# Patient Record
Sex: Female | Born: 1970
Health system: Southern US, Community
[De-identification: ages and names within clinical notes are randomized; demographics above are authoritative.]

## PROBLEM LIST (undated history)

## (undated) DIAGNOSIS — E785 Hyperlipidemia, unspecified: Secondary | ICD-10-CM

## (undated) DIAGNOSIS — F419 Anxiety disorder, unspecified: Secondary | ICD-10-CM

## (undated) DIAGNOSIS — R0602 Shortness of breath: Secondary | ICD-10-CM

## (undated) DIAGNOSIS — G8929 Other chronic pain: Secondary | ICD-10-CM

## (undated) DIAGNOSIS — D649 Anemia, unspecified: Secondary | ICD-10-CM

## (undated) DIAGNOSIS — F319 Bipolar disorder, unspecified: Secondary | ICD-10-CM

## (undated) DIAGNOSIS — K219 Gastro-esophageal reflux disease without esophagitis: Secondary | ICD-10-CM

## (undated) DIAGNOSIS — G43909 Migraine, unspecified, not intractable, without status migrainosus: Secondary | ICD-10-CM

## (undated) DIAGNOSIS — G5603 Carpal tunnel syndrome, bilateral upper limbs: Secondary | ICD-10-CM

## (undated) DIAGNOSIS — G4730 Sleep apnea, unspecified: Secondary | ICD-10-CM

## (undated) DIAGNOSIS — I739 Peripheral vascular disease, unspecified: Secondary | ICD-10-CM

## (undated) DIAGNOSIS — G4700 Insomnia, unspecified: Secondary | ICD-10-CM

## (undated) DIAGNOSIS — F488 Other specified nonpsychotic mental disorders: Secondary | ICD-10-CM

## (undated) DIAGNOSIS — G4733 Obstructive sleep apnea (adult) (pediatric): Secondary | ICD-10-CM

## (undated) DIAGNOSIS — M797 Fibromyalgia: Secondary | ICD-10-CM

## (undated) DIAGNOSIS — M1990 Unspecified osteoarthritis, unspecified site: Secondary | ICD-10-CM

## (undated) DIAGNOSIS — H269 Unspecified cataract: Secondary | ICD-10-CM

## (undated) DIAGNOSIS — I5023 Acute on chronic systolic (congestive) heart failure: Secondary | ICD-10-CM

## (undated) DIAGNOSIS — F329 Major depressive disorder, single episode, unspecified: Secondary | ICD-10-CM

## (undated) DIAGNOSIS — S21002A Unspecified open wound of left breast, initial encounter: Secondary | ICD-10-CM

## (undated) DIAGNOSIS — I10 Essential (primary) hypertension: Secondary | ICD-10-CM

## (undated) DIAGNOSIS — Z9884 Bariatric surgery status: Secondary | ICD-10-CM

## (undated) DIAGNOSIS — F32A Depression, unspecified: Secondary | ICD-10-CM

## (undated) DIAGNOSIS — G629 Polyneuropathy, unspecified: Secondary | ICD-10-CM

## (undated) DIAGNOSIS — I428 Other cardiomyopathies: Secondary | ICD-10-CM

## (undated) DIAGNOSIS — M549 Dorsalgia, unspecified: Secondary | ICD-10-CM

## (undated) HISTORY — DX: Unspecified cataract: H26.9

## (undated) HISTORY — DX: Hyperlipidemia, unspecified: E78.5

## (undated) HISTORY — PX: BREAST CYST ASPIRATION: SHX578

## (undated) HISTORY — PX: CARPAL TUNNEL RELEASE: SHX101

## (undated) HISTORY — DX: Obstructive sleep apnea (adult) (pediatric): G47.33

## (undated) HISTORY — PX: BREAST SURGERY: SHX581

## (undated) HISTORY — DX: Unspecified osteoarthritis, unspecified site: M19.90

## (undated) HISTORY — DX: Sleep apnea, unspecified: G47.30

## (undated) HISTORY — DX: Migraine, unspecified, not intractable, without status migrainosus: G43.909

## (undated) HISTORY — PX: CARDIAC CATHETERIZATION: SHX172

## (undated) HISTORY — DX: Gastro-esophageal reflux disease without esophagitis: K21.9

## (undated) HISTORY — PX: CATARACT EXTRACTION: SUR2

---

## 1991-04-16 HISTORY — PX: BACK SURGERY: SHX140

## 1996-06-15 HISTORY — PX: CHOLECYSTECTOMY: SHX55

## 1997-06-15 HISTORY — PX: TUBAL LIGATION: SHX77

## 1997-11-16 ENCOUNTER — Inpatient Hospital Stay (HOSPITAL_COMMUNITY): Admission: AD | Admit: 1997-11-16 | Discharge: 1997-11-18 | Payer: Self-pay | Admitting: Family Medicine

## 1997-11-27 ENCOUNTER — Emergency Department (HOSPITAL_COMMUNITY): Admission: EM | Admit: 1997-11-27 | Discharge: 1997-11-27 | Payer: Self-pay | Admitting: Emergency Medicine

## 1997-11-30 ENCOUNTER — Ambulatory Visit (HOSPITAL_COMMUNITY): Admission: RE | Admit: 1997-11-30 | Discharge: 1997-11-30 | Payer: Self-pay | Admitting: Family Medicine

## 1997-12-04 ENCOUNTER — Ambulatory Visit (HOSPITAL_COMMUNITY): Admission: RE | Admit: 1997-12-04 | Discharge: 1997-12-04 | Payer: Self-pay | Admitting: Family Medicine

## 1997-12-06 ENCOUNTER — Encounter: Admission: RE | Admit: 1997-12-06 | Discharge: 1997-12-06 | Payer: Self-pay | Admitting: Family Medicine

## 1997-12-12 ENCOUNTER — Other Ambulatory Visit: Admission: RE | Admit: 1997-12-12 | Discharge: 1997-12-12 | Payer: Self-pay | Admitting: Family Medicine

## 1998-01-30 ENCOUNTER — Ambulatory Visit (HOSPITAL_COMMUNITY): Admission: RE | Admit: 1998-01-30 | Discharge: 1998-01-31 | Payer: Self-pay | Admitting: *Deleted

## 1998-03-13 ENCOUNTER — Encounter: Admission: RE | Admit: 1998-03-13 | Discharge: 1998-06-11 | Payer: Self-pay | Admitting: Internal Medicine

## 1998-07-17 ENCOUNTER — Ambulatory Visit (HOSPITAL_COMMUNITY): Admission: RE | Admit: 1998-07-17 | Discharge: 1998-07-17 | Payer: Self-pay | Admitting: *Deleted

## 1999-01-16 ENCOUNTER — Other Ambulatory Visit: Admission: RE | Admit: 1999-01-16 | Discharge: 1999-01-16 | Payer: Self-pay | Admitting: Family Medicine

## 2000-03-04 ENCOUNTER — Other Ambulatory Visit: Admission: RE | Admit: 2000-03-04 | Discharge: 2000-03-04 | Payer: Self-pay | Admitting: Family Medicine

## 2000-03-08 ENCOUNTER — Encounter: Payer: Self-pay | Admitting: Emergency Medicine

## 2000-03-08 ENCOUNTER — Emergency Department (HOSPITAL_COMMUNITY): Admission: EM | Admit: 2000-03-08 | Discharge: 2000-03-08 | Payer: Self-pay | Admitting: Emergency Medicine

## 2000-08-08 ENCOUNTER — Emergency Department (HOSPITAL_COMMUNITY): Admission: EM | Admit: 2000-08-08 | Discharge: 2000-08-08 | Payer: Self-pay | Admitting: Emergency Medicine

## 2001-03-10 ENCOUNTER — Inpatient Hospital Stay (HOSPITAL_COMMUNITY): Admission: AD | Admit: 2001-03-10 | Discharge: 2001-03-11 | Payer: Self-pay | Admitting: Family Medicine

## 2001-04-05 ENCOUNTER — Other Ambulatory Visit: Admission: RE | Admit: 2001-04-05 | Discharge: 2001-04-05 | Payer: Self-pay | Admitting: Family Medicine

## 2001-06-23 ENCOUNTER — Emergency Department (HOSPITAL_COMMUNITY): Admission: EM | Admit: 2001-06-23 | Discharge: 2001-06-24 | Payer: Self-pay | Admitting: Emergency Medicine

## 2001-07-14 ENCOUNTER — Emergency Department (HOSPITAL_COMMUNITY): Admission: EM | Admit: 2001-07-14 | Discharge: 2001-07-14 | Payer: Self-pay | Admitting: Emergency Medicine

## 2001-12-19 ENCOUNTER — Emergency Department (HOSPITAL_COMMUNITY): Admission: EM | Admit: 2001-12-19 | Discharge: 2001-12-19 | Payer: Self-pay

## 2001-12-22 ENCOUNTER — Emergency Department (HOSPITAL_COMMUNITY): Admission: EM | Admit: 2001-12-22 | Discharge: 2001-12-22 | Payer: Self-pay | Admitting: Emergency Medicine

## 2002-02-02 ENCOUNTER — Ambulatory Visit (HOSPITAL_COMMUNITY): Admission: RE | Admit: 2002-02-02 | Discharge: 2002-02-02 | Payer: Self-pay | Admitting: *Deleted

## 2002-03-20 ENCOUNTER — Encounter: Payer: Self-pay | Admitting: Emergency Medicine

## 2002-03-20 ENCOUNTER — Emergency Department (HOSPITAL_COMMUNITY): Admission: EM | Admit: 2002-03-20 | Discharge: 2002-03-20 | Payer: Self-pay | Admitting: Emergency Medicine

## 2011-01-20 NOTE — Progress Notes (Deleted)
  Patient was seen on 01/21/11 for the first of a series of three diabetes self-management courses at [facility]. The following learning objectives were met by the patient during this course:   Defines diabetes and the role of insulin  Identifies type of diabetes and pathophysiology  States normal BG range and personal goals  Identifies three risk factors for the development of diabetes  States the need for and frequency of healthcare follow up (ADA Standards of Care)  No results found for this basename: HGBA1C    Patient has established the following initial goals:  ***  ***  ***  Follow-Up Plan: ***  

## 2011-02-02 ENCOUNTER — Encounter: Payer: 59 | Attending: Family Medicine | Admitting: *Deleted

## 2011-02-02 DIAGNOSIS — E119 Type 2 diabetes mellitus without complications: Secondary | ICD-10-CM | POA: Insufficient documentation

## 2011-02-02 DIAGNOSIS — Z713 Dietary counseling and surveillance: Secondary | ICD-10-CM | POA: Insufficient documentation

## 2011-02-03 NOTE — Patient Instructions (Signed)
Patient will attend Core Diabetes Courses as scheduled or follow up prn.  

## 2011-02-03 NOTE — Progress Notes (Signed)
  Patient was seen on 02/02/11 for the first of a series of three diabetes self-management courses at the [facility]. The following learning objectives were met by the patient during this course:   Defines diabetes and the role of insulin  Identifies type of diabetes and pathophysiology  States normal BG range and personal goals  Identifies three risk factors for the development of diabetes  States the need for and frequency of healthcare follow up (ADA Standards of Care)   Patient has established the following initial goals:  Increase exercise  Lose weight  Follow DM meal plan  Follow-Up Plan: Attend core diabetes classes @ [facility]

## 2011-02-24 ENCOUNTER — Ambulatory Visit: Payer: Medicaid Other

## 2011-02-25 ENCOUNTER — Other Ambulatory Visit: Payer: Self-pay | Admitting: Neurosurgery

## 2011-02-25 DIAGNOSIS — M549 Dorsalgia, unspecified: Secondary | ICD-10-CM

## 2011-02-25 DIAGNOSIS — M542 Cervicalgia: Secondary | ICD-10-CM

## 2011-02-25 DIAGNOSIS — M5410 Radiculopathy, site unspecified: Secondary | ICD-10-CM

## 2011-02-26 ENCOUNTER — Encounter: Payer: Medicare PPO | Attending: Family Medicine

## 2011-02-26 DIAGNOSIS — Z713 Dietary counseling and surveillance: Secondary | ICD-10-CM | POA: Insufficient documentation

## 2011-02-26 DIAGNOSIS — E119 Type 2 diabetes mellitus without complications: Secondary | ICD-10-CM | POA: Insufficient documentation

## 2011-02-27 NOTE — Progress Notes (Signed)
  Patient was seen on 02/26/11 for the second of a series of three diabetes self-management courses at the [facility]. The following learning objectives were met by the patient during this course:   States the relationship of exercise to blood glucose  States benefits/barriers of regular and safe exercise  States three guidelines for safe and effective exercise  Describes personal diabetes medicine regimen  Describes actions of own medications  Describes causes, symptoms, and treatment of hypo/hyperglycemia  Describes sick day rules  Identifies when to test urine for ketones when appropriate  Identifies when to call healthcare provider for acute complications  States the risk for problems with foot, skin, and dental care  States preventative foot, skin, and dental care measures  States when to call healthcare provider regarding foot, skin, and dental care  Identifies methods for evaluation of diabetes control  Discusses benefits of SBGM  Identifies relationship between nutrition, exercise, medication, and glucose levels  Discusses the importance of record keeping  *Patient received [facility] Core Program Notebook at class.  Follow-Up Plan: Patient will attend the final class of the ADA Diabetes Self-Care Education.   

## 2011-03-02 ENCOUNTER — Ambulatory Visit
Admission: RE | Admit: 2011-03-02 | Discharge: 2011-03-02 | Disposition: A | Discharge status: Home / Self Care | Payer: Medicaid Other | Source: Ambulatory Visit | Attending: Neurosurgery | Admitting: Neurosurgery

## 2011-03-02 ENCOUNTER — Ambulatory Visit
Admission: RE | Admit: 2011-03-02 | Discharge: 2011-03-02 | Disposition: A | Discharge status: Home / Self Care | Payer: 59 | Source: Ambulatory Visit | Attending: Neurosurgery | Admitting: Neurosurgery

## 2011-03-02 DIAGNOSIS — M549 Dorsalgia, unspecified: Secondary | ICD-10-CM

## 2011-03-02 DIAGNOSIS — M542 Cervicalgia: Secondary | ICD-10-CM

## 2011-03-02 DIAGNOSIS — M5410 Radiculopathy, site unspecified: Secondary | ICD-10-CM

## 2011-03-02 MED ORDER — ONDANSETRON HCL 4 MG/2ML IJ SOLN
4.0000 mg | Freq: Once | INTRAMUSCULAR | Status: AC
  Administered 2011-03-02: 4 mg via INTRAMUSCULAR

## 2011-03-02 MED ORDER — IOHEXOL 300 MG/ML  SOLN
9.0000 mL | Freq: Once | INTRAMUSCULAR | Status: AC | PRN
  Administered 2011-03-02: 9 mL via INTRATHECAL

## 2011-03-02 MED ORDER — MEPERIDINE HCL 100 MG/ML IJ SOLN
75.0000 mg | Freq: Once | INTRAMUSCULAR | Status: AC
  Administered 2011-03-02: 75 mg via INTRAMUSCULAR

## 2011-03-02 MED ORDER — DIAZEPAM 2 MG PO TABS
10.0000 mg | ORAL_TABLET | Freq: Once | ORAL | Status: AC
  Administered 2011-03-02: 10 mg via ORAL

## 2011-03-03 ENCOUNTER — Ambulatory Visit: Payer: Medicaid Other

## 2011-03-03 ENCOUNTER — Telehealth: Payer: Self-pay | Admitting: Diagnostic Radiology

## 2011-03-03 NOTE — Telephone Encounter (Signed)
Pt called to c/o backache, headache and nausea post myelo. Explained she needed to continue bedrest, try benadryl or dramamine for nausea, ginger ale and soda crackers as well. Use ice to her back for discomfort and may start taking tramadol later today.

## 2011-03-05 ENCOUNTER — Encounter: Payer: Medicare PPO | Admitting: *Deleted

## 2011-03-05 NOTE — Progress Notes (Signed)
  Patient was seen on 03/05/2011 for the third of a series of three diabetes self-management courses at the [facility]. The following learning objectives were met by the patient during this course:   Identifies nutrient effects on glycemia  States the general guidelines of meal planning  Relates understanding of personal meal plan  Describes situations that cause stress and discuss methods of stress management  Identifies lifestyle behaviors for change  The following handouts were given in class:  [employer] Carbohydrate Counting book  3 Month Follow Up Visit handout  Goal setting handout  Class evaluation form  Your patient has established the following 3 month goals for diabetes self-care:  Eat meals and snacks on time  Walk/bike/swim/aerobic exercise for 30 minutes 3 times / week    Follow-Up Plan: Patient will attend a 3 month follow-up visit for diabetes self-management education.

## 2011-03-20 ENCOUNTER — Other Ambulatory Visit (INDEPENDENT_AMBULATORY_CARE_PROVIDER_SITE_OTHER): Payer: Self-pay | Admitting: General Surgery

## 2011-03-20 ENCOUNTER — Encounter (INDEPENDENT_AMBULATORY_CARE_PROVIDER_SITE_OTHER): Payer: Self-pay | Admitting: General Surgery

## 2011-03-20 ENCOUNTER — Ambulatory Visit (INDEPENDENT_AMBULATORY_CARE_PROVIDER_SITE_OTHER): Payer: 59 | Admitting: General Surgery

## 2011-03-20 DIAGNOSIS — E118 Type 2 diabetes mellitus with unspecified complications: Secondary | ICD-10-CM

## 2011-03-20 DIAGNOSIS — E1165 Type 2 diabetes mellitus with hyperglycemia: Secondary | ICD-10-CM

## 2011-03-20 DIAGNOSIS — M549 Dorsalgia, unspecified: Secondary | ICD-10-CM

## 2011-03-20 DIAGNOSIS — F329 Major depressive disorder, single episode, unspecified: Secondary | ICD-10-CM

## 2011-03-20 DIAGNOSIS — J45909 Unspecified asthma, uncomplicated: Secondary | ICD-10-CM

## 2011-03-20 DIAGNOSIS — K219 Gastro-esophageal reflux disease without esophagitis: Secondary | ICD-10-CM

## 2011-03-20 LAB — CBC WITH DIFFERENTIAL/PLATELET
Lymphocytes Relative: 25 % (ref 12–46)
Lymphs Abs: 2.2 10*3/uL (ref 0.7–4.0)
MCV: 82.9 fL (ref 78.0–100.0)
Neutrophils Relative %: 67 % (ref 43–77)
Platelets: 343 10*3/uL (ref 150–400)
RBC: 4.45 MIL/uL (ref 3.87–5.11)
WBC: 8.6 10*3/uL (ref 4.0–10.5)

## 2011-03-20 LAB — COMPREHENSIVE METABOLIC PANEL
ALT: 51 U/L — ABNORMAL HIGH (ref 0–35)
CO2: 25 mEq/L (ref 19–32)
Calcium: 10.3 mg/dL (ref 8.4–10.5)
Chloride: 99 mEq/L (ref 96–112)
Sodium: 137 mEq/L (ref 135–145)
Total Protein: 7.6 g/dL (ref 6.0–8.3)

## 2011-03-20 LAB — T4: T4, Total: 10.6 ug/dL (ref 5.0–12.5)

## 2011-03-20 LAB — LIPID PANEL: LDL Cholesterol: 117 mg/dL — ABNORMAL HIGH (ref 0–99)

## 2011-03-20 NOTE — Patient Instructions (Signed)
Call for any questions regarding her preoperative evaluation

## 2011-03-20 NOTE — Progress Notes (Signed)
Subjective:   Morbid obesity  Patient ID: [name], female   DOB: 1970/09/09, 40 y.o.   MRN: [id number]  HPI The patient is a 40 year old female here through the courtesy of Dr. Nathanial Rancher or consideration for surgical treatment for morbid obesity. The patient gives a many year history of struggling with her weight. She describes multiple efforts at nonsurgical weight loss including diet exercise and medications. She has had some temporary success of 30 pounds at a time but then will experience weight regain. She has developed multiple significant medical problems related to her weight the most significant of which is insulin-dependent diabetes which has been brittle and difficult to control. She is very concerned about her long-term health and being around for her son. She has been to our initial information seminar and researched her options and is interested in gastric bypass.  Past Medical History  Diagnosis Date  . Arthritis   . Asthma   . GERD (gastroesophageal reflux disease)   . Diabetes mellitus   . Hyperlipidemia   . Migraines    Past Surgical History  Procedure Date  . Back surgery November 1992  . Cesarean section December 1998  . Tubal ligation January 1999  . Cholecystectomy 1998   Current Outpatient Prescriptions  Medication Sig Dispense Refill  . albuterol (PROVENTIL) (2.5 MG/3ML) 0.083% nebulizer solution Take 2.5 mg by nebulization every 6 (six) hours as needed.        . Alcohol Swabs (B-D SINGLE USE SWABS REGULAR) PADS       . baclofen (LIORESAL) 10 MG tablet Take 10 mg by mouth 3 (three) times daily as needed.        . BD ULTRA-FINE PEN NEEDLES 29G X 12.7MM MISC       . CLEVER CHEK TEST test strip       . cyclobenzaprine (FLEXERIL) 10 MG tablet Take 10 mg by mouth 3 (three) times daily as needed.        . DULoxetine (CYMBALTA) 60 MG capsule Take 60 mg by mouth daily.        Marland Kitchen exenatide (BYETTA) 5 MCG/0.02ML SOLN Inject into the skin 2 (two) times daily with a  meal.        . fish oil-omega-3 fatty acids 1000 MG capsule Take 2 g by mouth daily.        Marland Kitchen ibuprofen (ADVIL,MOTRIN) 800 MG tablet       . insulin glargine (LANTUS) 100 UNIT/ML injection Inject 75 Units into the skin at bedtime.        . Lancets MISC       . lisinopril-hydrochlorothiazide (PRINZIDE,ZESTORETIC) 20-12.5 MG per tablet Take 1 tablet by mouth daily.        Marland Kitchen LOVAZA 1 G capsule       . metroNIDAZOLE (FLAGYL) 500 MG tablet       . montelukast (SINGULAIR) 10 MG tablet Take 10 mg by mouth at bedtime.        . montelukast (SINGULAIR) 10 MG tablet       . omeprazole (PRILOSEC) 10 MG capsule Take 10 mg by mouth daily.        . phentermine 37.5 MG capsule Take 37.5 mg by mouth every morning.        . potassium chloride (K-DUR,KLOR-CON) 10 MEQ tablet Take 10 mEq by mouth 2 (two) times daily.        . potassium chloride (KLOR-CON) 10 MEQ CR tablet Take 10 mEq by mouth daily.        Marland Kitchen  traMADol (ULTRAM) 50 MG tablet        No Known Allergies History  Substance Use Topics  . Smoking status: Never Smoker   . Smokeless tobacco: Never Used  . Alcohol Use: No    Review of Systems  Constitutional: Negative.   HENT: Negative.   Eyes: Positive for visual disturbance.  Respiratory: Positive for cough, choking and shortness of breath.   Cardiovascular: Negative.   Gastrointestinal: Negative for nausea, vomiting, abdominal pain, diarrhea and constipation.  Genitourinary: Negative for difficulty urinating.  Musculoskeletal: Positive for back pain and arthralgias.  Neurological:       Neuropathy feet  Psychiatric/Behavioral: Negative.        Objective:   Physical Exam General: Obese African American female in no acute distress Skin: Warm and dry without rash or infection HEENT: No palpable masses or thyromegaly. Pupils equal. Sclerae nonicteric. Oropharynx clear. Lymph nodes: The cervical, supraclavicular nodes palpable Lungs: Clear without wheezing or increased work of  breathing Cardiovascular: Regular rate and rhythm without murmur. No edema. Peripheral pulses intact. Abdomen: Obese, soft and nontender. Well-healed laparoscopic incisions. No hernias. No discernible masses or organomegaly. Extremities:: Some mild atrophy of the skin the lower extremities. No edema or joint deformity. Neurologic: Alert, fulgurated, gait normal.    Assessment:     40 year old female with morbid obesity unresponsive to multiple efforts at medical management. She presents at a BMI of 36.9 and multiple comorbidities including brittle insulin-dependent diabetes mellitus, chronic back and joint pain, hyperlipidemia and GERD. Please she has significant potential benefits of from weight loss surgery. I believe that gastric bypass would be preferable for her due to her brittle diabetes and this is the operation that she had chosen on her own. We discussed the procedure in detail including expected results, risks of bleeding leak, infection, blood clots, and rare risk of death. We discussed long-term risks of stenosis, ulcer, intestinal blockage, nutritional deficiencies. She was given a complete consent form reviewed.    Plan:     We'll proceed with preoperative cytologic and nutrition evaluation, blood work, H. pylori testing. See her back following these studies.

## 2011-03-21 LAB — HEMOGLOBIN A1C: Hgb A1c MFr Bld: 12.3 % — ABNORMAL HIGH (ref <5.7)

## 2011-03-30 ENCOUNTER — Encounter: Payer: Medicare PPO | Attending: Family Medicine | Admitting: *Deleted

## 2011-03-30 ENCOUNTER — Encounter: Payer: Self-pay | Admitting: *Deleted

## 2011-03-30 DIAGNOSIS — E119 Type 2 diabetes mellitus without complications: Secondary | ICD-10-CM | POA: Insufficient documentation

## 2011-03-30 DIAGNOSIS — Z713 Dietary counseling and surveillance: Secondary | ICD-10-CM | POA: Insufficient documentation

## 2011-03-30 NOTE — Patient Instructions (Signed)
   Follow Pre-Op Nutrition Goals to prepare for Gastric Bypass Surgery.   Call the [facility] at [phone] once you have been given your surgery date to enrolled in the Pre-Op Nutrition Class. You will need to attend this nutrition class 3-4 weeks prior to your surgery. 

## 2011-03-30 NOTE — Progress Notes (Signed)
  Patient was seen on 03/30/2011 from 0905 to 1002 for Pre-Operative Gastric Bypass Nutrition Assessment. Assessment and letter of approval faxed to [facility] coordinator on 03/30/2011 and sent to the [facility] for documentation in the chart.    Handouts given during visit include:  Pre-Op Goals Handout  Patient to call for Pre-Op and Post-Op Nutrition Education at the [facility] when surgery is scheduled.

## 2011-03-31 ENCOUNTER — Ambulatory Visit (HOSPITAL_COMMUNITY)
Admission: RE | Admit: 2011-03-31 | Discharge: 2011-03-31 | Disposition: A | Discharge status: Home / Self Care | Payer: Medicare PPO | Source: Ambulatory Visit | Attending: General Surgery | Admitting: General Surgery

## 2011-03-31 ENCOUNTER — Other Ambulatory Visit: Payer: Self-pay

## 2011-03-31 ENCOUNTER — Ambulatory Visit (HOSPITAL_COMMUNITY)
Admission: RE | Admit: 2011-03-31 | Discharge: 2011-03-31 | Disposition: A | Discharge status: Home / Self Care | Payer: 59 | Source: Ambulatory Visit | Attending: General Surgery | Admitting: General Surgery

## 2011-03-31 DIAGNOSIS — Z01818 Encounter for other preprocedural examination: Secondary | ICD-10-CM | POA: Insufficient documentation

## 2011-03-31 DIAGNOSIS — Z13820 Encounter for screening for osteoporosis: Secondary | ICD-10-CM | POA: Insufficient documentation

## 2011-05-22 ENCOUNTER — Other Ambulatory Visit (INDEPENDENT_AMBULATORY_CARE_PROVIDER_SITE_OTHER): Payer: Self-pay | Admitting: General Surgery

## 2011-06-03 NOTE — Patient Instructions (Signed)
Follow:    Pre-Op Diet per MD 2 weeks prior to surgery  Phase 2- Liquids (clear/full) 2 weeks after surgery  Vitamin/Mineral/Calcium guidelines for purchasing bariatric supplements  Exercise guidelines pre and post-op per MD  Follow-up at [facility] in 2 weeks post-op for diet advancement. Contact Ramaya Guile as needed with questions/concerns.  

## 2011-06-03 NOTE — Progress Notes (Signed)
  Bariatric Class:  Appt start time: 1700 end time:  1800.  Pre-Operative Nutrition Class  Patient was seen on 06/04/11 for Pre-Operative Nutrition education at the [facility].   Surgery date: 06/30/11 Surgery type: Gastric Bypass  Weight today: 223.8lb Weight change: 1.7 lb gain Total weight lost: n/a BMI: 36.7  Samples given per MNT protocol:  Bariatric Advantage Multivitamin  Lot # [id number]  Exp: 9/13   Bariatric Advantage Calcium Citrate  Lot # [id number]  Exp: 12/13   Celebrate VitaminsCalcium Citrate  Lot # [id number]  Exp: 4/13   Unjury Protein - Chicken Soup  Lot # [id number]  Exp: 8/13   The following the learning objective met by the patient during this course:   Identifies Pre-Op Dietary Goals and will begin 2 weeks pre-operatively   Identifies appropriate sources of fluids and proteins   States protein recommendations and appropriate sources pre and post-operatively  Identifies Post-Operative Dietary Goals and will follow for 2 weeks post-operatively  Identifies appropriate multivitamin and calcium sources  Describes the need for physical activity post-operatively and will follow MD recommendations  States when to call healthcare provider regarding medication questions or post-operative complications  Handouts given during class include:  Pre-Op Bariatric Surgery Diet Handout  Protein Shake Handout  Post-Op Bariatric Surgery Nutrition Handout  BELT Program Information Flyer  Support Group Information Flyer  Follow-Up Plan: Patient will follow-up at [facility] 2 weeks post operatively for diet advancement per MD.

## 2011-06-04 ENCOUNTER — Encounter: Payer: Self-pay | Admitting: *Deleted

## 2011-06-04 ENCOUNTER — Encounter: Payer: Medicare HMO | Attending: Family Medicine | Admitting: *Deleted

## 2011-06-04 ENCOUNTER — Ambulatory Visit: Payer: 59 | Admitting: *Deleted

## 2011-06-04 DIAGNOSIS — Z713 Dietary counseling and surveillance: Secondary | ICD-10-CM | POA: Insufficient documentation

## 2011-06-04 DIAGNOSIS — E119 Type 2 diabetes mellitus without complications: Secondary | ICD-10-CM | POA: Insufficient documentation

## 2011-06-11 ENCOUNTER — Encounter (HOSPITAL_COMMUNITY): Payer: Self-pay | Admitting: Pharmacy Technician

## 2011-06-16 DIAGNOSIS — Z9884 Bariatric surgery status: Secondary | ICD-10-CM

## 2011-06-16 HISTORY — DX: Bariatric surgery status: Z98.84

## 2011-06-17 ENCOUNTER — Encounter (HOSPITAL_COMMUNITY)
Admission: RE | Admit: 2011-06-17 | Discharge: 2011-06-17 | Disposition: A | Discharge status: Home / Self Care | Payer: Medicare HMO | Source: Ambulatory Visit | Attending: General Surgery | Admitting: General Surgery

## 2011-06-17 ENCOUNTER — Encounter (HOSPITAL_COMMUNITY): Payer: Self-pay

## 2011-06-17 HISTORY — DX: Other chronic pain: G89.29

## 2011-06-17 HISTORY — DX: Dorsalgia, unspecified: M54.9

## 2011-06-17 HISTORY — DX: Polyneuropathy, unspecified: G62.9

## 2011-06-17 HISTORY — DX: Essential (primary) hypertension: I10

## 2011-06-17 HISTORY — DX: Depression, unspecified: F32.A

## 2011-06-17 HISTORY — DX: Major depressive disorder, single episode, unspecified: F32.9

## 2011-06-17 LAB — COMPREHENSIVE METABOLIC PANEL
ALT: 50 U/L — ABNORMAL HIGH (ref 0–35)
AST: 69 U/L — ABNORMAL HIGH (ref 0–37)
CO2: 27 mEq/L (ref 19–32)
Chloride: 100 mEq/L (ref 96–112)
GFR calc non Af Amer: >90 mL/min (ref >90)
Sodium: 136 mEq/L (ref 135–145)
Total Bilirubin: 0.2 mg/dL — ABNORMAL LOW (ref 0.3–1.2)

## 2011-06-17 LAB — CBC
Platelets: 346 10*3/uL (ref 150–400)
RBC: 4.23 MIL/uL (ref 3.87–5.11)
WBC: 7.6 10*3/uL (ref 4.0–10.5)

## 2011-06-17 LAB — DIFFERENTIAL
Basophils Absolute: 0 10*3/uL (ref 0.0–0.1)
Basophils Relative: 0 % (ref 0–1)
Eosinophils Relative: 4 % (ref 0–5)
Monocytes Absolute: 0.4 10*3/uL (ref 0.1–1.0)

## 2011-06-17 NOTE — Patient Instructions (Addendum)
20 JAYLI FOGLEMAN  06/17/2011   Your procedure is scheduled on:  06/30/11  Report to [facility] at 5:00 AM.  Call this number if you have problems the morning of surgery: [phone]   Remember:   Do not eat food:After Midnight.  May have clear liquids:until Midnight .  Clear liquids include soda, tea, black coffee, apple or grape juice, broth.  Take these medicines the morning of surgery with A SIP OF WATER: CYMBALTA / SINGULAIR / OMEPRAZOLE / HYDROCODONE IF              NEEDED    Do not wear jewelry, make-up or nail polish.  Do not wear lotions, powders, or perfumes. You may wear deodorant.  Do not shave 48 hours prior to surgery.  Do not bring valuables to the hospital.  Contacts, dentures or bridgework may not be worn into surgery.  Leave suitcase in the car. After surgery it may be brought to your room.  For patients admitted to the hospital, checkout time is 11:00 AM the day of discharge.   Patients discharged the day of surgery will not be allowed to drive home.  Name and phone number of your driver:   Special Instructions: CHG Shower Use Special Wash: 1/2 bottle night before surgery and 1/2 bottle morning of surgery.   Please read over the following fact sheets that you were given: MRSA Information             Follow bowel prep from office             Stop aspirin / herbs / IBUPROFEN / ADVIL / Armond Hang

## 2011-06-25 ENCOUNTER — Ambulatory Visit (INDEPENDENT_AMBULATORY_CARE_PROVIDER_SITE_OTHER): Payer: Medicare HMO | Admitting: General Surgery

## 2011-06-25 ENCOUNTER — Encounter (INDEPENDENT_AMBULATORY_CARE_PROVIDER_SITE_OTHER): Payer: Self-pay | Admitting: General Surgery

## 2011-06-25 DIAGNOSIS — IMO0001 Reserved for inherently not codable concepts without codable children: Secondary | ICD-10-CM | POA: Insufficient documentation

## 2011-06-25 DIAGNOSIS — IMO0002 Reserved for concepts with insufficient information to code with codable children: Secondary | ICD-10-CM | POA: Insufficient documentation

## 2011-06-25 MED ORDER — PEG 3350-KCL-NA BICARB-NACL 420 G PO SOLR: 4000.0000 mL | Freq: Once | ORAL | Status: AC

## 2011-06-25 NOTE — Progress Notes (Signed)
Subjective:   Morbid obesity  Patient ID: [name], female   DOB: 11-11-1970, 41 y.o.   MRN: [id number]  HPI Patient returns to the office prior to planned laparoscopic Roux-en-Y gastric bypass. She has a history of obesity unresponsive to medical management and multiple comorbidities including severe insulin-dependent diabetes mellitus, asthma, degenerative disc disease, asthma and hyperlipidemia. She has successfully completed her reactive workup. There were no concerns on-site nutritional evaluation. We reviewed her chest x-ray, bone density and lab work which were all okay except for findings related to her diabetes  Past Medical History  Diagnosis Date  . Arthritis   . Asthma   . GERD (gastroesophageal reflux disease)   . Diabetes mellitus   . Hyperlipidemia   . Migraines   . Hypertension   . Back pain, chronic   . Depression   . Neuropathy    Past Surgical History  Procedure Date  . Back surgery November 1992  . Cesarean section December 1998  . Tubal ligation January 1999  . Cholecystectomy 1998   Current Outpatient Prescriptions  Medication Sig Dispense Refill  . albuterol (PROVENTIL HFA;VENTOLIN HFA) 108 (90 BASE) MCG/ACT inhaler Inhale 2 puffs into the lungs every 6 (six) hours as needed. WHEEZING       . Alcohol Swabs (B-D SINGLE USE SWABS REGULAR) PADS       . baclofen (LIORESAL) 10 MG tablet Take 10 mg by mouth 3 (three) times daily as needed. PAIN       . BD ULTRA-FINE PEN NEEDLES 29G X 12.7MM MISC       . CLEVER CHEK TEST test strip       . cyclobenzaprine (FLEXERIL) 10 MG tablet Take 10 mg by mouth 3 (three) times daily as needed. MUSCLE SPASM       . DULoxetine (CYMBALTA) 60 MG capsule Take 60 mg by mouth 2 (two) times daily.       Marland Kitchen exenatide (BYETTA) 5 MCG/0.02ML SOLN Inject into the skin 2 (two) times daily with a meal.       . fish oil-omega-3 fatty acids 1000 MG capsule Take 1 g by mouth 2 (two) times daily.       Marland Kitchen HYDROcodone-acetaminophen  (NORCO) 5-325 MG per tablet Take 1 tablet by mouth every 6 (six) hours as needed. PAIN       . insulin glargine (LANTUS) 100 UNIT/ML injection Inject 120 Units into the skin every morning.       . Lancets MISC       . lisinopril-hydrochlorothiazide (PRINZIDE,ZESTORETIC) 20-12.5 MG per tablet Take 1 tablet by mouth every morning.       . metroNIDAZOLE (FLAGYL) 500 MG tablet Take 500 mg by mouth 2 (two) times daily.       . montelukast (SINGULAIR) 10 MG tablet Take 10 mg by mouth daily before breakfast.       . omeprazole (PRILOSEC) 40 MG capsule Take 40 mg by mouth daily.       . potassium chloride (K-DUR,KLOR-CON) 10 MEQ tablet Take 10 mEq by mouth daily.        No Known Allergies History  Substance Use Topics  . Smoking status: Never Smoker   . Smokeless tobacco: Never Used  . Alcohol Use: No    Review of Systems  HENT: Negative.   Eyes: Positive for visual disturbance.  Respiratory: Negative.   Cardiovascular: Negative.   Gastrointestinal: Negative.   Musculoskeletal: Positive for back pain.       Objective:  Physical Exam BP 142/100  Pulse 72  Temp(Src) 97.6 F (36.4 C) (Temporal)  Resp 16  Ht 5\' 5"  (1.651 m)  Wt 221 lb 12.8 oz (100.608 kg)  BMI 36.91 kg/m2  LMP 05/26/2011 Body mass index is 36.91 kg/(m^2). General: Alert, obese African American female, in no distress Skin: Warm and dry without rash or infection. HEENT: No palpable masses or thyromegaly. Sclera nonicteric. Pupils equal round and reactive. Oropharynx clear. Lymph nodes: No cervical, supraclavicular, or inguinal nodes palpable. Lungs: Breath sounds clear and equal without increased work of breathing Cardiovascular: Regular rate and rhythm without murmur. No JVD or edema. Peripheral pulses intact. Abdomen: Nondistended. Soft and nontender. No masses palpable. No organomegaly. No palpable hernias. Extremities: No edema or joint swelling or deformity. No chronic venous stasis changes. Neurologic: Alert  and fully oriented. Gait normal.     Assessment:     Progressive morbid obesity unresponsive to medical management and multiple comorbidities including insulin-dependent diabetes mellitus, degenerative disc disease, GERD, hyperlipidemia    Plan:     For laparoscopic Roux-en-Y gastric bypass. We reviewed the consent form again today and all her questions were answered to her satisfaction.

## 2011-06-30 ENCOUNTER — Inpatient Hospital Stay (HOSPITAL_COMMUNITY): Payer: Medicare HMO | Admitting: Anesthesiology

## 2011-06-30 ENCOUNTER — Encounter (HOSPITAL_COMMUNITY): Payer: Self-pay | Admitting: Anesthesiology

## 2011-06-30 ENCOUNTER — Encounter (HOSPITAL_COMMUNITY): Payer: Self-pay | Admitting: *Deleted

## 2011-06-30 ENCOUNTER — Inpatient Hospital Stay (HOSPITAL_COMMUNITY)
Admission: RE | Admit: 2011-06-30 | Discharge: 2011-07-03 | DRG: 621 | Disposition: A | Discharge status: Home / Self Care | Payer: Medicare HMO | Source: Ambulatory Visit | Attending: General Surgery | Admitting: General Surgery

## 2011-06-30 ENCOUNTER — Encounter (HOSPITAL_COMMUNITY)
Admission: RE | Disposition: A | Discharge status: Home / Self Care | Payer: Self-pay | Source: Ambulatory Visit | Attending: General Surgery

## 2011-06-30 DIAGNOSIS — E119 Type 2 diabetes mellitus without complications: Secondary | ICD-10-CM

## 2011-06-30 DIAGNOSIS — G8929 Other chronic pain: Secondary | ICD-10-CM | POA: Diagnosis present

## 2011-06-30 DIAGNOSIS — M549 Dorsalgia, unspecified: Secondary | ICD-10-CM | POA: Diagnosis present

## 2011-06-30 DIAGNOSIS — Z01812 Encounter for preprocedural laboratory examination: Secondary | ICD-10-CM

## 2011-06-30 DIAGNOSIS — J45909 Unspecified asthma, uncomplicated: Secondary | ICD-10-CM

## 2011-06-30 DIAGNOSIS — I10 Essential (primary) hypertension: Secondary | ICD-10-CM | POA: Diagnosis present

## 2011-06-30 DIAGNOSIS — Z6837 Body mass index (BMI) 37.0-37.9, adult: Secondary | ICD-10-CM

## 2011-06-30 HISTORY — PX: GASTRIC ROUX-EN-Y: SHX5262

## 2011-06-30 LAB — CBC
HCT: 32.1 % — ABNORMAL LOW (ref 36.0–46.0)
Hemoglobin: 10.5 g/dL — ABNORMAL LOW (ref 12.0–15.0)
MCV: 82.3 fL (ref 78.0–100.0)
RDW: 13.6 % (ref 11.5–15.5)
WBC: 10.5 10*3/uL (ref 4.0–10.5)

## 2011-06-30 LAB — GLUCOSE, CAPILLARY: Glucose-Capillary: 85 mg/dL (ref 70–99)

## 2011-06-30 LAB — CREATININE, SERUM: GFR calc Af Amer: >90 mL/min (ref >90)

## 2011-06-30 LAB — HEMOGLOBIN AND HEMATOCRIT, BLOOD: Hemoglobin: 10.9 g/dL — ABNORMAL LOW (ref 12.0–15.0)

## 2011-06-30 SURGERY — LAPAROSCOPIC ROUX-EN-Y GASTRIC
Anesthesia: General | Site: Abdomen | Wound class: Clean Contaminated

## 2011-06-30 MED ORDER — HYDROMORPHONE HCL PF 1 MG/ML IJ SOLN
INTRAMUSCULAR | Status: DC | PRN
  Administered 2011-06-30: 0.5 mg via INTRAVENOUS

## 2011-06-30 MED ORDER — ACETAMINOPHEN 160 MG/5ML PO SOLN
650.0000 mg | ORAL | Status: DC | PRN
  Administered 2011-07-02: 650 mg via ORAL
  Filled 2011-06-30: qty 20.3

## 2011-06-30 MED ORDER — HEPARIN SODIUM (PORCINE) 5000 UNIT/ML IJ SOLN
5000.0000 [IU] | INTRAMUSCULAR | Status: AC
  Administered 2011-06-30: 5000 [IU] via SUBCUTANEOUS

## 2011-06-30 MED ORDER — TISSEEL VH 10 ML EX KIT
PACK | CUTANEOUS | Status: DC | PRN
  Administered 2011-06-30: 10 mL

## 2011-06-30 MED ORDER — 0.9 % SODIUM CHLORIDE (POUR BTL) OPTIME
TOPICAL | Status: DC | PRN
  Administered 2011-06-30: 1000 mL

## 2011-06-30 MED ORDER — ONDANSETRON HCL 4 MG/2ML IJ SOLN: 4.0000 mg | INTRAMUSCULAR | Status: DC | PRN

## 2011-06-30 MED ORDER — DEXTROSE 5 % IV SOLN
2.0000 g | INTRAVENOUS | Status: AC
  Administered 2011-06-30: 2 g via INTRAVENOUS
  Filled 2011-06-30: qty 2

## 2011-06-30 MED ORDER — DEXAMETHASONE SODIUM PHOSPHATE 10 MG/ML IJ SOLN
INTRAMUSCULAR | Status: DC | PRN
  Administered 2011-06-30: 10 mg via INTRAVENOUS

## 2011-06-30 MED ORDER — FIBRIN SEALANT COMPONENT 5 ML EX KIT
PACK | CUTANEOUS | Status: AC
  Filled 2011-06-30: qty 4

## 2011-06-30 MED ORDER — INSULIN ASPART 100 UNIT/ML ~~LOC~~ SOLN: 0.0000 [IU] | SUBCUTANEOUS | Status: DC

## 2011-06-30 MED ORDER — LACTATED RINGERS IV SOLN
INTRAVENOUS | Status: DC
  Administered 2011-06-30: 06:00:00 via INTRAVENOUS

## 2011-06-30 MED ORDER — DEXTROSE 50 % IV SOLN
INTRAVENOUS | Status: AC
  Administered 2011-06-30: 12.5 mL via INTRAVENOUS
  Filled 2011-06-30: qty 50

## 2011-06-30 MED ORDER — HEPARIN SODIUM (PORCINE) 5000 UNIT/ML IJ SOLN
5000.0000 [IU] | Freq: Three times a day (TID) | INTRAMUSCULAR | Status: DC
  Administered 2011-06-30 – 2011-07-03 (×8): 5000 [IU] via SUBCUTANEOUS
  Filled 2011-06-30 (×11): qty 1

## 2011-06-30 MED ORDER — ROCURONIUM BROMIDE 100 MG/10ML IV SOLN
INTRAVENOUS | Status: DC | PRN
  Administered 2011-06-30: 10 mg via INTRAVENOUS
  Administered 2011-06-30: 20 mg via INTRAVENOUS
  Administered 2011-06-30: 50 mg via INTRAVENOUS

## 2011-06-30 MED ORDER — BUPIVACAINE-EPINEPHRINE 0.25% -1:200000 IJ SOLN
INTRAMUSCULAR | Status: DC | PRN
  Administered 2011-06-30: 30 mL

## 2011-06-30 MED ORDER — SUCCINYLCHOLINE CHLORIDE 20 MG/ML IJ SOLN
INTRAMUSCULAR | Status: DC | PRN
  Administered 2011-06-30: 100 mg via INTRAVENOUS

## 2011-06-30 MED ORDER — UNJURY VANILLA POWDER: 2.0000 [oz_av] | Freq: Four times a day (QID) | ORAL | Status: DC

## 2011-06-30 MED ORDER — PROMETHAZINE HCL 25 MG/ML IJ SOLN: 6.2500 mg | INTRAMUSCULAR | Status: DC | PRN

## 2011-06-30 MED ORDER — POTASSIUM CHLORIDE IN NACL 20-0.9 MEQ/L-% IV SOLN
INTRAVENOUS | Status: AC
  Filled 2011-06-30: qty 1000

## 2011-06-30 MED ORDER — NEOSTIGMINE METHYLSULFATE 1 MG/ML IJ SOLN
INTRAMUSCULAR | Status: DC | PRN
  Administered 2011-06-30: 5 mg via INTRAVENOUS

## 2011-06-30 MED ORDER — ONDANSETRON HCL 4 MG/2ML IJ SOLN
INTRAMUSCULAR | Status: DC | PRN
  Administered 2011-06-30: 4 mg via INTRAVENOUS

## 2011-06-30 MED ORDER — MIDAZOLAM HCL 5 MG/5ML IJ SOLN
INTRAMUSCULAR | Status: DC | PRN
  Administered 2011-06-30: 2 mg via INTRAVENOUS

## 2011-06-30 MED ORDER — HYDROMORPHONE HCL PF 1 MG/ML IJ SOLN: 0.2500 mg | INTRAMUSCULAR | Status: DC | PRN

## 2011-06-30 MED ORDER — CEFOXITIN SODIUM-DEXTROSE 1-4 GM-% IV SOLR (PREMIX)
INTRAVENOUS | Status: AC
  Filled 2011-06-30: qty 100

## 2011-06-30 MED ORDER — OXYCODONE-ACETAMINOPHEN 5-325 MG/5ML PO SOLN
5.0000 mL | ORAL | Status: DC | PRN
  Administered 2011-07-01: 5 mL via ORAL
  Administered 2011-07-02 (×2): 10 mL via ORAL
  Administered 2011-07-02 (×2): 5 mL via ORAL
  Administered 2011-07-03 (×2): 10 mL via ORAL
  Filled 2011-06-30 (×3): qty 5
  Filled 2011-06-30 (×4): qty 10

## 2011-06-30 MED ORDER — POTASSIUM CHLORIDE IN NACL 20-0.9 MEQ/L-% IV SOLN
INTRAVENOUS | Status: DC
  Administered 2011-06-30: 11:00:00 via INTRAVENOUS
  Administered 2011-06-30: 1000 mL via INTRAVENOUS
  Administered 2011-07-01: 125 mL via INTRAVENOUS
  Administered 2011-07-01 – 2011-07-02 (×3): via INTRAVENOUS
  Filled 2011-06-30 (×10): qty 1000

## 2011-06-30 MED ORDER — LIDOCAINE HCL (CARDIAC) 20 MG/ML IV SOLN
INTRAVENOUS | Status: DC | PRN
  Administered 2011-06-30: 100 mg via INTRAVENOUS

## 2011-06-30 MED ORDER — UNJURY CHOCOLATE CLASSIC POWDER
2.0000 [oz_av] | Freq: Four times a day (QID) | ORAL | Status: DC
  Administered 2011-07-02 (×4): 2 [oz_av] via ORAL

## 2011-06-30 MED ORDER — BUPIVACAINE-EPINEPHRINE 0.25% -1:200000 IJ SOLN
INTRAMUSCULAR | Status: AC
  Filled 2011-06-30: qty 1

## 2011-06-30 MED ORDER — FENTANYL CITRATE 0.05 MG/ML IJ SOLN
INTRAMUSCULAR | Status: DC | PRN
  Administered 2011-06-30 (×7): 50 ug via INTRAVENOUS

## 2011-06-30 MED ORDER — ALBUTEROL SULFATE HFA 108 (90 BASE) MCG/ACT IN AERS
2.0000 | INHALATION_SPRAY | Freq: Four times a day (QID) | RESPIRATORY_TRACT | Status: DC | PRN
  Filled 2011-06-30: qty 6.7

## 2011-06-30 MED ORDER — DEXTROSE 50 % IV SOLN
12.5000 mL | Freq: Once | INTRAVENOUS | Status: AC
  Administered 2011-06-30: 12.5 mL via INTRAVENOUS

## 2011-06-30 MED ORDER — INSULIN GLARGINE 100 UNIT/ML ~~LOC~~ SOLN
20.0000 [IU] | Freq: Every day | SUBCUTANEOUS | Status: DC
  Administered 2011-06-30 – 2011-07-02 (×3): 20 [IU] via SUBCUTANEOUS
  Filled 2011-06-30: qty 3

## 2011-06-30 MED ORDER — GLYCOPYRROLATE 0.2 MG/ML IJ SOLN
INTRAMUSCULAR | Status: DC | PRN
  Administered 2011-06-30: .8 mg via INTRAVENOUS

## 2011-06-30 MED ORDER — INSULIN ASPART 100 UNIT/ML ~~LOC~~ SOLN
0.0000 [IU] | SUBCUTANEOUS | Status: DC
  Administered 2011-06-30: 4 [IU] via SUBCUTANEOUS
  Administered 2011-06-30: 7 [IU] via SUBCUTANEOUS
  Administered 2011-07-01 – 2011-07-02 (×6): 3 [IU] via SUBCUTANEOUS
  Filled 2011-06-30: qty 3

## 2011-06-30 MED ORDER — PROPOFOL 10 MG/ML IV EMUL
INTRAVENOUS | Status: DC | PRN
  Administered 2011-06-30: 200 mg via INTRAVENOUS

## 2011-06-30 MED ORDER — STERILE WATER FOR IRRIGATION IR SOLN
Status: DC | PRN
  Administered 2011-06-30: 1500 mL

## 2011-06-30 MED ORDER — LACTATED RINGERS IR SOLN
Status: DC | PRN
  Administered 2011-06-30: 3000 mL

## 2011-06-30 MED ORDER — UNJURY CHICKEN SOUP POWDER: 2.0000 [oz_av] | Freq: Four times a day (QID) | ORAL | Status: DC

## 2011-06-30 MED ORDER — MORPHINE SULFATE 2 MG/ML IJ SOLN
2.0000 mg | INTRAMUSCULAR | Status: DC | PRN
  Administered 2011-06-30: 4 mg via INTRAVENOUS
  Administered 2011-06-30: 2 mg via INTRAVENOUS
  Administered 2011-06-30 (×2): 4 mg via INTRAVENOUS
  Administered 2011-06-30: 2 mg via INTRAVENOUS
  Administered 2011-07-01 (×2): 4 mg via INTRAVENOUS
  Administered 2011-07-01: 6 mg via INTRAVENOUS
  Administered 2011-07-01: 4 mg via INTRAVENOUS
  Administered 2011-07-01 (×2): 6 mg via INTRAVENOUS
  Administered 2011-07-01: 4 mg via INTRAVENOUS
  Administered 2011-07-01: 6 mg via INTRAVENOUS
  Administered 2011-07-01: 4 mg via INTRAVENOUS
  Filled 2011-06-30: qty 1
  Filled 2011-06-30 (×2): qty 2
  Filled 2011-06-30: qty 1
  Filled 2011-06-30: qty 3
  Filled 2011-06-30 (×4): qty 2
  Filled 2011-06-30: qty 3
  Filled 2011-06-30: qty 2
  Filled 2011-06-30: qty 3
  Filled 2011-06-30: qty 2
  Filled 2011-06-30: qty 3

## 2011-06-30 SURGICAL SUPPLY — 76 items
APPLICATOR COTTON TIP 6IN STRL (MISCELLANEOUS) ×4 IMPLANT
APPLIER CLIP ROT 13.4 12 LRG (CLIP)
BENZOIN TINCTURE PRP APPL 2/3 (GAUZE/BANDAGES/DRESSINGS) IMPLANT
BLADE SURG 15 STRL LF DISP TIS (BLADE) IMPLANT
BLADE SURG 15 STRL SS (BLADE)
BLADE SURG SZ11 CARB STEEL (BLADE) ×2 IMPLANT
CABLE HIGH FREQUENCY MONO STRZ (ELECTRODE) ×2 IMPLANT
CANISTER SUCTION 2500CC (MISCELLANEOUS) ×2 IMPLANT
CATH FOLEY LATEX FREE 16FR (CATHETERS) ×2 IMPLANT
CHLORAPREP W/TINT 26ML (MISCELLANEOUS) ×2 IMPLANT
CLIP APPLIE ROT 13.4 12 LRG (CLIP) IMPLANT
CLIP SUT LAPRA TY ABSORB (SUTURE) ×6 IMPLANT
CLOTH BEACON ORANGE TIMEOUT ST (SAFETY) ×2 IMPLANT
COVER SURGICAL LIGHT HANDLE (MISCELLANEOUS) ×2 IMPLANT
CUTTER LINEAR ENDO ART 45 ETS (STAPLE) ×2 IMPLANT
DECANTER SPIKE VIAL GLASS SM (MISCELLANEOUS) ×2 IMPLANT
DERMABOND ADVANCED (GAUZE/BANDAGES/DRESSINGS) ×1
DERMABOND ADVANCED .7 DNX12 (GAUZE/BANDAGES/DRESSINGS) ×1 IMPLANT
DEVICE SUTURE ENDOST 10MM (ENDOMECHANICALS) ×2 IMPLANT
DISSECTOR BLUNT TIP ENDO 5MM (MISCELLANEOUS) IMPLANT
DRAIN PENROSE 18X1/4 LTX STRL (WOUND CARE) IMPLANT
DRAIN PENROSE LF 8X20.3CM SIL (WOUND CARE) ×2 IMPLANT
DRAPE CAMERA CLOSED 9X96 (DRAPES) ×2 IMPLANT
ELECT REM PT RETURN 9FT ADLT (ELECTROSURGICAL) ×2
ELECTRODE REM PT RTRN 9FT ADLT (ELECTROSURGICAL) ×1 IMPLANT
GAUZE SPONGE 4X4 16PLY XRAY LF (GAUZE/BANDAGES/DRESSINGS) ×2 IMPLANT
GLOVE BIOGEL PI IND STRL 7.0 (GLOVE) ×1 IMPLANT
GLOVE BIOGEL PI INDICATOR 7.0 (GLOVE) ×1
GOWN STRL NON-REIN LRG LVL3 (GOWN DISPOSABLE) ×2 IMPLANT
GOWN STRL REIN XL XLG (GOWN DISPOSABLE) ×4 IMPLANT
HEMOSTAT SURGICEL 4X8 (HEMOSTASIS) IMPLANT
HOVERMATT SINGLE USE (MISCELLANEOUS) ×2 IMPLANT
KIT BASIN OR (CUSTOM PROCEDURE TRAY) ×2 IMPLANT
KIT GASTRIC LAVAGE 34FR ADT (SET/KITS/TRAYS/PACK) ×2 IMPLANT
NEEDLE SPNL 22GX3.5 QUINCKE BK (NEEDLE) ×2 IMPLANT
NS IRRIG 1000ML POUR BTL (IV SOLUTION) ×2 IMPLANT
PACK CARDIOVASCULAR III (CUSTOM PROCEDURE TRAY) ×2 IMPLANT
PEN SKIN MARKING BROAD (MISCELLANEOUS) ×2 IMPLANT
POUCH SPECIMEN RETRIEVAL 10MM (ENDOMECHANICALS) IMPLANT
RELOAD 45 VASCULAR/THIN (ENDOMECHANICALS) ×2 IMPLANT
RELOAD BLUE (STAPLE) ×4 IMPLANT
RELOAD ENDO STITCH 2.0 (ENDOMECHANICALS) ×10
RELOAD GOLD (STAPLE) ×2 IMPLANT
RELOAD STAPLE TA45 3.5 REG BLU (ENDOMECHANICALS) ×4 IMPLANT
RELOAD WHITE ECR60W (STAPLE) IMPLANT
SCALPEL HARMONIC ACE (MISCELLANEOUS) ×2 IMPLANT
SCISSORS LAP 5X35 DISP (ENDOMECHANICALS) ×2 IMPLANT
SEALANT SURGICAL APPL DUAL CAN (MISCELLANEOUS) ×2 IMPLANT
SET IRRIG TUBING LAPAROSCOPIC (IRRIGATION / IRRIGATOR) ×2 IMPLANT
SLEEVE ADV FIXATION 12X100MM (TROCAR) ×4 IMPLANT
SLEEVE ADV FIXATION 5X100MM (TROCAR) ×2 IMPLANT
SOLUTION ANTI FOG 6CC (MISCELLANEOUS) ×2 IMPLANT
SPONGE GAUZE 4X4 12PLY (GAUZE/BANDAGES/DRESSINGS) ×2 IMPLANT
STAPLE ECHEON FLEX 60 POW ENDO (STAPLE) ×2 IMPLANT
STAPLER STANDARD HANDLE (STAPLE) IMPLANT
STAPLER VISISTAT 35W (STAPLE) ×2 IMPLANT
STRIP CLOSURE SKIN 1/2X4 (GAUZE/BANDAGES/DRESSINGS) IMPLANT
SUT ETHILON 3 0 PS 1 (SUTURE) IMPLANT
SUT MNCRL AB 4-0 PS2 18 (SUTURE) ×2 IMPLANT
SUT RELOAD ENDO STITCH 2 48X1 (ENDOMECHANICALS) ×5
SUT RELOAD ENDO STITCH 2.0 (ENDOMECHANICALS) ×5
SUT VIC AB 2-0 SH 27 (SUTURE) ×1
SUT VIC AB 2-0 SH 27X BRD (SUTURE) ×1 IMPLANT
SUTURE RELOAD END STTCH 2 48X1 (ENDOMECHANICALS) ×5 IMPLANT
SUTURE RELOAD ENDO STITCH 2.0 (ENDOMECHANICALS) ×5 IMPLANT
SYR 20CC LL (SYRINGE) ×2 IMPLANT
SYR 50ML LL SCALE MARK (SYRINGE) ×2 IMPLANT
SYR CONTROL 10ML LL (SYRINGE) ×2 IMPLANT
TOWEL OR 17X26 10 PK STRL BLUE (TOWEL DISPOSABLE) ×2 IMPLANT
TRAY FOLEY CATH 14FRSI W/METER (CATHETERS) IMPLANT
TROCAR ADV FIXATION 12X100MM (TROCAR) ×2 IMPLANT
TROCAR XCEL 12X100 BLDLESS (ENDOMECHANICALS) ×2 IMPLANT
TROCAR Z-THREAD FIOS 5X100MM (TROCAR) ×2 IMPLANT
TUBING ENDO SMARTCAP (MISCELLANEOUS) ×2 IMPLANT
TUBING FILTER THERMOFLATOR (ELECTROSURGICAL) ×2 IMPLANT
WATER STERILE IRR 1500ML POUR (IV SOLUTION) ×2 IMPLANT

## 2011-06-30 NOTE — Anesthesia Preprocedure Evaluation (Addendum)
Anesthesia Evaluation  Patient identified by MRN, date of birth, ID band Patient awake    Reviewed: Allergy & Precautions, H&P , NPO status , Patient's Chart, lab work & pertinent test results, reviewed documented beta blocker date and time   Airway  TM Distance: >3 FB Neck ROM: Full    Dental   Pulmonary neg pulmonary ROS,  clear to auscultation        Cardiovascular hypertension, Pt. on medications Regular Normal Denies cardiac symptoms   Neuro/Psych Negative Neurological ROS  Negative Psych ROS   GI/Hepatic negative GI ROS, Neg liver ROS,   Endo/Other  Diabetes mellitus-, Poorly Controlled, Type 1, Insulin DependentMorbid obesityNo AM insulin  Renal/GU negative Renal ROS  Genitourinary negative   Musculoskeletal negative musculoskeletal ROS (+)   Abdominal   Peds negative pediatric ROS (+)  Hematology Anemia, Hgb 11.4   Anesthesia Other Findings   Reproductive/Obstetrics negative OB ROS                           Anesthesia Physical Anesthesia Plan  ASA: III  Anesthesia Plan: General   Post-op Pain Management:    Induction: Intravenous  Airway Management Planned: Oral ETT  Additional Equipment:   Intra-op Plan:   Post-operative Plan: Extubation in OR  Informed Consent: I have reviewed the patients History and Physical, chart, labs and discussed the procedure including the risks, benefits and alternatives for the proposed anesthesia with the patient or authorized representative who has indicated his/her understanding and acceptance.   Dental advisory given  Plan Discussed with: CRNA and Surgeon  Anesthesia Plan Comments:         Anesthesia Quick Evaluation

## 2011-06-30 NOTE — Progress Notes (Signed)
Pt did bowel prep 06/29/11/ with good results

## 2011-06-30 NOTE — Anesthesia Postprocedure Evaluation (Signed)
  Anesthesia Post-op Note  Patient: [name]  Procedure(s) Performed:  LAPAROSCOPIC ROUX-EN-Y GASTRIC -    Patient Location: PACU  Anesthesia Type: General  Level of Consciousness: oriented and sedated  Airway and Oxygen Therapy: Patient Spontanous Breathing and Patient connected to nasal cannula oxygen  Post-op Pain: mild  Post-op Assessment: Post-op Vital signs reviewed, Patient's Cardiovascular Status Stable, Respiratory Function Stable and Patent Airway  Post-op Vital Signs: stable  Complications: No apparent anesthesia complications

## 2011-06-30 NOTE — Interval H&P Note (Signed)
History and Physical Interval Note:  06/30/2011 7:08 AM  [name]  has presented today for surgery, with the diagnosis of morbid obesity   The various methods of treatment have been discussed with the patient and family. After consideration of risks, benefits and other options for treatment, the patient has consented to  Procedure(s): LAPAROSCOPIC ROUX-EN-Y GASTRIC as a surgical intervention .  The patients' history has been reviewed, patient examined, no change in status, stable for surgery.  I have reviewed the patients' chart and labs.  Questions were answered to the patient's satisfaction.     Leory Allinson T

## 2011-06-30 NOTE — H&P (View-Only) (Signed)
Subjective:   Morbid obesity  Patient ID: [name], female   DOB: 12/02/1970, 41 y.o.   MRN: [id number]  HPI Patient returns to the office prior to planned laparoscopic Roux-en-Y gastric bypass. She has a history of obesity unresponsive to medical management and multiple comorbidities including severe insulin-dependent diabetes mellitus, asthma, degenerative disc disease, asthma and hyperlipidemia. She has successfully completed her reactive workup. There were no concerns on-site nutritional evaluation. We reviewed her chest x-ray, bone density and lab work which were all okay except for findings related to her diabetes  Past Medical History  Diagnosis Date  . Arthritis   . Asthma   . GERD (gastroesophageal reflux disease)   . Diabetes mellitus   . Hyperlipidemia   . Migraines   . Hypertension   . Back pain, chronic   . Depression   . Neuropathy    Past Surgical History  Procedure Date  . Back surgery November 1992  . Cesarean section December 1998  . Tubal ligation January 1999  . Cholecystectomy 1998   Current Outpatient Prescriptions  Medication Sig Dispense Refill  . albuterol (PROVENTIL HFA;VENTOLIN HFA) 108 (90 BASE) MCG/ACT inhaler Inhale 2 puffs into the lungs every 6 (six) hours as needed. WHEEZING       . Alcohol Swabs (B-D SINGLE USE SWABS REGULAR) PADS       . baclofen (LIORESAL) 10 MG tablet Take 10 mg by mouth 3 (three) times daily as needed. PAIN       . BD ULTRA-FINE PEN NEEDLES 29G X 12.7MM MISC       . CLEVER CHEK TEST test strip       . cyclobenzaprine (FLEXERIL) 10 MG tablet Take 10 mg by mouth 3 (three) times daily as needed. MUSCLE SPASM       . DULoxetine (CYMBALTA) 60 MG capsule Take 60 mg by mouth 2 (two) times daily.       . exenatide (BYETTA) 5 MCG/0.02ML SOLN Inject into the skin 2 (two) times daily with a meal.       . fish oil-omega-3 fatty acids 1000 MG capsule Take 1 g by mouth 2 (two) times daily.       . HYDROcodone-acetaminophen  (NORCO) 5-325 MG per tablet Take 1 tablet by mouth every 6 (six) hours as needed. PAIN       . insulin glargine (LANTUS) 100 UNIT/ML injection Inject 120 Units into the skin every morning.       . Lancets MISC       . lisinopril-hydrochlorothiazide (PRINZIDE,ZESTORETIC) 20-12.5 MG per tablet Take 1 tablet by mouth every morning.       . metroNIDAZOLE (FLAGYL) 500 MG tablet Take 500 mg by mouth 2 (two) times daily.       . montelukast (SINGULAIR) 10 MG tablet Take 10 mg by mouth daily before breakfast.       . omeprazole (PRILOSEC) 40 MG capsule Take 40 mg by mouth daily.       . potassium chloride (K-DUR,KLOR-CON) 10 MEQ tablet Take 10 mEq by mouth daily.        No Known Allergies History  Substance Use Topics  . Smoking status: Never Smoker   . Smokeless tobacco: Never Used  . Alcohol Use: No    Review of Systems  HENT: Negative.   Eyes: Positive for visual disturbance.  Respiratory: Negative.   Cardiovascular: Negative.   Gastrointestinal: Negative.   Musculoskeletal: Positive for back pain.       Objective:     Physical Exam BP 142/100  Pulse 72  Temp(Src) 97.6 F (36.4 C) (Temporal)  Resp 16  Ht 5' 5" (1.651 m)  Wt 221 lb 12.8 oz (100.608 kg)  BMI 36.91 kg/m2  LMP 05/26/2011 Body mass index is 36.91 kg/(m^2). General: Alert, obese African American female, in no distress Skin: Warm and dry without rash or infection. HEENT: No palpable masses or thyromegaly. Sclera nonicteric. Pupils equal round and reactive. Oropharynx clear. Lymph nodes: No cervical, supraclavicular, or inguinal nodes palpable. Lungs: Breath sounds clear and equal without increased work of breathing Cardiovascular: Regular rate and rhythm without murmur. No JVD or edema. Peripheral pulses intact. Abdomen: Nondistended. Soft and nontender. No masses palpable. No organomegaly. No palpable hernias. Extremities: No edema or joint swelling or deformity. No chronic venous stasis changes. Neurologic: Alert  and fully oriented. Gait normal.     Assessment:     Progressive morbid obesity unresponsive to medical management and multiple comorbidities including insulin-dependent diabetes mellitus, degenerative disc disease, GERD, hyperlipidemia    Plan:     For laparoscopic Roux-en-Y gastric bypass. We reviewed the consent form again today and all her questions were answered to her satisfaction.      

## 2011-06-30 NOTE — Transfer of Care (Signed)
Immediate Anesthesia Transfer of Care Note  Patient: [name]  Procedure(s) Performed:  LAPAROSCOPIC ROUX-EN-Y GASTRIC -    Patient Location: PACU  Anesthesia Type: General  Level of Consciousness: sedated, patient cooperative and responds to stimulation  Airway & Oxygen Therapy: Patient Spontanous Breathing and Patient connected to face mask oxygen  Post-op Assessment: Report given to PACU RN and Post -op Vital signs reviewed and stable  Post vital signs: Reviewed and stable Filed Vitals:   06/30/11 0532  BP: 155/96  Pulse:   Temp:   Resp:     Complications: No apparent anesthesia complications

## 2011-06-30 NOTE — Op Note (Signed)
Preop diagnosis: Morbid obesity  Postop diagnosis: Morbid obesity  Body mass index is 37.94 kg/(m^2).  Surgical procedure: Laparoscopic Roux-en-Y gastric bypass  Surgeon: Sharlet Salina T.Evann Erazo M.D.  Asst.: Luretha Murphy M.D.  Anesthesia: General  Complications:  None  EBL: Minimal  Drains: None  Disposition: PACU in good condition  Description of procedure: Patient is brought to the operating room and general anesthesia induced. She had received preoperative broad-spectrum IV antibiotics and subcutaneous heparin. The abdomen was widely sterilely prepped and draped. Patient timeout was performed and correct patient and procedure confirmed. Access was obtained with a 12 mm Optiview trocar in the left upper quadrant and pneumoperitoneum established without difficulty. Under direct vision 12 mm trocars were placed laterally in the right upper quadrant, right upper quadrant midclavicular line, and to the left and above the umbilicus for the camera port. A 5 mm trocar was placed laterally in the left upper quadrant. The omentum was brought into the upper abdomen and the transverse mesocolon elevated and the ligament of Treitz clearly identified. A 40 cm biliopancreatic limb was then carefully measured from the ligament of Treitz. The small intestine was divided at this point with a single firing of the white load linear stapler. A Penrose drain was sutured to the end of the Roux-en-Y limb for later identification. A 100 cm Roux-en-Y limb was then carefully measured. At this point a side-to-side anastomosis was created between the Roux limb and the end of the biliopancreatic limb. This was accomplished with a single firing of the 45 mm white load linear stapler. The common enterotomy was closed with a running 2-0 Vicryl begun at either end of the enterotomy and tied centrally. The mesenteric defect was then closed with running 2-0 silk. The omentum was then divided with the harmonic scalpel up towards  the transverse colon to allow mobility of the Roux limb toward the gastric pouch. The patient was then placed in steep reversed Trendelenburg. Through a 5 mm subxiphoid site the [facility] retractor was placed and the left lobe of the liver elevated with excellent exposure of the upper stomach and hiatus. The angle of Hiss was then mobilized with the harmonic scalpel. A 4 cm gastric pouch was then carefully measured along the lesser curve of the stomach. Dissection was carried along the lesser curve at this point with the Harmonic scalpel working carefully back toward the lesser sac at right angles to the lesser curve. The free lesser sac was then entered. After being sure all tubes were removed from the stomach an initial firing of the gold load 60 mm linear stapler was fired at right angles across the lesser curve for about 4 cm. The gastric pouch was further mobilized posteriorly and then the pouch was completed with 2 further firings of the 60 mm blue load linear stapler up through the previously dissected angle of His. It was ensured that the pouch was completely mobilized away from the gastric remnant. This created a nice tubular 4-5 cm gastric pouch. The staple line of the gastric remnant was then oversewn with 2-0 silk for hemostasis. The Roux limb was then brought up in an antecolic fashion with the candycane facing to the patient's left without undue tension. The gastrojejunostomy was created with an initial posterior row of 2-0 Vicryl between the Roux limb and the staple line of the gastric pouch. Enterotomies were then made in the gastric pouch and the Roux limb with the harmonic scalpel and at approximately 2-2-1/2 cm anastomosis was created with a single  firing of the blue load linear stapler. The staple line was inspected and was intact without bleeding. The common enterotomy was then closed with running 2-0 Vicryl begun at either end and tied centrally. The wall tube was then easily passed through the  anastomosis and an outer anterior layer of running 2-0 Vicryl was placed. The Ewald tube was removed. With the outlet of the gastrojejunostomy clamped and under saline irrigation the assistant performed upper endoscopy and with the gastric pouch tensely distended with air there was no evidence of leak. The pouch was desufflated. The Vonita Moss defect was closed with running 2-0 silk. The abdomen was inspected for any evidence of bleeding or bowel injury and everything looked fine. The Nathanson retractor was removed under direct vision after coating the anastomosis with Tisseel tissue sealant. All CO2 was evacuated and trochars removed. Skin incisions were closed with staples. Sponge needle and instrument counts were correct. The patient was taken to the PACU in good condition.     Mariella Saa MD, FACS  06/30/2011, 10:36 AM

## 2011-06-30 NOTE — Preoperative (Signed)
Beta Blockers   Reason not to administer Beta Blockers:Not Applicable 

## 2011-07-01 ENCOUNTER — Inpatient Hospital Stay (HOSPITAL_COMMUNITY): Payer: Medicare HMO

## 2011-07-01 DIAGNOSIS — Z9889 Other specified postprocedural states: Secondary | ICD-10-CM

## 2011-07-01 LAB — GLUCOSE, CAPILLARY
Glucose-Capillary: 121 mg/dL — ABNORMAL HIGH (ref 70–99)
Glucose-Capillary: 131 mg/dL — ABNORMAL HIGH (ref 70–99)

## 2011-07-01 LAB — DIFFERENTIAL
Eosinophils Absolute: 0 10*3/uL (ref 0.0–0.7)
Eosinophils Relative: 0 % (ref 0–5)
Lymphs Abs: 1.5 10*3/uL (ref 0.7–4.0)
Monocytes Relative: 8 % (ref 3–12)

## 2011-07-01 LAB — CBC
Hemoglobin: 10.7 g/dL — ABNORMAL LOW (ref 12.0–15.0)
MCH: 26.4 pg (ref 26.0–34.0)
MCHC: 32 g/dL (ref 30.0–36.0)
MCV: 82.5 fL (ref 78.0–100.0)
Platelets: 349 10*3/uL (ref 150–400)
RBC: 4.05 MIL/uL (ref 3.87–5.11)

## 2011-07-01 MED ORDER — HYDRALAZINE HCL 20 MG/ML IJ SOLN
10.0000 mg | INTRAMUSCULAR | Status: DC | PRN
  Administered 2011-07-01: 10 mg via INTRAVENOUS
  Filled 2011-07-01: qty 1

## 2011-07-01 MED ORDER — LISINOPRIL-HYDROCHLOROTHIAZIDE 20-12.5 MG PO TABS: 1.0000 | ORAL_TABLET | ORAL | Status: DC

## 2011-07-01 MED ORDER — HYDROCHLOROTHIAZIDE 12.5 MG PO CAPS
12.5000 mg | ORAL_CAPSULE | Freq: Every day | ORAL | Status: DC
  Administered 2011-07-02: 12.5 mg via ORAL
  Filled 2011-07-01 (×3): qty 1

## 2011-07-01 MED ORDER — LISINOPRIL 20 MG PO TABS
20.0000 mg | ORAL_TABLET | Freq: Every day | ORAL | Status: DC
  Administered 2011-07-01 – 2011-07-02 (×2): 20 mg via ORAL
  Filled 2011-07-01 (×3): qty 1

## 2011-07-01 MED ORDER — IOHEXOL 300 MG/ML  SOLN: 50.0000 mL | Freq: Once | INTRAMUSCULAR | Status: AC | PRN

## 2011-07-01 NOTE — Progress Notes (Signed)
Dr. Johna Sheriff notified of elevated blood pressures. Dr. Johna Sheriff stated he will take a look at patient's meds.

## 2011-07-01 NOTE — Progress Notes (Signed)
Dr. Johna Sheriff notified of UGI results; orders received to advance pt to POD #1 diet.  Trula Ore, RN notified. Talmadge Chad, RN Bariatric Nurse Coordinator

## 2011-07-01 NOTE — Progress Notes (Addendum)
Pt alert and oriented; BP increased due to pt returning from UGI, then bathing; encouraged pt to rest; c/o abdominal pain now but verbalizes relief with prn pain meds; doppler study-normal; awaiting results for UGI; denies nausea or vomiting; c/o gas pain; voiding without difficulty; ambulating well; will reassess after lunch; discharge instructions given for pt to review later today and will complete tomorrow. GASTRIC BYPASS/SLEEVE DISCHARGE INSTRUCTIONS  Drs. Fredrik Rigger, [place], [place], and [place] Call if you have any problems.   Call [phone] and ask for the surgeon on call.    If you need immediate assistance come to the ER at [facility]. Tell the ER personnel that you are a new post-op gastric bypass patient. Signs and symptoms to report:   Severe vomiting or nausea. If you cannot tolerate clear liquids for longer than 1 day, you need to call your surgeon.    Abdominal pain which does not get better after taking your pain medication   Fever greater than 101 F degree   Difficulty breathing   Chest pain    Redness, swelling, drainage, or foul odor at incision sites    If your incisions open or pull apart   Swelling or pain in calf (lower leg)   Diarrhea, frequent watery, uncontrolled bowel movements.   Constipation, (no bowel movements for 3 days) if this occurs, Take Milk of Magnesia, 2 tablespoons by mouth, 3 times a day for 2 days if needed.  Call your doctor if constipation continues. Stop taking Milk of Magnesia once you have had a bowel movement. You may also use Miralax according to the label instructions.   Anything you consider "abnormal for you".   Normal side effects after Surgery:   Unable to sleep at night or concentrate   Irritability   Being tearful (crying) or depressed   These are common complaints, possibly related to your anesthesia, stress of surgery and change in lifestyle, that usually go away a few weeks after surgery.  If these feelings continue, call  your medical doctor.  Wound Care You may have surgical glue, steri-strips, or staples over your incisions after surgery.  Surgical glue:  Looks like a clear film over your incisions and will wear off gradually. Steri-strips: Strips of tape over your incisions. You may notice a yellowish color on the skin underneath the steri-strips. This is a substance used to make the steri-strips stick better. Do not pull the steri-strips off - let them fall off.  Staples: Cherlynn Polo may be removed before you leave the hospital. If you go home with staples, call [facility] [phone]) for an appointment with your surgeon's nurse to have staples removed in 7 - 10 days. Showering: You may shower two days after your surgery unless otherwise instructed by your surgeon. Wash gently around wounds with warm soapy water, rinse well, and gently pat dry.  If you have a drain, you may need someone to hold this while you shower. Avoid tub baths until staples are removed and incisions are healed.    Medications   Medications should be liquid or crushed if larger than the size of a dime.  Extended release pills should not be crushed.   Depending on the size and number of medications you take, you may need to stagger/change the time you take your medications so that you do not over-fill your pouch.    Make sure you follow-up with your primary care physician to make medication adjustments needed during rapid weight loss and life-style adjustment.  If you are diabetic, follow up with the doctor that prescribes your diabetes medication(s) within one week after surgery and check your blood sugar regularly.   Do not drive while taking narcotics!   Do not take acetaminophen (Tylenol) and Roxicet or Lortab Elixir at the same time since these pain medications contain acetaminophen.  Diet at home: (First 2 Weeks) You will see the nutritionist two weeks after your surgery. She will advance your diet if you are tolerating  liquids well. Once at home, if you have severe vomiting or nausea and cannot tolerate clear liquids lasting longer than 1 day, call your surgeon.  Begin high protein shake 2 ounces every 3 hours, 5 - 6 times per day.  Gradually increase the amount you drink as tolerated.  You may find it easier to slowly sip shakes throughout the day.  It is important to get your proteins in first.   Protein Shake   Drink at least 2 ounces of shake 5-6 times per day   Each serving of protein shakes should have a minimum of 15 grams of protein and no more than 5 grams of carbohydrate    Increase the amount of protein shake you drink as tolerated   Protein powder may be added to fluids such as non-fat milk or Lactaid milk (limit to 20 grams added protein powder per serving   The initial goal is to drink at least 8 ounces of protein shake/drink per day (or as directed by the nutritionist). Some examples of protein shakes are [employer], [employer], EAS Edge HP, and Unjury. Hydration   Gradually increase the amount of water and other liquids as tolerated (See Acceptable Fluids)   Gradually increase the amount of protein shake as tolerated     Sip fluids slowly and throughout the day   May use Sugar substitutes, use sparingly (limit to 6 - 8 packets per day). Your fluid goal is 64 ounces of fluid daily. It may take a few weeks to build up to this.         32 oz (or more) should be clear liquids and 32 oz (or more) should be full liquids.         Liquids should not contain sugar, caffeine, or carbonation! Acceptable Fluids Clear Liquids:   Water or Sugar-free flavored water, Fruit H2O   Decaffeinated coffee or tea (sugar-free)   Crystal Lite, Wyler's Lite, Minute Maid Lite   Sugar-free Jell-O   Bouillon or broth   Sugar-free Popsicle:   *Less than 20 calories each; Limit 1 per day   Full Liquids:              Protein Shakes/Drinks + 2 choices per day of other full liquids shown below.    Other full  liquids must be: No more than 12 grams of Carbs per serving,  No more than 3 grams of Fat per serving   Strained low-fat cream soup   Non-Fat milk   Fat-free Lactaid Milk   Sugar-free yogurt (Dannon Lite & Fit) Vitamins and Minerals (Start 1 day after surgery unless otherwise directed)   2 Chewable Multivitamin / Multimineral Supplement (i.e. Centrum for Adults)   Chewable Calcium Citrate with Vitamin D-3. Take 1500 mg each day.           (Example: 3 Chewable Calcium Plus 600 with Vitamin D-3 can be found at [facility])         Vitamin B-12, 350 - 500 micrograms (oral tablet) each day  Do not mix multivitamins containing iron with calcium supplements; take 2 hours   apart   Do not substitute Tums (calcium carbonate) for your calcium   Menstruating women and those at risk for anemia may need extra iron. Talk with your doctor to see if you need additional iron.    If you need extra iron:  Total daily Iron recommendations (including Vitamins) = 50 - 100 mg Iron/day Do not stop taking or change any vitamins or minerals until you talk to your nutritionist or surgeon. Your nutritionist and / or physician must approve all vitamin and mineral supplements. Exercise For maximum success, begin exercising as soon as your doctor recommends. Make sure your physician approves any physical activity.   Depending on fitness level, begin with a simple walking program   Walk 5-15 minutes each day, 7 days per week.    Slowly increase until you are walking 30-45 minutes per day   Consider joining our BELT program. [phone] or email belt@[employer] [email or web address] Things to remember:    You may have sexual relations when you feel comfortable. It is VERY important for female patients to use a reliable birth control method. Fertility often increases after surgery. Do not get pregnant for at least 18 months.   It is very important to keep all follow up appointments with your surgeon, nutritionist, primary care physician, and  behavioral health practitioner. After the first year, please follow up with your bariatric surgeon at least once a year in order to maintain best weight loss results.  [facility]: [phone] [facility]: [phone]   Free counseling is available for you and your family through collaboration between [facility] and [place]. Please call [phone] and leave a message.    Consider purchasing a medical alert bracelet that says you had gastric bypass surgery.    [facility] has a free Bariatric Surgery Support Group that meets monthly, the 3rd Thursday, 6 pm, Classroom #1, [place]. You may register online at [email or web address], but registration is not necessary. Select Classes and Support Groups, Bariatric Surgery, or Call [phone]   Do not return to work or drive until cleared by your surgeon   Use your CPAP when sleeping if applicable   Do not lift anything greater than ten pounds for at least two weeks  Talmadge Chad, RN Bariatric Nurse Coordinator

## 2011-07-01 NOTE — Progress Notes (Signed)
VASCULAR LAB PRELIMINARY  PRELIMINARY  PRELIMINARY  PRELIMINARY  Bilateral lower extremity venous duplex  completed.    Preliminary report:  Bilateral:  No evidence of DVT, superficial thrombosis, or Baker's Cyst.    Terance Hart, RVT 07/01/2011, 8:43 AM

## 2011-07-01 NOTE — Progress Notes (Signed)
Patient ID: [name], female   DOB: [birth year], 41 y.o.   MRN: [id number] 1 Day Post-Op  Subjective: C/O low chest ans epigastric pressure, abd soreness No nausea.  Better with meds  Objective: Vital signs in last 24 hours: Temp:  [97.4 F (36.3 C)-98.7 F (37.1 C)] 97.9 F (36.6 C) (01/16 0513) Pulse Rate:  [89-107] 102  (01/16 0513) Resp:  [16-24] 16  (01/16 0513) BP: (149-164)/(90-106) 158/98 mmHg (01/16 0513) SpO2:  [92 %-100 %] 92 % (01/16 0513) Last BM Date: 06/30/11  Intake/Output from previous day: 01/15 0701 - 01/16 0700 In: 3575 [I.V.:3575] Out: 1710 [Urine:1695; Blood:15] Intake/Output this shift: Total I/O In: -  Out: 100 [Urine:100]  General appearance: alert and no distress GI: abnormal findings:  mild tenderness in the epigastrium Incision/Wound:Clean and dry  Lab Results:   Basename 07/01/11 0405 06/30/11 1856 06/30/11 1112  WBC 9.1 -- 10.5  HGB 10.7* 10.9* --  HCT 33.4* 33.3* --  PLT 349 -- 336   BMET  Basename 06/30/11 1112  NA --  K --  CL --  CO2 --  GLUCOSE --  BUN --  CREATININE 0.58  CALCIUM --   CBG (last 3)   Basename 07/01/11 0353 07/01/11 0020 06/30/11 2123  GLUCAP 115* 131* 180*    PT/INR No results found for this basename: LABPROT:2,INR:2 in the last 72 hours ABG No results found for this basename: PHART:2,PCO2:2,PO2:2,HCO3:2 in the last 72 hours  Studies/Results: No results found.  Anti-infectives: Anti-infectives     Start     Dose/Rate Route Frequency Ordered Stop   06/30/11 0600   cefOXitin (MEFOXIN) 2 g in dextrose 5 % 50 mL IVPB        2 g 100 mL/hr over 30 Minutes Intravenous 60 min pre-op 06/30/11 0503 06/30/11 0729          Assessment/Plan: s/p Procedure(s): LAPAROSCOPIC ROUX-EN-Y GASTRIC Stable post op.  For gastrograffin UGI this AM   LOS: 1 day    Elvena Oyer T 07/01/2011

## 2011-07-02 ENCOUNTER — Encounter (HOSPITAL_COMMUNITY): Payer: Self-pay | Admitting: General Surgery

## 2011-07-02 LAB — DIFFERENTIAL
Basophils Relative: 0 % (ref 0–1)
Eosinophils Absolute: 0.1 10*3/uL (ref 0.0–0.7)
Monocytes Relative: 9 % (ref 3–12)
Neutrophils Relative %: 70 % (ref 43–77)

## 2011-07-02 LAB — GLUCOSE, CAPILLARY
Glucose-Capillary: 124 mg/dL — ABNORMAL HIGH (ref 70–99)
Glucose-Capillary: 136 mg/dL — ABNORMAL HIGH (ref 70–99)
Glucose-Capillary: 108 mg/dL — ABNORMAL HIGH (ref 70–99)
Glucose-Capillary: 97 mg/dL (ref 70–99)
Glucose-Capillary: 103 mg/dL — ABNORMAL HIGH (ref 70–99)

## 2011-07-02 LAB — CBC
Hemoglobin: 10 g/dL — ABNORMAL LOW (ref 12.0–15.0)
MCH: 26.2 pg (ref 26.0–34.0)
MCHC: 31.6 g/dL (ref 30.0–36.0)

## 2011-07-02 MED ORDER — DIPHENHYDRAMINE HCL 25 MG PO CAPS
25.0000 mg | ORAL_CAPSULE | ORAL | Status: DC | PRN
  Administered 2011-07-02 – 2011-07-03 (×5): 25 mg via ORAL
  Filled 2011-07-02 (×5): qty 1

## 2011-07-02 NOTE — Plan of Care (Signed)
Problem: Phase II Progression Outcomes Goal: Tolerating diet advance to Outcome: Completed/Met Date Met:  07/02/11 POD #2

## 2011-07-02 NOTE — Progress Notes (Signed)
Nutrition Brief Note  - Received call from Talmadge Chad regarding pt interested in obtaining Unjury shake for use at home. [facility] outpatient pharmacy currently does not stock this product, however in speaking with Harvie Heck, PharmD, goal is for this product to be stocked in the future. Informed pt that she can buy Unjury directly from [employer] and from [email or web address] and informed her of the prices of these products. Pt appreciative of information. Will monitor for any further nutritional concerns.  Dietitian # [phone]

## 2011-07-02 NOTE — Progress Notes (Signed)
Patient ID: [name], female   DOB: 27-Jan-1971, 40 y.o.   MRN: [id number] 2 Days Post-Op  Subjective: Abdomen sore, denies pain. Tolerating water. No bowel movement or flatus yet. Have some headache and occasional itching without rash.  Objective: Vital signs in last 24 hours: Temp:  [98 F (36.7 C)-99.6 F (37.6 C)] 99.6 F (37.6 C) (01/17 0608) Pulse Rate:  [101-118] 112  (01/17 0608) Resp:  [16-18] 18  (01/17 0608) BP: (120-192)/(70-106) 151/90 mmHg (01/17 0608) SpO2:  [90 %-96 %] 93 % (01/17 0608) Last BM Date: 06/30/11  Intake/Output from previous day: 01/16 0701 - 01/17 0700 In: 796 [I.V.:796] Out: 2000 [Urine:2000] Intake/Output this shift:    General appearance: alert and no distress GI: abnormal findings:  mild tenderness in the epigastrium, pos BS Incision/Wound:Clean and dry  Lab Results:   [facility] 07/02/11 0334 07/01/11 1730 07/01/11 0405  WBC 8.6 -- 9.1  HGB 10.0* 11.2* --  HCT 31.6* 34.7* --  PLT 316 -- 349   BMET  Basename 06/30/11 1112  NA --  K --  CL --  CO2 --  GLUCOSE --  BUN --  CREATININE 0.58  CALCIUM --   CBG (last 3)   Basename 07/02/11 0348 07/01/11 2340 07/01/11 1944  GLUCAP 104* 124* 136*     PT/INR No results found for this basename: LABPROT:2,INR:2 in the last 72 hours ABG No results found for this basename: PHART:2,PCO2:2,PO2:2,HCO3:2 in the last 72 hours  Studies/Results: Dg Ugi W/water Sol Cm  07/01/2011  *RADIOLOGY REPORT*  Clinical Data: Post gastric bypass surgery on 06/30/2011  ESOPHOGRAM/BARIUM SWALLOW  Technique:  Single contrast examination was performed using water soluble.  Fluoroscopy time:  1.3 minutes.  Comparison:  None.  Findings:  Oral contrast flows freely from the esophagus into the gastric remnant.  Contrast flows freely through the gastrojejunostomy without evidence of leak or obstruction. Contrast flows distally within the small bowel without evidence of obstruction.  There is minimal residual  contrast within the esophagus at the end of exam.  IMPRESSION:   No evidence of leak or obstruction at the gastrojejunostomy.  Original Report Authenticated By: Genevive Bi, M.D.    Anti-infectives: Anti-infectives     Start     Dose/Rate Route Frequency Ordered Stop   06/30/11 0600   cefOXitin (MEFOXIN) 2 g in dextrose 5 % 50 mL IVPB        2 g 100 mL/hr over 30 Minutes Intravenous 60 min pre-op 06/30/11 0503 06/30/11 0729          Assessment/Plan: s/p Procedure(s): LAPAROSCOPIC ROUX-EN-Y GASTRIC Generally doing well. Start protein shakes. Has low-grade temp and mild tachycardia but looks well, abdomen benign, and white count are normal. We'll check CBC in a.m. Probable discharge tomorrow.   LOS: 2 days    Carlisle Enke T 07/02/2011

## 2011-07-02 NOTE — Progress Notes (Signed)
Pt alert and oriented; stating she feels much better today; pt states she is very happy about her CBG's trending down below 200 and decreased need for insulin; BP's increased with pain and activity and Dr. Johna Sheriff aware; will continue to monitor; other VSS; denies nausea or vomiting; tolerating water and protein shakes; verbalized abdominal soreness which is relieved by prn pain meds; + flatus; voiding without diffculty; ambulating well; c/o mild itching at incisional sites but no redness or drainage or rash noted; pt already has follow up appts with [facility] and [facility]. Talmadge Chad, RN Bariatric Nurse Coordinator

## 2011-07-03 LAB — CBC
Hemoglobin: 9.9 g/dL — ABNORMAL LOW (ref 12.0–15.0)
MCH: 26.3 pg (ref 26.0–34.0)
RBC: 3.77 MIL/uL — ABNORMAL LOW (ref 3.87–5.11)
WBC: 7.6 10*3/uL (ref 4.0–10.5)

## 2011-07-03 LAB — GLUCOSE, CAPILLARY
Glucose-Capillary: 101 mg/dL — ABNORMAL HIGH (ref 70–99)
Glucose-Capillary: 110 mg/dL — ABNORMAL HIGH (ref 70–99)
Glucose-Capillary: 110 mg/dL — ABNORMAL HIGH (ref 70–99)

## 2011-07-03 MED ORDER — OXYCODONE-ACETAMINOPHEN 5-325 MG/5ML PO SOLN: 5.0000 mL | ORAL | Status: AC | PRN

## 2011-07-03 NOTE — Progress Notes (Addendum)
General Surgery Note  LOS: 3 days  POD# 3  Assessment/Plan:   1.  LAPAROSCOPIC ROUX-EN-Y GASTRIC - has done well.   Ready for d/c.  Instructions reviewed.  2.  DM - to modify insulin dosing at home. She sees Dr. Judie Petit. Hamrick as her PCP.  She will make an appt in the next 7 to 14 days to have her review her meds.  She knows to be careful with her insulin dosing and to check her glucoses frequently.  3.  Chronic back issues - sees Dr. Jeral Fruit  4.  History of depression.  5.  Hypertension.  Subjective:  Feels good.  Ready to go home.  Objective:   Filed Vitals:   07/03/11 0640  BP: 139/80  Pulse: 94  Temp: 98.3 F (36.8 C)  Resp: 18     Intake/Output from previous day:  01/17 0701 - 01/18 0700 In: 2355 [P.O.:1400; I.V.:955] Out: 1600 [Urine:1600]  Intake/Output this shift:      Physical Exam:   General: WN AA F who is alert and oriented.    HEENT: Normal. Pupils equal. Good dentition. .   Lungs: clear   Abdomen: soft   Wound: look good   Neurologic:  Grossly intact to motor and sensory function.   Psychiatric: Has normal mood and affect. Behavior is normal   Lab Results:    Basename 07/03/11 0330 07/02/11 0334  WBC 7.6 8.6  HGB 9.9* 10.0*  HCT 31.1* 31.6*  PLT 304 316    BMET   Basename 06/30/11 1112  NA --  K --  CL --  CO2 --  GLUCOSE --  BUN --  CREATININE 0.58  CALCIUM --    PT/INR  No results found for this basename: LABPROT:2,INR:2 in the last 72 hours  ABG  No results found for this basename: PHART:2,PCO2:2,PO2:2,HCO3:2 in the last 72 hours   Studies/Results:  Dg Ugi W/water Sol Cm  07/01/2011  *RADIOLOGY REPORT*  Clinical Data: Post gastric bypass surgery on 06/30/2011  ESOPHOGRAM/BARIUM SWALLOW  Technique:  Single contrast examination was performed using water soluble.  Fluoroscopy time:  1.3 minutes.  Comparison:  None.  Findings:  Oral contrast flows freely from the esophagus into the gastric remnant.  Contrast flows freely through the  gastrojejunostomy without evidence of leak or obstruction. Contrast flows distally within the small bowel without evidence of obstruction.  There is minimal residual contrast within the esophagus at the end of exam.  IMPRESSION:   No evidence of leak or obstruction at the gastrojejunostomy.  Original Report Authenticated By: Genevive Bi, M.D.     Anti-infectives:   Anti-infectives     Start     Dose/Rate Route Frequency Ordered Stop   06/30/11 0600   cefOXitin (MEFOXIN) 2 g in dextrose 5 % 50 mL IVPB        2 g 100 mL/hr over 30 Minutes Intravenous 60 min pre-op 06/30/11 0503 06/30/11 0729           Ovidio Kin, MD, FACS Pager: [phone],   [facility] Office: [phone] 07/03/2011

## 2011-07-10 NOTE — Discharge Summary (Signed)
Patient ID: [name] [id number] 41 y.o. [birth year]  06/30/2011  Discharge date and time: 07/03/2011  Admitting Physician: Glenna Fellows T  Discharge Physician: Glenna Fellows T  Admission Diagnoses: morbid obesity   Discharge Diagnoses:  same  Operations: Procedure(s): LAPAROSCOPIC ROUX-EN-Y GASTRIC  Admission Condition: good  Discharged Condition: good  Indication for Admission: patient is a 41 year old female with morbid obesity unresponsive to medical management over a number of years and multiple comorbidities including severe insulin-dependent diabetes mellitus, asthma, degenerative disc disease, asthma and hyperlipidemia. Following an extensive workup and preparation detailed elsewhere she is admitted electively for laparoscopic Roux-en-Y gastric bypass  Hospital Course: the patient was admitted on the morning of her procedure and underwent an uneventful laparoscopic Roux-en-Y gastric bypass. Her postoperative course was uncomplicated. Gastrografin swallow on the first postoperative day showed no evidence of leak and she was begun on water and ice chips. She tolerated this well. She was started on protein shakes on the second postoperative day.white blood count was normal and hemoglobin was 10. She did have some low-grade fever of 99.6 and mild tachycardia of 112 and we elected to observe her for another day. On January 18, the third postoperative day, she was feeling well and afebrile. Abdomen was nontender. White count is 7.6 and hemoglobin stable at 9.9. She was felt ready for discharge at this time. Follow up in my office in one week.  Consults: None  Significant Diagnostic Studies: Gastrografin swallow negative on postop day #1   Disposition: Home  Patient Instructions:   Linda, [name]  Home Medication Instructions MVH:[id number]   Printed on:07/10/11 1008  Medication Information                    potassium chloride (K-DUR,KLOR-CON) 10 MEQ  tablet Take 10 mEq by mouth daily.            lisinopril-hydrochlorothiazide (PRINZIDE,ZESTORETIC) 20-12.5 MG per tablet Take 1 tablet by mouth every morning.            DULoxetine (CYMBALTA) 60 MG capsule Take 60 mg by mouth 2 (two) times daily.            fish oil-omega-3 fatty acids 1000 MG capsule Take 1 g by mouth 2 (two) times daily.            montelukast (SINGULAIR) 10 MG tablet Take 10 mg by mouth daily before breakfast.            cyclobenzaprine (FLEXERIL) 10 MG tablet Take 10 mg by mouth 3 (three) times daily as needed. MUSCLE SPASM            insulin glargine (LANTUS) 100 UNIT/ML injection Inject 120 Units into the skin every morning.            exenatide (BYETTA) 5 MCG/0.02ML SOLN Inject into the skin 2 (two) times daily with a meal.            Lancets MISC            BD ULTRA-FINE PEN NEEDLES 29G X 12.7MM MISC            CLEVER CHEK TEST test strip            Alcohol Swabs (B-D SINGLE USE SWABS REGULAR) PADS            baclofen (LIORESAL) 10 MG tablet Take 10 mg by mouth 3 (three) times daily as needed. PAIN            omeprazole (PRILOSEC) 40  MG capsule Take 40 mg by mouth daily.            albuterol (PROVENTIL HFA;VENTOLIN HFA) 108 (90 BASE) MCG/ACT inhaler Inhale 2 puffs into the lungs every 6 (six) hours as needed. WHEEZING            HYDROcodone-acetaminophen (NORCO) 5-325 MG per tablet Take 1 tablet by mouth every 6 (six) hours as needed. PAIN            oxyCODONE-acetaminophen (ROXICET) 5-325 MG/5ML solution Take 5-10 mLs by mouth every 4 (four) hours as needed.             Activity: activity as tolerated Diet: protein shakes  Wound Care: none needed  Follow-up:  With Dr. Johna Sheriff in 1 week.  Signed: Mariella Saa MD, FACS  07/10/2011, 10:08 AM

## 2011-07-14 ENCOUNTER — Encounter: Payer: Medicare HMO | Attending: Family Medicine | Admitting: *Deleted

## 2011-07-14 DIAGNOSIS — Z713 Dietary counseling and surveillance: Secondary | ICD-10-CM | POA: Insufficient documentation

## 2011-07-14 DIAGNOSIS — E119 Type 2 diabetes mellitus without complications: Secondary | ICD-10-CM | POA: Insufficient documentation

## 2011-07-14 NOTE — Progress Notes (Signed)
  Bariatric Class:  Appt start time: 1600 end time:  1700.  2 Week Post-Operative Nutrition Class  Patient was seen on 07/14/2011 for Post-Operative Nutrition education at [facility].   Surgery date: 06/30/11 Surgery type: Gastric Bypass  Weight today: 216 lbs Weight change: 7.8 lbs BMI: 35.5  The following the learning objective met the patient during this course:   Identifies Phase 3A (Soft, High Proteins) Dietary Goals and will begin from 2 weeks post-operatively to 2 months post-operatively   Identifies appropriate sources of fluids and proteins   States protein recommendations and appropriate sources post-operatively  Identifies the need for appropriate texture modifications, mastication, and bite sizes when consuming solids  Identifies appropriate multivitamin and calcium sources post-operatively  Describes the need for physical activity post-operatively and will follow MD recommendations  States when to call healthcare provider regarding medication questions or post-operative complications  Handouts given during class include:  Phase 3A: Soft, High Protein Diet Handout  Band Fill Guidelines Handout  Follow-Up Plan: Patient will follow-up at [facility] in 6 weeks for 2 months post-op nutrition visit for diet advancement per MD.

## 2011-07-14 NOTE — Patient Instructions (Signed)
Patient to follow Phase 3A-Soft, High Protein Diet and follow-up at [facility] in 6 weeks for 2 months post-op nutrition visit for diet advancement. 

## 2011-07-15 ENCOUNTER — Telehealth (INDEPENDENT_AMBULATORY_CARE_PROVIDER_SITE_OTHER): Payer: Self-pay

## 2011-07-15 NOTE — Telephone Encounter (Signed)
Left message for patient to call our office regarding her appointment for 07/16/11, need's to be rescheduled to 2/7 or 2/8 with Dr. Johna Sheriff

## 2011-07-16 ENCOUNTER — Ambulatory Visit (INDEPENDENT_AMBULATORY_CARE_PROVIDER_SITE_OTHER): Payer: Medicare HMO | Admitting: General Surgery

## 2011-07-21 ENCOUNTER — Inpatient Hospital Stay (HOSPITAL_COMMUNITY)
Admission: EM | Admit: 2011-07-21 | Discharge: 2011-07-28 | DRG: 392 | Disposition: A | Discharge status: Home / Self Care | Payer: Medicare HMO | Attending: General Surgery | Admitting: General Surgery

## 2011-07-21 ENCOUNTER — Other Ambulatory Visit: Payer: Self-pay

## 2011-07-21 ENCOUNTER — Emergency Department (HOSPITAL_COMMUNITY): Payer: Medicare HMO

## 2011-07-21 ENCOUNTER — Inpatient Hospital Stay (HOSPITAL_COMMUNITY): Payer: Medicare HMO

## 2011-07-21 ENCOUNTER — Encounter (HOSPITAL_COMMUNITY): Payer: Self-pay | Admitting: Family Medicine

## 2011-07-21 DIAGNOSIS — M129 Arthropathy, unspecified: Secondary | ICD-10-CM | POA: Diagnosis present

## 2011-07-21 DIAGNOSIS — G43909 Migraine, unspecified, not intractable, without status migrainosus: Secondary | ICD-10-CM | POA: Diagnosis present

## 2011-07-21 DIAGNOSIS — R112 Nausea with vomiting, unspecified: Secondary | ICD-10-CM | POA: Diagnosis present

## 2011-07-21 DIAGNOSIS — I10 Essential (primary) hypertension: Secondary | ICD-10-CM | POA: Diagnosis present

## 2011-07-21 DIAGNOSIS — K219 Gastro-esophageal reflux disease without esophagitis: Secondary | ICD-10-CM | POA: Diagnosis present

## 2011-07-21 DIAGNOSIS — R109 Unspecified abdominal pain: Secondary | ICD-10-CM | POA: Diagnosis present

## 2011-07-21 DIAGNOSIS — F3289 Other specified depressive episodes: Secondary | ICD-10-CM | POA: Diagnosis present

## 2011-07-21 DIAGNOSIS — Z9884 Bariatric surgery status: Secondary | ICD-10-CM

## 2011-07-21 DIAGNOSIS — R1115 Cyclical vomiting syndrome unrelated to migraine: Principal | ICD-10-CM | POA: Diagnosis present

## 2011-07-21 DIAGNOSIS — J449 Chronic obstructive pulmonary disease, unspecified: Secondary | ICD-10-CM | POA: Diagnosis present

## 2011-07-21 DIAGNOSIS — F329 Major depressive disorder, single episode, unspecified: Secondary | ICD-10-CM | POA: Diagnosis present

## 2011-07-21 DIAGNOSIS — E785 Hyperlipidemia, unspecified: Secondary | ICD-10-CM | POA: Diagnosis present

## 2011-07-21 DIAGNOSIS — Z79899 Other long term (current) drug therapy: Secondary | ICD-10-CM

## 2011-07-21 DIAGNOSIS — E1149 Type 2 diabetes mellitus with other diabetic neurological complication: Secondary | ICD-10-CM | POA: Diagnosis present

## 2011-07-21 DIAGNOSIS — K7689 Other specified diseases of liver: Secondary | ICD-10-CM | POA: Diagnosis present

## 2011-07-21 DIAGNOSIS — K5900 Constipation, unspecified: Secondary | ICD-10-CM | POA: Diagnosis not present

## 2011-07-21 DIAGNOSIS — G8929 Other chronic pain: Secondary | ICD-10-CM | POA: Diagnosis present

## 2011-07-21 DIAGNOSIS — Z794 Long term (current) use of insulin: Secondary | ICD-10-CM

## 2011-07-21 DIAGNOSIS — E1142 Type 2 diabetes mellitus with diabetic polyneuropathy: Secondary | ICD-10-CM | POA: Diagnosis present

## 2011-07-21 DIAGNOSIS — R1110 Vomiting, unspecified: Secondary | ICD-10-CM

## 2011-07-21 DIAGNOSIS — J4489 Other specified chronic obstructive pulmonary disease: Secondary | ICD-10-CM | POA: Diagnosis present

## 2011-07-21 DIAGNOSIS — M549 Dorsalgia, unspecified: Secondary | ICD-10-CM | POA: Diagnosis present

## 2011-07-21 LAB — CBC
MCHC: 32.4 g/dL (ref 30.0–36.0)
RDW: 13.3 % (ref 11.5–15.5)

## 2011-07-21 LAB — DIFFERENTIAL
Basophils Absolute: 0 10*3/uL (ref 0.0–0.1)
Basophils Relative: 1 % (ref 0–1)
Eosinophils Absolute: 0.3 10*3/uL (ref 0.0–0.7)
Neutro Abs: 3.3 10*3/uL (ref 1.7–7.7)
Neutrophils Relative %: 55 % (ref 43–77)

## 2011-07-21 LAB — COMPREHENSIVE METABOLIC PANEL
AST: 87 U/L — ABNORMAL HIGH (ref 0–37)
Albumin: 3.4 g/dL — ABNORMAL LOW (ref 3.5–5.2)
Alkaline Phosphatase: 177 U/L — ABNORMAL HIGH (ref 39–117)
Chloride: 107 mEq/L (ref 96–112)
Creatinine, Ser: 0.59 mg/dL (ref 0.50–1.10)
Potassium: 3.8 mEq/L (ref 3.5–5.1)
Total Bilirubin: 0.3 mg/dL (ref 0.3–1.2)
Total Protein: 7.4 g/dL (ref 6.0–8.3)

## 2011-07-21 LAB — GLUCOSE, CAPILLARY
Glucose-Capillary: 147 mg/dL — ABNORMAL HIGH (ref 70–99)
Glucose-Capillary: 165 mg/dL — ABNORMAL HIGH (ref 70–99)

## 2011-07-21 MED ORDER — LISINOPRIL-HYDROCHLOROTHIAZIDE 20-12.5 MG PO TABS: 1.0000 | ORAL_TABLET | ORAL | Status: DC

## 2011-07-21 MED ORDER — POTASSIUM CHLORIDE IN NACL 20-0.9 MEQ/L-% IV SOLN
INTRAVENOUS | Status: DC
  Administered 2011-07-21 (×2): via INTRAVENOUS
  Administered 2011-07-22: 1000 mL via INTRAVENOUS
  Administered 2011-07-23: 07:00:00 via INTRAVENOUS
  Administered 2011-07-24: 100 mL via INTRAVENOUS
  Administered 2011-07-24 – 2011-07-25 (×2): via INTRAVENOUS
  Administered 2011-07-25: 100 mL via INTRAVENOUS
  Administered 2011-07-26 – 2011-07-27 (×3): via INTRAVENOUS
  Filled 2011-07-21 (×18): qty 1000

## 2011-07-21 MED ORDER — SODIUM CHLORIDE 0.9 % IV SOLN
Freq: Once | INTRAVENOUS | Status: AC
  Administered 2011-07-21: 09:00:00 via INTRAVENOUS

## 2011-07-21 MED ORDER — ALBUTEROL SULFATE HFA 108 (90 BASE) MCG/ACT IN AERS: 2.0000 | INHALATION_SPRAY | Freq: Four times a day (QID) | RESPIRATORY_TRACT | Status: DC | PRN

## 2011-07-21 MED ORDER — MORPHINE SULFATE 4 MG/ML IJ SOLN
4.0000 mg | Freq: Once | INTRAMUSCULAR | Status: AC
  Administered 2011-07-21: 4 mg via INTRAVENOUS
  Filled 2011-07-21: qty 1

## 2011-07-21 MED ORDER — ONDANSETRON HCL 4 MG/2ML IJ SOLN
4.0000 mg | Freq: Four times a day (QID) | INTRAMUSCULAR | Status: DC | PRN
  Administered 2011-07-21 – 2011-07-26 (×4): 4 mg via INTRAVENOUS
  Filled 2011-07-21 (×4): qty 2

## 2011-07-21 MED ORDER — ONDANSETRON HCL 4 MG/2ML IJ SOLN
4.0000 mg | Freq: Once | INTRAMUSCULAR | Status: AC
Start: 2011-07-21 — End: 2011-07-21
  Administered 2011-07-21: 4 mg via INTRAVENOUS
  Filled 2011-07-21: qty 2

## 2011-07-21 MED ORDER — IOHEXOL 300 MG/ML  SOLN
100.0000 mL | Freq: Once | INTRAMUSCULAR | Status: AC | PRN
  Administered 2011-07-21: 100 mL via INTRAVENOUS

## 2011-07-21 MED ORDER — INSULIN GLARGINE 100 UNIT/ML ~~LOC~~ SOLN
10.0000 [IU] | Freq: Every day | SUBCUTANEOUS | Status: DC
  Administered 2011-07-23 – 2011-07-27 (×6): 10 [IU] via SUBCUTANEOUS
  Filled 2011-07-21: qty 3

## 2011-07-21 MED ORDER — HEPARIN SODIUM (PORCINE) 5000 UNIT/ML IJ SOLN
5000.0000 [IU] | Freq: Three times a day (TID) | INTRAMUSCULAR | Status: DC
  Administered 2011-07-21 – 2011-07-28 (×21): 5000 [IU] via SUBCUTANEOUS
  Filled 2011-07-21 (×24): qty 1

## 2011-07-21 MED ORDER — HYDROCHLOROTHIAZIDE 12.5 MG PO CAPS
12.5000 mg | ORAL_CAPSULE | Freq: Every day | ORAL | Status: DC
  Administered 2011-07-22 – 2011-07-28 (×7): 12.5 mg via ORAL
  Filled 2011-07-21 (×8): qty 1

## 2011-07-21 MED ORDER — MORPHINE SULFATE 2 MG/ML IJ SOLN
2.0000 mg | INTRAMUSCULAR | Status: DC | PRN
  Administered 2011-07-21: 2 mg via INTRAVENOUS
  Administered 2011-07-21: 4 mg via INTRAVENOUS
  Administered 2011-07-21: 2 mg via INTRAVENOUS
  Administered 2011-07-22: 4 mg via INTRAVENOUS
  Administered 2011-07-22 (×3): 2 mg via INTRAVENOUS
  Administered 2011-07-22: 4 mg via INTRAVENOUS
  Administered 2011-07-22 – 2011-07-27 (×22): 2 mg via INTRAVENOUS
  Administered 2011-07-27: 4 mg via INTRAVENOUS
  Administered 2011-07-28: 2 mg via INTRAVENOUS
  Administered 2011-07-28: 4 mg via INTRAVENOUS
  Administered 2011-07-28: 2 mg via INTRAVENOUS
  Filled 2011-07-21: qty 1
  Filled 2011-07-21: qty 2
  Filled 2011-07-21 (×2): qty 1
  Filled 2011-07-21: qty 2
  Filled 2011-07-21 (×2): qty 1
  Filled 2011-07-21: qty 2
  Filled 2011-07-21 (×9): qty 1
  Filled 2011-07-21: qty 2
  Filled 2011-07-21 (×9): qty 1
  Filled 2011-07-21: qty 2
  Filled 2011-07-21 (×6): qty 1

## 2011-07-21 MED ORDER — PANTOPRAZOLE SODIUM 40 MG IV SOLR
40.0000 mg | Freq: Two times a day (BID) | INTRAVENOUS | Status: DC
  Administered 2011-07-21 – 2011-07-28 (×15): 40 mg via INTRAVENOUS
  Filled 2011-07-21 (×16): qty 40

## 2011-07-21 MED ORDER — DULOXETINE HCL 60 MG PO CPEP
60.0000 mg | ORAL_CAPSULE | Freq: Two times a day (BID) | ORAL | Status: DC
  Administered 2011-07-21 – 2011-07-28 (×14): 60 mg via ORAL
  Filled 2011-07-21 (×16): qty 1

## 2011-07-21 MED ORDER — MONTELUKAST SODIUM 10 MG PO TABS
10.0000 mg | ORAL_TABLET | Freq: Every day | ORAL | Status: DC
  Administered 2011-07-22 – 2011-07-28 (×6): 10 mg via ORAL
  Filled 2011-07-21 (×7): qty 1

## 2011-07-21 MED ORDER — LISINOPRIL 20 MG PO TABS
20.0000 mg | ORAL_TABLET | Freq: Every day | ORAL | Status: DC
Start: 2011-07-21 — End: 2011-07-28
  Administered 2011-07-22 – 2011-07-28 (×7): 20 mg via ORAL
  Filled 2011-07-21 (×8): qty 1

## 2011-07-21 MED ORDER — DIPHENHYDRAMINE HCL 50 MG/ML IJ SOLN
25.0000 mg | INTRAMUSCULAR | Status: DC | PRN
  Administered 2011-07-21 – 2011-07-22 (×7): 25 mg via INTRAVENOUS
  Filled 2011-07-21 (×6): qty 1

## 2011-07-21 MED ORDER — INSULIN ASPART 100 UNIT/ML ~~LOC~~ SOLN
0.0000 [IU] | SUBCUTANEOUS | Status: DC
Start: 2011-07-21 — End: 2011-07-25
  Administered 2011-07-21: 2 [IU] via SUBCUTANEOUS
  Administered 2011-07-21 – 2011-07-22 (×2): 3 [IU] via SUBCUTANEOUS
  Administered 2011-07-22: 2 [IU] via SUBCUTANEOUS
  Administered 2011-07-22 – 2011-07-23 (×5): 3 [IU] via SUBCUTANEOUS
  Administered 2011-07-23 (×2): 2 [IU] via SUBCUTANEOUS
  Administered 2011-07-23: 3 [IU] via SUBCUTANEOUS
  Administered 2011-07-23 – 2011-07-24 (×4): 2 [IU] via SUBCUTANEOUS
  Administered 2011-07-24: 3 [IU] via SUBCUTANEOUS
  Administered 2011-07-25 (×2): 2 [IU] via SUBCUTANEOUS
  Filled 2011-07-21: qty 3

## 2011-07-21 NOTE — ED Notes (Signed)
Pt reports having gastric bypass surgery on 1/15 and since then has not been feeling well and has been having N/V.

## 2011-07-21 NOTE — ED Notes (Signed)
Surgeon at bedside.  

## 2011-07-21 NOTE — ED Notes (Signed)
First attempt to call report. Floor rn reported that the staff are deciding if they will take this pt or a pt in surgery. Floor rn reported she will call back in a little bit.

## 2011-07-21 NOTE — ED Notes (Signed)
Pt reported that she was having chest pain. rn did an ekg and gave to md. Normal sinus rhythm. Pt reports hx of acid reflux. md went into room and reassessed pt. Pt in no acute distress.

## 2011-07-21 NOTE — H&P (Signed)
[name] is an 41 y.o. female.   Chief Complaint: Nausea  Vomiting post gastric bypass HPI: patient is a 41 year old female 3 weeks following laparoscopic Roux-en-Y gastric bypass for morbid obesity with multiple comorbidities as listed below. Her initial postoperative course was apparently uncomplicated and she was discharged home on the third postoperative day. She states that she has had continued difficulties with oral intake over the last couple of weeks. She initially was doing well with her protein shakes. However she states that clear liquids and water tended to cause nausea and discomfort. She was advised to start some solid food about one week ago. At that time she began to have increasing problems with nausea and occasional vomiting. She's been having increasing difficulty with oral intake in the last several days and now feels she's having difficulty taking any fluids with nausea and vomiting with any attempts. She is feeling weak. She has some epigastric and low chest pain that she states is similar to her previous reflux pain but really not having any severe abdominal pain. She is having intermittent diarrhea and occasional more normal bowel movements. Her last diarrhea was about 2 days ago when she had a normal bowel movement yesterday. No fever or chills. No melena, hematochezia or hematemesis.  Past Medical History  Diagnosis Date  . Arthritis   . Asthma   . GERD (gastroesophageal reflux disease)   . Diabetes mellitus   . Hyperlipidemia   . Migraines   . Hypertension   . Back pain, chronic   . Depression   . Neuropathy     Past Surgical History  Procedure Date  . Back surgery November 1992  . Cesarean section December 1998  . Tubal ligation January 1999  . Cholecystectomy 1998  . Gastric roux-en-y 06/30/2011    Procedure: LAPAROSCOPIC ROUX-EN-Y GASTRIC;  Surgeon: Mariella Saa, MD;  Location: [facility];  Service: General;  Laterality: N/A;       Family History    Problem Relation Age of Onset  . Diabetes Maternal Aunt   . Diabetes Paternal Aunt   . Diabetes Paternal Grandfather    Social History:  reports that she has never smoked. She has never used smokeless tobacco. She reports that she does not drink alcohol or use illicit drugs.  Allergies: No Known Allergies  Medications Prior to Admission  Medication Dose Route Frequency Provider Last Rate Last Dose  . 0.9 %  sodium chloride infusion   Intravenous Once Geoffery Lyons, MD 1,000 mL/hr at 07/21/11 0856    . morphine 4 MG/ML injection 4 mg  4 mg Intravenous Once Geoffery Lyons, MD   4 mg at 07/21/11 0856  . morphine 4 MG/ML injection 4 mg  4 mg Intravenous Once Geoffery Lyons, MD   4 mg at 07/21/11 0953  . ondansetron (ZOFRAN) injection 4 mg  4 mg Intravenous Once Geoffery Lyons, MD   4 mg at 07/21/11 1610   Medications Prior to Admission  Medication Sig Dispense Refill  . albuterol (PROVENTIL HFA;VENTOLIN HFA) 108 (90 BASE) MCG/ACT inhaler Inhale 2 puffs into the lungs every 6 (six) hours as needed. WHEEZING       . baclofen (LIORESAL) 10 MG tablet Take 10 mg by mouth 3 (three) times daily as needed. PAIN       . BD ULTRA-FINE PEN NEEDLES 29G X 12.7MM MISC       . CLEVER CHEK TEST test strip       . cyclobenzaprine (FLEXERIL) 10 MG  tablet Take 10 mg by mouth 3 (three) times daily as needed. MUSCLE SPASM       . DULoxetine (CYMBALTA) 60 MG capsule Take 60 mg by mouth 2 (two) times daily.       Marland Kitchen HYDROcodone-acetaminophen (NORCO) 5-325 MG per tablet Take 1 tablet by mouth every 6 (six) hours as needed. PAIN       . insulin glargine (LANTUS) 100 UNIT/ML injection Inject 20 Units into the skin at bedtime.       . Lancets MISC       . lisinopril-hydrochlorothiazide (PRINZIDE,ZESTORETIC) 20-12.5 MG per tablet Take 1 tablet by mouth every morning.       . montelukast (SINGULAIR) 10 MG tablet Take 10 mg by mouth daily before breakfast.         Results for orders placed during the hospital encounter  of 07/21/11 (from the past 48 hour(s))  CBC     Status: Abnormal   Collection Time   07/21/11  9:15 AM      Component Value Range Comment   WBC 6.0  4.0 - 10.5 (K/uL)    RBC 4.35  3.87 - 5.11 (MIL/uL)    Hemoglobin 11.4 (*) 12.0 - 15.0 (g/dL)    HCT 65.7 (*) 84.6 - 46.0 (%)    MCV 80.9  78.0 - 100.0 (fL)    MCH 26.2  26.0 - 34.0 (pg)    MCHC 32.4  30.0 - 36.0 (g/dL)    RDW 96.2  95.2 - 84.1 (%)    Platelets 385  150 - 400 (K/uL)   DIFFERENTIAL     Status: Normal   Collection Time   07/21/11  9:15 AM      Component Value Range Comment   Neutrophils Relative 55  43 - 77 (%)    Neutro Abs 3.3  1.7 - 7.7 (K/uL)    Lymphocytes Relative 32  12 - 46 (%)    Lymphs Abs 1.9  0.7 - 4.0 (K/uL)    Monocytes Relative 7  3 - 12 (%)    Monocytes Absolute 0.4  0.1 - 1.0 (K/uL)    Eosinophils Relative 5  0 - 5 (%)    Eosinophils Absolute 0.3  0.0 - 0.7 (K/uL)    Basophils Relative 1  0 - 1 (%)    Basophils Absolute 0.0  0.0 - 0.1 (K/uL)   COMPREHENSIVE METABOLIC PANEL     Status: Abnormal   Collection Time   07/21/11  9:15 AM      Component Value Range Comment   Sodium 141  135 - 145 (mEq/L)    Potassium 3.8  3.5 - 5.1 (mEq/L)    Chloride 107  96 - 112 (mEq/L)    CO2 22  19 - 32 (mEq/L)    Glucose, Bld 189 (*) 70 - 99 (mg/dL)    BUN 14  6 - 23 (mg/dL)    Creatinine, Ser 3.24  0.50 - 1.10 (mg/dL)    Calcium 9.4  8.4 - 10.5 (mg/dL)    Total Protein 7.4  6.0 - 8.3 (g/dL)    Albumin 3.4 (*) 3.5 - 5.2 (g/dL)    AST 87 (*) 0 - 37 (U/L)    ALT 71 (*) 0 - 35 (U/L)    Alkaline Phosphatase 177 (*) 39 - 117 (U/L)    Total Bilirubin 0.3  0.3 - 1.2 (mg/dL)    GFR calc non Af Amer >90  >90 (mL/min)    GFR calc Af Amer >  90  >90 (mL/min)   LIPASE, BLOOD     Status: Abnormal   Collection Time   07/21/11  9:15 AM      Component Value Range Comment   Lipase 61 (*) 11 - 59 (U/L)    No results found.  Review of Systems  Constitutional: Positive for weight loss and malaise/fatigue. Negative for fever and  chills.  Respiratory: Negative for cough, sputum production and shortness of breath.   Cardiovascular: Positive for chest pain. Negative for palpitations, leg swelling and PND.  Gastrointestinal: Positive for heartburn, nausea, vomiting, abdominal pain and diarrhea. Negative for constipation, blood in stool and melena.  Genitourinary: Negative for dysuria, urgency and frequency.  Musculoskeletal: Positive for back pain and joint pain.  Neurological: Positive for weakness.  Psychiatric/Behavioral: Positive for depression.    Blood pressure 147/85, pulse 86, temperature 99.2 F (37.3 C), temperature source Oral, resp. rate 16, height 5\' 5"  (1.651 m), weight 206 lb 6 oz (93.611 kg), last menstrual period 06/26/2011, SpO2 100.00%. Physical Exam  General: AlertAfrican American female who does not appear in distress Skin: Warm and dry without rash or infection HEENT: Oropharynx clear. Sclera nonicteric. Pupils equal and reactive. No masses. Lymph nodes: No cervical, subcuticular, or inguinal nodes palpable Lungs: Clear without wheezing or increased work of breathing Cardiovascular: Regular rate and rhythm without murmur. No JVD. Peripheral pulses intact. Abdomen: Nondistended. Well-healed laparoscopic incisions. There is mild epigastric tenderness without guarding. No discernible masses organomegaly. No hernias palpable. Extremities: No edema. Warm and well perfused. Neurologic: Alert and fully oriented. Motor and sensory exams grossly normal-appearing Assessment/Plan Persistent nausea and vomiting 3 weeks following laparoscopic Roux-en-Y gastric bypass. She does not appear acutely ill and her abdomen is benign on exam. Initial lab work is unremarkable. She could have persistent ileus but we need to rule out obstruction, or early marginal ulcer or stenosis. We'll admit and obtain CT scan of the abdomen and pelvis. Symptomatic management. We'll start IV proton pump inhibitors. Consider upper  endoscopy.  Yancy Knoble T 07/21/2011, 10:03 AM

## 2011-07-21 NOTE — ED Provider Notes (Signed)
History     CSN: [id number]  Arrival date & time 07/21/11  0812   First MD Initiated Contact with Patient 07/21/11 [phone]      Chief Complaint  Patient presents with  . Nausea  . Emesis    (Consider location/radiation/quality/duration/timing/severity/associated sxs/prior treatment) HPI Comments: History of Laparoscopic roux-en-Y gastric bypass on January 15.  Was doing well with fluids initially, but since having diet advanced to solids, she has begun vomiting, unable to keep anything down.  States she feels weak and run down.  Patient is a 41 y.o. female presenting with vomiting. The history is provided by the patient.  Emesis  This is a chronic problem. The current episode started more than 1 week ago. The problem occurs 5 to 10 times per day. The problem has been gradually worsening. The emesis has an appearance of stomach contents. There has been no fever. Associated symptoms include abdominal pain. Pertinent negatives include no chills, no diarrhea and no fever.    Past Medical History  Diagnosis Date  . Arthritis   . Asthma   . GERD (gastroesophageal reflux disease)   . Diabetes mellitus   . Hyperlipidemia   . Migraines   . Hypertension   . Back pain, chronic   . Depression   . Neuropathy     Past Surgical History  Procedure Date  . Back surgery November 1992  . Cesarean section December 1998  . Tubal ligation January 1999  . Cholecystectomy 1998  . Gastric roux-en-y 06/30/2011    Procedure: LAPAROSCOPIC ROUX-EN-Y GASTRIC;  Surgeon: Mariella Saa, MD;  Location: [facility];  Service: General;  Laterality: N/A;       Family History  Problem Relation Age of Onset  . Diabetes Maternal Aunt   . Diabetes Paternal Aunt   . Diabetes Paternal Grandfather     History  Substance Use Topics  . Smoking status: Never Smoker   . Smokeless tobacco: Never Used  . Alcohol Use: No    OB History    Grav Para Term Preterm Abortions TAB SAB Ect Mult Living                  Review of Systems  Constitutional: Negative for fever and chills.  Gastrointestinal: Positive for vomiting and abdominal pain. Negative for diarrhea.  All other systems reviewed and are negative.    Allergies  Review of patient's allergies indicates no known allergies.  Home Medications   Current Outpatient Rx  Name Route Sig Dispense Refill  . ALBUTEROL SULFATE HFA 108 (90 BASE) MCG/ACT IN AERS Inhalation Inhale 2 puffs into the lungs every 6 (six) hours as needed. WHEEZING     . BACLOFEN 10 MG PO TABS Oral Take 10 mg by mouth 3 (three) times daily as needed. PAIN     . BD PEN NEEDLE ULTRAFINE 29G X 12.7MM MISC      . CLEVER CHEK TEST VI STRP      . CYCLOBENZAPRINE HCL 10 MG PO TABS Oral Take 10 mg by mouth 3 (three) times daily as needed. MUSCLE SPASM     . DULOXETINE HCL 60 MG PO CPEP Oral Take 60 mg by mouth 2 (two) times daily.     Marland Kitchen HYDROCODONE-ACETAMINOPHEN 5-325 MG PO TABS Oral Take 1 tablet by mouth every 6 (six) hours as needed. PAIN     . INSULIN GLARGINE 100 UNIT/ML [place] SOLN Subcutaneous Inject 20 Units into the skin at bedtime.     Marland Kitchen LANCETS MISC      .  LISINOPRIL-HYDROCHLOROTHIAZIDE 20-12.5 MG PO TABS Oral Take 1 tablet by mouth every morning.     Marland Kitchen MONTELUKAST SODIUM 10 MG PO TABS Oral Take 10 mg by mouth daily before breakfast.       BP 147/85  Pulse 86  Temp(Src) 99.2 F (37.3 C) (Oral)  Resp 16  Ht 5\' 5"  (1.651 m)  Wt 206 lb 6 oz (93.611 kg)  BMI 34.34 kg/m2  SpO2 100%  LMP 06/26/2011  Physical Exam  Nursing note and vitals reviewed. Constitutional: She is oriented to person, place, and time. She appears well-developed and well-nourished. No distress.  HENT:  Head: Normocephalic and atraumatic.  Neck: Normal range of motion. Neck supple.  Cardiovascular: Normal rate and regular rhythm.   No murmur heard. Pulmonary/Chest: Effort normal and breath sounds normal. No respiratory distress. She has no wheezes.  Abdominal: Soft. Bowel sounds are  normal. She exhibits no distension.       There is ttp in all four quadrants.  No rebound, guarding.  Musculoskeletal: Normal range of motion. She exhibits no edema.  Neurological: She is alert and oriented to person, place, and time.  Skin: Skin is warm and dry. She is not diaphoretic.    ED Course  Procedures (including critical care time)  Labs Reviewed  CBC - Abnormal; Notable for the following:    Hemoglobin 11.4 (*)    HCT 35.2 (*)    All other components within normal limits  DIFFERENTIAL  COMPREHENSIVE METABOLIC PANEL  LIPASE, BLOOD   No results found.   No diagnosis found.   Date: 07/21/2011  Rate: 93   Rhythm: normal sinus rhythm  QRS Axis: normal  Intervals: normal  ST/T Wave abnormalities: normal  Conduction Disutrbances:none  Narrative Interpretation:   Old EKG Reviewed: unchanged    MDM  I consulted Dr. Johna Sheriff who came to see the patient in the ED.  He will be admitting her to the hospital.          Geoffery Lyons, MD 07/21/11 1027

## 2011-07-21 NOTE — ED Notes (Signed)
Pt resting in bed and talking on the phone. Pt alert and oriented x4. Respirations even and unlabored, bilateral symmetrical rise and fall of chest. Skin warm and dry. In no acute distress. Denies needs. Pt reports that "she has a broken heart and that's why her chest has been hurting".

## 2011-07-21 NOTE — Progress Notes (Signed)
Dr. Johna Sheriff notified me of pts admission with nausea and vomiting post gastric bypass on 06/30/11.  In to see pt; pt states she can't keep anything down, even water; did well initially with protein shakes and then on 2/2 started with nausea and vomiting after attempting to eat or drink anything; no fever; denies abdominal pain; c/o diarrhea after drinking shakes; states she generally doesn't feel well and feels weak; voiding without difficulty; pt states she has lost 31 pounds since her surgery; emotional support given; awaiting CT scan of abdomen and pelvis; will continue to monitor pts status during hospitalization. Talmadge Chad, RN Bariatric Nurse Coordinator

## 2011-07-22 LAB — GLUCOSE, CAPILLARY
Glucose-Capillary: 155 mg/dL — ABNORMAL HIGH (ref 70–99)
Glucose-Capillary: 169 mg/dL — ABNORMAL HIGH (ref 70–99)
Glucose-Capillary: 182 mg/dL — ABNORMAL HIGH (ref 70–99)

## 2011-07-22 NOTE — Progress Notes (Signed)
Pt in shower upon initial visit this am; pt alert and oriented; states she still feels tired and weak but a little better today; tolerating ice chips well; denies any nausea or vomiting; denies pain; voiding well; ambulating without difficulty; will continue with current plan of care until Dr. Johna Sheriff evaluates; emotional support given. Talmadge Chad, RN Bariatric Nurse coordinator

## 2011-07-22 NOTE — Progress Notes (Signed)
Patient ID: [name], female   DOB: 12-31-1970, 41 y.o.   MRN: [id number]    Subjective: Denies nausea this morning. Feels a little bit hungry. She has some leg pain which is chronic from diabetic neuropathy. Still feels weak.  Objective: Vital signs in last 24 hours: Temp:  [97 F (36.1 C)-98.7 F (37.1 C)] 97 F (36.1 C) (02/06 1000) Pulse Rate:  [93-103] 103  (02/06 1000) Resp:  [16-18] 18  (02/06 1000) BP: (139-158)/(82-95) 154/82 mmHg (02/06 1045) SpO2:  [97 %-100 %] 98 % (02/06 1000) Last BM Date: 07/21/11  Intake/Output from previous day: 02/05 0701 - 02/06 0700 In: 260 [I.V.:260] Out: 450 [Urine:450] Intake/Output this shift: Total I/O In: -  Out: 500 [Urine:500]  General appearance: alert and no distress GI: normal findings: bowel sounds normal and soft, non-tender  Lab Results:   Basename 07/21/11 0915  WBC 6.0  HGB 11.4*  HCT 35.2*  PLT 385   BMET  Basename 07/21/11 0915  NA 141  K 3.8  CL 107  CO2 22  GLUCOSE 189*  BUN 14  CREATININE 0.59  CALCIUM 9.4   PT/INR No results found for this basename: LABPROT:2,INR:2 in the last 72 hours ABG No results found for this basename: PHART:2,PCO2:2,PO2:2,HCO3:2 in the last 72 hours  Studies/Results: Ct Abdomen Pelvis W Contrast  07/21/2011  *RADIOLOGY REPORT*  Clinical Data: Nausea, vomiting, post gastric bypass surgery 3 weeks ago, elevated lipase  CT ABDOMEN AND PELVIS WITH CONTRAST  Technique:  Multidetector CT imaging of the abdomen and pelvis was performed following the standard protocol during bolus administration of intravenous contrast. Sagittal and coronal MPR images reconstructed from axial data set.  Contrast: OMNIPAQUE IOHEXOL 300 MG/ML IV SOLN.  Oral contrast administered.  Comparison: None  Findings: Lung bases clear. Liver, spleen, pancreas, kidneys, and adrenal glands normal appearance. Post gastric bypass surgery with patent gastrojejunostomy anastomosis. Gastric remnant  decompressed. Large and small bowel loops normal appearance. Very small amount of nonspecific low attenuation free pelvic fluid. Probable collapsed right ovarian cyst 1.6 cm diameter. Unremarkable bladder, uterus and left adnexa. Normal appendix.  Gallbladder surgically absent. Small subcutaneous fluid collection lateral left mid abdomen, 2.9 x 1.2 cm image 40. Minimal subcutaneous infiltration is seen at laparoscopy port sites. Tiny supraumbilical and umbilical hernias containing fat. No mass, adenopathy, free fluid, or inflammatory process. Retroaortic left renal vein. Degenerative disc disease changes lower lumbar spine.  IMPRESSION: Postsurgical changes from gastric bypass. No acute intra-abdominal abnormalities. Small subcutaneous fluid collection left mid abdomen 2.9 x 1.2 cm, nonspecific. Tiny umbilical and supraumbilical hernias.  Original Report Authenticated By: Lollie Marrow, M.D.    Anti-infectives: Anti-infectives    None      Assessment/Plan: Nausea and weakness post-gastric bypass. I do not see any evidence of specific complications such as stenosis or obstruction or infection. We will restart protein shakes today and see how she does.    LOS: 1 day    Witt Plitt T 07/22/2011

## 2011-07-23 ENCOUNTER — Ambulatory Visit (INDEPENDENT_AMBULATORY_CARE_PROVIDER_SITE_OTHER): Payer: Medicare HMO | Admitting: General Surgery

## 2011-07-23 LAB — GLUCOSE, CAPILLARY
Glucose-Capillary: 159 mg/dL — ABNORMAL HIGH (ref 70–99)
Glucose-Capillary: 147 mg/dL — ABNORMAL HIGH (ref 70–99)
Glucose-Capillary: 177 mg/dL — ABNORMAL HIGH (ref 70–99)

## 2011-07-23 MED ORDER — PROMETHAZINE HCL 25 MG/ML IJ SOLN
12.5000 mg | Freq: Four times a day (QID) | INTRAMUSCULAR | Status: DC | PRN
  Administered 2011-07-23 – 2011-07-24 (×5): 12.5 mg via INTRAVENOUS
  Administered 2011-07-25: 22:00:00 via INTRAVENOUS
  Administered 2011-07-25 – 2011-07-28 (×9): 12.5 mg via INTRAVENOUS
  Filled 2011-07-23 (×15): qty 1

## 2011-07-23 MED ORDER — SORBITOL 70 % SOLN
960.0000 mL | TOPICAL_OIL | Freq: Once | ORAL | Status: AC
  Administered 2011-07-23: 960 mL via RECTAL
  Filled 2011-07-23: qty 240

## 2011-07-23 NOTE — Progress Notes (Signed)
Pt alert and oriented; VSS; BS trending down; pt states she just doesn't feel good, feels constipated; last MB was 2/4; vomited this am and c/o nausea; attempting to tolerate bariatric full liquid diet; states warm fluids feel better; verbalized relief with prn pain meds; voiding without difficulty; possible endoscopy tomorrow; emotional support given. Lamarr Lulas BAriatric Nurse Coordinator

## 2011-07-23 NOTE — Progress Notes (Signed)
Patient ID: [name], female   DOB: [birth year], 41 y.o.   MRN: [id number]    Subjective: Doesn't "feel well" .  Feels constipated. Last bowel movement February 4. Initially she tolerated liquids yesterday and yesterday evening had recurrent nausea and vomiting. Still feels nauseated. Also some headache.  Objective: Vital signs in last 24 hours: Temp:  [97 F (36.1 C)-99 F (37.2 C)] 99 F (37.2 C) (02/07 0600) Pulse Rate:  [90-103] 90  (02/07 0600) Resp:  [16-18] 16  (02/07 0600) BP: (130-154)/(82-94) 153/92 mmHg (02/07 0600) SpO2:  [97 %-100 %] 99 % (02/07 0600) Last BM Date: 07/20/11  Intake/Output from previous day: 02/06 0701 - 02/07 0700 In: 510 [I.V.:500; IV Piggyback:10] Out: 1800 [Urine:1800] Intake/Output this shift:    General appearance: alert and no distress GI: normal findings: soft, non-tender  Lab Results:   Basename 07/21/11 0915  WBC 6.0  HGB 11.4*  HCT 35.2*  PLT 385   BMET  Basename 07/21/11 0915  NA 141  K 3.8  CL 107  CO2 22  GLUCOSE 189*  BUN 14  CREATININE 0.59  CALCIUM 9.4   PT/INR No results found for this basename: LABPROT:2,INR:2 in the last 72 hours ABG No results found for this basename: PHART:2,PCO2:2,PO2:2,HCO3:2 in the last 72 hours  Studies/Results: Ct Abdomen Pelvis W Contrast  07/21/2011  *RADIOLOGY REPORT*  Clinical Data: Nausea, vomiting, post gastric bypass surgery 3 weeks ago, elevated lipase  CT ABDOMEN AND PELVIS WITH CONTRAST  Technique:  Multidetector CT imaging of the abdomen and pelvis was performed following the standard protocol during bolus administration of intravenous contrast. Sagittal and coronal MPR images reconstructed from axial data set.  Contrast: OMNIPAQUE IOHEXOL 300 MG/ML IV SOLN.  Oral contrast administered.  Comparison: None  Findings: Lung bases clear. Liver, spleen, pancreas, kidneys, and adrenal glands normal appearance. Post gastric bypass surgery with patent gastrojejunostomy  anastomosis. Gastric remnant decompressed. Large and small bowel loops normal appearance. Very small amount of nonspecific low attenuation free pelvic fluid. Probable collapsed right ovarian cyst 1.6 cm diameter. Unremarkable bladder, uterus and left adnexa. Normal appendix.  Gallbladder surgically absent. Small subcutaneous fluid collection lateral left mid abdomen, 2.9 x 1.2 cm image 40. Minimal subcutaneous infiltration is seen at laparoscopy port sites. Tiny supraumbilical and umbilical hernias containing fat. No mass, adenopathy, free fluid, or inflammatory process. Retroaortic left renal vein. Degenerative disc disease changes lower lumbar spine.  IMPRESSION: Postsurgical changes from gastric bypass. No acute intra-abdominal abnormalities. Small subcutaneous fluid collection left mid abdomen 2.9 x 1.2 cm, nonspecific. Tiny umbilical and supraumbilical hernias.  Original Report Authenticated By: Lollie Marrow, M.D.    Anti-infectives: Anti-infectives    None      Assessment/Plan: Nausea and vomiting 3 weeks post gastric bypass. No evidence of obstruction on CT scan. Her abdomen seems benign. Question persistent ileus.? Ulcer, on PPI. May need EGD for further evaluation.    LOS: 2 days    Shakeel Disney T 07/23/2011

## 2011-07-24 LAB — COMPREHENSIVE METABOLIC PANEL
BUN: 3 mg/dL — ABNORMAL LOW (ref 6–23)
CO2: 23 mEq/L (ref 19–32)
Calcium: 8.7 mg/dL (ref 8.4–10.5)
GFR calc Af Amer: >90 mL/min (ref >90)
GFR calc non Af Amer: >90 mL/min (ref >90)
Glucose, Bld: 127 mg/dL — ABNORMAL HIGH (ref 70–99)
Total Protein: 6 g/dL (ref 6.0–8.3)

## 2011-07-24 LAB — CBC
HCT: 30.5 % — ABNORMAL LOW (ref 36.0–46.0)
Hemoglobin: 10 g/dL — ABNORMAL LOW (ref 12.0–15.0)
MCH: 26.4 pg (ref 26.0–34.0)
MCHC: 32.8 g/dL (ref 30.0–36.0)
MCV: 80.5 fL (ref 78.0–100.0)
RBC: 3.79 MIL/uL — ABNORMAL LOW (ref 3.87–5.11)

## 2011-07-24 LAB — GLUCOSE, CAPILLARY
Glucose-Capillary: 124 mg/dL — ABNORMAL HIGH (ref 70–99)
Glucose-Capillary: 110 mg/dL — ABNORMAL HIGH (ref 70–99)
Glucose-Capillary: 171 mg/dL — ABNORMAL HIGH (ref 70–99)

## 2011-07-24 MED ORDER — ALPRAZOLAM 0.25 MG PO TABS
0.2500 mg | ORAL_TABLET | Freq: Three times a day (TID) | ORAL | Status: DC | PRN
  Administered 2011-07-24 – 2011-07-28 (×13): 0.25 mg via ORAL
  Filled 2011-07-24 (×13): qty 1

## 2011-07-24 MED ORDER — POLYETHYLENE GLYCOL 3350 17 G PO PACK
17.0000 g | PACK | Freq: Every day | ORAL | Status: DC | PRN
  Administered 2011-07-24: 17 g via ORAL
  Filled 2011-07-24 (×2): qty 1

## 2011-07-24 NOTE — Progress Notes (Signed)
Patient ID: [name], female   DOB: 06-Jan-1971, 41 y.o.   MRN: [id number]    Subjective:  Nausea a little better, tol CL yesterday without vomiting.  Feels a little nervous.  Denies pain Objective: Vital signs in last 24 hours: Temp:  [97.4 F (36.3 C)-98.6 F (37 C)] 98.6 F (37 C) (02/08 0600) Pulse Rate:  [92-94] 93  (02/08 0600) Resp:  [16-18] 18  (02/08 0600) BP: (126-157)/(78-97) 149/94 mmHg (02/08 0600) SpO2:  [96 %-99 %] 98 % (02/08 0600) Last BM Date: 07/20/11  Intake/Output from previous day: 02/07 0701 - 02/08 0700 In: 2440 [P.O.:240; I.V.:2200] Out: -  Intake/Output this shift:    General appearance: alert and no distress GI: normal findings: soft, non-tender  Lab Results:   Basename 07/24/11 0405 07/21/11 0915  WBC 3.7* 6.0  HGB 10.0* 11.4*  HCT 30.5* 35.2*  PLT 288 385   BMET  Basename 07/24/11 0405 07/21/11 0915  NA 137 141  K 3.8 3.8  CL 108 107  CO2 23 22  GLUCOSE 127* 189*  BUN 3* 14  CREATININE 0.54 0.59  CALCIUM 8.7 9.4  liver function tests Moderate elevation transaminases and alk phos, lipase 61  PT/INR No results found for this basename: LABPROT:2,INR:2 in the last 72 hours ABG No results found for this basename: PHART:2,PCO2:2,PO2:2,HCO3:2 in the last 72 hours  Studies/Results: No results found.  Anti-infectives: Anti-infectives    None      Assessment/Plan: N&V post RYGB.  Mod elevated LFTs. Seems a little better today Will ask GI to see re elevated LFTs and possible EGD if doesn't improve     LOS: 3 days    Lenon Kuennen T 07/24/2011

## 2011-07-24 NOTE — Consult Note (Signed)
Referring Provider: Randol Kern, MD Primary Care Physician:  [name], MD, MD Primary Gastroenterologist:  None  Reason for Consultation:  Nausea and vomiting status post recent Roux-en-Y gastric bypass bariatric surgery. Elevated liver chemistries.  HPI: [name] is a 41 y.o. female who is about one month status post a Roux-en-Y gastric bypass operation for morbid obesity, but who was admitted to the hospital several days ago because of food intolerance, characterized by nausea and vomiting of the bariatric full liquid diet. Dr. Johna Sheriff indicates that it is not rare to see this finding as a temporary problem, lasting up to several weeks, following bariatric surgery, but that occasionally it is a reflection of an underlying problem such as a marginal ulcer or stenosis. Since admission, the patient has actually been more tolerant of her liquid diet so he feels for now we can simply follow along, but he would like endoscopic evaluation if she fails to continue to improve.  Meanwhile, the patient has elevated liver chemistries. Although mildly elevated at baseline, these are substantially higher since admission several days ago.   Prior to her surgery, in December of 2012, the patient had an alkaline phosphatase of 87, AST 69, ALT 50. The values were fairly similar when admitted several days ago, with alkaline phosphatase 177, AST 87, ALT 71. However, today, they have risen substantially to an alkaline phosphatase of 329, AST 140, ALT 222. Bilirubin has remained normal.  Of note, the patient is remotely status post cholecystectomy. In addition CT did not show evidence of cirrhosis, hepatic steatosis, biliary ductal dilatation, focal parenchymal abnormalities in the liver, or any other source of elevated liver chemistries. The patient has not been on any medications since admission which would likely account for the rise noted in her liver chemistries.  The patient, as noted above,  has a baseline mild elevation of liver chemistries which have been attributed to her being overweight, with associated hepatic steatosis. However, the patient does have a history of multiple tattoos. She denies intravenous drug abuse or exposure to people with hepatitis.  The patient is remotely status post cholecystectomy in 1999. She does not have any biliary type pain. She has had a little bit of lower abdominal discomfort.  Past Medical History  Diagnosis Date  . Arthritis   . Asthma   . GERD (gastroesophageal reflux disease)   . Diabetes mellitus   . Hyperlipidemia   . Migraines   . Hypertension   . Back pain, chronic   . Depression   . Neuropathy     Past Surgical History  Procedure Date  . Back surgery November 1992  . Cesarean section December 1998  . Tubal ligation January 1999  . Cholecystectomy 1998  . Gastric roux-en-y 06/30/2011    Procedure: LAPAROSCOPIC ROUX-EN-Y GASTRIC;  Surgeon: Mariella Saa, MD;  Location: [facility];  Service: General;  Laterality: N/A;       Prior to Admission medications   Medication Sig Start Date End Date Taking? Authorizing Provider  albuterol (PROVENTIL HFA;VENTOLIN HFA) 108 (90 BASE) MCG/ACT inhaler Inhale 2 puffs into the lungs every 6 (six) hours as needed. WHEEZING    Yes Historical Provider, MD  baclofen (LIORESAL) 10 MG tablet Take 10 mg by mouth 3 (three) times daily as needed. PAIN    Yes Historical Provider, MD  BD ULTRA-FINE PEN NEEDLES 29G X 12.7MM MISC  01/01/11  Yes Historical Provider, MD  CLEVER CHEK TEST test strip  02/04/11  Yes Historical Provider, MD  cyclobenzaprine (FLEXERIL) 10 MG tablet Take 10 mg by mouth 3 (three) times daily as needed. MUSCLE SPASM    Yes Historical Provider, MD  DULoxetine (CYMBALTA) 60 MG capsule Take 60 mg by mouth 2 (two) times daily.    Yes Historical Provider, MD  HYDROcodone-acetaminophen (NORCO) 5-325 MG per tablet Take 1 tablet by mouth every 6 (six) hours as needed. PAIN    Yes  Historical Provider, MD  insulin glargine (LANTUS) 100 UNIT/ML injection Inject 20 Units into the skin at bedtime.    Yes Historical Provider, MD  Lancets MISC  02/07/11  Yes Historical Provider, MD  lisinopril-hydrochlorothiazide (PRINZIDE,ZESTORETIC) 20-12.5 MG per tablet Take 1 tablet by mouth every morning.    Yes Historical Provider, MD  montelukast (SINGULAIR) 10 MG tablet Take 10 mg by mouth daily before breakfast.    Yes Historical Provider, MD    Current Facility-Administered Medications  Medication Dose Route Frequency Provider Last Rate Last Dose  . 0.9 % NaCl with KCl 20 mEq/ L  infusion   Intravenous Continuous Mariella Saa, MD 100 mL/hr at 07/24/11 0428 100 mL at 07/24/11 0428  . albuterol (PROVENTIL HFA;VENTOLIN HFA) 108 (90 BASE) MCG/ACT inhaler 2 puff  2 puff Inhalation Q6H PRN Mariella Saa, MD      . ALPRAZolam Prudy Feeler) tablet 0.25 mg  0.25 mg Oral TID PRN Mariella Saa, MD   0.25 mg at 07/24/11 0943  . diphenhydrAMINE (BENADRYL) injection 25 mg  25 mg Intravenous Q4H PRN Ernestene Mention, MD   25 mg at 07/22/11 2136  . DULoxetine (CYMBALTA) DR capsule 60 mg  60 mg Oral BID Mariella Saa, MD   60 mg at 07/24/11 0952  . heparin injection 5,000 Units  5,000 Units Subcutaneous Q8H Mariella Saa, MD   5,000 Units at 07/24/11 1430  . lisinopril (PRINIVIL,ZESTRIL) tablet 20 mg  20 mg Oral Daily Geoffery Lyons, MD   20 mg at 07/24/11 1610   And  . hydrochlorothiazide (MICROZIDE) capsule 12.5 mg  12.5 mg Oral Daily Geoffery Lyons, MD   12.5 mg at 07/24/11 9604  . insulin aspart (novoLOG) injection 0-15 Units  0-15 Units Subcutaneous Q4H Mariella Saa, MD   2 Units at 07/24/11 1259  . insulin glargine (LANTUS) injection 10 Units  10 Units Subcutaneous QHS Mariella Saa, MD   10 Units at 07/23/11 2216  . montelukast (SINGULAIR) tablet 10 mg  10 mg Oral QAC breakfast Mariella Saa, MD   10 mg at 07/24/11 0834  . morphine 2 MG/ML injection 2-4 mg   2-4 mg Intravenous Q2H PRN Mariella Saa, MD   2 mg at 07/24/11 1431  . ondansetron (ZOFRAN) injection 4 mg  4 mg Intravenous Q6H PRN Mariella Saa, MD   4 mg at 07/23/11 1347  . pantoprazole (PROTONIX) injection 40 mg  40 mg Intravenous Q12H Mariella Saa, MD   40 mg at 07/24/11 0959  . polyethylene glycol (MIRALAX / GLYCOLAX) packet 17 g  17 g Oral Daily PRN Mariella Saa, MD   17 g at 07/24/11 1212  . promethazine (PHENERGAN) injection 12.5 mg  12.5 mg Intravenous Q6H PRN Liz Malady, MD   12.5 mg at 07/24/11 1540    Allergies as of 07/21/2011  . (No Known Allergies)    Family History  Problem Relation Age of Onset  . Diabetes Maternal Aunt   . Diabetes Paternal Aunt   . Diabetes Paternal Grandfather  History   Social History  . Marital Status: Single    Spouse Name: N/A    Number of Children: N/A  . Years of Education: N/A   Occupational History  . Not on file.   Social History Main Topics  . Smoking status: Never Smoker   . Smokeless tobacco: Never Used  . Alcohol Use: No  . Drug Use: No  . Sexually Active: No   Other Topics Concern  . Not on file   Social History Narrative  . No narrative on file    Review of Systems: Positive for low abdominal discomfort as noted, but no upper abdominal pain. She has had nausea and vomiting which began some time after going home from her bariatric surgery. No problem with constipation or diarrhea, apart for the fact that she has not had a bowel movement for the past 5 days or so, possibly related to her diminished food intake. Physical Exam: Vital signs in last 24 hours: Temp:  [97.4 F (36.3 C)-98.8 F (37.1 C)] 98.8 F (37.1 C) (02/08 1400) Pulse Rate:  [84-93] 84  (02/08 1400) Resp:  [16-18] 17  (02/08 1400) BP: (126-162)/(78-97) 150/84 mmHg (02/08 1400) SpO2:  [96 %-100 %] 100 % (02/08 1400) Last BM Date: 07/20/11 General:   Alert,  Well-developed, severely obese, pleasant and  cooperative in NAD Head:  Normocephalic and atraumatic. Eyes:  Sclera clear, no icterus.   Lungs:  Clear throughout to auscultation.   No wheezes, crackles, or rhonchi. No evident respiratory distress. Heart:   Regular rate and rhythm; no murmurs, clicks, rubs,  or gallops. Abdomen:  Soft, nontender, nontympanitic, and nondistended. No masses, hepatosplenomegaly or ventral hernias noted. Normal bowel sounds, without bruits, guarding, or rebound.   Rectal:  Empty rectal ampulla with a small amount of brown stool residue. No fecal impaction.   Msk:   Symmetrical without gross deformities. Extremities:   Without clubbing, cyanosis, or edema. Neurologic:  Alert and coherent;  grossly normal neurologically. Skin:  Intact without significant lesions or rashes. However, multiple tattoos noted Psych:   Alert and cooperative. Normal mood and affect.  Intake/Output from previous day: 02/07 0701 - 02/08 0700 In: 2440 [P.O.:240; I.V.:2200] Out: -  Intake/Output this shift:    Lab Results:  Basename 07/24/11 0405  WBC 3.7*  HGB 10.0*  HCT 30.5*  PLT 288   BMET  Basename 07/24/11 0405  NA 137  K 3.8  CL 108  CO2 23  GLUCOSE 127*  BUN 3*  CREATININE 0.54  CALCIUM 8.7   LFT  Basename 07/24/11 0405  PROT 6.0  ALBUMIN 2.7*  AST 140*  ALT 222*  ALKPHOS 329*  BILITOT 0.7  BILIDIR --  IBILI --   PT/INR No results found for this basename: LABPROT:2,INR:2 in the last 72 hours   Studies/Results: No results found.  Impression:  1. Nausea and vomiting, gradually improving, in a patient recently status post Roux-en-Y gastric bypass. This probably reflect some element of nonspecific gastric dysmotility  2. Recent bump in liver chemistries, superimposed on mild baseline elevation, without obvious explanation. I suspect this reflects "reactive hepatopathy," basically, a nonspecific metabolic reaction of the liver to physiologic stress. However, it is conceivable that the patient has  an underlying hepatic problem such as hepatitis C  Plan:   1. I agree with Dr. Johna Sheriff in terms of continuing to observe the patient, with endoscopy on Monday if there is a significant ongoing problem with tolerance of full liquids.  2. For  the elevated liver chemistries, I will obtain multiple blood studies to screen for metabolic and infectious causes of liver disease, and also arrange an abdominal ultrasound to check for hepatic steatosis and/or biliary ductal dilatation that may not have been appreciated on the CT scan   LOS: 3 days   Navdeep Fessenden V  07/24/2011, 3:51 PM

## 2011-07-24 NOTE — Progress Notes (Signed)
Pt alert and oriented; states she feels a little better today; tolerated bariatric full liquid diet without vomiting; c/o nausea on and off but improved; +flatus; no BM; voiding without difficulty; ambulating well; ;pt states she is "craving cheese and crackers"; explained she could not have crackers at this time; Dr.Hoxworth called and order received for pt to have sliced cheese as she can tolerate; awaiting GI consult; emotional support given. Talmadge Chad, RN Bariatric Nurse Coordinato

## 2011-07-25 ENCOUNTER — Inpatient Hospital Stay (HOSPITAL_COMMUNITY): Payer: Medicare HMO

## 2011-07-25 LAB — COMPREHENSIVE METABOLIC PANEL
Albumin: 2.7 g/dL — ABNORMAL LOW (ref 3.5–5.2)
Alkaline Phosphatase: 296 U/L — ABNORMAL HIGH (ref 39–117)
BUN: 5 mg/dL — ABNORMAL LOW (ref 6–23)
Calcium: 9.2 mg/dL (ref 8.4–10.5)
GFR calc Af Amer: >90 mL/min (ref >90)
Glucose, Bld: 123 mg/dL — ABNORMAL HIGH (ref 70–99)
Potassium: 3.8 mEq/L (ref 3.5–5.1)
Sodium: 141 mEq/L (ref 135–145)
Total Protein: 6.4 g/dL (ref 6.0–8.3)

## 2011-07-25 LAB — HEPATITIS C ANTIBODY: HCV Ab: NEGATIVE

## 2011-07-25 LAB — GLUCOSE, CAPILLARY: Glucose-Capillary: 88 mg/dL (ref 70–99)

## 2011-07-25 LAB — HEPATITIS B SURFACE ANTIGEN: Hepatitis B Surface Ag: NEGATIVE

## 2011-07-25 MED ORDER — INSULIN ASPART 100 UNIT/ML ~~LOC~~ SOLN
0.0000 [IU] | Freq: Three times a day (TID) | SUBCUTANEOUS | Status: DC
  Administered 2011-07-25: 3 [IU] via SUBCUTANEOUS
  Administered 2011-07-26: 2 [IU] via SUBCUTANEOUS
  Administered 2011-07-26 (×2): 3 [IU] via SUBCUTANEOUS
  Administered 2011-07-26: 5 [IU] via SUBCUTANEOUS
  Administered 2011-07-27: 3 [IU] via SUBCUTANEOUS
  Administered 2011-07-27: 5 [IU] via SUBCUTANEOUS
  Administered 2011-07-27: 3 [IU] via SUBCUTANEOUS
  Administered 2011-07-28: 2 [IU] via SUBCUTANEOUS
  Filled 2011-07-25: qty 3

## 2011-07-25 MED ORDER — BISACODYL 10 MG RE SUPP
10.0000 mg | RECTAL | Status: AC
  Administered 2011-07-25: 10 mg via RECTAL
  Filled 2011-07-25: qty 1

## 2011-07-25 MED ORDER — POLYETHYLENE GLYCOL 3350 17 G PO PACK
17.0000 g | PACK | Freq: Three times a day (TID) | ORAL | Status: DC
  Administered 2011-07-25 – 2011-07-28 (×4): 17 g via ORAL
  Filled 2011-07-25 (×11): qty 1

## 2011-07-25 NOTE — Progress Notes (Signed)
Patient was dissatisfied with meal sent from dietary for dinner. Patient request chicken salad and boiled eggs for dinner instead. Chicken salad made with mayonnaise, celery, and green peas.  Dr. Michaell Cowing return page.  Diet changed to bland  so that patient can eat these foods for dinner tonight. Dr. Michaell Cowing relay surgeon to address diet in a.m.

## 2011-07-25 NOTE — Progress Notes (Signed)
Patient ID: [name], female   DOB: 1971/04/09, 41 y.o.   MRN: [id number]  General Surgery - [facility] - Progress Note  Subjective: Patient sitting in bed.  Wants to advance diet.  Mild nausea.  Mild pain.  Ambulated.  Constipated.  Objective: Vital signs in last 24 hours: Temp:  [98 F (36.7 C)-98.8 F (37.1 C)] 98.3 F (36.8 C) (02/09 0530) Pulse Rate:  [84-92] 87  (02/09 0530) Resp:  [17-18] 18  (02/09 0530) BP: (150-164)/(84-96) 164/94 mmHg (02/09 1039) SpO2:  [100 %] 100 % (02/09 0530) Last BM Date: 07/20/11  Intake/Output from previous day: 02/08 0701 - 02/09 0700 In: 2105 [I.V.:2105] Out: 500 [Urine:500]  Exam: HEENT - clear, not icteric Neck - soft Chest - clear bilaterally Cor - RRR, no murmur Abd - obese, soft; non-tender; BS present Ext - no significant edema Neuro - grossly intact, no focal deficits  Lab Results:   Basename 07/24/11 0405  WBC 3.7*  HGB 10.0*  HCT 30.5*  PLT 288     Basename 07/25/11 0305 07/24/11 0405  NA 141 137  K 3.8 3.8  CL 108 108  CO2 25 23  GLUCOSE 123* 127*  BUN 5* 3*  CREATININE 0.52 0.54  CALCIUM 9.2 8.7    Studies/Results: US Abdomen Complete  07/25/2011  *RADIOLOGY REPORT*  Clinical Data:  Elevated liver enzymes.  Nausea and vomiting. History of gastric bypass  ABDOMINAL ULTRASOUND COMPLETE  Comparison:  None.  Findings:  Gallbladder:  Prior cholecystectomy.  Common Bile Duct:  Within normal limits in caliber.  Liver: Slightly coarsened echotexture suggesting fatty infiltration.  No focal mass.  IVC:  Appears normal.  Pancreas:  Diminished exam detail.  Spleen:  Within normal limits in size and echotexture.  Right kidney:  Normal in size and parenchymal echogenicity.  No evidence of mass or hydronephrosis.  Left kidney:  Normal in size and parenchymal echogenicity.  No evidence of mass or hydronephrosis.  Abdominal Aorta:  No aneurysm identified.  IMPRESSION:  1.  No acute findings. 2.  Prior  cholecystectomy.  Original Report Authenticated By: Rosealee Albee, M.D.    Assessment: Post bypass nausea, abd pain, abnormal LFT's  Plan: Appreciate GI evaluation Will advance diet per patient request and OK'd by Dr. Johna Sheriff Dulcolax supp for constipation Ambulate in halls   Velora Heckler, MD, [facility] General & Endocrine Surgery [facility]  07/25/2011

## 2011-07-25 NOTE — Progress Notes (Signed)
Subjective: Some nausea. Is hungry.  Is constipated.  Objective: Vital signs in last 24 hours: Temp:  [98 F (36.7 C)-98.8 F (37.1 C)] 98.3 F (36.8 C) (02/09 0530) Pulse Rate:  [84-92] 87  (02/09 0530) Resp:  [17-18] 18  (02/09 0530) BP: (150-164)/(84-96) 164/94 mmHg (02/09 1039) SpO2:  [100 %] 100 % (02/09 0530) Weight change:  Last BM Date: 07/20/11  PE: GEN:  NAD HEENT:  Slightly dry MM ABD:  Soft   Assessment:  1.  Nausea without vomiting. 2.  Constipation. 3.  Recent gastric bypass.  Plan:  1.  Continue prn antiemetics.  Continue PPI therapy. 2.  Treat constipation. 3.  Advance diet, at direction of [facility] post gastric bypass. 4.  If symptoms persist despite antiemetics and bowel regulation, consider endoscopy (to assess for anastomotic ulcer or stricturing).   Freddy Jaksch 07/25/2011, 10:45 AM

## 2011-07-26 ENCOUNTER — Inpatient Hospital Stay (HOSPITAL_COMMUNITY): Payer: Medicare HMO

## 2011-07-26 LAB — GLUCOSE, CAPILLARY
Glucose-Capillary: 146 mg/dL — ABNORMAL HIGH (ref 70–99)
Glucose-Capillary: 215 mg/dL — ABNORMAL HIGH (ref 70–99)
Glucose-Capillary: 155 mg/dL — ABNORMAL HIGH (ref 70–99)

## 2011-07-26 MED ORDER — BISACODYL 10 MG RE SUPP
10.0000 mg | RECTAL | Status: AC
  Administered 2011-07-26: 10 mg via RECTAL
  Filled 2011-07-26: qty 1

## 2011-07-26 MED ORDER — SUCRALFATE 1 GM/10ML PO SUSP
1.0000 g | Freq: Three times a day (TID) | ORAL | Status: DC
  Administered 2011-07-26 – 2011-07-28 (×7): 1 g via ORAL
  Filled 2011-07-26 (×11): qty 10

## 2011-07-26 MED ORDER — BISACODYL 5 MG PO TBEC
5.0000 mg | DELAYED_RELEASE_TABLET | Freq: Two times a day (BID) | ORAL | Status: DC
  Administered 2011-07-26 (×2): 5 mg via ORAL
  Filled 2011-07-26 (×2): qty 1

## 2011-07-26 NOTE — Progress Notes (Signed)
Subjective: Eating more solid foods.  Still nauseated, but no vomiting. Lower abdominal pain and constipation persists.  Objective: Vital signs in last 24 hours: Temp:  [98 F (36.7 C)-98.4 F (36.9 C)] 98.1 F (36.7 C) (02/10 0557) Pulse Rate:  [87-106] 87  (02/10 0557) Resp:  [18] 18  (02/10 0557) BP: (138-164)/(90-94) 159/94 mmHg (02/10 0557) SpO2:  [97 %-99 %] 97 % (02/10 0557) Weight change:  Last BM Date: 07/25/11  PE: GEN:  NAD ABD:  Some, mild lower abdominal tendnerness   Assessment:  1.  Nausea without vomiting.  Constipation might be at least playing some role. 2.  Constipation. 3.  Elevated Liver tests.  Suspect fatty liver.  Plan:  1.  Diet per [facility]. 2.  Continue PPI. 3.  Start Carafate. 4.  Intensify bowel regimen, but reluctant to administer full bowel prep due to recent gastric bypass surgery. 5.  Will discuss with Dr. Johna Sheriff tomorrow to discuss need for and utility of endoscopy in this setting.   Freddy Jaksch 07/26/2011, 8:59 AM

## 2011-07-26 NOTE — Progress Notes (Signed)
Patient ID: [name], female   DOB: 12-19-1970, 41 y.o.   MRN: [id number]  General Surgery - [facility] - Progress Note  Subjective: Patient complains of mild pain and nausea.  States had episode of emesis this AM.  Working word puzzle now.  Wants to continue on regular diet.  Wants to continue on Xanax.  Small results with Dulcolax yesterday - would like to repeat today.  Objective: Vital signs in last 24 hours: Temp:  [98 F (36.7 C)-98.4 F (36.9 C)] 98.1 F (36.7 C) (02/10 0557) Pulse Rate:  [87-106] 87  (02/10 0557) Resp:  [18] 18  (02/10 0557) BP: (138-164)/(90-94) 159/94 mmHg (02/10 0557) SpO2:  [97 %-99 %] 97 % (02/10 0557) Last BM Date: 07/25/11  Intake/Output from previous day: 02/09 0701 - 02/10 0700 In: 460 [P.O.:460] Out: 701 [Urine:700; Stool:1]  Exam: HEENT - clear, not icteric Neck - soft Chest - clear bilaterally Cor - RRR, no murmur Abd - soft, obese; BS present; no tenderness; no mass Ext - no significant edema Neuro - grossly intact, no focal deficits  Lab Results:   Basename 07/24/11 0405  WBC 3.7*  HGB 10.0*  HCT 30.5*  PLT 288     Basename 07/25/11 0305 07/24/11 0405  NA 141 137  K 3.8 3.8  CL 108 108  CO2 25 23  GLUCOSE 123* 127*  BUN 5* 3*  CREATININE 0.52 0.54  CALCIUM 9.2 8.7    Studies/Results: US Abdomen Complete  07/25/2011  *RADIOLOGY REPORT*  Clinical Data:  Elevated liver enzymes.  Nausea and vomiting. History of gastric bypass  ABDOMINAL ULTRASOUND COMPLETE  Comparison:  None.  Findings:  Gallbladder:  Prior cholecystectomy.  Common Bile Duct:  Within normal limits in caliber.  Liver: Slightly coarsened echotexture suggesting fatty infiltration.  No focal mass.  IVC:  Appears normal.  Pancreas:  Diminished exam detail.  Spleen:  Within normal limits in size and echotexture.  Right kidney:  Normal in size and parenchymal echogenicity.  No evidence of mass or hydronephrosis.  Left kidney:  Normal in size  and parenchymal echogenicity.  No evidence of mass or hydronephrosis.  Abdominal Aorta:  No aneurysm identified.  IMPRESSION:  1.  No acute findings. 2.  Prior cholecystectomy.  Original Report Authenticated By: Rosealee Albee, M.D.   Dg Abd 2 Views  07/26/2011  *RADIOLOGY REPORT*  Clinical Data: Abdominal pain, constipation  ABDOMEN - 2 VIEW  Comparison: 07/21/2011  Findings: There is nonspecific nonobstructive bowel gas pattern. Post cholecystectomy surgical clips are noted.  Some stool noted in the right colon.  Postsurgical changes noted midline upper abdomen. No free abdominal air is noted.  IMPRESSION: Nonspecific nonobstructive bowel gas pattern.  Status post cholecystectomy.  Some stool noted in the right colon.  Original Report Authenticated By: Natasha Mead, M.D.    Assessment: Abdominal pain and nausea after bypass procedure  Plan: Continue regular diet per patient request Dulcolax this AM Rx nausea, pain, anxiety Per GI - to discuss EGD with Dr. Johna Sheriff in AM 2/11   Velora Heckler, MD, FACS General & Endocrine Surgery [facility]  07/26/2011

## 2011-07-27 ENCOUNTER — Encounter (HOSPITAL_COMMUNITY)
Admission: EM | Disposition: A | Discharge status: Home / Self Care | Payer: Self-pay | Source: Home / Self Care | Attending: General Surgery

## 2011-07-27 HISTORY — PX: ESOPHAGOGASTRODUODENOSCOPY: SHX5428

## 2011-07-27 LAB — GLUCOSE, CAPILLARY: Glucose-Capillary: 170 mg/dL — ABNORMAL HIGH (ref 70–99)

## 2011-07-27 LAB — ANA: Anti Nuclear Antibody(ANA): NEGATIVE

## 2011-07-27 LAB — ANTI-SMOOTH MUSCLE ANTIBODY, IGG: F-Actin IgG: 4 U (ref <20)

## 2011-07-27 SURGERY — EGD (ESOPHAGOGASTRODUODENOSCOPY)
Anesthesia: Moderate Sedation

## 2011-07-27 MED ORDER — MIDAZOLAM HCL 10 MG/2ML IJ SOLN
INTRAMUSCULAR | Status: AC
  Filled 2011-07-27: qty 4

## 2011-07-27 MED ORDER — DIPHENHYDRAMINE HCL 50 MG/ML IJ SOLN
INTRAMUSCULAR | Status: AC
  Filled 2011-07-27: qty 1

## 2011-07-27 MED ORDER — FENTANYL CITRATE 0.05 MG/ML IJ SOLN
INTRAMUSCULAR | Status: AC
  Filled 2011-07-27: qty 4

## 2011-07-27 MED ORDER — BISACODYL 10 MG RE SUPP
10.0000 mg | Freq: Every day | RECTAL | Status: DC | PRN
  Administered 2011-07-27: 10 mg via RECTAL
  Filled 2011-07-27: qty 1

## 2011-07-27 MED ORDER — SODIUM CHLORIDE 0.9 % IV SOLN: Freq: Once | INTRAVENOUS | Status: DC

## 2011-07-27 MED ORDER — BUTAMBEN-TETRACAINE-BENZOCAINE 2-2-14 % EX AERO
INHALATION_SPRAY | CUTANEOUS | Status: DC | PRN
  Administered 2011-07-27: 2 via TOPICAL

## 2011-07-27 MED ORDER — DIPHENHYDRAMINE HCL 50 MG/ML IJ SOLN
INTRAMUSCULAR | Status: DC | PRN
  Administered 2011-07-27: 25 mg via INTRAVENOUS

## 2011-07-27 MED ORDER — FENTANYL NICU IV SYRINGE 50 MCG/ML
INJECTION | INTRAMUSCULAR | Status: DC | PRN
  Administered 2011-07-27 (×2): 25 ug via INTRAVENOUS

## 2011-07-27 MED ORDER — MIDAZOLAM HCL 10 MG/2ML IJ SOLN
INTRAMUSCULAR | Status: DC | PRN
  Administered 2011-07-27 (×3): 2 mg via INTRAVENOUS

## 2011-07-27 MED ORDER — PNEUMOCOCCAL VAC POLYVALENT 25 MCG/0.5ML IJ INJ
0.5000 mL | INJECTION | INTRAMUSCULAR | Status: AC
  Administered 2011-07-28: 0.5 mL via INTRAMUSCULAR
  Filled 2011-07-27: qty 0.5

## 2011-07-27 NOTE — Interval H&P Note (Signed)
History and Physical Interval Note:  07/27/2011 9:09 AM  [name]  has presented today for surgery, with the diagnosis of abd pain s/p bariatric sx  The various methods of treatment have been discussed with the patient and family. After consideration of risks, benefits and other options for treatment, the patient has consented to  Procedure(s): ESOPHAGOGASTRODUODENOSCOPY (EGD) as a surgical intervention .  The patients' history has been reviewed, patient examined, no change in status, stable for surgery.  I have reviewed the patients' chart and labs.  Questions were answered to the patient's satisfaction.     Freddy Jaksch  Risks (bleeding, infection, bowel perforation that could require surgery, sedation-related changes in cardiopulmonary systems), benefits (identification and possible treatment of source of symptoms, exclusion of certain causes of symptoms), and alternatives (watchful waiting, radiographic imaging studies, empiric medical treatment) of upper endoscopy (EGD) were explained to patient in detail and she wishes to proceed.

## 2011-07-27 NOTE — Progress Notes (Signed)
Subjective: Increase in nausea/vomiting. Some constipation persists.  Objective: Vital signs in last 24 hours: Temp:  [98.3 F (36.8 C)-98.6 F (37 C)] 98.5 F (36.9 C) (02/11 0603) Pulse Rate:  [87-95] 87  (02/11 0603) Resp:  [18] 18  (02/11 0603) BP: (132-139)/(80-89) 139/88 mmHg (02/11 0603) SpO2:  [99 %-100 %] 99 % (02/11 0603) Weight change:  Last BM Date: 07/26/11  PE: GEN:  NAD ABD:  Soft, mild lower abdominal tenderness  CBC    Component Value Date/Time   WBC 3.7* 07/24/2011 0405   RBC 3.79* 07/24/2011 0405   HGB 10.0* 07/24/2011 0405   HCT 30.5* 07/24/2011 0405   PLT 288 07/24/2011 0405   MCV 80.5 07/24/2011 0405   MCH 26.4 07/24/2011 0405   MCHC 32.8 07/24/2011 0405   RDW 13.7 07/24/2011 0405   LYMPHSABS 1.9 07/21/2011 0915   MONOABS 0.4 07/21/2011 0915   EOSABS 0.3 07/21/2011 0915   BASOSABS 0.0 07/21/2011 0915   Assessment:  1.  Nausea/vomiting. 2.  Constipation. 3.  Elevated LFTs.  Suspect steatosis.  Plan:  1.  Continue antiemetic therapy. 2.  Change to dulcolax suppositories for constipation (after endoscopy is done). 3.  I have discussed case with Dr. Johna Sheriff.  We will proceed with endoscopy today to rule out anastomotic ulcer, stricture.   [name] 07/27/2011, 7:51 AM

## 2011-07-27 NOTE — Op Note (Signed)
[facility] [address] [place], [place]  [address]  ENDOSCOPY PROCEDURE REPORT  PATIENT:  [name], [name]  MR#:  [id number] BIRTHDATE:  12/13/1970, 40 yrs. old  GENDER:  female  ENDOSCOPIST:  Willis Modena, MD Referred by:  Glenna Fellows, M.D.  PROCEDURE DATE:  07/27/2011 PROCEDURE:  EGD, diagnostic 43235 ASA CLASS:  Class II INDICATIONS:  abdominal pain, nausea, vomiting, history gastric bypass  MEDICATIONS:   cetacaine spray x 2, Fentanyl 50 mcg IV, Versed 6 mg IV, Benadryl 25 mg IV  DESCRIPTION OF PROCEDURE:   After the risks benefits and alternatives of the procedure were thoroughly explained, informed consent was obtained.  The Pentax Gastroscope [id number] endoscope was introduced through the mouth and advanced to the proximal jejunum, without limitations.  The instrument was slowly withdrawn as the mucosa was fully examined.  <<PROCEDUREIMAGES>>  FINDINGS:  Normal esophagus without inflammation, stricture, mass, varices, or Barrett's mucosa.  Gastric pouch, post Roux-en-Y gastric bypass, appreciated (retroflexion into cardia was not performed)  There was some edema and ulceration around the anastomosis, without overt stricturing, and without large dominant ulcer, which I suspect is not atypical this soon post-operatively. Fair bit of suture material around anastomosis, unclear significance.  Proximal 5cm of efferent jejunostomy  limb normal. ENDOSCOPIC IMPRESSION:    1. Anastomotic edema and ulceration about gastrojejunostomy, which I suspect are routine post- operative and reparative changes. 2.  No large anastomotic ulcer. 3.  Normal esophagus and gastric pouch.  RECOMMENDATIONS:      1.  Watch for potential complications of procedure. 2.  Continue PPI and sucralfate. 3.  Dulcolax suppositories for constipation. 4.  I have discussed with Dr. Johna Sheriff.  REPEAT EXAM:  No  ______________________________ Willis Modena  CC:  n. eSIGNEDWillis Modena at 07/27/2011 09:53 AM  Bernadene Person, [phone]

## 2011-07-27 NOTE — Brief Op Note (Signed)
Please see [facility] note dated 07/27/11.

## 2011-07-27 NOTE — Progress Notes (Signed)
Patient ID: [name], female   DOB: 07/21/1970, 41 y.o.   MRN: [id number]    Subjective: Still with intermitant N&V unable to eat often.  No significant pain  Objective: Vital signs in last 24 hours: Temp:  [98.3 F (36.8 C)-98.6 F (37 C)] 98.5 F (36.9 C) (02/11 0603) Pulse Rate:  [87-95] 87  (02/11 0603) Resp:  [18] 18  (02/11 0603) BP: (132-139)/(80-89) 139/88 mmHg (02/11 0603) SpO2:  [99 %-100 %] 99 % (02/11 0603) Last BM Date: 07/26/11  Intake/Output from previous day: 02/10 0701 - 02/11 0700 In: 1472.6 [P.O.:840; I.V.:632.6] Out: 502 [Urine:502] Intake/Output this shift:    General appearance: alert and no distress GI: normal findings: soft, non-tender  Lab Results:  No results found for this basename: WBC:2,HGB:2,HCT:2,PLT:2 in the last 72 hours BMET  [facility] 07/25/11 0305  NA 141  K 3.8  CL 108  CO2 25  GLUCOSE 123*  BUN 5*  CREATININE 0.52  CALCIUM 9.2   PT/INR No results found for this basename: LABPROT:2,INR:2 in the last 72 hours ABG No results found for this basename: PHART:2,PCO2:2,PO2:2,HCO3:2 in the last 72 hours  Studies/Results: US Abdomen Complete  07/25/2011  *RADIOLOGY REPORT*  Clinical Data:  Elevated liver enzymes.  Nausea and vomiting. History of gastric bypass  ABDOMINAL ULTRASOUND COMPLETE  Comparison:  None.  Findings:  Gallbladder:  Prior cholecystectomy.  Common Bile Duct:  Within normal limits in caliber.  Liver: Slightly coarsened echotexture suggesting fatty infiltration.  No focal mass.  IVC:  Appears normal.  Pancreas:  Diminished exam detail.  Spleen:  Within normal limits in size and echotexture.  Right kidney:  Normal in size and parenchymal echogenicity.  No evidence of mass or hydronephrosis.  Left kidney:  Normal in size and parenchymal echogenicity.  No evidence of mass or hydronephrosis.  Abdominal Aorta:  No aneurysm identified.  IMPRESSION:  1.  No acute findings. 2.  Prior cholecystectomy.  Original Report  Authenticated By: Rosealee Albee, M.D.   Dg Abd 2 Views  07/26/2011  *RADIOLOGY REPORT*  Clinical Data: Abdominal pain, constipation  ABDOMEN - 2 VIEW  Comparison: 07/21/2011  Findings: There is nonspecific nonobstructive bowel gas pattern. Post cholecystectomy surgical clips are noted.  Some stool noted in the right colon.  Postsurgical changes noted midline upper abdomen. No free abdominal air is noted.  IMPRESSION: Nonspecific nonobstructive bowel gas pattern.  Status post cholecystectomy.  Some stool noted in the right colon.  Original Report Authenticated By: Natasha Mead, M.D.    Anti-infectives: Anti-infectives    None      Assessment/Plan: N&V post RYGB.  Discuseed with Dr Dulce Sellar and pt, will proceed with EGD today    LOS: 6 days    Emeril Stille T 07/27/2011

## 2011-07-27 NOTE — Progress Notes (Signed)
Patient complains of nausea and states she vomited 3 times 02/09 after dinner.  Nurse reporting denies measuring emesis and requested for patient to save.  Day nurse and tech did not witness emesis.  Patient eating regular diet and requesting peanut butter and sugar free popsciles.  Patient requiring nausea medication and pain medicine.  Instructed  patient on importance of bari diet and with nausea limit po intake.  Pt  Still taking regular diet with multiple snacks.  Patient working cross word puzzles and sitting in bed.  Md aware of above.

## 2011-07-27 NOTE — Progress Notes (Signed)
Inpatient Diabetes Program Recommendations  AACE/ADA: New Consensus Statement on Inpatient Glycemic Control (2009)  Target Ranges:  Prepandial:   less than 140 mg/dL      Peak postprandial:   less than 180 mg/dL (1-2 hours)      Critically ill patients:  140 - 180 mg/dL   Reason for Visit: Fasting hyperglycemia  Inpatient Diabetes Program Recommendations Insulin - Basal: Please increase Lantus to 20 units (basal insulin does not include meal coverage). (takes 20 units at home) Fastings are high.  Note: Thank you, Lenor Coffin, RN, CNS, Diabetes Coordinator [phone])

## 2011-07-27 NOTE — Progress Notes (Signed)
Pt requesting diet to be advanced; discussed with Nutritionist Herbert Seta; Dr.Hoxworth notified and orders received for Bariatric Advanced diet; RN aware and pt aware. Lamarr Lulas BAriatric Nurse Coordinator

## 2011-07-28 ENCOUNTER — Encounter (HOSPITAL_COMMUNITY): Payer: Self-pay | Admitting: Gastroenterology

## 2011-07-28 ENCOUNTER — Encounter (INDEPENDENT_AMBULATORY_CARE_PROVIDER_SITE_OTHER): Payer: Self-pay | Admitting: General Surgery

## 2011-07-28 ENCOUNTER — Telehealth (INDEPENDENT_AMBULATORY_CARE_PROVIDER_SITE_OTHER): Payer: Self-pay

## 2011-07-28 LAB — GLUCOSE, CAPILLARY: Glucose-Capillary: 167 mg/dL — ABNORMAL HIGH (ref 70–99)

## 2011-07-28 LAB — MITOCHONDRIAL ANTIBODIES: Mitochondrial M2 Ab, IgG: 0.47 (ref <0.91)

## 2011-07-28 MED ORDER — PROMETHAZINE HCL 12.5 MG PO TABS: 12.5000 mg | ORAL_TABLET | Freq: Four times a day (QID) | ORAL | Status: DC | PRN

## 2011-07-28 MED ORDER — OXYCODONE-ACETAMINOPHEN 5-325 MG/5ML PO SOLN: 5.0000 mL | ORAL | Status: AC | PRN

## 2011-07-28 MED ORDER — ALPRAZOLAM 0.25 MG PO TABS: 0.2500 mg | ORAL_TABLET | Freq: Two times a day (BID) | ORAL | Status: AC | PRN

## 2011-07-28 MED ORDER — PANTOPRAZOLE SODIUM 40 MG PO TBEC: 40.0000 mg | DELAYED_RELEASE_TABLET | Freq: Every day | ORAL | Status: DC

## 2011-07-28 NOTE — Discharge Summary (Signed)
Patient ID: [name] [id number] 41 y.o. 1970/08/22  07/21/2011  Discharge date and time: 07/28/2011   Admitting Physician: [name]  Discharge Physician: [name]  Admission Diagnoses: Abdominal pain [789.0] Vomiting [787.03] H/O gastric bypass [V45.86] vomiting;nausea;cant keep food down abd pain s/p bariatric sx  Discharge Diagnoses:Same  Operations: Procedure(s): ESOPHAGOGASTRODUODENOSCOPY (EGD)  Admission Condition: fair  Discharged Condition: fair  Indication for Admission: the patient is a 41 year old female 3 weeks following initially uncomplicated Roux-en-Y gastric bypass. She presents to the emergency room with persistent nausea and vomiting and intolerance of by mouth intake including fluids. She is also having some upper bowel pain which has not unusually severe. The patient is admitted for further workup.  Hospital Course: the patient was admitted and started on IV fluids and parenteral antibiotics and pain medication. Laboratory workup was unremarkable except for moderately elevated LFTs which had been noted preoperatively as well and were felt to secondary to hepatic steatosis. A CT scan of the abdomen and pelvis was obtained which was unremarkable. She improved somewhat with treatment but had enough persistent symptoms that we obtain a GI consult for possible endoscopy. Also asked him to evaluate LFTs. Infectious workup for hepatitis was all negative. An ultrasound of the liver was obtained which was negative except for steatosis. EGD was performed on February 10 which showed some expected edema and friability at the anastomosis but it was widely patent and there was no complication identified. At this point the patient was still having some intermittent nausea and occasional vomiting but generally was able to tolerate by mouth and wanted to occasional solid food. Her abdomen is benign. CBC is unremarkable LFTs are stable. Was felt she had  reached maximum hospital benefit. We elected to treat this as an outpatient and follow closely. She is given prescriptions for Phenergan as well as Protonix, Xanax and Roxicet. She is to concentrate on fluids and protein shakes with limited solid food.  Consults: GI  Significant Diagnostic Studies: radiology: CT scan: negative. Ultrasound: Negative.  Treatments: IV hydration  Disposition: Home  Patient Instructions:   Linda, [name]  Home Medication Instructions WUJ:[id number]   Printed on:07/28/11 0803  Medication Information                    lisinopril-hydrochlorothiazide (PRINZIDE,ZESTORETIC) 20-12.5 MG per tablet Take 1 tablet by mouth every morning.            DULoxetine (CYMBALTA) 60 MG capsule Take 60 mg by mouth 2 (two) times daily.            montelukast (SINGULAIR) 10 MG tablet Take 10 mg by mouth daily before breakfast.            cyclobenzaprine (FLEXERIL) 10 MG tablet Take 10 mg by mouth 3 (three) times daily as needed. MUSCLE SPASM            insulin glargine (LANTUS) 100 UNIT/ML injection Inject 20 Units into the skin at bedtime.            Lancets MISC            BD ULTRA-FINE PEN NEEDLES 29G X 12.7MM MISC            CLEVER CHEK TEST test strip            baclofen (LIORESAL) 10 MG tablet Take 10 mg by mouth 3 (three) times daily as needed. PAIN            albuterol (PROVENTIL HFA;VENTOLIN  HFA) 108 (90 BASE) MCG/ACT inhaler Inhale 2 puffs into the lungs every 6 (six) hours as needed. WHEEZING            HYDROcodone-acetaminophen (NORCO) 5-325 MG per tablet Take 1 tablet by mouth every 6 (six) hours as needed. PAIN            ALPRAZolam (XANAX) 0.25 MG tablet Take 1 tablet (0.25 mg total) by mouth 2 (two) times daily as needed for anxiety.           pantoprazole (PROTONIX) 40 MG tablet Take 1 tablet (40 mg total) by mouth daily.           promethazine (PHENERGAN) 12.5 MG tablet Take 1 tablet (12.5 mg total) by mouth every 6 (six) hours as  needed for nausea.           oxyCODONE-acetaminophen (ROXICET) 5-325 MG/5ML solution Take 5 mLs by mouth every 4 (four) hours as needed for pain.             Activity: activity as tolerated Diet: post bariatric, encourage fluids. Wound Care: none needed  Follow-up:  With Dr. Johna [name] in 2 weeks.  Signed: Mariella Saa MD, FACS  07/28/2011, 8:03 AM

## 2011-07-28 NOTE — Progress Notes (Signed)
Patient ID: [name], female   DOB: 18-Jul-1970, 41 y.o.   MRN: [id number] 1 Day Post-Op  Subjective: Still with intermittent nausea and pain but certainly no worse after her endoscopy. No vomiting overnight.  Objective: Vital signs in last 24 hours: Temp:  [97 F (36.1 C)-98.4 F (36.9 C)] 98.4 F (36.9 C) (02/12 0621) Pulse Rate:  [84-94] 84  (02/12 0621) Resp:  [6-20] 18  (02/12 0621) BP: (121-158)/(78-100) 121/79 mmHg (02/12 0621) SpO2:  [95 %-100 %] 98 % (02/12 0621) FiO2 (%):  [100 %] 100 % (02/11 0923) Last BM Date: 07/27/11  Intake/Output from previous day: 02/11 0701 - 02/12 0700 In: 480 [I.V.:480] Out: 250 [Urine:250] Intake/Output this shift:    General appearance: alert and no distress GI: normal findings: soft, non-tender  Lab Results:  No results found for this basename: WBC:2,HGB:2,HCT:2,PLT:2 in the last 72 hours BMET No results found for this basename: NA:2,K:2,CL:2,CO2:2,GLUCOSE:2,BUN:2,CREATININE:2,CALCIUM:2 in the last 72 hours PT/INR No results found for this basename: LABPROT:2,INR:2 in the last 72 hours ABG No results found for this basename: PHART:2,PCO2:2,PO2:2,HCO3:2 in the last 72 hours  Studies/Results: Dg Abd 2 Views  30-Jul-2011  *RADIOLOGY REPORT*  Clinical Data: Abdominal pain, constipation  ABDOMEN - 2 VIEW  Comparison: 07/21/2011  Findings: There is nonspecific nonobstructive bowel gas pattern. Post cholecystectomy surgical clips are noted.  Some stool noted in the right colon.  Postsurgical changes noted midline upper abdomen. No free abdominal air is noted.  IMPRESSION: Nonspecific nonobstructive bowel gas pattern.  Status post cholecystectomy.  Some stool noted in the right colon.  Original Report Authenticated By: Natasha Mead, M.D.    Anti-infectives: Anti-infectives    None      Assessment/Plan: s/p Procedure(s): ESOPHAGOGASTRODUODENOSCOPY (EGD) Intermittent nausea and vomiting post Roux-en-Y gastric bypass. She has had a  thorough workup. There is no evidence of healing at the anastomosis but no stricture or ulcer or other complication identified on CT scan. I think her symptoms are multifactorial in the close observation is the best course at this point. I discussed this with her and she is comfortable with this plan. We will continue protonix for now. I will plan to see her in the office in 2 weeks   LOS: 7 days    [name] T 07/28/2011

## 2011-07-28 NOTE — Telephone Encounter (Signed)
[facility] called needing a hard RX for the Roxicet 5/325 mg/9ml solution.  The ordered formula was not available. Therefore it was changed to Percocet 5/325 - 1 tab by mouth every 4 hours for pain as needed #48.  Please mail RX to [facility]- [address] [place] [address].  Rx to Dr. Jamse Mead for signature with addressed envelope.

## 2011-07-28 NOTE — Discharge Instructions (Signed)
Miralax as  Needed for constipation Benefiber daily

## 2011-08-14 ENCOUNTER — Encounter (INDEPENDENT_AMBULATORY_CARE_PROVIDER_SITE_OTHER): Payer: Self-pay | Admitting: General Surgery

## 2011-08-14 ENCOUNTER — Ambulatory Visit (INDEPENDENT_AMBULATORY_CARE_PROVIDER_SITE_OTHER): Payer: Medicare HMO | Admitting: General Surgery

## 2011-08-14 NOTE — Progress Notes (Signed)
Patient returns now 6 weeks on microscopic Roux-en-Y gastric bypass. She had to be rehospitalized for persistent nausea vomiting and some epigastric pain and on endoscopy did have evidence of a small marginal ulcer. She has been on Protonix. She actually reports she is doing much better. Her vomiting has essentially resolved. She is tolerating protein shakes without difficulty and beginning to add solid proteins such as cheese and small amounts of meat. No abdominal pain. Energy level is good.  Exam: BP 134/90  Pulse 72  Temp(Src) 97.8 F (36.6 C) (Temporal)  Resp 18  Ht 5\' 5"  (1.651 m)  Wt 204 lb 9.6 oz (92.806 kg)  BMI 34.05 kg/m2  LMP 07/27/2011  General: Appears well. Abdomen: Soft and nontender. Wounds healing well.  Assessment and plan: Doing well 6 weeks following laparoscopic Roux-en-Y gastric bypass. Weight loss is 34 pounds. She will return in 6 weeks.

## 2011-08-24 ENCOUNTER — Encounter: Payer: Medicare HMO | Attending: Family Medicine | Admitting: *Deleted

## 2011-08-24 ENCOUNTER — Encounter: Payer: Self-pay | Admitting: *Deleted

## 2011-08-24 DIAGNOSIS — E119 Type 2 diabetes mellitus without complications: Secondary | ICD-10-CM | POA: Insufficient documentation

## 2011-08-24 DIAGNOSIS — Z713 Dietary counseling and surveillance: Secondary | ICD-10-CM | POA: Insufficient documentation

## 2011-08-24 NOTE — Patient Instructions (Signed)
Goals:  Follow Phase 3B: High Protein + Non-Starchy Vegetables  Eat 3-6 small meals/snacks, every 3-5 hrs  Increase lean protein foods to meet 60-80g goal  Increase fluid intake to 64oz +  Avoid drinking 15 minutes before, during and 30 minutes after eating  Aim for >30 min of physical activity daily 

## 2011-08-24 NOTE — Progress Notes (Signed)
  Follow-up visit: 8 Weeks Post-Operative Gastric Bypass Surgery  Medical Nutrition Therapy:  Appt start time: 1010 end time:  1100.  Assessment:  Primary concerns today: post-operative bariatric surgery nutrition management. [name] reports that after an episode of being in the hospital she is doing much better. She has incorporated many of her vegetables and continues to struggle to reach her protein goals.  Surgery date: 06/30/11  Surgery type: Gastric Bypass   Weight today: 197.3 lbs Weight change: 18.7 lbs Total weight lost: 26.5 lbs BMI: 32.4 Weight goal: 140 lbs [facility] weight at [facility]: 223.8 lbs  24-hr recall:  B ( AM): boiled egg (1) Snk (AM): Atkin's protein shake   L (PM): Shaved turkey/chicken deli meat (3 oz) Snk (PM): 10 wheat thins w/ 2 oz cheese OR 1 Tbsp peanut butter D (PM): 3/4 cup Special K High Protein Snk (PM): n/a  Fluid intake: Protein shake, water = 40-50 oz Estimated total protein intake: 40-50g  Medications: Pt only on 20 units Lantus, Off [place] Supplementation: Taking regularly; Started Fiber supplement per Dr. Johna Sheriff  CBG monitoring: Daily Average CBG per patient: FBS = 115-160; 2 hrs = 150 - 225  Last patient reported A1c: 8.0% (reported per pt; last month)  Lab Results  Component Value Date   HGBA1C 12.3* 03/20/2011   Using straws: No Drinking while eating: No Hair loss: No Carbonated beverages: No N/V/D/C: None reported Dumping syndrome: No  Recent physical activity:  Limited at this time due low energy levels  Progress Towards Goal(s):  In progress.  Handouts given during visit include:  Phase 3B - High Protein + Non-starchy vegetables  Pre-Op Diet   Nutritional Diagnosis:  [place]-3.3 Overweight/obesity As related to recent Gastric Bypass Surgery.  As evidenced by pt following Gastric Bypass dietary guidelines for continued weight loss.    Inadequate protein intake related to small portions of protein rich foods s/p bariatric  surgery as evidenced by pt meeting ~75% of estimated protein needs.  Intervention:  Nutrition education; Limit high carb/starchy foods.  Monitoring/Evaluation:  Dietary intake, exercise, and body weight. Follow up in 1-2 months for 3-4 month post-op visit.

## 2011-08-25 ENCOUNTER — Ambulatory Visit: Payer: Medicare HMO | Admitting: *Deleted

## 2011-09-10 ENCOUNTER — Other Ambulatory Visit: Payer: Self-pay

## 2011-09-10 ENCOUNTER — Encounter (HOSPITAL_COMMUNITY): Payer: Self-pay | Admitting: Family Medicine

## 2011-09-10 ENCOUNTER — Emergency Department (HOSPITAL_COMMUNITY)
Admission: EM | Admit: 2011-09-10 | Discharge: 2011-09-10 | Disposition: A | Discharge status: Home / Self Care | Payer: Medicare HMO | Attending: Emergency Medicine | Admitting: Emergency Medicine

## 2011-09-10 DIAGNOSIS — G8929 Other chronic pain: Secondary | ICD-10-CM | POA: Insufficient documentation

## 2011-09-10 DIAGNOSIS — I10 Essential (primary) hypertension: Secondary | ICD-10-CM | POA: Insufficient documentation

## 2011-09-10 DIAGNOSIS — R6883 Chills (without fever): Secondary | ICD-10-CM | POA: Insufficient documentation

## 2011-09-10 DIAGNOSIS — E669 Obesity, unspecified: Secondary | ICD-10-CM | POA: Insufficient documentation

## 2011-09-10 DIAGNOSIS — R51 Headache: Secondary | ICD-10-CM | POA: Insufficient documentation

## 2011-09-10 DIAGNOSIS — J45909 Unspecified asthma, uncomplicated: Secondary | ICD-10-CM | POA: Insufficient documentation

## 2011-09-10 DIAGNOSIS — R197 Diarrhea, unspecified: Secondary | ICD-10-CM | POA: Insufficient documentation

## 2011-09-10 DIAGNOSIS — Z794 Long term (current) use of insulin: Secondary | ICD-10-CM | POA: Insufficient documentation

## 2011-09-10 DIAGNOSIS — M25519 Pain in unspecified shoulder: Secondary | ICD-10-CM | POA: Insufficient documentation

## 2011-09-10 DIAGNOSIS — M549 Dorsalgia, unspecified: Secondary | ICD-10-CM | POA: Insufficient documentation

## 2011-09-10 DIAGNOSIS — Z9884 Bariatric surgery status: Secondary | ICD-10-CM | POA: Insufficient documentation

## 2011-09-10 DIAGNOSIS — E119 Type 2 diabetes mellitus without complications: Secondary | ICD-10-CM | POA: Insufficient documentation

## 2011-09-10 DIAGNOSIS — R112 Nausea with vomiting, unspecified: Secondary | ICD-10-CM

## 2011-09-10 DIAGNOSIS — R109 Unspecified abdominal pain: Secondary | ICD-10-CM | POA: Insufficient documentation

## 2011-09-10 DIAGNOSIS — I498 Other specified cardiac arrhythmias: Secondary | ICD-10-CM | POA: Insufficient documentation

## 2011-09-10 LAB — PREGNANCY, URINE: Preg Test, Ur: NEGATIVE

## 2011-09-10 LAB — URINALYSIS, ROUTINE W REFLEX MICROSCOPIC
Bilirubin Urine: NEGATIVE
Hgb urine dipstick: NEGATIVE
Nitrite: NEGATIVE
Specific Gravity, Urine: 1.035 — ABNORMAL HIGH (ref 1.005–1.030)
pH: 5.5 (ref 5.0–8.0)

## 2011-09-10 LAB — CBC
Platelets: 330 10*3/uL (ref 150–400)
RBC: 4.15 MIL/uL (ref 3.87–5.11)
RDW: 14.2 % (ref 11.5–15.5)
WBC: 9.5 10*3/uL (ref 4.0–10.5)

## 2011-09-10 LAB — DIFFERENTIAL
Basophils Absolute: 0 10*3/uL (ref 0.0–0.1)
Eosinophils Absolute: 0.2 10*3/uL (ref 0.0–0.7)
Lymphocytes Relative: 10 % — ABNORMAL LOW (ref 12–46)
Lymphs Abs: 0.9 10*3/uL (ref 0.7–4.0)
Neutrophils Relative %: 86 % — ABNORMAL HIGH (ref 43–77)

## 2011-09-10 LAB — URINE MICROSCOPIC-ADD ON

## 2011-09-10 LAB — COMPREHENSIVE METABOLIC PANEL
ALT: 67 U/L — ABNORMAL HIGH (ref 0–35)
AST: 95 U/L — ABNORMAL HIGH (ref 0–37)
Alkaline Phosphatase: 183 U/L — ABNORMAL HIGH (ref 39–117)
CO2: 26 mEq/L (ref 19–32)
GFR calc Af Amer: >90 mL/min (ref >90)
Glucose, Bld: 215 mg/dL — ABNORMAL HIGH (ref 70–99)
Potassium: 4.3 mEq/L (ref 3.5–5.1)
Sodium: 138 mEq/L (ref 135–145)
Total Protein: 6.8 g/dL (ref 6.0–8.3)

## 2011-09-10 MED ORDER — PROMETHAZINE HCL 25 MG/ML IJ SOLN
12.5000 mg | Freq: Once | INTRAMUSCULAR | Status: AC
  Administered 2011-09-10: 12.5 mg via INTRAVENOUS
  Filled 2011-09-10 (×2): qty 1

## 2011-09-10 MED ORDER — HYDROCODONE-ACETAMINOPHEN 5-325 MG PO TABS: ORAL_TABLET | ORAL | Status: AC

## 2011-09-10 MED ORDER — PROMETHAZINE HCL 25 MG PO TABS: 25.0000 mg | ORAL_TABLET | Freq: Four times a day (QID) | ORAL | Status: DC | PRN

## 2011-09-10 MED ORDER — SODIUM CHLORIDE 0.9 % IV SOLN
INTRAVENOUS | Status: DC
  Administered 2011-09-10: 16:00:00 via INTRAVENOUS

## 2011-09-10 MED ORDER — ACETAMINOPHEN 325 MG PO TABS
650.0000 mg | ORAL_TABLET | Freq: Once | ORAL | Status: AC
  Administered 2011-09-10: 650 mg via ORAL
  Filled 2011-09-10: qty 2

## 2011-09-10 MED ORDER — SODIUM CHLORIDE 0.9 % IV BOLUS (SEPSIS)
1000.0000 mL | Freq: Once | INTRAVENOUS | Status: AC
  Administered 2011-09-10: 1000 mL via INTRAVENOUS

## 2011-09-10 MED ORDER — HYDROMORPHONE HCL PF 1 MG/ML IJ SOLN
0.5000 mg | Freq: Once | INTRAMUSCULAR | Status: AC
  Administered 2011-09-10: 0.5 mg via INTRAVENOUS
  Filled 2011-09-10: qty 1

## 2011-09-10 MED ORDER — SODIUM CHLORIDE 0.9 % IV BOLUS (SEPSIS): 1000.0000 mL | Freq: Once | INTRAVENOUS | Status: DC

## 2011-09-10 MED ORDER — GI COCKTAIL ~~LOC~~
30.0000 mL | Freq: Once | ORAL | Status: AC
  Administered 2011-09-10: 30 mL via ORAL
  Filled 2011-09-10: qty 30

## 2011-09-10 NOTE — ED Notes (Signed)
Pt c/o sharp left arm/shoulder pain and a feeling of acid reflux. Pt placed on cardiac monitor and EKG obtained. Will notify MD.

## 2011-09-10 NOTE — Discharge Instructions (Signed)
Please read and follow all provided instructions.  Your diagnoses today include:  1. Nausea vomiting and diarrhea    Tests performed today include:  Blood counts and electrolytes  Blood tests to check liver and kidney function  Urine test to look for infection and pregnancy (in women)  Vital signs. See below for your results today.   Medications prescribed:   Vicodin (hydrocodone/acetaminophen) - narcotic pain medication  You have been prescribed narcotic pain medication such as Vicodin or Percocet: DO NOT drive or perform any activities that require you to be awake and alert because this medicine can make you drowsy. BE VERY CAREFUL not to take multiple medicines containing Tylenol (also called acetaminophen). Doing so can lead to an overdose which can damage your liver and cause liver failure and possibly death.    Phenergan (promethazine) - for nausea and vomiting  Take any prescribed medications only as directed.  Home care instructions:   Follow any educational materials contained in this packet.   Your abdominal pain, nausea, vomiting, and diarrhea may be caused by a viral gastroenteritis also called 'stomach flu'. You should rest for the next several days. Keep drinking plenty of fluids and use the medicine for nausea as directed.    Drink clear liquids for the next 24 hours and introduce solid foods slowly after 24 hours using the b.r.a.t. diet (see attached information sheet).    Follow-up instructions: Please follow-up with your primary care provider in the next 2 days for further evaluation of your symptoms. If you are not feeling better in 48 hours you may have a condition that is more serious and you need re-evaluation. If you do not have a primary care doctor -- see below for referral information.   Return instructions:  SEEK IMMEDIATE MEDICAL ATTENTION IF:  If you have pain that does not go away or becomes severe   A temperature above 101F develops    Repeated vomiting occurs (multiple episodes)   If you have pain that becomes localized to portions of the abdomen. The right side could possibly be appendicitis. In an adult, the left lower portion of the abdomen could be colitis or diverticulitis.   Blood is being passed in stools or vomit (bright red or black tarry stools)   You develop chest pain, difficulty breathing, dizziness or fainting, or become confused, poorly responsive, or inconsolable (young children)  If you have any other emergent concerns regarding your health  Additional Information: Abdominal (belly) pain can be caused by many things. Your caregiver performed an examination and possibly ordered blood/urine tests and imaging (CT scan, x-rays, ultrasound). Many cases can be observed and treated at home after initial evaluation in the emergency department. Even though you are being discharged home, abdominal pain can be unpredictable. Therefore, you need a repeated exam if your pain does not resolve, returns, or worsens. Most patients with abdominal pain don't have to be admitted to the hospital or have surgery, but serious problems like appendicitis and gallbladder attacks can start out as nonspecific pain. Many abdominal conditions cannot be diagnosed in one visit, so follow-up evaluations are very important.  Your vital signs today were: BP 155/96  Pulse 103  Temp(Src) 99.1 F (37.3 C) (Oral)  Resp 18  SpO2 100%  LMP 08/24/2011 If your blood pressure (bp) was elevated above 135/85 this visit, please have this repeated by your doctor within one month. -------------- No Primary Care Doctor Call [facility]  [phone] Other agencies that provide inexpensive medical care  [facility]  [phone]    [facility]  [phone]    [facility]  [phone]    [facility]  [phone]    [facility]  [phone]    [facility]  [phone] -------------- RESOURCE  GUIDE:  Dental Problems  Patients with Medicaid: [facility] [phone] [address]                                            [phone] [address] Phone:  [phone]                                                   Phone:  [phone]  If unable to pay or uninsured, contact:  [facility] or [facility]. to become qualified for the adult dental clinic.  Chronic Pain Problems Contact [facility]  [phone] Patients need to be referred by their primary care doctor.  Insufficient Money for Medicine Contact [employer]:  call "211" or [facility] [phone].  Psychological Services [facility]  [phone] [facility]  [phone] [facility]   [phone] (emergency services [phone])  Substance Abuse Resources [facility]  [phone] [facility] [phone] [facility] [place] [phone] Floydene Flock [phone] Residential & Outpatient Substance Abuse Program  [phone]  Abuse/Neglect [facility] Child Abuse Hotline [phone] [facility] Child Abuse Hotline [phone] (After Hours)  Emergency Shelter [facility] [phone]  Maternity Homes [facility] at [facility] [place] [phone] [facility] [phone]  [facility] Resources  [facility]     [employer]                          [facility] Dept. [address] [place]                       [address]      [address] [place] [address]  Blondell Reveal Phone:  [phone]                                   Phone:  [phone]                 Phone:  [phone]  [facility] Mental [facility] Phone:  [phone]  [facility] Child Abuse Hotline [phone] [phone] (After  Hours)

## 2011-09-10 NOTE — ED Notes (Signed)
Pt given 4mg  zofran pta by EMS

## 2011-09-10 NOTE — ED Notes (Signed)
Per EMS: Pt had gastric bypass surgery in January 2013. Reports N/V/D x2 days.

## 2011-09-10 NOTE — ED Notes (Signed)
WUJ:[id number]<JY> Expected date:09/10/11<BR> Expected time:11:54 AM<BR> Means of arrival:Ambulance<BR> Comments:<BR> [facility]- hyperglycemic

## 2011-09-10 NOTE — ED Provider Notes (Signed)
History     CSN: [id number]  Arrival date & time 09/10/11  1209   First MD Initiated Contact with Patient 09/10/11 1210      Chief Complaint  Patient presents with  . Nausea  . Emesis  . Diarrhea    (Consider location/radiation/quality/duration/timing/severity/associated sxs/prior treatment) HPI Comments: Patient presents with complaint of nausea, vomiting, watery, non-bloody diarrhea that started yesterday. Patient had a gastric bypass performed 06/30/2011 by Dr. Johna Sheriff. She was admitted in February for persistent nausea and vomiting, had an EGD performed which showed possible ulcer. Patient was stabilized and discharged on Protonix. Patient notes onset of nausea, vomiting, and diarrhea yesterday. She has been in to contact with friends that have had similar symptoms. Patient has taken Phenergan at home for her symptoms. She denies fever but states she has had shaking chills. Also reports headache. Denies urinary symptoms.  Patient is a 41 y.o. female presenting with vomiting and diarrhea. The history is provided by the patient.  Emesis  This is a new problem. The current episode started yesterday. The problem has not changed since onset.The emesis has an appearance of stomach contents. There has been no fever. Associated symptoms include chills, diarrhea and headaches. Pertinent negatives include no abdominal pain, no cough, no fever and no myalgias. Risk factors include ill contacts.  Diarrhea The primary symptoms include vomiting and diarrhea. Primary symptoms do not include fever, abdominal pain, nausea, dysuria, myalgias or rash.  The illness is also significant for chills. The illness does not include constipation.    Past Medical History  Diagnosis Date  . Arthritis   . Asthma   . GERD (gastroesophageal reflux disease)   . Diabetes mellitus   . Hyperlipidemia   . Migraines   . Hypertension   . Back pain, chronic   . Depression   . Neuropathy     Past Surgical  History  Procedure Date  . Back surgery November 1992  . Cesarean section December 1998  . Tubal ligation January 1999  . Cholecystectomy 1998  . Gastric roux-en-y 06/30/2011    Procedure: LAPAROSCOPIC ROUX-EN-Y GASTRIC;  Surgeon: Mariella Saa, MD;  Location: [facility];  Service: General;  Laterality: N/A;     . Esophagogastroduodenoscopy 07/27/2011    Procedure: ESOPHAGOGASTRODUODENOSCOPY (EGD);  Surgeon: Freddy Jaksch, MD;  Location: [facility];  Service: Endoscopy;  Laterality: N/A;    Family History  Problem Relation Age of Onset  . Diabetes Maternal Aunt   . Diabetes Paternal Aunt   . Diabetes Paternal Grandfather     History  Substance Use Topics  . Smoking status: Never Smoker   . Smokeless tobacco: Never Used  . Alcohol Use: No    OB History    Grav Para Term Preterm Abortions TAB SAB Ect Mult Living                  Review of Systems  Constitutional: Positive for chills. Negative for fever.  HENT: Negative for sore throat and rhinorrhea.   Eyes: Negative for redness.  Respiratory: Negative for cough.   Cardiovascular: Negative for chest pain.  Gastrointestinal: Positive for vomiting and diarrhea. Negative for nausea, abdominal pain, constipation and blood in stool.  Genitourinary: Negative for dysuria.  Musculoskeletal: Negative for myalgias.  Skin: Negative for rash.  Neurological: Positive for headaches.    Allergies  Review of patient's allergies indicates no known allergies.  Home Medications   Current Outpatient Rx  Name Route Sig Dispense Refill  . ALBUTEROL SULFATE  HFA 108 (90 BASE) MCG/ACT IN AERS Inhalation Inhale 2 puffs into the lungs every 6 (six) hours as needed. WHEEZING     . BD PEN NEEDLE ULTRAFINE 29G X 12.7MM MISC      . CLEVER CHEK TEST VI STRP      . CYCLOBENZAPRINE HCL 10 MG PO TABS Oral Take 10 mg by mouth 3 (three) times daily as needed. MUSCLE SPASM     . DULOXETINE HCL 60 MG PO CPEP Oral Take 60 mg by mouth 2 (two)  times daily.     Marland Kitchen HYDROCODONE-ACETAMINOPHEN 5-325 MG PO TABS Oral Take 1 tablet by mouth every 6 (six) hours as needed. PAIN     . INSULIN GLARGINE 100 UNIT/ML [facility] SOLN Subcutaneous Inject 20 Units into the skin at bedtime.     Marland Kitchen LANCETS MISC      . LISINOPRIL-HYDROCHLOROTHIAZIDE 20-12.5 MG PO TABS Oral Take 1 tablet by mouth every morning.     Marland Kitchen MONTELUKAST SODIUM 10 MG PO TABS Oral Take 10 mg by mouth daily before breakfast.     . PANTOPRAZOLE SODIUM 40 MG PO TBEC Oral Take 1 tablet (40 mg total) by mouth daily. 30 tablet 1    BP 144/96  Pulse 101  Temp(Src) 99.1 F (37.3 C) (Oral)  Resp 20  SpO2 96%  LMP 08/24/2011  Physical Exam  Nursing note and vitals reviewed. Constitutional: She is oriented to person, place, and time. She appears well-developed and well-nourished.       Obese  HENT:  Head: Normocephalic and atraumatic.  Eyes: Conjunctivae are normal. Right eye exhibits no discharge. Left eye exhibits no discharge.  Neck: Normal range of motion. Neck supple.  Cardiovascular: Normal rate, regular rhythm and normal heart sounds.   Pulmonary/Chest: Effort normal and breath sounds normal.  Abdominal: Soft. There is no tenderness. There is no rebound and no guarding.       Healing laparoscopy scars over abdomen.   Musculoskeletal: Normal range of motion. She exhibits no edema and no tenderness.  Neurological: She is alert and oriented to person, place, and time.  Skin: Skin is warm and dry.       Three healing lesions on R buttock that patient states are sore and started when she was scratching skin due to itching from morphine given at previous hospitalization. There are no ulcerations, drainage, or surrounding erythema. Appear to be healing well.   Psychiatric: She has a normal mood and affect.    ED Course  Procedures (including critical care time)  Labs Reviewed  CBC - Abnormal; Notable for the following:    Hemoglobin 10.9 (*)    HCT 33.5 (*)    All other components  within normal limits  DIFFERENTIAL - Abnormal; Notable for the following:    Neutrophils Relative 86 (*)    Neutro Abs 8.2 (*)    Lymphocytes Relative 10 (*)    All other components within normal limits  COMPREHENSIVE METABOLIC PANEL - Abnormal; Notable for the following:    Glucose, Bld 215 (*)    Albumin 3.1 (*)    AST 95 (*)    ALT 67 (*)    Alkaline Phosphatase 183 (*)    All other components within normal limits  URINALYSIS, ROUTINE W REFLEX MICROSCOPIC - Abnormal; Notable for the following:    Color, Urine AMBER (*) BIOCHEMICALS MAY BE AFFECTED BY COLOR   APPearance CLOUDY (*)    Specific Gravity, Urine 1.035 (*)    Glucose, UA >1000 (*)  Ketones, ur TRACE (*)    Protein, ur 30 (*)    All other components within normal limits  URINE MICROSCOPIC-ADD ON - Abnormal; Notable for the following:    Bacteria, UA MANY (*)    All other components within normal limits  PREGNANCY, URINE   No results found.   1. Nausea vomiting and diarrhea     12:34 PM Patient seen and examined. Work-up initiated. Medications ordered.   Vital signs reviewed and are as follows: Filed Vitals:   09/10/11 1215  BP: 144/96  Pulse: 101  Temp: 99.1 F (37.3 C)  Resp: 20   2:47 PM Patient having L shoulder and stomach pain. EKG run by nurse. GI cocktail ordered.    Date: 09/10/2011  Rate: 101  Rhythm: sinus tachycardia  QRS Axis: normal  Intervals: normal  ST/T Wave abnormalities: normal  Conduction Disutrbances:none  Narrative Interpretation:   Old EKG Reviewed: changes noted from 07/21/11, rate is faster  Patient was tolerating PO's in room. Tylenol given for headache. Fluids given. Patient appears well. Will d/c to home with pain and nausea medication.   The patient was urged to return to the Emergency Department immediately with worsening of current symptoms, worsening abdominal pain, persistent vomiting, blood noted in stools, fever, or any other concerns. The patient verbalized  understanding.   Patient counseled on use of narcotic pain medications. Counseled not to combine these medications with others containing tylenol. Urged not to drink alcohol, drive, or perform any other activities that requires focus while taking these medications. The patient verbalizes understanding and agrees with the plan.  MDM  Patient with symptoms consistent with viral gastroenteritis, especially with history of close contact with those with same symptoms. DO NOT suspect complication with ulcer or bypass at this time. Vitals are stable, no fever.  No signs of severe dehydration, tolerating PO's.  Lungs are clear.  No focal abdominal pain, no concern for appendicitis, cholecystitis, pancreatitis, ruptured viscus, UTI, kidney stone, or any other abdominal etiology.  Supportive therapy indicated with return if symptoms worsen.  Patient counseled.         Renne Crigler, [place] 09/10/11 1900

## 2011-09-11 NOTE — ED Provider Notes (Signed)
Medical screening examination/treatment/procedure(s) were conducted as a shared visit with non-physician practitioner(s) and myself.  I personally evaluated the patient during the encounter.  History and physical congruent with uncomplicated gastroenteritis. Patient is nontoxic. No acute abdomen  Donnetta Hutching, MD 09/11/11 1058

## 2011-09-23 ENCOUNTER — Other Ambulatory Visit (HOSPITAL_COMMUNITY): Payer: Self-pay | Admitting: General Surgery

## 2011-09-23 NOTE — Telephone Encounter (Signed)
Patient req'd refill on Protonix, please advise

## 2011-09-30 ENCOUNTER — Ambulatory Visit (INDEPENDENT_AMBULATORY_CARE_PROVIDER_SITE_OTHER): Payer: Medicare HMO | Admitting: General Surgery

## 2011-09-30 NOTE — Telephone Encounter (Signed)
Patient has office appointment 09/30/11 w/Dr. Johna Sheriff, will review at that time.

## 2011-10-01 ENCOUNTER — Encounter (HOSPITAL_COMMUNITY): Payer: Self-pay | Admitting: *Deleted

## 2011-10-01 ENCOUNTER — Emergency Department (HOSPITAL_COMMUNITY)
Admission: EM | Admit: 2011-10-01 | Discharge: 2011-10-01 | Disposition: A | Discharge status: Home / Self Care | Payer: Medicare HMO | Attending: Emergency Medicine | Admitting: Emergency Medicine

## 2011-10-01 DIAGNOSIS — R51 Headache: Secondary | ICD-10-CM | POA: Insufficient documentation

## 2011-10-01 DIAGNOSIS — Z79899 Other long term (current) drug therapy: Secondary | ICD-10-CM | POA: Insufficient documentation

## 2011-10-01 DIAGNOSIS — I10 Essential (primary) hypertension: Secondary | ICD-10-CM | POA: Insufficient documentation

## 2011-10-01 DIAGNOSIS — K219 Gastro-esophageal reflux disease without esophagitis: Secondary | ICD-10-CM | POA: Insufficient documentation

## 2011-10-01 DIAGNOSIS — E119 Type 2 diabetes mellitus without complications: Secondary | ICD-10-CM | POA: Insufficient documentation

## 2011-10-01 DIAGNOSIS — G8929 Other chronic pain: Secondary | ICD-10-CM | POA: Insufficient documentation

## 2011-10-01 DIAGNOSIS — J45909 Unspecified asthma, uncomplicated: Secondary | ICD-10-CM | POA: Insufficient documentation

## 2011-10-01 MED ORDER — ONDANSETRON HCL 4 MG/2ML IJ SOLN
4.0000 mg | Freq: Once | INTRAMUSCULAR | Status: AC
  Administered 2011-10-01: 4 mg via INTRAVENOUS

## 2011-10-01 MED ORDER — HYDROMORPHONE HCL PF 1 MG/ML IJ SOLN
1.0000 mg | Freq: Once | INTRAMUSCULAR | Status: AC
  Administered 2011-10-01: 1 mg via INTRAVENOUS
  Filled 2011-10-01: qty 1

## 2011-10-01 MED ORDER — DIPHENHYDRAMINE HCL 50 MG/ML IJ SOLN
25.0000 mg | Freq: Once | INTRAMUSCULAR | Status: AC
  Administered 2011-10-01: 25 mg via INTRAVENOUS
  Filled 2011-10-01: qty 1

## 2011-10-01 MED ORDER — SUMATRIPTAN SUCCINATE 100 MG PO TABS: 100.0000 mg | ORAL_TABLET | ORAL | Status: DC | PRN

## 2011-10-01 MED ORDER — SODIUM CHLORIDE 0.9 % IV BOLUS (SEPSIS)
1000.0000 mL | Freq: Once | INTRAVENOUS | Status: AC
  Administered 2011-10-01: 1000 mL via INTRAVENOUS

## 2011-10-01 MED ORDER — KETOROLAC TROMETHAMINE 30 MG/ML IJ SOLN
30.0000 mg | Freq: Once | INTRAMUSCULAR | Status: AC
  Administered 2011-10-01: 30 mg via INTRAVENOUS
  Filled 2011-10-01: qty 1

## 2011-10-01 MED ORDER — ONDANSETRON HCL 4 MG/2ML IJ SOLN
4.0000 mg | Freq: Once | INTRAMUSCULAR | Status: AC
  Administered 2011-10-01: 4 mg via INTRAVENOUS
  Filled 2011-10-01: qty 2

## 2011-10-01 MED ORDER — ONDANSETRON HCL 4 MG/2ML IJ SOLN
INTRAMUSCULAR | Status: AC
  Administered 2011-10-01: 4 mg via INTRAVENOUS
  Filled 2011-10-01: qty 2

## 2011-10-01 NOTE — Discharge Instructions (Signed)
Headaches, Frequently Asked Questions MIGRAINE HEADACHES Q: What is migraine? What causes it? How can I treat it? A: Generally, migraine headaches begin as a dull ache. Then they develop into a constant, throbbing, and pulsating pain. You may experience pain at the temples. You may experience pain at the front or back of one or both sides of the head. The pain is usually accompanied by a combination of:  Nausea.   Vomiting.   Sensitivity to light and noise.  Some people (about 15%) experience an aura (see below) before an attack. The cause of migraine is believed to be chemical reactions in the brain. Treatment for migraine may include over-the-counter or prescription medications. It may also include self-help techniques. These include relaxation training and biofeedback.  Q: What is an aura? A: About 15% of people with migraine get an "aura". This is a sign of neurological symptoms that occur before a migraine headache. You may see wavy or jagged lines, dots, or flashing lights. You might experience tunnel vision or blind spots in one or both eyes. The aura can include visual or auditory hallucinations (something imagined). It may include disruptions in smell (such as strange odors), taste or touch. Other symptoms include:  Numbness.   A "pins and needles" sensation.   Difficulty in recalling or speaking the correct word.  These neurological events may last as long as 60 minutes. These symptoms will fade as the headache begins. Q: What is a trigger? A: Certain physical or environmental factors can lead to or "trigger" a migraine. These include:  Foods.   Hormonal changes.   Weather.   Stress.  It is important to remember that triggers are different for everyone. To help prevent migraine attacks, you need to figure out which triggers affect you. Keep a headache diary. This is a good way to track triggers. The diary will help you talk to your healthcare professional about your  condition. Q: Does weather affect migraines? A: Bright sunshine, hot, humid conditions, and drastic changes in barometric pressure may lead to, or "trigger," a migraine attack in some people. But studies have shown that weather does not act as a trigger for everyone with migraines. Q: What is the link between migraine and hormones? A: Hormones start and regulate many of your body's functions. Hormones keep your body in balance within a constantly changing environment. The levels of hormones in your body are unbalanced at times. Examples are during menstruation, pregnancy, or menopause. That can lead to a migraine attack. In fact, about three quarters of all women with migraine report that their attacks are related to the menstrual cycle.  Q: Is there an increased risk of stroke for migraine sufferers? A: The likelihood of a migraine attack causing a stroke is very remote. That is not to say that migraine sufferers cannot have a stroke associated with their migraines. In persons under age 40, the most common associated factor for stroke is migraine headache. But over the course of a person's normal life span, the occurrence of migraine headache may actually be associated with a reduced risk of dying from cerebrovascular disease due to stroke.  Q: What are acute medications for migraine? A: Acute medications are used to treat the pain of the headache after it has started. Examples over-the-counter medications, NSAIDs, ergots, and triptans.  Q: What are the triptans? A: Triptans are the newest class of abortive medications. They are specifically targeted to treat migraine. Triptans are vasoconstrictors. They moderate some chemical reactions in the brain.   The triptans work on receptors in your brain. Triptans help to restore the balance of a neurotransmitter called serotonin. Fluctuations in levels of serotonin are thought to be a main cause of migraine.  Q: Are over-the-counter medications for migraine  effective? A: Over-the-counter, or "OTC," medications may be effective in relieving mild to moderate pain and associated symptoms of migraine. But you should see your caregiver before beginning any treatment regimen for migraine.  Q: What are preventive medications for migraine? A: Preventive medications for migraine are sometimes referred to as "prophylactic" treatments. They are used to reduce the frequency, severity, and length of migraine attacks. Examples of preventive medications include antiepileptic medications, antidepressants, beta-blockers, calcium channel blockers, and NSAIDs (nonsteroidal anti-inflammatory drugs). Q: Why are anticonvulsants used to treat migraine? A: During the past few years, there has been an increased interest in antiepileptic drugs for the prevention of migraine. They are sometimes referred to as "anticonvulsants". Both epilepsy and migraine may be caused by similar reactions in the brain.  Q: Why are antidepressants used to treat migraine? A: Antidepressants are typically used to treat people with depression. They may reduce migraine frequency by regulating chemical levels, such as serotonin, in the brain.  Q: What alternative therapies are used to treat migraine? A: The term "alternative therapies" is often used to describe treatments considered outside the scope of conventional Western medicine. Examples of alternative therapy include acupuncture, acupressure, and yoga. Another common alternative treatment is herbal therapy. Some herbs are believed to relieve headache pain. Always discuss alternative therapies with your caregiver before proceeding. Some herbal products contain arsenic and other toxins. TENSION HEADACHES Q: What is a tension-type headache? What causes it? How can I treat it? A: Tension-type headaches occur randomly. They are often the result of temporary stress, anxiety, fatigue, or anger. Symptoms include soreness in your temples, a tightening  band-like sensation around your head (a "vice-like" ache). Symptoms can also include a pulling feeling, pressure sensations, and contracting head and neck muscles. The headache begins in your forehead, temples, or the back of your head and neck. Treatment for tension-type headache may include over-the-counter or prescription medications. Treatment may also include self-help techniques such as relaxation training and biofeedback. CLUSTER HEADACHES Q: What is a cluster headache? What causes it? How can I treat it? A: Cluster headache gets its name because the attacks come in groups. The pain arrives with little, if any, warning. It is usually on one side of the head. A tearing or bloodshot eye and a runny nose on the same side of the headache may also accompany the pain. Cluster headaches are believed to be caused by chemical reactions in the brain. They have been described as the most severe and intense of any headache type. Treatment for cluster headache includes prescription medication and oxygen. SINUS HEADACHES Q: What is a sinus headache? What causes it? How can I treat it? A: When a cavity in the bones of the face and skull (a sinus) becomes inflamed, the inflammation will cause localized pain. This condition is usually the result of an allergic reaction, a tumor, or an infection. If your headache is caused by a sinus blockage, such as an infection, you will probably have a fever. An x-ray will confirm a sinus blockage. Your caregiver's treatment might include antibiotics for the infection, as well as antihistamines or decongestants.  REBOUND HEADACHES Q: What is a rebound headache? What causes it? How can I treat it? A: A pattern of taking acute headache medications too   often can lead to a condition known as "rebound headache." A pattern of taking too much headache medication includes taking it more than 2 days per week or in excessive amounts. That means more than the label or a caregiver advises.  With rebound headaches, your medications not only stop relieving pain, they actually begin to cause headaches. Doctors treat rebound headache by tapering the medication that is being overused. Sometimes your caregiver will gradually substitute a different type of treatment or medication. Stopping may be a challenge. Regularly overusing a medication increases the potential for serious side effects. Consult a caregiver if you regularly use headache medications more than 2 days per week or more than the label advises. ADDITIONAL QUESTIONS AND ANSWERS Q: What is biofeedback? A: Biofeedback is a self-help treatment. Biofeedback uses special equipment to monitor your body's involuntary physical responses. Biofeedback monitors:  Breathing.   Pulse.   Heart rate.   Temperature.   Muscle tension.   Brain activity.  Biofeedback helps you refine and perfect your relaxation exercises. You learn to control the physical responses that are related to stress. Once the technique has been mastered, you do not need the equipment any more. Q: Are headaches hereditary? A: Four out of five (80%) of people that suffer report a family history of migraine. Scientists are not sure if this is genetic or a family predisposition. Despite the uncertainty, a child has a 50% chance of having migraine if one parent suffers. The child has a 75% chance if both parents suffer.  Q: Can children get headaches? A: By the time they reach high school, most young people have experienced some type of headache. Many safe and effective approaches or medications can prevent a headache from occurring or stop it after it has begun.  Q: What type of doctor should I see to diagnose and treat my headache? A: Start with your primary caregiver. Discuss his or her experience and approach to headaches. Discuss methods of classification, diagnosis, and treatment. Your caregiver may decide to recommend you to a headache specialist, depending upon  your symptoms or other physical conditions. Having diabetes, allergies, etc., may require a more comprehensive and inclusive approach to your headache. The National Head[employer] Foundation will provide, upon request, a list of NHF physician members in your state. Document Released: 08/22/2003 Document Revised: 05/21/2011 Document Reviewed: 01/30/2008 [employer] Patient Information 2012 [employer].Headache Headaches are caused by many different problems. Most commonly, headache is caused by muscle tension from an injury, fatigue, or emotional upset. Excessive muscle contractions in the scalp and neck result in a headache that often feels like a tight band around the head. Tension headaches often have areas of tenderness over the scalp and the back of the neck. These headaches may last for hours, days, or longer, and some may contribute to migraines in those who have migraine problems. Migraines usually cause a throbbing headache, which is made worse by activity. Sometimes only one side of the head hurts. Nausea, vomiting, eye pain, and avoidance of food are common with migraines. Visual symptoms such as light sensitivity, blind spots, or flashing lights may also occur. Loud noises may worsen migraine headaches. Many factors may cause migraine headaches:  Emotional stress, lack of sleep, and menstrual periods.   Alcohol and some drugs (such as birth control pills).   Diet factors (fasting, caffeine, food preservatives, chocolate).   Environmental factors (weather changes, bright lights, odors, smoke).  Other causes of headaches include minor injuries to the head. Arthritis in the   neck; problems with the jaw, eyes, ears, or nose are also causes of headaches. Allergies, drugs, alcohol, and exposure to smoke can also cause moderate headaches. Rebound headaches can occur if someone uses pain medications for a long period of time and then stops. Less commonly, blood vessel problems in the neck and brain  (including stroke) can cause various types of headache. Treatment of headaches includes medicines for pain and relaxation. Ice packs or heat applied to the back of the head and neck help some people. Massaging the shoulders, neck and scalp are often very useful. Relaxation techniques and stretching can help prevent these headaches. Avoid alcohol and cigarette smoking as these tend to make headaches worse. Please see your caregiver if your headache is not better in 2 days.  SEEK IMMEDIATE MEDICAL CARE IF:   You develop a high fever, chills, or repeated vomiting.   You faint or have difficulty with vision.   You develop unusual numbness or weakness of your arms or legs.   Relief of pain is inadequate with medication, or you develop severe pain.   You develop confusion, or neck stiffness.   You have a worsening of a headache or do not obtain relief.  Document Released: 06/01/2005 Document Revised: 05/21/2011 Document Reviewed: 11/25/2006 [employer] Patient Information 2012 [employer]. 

## 2011-10-01 NOTE — ED Notes (Signed)
Pt ansy about going because she does  Not want to miss [place] [employer].

## 2011-10-01 NOTE — ED Notes (Signed)
Pt. C/o headache and nausea for two days.  Pt. Alert and oriented.  Denies vomiting and diarrhea.

## 2011-10-01 NOTE — ED Provider Notes (Signed)
History     CSN: [id number]  Arrival date & time 10/01/11  1538   First MD Initiated Contact with Patient 10/01/11 1636      Chief Complaint  Patient presents with  . Headache    pt c/o HA that began "a few days ago" also c/o nausea.     (Consider location/radiation/quality/duration/timing/severity/associated sxs/prior treatment) HPI  Patient presents to the ED with complaints of a headache that began 4 days ago. She has a history of migraines that extends years into her past. He states that she had gastric bypass surgery done in October of 2012 and since then gets dehydrated every once in a while because of being unable to tolerate a large amount of fluids at once. She was also told that she has ulcers. She states that whenever she gets dehydrated that she typically gets a migraine. This migraine is not different than any migraine she has had in the past. She does admit to some generalized weakness but no focal weakness, photophobia, phonophobia or other associated symptoms.  Pt is a diabetic and admits to trying Lortab and Tylenol at home without relief.  Past Medical History  Diagnosis Date  . Arthritis   . Asthma   . GERD (gastroesophageal reflux disease)   . Diabetes mellitus   . Hyperlipidemia   . Migraines   . Hypertension   . Back pain, chronic   . Depression   . Neuropathy     Past Surgical History  Procedure Date  . Back surgery November 1992  . Cesarean section December 1998  . Tubal ligation January 1999  . Cholecystectomy 1998  . Gastric roux-en-y 06/30/2011    Procedure: LAPAROSCOPIC ROUX-EN-Y GASTRIC;  Surgeon: Mariella Saa, MD;  Location: [facility];  Service: General;  Laterality: N/A;     . Esophagogastroduodenoscopy 07/27/2011    Procedure: ESOPHAGOGASTRODUODENOSCOPY (EGD);  Surgeon: Freddy Jaksch, MD;  Location: [facility];  Service: Endoscopy;  Laterality: N/A;    Family History  Problem Relation Age of Onset  . Diabetes Maternal Aunt   .  Diabetes Paternal Aunt   . Diabetes Paternal Grandfather     History  Substance Use Topics  . Smoking status: Never Smoker   . Smokeless tobacco: Never Used  . Alcohol Use: No    OB History    Grav Para Term Preterm Abortions TAB SAB Ect Mult Living                  Review of Systems  All other systems reviewed and are negative.    Allergies  Review of patient's allergies indicates no known allergies.  Home Medications   Current Outpatient Rx  Name Route Sig Dispense Refill  . ALBUTEROL SULFATE HFA 108 (90 BASE) MCG/ACT IN AERS Inhalation Inhale 2 puffs into the lungs every 6 (six) hours as needed. WHEEZING     . ALPRAZOLAM 0.25 MG PO TABS Oral Take 0.25 mg by mouth 2 (two) times daily as needed. For anxiety    . BD PEN NEEDLE ULTRAFINE 29G X 12.7MM MISC      . CALCIUM-VITAMIN D-VITAMIN K [phone] MG-UNT-MCG PO CHEW Oral Chew by mouth daily.    Marland Kitchen CLEVER CHEK TEST VI STRP      . CYCLOBENZAPRINE HCL 10 MG PO TABS Oral Take 10 mg by mouth 3 (three) times daily as needed. MUSCLE SPASM     . DULOXETINE HCL 60 MG PO CPEP Oral Take 60 mg by mouth 2 (two) times daily.     Marland Kitchen  HYDROCODONE-ACETAMINOPHEN 5-325 MG PO TABS Oral Take 1 tablet by mouth every 6 (six) hours as needed. PAIN     . INSULIN GLARGINE 100 UNIT/ML [place] SOLN Subcutaneous Inject 20 Units into the skin at bedtime.     Marland Kitchen LANCETS MISC      . LISINOPRIL-HYDROCHLOROTHIAZIDE 20-12.5 MG PO TABS Oral Take 1 tablet by mouth every morning.     Marland Kitchen MONTELUKAST SODIUM 10 MG PO TABS Oral Take 10 mg by mouth daily before breakfast.     . ADULT MULTIVITAMIN W/MINERALS CH Oral Take 1 tablet by mouth daily.    Marland Kitchen PANTOPRAZOLE SODIUM 40 MG PO TBEC Oral Take 1 tablet (40 mg total) by mouth daily. 30 tablet 1  . PROMETHAZINE HCL 25 MG PO TABS Oral Take 25 mg by mouth every 6 (six) hours as needed. For nausea    . PROMETHAZINE HCL 12.5 MG PO TABS Oral Take 1 tablet (12.5 mg total) by mouth every 6 (six) hours as needed for nausea. 30  tablet 1  . PROMETHAZINE HCL 25 MG PO TABS Oral Take 1 tablet (25 mg total) by mouth every 6 (six) hours as needed for nausea. 10 tablet 0  . SUMATRIPTAN SUCCINATE 100 MG PO TABS Oral Take 1 tablet (100 mg total) by mouth every 2 (two) hours as needed for migraine. 10 tablet 0    BP 140/89  Pulse 109  Temp(Src) 98.5 F (36.9 C) (Oral)  Resp 20  SpO2 98%  LMP 09/23/2011  Physical Exam  Nursing note and vitals reviewed. Constitutional: She is oriented to person, place, and time. She appears well-developed and well-nourished. No distress.  HENT:  Head: Normocephalic and atraumatic.  Eyes: Pupils are equal, round, and reactive to light.  Neck: Normal range of motion. Neck supple.  Cardiovascular: Normal rate and regular rhythm.   Pulmonary/Chest: Effort normal.  Abdominal: Soft.  Neurological: She is oriented to person, place, and time. She has normal strength. No cranial nerve deficit or sensory deficit. She displays a negative Romberg sign.  Skin: Skin is warm and dry.    ED Course  Procedures (including critical care time)  Labs Reviewed  GLUCOSE, CAPILLARY - Abnormal; Notable for the following:    Glucose-Capillary 267 (*)    All other components within normal limits   No results found.   1. Headache       MDM  Pt slightly tachycardic in ED. Pt given 2 L of fluids in ED, benadryl, toradol. The headache only mildly resolved therefore more pain medication was given and headache completely resolved. Pt referred back to her PCP for follow-up this week. Neurology consult given if patients migraines continue. Pt given Rx for Imitrex.   Pt has been advised of the symptoms that warrant their return to the ED. Patient has voiced understanding and has agreed to follow-up with the PCP or specialist.         Dorthula Matas, PA 10/01/11 1926

## 2011-10-03 NOTE — ED Provider Notes (Signed)
Medical screening examination/treatment/procedure(s) were performed by non-physician practitioner and as supervising physician I was immediately available for consultation/collaboration.   Nat Christen, MD 10/03/11 [phone]

## 2011-10-05 ENCOUNTER — Ambulatory Visit: Payer: Medicare HMO | Admitting: *Deleted

## 2011-10-07 ENCOUNTER — Encounter (INDEPENDENT_AMBULATORY_CARE_PROVIDER_SITE_OTHER): Payer: Self-pay | Admitting: General Surgery

## 2012-02-14 DIAGNOSIS — F488 Other specified nonpsychotic mental disorders: Secondary | ICD-10-CM

## 2012-02-14 HISTORY — DX: Other specified nonpsychotic mental disorders: F48.8

## 2012-02-16 ENCOUNTER — Emergency Department (HOSPITAL_COMMUNITY)
Admission: EM | Admit: 2012-02-16 | Discharge: 2012-02-18 | Disposition: A | Discharge status: Other Acute Inpatient Hospital | Payer: Medicare HMO | Source: Home / Self Care | Attending: Emergency Medicine | Admitting: Emergency Medicine

## 2012-02-16 DIAGNOSIS — J45909 Unspecified asthma, uncomplicated: Secondary | ICD-10-CM | POA: Insufficient documentation

## 2012-02-16 DIAGNOSIS — F329 Major depressive disorder, single episode, unspecified: Secondary | ICD-10-CM | POA: Insufficient documentation

## 2012-02-16 DIAGNOSIS — E1149 Type 2 diabetes mellitus with other diabetic neurological complication: Secondary | ICD-10-CM | POA: Insufficient documentation

## 2012-02-16 DIAGNOSIS — R45851 Suicidal ideations: Secondary | ICD-10-CM | POA: Insufficient documentation

## 2012-02-16 DIAGNOSIS — Z79899 Other long term (current) drug therapy: Secondary | ICD-10-CM | POA: Insufficient documentation

## 2012-02-16 DIAGNOSIS — E785 Hyperlipidemia, unspecified: Secondary | ICD-10-CM | POA: Insufficient documentation

## 2012-02-16 DIAGNOSIS — Z794 Long term (current) use of insulin: Secondary | ICD-10-CM | POA: Insufficient documentation

## 2012-02-16 DIAGNOSIS — F3289 Other specified depressive episodes: Secondary | ICD-10-CM | POA: Insufficient documentation

## 2012-02-16 DIAGNOSIS — K219 Gastro-esophageal reflux disease without esophagitis: Secondary | ICD-10-CM | POA: Insufficient documentation

## 2012-02-16 DIAGNOSIS — I10 Essential (primary) hypertension: Secondary | ICD-10-CM | POA: Insufficient documentation

## 2012-02-16 DIAGNOSIS — E1142 Type 2 diabetes mellitus with diabetic polyneuropathy: Secondary | ICD-10-CM | POA: Insufficient documentation

## 2012-02-16 NOTE — ED Notes (Signed)
Pt reports depression and anxiety for the past few weeks - states she has "been going through a lot." pt admits to secondary anorexia and insomnia - admits to SI w/o a plan.

## 2012-02-17 ENCOUNTER — Encounter (HOSPITAL_COMMUNITY): Payer: Self-pay | Admitting: *Deleted

## 2012-02-17 LAB — RAPID URINE DRUG SCREEN, HOSP PERFORMED
Amphetamines: POSITIVE — AB
Benzodiazepines: NOT DETECTED
Cocaine: NOT DETECTED
Opiates: NOT DETECTED
Tetrahydrocannabinol: NOT DETECTED

## 2012-02-17 LAB — CBC
MCH: 26.2 pg (ref 26.0–34.0)
Platelets: 466 10*3/uL — ABNORMAL HIGH (ref 150–400)
RBC: 3.93 MIL/uL (ref 3.87–5.11)
WBC: 7.3 10*3/uL (ref 4.0–10.5)

## 2012-02-17 LAB — COMPREHENSIVE METABOLIC PANEL
ALT: 33 U/L (ref 0–35)
AST: 27 U/L (ref 0–37)
CO2: 23 mEq/L (ref 19–32)
Calcium: 9.1 mg/dL (ref 8.4–10.5)
GFR calc non Af Amer: 81 mL/min — ABNORMAL LOW (ref >90)
Sodium: 133 mEq/L — ABNORMAL LOW (ref 135–145)

## 2012-02-17 LAB — GLUCOSE, CAPILLARY
Glucose-Capillary: 109 mg/dL — ABNORMAL HIGH (ref 70–99)
Glucose-Capillary: 126 mg/dL — ABNORMAL HIGH (ref 70–99)
Glucose-Capillary: 75 mg/dL (ref 70–99)
Glucose-Capillary: 85 mg/dL (ref 70–99)

## 2012-02-17 MED ORDER — CYCLOBENZAPRINE HCL 10 MG PO TABS: 10.0000 mg | ORAL_TABLET | Freq: Three times a day (TID) | ORAL | Status: DC | PRN

## 2012-02-17 MED ORDER — LORAZEPAM 1 MG PO TABS
1.0000 mg | ORAL_TABLET | Freq: Three times a day (TID) | ORAL | Status: DC | PRN
  Administered 2012-02-17: 1 mg via ORAL
  Filled 2012-02-17: qty 1

## 2012-02-17 MED ORDER — HYDROCHLOROTHIAZIDE 12.5 MG PO CAPS
12.5000 mg | ORAL_CAPSULE | Freq: Once | ORAL | Status: DC
  Filled 2012-02-17: qty 1

## 2012-02-17 MED ORDER — LORAZEPAM 1 MG PO TABS
1.0000 mg | ORAL_TABLET | Freq: Once | ORAL | Status: AC
  Administered 2012-02-17: 1 mg via ORAL
  Filled 2012-02-17: qty 1

## 2012-02-17 MED ORDER — INSULIN GLARGINE 100 UNIT/ML ~~LOC~~ SOLN
20.0000 [IU] | Freq: Every day | SUBCUTANEOUS | Status: DC
  Administered 2012-02-17: 20 [IU] via SUBCUTANEOUS
  Filled 2012-02-17: qty 1

## 2012-02-17 MED ORDER — IBUPROFEN 600 MG PO TABS
600.0000 mg | ORAL_TABLET | Freq: Three times a day (TID) | ORAL | Status: DC | PRN
  Administered 2012-02-17: 600 mg via ORAL
  Filled 2012-02-17 (×2): qty 1

## 2012-02-17 MED ORDER — DULOXETINE HCL 60 MG PO CPEP
60.0000 mg | ORAL_CAPSULE | Freq: Every day | ORAL | Status: DC
  Administered 2012-02-17: 60 mg via ORAL
  Filled 2012-02-17 (×2): qty 1

## 2012-02-17 MED ORDER — IBUPROFEN 800 MG PO TABS
800.0000 mg | ORAL_TABLET | Freq: Once | ORAL | Status: AC
  Administered 2012-02-17: 800 mg via ORAL
  Filled 2012-02-17: qty 1

## 2012-02-17 MED ORDER — SUMATRIPTAN SUCCINATE 100 MG PO TABS
100.0000 mg | ORAL_TABLET | ORAL | Status: DC | PRN
  Filled 2012-02-17: qty 1

## 2012-02-17 MED ORDER — POTASSIUM CHLORIDE CRYS ER 20 MEQ PO TBCR
40.0000 meq | EXTENDED_RELEASE_TABLET | Freq: Once | ORAL | Status: AC
  Administered 2012-02-17: 40 meq via ORAL
  Filled 2012-02-17: qty 2

## 2012-02-17 MED ORDER — PROMETHAZINE HCL 25 MG PO TABS
25.0000 mg | ORAL_TABLET | Freq: Once | ORAL | Status: AC
  Administered 2012-02-17: 25 mg via ORAL
  Filled 2012-02-17: qty 1

## 2012-02-17 MED ORDER — PANTOPRAZOLE SODIUM 40 MG PO TBEC
40.0000 mg | DELAYED_RELEASE_TABLET | Freq: Once | ORAL | Status: AC
  Administered 2012-02-17: 40 mg via ORAL
  Filled 2012-02-17: qty 1

## 2012-02-17 MED ORDER — LISINOPRIL 10 MG PO TABS
10.0000 mg | ORAL_TABLET | Freq: Once | ORAL | Status: DC
  Filled 2012-02-17: qty 1

## 2012-02-17 MED ORDER — INSULIN ASPART 100 UNIT/ML ~~LOC~~ SOLN: 0.0000 [IU] | Freq: Three times a day (TID) | SUBCUTANEOUS | Status: DC

## 2012-02-17 MED ORDER — ACETAMINOPHEN 325 MG PO TABS
650.0000 mg | ORAL_TABLET | ORAL | Status: DC | PRN
  Administered 2012-02-17: 650 mg via ORAL
  Filled 2012-02-17: qty 2

## 2012-02-17 MED ORDER — MONTELUKAST SODIUM 10 MG PO TABS
10.0000 mg | ORAL_TABLET | Freq: Every day | ORAL | Status: DC
  Administered 2012-02-17 (×2): 10 mg via ORAL
  Filled 2012-02-17 (×3): qty 1

## 2012-02-17 MED ORDER — ALPRAZOLAM 0.5 MG PO TABS: 0.5000 mg | ORAL_TABLET | Freq: Three times a day (TID) | ORAL | Status: DC | PRN

## 2012-02-17 NOTE — ED Notes (Signed)
Patient complains of migraine Ha and refuses imitrex and tylenol for pain. Patient states that she wants a shot of Demoral that what works for her. Dr. Dierdre Highman informed of patient request. Dr. Dierdre Highman stated that he was not going to give the patient any narcotics and to tell patient to get some rest and drink plenty of water. Patient informed and verbalized understanding.

## 2012-02-17 NOTE — ED Notes (Signed)
Pt's panties removed by pt, provided pad & disposable undergarment, panties placed in personal belongings bag & locked in [place] [id number].

## 2012-02-17 NOTE — ED Notes (Signed)
ACT team in room with pt 

## 2012-02-17 NOTE — ED Notes (Addendum)
Pt states "I needed to get away for a few days, I'm hoping I can do that here, have no one to turn to, you wouldn't believe how many people have turned their back on me, haven't had my Cymbalta in a few days, I felt like I was getting worse so I started taking 2 a day, I have a prescription sitting @ the drug store right now, I haven't slept in days, have a lot of stuff going on at my house"; pt denies HI/SI

## 2012-02-17 NOTE — ED Notes (Signed)
Pt to BR stating "I'm going to throw up, some things I just can't tolerate, I shouldn't have eaten the bread or potato"; pt informed not to purge, pt stated "it's about to come up", knocked on door & informed pt not to purge again, she opened the door and stated "that's the only way I can get to come up".
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
## 2012-02-17 NOTE — ED Notes (Signed)
Pt changed into blue scrubs.  Security called for wanding.
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
## 2012-02-17 NOTE — ED Provider Notes (Signed)
Medical screening examination/treatment/procedure(s) were performed by non-physician practitioner and as supervising physician I was immediately available for consultation/collaboration.   Sunnie Nielsen, MD 02/17/12 [phone]

## 2012-02-17 NOTE — ED Notes (Signed)
Pt now states "I've had gastric bypass and can't eat pasta or breads"; pt provided with [place] sandwich, suggested remove [place] & eat with salad, will order no pastas or breads;  Pt verbalized understanding.

## 2012-02-17 NOTE — ED Provider Notes (Addendum)
Patient comfortable. No pain. She is diabetic so I changed her diet to diabetic diet and added CBG monitoring. Her CBG this AM is 109. Otherwise, she is pending behavorial health placement.   Richardean Canal, MD 02/17/12 [id number]  Richardean Canal, MD 02/17/12 [phone]

## 2012-02-17 NOTE — BHH Counselor (Signed)
Info sent to the following locations today:   [facility] - Pending review  [facility] - Pending reveiw [facility] - Declined per Elita Quick due to pt's insurance [employer] - no bed availability  [facility] - no bed availability  [facility] - No bed availability.

## 2012-02-17 NOTE — ED Notes (Signed)
Pt states "I make myself throw up to feel better, I just feel all nervous, they had me on amitriptylline but it made me sleepy"; informed pt VS were good, offered tylenol for h/a, pt agreeable.

## 2012-02-18 ENCOUNTER — Inpatient Hospital Stay (HOSPITAL_COMMUNITY)
Admission: AD | Admit: 2012-02-18 | Discharge: 2012-02-20 | DRG: 885 | Disposition: A | Discharge status: Home / Self Care | Payer: Medicare HMO | Source: Ambulatory Visit | Attending: Psychiatry | Admitting: Psychiatry

## 2012-02-18 ENCOUNTER — Encounter (HOSPITAL_COMMUNITY): Payer: Self-pay | Admitting: *Deleted

## 2012-02-18 DIAGNOSIS — K219 Gastro-esophageal reflux disease without esophagitis: Secondary | ICD-10-CM | POA: Diagnosis present

## 2012-02-18 DIAGNOSIS — Z794 Long term (current) use of insulin: Secondary | ICD-10-CM

## 2012-02-18 DIAGNOSIS — Z79899 Other long term (current) drug therapy: Secondary | ICD-10-CM

## 2012-02-18 DIAGNOSIS — J45909 Unspecified asthma, uncomplicated: Secondary | ICD-10-CM | POA: Diagnosis present

## 2012-02-18 DIAGNOSIS — Z7982 Long term (current) use of aspirin: Secondary | ICD-10-CM

## 2012-02-18 DIAGNOSIS — F429 Obsessive-compulsive disorder, unspecified: Secondary | ICD-10-CM | POA: Diagnosis present

## 2012-02-18 DIAGNOSIS — E119 Type 2 diabetes mellitus without complications: Secondary | ICD-10-CM | POA: Diagnosis present

## 2012-02-18 DIAGNOSIS — M129 Arthropathy, unspecified: Secondary | ICD-10-CM | POA: Diagnosis present

## 2012-02-18 DIAGNOSIS — G43909 Migraine, unspecified, not intractable, without status migrainosus: Secondary | ICD-10-CM | POA: Diagnosis present

## 2012-02-18 DIAGNOSIS — F605 Obsessive-compulsive personality disorder: Secondary | ICD-10-CM

## 2012-02-18 DIAGNOSIS — F411 Generalized anxiety disorder: Secondary | ICD-10-CM | POA: Diagnosis present

## 2012-02-18 DIAGNOSIS — M549 Dorsalgia, unspecified: Secondary | ICD-10-CM | POA: Diagnosis present

## 2012-02-18 DIAGNOSIS — I10 Essential (primary) hypertension: Secondary | ICD-10-CM | POA: Diagnosis present

## 2012-02-18 DIAGNOSIS — F339 Major depressive disorder, recurrent, unspecified: Secondary | ICD-10-CM

## 2012-02-18 DIAGNOSIS — E785 Hyperlipidemia, unspecified: Secondary | ICD-10-CM | POA: Diagnosis present

## 2012-02-18 DIAGNOSIS — F331 Major depressive disorder, recurrent, moderate: Principal | ICD-10-CM | POA: Diagnosis present

## 2012-02-18 HISTORY — DX: Anxiety disorder, unspecified: F41.9

## 2012-02-18 LAB — COMPREHENSIVE METABOLIC PANEL
ALT: 28 U/L (ref 0–35)
AST: 32 U/L (ref 0–37)
Albumin: 3.4 g/dL — ABNORMAL LOW (ref 3.5–5.2)
Alkaline Phosphatase: 118 U/L — ABNORMAL HIGH (ref 39–117)
Glucose, Bld: 108 mg/dL — ABNORMAL HIGH (ref 70–99)
Potassium: 5.1 mEq/L (ref 3.5–5.1)
Sodium: 139 mEq/L (ref 135–145)
Total Protein: 6.9 g/dL (ref 6.0–8.3)

## 2012-02-18 LAB — URINALYSIS, ROUTINE W REFLEX MICROSCOPIC
Ketones, ur: NEGATIVE mg/dL
Leukocytes, UA: NEGATIVE
Protein, ur: NEGATIVE mg/dL
Urobilinogen, UA: 0.2 mg/dL (ref 0.0–1.0)

## 2012-02-18 LAB — GLUCOSE, CAPILLARY
Glucose-Capillary: 135 mg/dL — ABNORMAL HIGH (ref 70–99)
Glucose-Capillary: 184 mg/dL — ABNORMAL HIGH (ref 70–99)

## 2012-02-18 LAB — URINE MICROSCOPIC-ADD ON

## 2012-02-18 MED ORDER — TRAZODONE HCL 100 MG PO TABS
50.0000 mg | ORAL_TABLET | Freq: Every evening | ORAL | Status: DC | PRN
  Administered 2012-02-18 – 2012-02-19 (×2): 50 mg via ORAL
  Filled 2012-02-18 (×2): qty 1
  Filled 2012-02-18 (×4): qty 0.5
  Filled 2012-02-18 (×2): qty 1

## 2012-02-18 MED ORDER — PANTOPRAZOLE SODIUM 20 MG PO TBEC
20.0000 mg | DELAYED_RELEASE_TABLET | Freq: Two times a day (BID) | ORAL | Status: DC
  Administered 2012-02-18 – 2012-02-20 (×4): 20 mg via ORAL
  Filled 2012-02-18: qty 20
  Filled 2012-02-18 (×3): qty 1
  Filled 2012-02-18: qty 20
  Filled 2012-02-18: qty 1
  Filled 2012-02-18 (×2): qty 20
  Filled 2012-02-18 (×2): qty 1

## 2012-02-18 MED ORDER — INSULIN ASPART 100 UNIT/ML ~~LOC~~ SOLN
0.0000 [IU] | Freq: Every day | SUBCUTANEOUS | Status: DC
  Administered 2012-02-18: 3 [IU] via SUBCUTANEOUS

## 2012-02-18 MED ORDER — MELOXICAM 7.5 MG PO TABS
7.5000 mg | ORAL_TABLET | Freq: Every day | ORAL | Status: DC
  Administered 2012-02-18 – 2012-02-20 (×3): 7.5 mg via ORAL
  Filled 2012-02-18: qty 1
  Filled 2012-02-18 (×2): qty 10
  Filled 2012-02-18 (×3): qty 1

## 2012-02-18 MED ORDER — INSULIN ASPART 100 UNIT/ML ~~LOC~~ SOLN
0.0000 [IU] | Freq: Three times a day (TID) | SUBCUTANEOUS | Status: DC
  Administered 2012-02-18 (×2): 3 [IU] via SUBCUTANEOUS
  Administered 2012-02-19: 2 [IU] via SUBCUTANEOUS

## 2012-02-18 MED ORDER — INSULIN ASPART 100 UNIT/ML ~~LOC~~ SOLN: 0.0000 [IU] | Freq: Three times a day (TID) | SUBCUTANEOUS | Status: DC

## 2012-02-18 MED ORDER — DICLOFENAC SODIUM 1 % TD GEL
2.0000 g | Freq: Four times a day (QID) | TRANSDERMAL | Status: AC
  Administered 2012-02-18 (×2): 2 g via TOPICAL
  Filled 2012-02-18 (×2): qty 100

## 2012-02-18 MED ORDER — DICYCLOMINE HCL 10 MG PO CAPS
10.0000 mg | ORAL_CAPSULE | Freq: Four times a day (QID) | ORAL | Status: DC | PRN
  Administered 2012-02-18 – 2012-02-19 (×2): 10 mg via ORAL
  Filled 2012-02-18 (×2): qty 1

## 2012-02-18 MED ORDER — PREGABALIN 75 MG PO CAPS
75.0000 mg | ORAL_CAPSULE | Freq: Three times a day (TID) | ORAL | Status: DC
  Administered 2012-02-18 – 2012-02-20 (×5): 75 mg via ORAL
  Filled 2012-02-18 (×5): qty 1

## 2012-02-18 MED ORDER — ALUM & MAG HYDROXIDE-SIMETH 200-200-20 MG/5ML PO SUSP: 30.0000 mL | ORAL | Status: DC | PRN

## 2012-02-18 MED ORDER — DULOXETINE HCL 20 MG PO CPEP
20.0000 mg | ORAL_CAPSULE | Freq: Two times a day (BID) | ORAL | Status: DC
  Administered 2012-02-18 – 2012-02-20 (×4): 20 mg via ORAL
  Filled 2012-02-18 (×3): qty 1
  Filled 2012-02-18: qty 20
  Filled 2012-02-18: qty 1
  Filled 2012-02-18: qty 20
  Filled 2012-02-18: qty 1
  Filled 2012-02-18 (×2): qty 20

## 2012-02-18 MED ORDER — KETOROLAC TROMETHAMINE 30 MG/ML IJ SOLN
15.0000 mg | Freq: Once | INTRAMUSCULAR | Status: AC
  Administered 2012-02-18: 15 mg via INTRAVENOUS
  Filled 2012-02-18: qty 1

## 2012-02-18 MED ORDER — INSULIN ASPART 100 UNIT/ML ~~LOC~~ SOLN: 0.0000 [IU] | Freq: Every day | SUBCUTANEOUS | Status: DC

## 2012-02-18 MED ORDER — MAGNESIUM HYDROXIDE 400 MG/5ML PO SUSP
30.0000 mL | Freq: Every day | ORAL | Status: DC | PRN
  Administered 2012-02-19: 30 mL via ORAL

## 2012-02-18 MED ORDER — NICOTINE 21 MG/24HR TD PT24
21.0000 mg | MEDICATED_PATCH | Freq: Every day | TRANSDERMAL | Status: DC
  Filled 2012-02-18: qty 1

## 2012-02-18 MED ORDER — POTASSIUM CHLORIDE CRYS ER 20 MEQ PO TBCR
20.0000 meq | EXTENDED_RELEASE_TABLET | Freq: Two times a day (BID) | ORAL | Status: AC
  Administered 2012-02-18 (×2): 20 meq via ORAL
  Filled 2012-02-18 (×2): qty 1

## 2012-02-18 MED ORDER — LIDOCAINE 5 % EX PTCH
1.0000 | MEDICATED_PATCH | CUTANEOUS | Status: DC
  Administered 2012-02-19 – 2012-02-20 (×2): 1 via TRANSDERMAL
  Filled 2012-02-18: qty 1
  Filled 2012-02-18 (×3): qty 10
  Filled 2012-02-18: qty 1

## 2012-02-18 MED ORDER — AMITRIPTYLINE HCL 25 MG PO TABS
25.0000 mg | ORAL_TABLET | Freq: Every day | ORAL | Status: DC
  Administered 2012-02-18 – 2012-02-19 (×2): 25 mg via ORAL
  Filled 2012-02-18: qty 10
  Filled 2012-02-18 (×2): qty 1
  Filled 2012-02-18: qty 10

## 2012-02-18 MED ORDER — CITALOPRAM HYDROBROMIDE 20 MG PO TABS
20.0000 mg | ORAL_TABLET | Freq: Every day | ORAL | Status: DC
  Administered 2012-02-18 – 2012-02-20 (×3): 20 mg via ORAL
  Filled 2012-02-18 (×2): qty 1
  Filled 2012-02-18: qty 10
  Filled 2012-02-18: qty 1
  Filled 2012-02-18: qty 10

## 2012-02-18 MED ORDER — ACETAMINOPHEN 325 MG PO TABS
650.0000 mg | ORAL_TABLET | Freq: Four times a day (QID) | ORAL | Status: DC | PRN
  Administered 2012-02-19: 650 mg via ORAL

## 2012-02-18 NOTE — H&P (Signed)
Psychiatric Admission Assessment Adult  Patient Identification:  [name]  Date of Evaluation:  02/18/2012  Chief Complaint:  MDD  History of Present Illness: This is a 41 year old African-American female, admitted to [facility] from the [facility] ED with complaints of suicide ideations and increased depression. Patient reports, "I went to the [facility] ED Thursday night. My brother took me because I had asked him to take me. It has been 3 weeks that my depression has been getting worse and worse by the day. I am living at home with my mother. That is my major problem right there. Most of my family members and people from the neighborhood are talking trash of me. There was a shop-lifting incident that occurred not too long a ago in a store in our neigborhood. I was mistakenly identified as the person that shop lifted from this store. What actually happened was that I was standing next to the person that actually shop-lifted the merchandise. But my name was printed in the newspapers, and all the people in the neighborhood read about it. Now I am the talk of the town. I mean bad talks and everything. My mother is now putting it on my face every chance that she gets. My depression has been going on for over 30 years. It started when my father died when I was 33 years old. I am not on any medication for depression and I have not actually seen a psychiatrist for my depression either. I recently had a by-pass surgery this past January. I am still trying to recuperate from the surgery. I was thinking that may be I should not be in the earth any more, but when I remember my 98 year old son, I kind of think, my life is worth saving after all. I have to take care of him. He is my life".  ROS: Negative for fever. However, is complaining of headaches and back pains.  HENT: Negative for congestion and rhinorrhea.  Respiratory: Negative for cough, chest tightness and shortness of breath.    Cardiovascular: Negative for chest pain.  Gastrointestinal: Negative for nausea, vomiting and abdominal pain.  Skin: Negative for rash.  Neurological: Negative for weakness and headaches     Mood Symptoms:  Anhedonia, Mood Swings, Past 2 Weeks, Sadness, SI, Worthlessness,  Depression Symptoms:  depressed mood, suicidal thoughts without plan,  (Hypo) Manic Symptoms:  Irritable Mood,  Anxiety Symptoms:  Excessive Worry,  Psychotic Symptoms:  Hallucinations: None  PTSD Symptoms: Had a traumatic exposure:  Patient denies any traumatic events in her life recent and or remote.  Past Psychiatric History: Diagnosis: Major depressive disorder, recurrent, episode  Hospitalizations: [facility]  Outpatient Care: Dr. Carrolyn Meiers hendrick in [place].  Substance Abuse Care: None reported  Self-Mutilation: Patient denies any self mutilations  Suicidal Attempts: Denies attempt, admits thoughts  Violent Behaviors: None reported   Past Medical History:   Past Medical History  Diagnosis Date  . Arthritis   . Asthma   . GERD (gastroesophageal reflux disease)   . Diabetes mellitus   . Hyperlipidemia   . Migraines   . Hypertension   . Back pain, chronic   . Depression   . Neuropathy   . Anxiety      Allergies:   Allergies  Allergen Reactions  . Imitrex (Sumatriptan) Nausea Only   PTA Medications: Prescriptions prior to admission  Medication Sig Dispense Refill  . albuterol (PROVENTIL HFA;VENTOLIN HFA) 108 (90 BASE) MCG/ACT inhaler Inhale 2  puffs into the lungs every 6 (six) hours as needed. WHEEZING       . ALPRAZolam (XANAX) 0.5 MG tablet Take 0.5 mg by mouth daily as needed.      . busPIRone (BUSPAR) 15 MG tablet Take 15 mg by mouth as needed. Was a sample bottle with no instructions      . Calcium-Vitamin D-Vitamin K (CALCIUM + D) 2347085126-40 MG-UNT-MCG CHEW Chew by mouth daily.      . cyclobenzaprine (FLEXERIL) 10 MG tablet Take 10 mg by mouth 3 (three) times daily as needed. MUSCLE  SPASM       . DULoxetine (CYMBALTA) 60 MG capsule Take 60 mg by mouth 2 (two) times daily.       . insulin glargine (LANTUS) 100 UNIT/ML injection Inject 20 Units into the skin at bedtime.       Marland Kitchen lisinopril-hydrochlorothiazide (PRINZIDE,ZESTORETIC) 20-12.5 MG per tablet Take 1 tablet by mouth every morning.       . lubiprostone (AMITIZA) 24 MCG capsule Take 24 mcg by mouth 2 (two) times daily with a meal.      . montelukast (SINGULAIR) 10 MG tablet Take 10 mg by mouth daily before breakfast.       . Multiple Vitamin (MULITIVITAMIN WITH MINERALS) TABS Take 1 tablet by mouth daily.      Marland Kitchen oxyCODONE-acetaminophen (PERCOCET/ROXICET) 5-325 MG per tablet Take 1 tablet by mouth every 6 (six) hours as needed. For pain      . promethazine (PHENERGAN) 25 MG tablet Take 25 mg by mouth every 6 (six) hours as needed. For nausea      . vitamin B-12 (CYANOCOBALAMIN) 1000 MCG tablet Take 1,000 mcg by mouth daily.      . pantoprazole (PROTONIX) 40 MG tablet Take 40 mg by mouth daily.      . promethazine (PHENERGAN) 12.5 MG tablet Take 1 tablet (12.5 mg total) by mouth every 6 (six) hours as needed for nausea.  30 tablet  1  . promethazine (PHENERGAN) 25 MG tablet Take 1 tablet (25 mg total) by mouth every 6 (six) hours as needed for nausea.  10 tablet  0     Substance Abuse History in the last 12 months: Substance Age of 1st Use Last Use Amount Specific Type  Nicotine "I do not smoke, drink alcohol and or use drugs"     Alcohol      Cannabis      Opiates      Cocaine      Methamphetamines      LSD      Ecstasy      Benzodiazepines      Caffeine      Inhalants      Others:                         Consequences of Substance Abuse: Medical Consequences:  Liver damage Legal Consequences:  Arrests, jail time Family Consequences:  family discord  Social History: Current Place of Residence: [place], [place]  Place of Birth: [place], [place]   Family Members: "My son and my mother"  Marital Status:   Single  Children: 1  Sons: 1  Daughters: 0  Relationships: single  Education:  [employer] Financial [employer] Problems/Performance: none reported  Religious Beliefs/Practices: None reported  History of Abuse (Emotional/Phsycial/Sexual): None reported  Occupational Experiences: English as a second language teacher History:  None.  Legal History: none reported  Hobbies/Interests: None reported  Family History:   Family History  Problem Relation Age of Onset  . Diabetes Maternal Aunt   . Diabetes Paternal Aunt   . Diabetes Paternal Grandfather     Mental Status Examination/Evaluation: Objective:  Appearance: Casual  Eye Contact::  Fair  Speech:  Clear and Coherent  Volume:  Normal  Mood:  "I feel very depressed, rated #9"  Affect:  Flat  Thought Process:  Coherent  Orientation:  Full  Thought Content:  Rumination  Suicidal Thoughts:  No  Homicidal Thoughts:  No  Memory:  Immediate;   Good Recent;   Good Remote;   Good  Judgement:  Good  Insight:  Fair  Psychomotor Activity:  Normal  Concentration:  Good  Recall:  Good  Akathisia:  No  Handed:  Right  AIMS (if indicated):     Assets:  Desire for Improvement  Sleep:  Number of Hours: 1.25     Laboratory/X-Ray: None Psychological Evaluation(s)      Assessment:    AXIS I:  major depressive disorder, recurrent episodes. AXIS II:  Deferred AXIS III:   Past Medical History  Diagnosis Date  . Arthritis   . Asthma   . GERD (gastroesophageal reflux disease)   . Diabetes mellitus   . Hyperlipidemia   . Migraines   . Hypertension   . Back pain, chronic   . Depression   . Neuropathy   . Anxiety    AXIS IV:  economic problems, housing problems and other psychosocial or environmental problems AXIS V:  11-20 some danger of hurting self or others possible OR occasionally fails to maintain minimal personal hygiene OR gross impairment in communication  Treatment Plan/Recommendations: Admit for safety and stabilization. Review  and reinstate ant pertinent home medications for other health issues. Start Citalopram 20 mg daily for depression Obtain CBC with diff, CMP, UDS, TSH, HGBa1c. Obtain diabetic consults later. Group counseling sessions and activities.  Treatment Plan Summary: Daily contact with patient to assess and evaluate symptoms and progress in treatment Medication management  Current Medications:  Current Facility-Administered Medications  Medication Dose Route Frequency Provider Last Rate Last Dose  . acetaminophen (TYLENOL) tablet 650 mg  650 mg Oral Q6H PRN Kerry Hough, PA      . alum & mag hydroxide-simeth (MAALOX/MYLANTA) 200-200-20 MG/5ML suspension 30 mL  30 mL Oral Q4H PRN Kerry Hough, PA      . insulin aspart (novoLOG) injection 0-15 Units  0-15 Units Subcutaneous TID WC Kerry Hough, PA      . insulin aspart (novoLOG) injection 0-5 Units  0-5 Units Subcutaneous QHS Kerry Hough, PA      . ketorolac (TORADOL) 30 MG/ML injection 15 mg  15 mg Intravenous Once Kerry Hough, PA   15 mg at 02/18/12 0404  . magnesium hydroxide (MILK OF MAGNESIA) suspension 30 mL  30 mL Oral Daily PRN Kerry Hough, PA      . potassium chloride SA (K-DUR,KLOR-CON) CR tablet 20 mEq  20 mEq Oral BID Kerry Hough, PA   20 mEq at 02/18/12 0758  . traZODone (DESYREL) tablet 50 mg  50 mg Oral QHS,MR X 1 Kerry Hough, PA      . DISCONTD: insulin aspart (novoLOG) injection 0-15 Units  0-15 Units Subcutaneous TID WC Kerry Hough, PA      . DISCONTD: insulin aspart (novoLOG) injection 0-5 Units  0-5 Units Subcutaneous QHS Kerry Hough, PA      . DISCONTD: nicotine (NICODERM CQ - dosed in mg/24  hours) patch 21 mg  21 mg Transdermal Q0600 Kerry Hough, PA       Facility-Administered Medications Ordered in Other Encounters  Medication Dose Route Frequency Provider Last Rate Last Dose  . DISCONTD: acetaminophen (TYLENOL) tablet 650 mg  650 mg Oral Q4H PRN Remi Haggard, NP   650 mg at 02/17/12  1942  . DISCONTD: ALPRAZolam Prudy Feeler) tablet 0.5 mg  0.5 mg Oral TID PRN Remi Haggard, NP      . DISCONTD: cyclobenzaprine (FLEXERIL) tablet 10 mg  10 mg Oral TID PRN Remi Haggard, NP      . DISCONTD: DULoxetine (CYMBALTA) DR capsule 60 mg  60 mg Oral Daily Remi Haggard, NP   60 mg at 02/17/12 1104  . DISCONTD: hydrochlorothiazide (MICROZIDE) capsule 12.5 mg  12.5 mg Oral Once Remi Haggard, NP      . DISCONTD: ibuprofen (ADVIL,MOTRIN) tablet 600 mg  600 mg Oral Q8H PRN Remi Haggard, NP   600 mg at 02/17/12 1535  . DISCONTD: insulin aspart (novoLOG) injection 0-15 Units  0-15 Units Subcutaneous TID WC Richardean Canal, MD      . DISCONTD: insulin glargine (LANTUS) injection 20 Units  20 Units Subcutaneous QHS Remi Haggard, NP   20 Units at 02/17/12 0521  . DISCONTD: lisinopril (PRINIVIL,ZESTRIL) tablet 10 mg  10 mg Oral Once Remi Haggard, NP      . DISCONTD: LORazepam (ATIVAN) tablet 1 mg  1 mg Oral Q8H PRN Remi Haggard, NP   1 mg at 02/17/12 2248  . DISCONTD: montelukast (SINGULAIR) tablet 10 mg  10 mg Oral QHS Remi Haggard, NP   10 mg at 02/17/12 2244  . DISCONTD: SUMAtriptan (IMITREX) tablet 100 mg  100 mg Oral Q2H PRN Remi Haggard, NP        Observation Level/Precautions:  Q 15 minute checks for safety  Laboratory:  CBC Chemistry Profile HbAIC UDS UA  Psychotherapy:  Group sessions  Medications:  See medication changes  Routine PRN Medications:  Yes  Consultations: Diabetic consults    Discharge Concerns:  Safety  Other:     Armandina Stammer I 9/5/201311:12 AM

## 2012-02-18 NOTE — [facility] Suicide Risk Assessment (Addendum)
Suicide Risk Assessment  Admission Assessment     Nursing information obtained from:  Patient Demographic factors:  Divorced or widowed;Low socioeconomic status;Unemployed Current Mental Status:   (Pt denies at this time) Loss Factors:  Decline in physical health;Financial problems / change in socioeconomic status Historical Factors:  Family history of mental illness or substance abuse Risk Reduction Factors:  Responsible for children under 41 years of age;Living with another person, especially a relative  CLINICAL FACTORS:   Severe Anxiety and/or Agitation Depression:   Anhedonia Insomnia Chronic Pain Previous Psychiatric Diagnoses and Treatments  COGNITIVE FEATURES THAT CONTRIBUTE TO RISK:  Closed-mindedness Thought constriction (tunnel vision)    SUICIDE RISK:   Moderate:  Frequent suicidal ideation with limited intensity, and duration, some specificity in terms of plans, no associated intent, good self-control, limited dysphoria/symptomatology, some risk factors present, and identifiable protective factors, including available and accessible social support.  Reason for hospitalization: .depression and feeling hopeless. Diagnosis:   Axis I: Major Depression, Recurrent severe and Amphetamines in Urine, Compulsive Behavior Disorder (codependence) Axis II: Deferred Axis III:  Past Medical History  Diagnosis Date  . Arthritis   . Asthma   . GERD (gastroesophageal reflux disease)   . Diabetes mellitus   . Hyperlipidemia   . Migraines   . Hypertension   . Back pain, chronic   . Depression   . Neuropathy   . Anxiety    Axis IV: other psychosocial or environmental problems Axis V: 21-30 behavior considerably influenced by delusions or hallucinations OR serious impairment in judgment, communication OR inability to function in almost all areas  ADL's:  Intact  Sleep: Poor  Appetite:  Fair  Suicidal Ideation:  Pt denies any suicidal thoughts recently, but was extremely  depressed upon admission Homicidal Ideation:  Pt denies any thoughts, plans, intent of homicide  AEB (as evidenced by):per pt report  Mental Status Examination/Evaluation: Objective:  Appearance: Casual  Eye Contact::  Good  Speech:  Clear and Coherent  Volume:  Normal  Mood:  Anxious, Depressed, Hopeless, Irritable and Worthless  Affect:  Congruent  Thought Process:  Coherent  Orientation:  Full  Thought Content:  WDL  Suicidal Thoughts:  No  Homicidal Thoughts:  No  Memory:  Immediate;   Fair Recent;   Fair Remote;   Fair  Judgement:  Impaired  Insight:  Lacking  Psychomotor Activity:  Normal  Concentration:  Fair  Recall:  Fair  Akathisia:  No  Handed:  Right  AIMS (if indicated):     Assets:  Communication Skills Desire for Improvement  Sleep:  Number of Hours: 1.25    Vital Signs:Blood pressure 120/71, pulse 106, temperature 98.2 F (36.8 C), temperature source Oral, resp. rate 18, height 5\' 4"  (1.626 m), weight 78.926 kg (174 lb), last menstrual period 02/17/2012. Current Medications: Current Facility-Administered Medications  Medication Dose Route Frequency Provider Last Rate Last Dose  . acetaminophen (TYLENOL) tablet 650 mg  650 mg Oral Q6H PRN Kerry Hough, PA      . alum & mag hydroxide-simeth (MAALOX/MYLANTA) 200-200-20 MG/5ML suspension 30 mL  30 mL Oral Q4H PRN Kerry Hough, PA      . amitriptyline (ELAVIL) tablet 25 mg  25 mg Oral QHS Mike Craze, MD      . citalopram (CELEXA) tablet 20 mg  20 mg Oral Daily Sanjuana Kava, NP   20 mg at 02/18/12 1323  . diclofenac sodium (VOLTAREN) 1 % transdermal gel 2 g  2 g Topical  QID Mike Craze, MD       Followed by  . lidocaine (LIDODERM) 5 % 1 patch  1 patch Transdermal Q24H Mike Craze, MD      . dicyclomine (BENTYL) capsule 10 mg  10 mg Oral QID PRN Mike Craze, MD      . DULoxetine (CYMBALTA) DR capsule 20 mg  20 mg Oral BID Mike Craze, MD      . insulin aspart (novoLOG) injection 0-15  Units  0-15 Units Subcutaneous TID WC Kerry Hough, PA   3 Units at 02/18/12 1200  . insulin aspart (novoLOG) injection 0-5 Units  0-5 Units Subcutaneous QHS Kerry Hough, PA      . ketorolac (TORADOL) 30 MG/ML injection 15 mg  15 mg Intravenous Once Kerry Hough, PA   15 mg at 02/18/12 0404  . magnesium hydroxide (MILK OF MAGNESIA) suspension 30 mL  30 mL Oral Daily PRN Kerry Hough, PA      . meloxicam (MOBIC) tablet 7.5 mg  7.5 mg Oral Daily Mike Craze, MD      . pantoprazole (PROTONIX) EC tablet 20 mg  20 mg Oral BID AC Mike Craze, MD      . potassium chloride SA (K-DUR,KLOR-CON) CR tablet 20 mEq  20 mEq Oral BID Kerry Hough, PA   20 mEq at 02/18/12 0758  . pregabalin (LYRICA) capsule 75 mg  75 mg Oral TID Mike Craze, MD      . traZODone (DESYREL) tablet 50 mg  50 mg Oral QHS,MR X 1 Kerry Hough, [place]      . DISCONTD: insulin aspart (novoLOG) injection 0-15 Units  0-15 Units Subcutaneous TID WC Kerry Hough, PA      . DISCONTD: insulin aspart (novoLOG) injection 0-5 Units  0-5 Units Subcutaneous QHS Kerry Hough, PA      . DISCONTD: nicotine (NICODERM CQ - dosed in mg/24 hours) patch 21 mg  21 mg Transdermal Q0600 Kerry Hough, PA       Facility-Administered Medications Ordered in Other Encounters  Medication Dose Route Frequency Provider Last Rate Last Dose  . DISCONTD: acetaminophen (TYLENOL) tablet 650 mg  650 mg Oral Q4H PRN Remi Haggard, NP   650 mg at 02/17/12 1942  . DISCONTD: ALPRAZolam Prudy Feeler) tablet 0.5 mg  0.5 mg Oral TID PRN Remi Haggard, NP      . DISCONTD: cyclobenzaprine (FLEXERIL) tablet 10 mg  10 mg Oral TID PRN Remi Haggard, NP      . DISCONTD: DULoxetine (CYMBALTA) DR capsule 60 mg  60 mg Oral Daily Remi Haggard, NP   60 mg at 02/17/12 1104  . DISCONTD: hydrochlorothiazide (MICROZIDE) capsule 12.5 mg  12.5 mg Oral Once Remi Haggard, NP      . DISCONTD: ibuprofen (ADVIL,MOTRIN) tablet 600 mg  600 mg Oral Q8H PRN Remi Haggard, NP    600 mg at 02/17/12 1535  . DISCONTD: insulin aspart (novoLOG) injection 0-15 Units  0-15 Units Subcutaneous TID WC Richardean Canal, MD      . DISCONTD: insulin glargine (LANTUS) injection 20 Units  20 Units Subcutaneous QHS Remi Haggard, NP   20 Units at 02/17/12 0521  . DISCONTD: lisinopril (PRINIVIL,ZESTRIL) tablet 10 mg  10 mg Oral Once Remi Haggard, NP      . DISCONTD: LORazepam (ATIVAN) tablet 1 mg  1 mg Oral Q8H PRN Remi Haggard, NP   1 mg at 02/17/12 2248  .  DISCONTD: montelukast (SINGULAIR) tablet 10 mg  10 mg Oral QHS Remi Haggard, NP   10 mg at 02/17/12 2244  . DISCONTD: SUMAtriptan (IMITREX) tablet 100 mg  100 mg Oral Q2H PRN Remi Haggard, NP        Lab Results:  Results for orders placed during the hospital encounter of 02/18/12 (from the past 48 hour(s))  GLUCOSE, CAPILLARY     Status: Abnormal   Collection Time   02/18/12  6:07 AM      Component Value Range Comment   Glucose-Capillary 135 (*) 70 - 99 mg/dL   GLUCOSE, CAPILLARY     Status: Abnormal   Collection Time   02/18/12 11:37 AM      Component Value Range Comment   Glucose-Capillary 158 (*) 70 - 99 mg/dL     Physical Findings: AIMS: Facial and Oral Movements Muscles of Facial Expression: None, normal Lips and Perioral Area: None, normal Jaw: None, normal Tongue: None, normal,Extremity Movements Upper (arms, wrists, hands, fingers): None, normal Lower (legs, knees, ankles, toes): None, normal, Trunk Movements Neck, shoulders, hips: None, normal, Overall Severity Severity of abnormal movements (highest score from questions above): None, normal Incapacitation due to abnormal movements: None, normal, Dental Status Current problems with teeth and/or dentures?: No Does patient usually wear dentures?: No  CIWA:    COWS:     Risk: Risk of harm to self is elevated by her depression, low self esteem, childhood and current emotional abuse, addicted to fixing others  Risk of harm to others is minimal in that she has not  been involved in fights or had any legal charges filed on her.  Treatment Plan Summary: Daily contact with patient to assess and evaluate symptoms and progress in treatment Medication management Mood/anxiety less than 3/10 where the scale is 1 is the best and 10 is the worst No signs/symptoms of withdrawal from abusable substances.  Plan: Admit, Start Celexa, add back Lyrica and Cymbalta, pain management for her back.  Refer to 12 Step program. Discussed the risks, benefits, and probable clinical course with and without treatment.  Pt is agreeable to the current course of treatment. We will continue on q. 15 checks the unit protocol. At this time there is no clinical indication for one-to-one observation as patient contract for safety and presents little risk to harm themself and others.  We will increase collateral information. I encourage patient to participate in group milieu therapy. Pt will be seen in treatment team soon for further treatment and appropriate discharge planning. Please see history and physical note for more detailed information ELOS: 3 to 5 days.   Kentley Blyden 02/18/2012, 4:12 PM

## 2012-02-18 NOTE — Progress Notes (Addendum)
[facility] MD Progress Note  02/18/2012 4:17 PM  Diagnosis:   Axis I: Major Depression, Recurrent severe and Amphetamines in Urine, Compulsive Behavior Disorder (codependence) Axis II: Deferred Axis III:  Past Medical History  Diagnosis Date  . Arthritis   . Asthma   . GERD (gastroesophageal reflux disease)   . Diabetes mellitus   . Hyperlipidemia   . Migraines   . Hypertension   . Back pain, chronic   . Depression   . Neuropathy   . Anxiety    Axis IV: other psychosocial or environmental problems Axis V: 21-30 behavior considerably influenced by delusions or hallucinations OR serious impairment in judgment, communication OR inability to function in almost all areas  ADL's:  Intact  Sleep: Poor  Appetite:  Fair  Suicidal Ideation:  Pt denies any suicidal thoughts recently, but was extremely depressed upon admission Homicidal Ideation:  Pt denies any thoughts, plans, intent of homicide  AEB (as evidenced by):per pt report  Mental Status Examination/Evaluation: Objective:  Appearance: Casual  Eye Contact::  Good  Speech:  Clear and Coherent  Volume:  Normal  Mood:  Anxious, Depressed, Hopeless, Irritable and Worthless  Affect:  Congruent  Thought Process:  Coherent  Orientation:  Full  Thought Content:  WDL  Suicidal Thoughts:  No  Homicidal Thoughts:  No  Memory:  Immediate;   Fair Recent;   Fair Remote;   Fair  Judgement:  Impaired  Insight:  Lacking  Psychomotor Activity:  Normal  Concentration:  Fair  Recall:  Fair  Akathisia:  No  Handed:  Right  AIMS (if indicated):     Assets:  Communication Skills Desire for Improvement  Sleep:  Number of Hours: 1.25    Vital Signs:Blood pressure 120/71, pulse 106, temperature 98.2 F (36.8 C), temperature source Oral, resp. rate 18, height 5\' 4"  (1.626 m), weight 78.926 kg (174 lb), last menstrual period 02/17/2012. Current Medications: Current Facility-Administered Medications  Medication Dose Route Frequency  Provider Last Rate Last Dose  . acetaminophen (TYLENOL) tablet 650 mg  650 mg Oral Q6H PRN Kerry Hough, PA      . alum & mag hydroxide-simeth (MAALOX/MYLANTA) 200-200-20 MG/5ML suspension 30 mL  30 mL Oral Q4H PRN Kerry Hough, PA      . amitriptyline (ELAVIL) tablet 25 mg  25 mg Oral QHS Mike Craze, MD      . citalopram (CELEXA) tablet 20 mg  20 mg Oral Daily Sanjuana Kava, NP   20 mg at 02/18/12 1323  . diclofenac sodium (VOLTAREN) 1 % transdermal gel 2 g  2 g Topical QID Mike Craze, MD       Followed by  . lidocaine (LIDODERM) 5 % 1 patch  1 patch Transdermal Q24H Mike Craze, MD      . dicyclomine (BENTYL) capsule 10 mg  10 mg Oral QID PRN Mike Craze, MD      . DULoxetine (CYMBALTA) DR capsule 20 mg  20 mg Oral BID Mike Craze, MD      . insulin aspart (novoLOG) injection 0-15 Units  0-15 Units Subcutaneous TID WC Kerry Hough, PA   3 Units at 02/18/12 1200  . insulin aspart (novoLOG) injection 0-5 Units  0-5 Units Subcutaneous QHS Kerry Hough, PA      . ketorolac (TORADOL) 30 MG/ML injection 15 mg  15 mg Intravenous Once Kerry Hough, PA   15 mg at 02/18/12 0404  . magnesium hydroxide (MILK OF  MAGNESIA) suspension 30 mL  30 mL Oral Daily PRN Kerry Hough, PA      . meloxicam (MOBIC) tablet 7.5 mg  7.5 mg Oral Daily Mike Craze, MD      . pantoprazole (PROTONIX) EC tablet 20 mg  20 mg Oral BID AC Mike Craze, MD      . potassium chloride SA (K-DUR,KLOR-CON) CR tablet 20 mEq  20 mEq Oral BID Kerry Hough, PA   20 mEq at 02/18/12 0758  . pregabalin (LYRICA) capsule 75 mg  75 mg Oral TID Mike Craze, MD      . traZODone (DESYREL) tablet 50 mg  50 mg Oral QHS,MR X 1 Kerry Hough, [place]      . DISCONTD: insulin aspart (novoLOG) injection 0-15 Units  0-15 Units Subcutaneous TID WC Kerry Hough, PA      . DISCONTD: insulin aspart (novoLOG) injection 0-5 Units  0-5 Units Subcutaneous QHS Kerry Hough, PA      . DISCONTD: nicotine (NICODERM CQ -  dosed in mg/24 hours) patch 21 mg  21 mg Transdermal Q0600 Kerry Hough, PA       Facility-Administered Medications Ordered in Other Encounters  Medication Dose Route Frequency Provider Last Rate Last Dose  . DISCONTD: acetaminophen (TYLENOL) tablet 650 mg  650 mg Oral Q4H PRN Remi Haggard, NP   650 mg at 02/17/12 1942  . DISCONTD: ALPRAZolam Prudy Feeler) tablet 0.5 mg  0.5 mg Oral TID PRN Remi Haggard, NP      . DISCONTD: cyclobenzaprine (FLEXERIL) tablet 10 mg  10 mg Oral TID PRN Remi Haggard, NP      . DISCONTD: DULoxetine (CYMBALTA) DR capsule 60 mg  60 mg Oral Daily Remi Haggard, NP   60 mg at 02/17/12 1104  . DISCONTD: hydrochlorothiazide (MICROZIDE) capsule 12.5 mg  12.5 mg Oral Once Remi Haggard, NP      . DISCONTD: ibuprofen (ADVIL,MOTRIN) tablet 600 mg  600 mg Oral Q8H PRN Remi Haggard, NP   600 mg at 02/17/12 1535  . DISCONTD: insulin aspart (novoLOG) injection 0-15 Units  0-15 Units Subcutaneous TID WC Richardean Canal, MD      . DISCONTD: insulin glargine (LANTUS) injection 20 Units  20 Units Subcutaneous QHS Remi Haggard, NP   20 Units at 02/17/12 0521  . DISCONTD: lisinopril (PRINIVIL,ZESTRIL) tablet 10 mg  10 mg Oral Once Remi Haggard, NP      . DISCONTD: LORazepam (ATIVAN) tablet 1 mg  1 mg Oral Q8H PRN Remi Haggard, NP   1 mg at 02/17/12 2248  . DISCONTD: montelukast (SINGULAIR) tablet 10 mg  10 mg Oral QHS Remi Haggard, NP   10 mg at 02/17/12 2244  . DISCONTD: SUMAtriptan (IMITREX) tablet 100 mg  100 mg Oral Q2H PRN Remi Haggard, NP        Lab Results:  Results for orders placed during the hospital encounter of 02/18/12 (from the past 48 hour(s))  GLUCOSE, CAPILLARY     Status: Abnormal   Collection Time   02/18/12  6:07 AM      Component Value Range Comment   Glucose-Capillary 135 (*) 70 - 99 mg/dL   GLUCOSE, CAPILLARY     Status: Abnormal   Collection Time   02/18/12 11:37 AM      Component Value Range Comment   Glucose-Capillary 158 (*) 70 - 99 mg/dL     Physical  Findings: AIMS: Facial and Oral Movements Muscles of Facial Expression: None,  normal Lips and Perioral Area: None, normal Jaw: None, normal Tongue: None, normal,Extremity Movements Upper (arms, wrists, hands, fingers): None, normal Lower (legs, knees, ankles, toes): None, normal, Trunk Movements Neck, shoulders, hips: None, normal, Overall Severity Severity of abnormal movements (highest score from questions above): None, normal Incapacitation due to abnormal movements: None, normal, Dental Status Current problems with teeth and/or dentures?: No Does patient usually wear dentures?: No  CIWA:    COWS:     Treatment Plan Summary: Daily contact with patient to assess and evaluate symptoms and progress in treatment Medication management Mood/anxiety less than 3/10 where the scale is 1 is the best and 10 is the worst No signs/symptoms of withdrawal from abusable substances.  Plan: Admit, Start Celexa, add back Lyrica and Cymbalta, pain management for her back.  Refer to 12 Step program. Discussed the risks, benefits, and probable clinical course with and without treatment.  Pt is agreeable to the current course of treatment.  Jveon Pound 02/18/2012, 4:17 PM

## 2012-02-18 NOTE — Progress Notes (Signed)
Patient ID: [name], female   DOB: 1971/03/03, 41 y.o.   MRN: [id number]   D: Patient pleasant on approach today. Reports mood has been depressed but doing a little better. Currently denies any SI at this time. Anxious about sleeping tonight but is ordered meds that should help that. A lot of new meds ordered today. Went to [employer] and reports having a good time. A: Staff will monitor on q 15 minute checks and follow treatments and medications as ordered. R: Taking meds as ordered and went to lay down in bed.
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
## 2012-02-18 NOTE — Tx Team (Signed)
Initial Interdisciplinary Treatment Plan  PATIENT STRENGTHS: (choose at least two) Average or above average intelligence Capable of independent living Communication skills General fund of knowledge Motivation for treatment/growth  PATIENT STRESSORS: Financial difficulties Health problems Marital or family conflict   PROBLEM LIST: Problem List/Patient Goals Date to be addressed Date deferred Reason deferred Estimated date of resolution  Depression      Risk for self harm      Financial difficulties      Declining health issues                                     DISCHARGE CRITERIA:  Ability to meet basic life and health needs Adequate post-discharge living arrangements Improved stabilization in mood, thinking, and/or behavior Motivation to continue treatment in a less acute level of care Verbal commitment to aftercare and medication compliance  PRELIMINARY DISCHARGE PLAN: Attend aftercare/continuing care group Outpatient therapy Participate in family therapy Return to previous living arrangement  PATIENT/FAMIILY INVOLVEMENT: This treatment plan has been presented to and reviewed with the patient, [name], and/or family member.  The patient and family have been given the opportunity to ask questions and make suggestions.  Jesus Genera [facility] 02/18/2012, 4:50 AM

## 2012-02-18 NOTE — ED Notes (Signed)
Patient discharge via ambulatory with steady gait. Respirations equal and unlabored. Skin warm and dry. No acute distress noted. 

## 2012-02-18 NOTE — Progress Notes (Signed)
Removed black sneakers from patients locker, shoestrings removed. Strings placed back in locker, shoes given to patient, per MD order at patient's request.

## 2012-02-18 NOTE — Progress Notes (Signed)
Patient attended d/c planning group and treatment team. She communicates that depression and anxiety due to environmental factors led to her being here.  Patient lives with her mother, who she does not feel is a supportive figure.  She does feel supported by her 41 year old son. Patient rates depression at 10, anxiety at 9, hopelessness at 10, and helplessness at 10.  Patient reports no SI or HI. Patient will need referral for outpatient follow up.

## 2012-02-18 NOTE — Progress Notes (Signed)
  D) Patient anxious and repeatedly requesting medications for somatic concerns, patient informed we are not allowed to bring powder on unit. Patient verbalizes understanding of rules re: items that are not allowed on unit per [facility] policy. Patient reluctant to attend groups but attended parts of groups when encouraged by staff. Patient denies SI/HI, denies A/V hallucinations.   A) Patient offered support and encouragement, patient encouraged to discuss feelings/concerns with staff. Patient verbalized understanding. Patient monitored Q15 minutes for safety. Patient met with MD and treatment team to discuss today's goals and plan of care.  R) Patient visible in milieu, interacting with peers and attending meals in dining room.  Patient taking medications as ordered. Will continue to monitor.

## 2012-02-18 NOTE — Progress Notes (Signed)
Psychoeducational Group Note  Date:  02/18/2012 Time: 2000  Group Topic/Focus:  Karaoke group  Participation Level:  Active  Participation Quality:  Appropriate  Affect:  Appropriate  Cognitive:  Appropriate  Insight:  Good  Engagement in Group:  Good  Additional Comments:  Pt. sang during [employer] 02/18/2012, 10:38 PM

## 2012-02-18 NOTE — H&P (Signed)
UDS positive for AMPHTEAMINES. Medical/psychiatric screening examination/treatment/procedure(s) were performed by non-physician practitioner and as supervising physician I was immediately available for consultation/collaboration.  I have seen and examined this patient and agree the major elements of this evaluation.

## 2012-02-18 NOTE — [facility] Counselor (Addendum)
Pt accepted at [facility] by Dr. Dan Humphreys to rm [id number]. EDP notified and agrees with plan.

## 2012-02-19 DIAGNOSIS — F331 Major depressive disorder, recurrent, moderate: Principal | ICD-10-CM

## 2012-02-19 LAB — GLUCOSE, CAPILLARY: Glucose-Capillary: 120 mg/dL — ABNORMAL HIGH (ref 70–99)

## 2012-02-19 LAB — URINE DRUGS OF ABUSE SCREEN W ALC, ROUTINE (REF LAB)
Amphetamine Screen, Ur: NEGATIVE
Cocaine Metabolites: NEGATIVE
Creatinine,U: 174.1 mg/dL
Ethyl Alcohol: <10 mg/dL (ref <10)
Methadone: NEGATIVE
Opiate Screen, Urine: NEGATIVE

## 2012-02-19 LAB — T4, FREE: Free T4: 1.16 ng/dL (ref 0.80–1.80)

## 2012-02-19 MED ORDER — CITALOPRAM HYDROBROMIDE 20 MG PO TABS: 20.0000 mg | ORAL_TABLET | Freq: Every day | ORAL | Status: DC

## 2012-02-19 MED ORDER — DICYCLOMINE HCL 10 MG PO CAPS: 10.0000 mg | ORAL_CAPSULE | Freq: Four times a day (QID) | ORAL | Status: DC | PRN

## 2012-02-19 MED ORDER — AMITRIPTYLINE HCL 25 MG PO TABS: 25.0000 mg | ORAL_TABLET | Freq: Every day | ORAL | Status: DC

## 2012-02-19 MED ORDER — INSULIN GLARGINE 100 UNIT/ML ~~LOC~~ SOLN: 20.0000 [IU] | Freq: Every day | SUBCUTANEOUS | Status: DC

## 2012-02-19 MED ORDER — MELOXICAM 7.5 MG PO TABS: 7.5000 mg | ORAL_TABLET | Freq: Every day | ORAL | Status: DC

## 2012-02-19 MED ORDER — TRAZODONE HCL 50 MG PO TABS: 50.0000 mg | ORAL_TABLET | Freq: Every evening | ORAL | Status: DC | PRN

## 2012-02-19 MED ORDER — PANTOPRAZOLE SODIUM 40 MG PO TBEC: 40.0000 mg | DELAYED_RELEASE_TABLET | Freq: Every day | ORAL | Status: DC

## 2012-02-19 MED ORDER — PREGABALIN 75 MG PO CAPS: 75.0000 mg | ORAL_CAPSULE | Freq: Three times a day (TID) | ORAL | Status: DC

## 2012-02-19 MED ORDER — VITAMIN B-12 1000 MCG PO TABS: 1000.0000 ug | ORAL_TABLET | Freq: Every day | ORAL | Status: DC

## 2012-02-19 MED ORDER — DULOXETINE HCL 20 MG PO CPEP: 20.0000 mg | ORAL_CAPSULE | Freq: Two times a day (BID) | ORAL | Status: DC

## 2012-02-19 MED ORDER — LIDOCAINE 5 % EX PTCH: 1.0000 | MEDICATED_PATCH | CUTANEOUS | Status: AC

## 2012-02-19 NOTE — Progress Notes (Signed)
[facility] MD Progress Note  02/19/2012 4:11 PM  S: "I feel a lot better. Ready to go home to see my son. I am in a very good mood today. I will attend the 12-step program today, and will continue when I get home. I would like to have the prescription for my pain patch to use at home. It works well on my pain".   Diagnosis:   Axis I: Major depressive disorder, recurrent episode, moderate Axis II: Deferred Axis III:  Past Medical History  Diagnosis Date  . Arthritis   . Asthma   . GERD (gastroesophageal reflux disease)   . Diabetes mellitus   . Hyperlipidemia   . Migraines   . Hypertension   . Back pain, chronic   . Depression   . Neuropathy   . Anxiety    Axis IV: other psychosocial or environmental problems Axis V: 41-50 serious symptoms  ADL's:  Intact  Sleep: Good  Appetite:  Good  Suicidal Ideation: "No" Plan:  NO Intent:  No Means:  no Homicidal Ideation: "No  Plan:  No Intent:  no Means:  no  AEB (as evidenced by): per patient's reports  Mental Status Examination/Evaluation: Objective:  Appearance: Casual  Eye Contact::  Good  Speech:  Clear and Coherent  Volume:  Normal  Mood:  Euthymic  Affect:  Appropriate  Thought Process:  Coherent  Orientation:  Full  Thought Content:  WDL  Suicidal Thoughts:  No  Homicidal Thoughts:  No  Memory:  Immediate;   Good Recent;   Good Remote;   Good  Judgement:  Good  Insight:  Good  Psychomotor Activity:  Normal  Concentration:  Good  Recall:  Good  Akathisia:  No  Handed:  Right  AIMS (if indicated):     Assets:  Desire for Improvement  Sleep:  Number of Hours: 6.5    Vital Signs:Blood pressure 155/97, pulse 93, temperature 98.3 F (36.8 C), temperature source Oral, resp. rate 16, height 5\' 4"  (1.626 m), weight 78.926 kg (174 lb), last menstrual period 02/17/2012. Current Medications: Current Facility-Administered Medications  Medication Dose Route Frequency Provider Last Rate Last Dose  . acetaminophen  (TYLENOL) tablet 650 mg  650 mg Oral Q6H PRN Kerry Hough, PA   650 mg at 02/19/12 1610  . alum & mag hydroxide-simeth (MAALOX/MYLANTA) 200-200-20 MG/5ML suspension 30 mL  30 mL Oral Q4H PRN Kerry Hough, PA      . amitriptyline (ELAVIL) tablet 25 mg  25 mg Oral QHS Mike Craze, MD   25 mg at 02/18/12 2132  . citalopram (CELEXA) tablet 20 mg  20 mg Oral Daily Sanjuana Kava, NP   20 mg at 02/19/12 0750  . diclofenac sodium (VOLTAREN) 1 % transdermal gel 2 g  2 g Topical QID Mike Craze, MD   2 g at 02/18/12 2133   Followed by  . lidocaine (LIDODERM) 5 % 1 patch  1 patch Transdermal Q24H Mike Craze, MD   1 patch at 02/19/12 0920  . dicyclomine (BENTYL) capsule 10 mg  10 mg Oral QID PRN Mike Craze, MD   10 mg at 02/19/12 9604  . DULoxetine (CYMBALTA) DR capsule 20 mg  20 mg Oral BID Mike Craze, MD   20 mg at 02/19/12 0751  . insulin aspart (novoLOG) injection 0-15 Units  0-15 Units Subcutaneous TID WC Kerry Hough, PA   3 Units at 02/18/12 1722  . insulin aspart (novoLOG) injection 0-5  Units  0-5 Units Subcutaneous QHS Kerry Hough, PA   3 Units at 02/18/12 2148  . magnesium hydroxide (MILK OF MAGNESIA) suspension 30 mL  30 mL Oral Daily PRN Kerry Hough, PA   30 mL at 02/19/12 1308  . meloxicam (MOBIC) tablet 7.5 mg  7.5 mg Oral Daily Mike Craze, MD   7.5 mg at 02/19/12 0751  . pantoprazole (PROTONIX) EC tablet 20 mg  20 mg Oral BID AC Mike Craze, MD   20 mg at 02/19/12 6283  . potassium chloride SA (K-DUR,KLOR-CON) CR tablet 20 mEq  20 mEq Oral BID Kerry Hough, PA   20 mEq at 02/18/12 1833  . pregabalin (LYRICA) capsule 75 mg  75 mg Oral TID Mike Craze, MD   75 mg at 02/19/12 1155  . traZODone (DESYREL) tablet 50 mg  50 mg Oral QHS,MR X 1 Kerry Hough, PA   50 mg at 02/18/12 2132    Lab Results:  Results for orders placed during the hospital encounter of 02/18/12 (from the past 48 hour(s))  GLUCOSE, CAPILLARY     Status: Abnormal   Collection  Time   02/18/12  6:07 AM      Component Value Range Comment   Glucose-Capillary 135 (*) 70 - 99 mg/dL   GLUCOSE, CAPILLARY     Status: Abnormal   Collection Time   02/18/12 11:37 AM      Component Value Range Comment   Glucose-Capillary 158 (*) 70 - 99 mg/dL   URINALYSIS, ROUTINE W REFLEX MICROSCOPIC     Status: Abnormal   Collection Time   02/18/12  1:38 PM      Component Value Range Comment   Color, Urine YELLOW  YELLOW    APPearance TURBID (*) CLEAR    Specific Gravity, Urine 1.033 (*) 1.005 - 1.030    pH 5.5  5.0 - 8.0    Glucose, UA 250 (*) NEGATIVE mg/dL    Hgb urine dipstick LARGE (*) NEGATIVE    Bilirubin Urine NEGATIVE  NEGATIVE    Ketones, ur NEGATIVE  NEGATIVE mg/dL    Protein, ur NEGATIVE  NEGATIVE mg/dL    Urobilinogen, UA 0.2  0.0 - 1.0 mg/dL    Nitrite NEGATIVE  NEGATIVE    Leukocytes, UA NEGATIVE  NEGATIVE   URINE MICROSCOPIC-ADD ON     Status: Abnormal   Collection Time   02/18/12  1:38 PM      Component Value Range Comment   Squamous Epithelial / LPF FEW (*) RARE    RBC / HPF 3-6  <3 RBC/hpf    Urine-Other AMORPHOUS URATES/PHOSPHATES     GLUCOSE, CAPILLARY     Status: Abnormal   Collection Time   02/18/12  4:59 PM      Component Value Range Comment   Glucose-Capillary 184 (*) 70 - 99 mg/dL    Comment 1 Notify RN      Comment 2 Documented in Chart     COMPREHENSIVE METABOLIC PANEL     Status: Abnormal   Collection Time   02/18/12  7:30 PM      Component Value Range Comment   Sodium 139  135 - 145 mEq/L    Potassium 5.1  3.5 - 5.1 mEq/L    Chloride 107  96 - 112 mEq/L    CO2 25  19 - 32 mEq/L    Glucose, Bld 108 (*) 70 - 99 mg/dL    BUN 23  6 -  23 mg/dL    Creatinine, Ser 1.61  0.50 - 1.10 mg/dL    Calcium 9.0  8.4 - 09.6 mg/dL    Total Protein 6.9  6.0 - 8.3 g/dL    Albumin 3.4 (*) 3.5 - 5.2 g/dL    AST 32  0 - 37 U/L    ALT 28  0 - 35 U/L    Alkaline Phosphatase 118 (*) 39 - 117 U/L    Total Bilirubin 0.1 (*) 0.3 - 1.2 mg/dL    GFR calc non Af Amer 87  (*) >90 mL/min    GFR calc Af Amer >90  >90 mL/min   HEMOGLOBIN A1C     Status: Abnormal   Collection Time   02/18/12  7:35 PM      Component Value Range Comment   Hemoglobin A1C 8.4 (*) <5.7 %    Mean Plasma Glucose 194 (*) <117 mg/dL   TSH     Status: Normal   Collection Time   02/18/12  7:35 PM      Component Value Range Comment   TSH 0.474  0.350 - 4.500 uIU/mL   T4, FREE     Status: Normal   Collection Time   02/18/12  7:35 PM      Component Value Range Comment   Free T4 1.16  0.80 - 1.80 ng/dL   DRUGS OF ABUSE SCREEN W ALC, ROUTINE URINE     Status: Normal   Collection Time   02/18/12  8:19 PM      Component Value Range Comment   Amphetamine Screen, Ur NEGATIVE  Negative    Marijuana Metabolite NEGATIVE  Negative    Barbiturate Quant, Ur NEGATIVE  Negative    Methadone NEGATIVE  Negative    Propoxyphene NEGATIVE  Negative    Benzodiazepines. NEGATIVE  Negative    Phencyclidine (PCP) NEGATIVE  Negative    Cocaine Metabolites NEGATIVE  Negative    Opiate Screen, Urine NEGATIVE  Negative    Ethyl Alcohol <10  <10 mg/dL    Creatinine,U 045.4     GLUCOSE, CAPILLARY     Status: Abnormal   Collection Time   02/18/12  9:43 PM      Component Value Range Comment   Glucose-Capillary 290 (*) 70 - 99 mg/dL    Comment 1 Notify RN      Comment 2 Documented in Chart     GLUCOSE, CAPILLARY     Status: Abnormal   Collection Time   02/19/12  6:20 AM      Component Value Range Comment   Glucose-Capillary 107 (*) 70 - 99 mg/dL     Physical Findings: AIMS: Facial and Oral Movements Muscles of Facial Expression: None, normal Lips and Perioral Area: None, normal Jaw: None, normal Tongue: None, normal,Extremity Movements Upper (arms, wrists, hands, fingers): None, normal Lower (legs, knees, ankles, toes): None, normal, Trunk Movements Neck, shoulders, hips: None, normal, Overall Severity Severity of abnormal movements (highest score from questions above): None, normal Incapacitation due to  abnormal movements: None, normal, Dental Status Current problems with teeth and/or dentures?: No Does patient usually wear dentures?: No  CIWA:    COWS:     Treatment Plan Summary: Daily contact with patient to assess and evaluate symptoms and progress in treatment  Plan: Patient is stable. Scheduled to be discharged in the morning. Patient would like to go home with a prescription on her pain patch.   Armandina Stammer I 02/19/2012, 4:11 PM

## 2012-02-19 NOTE — Progress Notes (Signed)
  D) Patient pleasant and cooperative upon my assessment. Patient appears very bright this morning. Patient states slept " well," and  appetite is "improving." Patient rates depression as   0/10, patient rates hopeless feelings as  5/10. Patient denies SI/HI, denies A/V hallucinations.   A) Patient offered support and encouragement, patient encouraged to discuss feelings/concerns with staff. Patient verbalized understanding. Patient monitored Q15 minutes for safety. Patient met with MD  to discuss today's goals and plan of care.  R) Patient active on unit, attending some groups in day room and all meals in dining room.  Patient has a plan to "try and stay calm, get better and move forward" once she is discharged from [facility]. Patient taking medications as ordered. Will continue to monitor.

## 2012-02-19 NOTE — Progress Notes (Signed)
Patient seen during d/c planning group.  She advised of being better today and rates depression at five, anxiety at six and hopelessness/helplessness at four.  She is currently denying SI/HI.  Patient informed that outpatient follow has been scheduled in the [place] area.

## 2012-02-19 NOTE — Progress Notes (Signed)
[facility] Group Notes: (Counselor/Nursing/MHT/Case Management/Adjunct) 02/19/2012   @1 :15pm - 2:30pm Empowerment in Relapse Prevention  Type of Therapy:  Group Therapy  Participation Level:  Active  Participation Quality:  Appropriate, Sharing, Supportive    Affect:  Appropriate  Cognitive:  Appropriate  Insight:  Limited  Engagement in Group: Good  Engagement in Therapy:  Good  Modes of Intervention:  Support and Exploration  Summary of Progress/Problems: Arynn processed her tendency to allow others to control her by needing to be needed. She stated that she can now see how needing to help others takes her control and gives it to the other person. Wing also explored her need to let go of the past, stating it has been 30 years since her father died and she has not been able to forgive herself, but now knows she needs to. She stated "You can't move forward when you are clinging to the past, and it's time for me to move forward." She discussed her idea of moving forward, which includes getting her own place, becoming less dependent on her family to validate her, and letting go of her son's responsibilities slowly.  Angus Palms, LCSW 02/19/2012  4:22 PM

## 2012-02-19 NOTE — Progress Notes (Signed)
I agree with this note.  

## 2012-02-19 NOTE — Progress Notes (Signed)
[facility] Group Notes:  (Counselor/Nursing/MHT/Case Management/Adjunct)  02/19/2012 10:55 PM  Type of Therapy:  Psychoeducational Skills  Participation Level:  Active  Participation Quality:  Appropriate  Affect:  Appropriate  Cognitive:  Appropriate  Insight:  Good  Engagement in Group:  Good  Engagement in Therapy:  Good  Modes of Intervention:  Education  Summary of Progress/Problems: The patient verbalized that she had a good day since she was more open with her peers. She states that she is feeling better since her nervousness has decreased. Her goal is to get discharged tomorrow and anticipates that this will take place. Her advice for the group this evening is to let go of the past and focus on tomorrow.    [name] 02/19/2012, 10:55 PM

## 2012-02-19 NOTE — Progress Notes (Signed)
Pt participated in therapeutic activity where they watched the pursuit of happiness. They will finish the movie later in the afternoon and have time to process what they watched.   [name] MHT 

## 2012-02-19 NOTE — [facility] Suicide Risk Assessment (Signed)
Suicide Risk Assessment  Discharge Assessment     Current Mental Status by Physician: Patient denies suicidal or homicidal ideation, hallucinations, illusions, or delusions. Patient engages with good eye contact, is able to focus adequately in a one to one setting, and has clear goal directed thoughts. Patient speaks with a natural conversational volume, rate, and tone. Anxiety was reported at 3 on a scale of 1 the least and 10 the most. Depression was reported at 2 on the same scale. Patient is oriented times 4, recent and remote memory intact. Judgement: Limited by her addictive thinking. Insight: Limited by her addictive thinking  Demographic factors:  Divorced or widowed;Low socioeconomic status;Unemployed Loss Factors:  Decline in physical health;Financial problems / change in socioeconomic status Historical Factors:  Family history of mental illness or substance abuse Risk Reduction Factors:  Responsible for children under 73 years of age;Living with another person, especially a relative  Continued Clinical Symptoms:  Severe Anxiety and/or Agitation Depression:   Anhedonia Comorbid alcohol abuse/dependence Alcohol/Substance Abuse/Dependencies Chronic Pain Previous Psychiatric Diagnoses and Treatments  Discharge Diagnoses: Axis I: Major depressive disorder, recurrent episode, moderate  Axis II: Deferred  Axis III:  Past Medical History   Diagnosis  Date   .  Arthritis    .  Asthma    .  GERD (gastroesophageal reflux disease)    .  Diabetes mellitus    .  Hyperlipidemia    .  Migraines    .  Hypertension    .  Back pain, chronic    .  Depression    .  Neuropathy    .  Anxiety     Axis IV: other psychosocial or environmental problems  Axis V: 41-50 serious symptoms   Cognitive Features That Contribute To Risk:  Thought constriction (tunnel vision)    Suicide Risk:  Minimal: No identifiable suicidal ideation.  Patients presenting with no risk factors but with  morbid ruminations; may be classified as minimal risk based on the severity of the depressive symptoms  Labs: Results for orders placed during the hospital encounter of 02/18/12 (from the past 72 hour(s))  GLUCOSE, CAPILLARY     Status: Abnormal   Collection Time   02/18/12  6:07 AM      Component Value Range Comment   Glucose-Capillary 135 (*) 70 - 99 mg/dL   GLUCOSE, CAPILLARY     Status: Abnormal   Collection Time   02/18/12 11:37 AM      Component Value Range Comment   Glucose-Capillary 158 (*) 70 - 99 mg/dL   URINALYSIS, ROUTINE W REFLEX MICROSCOPIC     Status: Abnormal   Collection Time   02/18/12  1:38 PM      Component Value Range Comment   Color, Urine YELLOW  YELLOW    APPearance TURBID (*) CLEAR    Specific Gravity, Urine 1.033 (*) 1.005 - 1.030    pH 5.5  5.0 - 8.0    Glucose, UA 250 (*) NEGATIVE mg/dL    Hgb urine dipstick LARGE (*) NEGATIVE    Bilirubin Urine NEGATIVE  NEGATIVE    Ketones, ur NEGATIVE  NEGATIVE mg/dL    Protein, ur NEGATIVE  NEGATIVE mg/dL    Urobilinogen, UA 0.2  0.0 - 1.0 mg/dL    Nitrite NEGATIVE  NEGATIVE    Leukocytes, UA NEGATIVE  NEGATIVE   URINE MICROSCOPIC-ADD ON     Status: Abnormal   Collection Time   02/18/12  1:38 PM      Component  Value Range Comment   Squamous Epithelial / LPF FEW (*) RARE    RBC / HPF 3-6  <3 RBC/hpf    Urine-Other AMORPHOUS URATES/PHOSPHATES     GLUCOSE, CAPILLARY     Status: Abnormal   Collection Time   02/18/12  4:59 PM      Component Value Range Comment   Glucose-Capillary 184 (*) 70 - 99 mg/dL    Comment 1 Notify RN      Comment 2 Documented in Chart     COMPREHENSIVE METABOLIC PANEL     Status: Abnormal   Collection Time   02/18/12  7:30 PM      Component Value Range Comment   Sodium 139  135 - 145 mEq/L    Potassium 5.1  3.5 - 5.1 mEq/L    Chloride 107  96 - 112 mEq/L    CO2 25  19 - 32 mEq/L    Glucose, Bld 108 (*) 70 - 99 mg/dL    BUN 23  6 - 23 mg/dL    Creatinine, Ser 1.61  0.50 - 1.10 mg/dL     Calcium 9.0  8.4 - 10.5 mg/dL    Total Protein 6.9  6.0 - 8.3 g/dL    Albumin 3.4 (*) 3.5 - 5.2 g/dL    AST 32  0 - 37 U/L    ALT 28  0 - 35 U/L    Alkaline Phosphatase 118 (*) 39 - 117 U/L    Total Bilirubin 0.1 (*) 0.3 - 1.2 mg/dL    GFR calc non Af Amer 87 (*) >90 mL/min    GFR calc Af Amer >90  >90 mL/min   HEMOGLOBIN A1C     Status: Abnormal   Collection Time   02/18/12  7:35 PM      Component Value Range Comment   Hemoglobin A1C 8.4 (*) <5.7 %    Mean Plasma Glucose 194 (*) <117 mg/dL   TSH     Status: Normal   Collection Time   02/18/12  7:35 PM      Component Value Range Comment   TSH 0.474  0.350 - 4.500 uIU/mL   T4, FREE     Status: Normal   Collection Time   02/18/12  7:35 PM      Component Value Range Comment   Free T4 1.16  0.80 - 1.80 ng/dL   DRUGS OF ABUSE SCREEN W ALC, ROUTINE URINE     Status: Normal   Collection Time   02/18/12  8:19 PM      Component Value Range Comment   Amphetamine Screen, Ur NEGATIVE  Negative    Marijuana Metabolite NEGATIVE  Negative    Barbiturate Quant, Ur NEGATIVE  Negative    Methadone NEGATIVE  Negative    Propoxyphene NEGATIVE  Negative    Benzodiazepines. NEGATIVE  Negative    Phencyclidine (PCP) NEGATIVE  Negative    Cocaine Metabolites NEGATIVE  Negative    Opiate Screen, Urine NEGATIVE  Negative    Ethyl Alcohol <10  <10 mg/dL    Creatinine,U 096.0     GLUCOSE, CAPILLARY     Status: Abnormal   Collection Time   02/18/12  9:43 PM      Component Value Range Comment   Glucose-Capillary 290 (*) 70 - 99 mg/dL    Comment 1 Notify RN      Comment 2 Documented in Chart     GLUCOSE, CAPILLARY     Status: Abnormal   Collection  Time   02/19/12  6:20 AM      Component Value Range Comment   Glucose-Capillary 107 (*) 70 - 99 mg/dL   GLUCOSE, CAPILLARY     Status: Abnormal   Collection Time   02/19/12 11:54 AM      Component Value Range Comment   Glucose-Capillary 120 (*) 70 - 99 mg/dL    Comment 1 Notify RN     GLUCOSE, CAPILLARY      Status: Abnormal   Collection Time   02/19/12  5:04 PM      Component Value Range Comment   Glucose-Capillary 143 (*) 70 - 99 mg/dL    Comment 1 Notify RN      Comment 2 Documented in Chart     GLUCOSE, CAPILLARY     Status: Abnormal   Collection Time   02/19/12  8:38 PM      Component Value Range Comment   Glucose-Capillary 175 (*) 70 - 99 mg/dL    Comment 1 Notify RN       RISK REDUCTION FACTORS: What pt has learned from hospital stay is how to be more calm and listen and others have been there too, there is always someone who has it worse than you and that they need to move forward  Risk of self harm is elevated by their depression, chronic pain, anxiety, and both chemical addictions and their addiction to fixing others, but they realize that they have their son and themselves and their future to live for.  Risk of harm to others is minimal in that she has not been involved in fights or had any legal charges filed on her.  Pt seen in treatment team where she divulged the above information. The treatment team concluded that she was ready for discharge and had met her goals for an inpatient setting.  PLAN: Discharge home Continue Medication List  As of 02/19/2012  9:39 PM   STOP taking these medications         busPIRone 15 MG tablet      CALCIUM + D [phone] MG-UNT-MCG Chew      cyclobenzaprine 10 MG tablet      lisinopril-hydrochlorothiazide 20-12.5 MG per tablet      lubiprostone 24 MCG capsule      montelukast 10 MG tablet      multivitamin with minerals Tabs      oxyCODONE-acetaminophen 5-325 MG per tablet      promethazine 12.5 MG tablet      promethazine 25 MG tablet         TAKE these medications      Indication    albuterol 108 (90 BASE) MCG/ACT inhaler   Commonly known as: PROVENTIL HFA;VENTOLIN HFA   Inhale 2 puffs into the lungs every 6 (six) hours as needed. WHEEZING         amitriptyline 25 MG tablet   Commonly known as: ELAVIL   Take 1 tablet  (25 mg total) by mouth at bedtime. For pain management, depression, and insomnia.       citalopram 20 MG tablet   Commonly known as: CELEXA   Take 1 tablet (20 mg total) by mouth daily. For depression.       dicyclomine 10 MG capsule   Commonly known as: BENTYL   Take 1 capsule (10 mg total) by mouth 4 (four) times daily as needed (cramps). For cramps       DULoxetine 20 MG capsule   Commonly known as: CYMBALTA  Take 1 capsule (20 mg total) by mouth 2 (two) times daily. For depression and pain management.       insulin glargine 100 UNIT/ML injection   Commonly known as: LANTUS   Inject 20 Units into the skin at bedtime. For control of blood sugar       lidocaine 5 %   Commonly known as: LIDODERM   Place 1 patch onto the skin daily. Remove & Discard patch within 12 hours for pain management       meloxicam 7.5 MG tablet   Commonly known as: MOBIC   Take 1 tablet (7.5 mg total) by mouth daily. For inflammation       pantoprazole 40 MG tablet   Commonly known as: PROTONIX   Take 1 tablet (40 mg total) by mouth daily. For control of stomach acid secretion and helps GERD.       pregabalin 75 MG capsule   Commonly known as: LYRICA   Take 1 capsule (75 mg total) by mouth 3 (three) times daily. For neuropathic pain management       traZODone 50 MG tablet   Commonly known as: DESYREL   Take 1 tablet (50 mg total) by mouth at bedtime and may repeat dose one time if needed. For insomnia.       vitamin B-12 1000 MCG tablet   Commonly known as: CYANOCOBALAMIN   Take 1 tablet (1,000 mcg total) by mouth daily. For nutritional supplementation.          ASK your doctor about these medications      Indication    ALPRAZolam 0.5 MG tablet   Commonly known as: XANAX   Take 0.5 mg by mouth daily as needed.            Follow-up recommendations:  Activities: Resume typical activities Diet: Resume typical diet Tests: none Other: Follow up with outpatient provider and report any side  effects to out patient prescriber.   Barby Colvard 02/19/2012, 9:29 PM

## 2012-02-19 NOTE — Progress Notes (Signed)
[facility] Group Notes: (Counselor/Nursing/MHT/Case Management/Adjunct) 02/18/2012  @1 :15pm [employer] [facility]  Type of Therapy:  Group Therapy  Participation Level:  Good  Participation Quality:  Good  Affect:  Appropriate  Cognitive:  Appropriate  Insight:  Good  Engagement in Group:  Good  Engagement in Therapy:  Good  Modes of Intervention:  Support and Exploration  Summary of Progress/Problems: Quincy participated with speaker from [employer] of  and expressed interest in programs [employer] offers. She came to group late but was engaged the entire time she was present and asked appropriate questions.   Billie Lade 02/19/2012  8:22 AM

## 2012-02-19 NOTE — Progress Notes (Signed)
Psychoeducational Group Note  Date:  02/19/2012 Time:  12:04p  Group Topic/Focus:  Relapse Prevention Planning:   The focus of this group is to define relapse and discuss the need for planning to combat relapse.  Participation Level:  Active  Participation Quality:  Appropriate  Affect:  Appropriate  Cognitive:  Appropriate  Insight:  Good  Engagement in Group:  Good  Additional Comments:  Shared that her son was her main focus as to getting better.  Juanda Chance Jvette 02/19/2012, 12:04 PM

## 2012-02-20 LAB — GLUCOSE, CAPILLARY

## 2012-02-20 MED ORDER — PREGABALIN 75 MG PO CAPS: 75.0000 mg | ORAL_CAPSULE | Freq: Three times a day (TID) | ORAL | Status: DC

## 2012-02-20 NOTE — Progress Notes (Signed)
Psychoeducational Group Note  Date:  02/20/2012 Time: 1015  Group Topic/Focus:  Identifying Needs:   The focus of this group is to help patients identify their personal needs that have been historically problematic and identify healthy behaviors to address their needs.  Participation Level:  active Participation Quality: good Affect: flat Cognitive:    Insight:  good  Engagement in Group: engaged  Additional Comments: Pt was engaged in group, asked appropriate questions, shared personal life experience with the group and demonstrates willingness to process and understand her problems. PDuke RN [facility]

## 2012-02-20 NOTE — Progress Notes (Signed)
[facility] Case Management Discharge Plan:  Will you be returning to the same living situation after discharge: Yes,   At discharge, do you have transportation home?:Yes,  Pt.'s mother will pick up-Shirley McDonald-[phone] or [phone] Do you have the ability to pay for your medications:Yes,    Interagency Information:     Release of information consent forms completed and in the chart;  Patient's signature needed at discharge.  Patient to Follow up at:  Follow-up Information    Follow up with [facility] on 02/26/2012. (You are scheduled for an appointment with Dr.Gullapalli on Friday, September 13,2013 at 9:45 am.)    Contact information:   [address] [place], [place] [address]  [phone]         Patient denies SI/HI:   Yes,      Safety Planning and Suicide Prevention discussed:  Yes,    Barrier to discharge identified:No.  Summary and Recommendations: Pt. Will follow up with appointments and will attend support groups. Pt. Was given [place] SI pamphlet and agreed to use numbers if needed.   [name] [place] 02/20/2012, 9:23 AM

## 2012-02-20 NOTE — Progress Notes (Signed)
[facility] Adult [facility] Family/Significant Other Suicide Prevention Education  Suicide Prevention Education:  Education Completed; Shirley McDonald-[phone]-Pt.'s mother has been identified by the patient as the family member/significant other with whom the patient will be residing, and identified as the person(s) who will aid the patient in the event of a mental health crisis (suicidal ideations/suicide attempt).  With written consent from the patient, the family member/significant other has been provided the following suicide prevention education, prior to the and/or following the discharge of the patient.  The suicide prevention education provided includes the following:  Suicide risk factors  Suicide prevention and interventions  National Suicide Hotline telephone number  [place] assessment telephone number  [facility] Emergency Assistance 911  [facility] and/or Residential Mobile Crisis Unit telephone number  Request made of family/significant other to:  Remove weapons (e.g., guns, rifles, knives), all items previously/currently identified as safety concern.  Pt.'s mother states the pt. Does not have access to guns or weapons.  Remove drugs/medications (over-the-counter, prescriptions, illicit drugs), all items previously/currently identified as a safety concern. Pt.'s mother did not have any safety concerns but did want to know if the medication was not to strong for the pt. So she would not bee tired. Pt. Told mother that medication that she was taking was not too strong.  Pt. Mother will be picking the pt. At discharge and feels the pt is safe for discharge today. Pt.'s mother was given [place] SI pamphlet and crisis numbers and agreed to use them if needed.  The family member/significant other verbalizes understanding of the suicide prevention education information provided.  The family member/significant other agrees to remove the items of safety  concern listed above.  Lamar Blinks Jeneen 02/20/2012, 11:10 AM

## 2012-02-20 NOTE — Progress Notes (Signed)
Patient ID: [name], female   DOB: 08-03-1970, 41 y.o.   MRN: [id number]  Problem: Major depressive disorder  D: Pt interacting well with peers in milieu, pt cooperative and pleasant with staff.  A: Monitor patient Q 15 minutes for safety, encourage continued interacting with staff/peers, administer medications as ordered by MD.  R: Pt compliant with medications and participated well in group session. No inappropriate behaviors noted.

## 2012-02-20 NOTE — Progress Notes (Signed)
[facility] Group Notes:  (Counselor/Nursing/MHT/Case Management/Adjunct)  02/20/2012 11:46 AM  Type of Therapy:  After Care Planning Group  Pt. participated in after care planning group. And was given [place] SI pamphlet and crisis hot line numbers and agreed to use them if needed. Pt. Spoke about feeling good and discussed her severe struggle with depression which brought her to [facility]. The pt. Stated that she was feeling much better since coming to [facility] and will follow up with counseling after leaving [facility]. The denied SI and Hi and feels ready for discharge.  Lamar Blinks Meigs 02/20/2012, 11:46 AM

## 2012-02-22 NOTE — Progress Notes (Signed)
Patient Discharge Instructions:  After Visit Summary (AVS):   Faxed to:  02/22/2012 Psychiatric Admission Assessment Note:   Faxed to:  02/22/2012 Suicide Risk Assessment - Discharge Assessment:   Faxed to:  02/22/2012 Faxed/Sent to the Next Level Care provider:  02/22/2012  Faxed to [facility] - Dr. Bayard Males @ [phone]  Heloise Purpura Eduard Clos, 02/22/2012, 3:02 PM

## 2012-03-08 NOTE — Discharge Summary (Signed)
Physician Discharge Summary Note  Patient:  [name] is an 41 y.o., female MRN:  [id number] DOB:  09-16-1970 Patient phone:  [phone] (home)  Patient address:   [address] [place] [address]   Date of Admission:  02/18/2012 Date of Discharge: 02/20/2012  Discharge Diagnoses: Principal Problem:  *Major depressive disorder, recurrent episode, moderate Active Problems:  Compulsive behavior disorder  Axis Diagnosis:  Axis I: Major depressive disorder, recurrent episode, moderate  Axis II: Deferred  Axis III:  Past Medical History   Diagnosis  Date   .  Arthritis    .  Asthma    .  GERD (gastroesophageal reflux disease)    .  Diabetes mellitus    .  Hyperlipidemia    .  Migraines    .  Hypertension    .  Back pain, chronic    .  Depression    .  Neuropathy    .  Anxiety    Axis IV: other psychosocial or environmental problems  Axis V: 41-50 serious symptoms   Level of Care:  OP  Hospital Course:   This is a 41 year old African-American female, [name] with complaints of suicide ideations and increased depression. Patient reports, "I went to [name] Thursday night. My brother took me because I had asked him to take me. It has been 3 weeks that my depression has been getting worse and worse by the day. I am living at home with my mother. That is my major problem right there. Most of my family members and people from the neighborhood are talking trash of me. There was a shop-lifting incident that occurred not too long a ago in a store in our neigborhood. I was mistakenly identified as the person that shop lifted from this store. What actually happened was that I was standing next to the person that actually shop-lifted the merchandise. But my name was printed in the newspapers, and all the people in the neighborhood read about it. Now I am the talk of the town. I mean bad talks and everything. My mother  is now putting it on my face every chance that she gets. My depression has been going on for over 30 years. It started when my father died when I was 62 years old. I am not on any medication for depression and I have not actually seen a psychiatrist for my depression either. I recently had a by-pass surgery this past January. I am still trying to recuperate from the surgery. I was thinking that may be I should not be in the earth any more, but when I remember my 61 year old son, I kind of think, my life is worth saving after all. I have to take care of him. He is my life".  While a patient in this hospital, [name] received medication management for pain, neurogenic pain, insomnia, depression, cramps. They were ordered and received Cymbalta, Elavil, Bentyl, Lidoderm, Celexa, Lyrica, Trazodone for these conditions. They were also enrolled in group counseling sessions and activities in which they participated actively.   Patient attended treatment team meeting this am and met with treatment team members. Pt symptoms, treatment plan and response to treatment discussed. [name] endorsed that their symptoms have improved. Pt also stated that they are stable for discharge.  They reported that from this hospital stay they had learned was how to be more calm and listen  and others have been there too there is always someone who has had it worse than them and that they need to move forward.  In other to maintain control of their mood, pain, and sleep wake cycle, they will continue psychiatric care on outpatient basis. They will follow-up at [name] on 9/13 with [name] at [phone].  In addition they were instructed to attend 90 meetings in 90 days, strongly consider attending [name], to take all your medications as prescribed by your mental healthcare provider, about bipolar disorder, to report any adverse effects and or reactions from your medicines to your outpatient provider  promptly, patient is instructed and cautioned to not engage in alcohol and or illegal drug use while on prescription medicines, in the event of worsening symptoms, patient is instructed to call the crisis hotline, 911 and or go to the nearest ED for appropriate evaluation and treatment of symptoms.   Upon discharge, patient adamantly denies suicidal, homicidal ideations, auditory, visual hallucinations and or delusional thinking. They left [name] with all personal belongings via personal transportation in no apparent distress.  Consults:  None  Significant Diagnostic Studies:  Labs: UDS positive for Amphetamines, Alkaline Phosphatasse elevated at 118, Albumin low at 3.4, Hemglobin A1c at 8.4, H/H low at 10.3 and 31.6, Thyroid tests, BAL non contributory  Discharge Vitals:   Blood pressure 137/89, pulse 94, temperature 98 F (36.7 C), temperature source Oral, resp. rate 16, height 5\' 4"  (1.626 m), weight 78.926 kg (174 lb), last menstrual period 02/17/2012..  Mental Status Exam: See Mental Status Examination and Suicide Risk Assessment completed by Attending Physician prior to discharge.  Discharge destination:  Home  Is patient on multiple antipsychotic therapies at discharge:  No  Has Patient had three or more failed trials of antipsychotic monotherapy by history: N/A Recommended Plan for Multiple Antipsychotic Therapies: N/A    Medication List     As of 03/08/2012  9:19 PM    STOP taking these medications         ALPRAZolam 0.5 MG tablet   Commonly known as: XANAX      busPIRone 15 MG tablet   Commonly known as: BUSPAR      CALCIUM + D [phone] MG-UNT-MCG Chew   Generic drug: Calcium-Vitamin D-Vitamin K      cyclobenzaprine 10 MG tablet   Commonly known as: FLEXERIL      lisinopril-hydrochlorothiazide 20-12.5 MG per tablet   Commonly known as: PRINZIDE,ZESTORETIC      lubiprostone 24 MCG capsule   Commonly known as: AMITIZA      montelukast 10 MG tablet   Commonly  known as: SINGULAIR      multivitamin with minerals Tabs      oxyCODONE-acetaminophen 5-325 MG per tablet   Commonly known as: PERCOCET/ROXICET      promethazine 25 MG tablet   Commonly known as: PHENERGAN      TAKE these medications      Indication    albuterol 108 (90 BASE) MCG/ACT inhaler   Commonly known as: PROVENTIL HFA;VENTOLIN HFA   Inhale 2 puffs into the lungs every 6 (six) hours as needed. WHEEZING         amitriptyline 25 MG tablet   Commonly known as: ELAVIL   Take 1 tablet (25 mg total) by mouth at bedtime. For pain management, depression, and insomnia.       citalopram 20 MG tablet   Commonly known as: CELEXA   Take 1 tablet (20 mg  total) by mouth daily. For depression.       dicyclomine 10 MG capsule   Commonly known as: BENTYL   Take 1 capsule (10 mg total) by mouth 4 (four) times daily as needed (cramps). For cramps       DULoxetine 20 MG capsule   Commonly known as: CYMBALTA   Take 1 capsule (20 mg total) by mouth 2 (two) times daily. For depression and pain management.       insulin glargine 100 UNIT/ML injection   Commonly known as: LANTUS   Inject 20 Units into the skin at bedtime. For control of blood sugar       lidocaine 5 %   Commonly known as: LIDODERM   Place 1 patch onto the skin daily. Remove & Discard patch within 12 hours for pain management       meloxicam 7.5 MG tablet   Commonly known as: MOBIC   Take 1 tablet (7.5 mg total) by mouth daily. For inflammation       pantoprazole 40 MG tablet   Commonly known as: PROTONIX   Take 1 tablet (40 mg total) by mouth daily. For control of stomach acid secretion and helps GERD.       pregabalin 75 MG capsule   Commonly known as: LYRICA   Take 1 capsule (75 mg total) by mouth 3 (three) times daily. neuropathic pain management       traZODone 50 MG tablet   Commonly known as: DESYREL   Take 1 tablet (50 mg total) by mouth at bedtime and may repeat dose one time if needed. For insomnia.        vitamin B-12 1000 MCG tablet   Commonly known as: CYANOCOBALAMIN   Take 1 tablet (1,000 mcg total) by mouth daily. For nutritional supplementation.            Follow-up Information    Follow up with [facility] on 02/26/2012. (You are scheduled for an appointment with [facility] on Friday, September 13,2013 at 9:45 am.)    Contact information:   [address] [place], [place] [address]  [phone]        Follow-up recommendations:   Activities: Resume typical activities Diet: Resume typical diet Tests: none Other: Follow up with outpatient provider and report any side effects to out patient prescriber.  Comments:  Take all your medications as prescribed by your mental healthcare provider. Report any adverse effects and or reactions from your medicines to your outpatient provider promptly. Patient is instructed and cautioned to not engage in alcohol and or illegal drug use while on prescription medicines. In the event of worsening symptoms, patient is instructed to call the crisis hotline, 911 and or go to the nearest ED for appropriate evaluation and treatment of symptoms. Follow-up with your primary care provider for your other medical issues, concerns and or health care needs.  SignedOrson Aloe 03/08/2012 9:19 PM

## 2012-03-13 ENCOUNTER — Encounter (HOSPITAL_COMMUNITY): Payer: Self-pay | Admitting: Emergency Medicine

## 2012-03-13 ENCOUNTER — Inpatient Hospital Stay (HOSPITAL_COMMUNITY): Payer: Medicare HMO

## 2012-03-13 ENCOUNTER — Inpatient Hospital Stay (HOSPITAL_COMMUNITY)
Admission: EM | Admit: 2012-03-13 | Discharge: 2012-03-17 | DRG: 395 | Disposition: A | Discharge status: Home / Self Care | Payer: Medicare HMO | Attending: Internal Medicine | Admitting: Internal Medicine

## 2012-03-13 DIAGNOSIS — E119 Type 2 diabetes mellitus without complications: Secondary | ICD-10-CM

## 2012-03-13 DIAGNOSIS — E1142 Type 2 diabetes mellitus with diabetic polyneuropathy: Secondary | ICD-10-CM | POA: Diagnosis present

## 2012-03-13 DIAGNOSIS — F605 Obsessive-compulsive personality disorder: Secondary | ICD-10-CM | POA: Diagnosis present

## 2012-03-13 DIAGNOSIS — J019 Acute sinusitis, unspecified: Secondary | ICD-10-CM | POA: Diagnosis present

## 2012-03-13 DIAGNOSIS — Z9089 Acquired absence of other organs: Secondary | ICD-10-CM

## 2012-03-13 DIAGNOSIS — K5289 Other specified noninfective gastroenteritis and colitis: Secondary | ICD-10-CM

## 2012-03-13 DIAGNOSIS — K921 Melena: Secondary | ICD-10-CM

## 2012-03-13 DIAGNOSIS — G4700 Insomnia, unspecified: Secondary | ICD-10-CM | POA: Diagnosis present

## 2012-03-13 DIAGNOSIS — D500 Iron deficiency anemia secondary to blood loss (chronic): Secondary | ICD-10-CM | POA: Diagnosis present

## 2012-03-13 DIAGNOSIS — Z9884 Bariatric surgery status: Secondary | ICD-10-CM

## 2012-03-13 DIAGNOSIS — R112 Nausea with vomiting, unspecified: Secondary | ICD-10-CM

## 2012-03-13 DIAGNOSIS — F419 Anxiety disorder, unspecified: Secondary | ICD-10-CM

## 2012-03-13 DIAGNOSIS — E1140 Type 2 diabetes mellitus with diabetic neuropathy, unspecified: Secondary | ICD-10-CM

## 2012-03-13 DIAGNOSIS — E1149 Type 2 diabetes mellitus with other diabetic neurological complication: Secondary | ICD-10-CM | POA: Diagnosis present

## 2012-03-13 DIAGNOSIS — K529 Noninfective gastroenteritis and colitis, unspecified: Secondary | ICD-10-CM

## 2012-03-13 DIAGNOSIS — R5381 Other malaise: Secondary | ICD-10-CM

## 2012-03-13 DIAGNOSIS — K648 Other hemorrhoids: Principal | ICD-10-CM | POA: Diagnosis present

## 2012-03-13 DIAGNOSIS — Z6833 Body mass index (BMI) 33.0-33.9, adult: Secondary | ICD-10-CM

## 2012-03-13 DIAGNOSIS — K219 Gastro-esophageal reflux disease without esophagitis: Secondary | ICD-10-CM | POA: Diagnosis present

## 2012-03-13 DIAGNOSIS — F488 Other specified nonpsychotic mental disorders: Secondary | ICD-10-CM | POA: Diagnosis not present

## 2012-03-13 DIAGNOSIS — J45909 Unspecified asthma, uncomplicated: Secondary | ICD-10-CM | POA: Diagnosis present

## 2012-03-13 DIAGNOSIS — D509 Iron deficiency anemia, unspecified: Secondary | ICD-10-CM | POA: Diagnosis present

## 2012-03-13 DIAGNOSIS — D649 Anemia, unspecified: Secondary | ICD-10-CM

## 2012-03-13 DIAGNOSIS — Z794 Long term (current) use of insulin: Secondary | ICD-10-CM

## 2012-03-13 DIAGNOSIS — R531 Weakness: Secondary | ICD-10-CM | POA: Diagnosis present

## 2012-03-13 DIAGNOSIS — E860 Dehydration: Secondary | ICD-10-CM | POA: Diagnosis present

## 2012-03-13 DIAGNOSIS — Z833 Family history of diabetes mellitus: Secondary | ICD-10-CM

## 2012-03-13 DIAGNOSIS — F411 Generalized anxiety disorder: Secondary | ICD-10-CM | POA: Diagnosis present

## 2012-03-13 DIAGNOSIS — E559 Vitamin D deficiency, unspecified: Secondary | ICD-10-CM | POA: Diagnosis present

## 2012-03-13 DIAGNOSIS — IMO0002 Reserved for concepts with insufficient information to code with codable children: Secondary | ICD-10-CM | POA: Diagnosis present

## 2012-03-13 DIAGNOSIS — IMO0001 Reserved for inherently not codable concepts without codable children: Secondary | ICD-10-CM | POA: Diagnosis present

## 2012-03-13 DIAGNOSIS — K3184 Gastroparesis: Secondary | ICD-10-CM | POA: Diagnosis present

## 2012-03-13 DIAGNOSIS — G43909 Migraine, unspecified, not intractable, without status migrainosus: Secondary | ICD-10-CM | POA: Diagnosis present

## 2012-03-13 DIAGNOSIS — R1912 Hyperactive bowel sounds: Secondary | ICD-10-CM | POA: Diagnosis present

## 2012-03-13 DIAGNOSIS — R1084 Generalized abdominal pain: Secondary | ICD-10-CM | POA: Diagnosis present

## 2012-03-13 HISTORY — DX: Other specified nonpsychotic mental disorders: F48.8

## 2012-03-13 HISTORY — DX: Insomnia, unspecified: G47.00

## 2012-03-13 HISTORY — DX: Fibromyalgia: M79.7

## 2012-03-13 HISTORY — DX: Bariatric surgery status: Z98.84

## 2012-03-13 LAB — BASIC METABOLIC PANEL
CO2: 22 mEq/L (ref 19–32)
Chloride: 107 mEq/L (ref 96–112)
Creatinine, Ser: 0.52 mg/dL (ref 0.50–1.10)
GFR calc Af Amer: >90 mL/min (ref >90)
Potassium: 3.6 mEq/L (ref 3.5–5.1)
Sodium: 139 mEq/L (ref 135–145)

## 2012-03-13 LAB — VITAMIN B12: Vitamin B-12: >2000 pg/mL — ABNORMAL HIGH (ref 211–911)

## 2012-03-13 LAB — MAGNESIUM: Magnesium: 2.1 mg/dL (ref 1.5–2.5)

## 2012-03-13 LAB — CBC WITH DIFFERENTIAL/PLATELET
Basophils Absolute: 0 10*3/uL (ref 0.0–0.1)
HCT: 28.6 % — ABNORMAL LOW (ref 36.0–46.0)
Hemoglobin: 9.2 g/dL — ABNORMAL LOW (ref 12.0–15.0)
Lymphocytes Relative: 18 % (ref 12–46)
Lymphs Abs: 1.7 10*3/uL (ref 0.7–4.0)
Monocytes Absolute: 0.5 10*3/uL (ref 0.1–1.0)
Neutro Abs: 7 10*3/uL (ref 1.7–7.7)
RBC: 3.63 MIL/uL — ABNORMAL LOW (ref 3.87–5.11)
RDW: 14.4 % (ref 11.5–15.5)
WBC: 9.3 10*3/uL (ref 4.0–10.5)

## 2012-03-13 LAB — HEMOGLOBIN A1C
Hgb A1c MFr Bld: 7.9 % — ABNORMAL HIGH (ref <5.7)
Mean Plasma Glucose: 180 mg/dL — ABNORMAL HIGH (ref <117)

## 2012-03-13 LAB — TYPE AND SCREEN: Antibody Screen: NEGATIVE

## 2012-03-13 LAB — TSH: TSH: 0.553 u[IU]/mL (ref 0.350–4.500)

## 2012-03-13 LAB — PHOSPHORUS: Phosphorus: 3.5 mg/dL (ref 2.3–4.6)

## 2012-03-13 LAB — IRON AND TIBC: TIBC: 401 ug/dL (ref 250–470)

## 2012-03-13 MED ORDER — TRAZODONE HCL 50 MG PO TABS
50.0000 mg | ORAL_TABLET | Freq: Every evening | ORAL | Status: DC | PRN
  Administered 2012-03-13 – 2012-03-16 (×3): 50 mg via ORAL
  Filled 2012-03-13 (×10): qty 1

## 2012-03-13 MED ORDER — ALBUTEROL SULFATE HFA 108 (90 BASE) MCG/ACT IN AERS
2.0000 | INHALATION_SPRAY | Freq: Four times a day (QID) | RESPIRATORY_TRACT | Status: DC | PRN
  Administered 2012-03-16 (×2): 2 via RESPIRATORY_TRACT
  Filled 2012-03-13 (×2): qty 6.7

## 2012-03-13 MED ORDER — INSULIN GLARGINE 100 UNIT/ML ~~LOC~~ SOLN
14.0000 [IU] | Freq: Every day | SUBCUTANEOUS | Status: DC
  Administered 2012-03-13 – 2012-03-16 (×3): 14 [IU] via SUBCUTANEOUS

## 2012-03-13 MED ORDER — LIDOCAINE 5 % EX PTCH
1.0000 | MEDICATED_PATCH | CUTANEOUS | Status: DC
  Administered 2012-03-13 – 2012-03-16 (×4): 1 via TRANSDERMAL
  Filled 2012-03-13 (×5): qty 1

## 2012-03-13 MED ORDER — PANTOPRAZOLE SODIUM 40 MG PO TBEC
40.0000 mg | DELAYED_RELEASE_TABLET | Freq: Every day | ORAL | Status: DC
  Administered 2012-03-14 – 2012-03-16 (×3): 40 mg via ORAL
  Filled 2012-03-13 (×4): qty 1

## 2012-03-13 MED ORDER — BIOTIN 1000 MCG PO TABS: 1000.0000 ug | ORAL_TABLET | Freq: Every day | ORAL | Status: DC

## 2012-03-13 MED ORDER — DICYCLOMINE HCL 10 MG PO CAPS
10.0000 mg | ORAL_CAPSULE | Freq: Four times a day (QID) | ORAL | Status: DC | PRN
  Filled 2012-03-13: qty 1

## 2012-03-13 MED ORDER — LISINOPRIL 20 MG PO TABS
20.0000 mg | ORAL_TABLET | Freq: Every day | ORAL | Status: DC
  Administered 2012-03-14 – 2012-03-17 (×4): 20 mg via ORAL
  Filled 2012-03-13 (×4): qty 1

## 2012-03-13 MED ORDER — OXYCODONE-ACETAMINOPHEN 5-325 MG PO TABS
1.0000 | ORAL_TABLET | ORAL | Status: DC | PRN
  Administered 2012-03-13 – 2012-03-17 (×10): 2 via ORAL
  Filled 2012-03-13 (×10): qty 2

## 2012-03-13 MED ORDER — INSULIN ASPART 100 UNIT/ML ~~LOC~~ SOLN
0.0000 [IU] | Freq: Three times a day (TID) | SUBCUTANEOUS | Status: DC
  Administered 2012-03-15: 1 [IU] via SUBCUTANEOUS
  Administered 2012-03-16 – 2012-03-17 (×3): 2 [IU] via SUBCUTANEOUS

## 2012-03-13 MED ORDER — CITALOPRAM HYDROBROMIDE 20 MG PO TABS
20.0000 mg | ORAL_TABLET | Freq: Every day | ORAL | Status: DC
  Administered 2012-03-14 – 2012-03-17 (×3): 20 mg via ORAL
  Filled 2012-03-13 (×4): qty 1

## 2012-03-13 MED ORDER — PREGABALIN 75 MG PO CAPS
75.0000 mg | ORAL_CAPSULE | Freq: Three times a day (TID) | ORAL | Status: DC
  Administered 2012-03-13 – 2012-03-17 (×11): 75 mg via ORAL
  Filled 2012-03-13 (×11): qty 1

## 2012-03-13 MED ORDER — LISINOPRIL-HYDROCHLOROTHIAZIDE 20-25 MG PO TABS: 1.0000 | ORAL_TABLET | Freq: Every day | ORAL | Status: DC

## 2012-03-13 MED ORDER — MONTELUKAST SODIUM 10 MG PO TABS
10.0000 mg | ORAL_TABLET | Freq: Every day | ORAL | Status: DC
  Administered 2012-03-13 – 2012-03-16 (×4): 10 mg via ORAL
  Filled 2012-03-13 (×5): qty 1

## 2012-03-13 MED ORDER — VITAMIN B-12 1000 MCG PO TABS
1000.0000 ug | ORAL_TABLET | Freq: Every day | ORAL | Status: DC
  Administered 2012-03-14 – 2012-03-17 (×4): 1000 ug via ORAL
  Filled 2012-03-13 (×4): qty 1

## 2012-03-13 MED ORDER — ONDANSETRON HCL 4 MG PO TABS
4.0000 mg | ORAL_TABLET | Freq: Four times a day (QID) | ORAL | Status: DC | PRN
  Administered 2012-03-17: 4 mg via ORAL
  Filled 2012-03-13: qty 1

## 2012-03-13 MED ORDER — HYOSCYAMINE SULFATE 0.125 MG PO TABS
0.2500 mg | ORAL_TABLET | Freq: Once | ORAL | Status: AC
  Administered 2012-03-13: 0.25 mg via ORAL
  Filled 2012-03-13: qty 2

## 2012-03-13 MED ORDER — CALCIUM CARBONATE-VITAMIN D 500-200 MG-UNIT PO TABS
1.0000 | ORAL_TABLET | Freq: Every day | ORAL | Status: DC
  Administered 2012-03-14 – 2012-03-17 (×4): 1 via ORAL
  Filled 2012-03-13 (×4): qty 1

## 2012-03-13 MED ORDER — DULOXETINE HCL 20 MG PO CPEP
20.0000 mg | ORAL_CAPSULE | Freq: Two times a day (BID) | ORAL | Status: DC
  Administered 2012-03-13 – 2012-03-17 (×7): 20 mg via ORAL
  Filled 2012-03-13 (×9): qty 1

## 2012-03-13 MED ORDER — ACETAMINOPHEN 325 MG PO TABS
650.0000 mg | ORAL_TABLET | Freq: Once | ORAL | Status: AC
  Administered 2012-03-13: 650 mg via ORAL
  Filled 2012-03-13: qty 1
  Filled 2012-03-13: qty 2

## 2012-03-13 MED ORDER — DICLOFENAC SODIUM 1 % TD GEL
2.0000 g | Freq: Four times a day (QID) | TRANSDERMAL | Status: DC | PRN
  Administered 2012-03-14 – 2012-03-15 (×2): 2 g via TOPICAL
  Filled 2012-03-13 (×2): qty 100

## 2012-03-13 MED ORDER — ONDANSETRON HCL 4 MG/2ML IJ SOLN
4.0000 mg | Freq: Four times a day (QID) | INTRAMUSCULAR | Status: DC | PRN
  Administered 2012-03-14 – 2012-03-15 (×2): 4 mg via INTRAVENOUS
  Filled 2012-03-13 (×3): qty 2

## 2012-03-13 MED ORDER — SODIUM CHLORIDE 0.9 % IV SOLN
INTRAVENOUS | Status: DC
  Administered 2012-03-13 – 2012-03-14 (×2): via INTRAVENOUS

## 2012-03-13 MED ORDER — SODIUM CHLORIDE 0.9 % IV SOLN
INTRAVENOUS | Status: AC
  Administered 2012-03-13: 125 mL/h via INTRAVENOUS

## 2012-03-13 MED ORDER — ONDANSETRON HCL 4 MG/2ML IJ SOLN
4.0000 mg | Freq: Once | INTRAMUSCULAR | Status: AC
  Administered 2012-03-13: 4 mg via INTRAVENOUS
  Filled 2012-03-13: qty 2

## 2012-03-13 MED ORDER — HYDROCHLOROTHIAZIDE 25 MG PO TABS
25.0000 mg | ORAL_TABLET | Freq: Every day | ORAL | Status: DC
  Administered 2012-03-14 – 2012-03-17 (×4): 25 mg via ORAL
  Filled 2012-03-13 (×4): qty 1

## 2012-03-13 MED ORDER — AMITRIPTYLINE HCL 25 MG PO TABS
25.0000 mg | ORAL_TABLET | Freq: Every day | ORAL | Status: DC
  Administered 2012-03-13 – 2012-03-16 (×4): 25 mg via ORAL
  Filled 2012-03-13 (×5): qty 1

## 2012-03-13 NOTE — H&P (Signed)
History and Physical Examination  Date: 03/13/2012  Patient name: [name] Medical record number: [id number] Date of birth: 12-09-1970 Age: 41 y.o. Gender: female PCP: [name], MD Gastroenterologist:  Dr. Lenoria Farrier  Chief Complaint:  Chief Complaint  Patient presents with  . Abdominal Pain    History of Present Illness: [name] is an 41 y.o. female with chronic pain, chronic back pain, status post recent gastric bypass January 2013, presented to the emergency department complaining of 4-5 days of bloody diarrhea.  The patient reports she's been having an acute exacerbation of her chronic abdominal pain over the past several days.  She reports no changes in her diet.  She reports generalized pain that is nonspecific.  She reports that 4 days ago she initially saw blood in her stool.  She also reports that she's had some low-grade temperature. The patient reports nausea and occasional emesis.   The patient reports that she's lost approximately 70 pounds since she had her gastric bypass 10 months ago.  The patient reports that she is on a very low carbohydrate diet because of her history of poorly controlled diabetes mellitus.  She reports that her hemoglobin A1c has improved from 14% prior to gastric bypass 27% post gastric bypass.  The patient has complications of diabetes including neuropathy.  She has an unknown history of gastroparesis.  In the emergency department she was found to have an anemia.  Her hemoglobin decreased from 9 to 8.  The patient was clinically dehydrated.  The patient is also complaining of sinus drainage and purulent foul-smelling drainage from sinuses.  The hospitalization was requested for further evaluation and management.  Pt says she had a colonoscopy more than 20 years ago but cannot remember the results.   Past Medical History Past Medical History  Diagnosis Date  . Arthritis   . Asthma   . GERD (gastroesophageal reflux disease)   . Diabetes  mellitus   . Hyperlipidemia   . Migraines   . Hypertension   . Back pain, chronic   . Depression   . Neuropathy   . Anxiety   . Nervous breakdown sept 2013  . Fibromyalgia   . History of gastric bypass Jan 2013  . Insomnia     Past Surgical History Past Surgical History  Procedure Date  . Back surgery November 1992  . Cesarean section December 1998  . Tubal ligation January 1999  . Cholecystectomy 1998  . Gastric roux-en-y 06/30/2011    Procedure: LAPAROSCOPIC ROUX-EN-Y GASTRIC;  Surgeon: Mariella Saa, MD;  Location: [facility];  Service: General;  Laterality: N/A;     . Esophagogastroduodenoscopy 07/27/2011    Procedure: ESOPHAGOGASTRODUODENOSCOPY (EGD);  Surgeon: Freddy Jaksch, MD;  Location: [facility];  Service: Endoscopy;  Laterality: N/A;    Home Meds: Prior to Admission medications   Medication Sig Start Date End Date Taking? Authorizing Provider  albuterol (PROVENTIL HFA;VENTOLIN HFA) 108 (90 BASE) MCG/ACT inhaler Inhale 2 puffs into the lungs every 6 (six) hours as needed. WHEEZING    Yes Historical Provider, MD  amitriptyline (ELAVIL) 25 MG tablet Take 1 tablet (25 mg total) by mouth at bedtime. For pain management, depression, and insomnia. 02/19/12 02/18/13 Yes Mike Craze, MD  Biotin 1000 MCG tablet Take 1,000 mcg by mouth daily.   Yes Historical Provider, MD  calcium-vitamin D (OSCAL WITH D) 500-200 MG-UNIT per tablet Take 1 tablet by mouth daily.   Yes Historical Provider, MD  citalopram (CELEXA) 20 MG  tablet Take 1 tablet (20 mg total) by mouth daily. For depression. 02/19/12 02/18/13 Yes Mike Craze, MD  dicyclomine (BENTYL) 10 MG capsule Take 1 capsule (10 mg total) by mouth 4 (four) times daily as needed (cramps). For cramps 02/19/12 02/18/13 Yes Mike Craze, MD  DULoxetine (CYMBALTA) 20 MG capsule Take 1 capsule (20 mg total) by mouth 2 (two) times daily. For depression and pain management. 02/19/12 02/18/13 Yes Mike Craze, MD  insulin glargine (LANTUS)  100 UNIT/ML injection Inject 20 Units into the skin at bedtime. For control of blood sugar 02/19/12  Yes Mike Craze, MD  lidocaine (LIDODERM) 5 % Place 1 patch onto the skin daily. Remove & Discard patch within 12 hours for pain management 02/19/12 03/20/12 Yes Mike Craze, MD  lisinopril-hydrochlorothiazide (PRINZIDE,ZESTORETIC) 20-25 MG per tablet Take 1 tablet by mouth daily.   Yes Historical Provider, MD  meloxicam (MOBIC) 7.5 MG tablet Take 1 tablet (7.5 mg total) by mouth daily. For inflammation 02/19/12 02/18/13 Yes Mike Craze, MD  montelukast (SINGULAIR) 10 MG tablet Take 10 mg by mouth at bedtime.   Yes Historical Provider, MD  pantoprazole (PROTONIX) 40 MG tablet Take 1 tablet (40 mg total) by mouth daily. For control of stomach acid secretion and helps GERD. 02/19/12  Yes Mike Craze, MD  pregabalin (LYRICA) 75 MG capsule Take 1 capsule (75 mg total) by mouth 3 (three) times daily. neuropathic pain management 02/20/12 02/19/13 Yes Wonda Cerise, MD  traZODone (DESYREL) 50 MG tablet Take 1 tablet (50 mg total) by mouth at bedtime and may repeat dose one time if needed. For insomnia. 02/19/12 03/20/12 Yes Mike Craze, MD  vitamin B-12 (CYANOCOBALAMIN) 1000 MCG tablet Take 1 tablet (1,000 mcg total) by mouth daily. For nutritional supplementation. 02/19/12  Yes Mike Craze, MD    Allergies: Imitrex  Social History:  History   Social History  . Marital Status: Single    Spouse Name: N/A    Number of Children: N/A  . Years of Education: N/A   Occupational History  . Not on file.   Social History Main Topics  . Smoking status: Never Smoker   . Smokeless tobacco: Never Used  . Alcohol Use: No  . Drug Use: No  . Sexually Active: No   Other Topics Concern  . Not on file   Social History Narrative  . No narrative on file   Family History:  Family History  Problem Relation Age of Onset  . Diabetes Maternal Aunt   . Diabetes Paternal Aunt   . Diabetes Paternal Grandfather      Review of Systems: Pertinent items are noted in HPI. All other systems reviewed and reported as negative.   Physical Exam: Blood pressure 134/79, pulse 90, temperature 98.1 F (36.7 C), temperature source Oral, resp. rate 18, height 5' 5.5" (1.664 m), weight 83.5 kg (184 lb 1.4 oz), last menstrual period 03/12/2012, SpO2 100.00%. General appearance: alert, cooperative, appears stated age and no distress Head: Normocephalic, without obvious abnormality, atraumatic, sinuses tender to percussion Eyes: negative Nose: mild congestion, turbinates swollen, sinus tenderness bilateral Throat: lips, mucosa, and tongue normal; teeth and gums normal Neck: no adenopathy, no carotid bruit, no JVD, supple, symmetrical, trachea midline and thyroid not enlarged, symmetric, no tenderness/mass/nodules Lungs: clear to auscultation bilaterally and normal percussion bilaterally Heart: S1, S2 normal Abdomen: Soft, generalized tenderness with palpation, no guarding, no rebound tenderness, no masses palpated, hyperactive bowel sounds noted, suprapubic tenderness noted  Extremities: extremities normal, atraumatic, no cyanosis or edema Skin: Skin color, texture, turgor normal. No rashes or lesions Neurologic: Grossly normal  Lab  And Imaging results:  Results for orders placed during the hospital encounter of 03/13/12 (from the past 24 hour(s))  BASIC METABOLIC PANEL     Status: Abnormal   Collection Time   03/13/12 11:55 AM      Component Value Range   Sodium 139  135 - 145 mEq/L   Potassium 3.6  3.5 - 5.1 mEq/L   Chloride 107  96 - 112 mEq/L   CO2 22  19 - 32 mEq/L   Glucose, Bld 166 (*) 70 - 99 mg/dL   BUN 15  6 - 23 mg/dL   Creatinine, Ser 1.61  0.50 - 1.10 mg/dL   Calcium 8.6  8.4 - 09.6 mg/dL   GFR calc non Af Amer >90  >90 mL/min   GFR calc Af Amer >90  >90 mL/min  CBC WITH DIFFERENTIAL     Status: Abnormal   Collection Time   03/13/12 11:55 AM      Component Value Range   WBC 9.3  4.0 - 10.5  K/uL   RBC 3.63 (*) 3.87 - 5.11 MIL/uL   Hemoglobin 9.2 (*) 12.0 - 15.0 g/dL   HCT 04.5 (*) 40.9 - 81.1 %   MCV 78.8  78.0 - 100.0 fL   MCH 25.3 (*) 26.0 - 34.0 pg   MCHC 32.2  30.0 - 36.0 g/dL   RDW 91.4  78.2 - 95.6 %   Platelets 385  150 - 400 K/uL   Neutrophils Relative 76  43 - 77 %   Neutro Abs 7.0  1.7 - 7.7 K/uL   Lymphocytes Relative 18  12 - 46 %   Lymphs Abs 1.7  0.7 - 4.0 K/uL   Monocytes Relative 5  3 - 12 %   Monocytes Absolute 0.5  0.1 - 1.0 K/uL   Eosinophils Relative 2  0 - 5 %   Eosinophils Absolute 0.1  0.0 - 0.7 K/uL   Basophils Relative 0  0 - 1 %   Basophils Absolute 0.0  0.0 - 0.1 K/uL     Impression   *Hematochezia  Gross rectal bleeding   Degenerative disc disease  Nausea and vomiting  Compulsive behavior disorder  History of gastric bypass  Anxiety disorder  Nervous breakdown  Anemia  DM type 2 (diabetes mellitus, type 2)  Generalized weakness  Generalized abdominal pain  Hyperactive bowel sounds  Dehydration  Insomnia  Diabetic neuropathy  Migraine, unspecified, without mention of intractable migraine without mention of status migrainosus  Acute Sinusitis  Plan  Admit to med-surg, follow hemoglobin, IVFs for hydration, resume home meds for anxiety, pain meds for chronic lower back pain, check vit D level, Mg, Phos, b12, folate, ask for GI consult regarding rectal bleeding.  Ordered stool studies, culture, O&P, c.diff, PT/OT consult, monitor BS, carb modified diet, dietician consult, Please see orders and follow hospital course.    Standley Dakins MD [facility] [place], [place] [phone] 03/13/2012, 4:14 PM

## 2012-03-13 NOTE — ED Provider Notes (Addendum)
History     CSN: [id number]  Arrival date & time 03/13/12  1051   First MD Initiated Contact with Patient 03/13/12 1105      Chief Complaint  Patient presents with  . Abdominal Pain    (Consider location/radiation/quality/duration/timing/severity/associated sxs/prior treatment) Patient is a 41 y.o. female presenting with abdominal pain. The history is provided by the patient and medical records.  Abdominal Pain The primary symptoms of the illness include abdominal pain, nausea, vomiting and diarrhea. The primary symptoms of the illness do not include fever, shortness of breath or dysuria.  Symptoms associated with the illness do not include chills or diaphoresis.   41 year old, female, with history of gastric bypass, and GERD, presents to emergency department complaining of abdominal pain, with nausea, vomiting, and diarrhea for several days.  She has not had a fever, and chills, rash.  She has not had recent antibiotic use.  She has not been exposed to anybody with similar symptoms.  She denies lightheadedness, or shortness of breath.  Past Medical History  Diagnosis Date  . Arthritis   . Asthma   . GERD (gastroesophageal reflux disease)   . Diabetes mellitus   . Hyperlipidemia   . Migraines   . Hypertension   . Back pain, chronic   . Depression   . Neuropathy   . Anxiety     Past Surgical History  Procedure Date  . Back surgery November 1992  . Cesarean section December 1998  . Tubal ligation January 1999  . Cholecystectomy 1998  . Gastric roux-en-y 06/30/2011    Procedure: LAPAROSCOPIC ROUX-EN-Y GASTRIC;  Surgeon: Mariella Saa, MD;  Location: [facility];  Service: General;  Laterality: N/A;     . Esophagogastroduodenoscopy 07/27/2011    Procedure: ESOPHAGOGASTRODUODENOSCOPY (EGD);  Surgeon: Freddy Jaksch, MD;  Location: [facility];  Service: Endoscopy;  Laterality: N/A;    Family History  Problem Relation Age of Onset  . Diabetes Maternal Aunt   . Diabetes  Paternal Aunt   . Diabetes Paternal Grandfather     History  Substance Use Topics  . Smoking status: Never Smoker   . Smokeless tobacco: Never Used  . Alcohol Use: No    OB History    Grav Para Term Preterm Abortions TAB SAB Ect Mult Living                  Review of Systems  Constitutional: Negative for fever, chills and diaphoresis.  Respiratory: Negative for shortness of breath.   Gastrointestinal: Positive for nausea, vomiting, abdominal pain and diarrhea.  Genitourinary: Negative for dysuria.  Skin: Negative for rash.  Neurological: Negative for light-headedness.  Hematological: Does not bruise/bleed easily.  Psychiatric/Behavioral: Negative for confusion.  All other systems reviewed and are negative.    Allergies  Imitrex  Home Medications   Current Outpatient Rx  Name Route Sig Dispense Refill  . ALBUTEROL SULFATE HFA 108 (90 BASE) MCG/ACT IN AERS Inhalation Inhale 2 puffs into the lungs every 6 (six) hours as needed. WHEEZING     . AMITRIPTYLINE HCL 25 MG PO TABS Oral Take 1 tablet (25 mg total) by mouth at bedtime. For pain management, depression, and insomnia. 30 tablet 0  . CITALOPRAM HYDROBROMIDE 20 MG PO TABS Oral Take 1 tablet (20 mg total) by mouth daily. For depression. 30 tablet 0  . DICYCLOMINE HCL 10 MG PO CAPS Oral Take 1 capsule (10 mg total) by mouth 4 (four) times daily as needed (cramps). For cramps 30 capsule  0  . DULOXETINE HCL 20 MG PO CPEP Oral Take 1 capsule (20 mg total) by mouth 2 (two) times daily. For depression and pain management. 30 capsule 0  . INSULIN GLARGINE 100 UNIT/ML [place] SOLN Subcutaneous Inject 20 Units into the skin at bedtime. For control of blood sugar 10 mL 0  . LIDOCAINE 5 % EX PTCH Transdermal Place 1 patch onto the skin daily. Remove & Discard patch within 12 hours for pain management 30 patch 0  . MELOXICAM 7.5 MG PO TABS Oral Take 1 tablet (7.5 mg total) by mouth daily. For inflammation 30 tablet 0  . PANTOPRAZOLE  SODIUM 40 MG PO TBEC Oral Take 1 tablet (40 mg total) by mouth daily. For control of stomach acid secretion and helps GERD. 30 tablet 0  . PREGABALIN 75 MG PO CAPS Oral Take 1 capsule (75 mg total) by mouth 3 (three) times daily. neuropathic pain management 90 capsule 0  . TRAZODONE HCL 50 MG PO TABS Oral Take 1 tablet (50 mg total) by mouth at bedtime and may repeat dose one time if needed. For insomnia. 45 tablet 0  . VITAMIN B-12 1000 MCG PO TABS Oral Take 1 tablet (1,000 mcg total) by mouth daily. For nutritional supplementation. 30 tablet 0    BP 147/79  Pulse 85  Temp 98.2 F (36.8 C) (Oral)  Resp 18  Ht 5' 5.5" (1.664 m)  SpO2 100%  LMP 03/12/2012  Physical Exam  Nursing note and vitals reviewed. Constitutional: She is oriented to person, place, and time. She appears well-developed and well-nourished. No distress.  HENT:  Head: Normocephalic and atraumatic.  Eyes: Conjunctivae normal and EOM are normal.  Neck: Normal range of motion. Neck supple.  Cardiovascular: Normal rate, regular rhythm and intact distal pulses.   No murmur heard. Pulmonary/Chest: Effort normal and breath sounds normal. No respiratory distress.  Abdominal: Soft. Bowel sounds are normal. She exhibits no distension. There is tenderness. There is no rebound and no guarding.       Diffuse mild tenderness, with no peritoneal signs  Genitourinary: Guaiac positive stool.       Rectal exam done with RN chaperone  Musculoskeletal: Normal range of motion. She exhibits no edema and no tenderness.  Neurological: She is alert and oriented to person, place, and time. No cranial nerve deficit.  Skin: Skin is warm and dry. No rash noted.  Psychiatric: She has a normal mood and affect. Thought content normal.    ED Course  Procedures (including critical care time) 41 year old, female, with symptoms consistent with gastroenteritis.  No fever, or rash, or recent antibiotic use.  No acute abdomen  Labs Reviewed  BASIC  METABOLIC PANEL  CBC WITH DIFFERENTIAL   No results found.   No diagnosis found.  2:16 PM Spoke with [facility]. She will admit  MDM  Gastroenteritis hematechezia Anemia - increased Hgb dropped from 10.3 to 9.2 mg/dL        Cheri Guppy, MD 03/13/12 1401  Cheri Guppy, MD 03/13/12 1416

## 2012-03-13 NOTE — ED Notes (Signed)
Called floor to give report. Receiving nurse with another pt. Awaiting call back

## 2012-03-13 NOTE — Progress Notes (Signed)
Patient arrived to floor via stretcher in stable condition. Will continue to monitor throughout shift.

## 2012-03-13 NOTE — ED Notes (Signed)
[id number]<AV> Expected date:03/13/12<BR> Expected time:10:45 AM<BR> Means of arrival:Ambulance<BR> Comments:<BR> abd pain

## 2012-03-13 NOTE — ED Notes (Signed)
I place the patients house rob, flip flops in a patient belonging bag. Also inside her room is her home meds in a zip loc bag.

## 2012-03-13 NOTE — Progress Notes (Signed)
Dispo Note:  Called by EDP Dr. Nino Parsley on this patient. She has a h/o IDDM and comes in with n/v and bloody diarrhea. On rectal exam stool was grossly bloody by report. She is hemodynamically stable, not orthostatic, Hb has dropped 1 gram from 10.2 to 9.3 in 2 weeks. No transfusion as of yet. Have given admission orders to a med-surg bed given her stability.  Peggye Pitt, MD [facility] Pager: [phone]

## 2012-03-13 NOTE — Consult Note (Signed)
Referring Provider: Dr. Laural Benes Primary Care Physician:  [name], MD Primary Gastroenterologist:  Dr. Dulce Sellar  Reason for Consultation:  Hematochezia; Diarrhea  HPI: [name] is a 41 y.o. female s/p gastric bypass in January who reports sharp stabbing recurrent pain for the past month with nausea and occasional emesis. Denies chills. Reports low grade fevers. Developed diarrhea one month ago as well that she thinks became bloody 5 days ago. She would see the blood with wiping and also in her stool during the past 4-5 days. Reports 75 pound weight loss since gastric bypass and better control of her sugars, which she says had been running in the 500's. Anemic in ER with Hgb 8. Reports colonoscopy over 20 years ago with results unknown. Reported EGD earlier this year post-bypass by Dr. Dulce Sellar. No evidence of obstruction on Xray but large volume of stool noted in transverse and descending colon.     Past Medical History  Diagnosis Date  . Arthritis   . Asthma   . GERD (gastroesophageal reflux disease)   . Diabetes mellitus   . Hyperlipidemia   . Migraines   . Hypertension   . Back pain, chronic   . Depression   . Neuropathy   . Anxiety   . Nervous breakdown sept 2013  . Fibromyalgia   . History of gastric bypass Jan 2013  . Insomnia     Past Surgical History  Procedure Date  . Back surgery November 1992  . Cesarean section December 1998  . Tubal ligation January 1999  . Cholecystectomy 1998  . Gastric roux-en-y 06/30/2011    Procedure: LAPAROSCOPIC ROUX-EN-Y GASTRIC;  Surgeon: Mariella Saa, MD;  Location: [facility];  Service: General;  Laterality: N/A;     . Esophagogastroduodenoscopy 07/27/2011    Procedure: ESOPHAGOGASTRODUODENOSCOPY (EGD);  Surgeon: Freddy Jaksch, MD;  Location: [facility];  Service: Endoscopy;  Laterality: N/A;    Prior to Admission medications   Medication Sig Start Date End Date Taking? Authorizing Provider  albuterol (PROVENTIL  HFA;VENTOLIN HFA) 108 (90 BASE) MCG/ACT inhaler Inhale 2 puffs into the lungs every 6 (six) hours as needed. WHEEZING    Yes Historical Provider, MD  amitriptyline (ELAVIL) 25 MG tablet Take 1 tablet (25 mg total) by mouth at bedtime. For pain management, depression, and insomnia. 02/19/12 02/18/13 Yes Mike Craze, MD  Biotin 1000 MCG tablet Take 1,000 mcg by mouth daily.   Yes Historical Provider, MD  calcium-vitamin D (OSCAL WITH D) 500-200 MG-UNIT per tablet Take 1 tablet by mouth daily.   Yes Historical Provider, MD  citalopram (CELEXA) 20 MG tablet Take 1 tablet (20 mg total) by mouth daily. For depression. 02/19/12 02/18/13 Yes Mike Craze, MD  dicyclomine (BENTYL) 10 MG capsule Take 1 capsule (10 mg total) by mouth 4 (four) times daily as needed (cramps). For cramps 02/19/12 02/18/13 Yes Mike Craze, MD  DULoxetine (CYMBALTA) 20 MG capsule Take 1 capsule (20 mg total) by mouth 2 (two) times daily. For depression and pain management. 02/19/12 02/18/13 Yes Mike Craze, MD  insulin glargine (LANTUS) 100 UNIT/ML injection Inject 20 Units into the skin at bedtime. For control of blood sugar 02/19/12  Yes Mike Craze, MD  lidocaine (LIDODERM) 5 % Place 1 patch onto the skin daily. Remove & Discard patch within 12 hours for pain management 02/19/12 03/20/12 Yes Mike Craze, MD  lisinopril-hydrochlorothiazide (PRINZIDE,ZESTORETIC) 20-25 MG per tablet Take 1 tablet by mouth daily.   Yes Historical Provider,  MD  meloxicam (MOBIC) 7.5 MG tablet Take 1 tablet (7.5 mg total) by mouth daily. For inflammation 02/19/12 02/18/13 Yes Mike Craze, MD  montelukast (SINGULAIR) 10 MG tablet Take 10 mg by mouth at bedtime.   Yes Historical Provider, MD  pantoprazole (PROTONIX) 40 MG tablet Take 1 tablet (40 mg total) by mouth daily. For control of stomach acid secretion and helps GERD. 02/19/12  Yes Mike Craze, MD  pregabalin (LYRICA) 75 MG capsule Take 1 capsule (75 mg total) by mouth 3 (three) times daily. neuropathic  pain management 02/20/12 02/19/13 Yes Wonda Cerise, MD  traZODone (DESYREL) 50 MG tablet Take 1 tablet (50 mg total) by mouth at bedtime and may repeat dose one time if needed. For insomnia. 02/19/12 03/20/12 Yes Mike Craze, MD  vitamin B-12 (CYANOCOBALAMIN) 1000 MCG tablet Take 1 tablet (1,000 mcg total) by mouth daily. For nutritional supplementation. 02/19/12  Yes Mike Craze, MD    Scheduled Meds:   . sodium chloride   Intravenous STAT  . acetaminophen  650 mg Oral Once  . amitriptyline  25 mg Oral QHS  . calcium-vitamin D  1 tablet Oral Daily  . citalopram  20 mg Oral Daily  . DULoxetine  20 mg Oral BID  . lisinopril  20 mg Oral Daily   And  . hydrochlorothiazide  25 mg Oral Daily  . hyoscyamine  0.25 mg Oral Once  . insulin aspart  0-9 Units Subcutaneous TID WC  . insulin glargine  14 Units Subcutaneous QHS  . lidocaine  1 patch Transdermal Q24H  . montelukast  10 mg Oral QHS  . ondansetron (ZOFRAN) IV  4 mg Intravenous Once  . pantoprazole  40 mg Oral Q1200  . pregabalin  75 mg Oral TID  . traZODone  50 mg Oral QHS,MR X 1  . vitamin B-12  1,000 mcg Oral Daily  . DISCONTD: Biotin  1,000 mcg Oral Daily  . DISCONTD: lisinopril-hydrochlorothiazide  1 tablet Oral Daily   Continuous Infusions:   . sodium chloride 100 mL/hr at 03/13/12 1833   PRN Meds:.albuterol, diclofenac sodium, dicyclomine, ondansetron (ZOFRAN) IV, ondansetron, oxyCODONE-acetaminophen  Allergies as of 03/13/2012 - Review Complete 03/13/2012  Allergen Reaction Noted  . Imitrex (sumatriptan) Nausea Only 02/17/2012    Family History  Problem Relation Age of Onset  . Diabetes Maternal Aunt   . Diabetes Paternal Aunt   . Diabetes Paternal Grandfather     History   Social History  . Marital Status: Single    Spouse Name: N/A    Number of Children: N/A  . Years of Education: N/A   Occupational History  . Not on file.   Social History Main Topics  . Smoking status: Never Smoker   . Smokeless  tobacco: Never Used  . Alcohol Use: No  . Drug Use: No  . Sexually Active: No   Other Topics Concern  . Not on file   Social History Narrative  . No narrative on file    Review of Systems: All negative except as stated above in HPI.  Physical Exam: Vital signs: Filed Vitals:   03/13/12 2140  BP: 134/70  Pulse: 84  Temp: 98 F (36.7 C)  Resp: 18   Last BM Date: 03/13/12 General:   Alert,  Well-developed, well-nourished, pleasant and cooperative in NAD HEENT: anicteric sclera Lungs:  Clear throughout to auscultation.   No wheezes, crackles, or rhonchi. No acute distress. Heart:  Regular rate and rhythm; no murmurs, clicks,  rubs,  or gallops. Abdomen: diffusely tender with guarding, soft, nondistended, +BS  Rectal:  Deferred Ext: no edema  GI:  Lab Results:  Basename 03/13/12 1155  WBC 9.3  HGB 9.2*  HCT 28.6*  PLT 385   BMET  Basename 03/13/12 1155  NA 139  K 3.6  CL 107  CO2 22  GLUCOSE 166*  BUN 15  CREATININE 0.52  CALCIUM 8.6   LFT No results found for this basename: PROT,ALBUMIN,AST,ALT,ALKPHOS,BILITOT,BILIDIR,IBILI in the last 72 hours PT/INR No results found for this basename: LABPROT:2,INR:2 in the last 72 hours   Studies/Results: Acute Abdominal Series  03/13/2012  *RADIOLOGY REPORT*  Clinical Data: Abdominal pain.  History of gastric bypass.  ACUTE ABDOMEN SERIES (ABDOMEN 2 VIEW & CHEST 1 VIEW)  Comparison: Plain films of the abdomen 07/26/2011 and PA and lateral chest 03/31/2011.  Findings: Single view of the chest demonstrates clear lungs.  Heart size is upper normal.  No pneumothorax or pleural fluid.  Two views of the abdomen show no free intraperitoneal air.  There is a large volume of stool in the descending and transverse colon. Gas filled but nondilated loops of small bowel are seen in the right abdomen.  The patient is status post gastric bypass and cholecystectomy.  IMPRESSION:  1.  No acute cardiopulmonary disease. 2.  Gas filled but  nondilated loops of small bowel in the right abdomen are nonspecific and could be due to some inflammatory process.  The appearance is not typical of obstruction.  No free intraperitoneal air is seen. 3.  Large volume of stool transverse and descending colon.   Original Report Authenticated By: Bernadene Bell. Maricela Curet, M.D.     Impression/Plan: 40yo with abdominal pain and diarrhea for a month that became bloody 4-5 days ago. Question infection (less likely if diarrhea has been occurring for a month) vs. Ulcerative colitis vs. Ischemia. Colon malignancy also possible. Await stool studies. Patient likely will need a colonoscopy during this admit unless stool studies revealing and will likely need Diprivan sedation for that colonoscopy. Supportive care. Clear liquids. Will follow.    LOS: 0 days   Gonzalo Waymire C.  03/13/2012, 11:01 PM

## 2012-03-13 NOTE — ED Notes (Signed)
Pt has had diarrhea over one week. Abdominal pain, weakness, and blood in stool (per EMS pt stated that there was bright red blood with her stool but pt is about to start her menstrual cycle so not for sure source of blood) started yesterday per EMS.  Pt went to her PCP during the week and was told she was anemic and was instructed to look for blood in her stool.  Per EMS pt is lethargic and had gastric bypass in Jan 2013 and per EMS family states pt hasn't been healthy since. Per EMS Abd pain 9/10.

## 2012-03-14 DIAGNOSIS — R1084 Generalized abdominal pain: Secondary | ICD-10-CM

## 2012-03-14 LAB — COMPREHENSIVE METABOLIC PANEL
AST: 56 U/L — ABNORMAL HIGH (ref 0–37)
Albumin: 2.5 g/dL — ABNORMAL LOW (ref 3.5–5.2)
Alkaline Phosphatase: 130 U/L — ABNORMAL HIGH (ref 39–117)
BUN: 16 mg/dL (ref 6–23)
Chloride: 109 mEq/L (ref 96–112)
Potassium: 3.7 mEq/L (ref 3.5–5.1)
Total Bilirubin: 0.2 mg/dL — ABNORMAL LOW (ref 0.3–1.2)

## 2012-03-14 LAB — CBC
HCT: 28.8 % — ABNORMAL LOW (ref 36.0–46.0)
Hemoglobin: 9.3 g/dL — ABNORMAL LOW (ref 12.0–15.0)
Hemoglobin: 9.4 g/dL — ABNORMAL LOW (ref 12.0–15.0)
MCH: 25.5 pg — ABNORMAL LOW (ref 26.0–34.0)
MCH: 25.5 pg — ABNORMAL LOW (ref 26.0–34.0)
MCV: 79.1 fL (ref 78.0–100.0)
MCV: 77.6 fL — ABNORMAL LOW (ref 78.0–100.0)
Platelets: 314 10*3/uL (ref 150–400)
RBC: 3.65 MIL/uL — ABNORMAL LOW (ref 3.87–5.11)
RBC: 3.71 MIL/uL — ABNORMAL LOW (ref 3.87–5.11)
RDW: 14.6 % (ref 11.5–15.5)
WBC: 6.1 10*3/uL (ref 4.0–10.5)
WBC: 4.3 10*3/uL (ref 4.0–10.5)

## 2012-03-14 LAB — URINALYSIS, ROUTINE W REFLEX MICROSCOPIC
Nitrite: NEGATIVE
Protein, ur: 30 mg/dL — AB
Urobilinogen, UA: 1 mg/dL (ref 0.0–1.0)

## 2012-03-14 LAB — URINE MICROSCOPIC-ADD ON

## 2012-03-14 LAB — CLOSTRIDIUM DIFFICILE BY PCR: Toxigenic C. Difficile by PCR: NEGATIVE

## 2012-03-14 LAB — GLUCOSE, CAPILLARY: Glucose-Capillary: 124 mg/dL — ABNORMAL HIGH (ref 70–99)

## 2012-03-14 MED ORDER — BISACODYL 5 MG PO TBEC
10.0000 mg | DELAYED_RELEASE_TABLET | Freq: Once | ORAL | Status: AC
  Administered 2012-03-14: 10 mg via ORAL
  Filled 2012-03-14: qty 2

## 2012-03-14 MED ORDER — PEG 3350-KCL-NA BICARB-NACL 420 G PO SOLR
4000.0000 mL | Freq: Once | ORAL | Status: DC
  Filled 2012-03-14: qty 4000

## 2012-03-14 MED ORDER — FLUTICASONE PROPIONATE 50 MCG/ACT NA SUSP
2.0000 | Freq: Every day | NASAL | Status: DC
  Administered 2012-03-14 – 2012-03-17 (×4): 2 via NASAL
  Filled 2012-03-14: qty 16

## 2012-03-14 MED ORDER — PEG 3350-KCL-NA BICARB-NACL 420 G PO SOLR
4000.0000 mL | Freq: Once | ORAL | Status: AC
  Administered 2012-03-14: 4000 mL via ORAL
  Filled 2012-03-14: qty 4000

## 2012-03-14 MED ORDER — LUBIPROSTONE 24 MCG PO CAPS
24.0000 ug | ORAL_CAPSULE | Freq: Two times a day (BID) | ORAL | Status: DC
  Administered 2012-03-14 – 2012-03-17 (×5): 24 ug via ORAL
  Filled 2012-03-14 (×9): qty 1

## 2012-03-14 MED ORDER — PROMETHAZINE HCL 25 MG/ML IJ SOLN
12.5000 mg | Freq: Four times a day (QID) | INTRAMUSCULAR | Status: DC | PRN
  Administered 2012-03-14 – 2012-03-17 (×8): 12.5 mg via INTRAVENOUS
  Filled 2012-03-14 (×8): qty 1

## 2012-03-14 NOTE — Progress Notes (Signed)
Subjective: Generalized abdominal pain. Is still having some red blood in stool with clots.  Objective: Vital signs in last 24 hours: Temp:  [97.9 F (36.6 C)-98.1 F (36.7 C)] 97.9 F (36.6 C) (09/30 0524) Pulse Rate:  [83-94] 83  (09/30 0524) Resp:  [16-19] 18  (09/30 0524) BP: (132-146)/(65-92) 145/90 mmHg (09/30 0959) SpO2:  [96 %-100 %] 99 % (09/30 0524) Weight:  [83.5 kg (184 lb 1.4 oz)] 83.5 kg (184 lb 1.4 oz) (09/29 1545) Weight change:  Last BM Date: 03/13/12  PE: GEN:  NAD ABD:  Mild generalized tenderness and distention, hypoactive bowel sounds, no peritonitis  Lab Results: CBC    Component Value Date/Time   WBC 6.0 03/14/2012 0345   RBC 3.26* 03/14/2012 0345   HGB 8.3* 03/14/2012 0345   HCT 25.8* 03/14/2012 0345   PLT 314 03/14/2012 0345   MCV 79.1 03/14/2012 0345   MCH 25.5* 03/14/2012 0345   MCHC 32.2 03/14/2012 0345   RDW 14.6 03/14/2012 0345   LYMPHSABS 1.7 03/13/2012 1155   MONOABS 0.5 03/13/2012 1155   EOSABS 0.1 03/13/2012 1155   BASOSABS 0.0 03/13/2012 1155   CMP     Component Value Date/Time   NA 139 03/14/2012 0345   K 3.7 03/14/2012 0345   CL 109 03/14/2012 0345   CO2 22 03/14/2012 0345   GLUCOSE 103* 03/14/2012 0345   BUN 16 03/14/2012 0345   CREATININE 0.57 03/14/2012 0345   CREATININE 0.76 03/20/2011 1302   CALCIUM 8.2* 03/14/2012 0345   PROT 5.8* 03/14/2012 0345   ALBUMIN 2.5* 03/14/2012 0345   AST 56* 03/14/2012 0345   ALT 51* 03/14/2012 0345   ALKPHOS 130* 03/14/2012 0345   BILITOT 0.2* 03/14/2012 0345   GFRNONAA >90 03/14/2012 0345   GFRAA >90 03/14/2012 0345   Assessment:  1.  Generalized abdominal pain.  Query constipation-related. 2.  Constipation per recent abdominal xray. 3.  Hematochezia. 4.  Anemia.  Acute on chronic.  Plan:  1.  Bowel preparation today, both to help her abdominal pain and in preparation for colonoscopy tomorrow. 2.  Clear liquid diet today, NPO after midnight. 3.  Anticipated colonoscopy tomorrow afternoon with  anesthesia-administered propofol for sedation. 4.  Will follow.   Freddy Jaksch 03/14/2012, 12:11 PM

## 2012-03-14 NOTE — Progress Notes (Signed)
Initial review for inpatient status is complete. 

## 2012-03-14 NOTE — Evaluation (Signed)
Physical Therapy Evaluation Patient Details Name: [name] MRN: [id number] DOB: 03-25-1971 Today's Date: 03/14/2012 Time: 1100-1145 PT Time Calculation (min): 45 min  PT Assessment / Plan / Recommendation Clinical Impression  41 yo admitted with multple medical problems and history including fibromyalgia admitted with hematochezia.  She still does not feel well today, but agreed to ambulate and exercise.  She does have inconsistent balance and needs to use a RW for stability when she does not feel well. She will benefit from PT while in hospital for strengthening and exercise instruction and will benefit from outpatient PT or community exercise program at [facility].     PT Assessment  Patient needs continued PT services    Follow Up Recommendations  Outpatient PT    Barriers to Discharge        Equipment Recommendations  Rolling walker with 5" wheels    Recommendations for Other Services     Frequency Min 3X/week    Precautions / Restrictions Precautions Precautions: Fall Precaution Comments: Pt unsteady when up on feel alone. Restrictions Weight Bearing Restrictions: No   Pertinent Vitals/Pain Pt c/o nausea and pain"all over"      Mobility  Bed Mobility Bed Mobility: Rolling Right;Rolling Left;Supine to Sit;Sit to Supine Rolling Right: 6: Modified independent (Device/Increase time) Rolling Left: 6: Modified independent (Device/Increase time) Supine to Sit: 6: Modified independent (Device/Increase time) Sit to Supine: 6: Modified independent (Device/Increase time) Details for Bed Mobility Assistance: pt needs extra time for all mobility Transfers Transfers: Sit to Stand;Stand to Sit Sit to Stand: 6: Modified independent (Device/Increase time) Stand to Sit: 6: Modified independent (Device/Increase time) Details for Transfer Assistance:  Pt was able to repeat sit to stand 5 times, but a few times was very shak, others not shaky at all. pt kept eyes  closed Ambulation/Gait Ambulation/Gait Assistance: 4: Min guard Ambulation Distance (Feet): 100 Feet Assistive device: Rolling walker Ambulation/Gait Assistance Details: pt had episodes of shakiness and balance loss.  Appeared to be assisted with use of RW Gait Pattern: Step-through pattern;Decreased step length - right Gait velocity: decreased General Gait Details: pt needed RW and min guard for safety as  performance is inconsistent Stairs: No Wheelchair Mobility Wheelchair Mobility: No    Shoulder Instructions     Exercises General Exercises - Lower Extremity Gluteal Sets: AROM;Both;Other (comment) (3 reps, bridging) Hip ABduction/ADduction: AROM;Sidelying;Other reps (comment);Other (comment) (pt could only repeat 3 times) Hip Flexion/Marching: AROM;5 reps;Supine;Seated Other Exercises Other Exercises: bilateral hip to hip in sitting unsupported to activate core   PT Diagnosis: Difficulty walking;Abnormality of gait;Generalized weakness  PT Problem List: Decreased activity tolerance;Decreased balance;Decreased mobility PT Treatment Interventions: DME instruction;Gait training;Stair training;Therapeutic exercise   PT Goals Acute Rehab PT Goals PT Goal Formulation: With patient Time For Goal Achievement: 03/21/12 Potential to Achieve Goals: Good Pt will go Sit to Supine/Side: Independently PT Goal: Sit to Supine/Side - Progress: Goal set today Pt will go Stand to Sit: Independently PT Goal: Stand to Sit - Progress: Goal set today Pt will Ambulate: >150 feet;with modified independence;with least restrictive assistive device PT Goal: Ambulate - Progress: Goal set today Pt will Go Up / Down Stairs: 6-9 stairs;with supervision PT Goal: Up/Down Stairs - Progress: Goal set today Pt will Perform Home Exercise Program: Independently PT Goal: Perform Home Exercise Program - Progress: Goal set today  Visit Information  Last PT Received On: 03/14/12 Assistance Needed: +1     Subjective Data  Subjective: I feel nauseous Patient Stated Goal: none stated.  Pt said she will be in the hospital 3-4 more days   Prior Functioning  Home Living Lives With: Family;Other (Comment) (Parents and 35 year old son.) Available Help at Discharge: Family;Available 24 hours/day Type of Home: House Home Access: Stairs to enter [employer] of Steps: 6 Entrance Stairs-Rails: Right Home Layout: One level Bathroom Shower/Tub: Forensic scientist: Standard Home Adaptive Equipment: None Prior Function Level of Independence: Independent (when she feels well. 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  On the days she feels bad, she states she cannot do a thing. Communication Communication: No difficulties Dominant Hand: Right    Cognition  Overall Cognitive Status: Appears within functional limits for tasks assessed/performed Arousal/Alertness: Awake/alert Orientation Level: Oriented X4 / Intact Behavior During Session: Other (comment) (pt mildly lethargic unless talking about things of interest.) Cognition - Other Comments: pt agreed to PT, then had me wait ~10 minutes while she texted her son.  When I offerred to leave several times she said "wait a minute"  Pt did not want me to leave ,but took excessive time to participate with PT    Extremity/Trunk Assessment Right Upper Extremity Assessment RUE ROM/Strength/Tone: Within functional levels RUE Sensation: WFL - Light Touch RUE Coordination: WFL - gross/fine motor Left Upper Extremity Assessment LUE ROM/Strength/Tone: Within functional levels LUE Sensation: WFL - Light Touch LUE Coordination: WFL - gross/fine motor Right Lower Extremity Assessment RLE ROM/Strength/Tone: Within functional levels RLE Sensation:  WFL - Light Touch;WFL - Proprioception RLE Coordination: WFL - gross/fine motor Left Lower Extremity Assessment LLE ROM/Strength/Tone: Within functional levels LLE Sensation: WFL - Light Touch;WFL - Proprioception LLE Coordination: WFL - gross/fine motor (inconsistent coordination performance. at times very shaky,) Trunk Assessment Trunk Assessment: Normal   Balance Balance Balance Assessed: Yes Static Sitting Balance Static Sitting - Balance Support: No upper extremity supported Static Sitting - Level of Assistance: 7: Independent Static Sitting - Comment/# of Minutes: 10 Static Standing Balance Static Standing - Balance Support: Bilateral upper extremity supported;During functional activity Static Standing - Level of Assistance: 5: Stand by assistance Static Standing - Comment/# of Minutes: 5 [place] - Eyes Closed: 1  (pt keeps eyes closed, needs RW for stability)  End of Session PT - End of Session Activity Tolerance: Patient limited by pain Patient left: with nursing in room;Other (comment) (pt in bathroon, nauseated) Nurse Communication: Other (comment) (iv beeping, pt nauseated)  GP    Bayard Hugger. [place], [place] [phone] 03/14/2012, 12:15 PM

## 2012-03-14 NOTE — Progress Notes (Signed)
[facility] Progress Note  03/14/2012   Subjective: Pt is having significant abdominal bloating.  She is reporting sinus pressure.    Objective:  Vital signs in last 24 hours: Filed Vitals:   03/14/12 0524 03/14/12 0935 03/14/12 0959 03/14/12 1306  BP: 139/74 145/90 145/90 144/87  Pulse: 83   86  Temp: 97.9 F (36.6 C)   97.7 F (36.5 C)  TempSrc: Oral   Oral  Resp: 18   18  Height:      Weight:      SpO2: 99%   100%   Weight change:   Intake/Output Summary (Last 24 hours) at 03/14/12 1624 Last data filed at 03/14/12 1609  Gross per 24 hour  Intake   1385 ml  Output   1900 ml  Net   -515 ml   Lab Results  Component Value Date   HGBA1C 7.9* 03/13/2012   HGBA1C 8.4* 02/18/2012   HGBA1C 12.3* 03/20/2011   Lab Results  Component Value Date   LDLCALC 117* 03/20/2011   CREATININE 0.57 03/14/2012    Review of Systems As above, otherwise all reviewed and reported negative  Physical Exam General - awake, no distress, cooperative HEENT - NCAT, MMM swollen nasal turbinates Lungs - BBS, CTA CV - normal s1, s2 sounds Abd - Soft, generalized tenderness with palpation, no guarding, no rebound tenderness, no masses palpated, hyperactive bowel sounds noted, suprapubic tenderness noted Ext - no C/C/E  Lab Results: Results for orders placed during the hospital encounter of 03/13/12 (from the past 24 hour(s))  CLOSTRIDIUM DIFFICILE BY PCR     Status: Normal   Collection Time   03/13/12  4:26 PM      Component Value Range   C difficile by pcr NEGATIVE  NEGATIVE  MAGNESIUM     Status: Normal   Collection Time   03/13/12  5:01 PM      Component Value Range   Magnesium 2.1  1.5 - 2.5 mg/dL  PHOSPHORUS     Status: Normal   Collection Time   03/13/12  5:01 PM      Component Value Range   Phosphorus 3.5  2.3 - 4.6 mg/dL  TSH     Status: Normal   Collection Time   03/13/12  5:01 PM      Component Value Range   TSH 0.553  0.350 - 4.500 uIU/mL  VITAMIN B12     Status:  Abnormal   Collection Time   03/13/12  5:01 PM      Component Value Range   Vitamin B-12 >2000 (*) 211 - 911 pg/mL  VITAMIN D 25 HYDROXY     Status: Abnormal   Collection Time   03/13/12  5:01 PM      Component Value Range   Vit D, 25-Hydroxy 13 (*) 30 - 89 ng/mL  FOLATE RBC     Status: Abnormal   Collection Time   03/13/12  5:01 PM      Component Value Range   RBC Folate 848 (*) >=366 ng/mL  PREALBUMIN     Status: Normal   Collection Time   03/13/12  5:01 PM      Component Value Range   Prealbumin 19.0  17.0 - 34.0 mg/dL  HEMOGLOBIN J1B     Status: Abnormal   Collection Time   03/13/12  5:01 PM      Component Value Range   Hemoglobin A1C 7.9 (*) <5.7 %   Mean Plasma Glucose 180 (*) <117  mg/dL  IRON AND TIBC     Status: Abnormal   Collection Time   03/13/12  5:01 PM      Component Value Range   Iron 17 (*) 42 - 135 ug/dL   TIBC 409  811 - 914 ug/dL   Saturation Ratios 4 (*) 20 - 55 %   UIBC 384  125 - 400 ug/dL  TYPE AND SCREEN     Status: Normal   Collection Time   03/13/12  5:01 PM      Component Value Range   ABO/RH(D) A POS     Antibody Screen NEG     Sample Expiration 03/16/2012    ABO/RH     Status: Normal   Collection Time   03/13/12  5:30 PM      Component Value Range   ABO/RH(D) A POS    GLUCOSE, CAPILLARY     Status: Abnormal   Collection Time   03/13/12 10:13 PM      Component Value Range   Glucose-Capillary 131 (*) 70 - 99 mg/dL  COMPREHENSIVE METABOLIC PANEL     Status: Abnormal   Collection Time   03/14/12  3:45 AM      Component Value Range   Sodium 139  135 - 145 mEq/L   Potassium 3.7  3.5 - 5.1 mEq/L   Chloride 109  96 - 112 mEq/L   CO2 22  19 - 32 mEq/L   Glucose, Bld 103 (*) 70 - 99 mg/dL   BUN 16  6 - 23 mg/dL   Creatinine, Ser 7.82  0.50 - 1.10 mg/dL   Calcium 8.2 (*) 8.4 - 10.5 mg/dL   Total Protein 5.8 (*) 6.0 - 8.3 g/dL   Albumin 2.5 (*) 3.5 - 5.2 g/dL   AST 56 (*) 0 - 37 U/L   ALT 51 (*) 0 - 35 U/L   Alkaline Phosphatase 130 (*) 39 -  117 U/L   Total Bilirubin 0.2 (*) 0.3 - 1.2 mg/dL   GFR calc non Af Amer >90  >90 mL/min   GFR calc Af Amer >90  >90 mL/min  CBC     Status: Abnormal   Collection Time   03/14/12  3:45 AM      Component Value Range   WBC 6.0  4.0 - 10.5 K/uL   RBC 3.26 (*) 3.87 - 5.11 MIL/uL   Hemoglobin 8.3 (*) 12.0 - 15.0 g/dL   HCT 95.6 (*) 21.3 - 08.6 %   MCV 79.1  78.0 - 100.0 fL   MCH 25.5 (*) 26.0 - 34.0 pg   MCHC 32.2  30.0 - 36.0 g/dL   RDW 57.8  46.9 - 62.9 %   Platelets 314  150 - 400 K/uL  PROTIME-INR     Status: Normal   Collection Time   03/14/12  3:45 AM      Component Value Range   Prothrombin Time 12.5  11.6 - 15.2 seconds   INR 0.94  0.00 - 1.49  URINALYSIS, ROUTINE W REFLEX MICROSCOPIC     Status: Abnormal   Collection Time   03/14/12  8:09 AM      Component Value Range   Color, Urine YELLOW  YELLOW   APPearance CLOUDY (*) CLEAR   Specific Gravity, Urine 1.026  1.005 - 1.030   pH 6.0  5.0 - 8.0   Glucose, UA NEGATIVE  NEGATIVE mg/dL   Hgb urine dipstick LARGE (*) NEGATIVE   Bilirubin Urine SMALL (*) NEGATIVE  Ketones, ur NEGATIVE  NEGATIVE mg/dL   Protein, ur 30 (*) NEGATIVE mg/dL   Urobilinogen, UA 1.0  0.0 - 1.0 mg/dL   Nitrite NEGATIVE  NEGATIVE   Leukocytes, UA TRACE (*) NEGATIVE  URINE MICROSCOPIC-ADD ON     Status: Abnormal   Collection Time   03/14/12  8:09 AM      Component Value Range   Squamous Epithelial / LPF RARE  RARE   WBC, UA 3-6  <3 WBC/hpf   RBC / HPF TOO NUMEROUS TO COUNT  <3 RBC/hpf   Bacteria, UA FEW (*) RARE  GLUCOSE, CAPILLARY     Status: Abnormal   Collection Time   03/14/12  9:25 AM      Component Value Range   Glucose-Capillary 130 (*) 70 - 99 mg/dL  GLUCOSE, CAPILLARY     Status: Abnormal   Collection Time   03/14/12 11:52 AM      Component Value Range   Glucose-Capillary 124 (*) 70 - 99 mg/dL  CBC     Status: Abnormal   Collection Time   03/14/12  2:14 PM      Component Value Range   WBC 6.1  4.0 - 10.5 K/uL   RBC 3.65 (*) 3.87 -  5.11 MIL/uL   Hemoglobin 9.3 (*) 12.0 - 15.0 g/dL   HCT 40.9 (*) 81.1 - 91.4 %   MCV 78.6  78.0 - 100.0 fL   MCH 25.5 (*) 26.0 - 34.0 pg   MCHC 32.4  30.0 - 36.0 g/dL   RDW 78.2  95.6 - 21.3 %   Platelets 366  150 - 400 K/uL  GLUCOSE, CAPILLARY     Status: Abnormal   Collection Time   03/14/12  3:36 PM      Component Value Range   Glucose-Capillary 115 (*) 70 - 99 mg/dL    Micro Results: Recent Results (from the past 240 hour(s))  CLOSTRIDIUM DIFFICILE BY PCR     Status: Normal   Collection Time   03/13/12  4:26 PM      Component Value Range Status Comment   C difficile by pcr NEGATIVE  NEGATIVE Final     Medications:  Scheduled Meds:   . sodium chloride   Intravenous STAT  . amitriptyline  25 mg Oral QHS  . bisacodyl  10 mg Oral Once  . calcium-vitamin D  1 tablet Oral Daily  . citalopram  20 mg Oral Daily  . DULoxetine  20 mg Oral BID  . fluticasone  2 spray Each Nare Daily  . lisinopril  20 mg Oral Daily   And  . hydrochlorothiazide  25 mg Oral Daily  . insulin aspart  0-9 Units Subcutaneous TID WC  . insulin glargine  14 Units Subcutaneous QHS  . lidocaine  1 patch Transdermal Q24H  . lubiprostone  24 mcg Oral BID WC  . montelukast  10 mg Oral QHS  . pantoprazole  40 mg Oral Q1200  . polyethylene glycol-electrolytes  4,000 mL Oral Once  . polyethylene glycol-electrolytes  4,000 mL Oral Once  . pregabalin  75 mg Oral TID  . traZODone  50 mg Oral QHS,MR X 1  . vitamin B-12  1,000 mcg Oral Daily  . DISCONTD: Biotin  1,000 mcg Oral Daily  . DISCONTD: lisinopril-hydrochlorothiazide  1 tablet Oral Daily   Continuous Infusions:   . sodium chloride 50 mL/hr at 03/14/12 1315   PRN Meds:.albuterol, diclofenac sodium, dicyclomine, ondansetron (ZOFRAN) IV, ondansetron, oxyCODONE-acetaminophen, promethazine  Assessment/Plan: Hematochezia  -  pt still having bloody stools - GI to perform colonoscopy tomorrow - stool cultures (studies) pending  Generalized Abdominal  Pain -  Pt is significantly constipated  - pt is currently being prepped for colonoscopy  Constipation with nausea and vomiting - as above  Acute on Chronic Anemia - following CBC every 8 hours  Diabetes Mellitus type 2 - monitoring BS closely  Chronic Back Pain from Degenerated discs from morbid obesity  Sinusitis - flonase nasal spray  S/P Gastric Bypass Jan 2013  Vit D Deficiency - start replacing orally when can take p.o.    LOS: 1 day   Axcel Horsch 03/14/2012, 4:24 PM  Cleora Fleet, MD, CDE, FAAFP [facility] [place], [place]  [phone]

## 2012-03-14 NOTE — Progress Notes (Signed)
Pt found to have large ziploc bag of prescribed medications in room. RN notified pt of hospital policy for no medications in the room. Agreed to turn over medications and have stored in pharmacy. Paperwork completed and delivered to pharmacy. Medications sheet put in pts chart.

## 2012-03-14 NOTE — Evaluation (Signed)
Occupational Therapy Evaluation Patient Details Name: [name] MRN: [id number] DOB: May 28, 1971 Today's Date: 03/14/2012 Time: 4098-1191 OT Time Calculation (min): 23 min  OT Assessment / Plan / Recommendation Clinical Impression  Pt admitted for low Hgb and possible GI bleed with deficits listed below.  Pt would beneft from short run of OT to attempt to get pt from min guard level to mod I level of independence with all adls.    OT Assessment  Patient needs continued OT Services    Follow Up Recommendations  No OT follow up    Barriers to Discharge None    Equipment Recommendations  None recommended by OT    Recommendations for Other Services PT consult  Frequency  Min 2X/week    Precautions / Restrictions Precautions Precautions: Fall Precaution Comments: Pt unsteady when up on feel alone. Restrictions Weight Bearing Restrictions: No   Pertinent Vitals/Pain Pt c/o 10/10 pain in head, back and "really all over."    ADL  Eating/Feeding: Performed;Independent Where Assessed - Eating/Feeding: Chair Grooming: Performed;Wash/dry hands;Wash/dry face;Teeth care;Min guard Where Assessed - Grooming: Supported standing Upper Body Bathing: Simulated;Set up Where Assessed - Upper Body Bathing: Unsupported sitting Lower Body Bathing: Simulated;Min guard Where Assessed - Lower Body Bathing: Supported sit to stand Upper Body Dressing: Performed;Set up Where Assessed - Upper Body Dressing: Unsupported sitting Lower Body Dressing: Performed;Min guard Where Assessed - Lower Body Dressing: Unsupported sit to stand Toilet Transfer: Performed;Minimal assistance Toilet Transfer Method: Sit to stand;Stand pivot Toilet Transfer Equipment: Comfort height toilet;Grab bars Toileting - Clothing Manipulation and Hygiene: Performed;Min guard Where Assessed - Toileting Clothing Manipulation and Hygiene: Standing Transfers/Ambulation Related to ADLs: Pt walked to bathroom with HHA.  Pt  with 2 very deliberate LOB needing assist to recover.  Pt required cues to walk with her eyes open and tend to where she was walking. ADL Comments: Pt overall needs min guard to min assist only when on her feet b/c of her decreased balance when standing.  Again, the losses of balance were very slow and deliberate.    OT Diagnosis: Generalized weakness;Acute pain  OT Problem List: Impaired balance (sitting and/or standing);Decreased knowledge of use of DME or AE;Pain OT Treatment Interventions: Self-care/ADL training;DME and/or AE instruction;Therapeutic activities   OT Goals Acute Rehab OT Goals OT Goal Formulation: With patient Time For Goal Achievement: 03/28/12 Potential to Achieve Goals: Good ADL Goals Pt Will Perform Grooming: with modified independence;Sitting at sink ADL Goal: Grooming - Progress: Goal set today Pt Will Perform Lower Body Bathing: Sit to stand from chair;Sit to stand in shower;with supervision ADL Goal: Lower Body Bathing - Progress: Goal set today Pt Will Perform Lower Body Dressing: with modified independence;Sit to stand from chair;Other (comment) (gathering own clothes from closet.) ADL Goal: Lower Body Dressing - Progress: Goal set today Pt Will Perform Tub/Shower Transfer: Tub transfer;with supervision ADL Goal: Tub/Shower Transfer - Progress: Goal set today Additional ADL Goal #1: Pt will complete all aspects of toileting on regular commode with mod I if assistive device needed for ambulation. ADL Goal: Additional Goal #1 - Progress: Goal set today  Visit Information  Last OT Received On: 03/14/12 Assistance Needed: +1    Subjective Data  Subjective: "I just need to feel better."  "I always hurt." Patient Stated Goal: to be well.   Prior Functioning     Home Living Lives With: Family;Other (Comment) (Parents and 13 year old son.) Available Help at Discharge: Family;Available 24 hours/day Type of Home: House Home Access:  Stairs to enter [employer] of Steps: 6 Entrance Stairs-Rails: Right Home Layout: One level Bathroom Shower/Tub: Forensic scientist: Standard Home Adaptive Equipment: None Prior Function Level of Independence: Independent (when she feels well. 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  On the days she feels bad, she states she cannot do a thing. Communication Communication: No difficulties Dominant Hand: Right         Vision/Perception Vision - Assessment Vision Assessment: Vision not tested   Cognition  Overall Cognitive Status: Appears within functional limits for tasks assessed/performed Arousal/Alertness: Awake/alert Orientation Level: Oriented X4 / Intact Behavior During Session: Other (comment) (pt mildly lethargic unless talking about things of interest.) Cognition - Other Comments: Feel pt is intact.  Pt was slow to answer all questions but was slow in general with all mobiltiy as well.  Pt walked with eyes closed at times but it appeared more "behavioral" than  cognitive.  Pt states this is how she functions b/c she is always in pain from all of her medical issues.    Extremity/Trunk Assessment Right Upper Extremity Assessment RUE ROM/Strength/Tone: Within functional levels RUE Sensation: WFL - Light Touch RUE Coordination: WFL - gross/fine motor Left Upper Extremity Assessment LUE ROM/Strength/Tone: Within functional levels LUE Sensation: WFL - Light Touch LUE Coordination: WFL - gross/fine motor Trunk Assessment Trunk Assessment: Normal     Mobility Transfers Transfers: Sit to Stand;Stand to Sit Sit to Stand: 4: Min guard;From chair/3-in-1 Stand to Sit: 4: Min guard;To chair/3-in-1 Details for Transfer Assistance: Pt needed hands on assist b/c of loss of balance when up.  Pt  walked very slowly and kept reaching for things to hold to.  Pt would lose balance just standing still but when pt got up to stand at sink to do nails, no LOB seen.  LOB only noted on "evaluation."     Shoulder Instructions     Exercise     Balance Balance Balance Assessed: No   End of Session OT - End of Session Activity Tolerance: Patient tolerated treatment well Patient left: in chair;with call bell/phone within reach Nurse Communication: Mobility status  GO     Hope Budds 03/14/2012, 10:15 AM [phone]

## 2012-03-14 NOTE — Progress Notes (Signed)
INITIAL ADULT NUTRITION ASSESSMENT Date: 03/14/2012   Time: 2:07 PM Reason for Assessment: Consult  INTERVENTION: Post-op gastric bypass education discussed. Anti-emetics per MD. Diet advancement per MD. Recommend outpatient bariatric RD follow up if pt can afford it - perhaps social work consult could help pt with finances. Will monitor.   ASSESSMENT: Female 41 y.o.  Dx: Hematochezia  Food/Nutrition Related Hx: Pt with history of roux-en-y gastric bypass in January of this year. Pt with 70 pound intentional weight loss since then. Pt was followed by outpatient bariatric registered dietitian until 2 months post-op, then pt stated she stopped going because she could not afford the visits. Pt states she has been taking her specific gastric bypass multivitamins daily. Pt reports for the past 2 months she has had nausea with on/off vomiting, typically 2-3 times/week with diarrhea, up to 3-4 times/day. Pt reports her emesis is sometimes clear, sometimes appears like the food she ate earlier in the day. Pt reports her diet is low carbohydrate, typically 1 meal/day of non-starchy vegetables with high protein snacks such as eggs and peanut butter. Pt had episode of emesis this morning. Noted MD concerned about nutritional deficiencies and ordered levels of vitamin D, magnesium, phosphorus, vitamin B 12, and folate. Noted pt's magnesium and phosphorus WNL and vitamin B-12 elevated, awaiting further lab results. Noted pt with elevated HbA1c of 7.9% which is improved from 8.4% on 02/18/12 and 12.3% on 03/20/11. Pt negative for C. Difficile.   Hx:  Past Medical History  Diagnosis Date  . Arthritis   . Asthma   . GERD (gastroesophageal reflux disease)   . Diabetes mellitus   . Hyperlipidemia   . Migraines   . Hypertension   . Back pain, chronic   . Depression   . Neuropathy   . Anxiety   . Nervous breakdown sept 2013  . Fibromyalgia   . History of gastric bypass Jan 2013  . Insomnia    Related  Meds: Scheduled Meds:   . sodium chloride   Intravenous STAT  . acetaminophen  650 mg Oral Once  . amitriptyline  25 mg Oral QHS  . bisacodyl  10 mg Oral Once  . calcium-vitamin D  1 tablet Oral Daily  . citalopram  20 mg Oral Daily  . DULoxetine  20 mg Oral BID  . fluticasone  2 spray Each Nare Daily  . lisinopril  20 mg Oral Daily   And  . hydrochlorothiazide  25 mg Oral Daily  . insulin aspart  0-9 Units Subcutaneous TID WC  . insulin glargine  14 Units Subcutaneous QHS  . lidocaine  1 patch Transdermal Q24H  . lubiprostone  24 mcg Oral BID WC  . montelukast  10 mg Oral QHS  . pantoprazole  40 mg Oral Q1200  . polyethylene glycol-electrolytes  4,000 mL Oral Once  . polyethylene glycol-electrolytes  4,000 mL Oral Once  . pregabalin  75 mg Oral TID  . traZODone  50 mg Oral QHS,MR X 1  . vitamin B-12  1,000 mcg Oral Daily  . DISCONTD: Biotin  1,000 mcg Oral Daily  . DISCONTD: lisinopril-hydrochlorothiazide  1 tablet Oral Daily   Continuous Infusions:   . sodium chloride 50 mL/hr at 03/14/12 1315   PRN Meds:.albuterol, diclofenac sodium, dicyclomine, ondansetron (ZOFRAN) IV, ondansetron, oxyCODONE-acetaminophen, promethazine  Ht: 5' 5.5" (166.4 cm)  Wt: 184 lb 1.4 oz (83.5 kg)  Ideal Wt: 125 lb % Ideal Wt: 147  Usual Wt: 254 lb before gastric bypass per pt  report % Usual Wt: 72  Body mass index is 30.17 kg/(m^2). Class I obesity   Labs:  CMP     Component Value Date/Time   NA 139 03/14/2012 0345   K 3.7 03/14/2012 0345   CL 109 03/14/2012 0345   CO2 22 03/14/2012 0345   GLUCOSE 103* 03/14/2012 0345   BUN 16 03/14/2012 0345   CREATININE 0.57 03/14/2012 0345   CREATININE 0.76 03/20/2011 1302   CALCIUM 8.2* 03/14/2012 0345   PROT 5.8* 03/14/2012 0345   ALBUMIN 2.5* 03/14/2012 0345   AST 56* 03/14/2012 0345   ALT 51* 03/14/2012 0345   ALKPHOS 130* 03/14/2012 0345   BILITOT 0.2* 03/14/2012 0345   GFRNONAA >90 03/14/2012 0345   GFRAA >90 03/14/2012 0345   Lab Results   Component Value Date   HGBA1C 7.9* 03/13/2012   CBG (last 3)   Basename 03/14/12 1152 03/14/12 0925 03/13/12 2213  GLUCAP 124* 130* 131*    Intake/Output Summary (Last 24 hours) at 03/14/12 1421 Last data filed at 03/14/12 1102  Gross per 24 hour  Intake   1145 ml  Output   1400 ml  Net   -255 ml   Last BM - 9/29  Diet Order: Clear Liquid   IVF:    sodium chloride Last Rate: 50 mL/hr at 03/14/12 1315    Estimated Nutritional Needs:   Kcal:1550-1850 Protein:60-80g Fluid:1.5-1.8L  NUTRITION DIAGNOSIS: -Inadequate oral intake (NI-2.1).  Status: Ongoing  RELATED TO: nausea/vomiting  AS EVIDENCE BY: pt statement, clear liquid diet  MONITORING/EVALUATION(Goals): 1. Resolution of nausea/vomiting 2. Resolution of diarrhea 3. Advance diet as tolerated to diabetic diet.   EDUCATION NEEDS: -Education needs addressed - discussed post-op gastric bypass diet and provided handout of this information. Teach back method used. Provided contact information of outpatient bariatric RD who stated pt could call her with questions even if she could not make visits.    Dietitian #: [phone]  DOCUMENTATION CODES Per approved criteria  -Obesity Unspecified    Marshall Cork 03/14/2012, 2:07 PM

## 2012-03-15 ENCOUNTER — Inpatient Hospital Stay (HOSPITAL_COMMUNITY): Payer: Medicare HMO | Admitting: Anesthesiology

## 2012-03-15 ENCOUNTER — Encounter (HOSPITAL_COMMUNITY)
Admission: EM | Disposition: A | Discharge status: Home / Self Care | Payer: Self-pay | Source: Home / Self Care | Attending: Family Medicine

## 2012-03-15 ENCOUNTER — Encounter (HOSPITAL_COMMUNITY): Payer: Self-pay | Admitting: *Deleted

## 2012-03-15 ENCOUNTER — Encounter (HOSPITAL_COMMUNITY): Payer: Self-pay | Admitting: Anesthesiology

## 2012-03-15 DIAGNOSIS — R112 Nausea with vomiting, unspecified: Secondary | ICD-10-CM

## 2012-03-15 LAB — GLUCOSE, CAPILLARY
Glucose-Capillary: 96 mg/dL (ref 70–99)
Glucose-Capillary: 122 mg/dL — ABNORMAL HIGH (ref 70–99)

## 2012-03-15 LAB — CBC
HCT: 30.2 % — ABNORMAL LOW (ref 36.0–46.0)
HCT: 28.9 % — ABNORMAL LOW (ref 36.0–46.0)
Hemoglobin: 9.9 g/dL — ABNORMAL LOW (ref 12.0–15.0)
Hemoglobin: 9.4 g/dL — ABNORMAL LOW (ref 12.0–15.0)
Hemoglobin: 9.3 g/dL — ABNORMAL LOW (ref 12.0–15.0)
MCH: 25.5 pg — ABNORMAL LOW (ref 26.0–34.0)
MCHC: 32.2 g/dL (ref 30.0–36.0)
MCV: 77.8 fL — ABNORMAL LOW (ref 78.0–100.0)
MCV: 78.3 fL (ref 78.0–100.0)
Platelets: 359 10*3/uL (ref 150–400)
RBC: 3.88 MIL/uL (ref 3.87–5.11)
RBC: 3.69 MIL/uL — ABNORMAL LOW (ref 3.87–5.11)
RBC: 3.7 MIL/uL — ABNORMAL LOW (ref 3.87–5.11)
RDW: 14.6 % (ref 11.5–15.5)
WBC: 9.5 10*3/uL (ref 4.0–10.5)

## 2012-03-15 SURGERY — COLONOSCOPY WITH PROPOFOL
Anesthesia: Monitor Anesthesia Care | Laterality: Left

## 2012-03-15 SURGERY — COLONOSCOPY WITH PROPOFOL
Anesthesia: Monitor Anesthesia Care

## 2012-03-15 MED ORDER — HYDRALAZINE HCL 20 MG/ML IJ SOLN
10.0000 mg | Freq: Four times a day (QID) | INTRAMUSCULAR | Status: DC | PRN
  Administered 2012-03-15: 10 mg via INTRAVENOUS
  Filled 2012-03-15: qty 1

## 2012-03-15 MED ORDER — MORPHINE SULFATE 2 MG/ML IJ SOLN
1.0000 mg | INTRAMUSCULAR | Status: DC | PRN
  Administered 2012-03-15 – 2012-03-16 (×5): 1 mg via INTRAVENOUS
  Filled 2012-03-15 (×5): qty 1

## 2012-03-15 MED ORDER — KETAMINE HCL 10 MG/ML IJ SOLN
INTRAMUSCULAR | Status: DC | PRN
  Administered 2012-03-15: 40 mg via INTRAVENOUS

## 2012-03-15 MED ORDER — FERROUS SULFATE 325 (65 FE) MG PO TABS
325.0000 mg | ORAL_TABLET | Freq: Every day | ORAL | Status: DC
  Administered 2012-03-16 – 2012-03-17 (×2): 325 mg via ORAL
  Filled 2012-03-15 (×4): qty 1

## 2012-03-15 MED ORDER — HYDROCORTISONE ACETATE 25 MG RE SUPP
25.0000 mg | Freq: Two times a day (BID) | RECTAL | Status: DC
  Administered 2012-03-15 – 2012-03-17 (×4): 25 mg via RECTAL
  Filled 2012-03-15 (×7): qty 1

## 2012-03-15 MED ORDER — AMOXICILLIN-POT CLAVULANATE 875-125 MG PO TABS
1.0000 | ORAL_TABLET | Freq: Two times a day (BID) | ORAL | Status: DC
  Administered 2012-03-15 – 2012-03-17 (×4): 1 via ORAL
  Filled 2012-03-15 (×6): qty 1

## 2012-03-15 MED ORDER — LACTATED RINGERS IV SOLN
INTRAVENOUS | Status: DC | PRN
  Administered 2012-03-15: 14:00:00 via INTRAVENOUS

## 2012-03-15 MED ORDER — FENTANYL CITRATE 0.05 MG/ML IJ SOLN
INTRAMUSCULAR | Status: DC | PRN
  Administered 2012-03-15 (×2): 50 ug via INTRAVENOUS

## 2012-03-15 MED ORDER — PROPOFOL 10 MG/ML IV EMUL
INTRAVENOUS | Status: DC | PRN
  Administered 2012-03-15: 140 ug/kg/min via INTRAVENOUS

## 2012-03-15 MED ORDER — MIDAZOLAM HCL 5 MG/5ML IJ SOLN
INTRAMUSCULAR | Status: DC | PRN
  Administered 2012-03-15 (×2): 1 mg via INTRAVENOUS

## 2012-03-15 MED ORDER — VITAMIN D (ERGOCALCIFEROL) 1.25 MG (50000 UNIT) PO CAPS
50000.0000 [IU] | ORAL_CAPSULE | ORAL | Status: DC
  Administered 2012-03-16: 50000 [IU] via ORAL
  Filled 2012-03-15: qty 1

## 2012-03-15 MED ORDER — SODIUM CHLORIDE 0.9 % IV SOLN: INTRAVENOUS | Status: DC

## 2012-03-15 MED ORDER — AMOXICILLIN 500 MG PO CAPS: 1000.0000 mg | ORAL_CAPSULE | Freq: Two times a day (BID) | ORAL | Status: DC

## 2012-03-15 MED ORDER — SACCHAROMYCES BOULARDII 250 MG PO CAPS
250.0000 mg | ORAL_CAPSULE | Freq: Two times a day (BID) | ORAL | Status: DC
  Administered 2012-03-15 – 2012-03-17 (×4): 250 mg via ORAL
  Filled 2012-03-15 (×5): qty 1

## 2012-03-15 SURGICAL SUPPLY — 21 items

## 2012-03-15 NOTE — Anesthesia Preprocedure Evaluation (Addendum)
Anesthesia Evaluation  Patient identified by MRN, date of birth, ID band Patient awake    Reviewed: Allergy & Precautions, H&P , NPO status , Patient's Chart, lab work & pertinent test results  Airway Mallampati: II TM Distance: >3 FB Neck ROM: Full    Dental  (+) Dental Advisory Given   Pulmonary asthma ,  breath sounds clear to auscultation  Pulmonary exam normal       Cardiovascular hypertension, Pt. on medications Rhythm:Regular Rate:Normal     Neuro/Psych  Headaches, PSYCHIATRIC DISORDERS Anxiety Depression  Neuromuscular disease    GI/Hepatic GERD-  ,  Endo/Other  diabetes, Type 2, Insulin Dependent  Renal/GU      Musculoskeletal  (+) Fibromyalgia -  Abdominal   Peds  Hematology   Anesthesia Other Findings   Reproductive/Obstetrics                          Anesthesia Physical Anesthesia Plan  ASA: III  Anesthesia Plan: MAC   Post-op Pain Management:    Induction: Intravenous  Airway Management Planned:   Additional Equipment:   Intra-op Plan:   Post-operative Plan:   Informed Consent: I have reviewed the patients History and Physical, chart, labs and discussed the procedure including the risks, benefits and alternatives for the proposed anesthesia with the patient or authorized representative who has indicated his/her understanding and acceptance.   Dental advisory given  Plan Discussed with: CRNA and Surgeon  Anesthesia Plan Comments:         Anesthesia Quick Evaluation

## 2012-03-15 NOTE — Op Note (Signed)
[facility] [address] [place], [address]   COLONOSCOPY PROCEDURE REPORT  PATIENT: [name], [name].  MR#: [id number] BIRTHDATE: August 06, 1970 , 40  yrs. old GENDER: Female ENDOSCOPIST: Willis Modena, MD REFERRED [id number] Laural Benes, M.D. PROCEDURE DATE:  03/15/2012 PROCEDURE:   Colonoscopy, diagnostic ASA CLASS:   Class III INDICATIONS:periumbilical abdominal pain, anemia, non-specific, constipation, and hematochezia. MEDICATIONS: MAC sedation, administered by CRNA  DESCRIPTION OF PROCEDURE:   After the risks benefits and alternatives of the procedure were thoroughly explained, informed consent was obtained.  A digital rectal exam revealed no abnormalities of the rectum.   The     endoscope was introduced through the anus and advanced to the cecum, which was identified by both the appendix and ileocecal valve. No adverse events experienced.   The quality of the prep was prep is suboptimal  The instrument was then slowly withdrawn as the colon was fully examined.    Findings:  Digital rectal exam was normal.  Prep quality was suboptimal and inadequate for screening purposes; viscous liquid stool obscured many views.  No obvious colitis, AVMs, polyps or masses were seen, but subtle polyps, lesions < 20-mm or subtle AVMs could have been easily missed.  No diverticula seen.  No blood seen to the extent of our examination.        Retroflexed view of rectum showed internal hemorrhoids, a bit irritated, otherwise was normal.        Withdrawal time was over 15 minutes     .  The scope was withdrawn and the procedure completed.  ENDOSCOPIC IMPRESSION:     As above.  Internal hemorrhoids likely source of bleeding.  No source of anemia or abdominal pain seen, understanding that prep quality was inadequate for screening purposes.  Suspect anemia is due to restrictive (malabsorptive) anatomy from gastric bypass.   Pain is likely multifactorial,  but constipation-predominant IBS is a leading consideration.  RECOMMENDATIONS:     1.  Watch for potential complications of procedure. 2.  Anusol-HC prn for blood in stool. 3.  Miralax for constipation. 4.  Advance diet. 5.  Hopefully home in the next day or two. 6.  [facility] inpatient team to follow.  eSigned:  Willis Modena, MD 03/15/2012 3:06 PM   cc:

## 2012-03-15 NOTE — Transfer of Care (Signed)
Immediate Anesthesia Transfer of Care Note  Patient: [name]  Procedure(s) Performed: Procedure(s) (LRB) with comments: COLONOSCOPY WITH PROPOFOL (Left)  Patient Location: PACU  Anesthesia Type: MAC  Level of Consciousness: awake and oriented  Airway & Oxygen Therapy: Patient Spontanous Breathing  Post-op Assessment: Report given to PACU RN and Post -op Vital signs reviewed and stable  Post vital signs: Reviewed and stable  Complications: No apparent anesthesia complications

## 2012-03-15 NOTE — Progress Notes (Signed)
Pt unable to tolerate drinking the large amount of bowel prep needed for colonoscopy procedure scheduled this AM. Reported she is able to only tolerate taking in very small amounts secondary to prior gastric bypass surgery. Last noted stool was semi formed and dark green in color. Pt reports that has been her only stool result since beginning the Golytely. Page to [facility], Dr. Matthias Hughs to notify of pt tolerance level of bowel prep. Reported to have pt to continue bowel prep throughout night and he would notify Dr. Dulce Sellar in AM, possibly move procedure to afternoon.

## 2012-03-15 NOTE — H&P (View-Only) (Signed)
[facility] Progress Note  03/15/2012   Subjective: Pt c/o back pain and purulent sinus drainage  Objective:  Vital signs in last 24 hours: Filed Vitals:   03/14/12 1306 03/14/12 2127 03/15/12 0614 03/15/12 0948  BP: 144/87 168/94 154/78 133/98  Pulse: 86 84 85   Temp: 97.7 F (36.5 C) 97.6 F (36.4 C) 98.1 F (36.7 C)   TempSrc: Oral Oral Oral   Resp: 18 18 18   Height:      Weight:      SpO2: 100% 100% 100%    Weight change:   Intake/Output Summary (Last 24 hours) at 03/15/12 1219 Last data filed at 03/15/12 0900  Gross per 24 hour  Intake    240 ml  Output   1350 ml  Net  -1110 ml   Lab Results  Component Value Date   HGBA1C 7.9* 03/13/2012   HGBA1C 8.4* 02/18/2012   HGBA1C 12.3* 03/20/2011   Lab Results  Component Value Date   LDLCALC 117* 03/20/2011   CREATININE 0.57 03/14/2012    Review of Systems As above, otherwise all reviewed and reported negative  Physical Exam General - awake, no distress, cooperative  HEENT - NCAT, MMM swollen nasal turbinates Lungs - BBS, CTA  CV - normal s1, s2 sounds  Abd - Soft, generalized tenderness with palpation, no guarding, no rebound tenderness, no masses palpated, bowel sounds noted, suprapubic tenderness noted  Ext - no C/C/E  Lab Results: Results for orders placed during the hospital encounter of 03/13/12 (from the past 24 hour(s))  CBC     Status: Abnormal   Collection Time   03/14/12  2:14 PM      Component Value Range   WBC 6.1  4.0 - 10.5 K/uL   RBC 3.65 (*) 3.87 - 5.11 MIL/uL   Hemoglobin 9.3 (*) 12.0 - 15.0 g/dL   HCT 28.7 (*) 36.0 - 46.0 %   MCV 78.6  78.0 - 100.0 fL   MCH 25.5 (*) 26.0 - 34.0 pg   MCHC 32.4  30.0 - 36.0 g/dL   RDW 14.5  11.5 - 15.5 %   Platelets 366  150 - 400 K/uL  GLUCOSE, CAPILLARY     Status: Abnormal   Collection Time   03/14/12  3:36 PM      Component Value Range   Glucose-Capillary 115 (*) 70 - 99 mg/dL  CBC     Status: Abnormal   Collection Time   03/14/12  9:45 PM        Component Value Range   WBC 4.3  4.0 - 10.5 K/uL   RBC 3.71 (*) 3.87 - 5.11 MIL/uL   Hemoglobin 9.4 (*) 12.0 - 15.0 g/dL   HCT 28.8 (*) 36.0 - 46.0 %   MCV 77.6 (*) 78.0 - 100.0 fL   MCH 25.3 (*) 26.0 - 34.0 pg   MCHC 32.6  30.0 - 36.0 g/dL   RDW 14.6  11.5 - 15.5 %   Platelets 359  150 - 400 K/uL  GLUCOSE, CAPILLARY     Status: Abnormal   Collection Time   03/14/12  9:50 PM      Component Value Range   Glucose-Capillary 107 (*) 70 - 99 mg/dL  CBC     Status: Abnormal   Collection Time   03/15/12  3:37 AM      Component Value Range   WBC 4.5  4.0 - 10.5 K/uL   RBC 3.88  3.87 - 5.11 MIL/uL     Hemoglobin 9.9 (*) 12.0 - 15.0 g/dL   HCT 30.2 (*) 36.0 - 46.0 %   MCV 77.8 (*) 78.0 - 100.0 fL   MCH 25.5 (*) 26.0 - 34.0 pg   MCHC 32.8  30.0 - 36.0 g/dL   RDW 14.6  11.5 - 15.5 %   Platelets 383  150 - 400 K/uL  GLUCOSE, CAPILLARY     Status: Normal   Collection Time   03/15/12  7:54 AM      Component Value Range   Glucose-Capillary 96  70 - 99 mg/dL   Comment 1 Documented in Chart     Comment 2 Notify RN    GLUCOSE, CAPILLARY     Status: Normal   Collection Time   03/15/12 11:55 AM      Component Value Range   Glucose-Capillary 86  70 - 99 mg/dL   Comment 1 Documented in Chart     Comment 2 Notify RN      Micro Results: Recent Results (from the past 240 hour(s))  CLOSTRIDIUM DIFFICILE BY PCR     Status: Normal   Collection Time   03/13/12  4:26 PM      Component Value Range Status Comment   C difficile by pcr NEGATIVE  NEGATIVE Final     Medications:  Scheduled Meds:   . amitriptyline  25 mg Oral QHS  . bisacodyl  10 mg Oral Once  . calcium-vitamin D  1 tablet Oral Daily  . citalopram  20 mg Oral Daily  . DULoxetine  20 mg Oral BID  . fluticasone  2 spray Each Nare Daily  . lisinopril  20 mg Oral Daily   And  . hydrochlorothiazide  25 mg Oral Daily  . insulin aspart  0-9 Units Subcutaneous TID WC  . insulin glargine  14 Units Subcutaneous QHS  . lidocaine  1  patch Transdermal Q24H  . lubiprostone  24 mcg Oral BID WC  . montelukast  10 mg Oral QHS  . pantoprazole  40 mg Oral Q1200  . polyethylene glycol-electrolytes  4,000 mL Oral Once  . polyethylene glycol-electrolytes  4,000 mL Oral Once  . pregabalin  75 mg Oral TID  . traZODone  50 mg Oral QHS,MR X 1  . vitamin B-12  1,000 mcg Oral Daily   Continuous Infusions:   . sodium chloride 50 mL/hr at 03/14/12 1315   PRN Meds:.albuterol, diclofenac sodium, dicyclomine, ondansetron (ZOFRAN) IV, ondansetron, oxyCODONE-acetaminophen, promethazine  Assessment/Plan: Hematochezia  - bloody stools have stopped - GI to perform colonoscopy today  - stool cultures (studies) pending   Generalized Abdominal Pain  - Pt is significantly constipated  - pt is currently being prepped for colonoscopy   Constipation with nausea and vomiting - as above   Iron Deficiency Anemia - start oral iron when taking p.o.  - following CBC every 8 hours   Diabetes Mellitus type 2  - monitoring BS closely - controlled   Chronic Back Pain from Degenerated discs from morbid obesity  - pain meds as ordered  Sinusitis  - flonase nasal spray  - amoxicillin 875 bid  S/P Gastric Bypass Jan 2013   Vit D Deficiency  (severe) - start replacing orally when can take p.o.    LOS: 2 days   Sinan Tuch 03/15/2012, 12:19 PM  Kareen Jefferys L. Danyle Boening, MD, CDE, FAAFP [facility] [employer] [place], [place]  [phone]  

## 2012-03-15 NOTE — Interval H&P Note (Signed)
History and Physical Interval Note:  03/15/2012 1:37 PM  [name]  has presented today for surgery, with the diagnosis of Hematochezia, abdominal pain  The various methods of treatment have been discussed with the patient and family. After consideration of risks, benefits and other options for treatment, the patient has consented to  Procedure(s) (LRB) with comments: COLONOSCOPY WITH PROPOFOL (Left) as a surgical intervention .  The patient's history has been reviewed, patient examined, no change in status, stable for surgery.  I have reviewed the patient's chart and labs.  Questions were answered to the patient's satisfaction.     Ricardo Schubach M  Assessment:  1.  Abdominal pain.  Likely multifactorial.  Suspect constipation is playing role. 2.  Blood in stool. 3.  Constipation.  Plan:  1.  Colonoscopy with anesthesia-administered propofol for sedation. 2.  Risks (bleeding, infection, bowel perforation that could require surgery, sedation-related changes in cardiopulmonary systems), benefits (identification and possible treatment of source of symptoms, exclusion of certain causes of symptoms), and alternatives (watchful waiting, radiographic imaging studies, empiric medical treatment) of colonoscopy were explained to patient in detail and she wishes to proceed.

## 2012-03-15 NOTE — Progress Notes (Signed)
Pt notified of need to continue to sip on golytely, per MD. Aware to take in nothing else by mouth except golyte

## 2012-03-15 NOTE — Progress Notes (Signed)
[facility] Progress Note  03/15/2012   Subjective: Pt c/o back pain and purulent sinus drainage  Objective:  Vital signs in last 24 hours: Filed Vitals:   03/14/12 1306 03/14/12 2127 03/15/12 0614 03/15/12 0948  BP: 144/87 168/94 154/78 133/98  Pulse: 86 84 85   Temp: 97.7 F (36.5 C) 97.6 F (36.4 C) 98.1 F (36.7 C)   TempSrc: Oral Oral Oral   Resp: 18 18 18    Height:      Weight:      SpO2: 100% 100% 100%    Weight change:   Intake/Output Summary (Last 24 hours) at 03/15/12 1219 Last data filed at 03/15/12 0900  Gross per 24 hour  Intake    240 ml  Output   1350 ml  Net  -1110 ml   Lab Results  Component Value Date   HGBA1C 7.9* 03/13/2012   HGBA1C 8.4* 02/18/2012   HGBA1C 12.3* 03/20/2011   Lab Results  Component Value Date   LDLCALC 117* 03/20/2011   CREATININE 0.57 03/14/2012    Review of Systems As above, otherwise all reviewed and reported negative  Physical Exam General - awake, no distress, cooperative  HEENT - NCAT, MMM swollen nasal turbinates Lungs - BBS, CTA  CV - normal s1, s2 sounds  Abd - Soft, generalized tenderness with palpation, no guarding, no rebound tenderness, no masses palpated, bowel sounds noted, suprapubic tenderness noted  Ext - no C/C/E  Lab Results: Results for orders placed during the hospital encounter of 03/13/12 (from the past 24 hour(s))  CBC     Status: Abnormal   Collection Time   03/14/12  2:14 PM      Component Value Range   WBC 6.1  4.0 - 10.5 K/uL   RBC 3.65 (*) 3.87 - 5.11 MIL/uL   Hemoglobin 9.3 (*) 12.0 - 15.0 g/dL   HCT 16.1 (*) 09.6 - 04.5 %   MCV 78.6  78.0 - 100.0 fL   MCH 25.5 (*) 26.0 - 34.0 pg   MCHC 32.4  30.0 - 36.0 g/dL   RDW 40.9  81.1 - 91.4 %   Platelets 366  150 - 400 K/uL  GLUCOSE, CAPILLARY     Status: Abnormal   Collection Time   03/14/12  3:36 PM      Component Value Range   Glucose-Capillary 115 (*) 70 - 99 mg/dL  CBC     Status: Abnormal   Collection Time   03/14/12  9:45 PM        Component Value Range   WBC 4.3  4.0 - 10.5 K/uL   RBC 3.71 (*) 3.87 - 5.11 MIL/uL   Hemoglobin 9.4 (*) 12.0 - 15.0 g/dL   HCT 78.2 (*) 95.6 - 21.3 %   MCV 77.6 (*) 78.0 - 100.0 fL   MCH 25.3 (*) 26.0 - 34.0 pg   MCHC 32.6  30.0 - 36.0 g/dL   RDW 08.6  57.8 - 46.9 %   Platelets 359  150 - 400 K/uL  GLUCOSE, CAPILLARY     Status: Abnormal   Collection Time   03/14/12  9:50 PM      Component Value Range   Glucose-Capillary 107 (*) 70 - 99 mg/dL  CBC     Status: Abnormal   Collection Time   03/15/12  3:37 AM      Component Value Range   WBC 4.5  4.0 - 10.5 K/uL   RBC 3.88  3.87 - 5.11 MIL/uL  Hemoglobin 9.9 (*) 12.0 - 15.0 g/dL   HCT 16.1 (*) 09.6 - 04.5 %   MCV 77.8 (*) 78.0 - 100.0 fL   MCH 25.5 (*) 26.0 - 34.0 pg   MCHC 32.8  30.0 - 36.0 g/dL   RDW 40.9  81.1 - 91.4 %   Platelets 383  150 - 400 K/uL  GLUCOSE, CAPILLARY     Status: Normal   Collection Time   03/15/12  7:54 AM      Component Value Range   Glucose-Capillary 96  70 - 99 mg/dL   Comment 1 Documented in Chart     Comment 2 Notify RN    GLUCOSE, CAPILLARY     Status: Normal   Collection Time   03/15/12 11:55 AM      Component Value Range   Glucose-Capillary 86  70 - 99 mg/dL   Comment 1 Documented in Chart     Comment 2 Notify RN      Micro Results: Recent Results (from the past 240 hour(s))  CLOSTRIDIUM DIFFICILE BY PCR     Status: Normal   Collection Time   03/13/12  4:26 PM      Component Value Range Status Comment   C difficile by pcr NEGATIVE  NEGATIVE Final     Medications:  Scheduled Meds:   . amitriptyline  25 mg Oral QHS  . bisacodyl  10 mg Oral Once  . calcium-vitamin D  1 tablet Oral Daily  . citalopram  20 mg Oral Daily  . DULoxetine  20 mg Oral BID  . fluticasone  2 spray Each Nare Daily  . lisinopril  20 mg Oral Daily   And  . hydrochlorothiazide  25 mg Oral Daily  . insulin aspart  0-9 Units Subcutaneous TID WC  . insulin glargine  14 Units Subcutaneous QHS  . lidocaine  1  patch Transdermal Q24H  . lubiprostone  24 mcg Oral BID WC  . montelukast  10 mg Oral QHS  . pantoprazole  40 mg Oral Q1200  . polyethylene glycol-electrolytes  4,000 mL Oral Once  . polyethylene glycol-electrolytes  4,000 mL Oral Once  . pregabalin  75 mg Oral TID  . traZODone  50 mg Oral QHS,MR X 1  . vitamin B-12  1,000 mcg Oral Daily   Continuous Infusions:   . sodium chloride 50 mL/hr at 03/14/12 1315   PRN Meds:.albuterol, diclofenac sodium, dicyclomine, ondansetron (ZOFRAN) IV, ondansetron, oxyCODONE-acetaminophen, promethazine  Assessment/Plan: Hematochezia  - bloody stools have stopped - GI to perform colonoscopy today  - stool cultures (studies) pending   Generalized Abdominal Pain  - Pt is significantly constipated  - pt is currently being prepped for colonoscopy   Constipation with nausea and vomiting - as above   Iron Deficiency Anemia - start oral iron when taking p.o.  - following CBC every 8 hours   Diabetes Mellitus type 2  - monitoring BS closely - controlled   Chronic Back Pain from Degenerated discs from morbid obesity  - pain meds as ordered  Sinusitis  - flonase nasal spray  - amoxicillin 875 bid  S/P Gastric Bypass Jan 2013   Vit D Deficiency  (severe) - start replacing orally when can take p.o.    LOS: 2 days   Reta Norgren 03/15/2012, 12:19 PM  Cleora Fleet, MD, CDE, FAAFP [facility] [place], [place]  [phone]

## 2012-03-15 NOTE — Anesthesia Postprocedure Evaluation (Signed)
Anesthesia Post Note  Patient: [name]  Procedure(s) Performed: Procedure(s) (LRB): COLONOSCOPY WITH PROPOFOL (Left)  Anesthesia type: MAC  Patient location: PACU  Post pain: Pain level controlled  Post assessment: Post-op Vital signs reviewed  Last Vitals: BP 181/108  Pulse 91  Temp 36.5 C (Oral)  Resp 15  Ht 5' 5.5" (1.664 m)  Wt 184 lb 1.4 oz (83.5 kg)  BMI 30.17 kg/m2  SpO2 98%  LMP 03/12/2012  Post vital signs: Reviewed  Level of consciousness: awake  Complications: No apparent anesthesia complications

## 2012-03-15 NOTE — Preoperative (Signed)
Beta Blockers   Reason not to administer Beta Blockers:Not Applicable 

## 2012-03-15 NOTE — Progress Notes (Signed)
Occupational Therapy Treatment Patient Details Name: [name] MRN: [id number] DOB: 05/06/1971 Today's Date: 03/15/2012 Time: 4098-1191 OT Time Calculation (min): 23 min  OT Assessment / Plan / Recommendation Comments on Treatment Session Pt currently still displaying LOB with dynamic balance activities. Will benefit from a tubseat if still unsteady at discharge time.    Follow Up Recommendations  No OT follow up;Supervision/Assistance - 24 hour    Barriers to Discharge       Equipment Recommendations  Rolling walker with 5" wheels;Tub/shower seat    Recommendations for Other Services    Frequency Min 2X/week   Plan Discharge plan remains appropriate    Precautions / Restrictions Precautions Precautions: Fall Precaution Comments: pt is unsteady. Cautioned pt to call for assist. Restrictions Weight Bearing Restrictions: No        ADL  Grooming: Performed;Wash/dry face;Teeth care;Minimal assistance;Min guard Where Assessed - Grooming: Unsupported standing Transfers/Ambulation Related to ADLs: Pt walked to bathroom with RW and had 2 LOB during session. One was observed standing at EOB. The other was in the bathroom when she was reaching for objects on the shelf over the toilet. Pt already standing at EOB when OT arrived without RW nearby. Cautioned pt to call for assist when she wants to get up as she is unsteady currently. Pt verbalized understanding.  ADL Comments: Pt stood at sink to brush teeth and wash face with min guard assist for most of the task but then min assist when she was leaning over her walker to reach for items as she became unsteady and had a LOB. Pt leaning over walker to pick up object dropped and  lightly bumped head on shelf over toilet. No sign of cut and pt reports she is ok. Cautioned pt to make sure when she approaches sink to groom that she uses RW all the way up to the sink and stand inside of walker for increased safety. She is more unsteady  without device. She may benefit from a tubseat for discharge. Nursing tech in to help pt with shower and aware to have pt sit on 3in1 in shower for safety.     OT Diagnosis:    OT Problem List:   OT Treatment Interventions:     OT Goals ADL Goals Pt Will Perform Grooming: with modified independence;Standing at sink ADL Goal: Grooming - Progress: Other (comment) (updated to stand at sink.)  Visit Information  Last OT Received On: 03/15/12 Assistance Needed: +1    Subjective Data  Subjective: I want to do a shower Patient Stated Goal: shower   Prior Functioning       Cognition  Overall Cognitive Status: Appears within functional limits for tasks assessed/performed Arousal/Alertness: Awake/alert Behavior During Session: [facility] for tasks performed    Mobility  Shoulder Instructions Transfers Transfers: Stand to Sit Stand to Sit: 5: Supervision;With upper extremity assist;To bed       Exercises      Balance Balance Balance Assessed: Yes Static Sitting Balance Static Sitting - Level of Assistance: 5: Stand by assistance;4: Min assist   End of Session OT - End of Session Activity Tolerance: Patient tolerated treatment well Patient left: Other (comment) (at [facility] with nursing tech for shower)  GO     Lennox Laity [phone] 03/15/2012, 11:42 AM

## 2012-03-16 LAB — COMPREHENSIVE METABOLIC PANEL
ALT: 328 U/L — ABNORMAL HIGH (ref 0–35)
AST: 207 U/L — ABNORMAL HIGH (ref 0–37)
Albumin: 2.7 g/dL — ABNORMAL LOW (ref 3.5–5.2)
CO2: 26 mEq/L (ref 19–32)
Calcium: 8.9 mg/dL (ref 8.4–10.5)
Sodium: 136 mEq/L (ref 135–145)
Total Protein: 6.4 g/dL (ref 6.0–8.3)

## 2012-03-16 LAB — CBC
HCT: 27.7 % — ABNORMAL LOW (ref 36.0–46.0)
Hemoglobin: 9 g/dL — ABNORMAL LOW (ref 12.0–15.0)
Hemoglobin: 9.5 g/dL — ABNORMAL LOW (ref 12.0–15.0)
MCH: 25.1 pg — ABNORMAL LOW (ref 26.0–34.0)
MCH: 25.1 pg — ABNORMAL LOW (ref 26.0–34.0)
MCHC: 32.5 g/dL (ref 30.0–36.0)
MCV: 77.4 fL — ABNORMAL LOW (ref 78.0–100.0)
MCV: 77.6 fL — ABNORMAL LOW (ref 78.0–100.0)
RBC: 3.58 MIL/uL — ABNORMAL LOW (ref 3.87–5.11)
RBC: 3.79 MIL/uL — ABNORMAL LOW (ref 3.87–5.11)
WBC: 5.7 10*3/uL (ref 4.0–10.5)

## 2012-03-16 LAB — PREGNANCY, URINE: Preg Test, Ur: NEGATIVE

## 2012-03-16 LAB — GLUCOSE, CAPILLARY
Glucose-Capillary: 165 mg/dL — ABNORMAL HIGH (ref 70–99)
Glucose-Capillary: 180 mg/dL — ABNORMAL HIGH (ref 70–99)

## 2012-03-16 MED ORDER — PNEUMOCOCCAL VAC POLYVALENT 25 MCG/0.5ML IJ INJ: 0.5000 mL | INJECTION | INTRAMUSCULAR | Status: DC

## 2012-03-16 MED ORDER — POLYETHYLENE GLYCOL 3350 17 G PO PACK
17.0000 g | PACK | Freq: Two times a day (BID) | ORAL | Status: DC
  Administered 2012-03-16 – 2012-03-17 (×2): 17 g via ORAL
  Filled 2012-03-16 (×4): qty 1

## 2012-03-16 MED ORDER — PNEUMOCOCCAL VAC POLYVALENT 25 MCG/0.5ML IJ INJ
0.5000 mL | INJECTION | INTRAMUSCULAR | Status: AC
  Administered 2012-03-16: 0.5 mL via INTRAMUSCULAR
  Filled 2012-03-16: qty 0.5

## 2012-03-16 NOTE — Progress Notes (Signed)
[facility] Progress Note  03/16/2012   Subjective: Feels much better.   Objective:  Vital signs in last 24 hours: Filed Vitals:   03/15/12 2125 03/15/12 2150 03/16/12 0450 03/16/12 0700  BP:  145/80 130/70   Pulse: 86 97 85   Temp:   97.3 F (36.3 C)   TempSrc:   Oral   Resp:   15   Height:      Weight:    84.596 kg (186 lb 8 oz)  SpO2:   100%    Weight change:   Intake/Output Summary (Last 24 hours) at 03/16/12 0924 Last data filed at 03/16/12 0550  Gross per 24 hour  Intake 1533.43 ml  Output    550 ml  Net 983.43 ml   Lab Results  Component Value Date   HGBA1C 7.9* 03/13/2012   HGBA1C 8.4* 02/18/2012   HGBA1C 12.3* 03/20/2011   Lab Results  Component Value Date   LDLCALC 117* 03/20/2011   CREATININE 0.51 03/16/2012    Review of Systems As above, otherwise all reviewed and reported negative  Physical Exam General - awake, no distress, cooperative  HEENT - NCAT, MMM swollen nasal turbinates Lungs - BBS, CTA  CV - normal s1, s2 sounds  Abd - Soft, generalized tenderness with palpation, no guarding, no rebound tenderness, no masses palpated, bowel sounds noted, suprapubic tenderness noted  Ext - no C/C/E  Lab Results: Results for orders placed during the hospital encounter of 03/13/12 (from the past 24 hour(s))  GLUCOSE, CAPILLARY     Status: Normal   Collection Time   03/15/12 11:55 AM      Component Value Range   Glucose-Capillary 86  70 - 99 mg/dL   Comment 1 Documented in Chart     Comment 2 Notify RN    GLUCOSE, CAPILLARY     Status: Abnormal   Collection Time   03/15/12  4:45 PM      Component Value Range   Glucose-Capillary 122 (*) 70 - 99 mg/dL   Comment 1 Documented in Chart     Comment 2 Notify RN    CBC     Status: Abnormal   Collection Time   03/15/12  4:55 PM      Component Value Range   WBC 4.9  4.0 - 10.5 K/uL   RBC 3.69 (*) 3.87 - 5.11 MIL/uL   Hemoglobin 9.4 (*) 12.0 - 15.0 g/dL   HCT 16.1 (*) 09.6 - 04.5 %   MCV 78.3  78.0  - 100.0 fL   MCH 25.5 (*) 26.0 - 34.0 pg   MCHC 32.5  30.0 - 36.0 g/dL   RDW 40.9  81.1 - 91.4 %   Platelets 359  150 - 400 K/uL  PREGNANCY, URINE     Status: Normal   Collection Time   03/15/12  8:36 PM      Component Value Range   Preg Test, Ur NEGATIVE  NEGATIVE  CBC     Status: Abnormal   Collection Time   03/15/12  9:00 PM      Component Value Range   WBC 9.5  4.0 - 10.5 K/uL   RBC 3.70 (*) 3.87 - 5.11 MIL/uL   Hemoglobin 9.3 (*) 12.0 - 15.0 g/dL   HCT 78.2 (*) 95.6 - 21.3 %   MCV 78.1  78.0 - 100.0 fL   MCH 25.1 (*) 26.0 - 34.0 pg   MCHC 32.2  30.0 - 36.0 g/dL   RDW 14.6  11.5 - 15.5 %   Platelets 347  150 - 400 K/uL  GLUCOSE, CAPILLARY     Status: Abnormal   Collection Time   03/15/12  9:42 PM      Component Value Range   Glucose-Capillary 202 (*) 70 - 99 mg/dL   Comment 1 Notify RN     Comment 2 Documented in Chart    COMPREHENSIVE METABOLIC PANEL     Status: Abnormal   Collection Time   03/16/12  5:45 AM      Component Value Range   Sodium 136  135 - 145 mEq/L   Potassium 3.7  3.5 - 5.1 mEq/L   Chloride 101  96 - 112 mEq/L   CO2 26  19 - 32 mEq/L   Glucose, Bld 126 (*) 70 - 99 mg/dL   BUN 8  6 - 23 mg/dL   Creatinine, Ser 9.14  0.50 - 1.10 mg/dL   Calcium 8.9  8.4 - 78.2 mg/dL   Total Protein 6.4  6.0 - 8.3 g/dL   Albumin 2.7 (*) 3.5 - 5.2 g/dL   AST 956 (*) 0 - 37 U/L   ALT 328 (*) 0 - 35 U/L   Alkaline Phosphatase 263 (*) 39 - 117 U/L   Total Bilirubin 0.4  0.3 - 1.2 mg/dL   GFR calc non Af Amer >90  >90 mL/min   GFR calc Af Amer >90  >90 mL/min  CBC     Status: Abnormal   Collection Time   03/16/12  5:45 AM      Component Value Range   WBC 5.2  4.0 - 10.5 K/uL   RBC 3.58 (*) 3.87 - 5.11 MIL/uL   Hemoglobin 9.0 (*) 12.0 - 15.0 g/dL   HCT 21.3 (*) 08.6 - 57.8 %   MCV 77.4 (*) 78.0 - 100.0 fL   MCH 25.1 (*) 26.0 - 34.0 pg   MCHC 32.5  30.0 - 36.0 g/dL   RDW 46.9  62.9 - 52.8 %   Platelets 352  150 - 400 K/uL  GLUCOSE, CAPILLARY     Status: Abnormal     Collection Time   03/16/12  7:58 AM      Component Value Range   Glucose-Capillary 115 (*) 70 - 99 mg/dL    Micro Results: Recent Results (from the past 240 hour(s))  CLOSTRIDIUM DIFFICILE BY PCR     Status: Normal   Collection Time   03/13/12  4:26 PM      Component Value Range Status Comment   C difficile by pcr NEGATIVE  NEGATIVE Final   STOOL CULTURE     Status: Normal (Preliminary result)   Collection Time   03/14/12  9:01 PM      Component Value Range Status Comment   Specimen Description PERIRECTAL   Final    Special Requests Immunocompromised   Final    Culture Culture reincubated for better growth   Final    Report Status PENDING   Incomplete     Medications:  Scheduled Meds:    . amitriptyline  25 mg Oral QHS  . amoxicillin-clavulanate  1 tablet Oral Q12H  . calcium-vitamin D  1 tablet Oral Daily  . citalopram  20 mg Oral Daily  . DULoxetine  20 mg Oral BID  . ferrous sulfate  325 mg Oral Q breakfast  . fluticasone  2 spray Each Nare Daily  . lisinopril  20 mg Oral Daily   And  . hydrochlorothiazide  25  mg Oral Daily  . hydrocortisone  25 mg Rectal BID  . insulin aspart  0-9 Units Subcutaneous TID WC  . insulin glargine  14 Units Subcutaneous QHS  . lidocaine  1 patch Transdermal Q24H  . lubiprostone  24 mcg Oral BID WC  . montelukast  10 mg Oral QHS  . pantoprazole  40 mg Oral Q1200  . pneumococcal 23 valent vaccine  0.5 mL Intramuscular Tomorrow-1000  . polyethylene glycol-electrolytes  4,000 mL Oral Once  . pregabalin  75 mg Oral TID  . saccharomyces boulardii  250 mg Oral BID  . traZODone  50 mg Oral QHS,MR X 1  . vitamin B-12  1,000 mcg Oral Daily  . Vitamin D (Ergocalciferol)  50,000 Units Oral Q7 days  . DISCONTD: amoxicillin  1,000 mg Oral Q12H  . DISCONTD: pneumococcal 23 valent vaccine  0.5 mL Intramuscular Tomorrow-1000   Continuous Infusions:    . sodium chloride 50 mL/hr at 03/14/12 1315  . sodium chloride 10 mL/hr (03/15/12 1545)    PRN Meds:.albuterol, diclofenac sodium, dicyclomine, hydrALAZINE, morphine injection, ondansetron (ZOFRAN) IV, ondansetron, oxyCODONE-acetaminophen, promethazine  Assessment/Plan: Hematochezia  - bloody stools have stopped - appreciate GI consult. S/p colonoscopy and the bleeding probably from internal hemorrhoids. She is on BID miralax and anusol suppositories.  - stool cultures (studies) pending   Generalized Abdominal Pain  - Pt is significantly constipated  S/P Colonoscopy, on BID miralax.   Constipation with nausea and vomiting - as above   Iron Deficiency Anemia - start oral iron when taking p.o.  - following CBC every 8 hours   Diabetes Mellitus type 2  - monitoring BS closely - controlled CBG (last 3)   Basename 03/16/12 1629 03/16/12 1216 03/16/12 0758  GLUCAP 165* 161* 115*       Chronic Back Pain from Degenerated discs from morbid obesity  - pain meds as ordered  Sinusitis  - flonase nasal spray  - amoxicillin 875 bid  S/P Gastric Bypass Jan 2013   Vit D Deficiency  (severe) - start replacing orally when can take p.o.    LOS: 3 days   Marnisha Stampley 03/16/2012, 9:24 AM  Kathlen Mody, MD, [facility] [place], [place]  [phone]

## 2012-03-16 NOTE — Progress Notes (Signed)
OT Note:  Pt in pain after PT.  RN is giving her medicine.  Will likely check back tomorrow.  [place], OTR/L [phone] 03/16/2012

## 2012-03-16 NOTE — Progress Notes (Signed)
Subjective: No further bleeding. No bowel movement since procedure.  Objective: Vital signs in last 24 hours: Temp:  [97.3 F (36.3 C)-98.2 F (36.8 C)] 97.3 F (36.3 C) (10/02 0450) Pulse Rate:  [85-97] 85  (10/02 0450) Resp:  [12-15] 15  (10/02 0450) BP: (130-181)/(70-108) 130/70 mmHg (10/02 0450) SpO2:  [95 %-100 %] 100 % (10/02 0450) Weight:  [84.596 kg (186 lb 8 oz)] 84.596 kg (186 lb 8 oz) (10/02 0700) Weight change:  Last BM Date: 03/15/12  PE: GEN:  Bit somnolent, but arouseable ABD:  Soft, mild chronic generalized tenderness  Lab Results: CBC    Component Value Date/Time   WBC 5.2 03/16/2012 0545   RBC 3.58* 03/16/2012 0545   HGB 9.0* 03/16/2012 0545   HCT 27.7* 03/16/2012 0545   PLT 352 03/16/2012 0545   MCV 77.4* 03/16/2012 0545   MCH 25.1* 03/16/2012 0545   MCHC 32.5 03/16/2012 0545   RDW 14.7 03/16/2012 0545   LYMPHSABS 1.7 03/13/2012 1155   MONOABS 0.5 03/13/2012 1155   EOSABS 0.1 03/13/2012 1155   BASOSABS 0.0 03/13/2012 1155   Assessment:  1. Abdominal pain.  Suspect chronic functional abdominal pain, C-IBS.  Multifactorial. 2.  Blood in stool.  Resolved.  Likely internal hemorrhoid-related. 3.  Constipation.    Plan:  1.  Miralax 17 g bid as maintenance therapy, now and upon discharge. 2.  Anusol suppositories as needed for hemorrhoidal bleeding. 3.  Advance diet. 4.  Judicious narcotics. 5.  Suspect we have maximized inpatient needs for patient; hopefully able to go home in a day or two.   [name] 03/16/2012, 10:09 AM

## 2012-03-16 NOTE — Plan of Care (Signed)
Problem: Phase I Progression Outcomes Goal: Hemodynamically stable Outcome: Progressing BP elevated prn hydralazine given.

## 2012-03-16 NOTE — Progress Notes (Signed)
Have come by four times today and have been unsuccessful at seeing the patient each time. Will refer to on call chaplain for consult.  03/16/12 1400  Clinical Encounter Type  Visited With Health care provider  Visit Type Spiritual support  Referral From Nurse  Recommendations Referral to on call chaplain  Spiritual Encounters  Spiritual Needs Emotional

## 2012-03-16 NOTE — Progress Notes (Signed)
Physical Therapy Treatment Patient Details Name: [name] MRN: [id number] DOB: 1970/10/19 Today's Date: 03/16/2012 Time: 9528-4132 PT Time Calculation (min): 12 min  PT Assessment / Plan / Recommendation Comments on Treatment Session  Pt c/o MAX weakness and pain all over.  Slightly unsteady gait and uses walker for safety.    Follow Up Recommendations  Outpatient PT    Barriers to Discharge        Equipment Recommendations  Rolling walker with 5" wheels;Tub/shower seat    Recommendations for Other Services    Frequency Min 3X/week   Plan Discharge plan remains appropriate    Precautions / Restrictions     Pertinent Vitals/Pain C/o "pain all over"    Mobility  Bed Mobility Bed Mobility: Supine to Sit Supine to Sit: 6: Modified independent (Device/Increase time) Details for Bed Mobility Assistance: increased time  Transfers Transfers: Sit to Stand;Stand to Sit Sit to Stand: 6: Modified independent (Device/Increase time);From bed Stand to Sit: 6: Modified independent (Device/Increase time);To toilet Details for Transfer Assistance: increased time  Ambulation/Gait Ambulation/Gait Assistance: 5: Supervision Ambulation Distance (Feet): 225 Feet Assistive device: Standard walker Ambulation/Gait Assistance Details: slightly unstaedy gait with c/o MAX weakness and pain all over Gait Pattern: Step-through pattern;Decreased stride length;Shuffle Gait velocity: decreased     PT Goals  progressing    Visit Information  Last PT Received On: 03/16/12 Assistance Needed: +1    Subjective Data  Subjective: I hurt all over   Cognition    good   Balance   fair  End of Session PT - End of Session Equipment Utilized During Treatment: Gait belt Activity Tolerance: Patient tolerated treatment well Patient left:  (in bathroom with call light in reach and family in room)  Felecia Shelling  PTA [facility]  Acute  Rehab Pager     [phone]

## 2012-03-17 DIAGNOSIS — F411 Generalized anxiety disorder: Secondary | ICD-10-CM

## 2012-03-17 LAB — GLUCOSE, CAPILLARY

## 2012-03-17 LAB — CBC
Hemoglobin: 8.4 g/dL — ABNORMAL LOW (ref 12.0–15.0)
MCH: 24.8 pg — ABNORMAL LOW (ref 26.0–34.0)
MCHC: 31.6 g/dL (ref 30.0–36.0)
Platelets: 297 10*3/uL (ref 150–400)
RBC: 3.39 MIL/uL — ABNORMAL LOW (ref 3.87–5.11)

## 2012-03-17 MED ORDER — LUBIPROSTONE 24 MCG PO CAPS: 24.0000 ug | ORAL_CAPSULE | Freq: Two times a day (BID) | ORAL | Status: DC

## 2012-03-17 MED ORDER — OXYCODONE-ACETAMINOPHEN 5-325 MG PO TABS: 1.0000 | ORAL_TABLET | Freq: Three times a day (TID) | ORAL | Status: DC | PRN

## 2012-03-17 MED ORDER — FERROUS SULFATE 325 (65 FE) MG PO TABS: 325.0000 mg | ORAL_TABLET | Freq: Every day | ORAL | Status: DC

## 2012-03-17 MED ORDER — AMOXICILLIN-POT CLAVULANATE 875-125 MG PO TABS: 1.0000 | ORAL_TABLET | Freq: Two times a day (BID) | ORAL | Status: DC

## 2012-03-17 MED ORDER — POLYETHYLENE GLYCOL 3350 17 G PO PACK: 17.0000 g | PACK | Freq: Two times a day (BID) | ORAL | Status: DC

## 2012-03-17 MED ORDER — HYDROCORTISONE ACETATE 25 MG RE SUPP: 25.0000 mg | Freq: Two times a day (BID) | RECTAL | Status: DC

## 2012-03-17 MED ORDER — SACCHAROMYCES BOULARDII 250 MG PO CAPS: 250.0000 mg | ORAL_CAPSULE | Freq: Two times a day (BID) | ORAL | Status: DC

## 2012-03-17 NOTE — Progress Notes (Signed)
Occupational Therapy Treatment Patient Details Name: [name] MRN: [id number] DOB: Oct 30, 1970 Today's Date: 03/17/2012 Time: 4098-1191 OT Time Calculation (min): 27 min  OT Assessment / Plan / Recommendation Comments on Treatment Session      Follow Up Recommendations  No OT follow up;Supervision/Assistance - 24 hour    Barriers to Discharge       Equipment Recommendations  Rolling walker with 5" wheels;Tub/shower seat    Recommendations for Other Services    Frequency Min 2X/week   Plan Discharge plan remains appropriate    Precautions / Restrictions Precautions Precautions: Fall Restrictions Weight Bearing Restrictions: No   Pertinent Vitals/Pain 9/10 pain neck down to feet; premedicated.  Got nauseaus at end of session--RN notified    ADL  Lower Body Dressing: Performed;Supervision/safety (with AE and instructional cues) Where Assessed - Lower Body Dressing: Unsupported sit to stand Toilet Transfer: Performed;Min guard Toilet Transfer Method: Sit to stand Toilet Transfer Equipment: Comfort height toilet Transfers/Ambulation Related to ADLs: Pt was ambulating in room without AD--unsteady at time.  Pt says she had near fall earlier.  Encouraged to call for A and to use AD.   ADL Comments: Recommended tub transfer bench, and information provided.  Pt unsteady and this will be safest option.  Showed pt a picture of this DME.  Pt states that she often has so much pain that she cannot cross legs for ADLs.  Educated on AE--pt return demonstrated.  Resource list given    OT Diagnosis:    OT Problem List:   OT Treatment Interventions:     OT Goals ADL Goals Pt Will Perform Lower Body Dressing: with modified independence;Sit to stand from chair;Other (comment) ADL Goal: Lower Body Dressing - Progress: Progressing toward goals Additional ADL Goal #1: Pt will complete all aspects of toileting on regular commode with mod I if assistive device needed for ambulation. ADL  Goal: Additional Goal #1 - Progress: Progressing toward goals  Visit Information  Last OT Received On: 03/17/12 Assistance Needed: +1    Subjective Data      Prior Functioning       Cognition  Overall Cognitive Status: Appears within functional limits for tasks assessed/performed (decreased safety but pt aware of deficits) Arousal/Alertness: Awake/alert Behavior During Session: [facility] for tasks performed Cognition - Other Comments: pt is aware that she is unsteady but does not always choose to call for A or use AD    Mobility  Shoulder Instructions Transfers Sit to Stand: 6: Modified independent (Device/Increase time);From bed Stand to Sit: 6: Modified independent (Device/Increase time);To toilet       Exercises      Balance     End of Session OT - End of Session Activity Tolerance: Patient limited by pain Patient left: in bed;with call bell/phone within reach Nurse Communication: Other (comment) (medicine for nausea)  GO     Elian Gloster 03/17/2012, 11:12 AM Marica Otter, OTR/L [phone] 03/17/2012

## 2012-03-17 NOTE — Progress Notes (Signed)
Pt D/C home, Pt is stable, on room air. no new complain. D/C instructions and medication administration instructions done, pt verbalizes understanding

## 2012-03-17 NOTE — Discharge Summary (Signed)
Physician Discharge Summary  [name] HYQ:[id number] DOB: 07-15-1970 DOA: 03/13/2012  PCP: [name], MD  Admit date: 03/13/2012 Discharge date: 03/17/2012  Recommendations for Outpatient Follow-up:  1. Outpatient follow up with PCP  Discharge Diagnoses:  Principal Problem:  *Hematochezia Active Problems:  Degenerative disc disease  Nausea and vomiting  Compulsive behavior disorder  History of gastric bypass  Anxiety disorder  Nervous breakdown  Anemia  DM type 2 (diabetes mellitus, type 2)  Generalized weakness  Generalized abdominal pain  Hyperactive bowel sounds  Dehydration  Insomnia  Diabetic neuropathy  Migraine, unspecified, without mention of intractable migraine without mention of status migrainosus   Discharge Condition: stable Diet recommendation: carb modified diet.  Filed Weights   03/13/12 1545 03/16/12 0700 03/17/12 0500  Weight: 83.5 kg (184 lb 1.4 oz) 84.596 kg (186 lb 8 oz) 93.3 kg (205 lb 11 oz)    History of present illness:  [name] is an 41 y.o. female with chronic pain, chronic back pain, status post recent gastric bypass January 2013, presented to the emergency department complaining of 4-5 days of bloody diarrhea. The patient reports she's been having an acute exacerbation of her chronic abdominal pain over the past several days. She reports no changes in her diet. She reports generalized pain that is nonspecific. She reports that 4 days ago she initially saw blood in her stool. She also reports that she's had some low-grade temperature. The patient reports nausea and occasional emesis. The patient reports that she's lost approximately 70 pounds since she had her gastric bypass 10 months ago. The patient reports that she is on a very low carbohydrate diet because of her history of poorly controlled diabetes mellitus. She reports that her hemoglobin A1c has improved from 14% prior to gastric bypass 27% post gastric bypass. The  patient has complications of diabetes including neuropathy. She has an unknown history of gastroparesis. In the emergency department she was found to have an anemia. Her hemoglobin decreased from 9 to 8. The patient was clinically dehydrated. The patient is also complaining of sinus drainage and purulent foul-smelling drainage from sinuses. The hospitalization was requested for further evaluation and management   Hospital Course:   Hematochezia: bloody stools have stopped  - GI consulted. S/p colonoscopy and the bleeding probably from internal hemorrhoids. She is on BID miralax and anusol suppositories.  - stool cultures (studies) negative so far.   Generalized Abdominal Pain  - Pt is significantly constipated on admission, which has resolved after being on stool softners.  S/P Colonoscopy, on BID miralax.   Constipation with nausea and vomiting - resolved.  Iron Deficiency Anemia - start taking iron pills.  Diabetes Mellitus type 2  - monitoring BS closely  - controlled  CBG (last 3)   Basename  03/16/12 1629  03/16/12 1216  03/16/12 0758   GLUCAP  165*  161*  115*    Chronic Back Pain from Degenerated discs from morbid obesity  - pain meds as ordered   Sinusitis  - flonase nasal spray  -  Completed amoxicillin course .   S/P Gastric Bypass Jan 2013   Vit D Deficiency (severe)  - start replacing orally outpatient.   CONSULTATIONS GI CONSULT    Discharge Exam: Filed Vitals:   03/16/12 0700 03/16/12 1401 03/16/12 2045 03/17/12 0500  BP:  163/83 154/87 155/82  Pulse:  87 85 82  Temp:  98.7 F (37.1 C) 98 F (36.7 C) 98.2 F (36.8 C)  TempSrc:  Oral Oral Oral  Resp:  16 17 16   Height:      Weight: 84.596 kg (186 lb 8 oz)   93.3 kg (205 lb 11 oz)  SpO2:  100% 98% 100%   Physical Exam  General - awake, no distress, cooperative  HEENT - NCAT, MMM swollen nasal turbinates Lungs - BBS, CTA  CV - normal s1, s2 sounds  Abd - Soft, generalized tenderness with  palpation, no guarding, no rebound tenderness, no masses palpated, bowel sounds noted, suprapubic tenderness noted  Ext - no C/C/E    Discharge Instructions  Discharge Orders    Future Orders Please Complete By Expires   Diet - low sodium heart healthy      Discharge instructions      Comments:   Follow up with Dr Dulce Sellar as needed.       Medication List     As of 03/17/2012 12:41 PM    STOP taking these medications         meloxicam 7.5 MG tablet   Commonly known as: MOBIC      TAKE these medications         albuterol 108 (90 BASE) MCG/ACT inhaler   Commonly known as: PROVENTIL HFA;VENTOLIN HFA   Inhale 2 puffs into the lungs every 6 (six) hours as needed. WHEEZING        amitriptyline 25 MG tablet   Commonly known as: ELAVIL   Take 1 tablet (25 mg total) by mouth at bedtime. For pain management, depression, and insomnia.      amoxicillin-clavulanate 875-125 MG per tablet   Commonly known as: AUGMENTIN   Take 1 tablet by mouth every 12 (twelve) hours.      Biotin 1000 MCG tablet   Take 1,000 mcg by mouth daily.      calcium-vitamin D 500-200 MG-UNIT per tablet   Commonly known as: OSCAL WITH D   Take 1 tablet by mouth daily.      citalopram 20 MG tablet   Commonly known as: CELEXA   Take 1 tablet (20 mg total) by mouth daily. For depression.      dicyclomine 10 MG capsule   Commonly known as: BENTYL   Take 1 capsule (10 mg total) by mouth 4 (four) times daily as needed (cramps). For cramps      DULoxetine 20 MG capsule   Commonly known as: CYMBALTA   Take 1 capsule (20 mg total) by mouth 2 (two) times daily. For depression and pain management.      ferrous sulfate 325 (65 FE) MG tablet   Take 1 tablet (325 mg total) by mouth daily with breakfast.      hydrocortisone 25 MG suppository   Commonly known as: ANUSOL-HC   Place 1 suppository (25 mg total) rectally 2 (two) times daily.      insulin glargine 100 UNIT/ML injection   Commonly known as: LANTUS    Inject 20 Units into the skin at bedtime. For control of blood sugar      lidocaine 5 %   Commonly known as: LIDODERM   Place 1 patch onto the skin daily. Remove & Discard patch within 12 hours for pain management      lisinopril-hydrochlorothiazide 20-25 MG per tablet   Commonly known as: PRINZIDE,ZESTORETIC   Take 1 tablet by mouth daily.      lubiprostone 24 MCG capsule   Commonly known as: AMITIZA   Take 1 capsule (24 mcg total) by mouth 2 (two)  times daily with a meal.      montelukast 10 MG tablet   Commonly known as: SINGULAIR   Take 10 mg by mouth at bedtime.      oxyCODONE-acetaminophen 5-325 MG per tablet   Commonly known as: PERCOCET/ROXICET   Take 1 tablet by mouth every 8 (eight) hours as needed.      pantoprazole 40 MG tablet   Commonly known as: PROTONIX   Take 1 tablet (40 mg total) by mouth daily. For control of stomach acid secretion and helps GERD.      polyethylene glycol packet   Commonly known as: MIRALAX / GLYCOLAX   Take 17 g by mouth 2 (two) times daily.      pregabalin 75 MG capsule   Commonly known as: LYRICA   Take 1 capsule (75 mg total) by mouth 3 (three) times daily. neuropathic pain management      saccharomyces boulardii 250 MG capsule   Commonly known as: FLORASTOR   Take 1 capsule (250 mg total) by mouth 2 (two) times daily.      traZODone 50 MG tablet   Commonly known as: DESYREL   Take 1 tablet (50 mg total) by mouth at bedtime and may repeat dose one time if needed. For insomnia.      vitamin B-12 1000 MCG tablet   Commonly known as: CYANOCOBALAMIN   Take 1 tablet (1,000 mcg total) by mouth daily. For nutritional supplementation.          The results of significant diagnostics from this hospitalization (including imaging, microbiology, ancillary and laboratory) are listed below for reference.    Significant Diagnostic Studies: Acute Abdominal Series  03-25-12  *RADIOLOGY REPORT*  Clinical Data: Abdominal pain.  History  of gastric bypass.  ACUTE ABDOMEN SERIES (ABDOMEN 2 VIEW & CHEST 1 VIEW)  Comparison: Plain films of the abdomen 07/26/2011 and PA and lateral chest 03/31/2011.  Findings: Single view of the chest demonstrates clear lungs.  Heart size is upper normal.  No pneumothorax or pleural fluid.  Two views of the abdomen show no free intraperitoneal air.  There is a large volume of stool in the descending and transverse colon. Gas filled but nondilated loops of small bowel are seen in the right abdomen.  The patient is status post gastric bypass and cholecystectomy.  IMPRESSION:  1.  No acute cardiopulmonary disease. 2.  Gas filled but nondilated loops of small bowel in the right abdomen are nonspecific and could be due to some inflammatory process.  The appearance is not typical of obstruction.  No free intraperitoneal air is seen. 3.  Large volume of stool transverse and descending colon.   Original Report Authenticated By: Bernadene Bell. Maricela Curet, M.D.     Microbiology: Recent Results (from the past 240 hour(s))  CLOSTRIDIUM DIFFICILE BY PCR     Status: Normal   Collection Time   Mar 25, 2012  4:26 PM      Component Value Range Status Comment   C difficile by pcr NEGATIVE  NEGATIVE Final   STOOL CULTURE     Status: Normal (Preliminary result)   Collection Time   03/14/12  9:01 PM      Component Value Range Status Comment   Specimen Description PERIRECTAL   Final    Special Requests Immunocompromised   Final    Culture NO SUSPICIOUS COLONIES, CONTINUING TO HOLD   Final    Report Status PENDING   Incomplete      Labs: Basic Metabolic Panel:  Lab 03/16/12 0545 03/14/12 0345 03/13/12 1701 03/13/12 1155  NA 136 139 -- 139  K 3.7 3.7 -- 3.6  CL 101 109 -- 107  CO2 26 22 -- 22  GLUCOSE 126* 103* -- 166*  BUN 8 16 -- 15  CREATININE 0.51 0.57 -- 0.52  CALCIUM 8.9 8.2* -- 8.6  MG -- -- 2.1 --  PHOS -- -- 3.5 --   Liver Function Tests:  Lab 03/16/12 0545 03/14/12 0345  AST 207* 56*  ALT 328* 51*  ALKPHOS  263* 130*  BILITOT 0.4 0.2*  PROT 6.4 5.8*  ALBUMIN 2.7* 2.5*   No results found for this basename: LIPASE:5,AMYLASE:5 in the last 168 hours No results found for this basename: AMMONIA:5 in the last 168 hours CBC:  Lab 03/17/12 0341 03/16/12 1303 03/16/12 0545 03/15/12 2100 03/15/12 1655 03/13/12 1155  WBC 4.8 5.7 5.2 9.5 4.9 --  NEUTROABS -- -- -- -- -- 7.0  HGB 8.4* 9.5* 9.0* 9.3* 9.4* --  HCT 26.6* 29.4* 27.7* 28.9* 28.9* --  MCV 78.5 77.6* 77.4* 78.1 78.3 --  PLT 297 353 352 347 359 --   Cardiac Enzymes: No results found for this basename: CKTOTAL:5,CKMB:5,CKMBINDEX:5,TROPONINI:5 in the last 168 hours BNP: BNP (last 3 results) No results found for this basename: PROBNP:3 in the last 8760 hours CBG:  Lab 03/17/12 1151 03/17/12 0748 03/16/12 2117 03/16/12 1629 03/16/12 1216  GLUCAP 159* 157* 180* 165* 161*      Signed:  Elianne Gubser  [facility] 03/17/2012, 12:41 PM

## 2012-03-18 LAB — STOOL CULTURE

## 2012-04-25 ENCOUNTER — Emergency Department (HOSPITAL_COMMUNITY)
Admission: EM | Admit: 2012-04-25 | Discharge: 2012-04-25 | Disposition: A | Discharge status: Home / Self Care | Payer: Medicare HMO | Attending: Emergency Medicine | Admitting: Emergency Medicine

## 2012-04-25 ENCOUNTER — Encounter (HOSPITAL_COMMUNITY): Payer: Self-pay | Admitting: *Deleted

## 2012-04-25 DIAGNOSIS — Z79899 Other long term (current) drug therapy: Secondary | ICD-10-CM | POA: Insufficient documentation

## 2012-04-25 DIAGNOSIS — F329 Major depressive disorder, single episode, unspecified: Secondary | ICD-10-CM | POA: Insufficient documentation

## 2012-04-25 DIAGNOSIS — Z794 Long term (current) use of insulin: Secondary | ICD-10-CM | POA: Insufficient documentation

## 2012-04-25 DIAGNOSIS — G4700 Insomnia, unspecified: Secondary | ICD-10-CM | POA: Insufficient documentation

## 2012-04-25 DIAGNOSIS — E119 Type 2 diabetes mellitus without complications: Secondary | ICD-10-CM | POA: Insufficient documentation

## 2012-04-25 DIAGNOSIS — I10 Essential (primary) hypertension: Secondary | ICD-10-CM | POA: Insufficient documentation

## 2012-04-25 DIAGNOSIS — G8929 Other chronic pain: Secondary | ICD-10-CM | POA: Insufficient documentation

## 2012-04-25 DIAGNOSIS — J45909 Unspecified asthma, uncomplicated: Secondary | ICD-10-CM | POA: Insufficient documentation

## 2012-04-25 DIAGNOSIS — F411 Generalized anxiety disorder: Secondary | ICD-10-CM | POA: Insufficient documentation

## 2012-04-25 DIAGNOSIS — IMO0001 Reserved for inherently not codable concepts without codable children: Secondary | ICD-10-CM | POA: Insufficient documentation

## 2012-04-25 DIAGNOSIS — M549 Dorsalgia, unspecified: Secondary | ICD-10-CM | POA: Insufficient documentation

## 2012-04-25 DIAGNOSIS — F3289 Other specified depressive episodes: Secondary | ICD-10-CM | POA: Insufficient documentation

## 2012-04-25 DIAGNOSIS — Z8739 Personal history of other diseases of the musculoskeletal system and connective tissue: Secondary | ICD-10-CM | POA: Insufficient documentation

## 2012-04-25 DIAGNOSIS — K219 Gastro-esophageal reflux disease without esophagitis: Secondary | ICD-10-CM | POA: Insufficient documentation

## 2012-04-25 LAB — URINALYSIS, ROUTINE W REFLEX MICROSCOPIC
Leukocytes, UA: NEGATIVE
Nitrite: NEGATIVE
Specific Gravity, Urine: 1.023 (ref 1.005–1.030)
Urobilinogen, UA: 0.2 mg/dL (ref 0.0–1.0)
pH: 5.5 (ref 5.0–8.0)

## 2012-04-25 LAB — COMPREHENSIVE METABOLIC PANEL
ALT: 41 U/L — ABNORMAL HIGH (ref 0–35)
Alkaline Phosphatase: 144 U/L — ABNORMAL HIGH (ref 39–117)
BUN: 11 mg/dL (ref 6–23)
CO2: 21 mEq/L (ref 19–32)
Chloride: 103 mEq/L (ref 96–112)
GFR calc Af Amer: >90 mL/min (ref >90)
GFR calc non Af Amer: >90 mL/min (ref >90)
Glucose, Bld: 252 mg/dL — ABNORMAL HIGH (ref 70–99)
Potassium: 3.7 mEq/L (ref 3.5–5.1)
Sodium: 135 mEq/L (ref 135–145)
Total Bilirubin: 0.3 mg/dL (ref 0.3–1.2)

## 2012-04-25 LAB — CBC
HCT: 30.3 % — ABNORMAL LOW (ref 36.0–46.0)
Hemoglobin: 9.7 g/dL — ABNORMAL LOW (ref 12.0–15.0)
RBC: 3.87 MIL/uL (ref 3.87–5.11)

## 2012-04-25 LAB — URINE MICROSCOPIC-ADD ON

## 2012-04-25 MED ORDER — HYDROMORPHONE HCL PF 2 MG/ML IJ SOLN
2.0000 mg | Freq: Once | INTRAMUSCULAR | Status: AC
  Administered 2012-04-25: 2 mg via INTRAMUSCULAR
  Filled 2012-04-25: qty 1

## 2012-04-25 MED ORDER — ONDANSETRON 8 MG PO TBDP
8.0000 mg | ORAL_TABLET | Freq: Once | ORAL | Status: AC
  Administered 2012-04-25: 8 mg via ORAL
  Filled 2012-04-25: qty 1

## 2012-04-25 MED ORDER — ONDANSETRON 8 MG PO TBDP: 8.0000 mg | ORAL_TABLET | Freq: Three times a day (TID) | ORAL | Status: DC | PRN

## 2012-04-25 NOTE — ED Notes (Signed)
Pt arrives by [facility] c/o lower back, reports hx of disc problems. Reports pain from fall year ago, pain has worsened in past 2 days.

## 2012-04-25 NOTE — ED Provider Notes (Signed)
History     CSN: [id number]  Arrival date & time 04/25/12  1056   First MD Initiated Contact with Patient 04/25/12 1121      Chief Complaint  Patient presents with  . Back Pain     The history is provided by the patient.   patient reports a long-standing history of back pain.  She reports she takes Percocet for her back but it has been hurting her more recently.  She denies fevers or chills.  She has no new weakness in her lower extremities.  She has no bowel or bladder complaints.  No dysuria or urinary frequency.  The paranasal numbness.  No recent fevers.  She's had some mild generalized abdominal pain with nausea and no vomiting.  She also reports headache over the past 2-3 days.  She reports taking one Percocet this morning without improvement in her symptoms.  She reports she's been on Percocet for years as prescribed by her primary care physician for chronic back pain.  No prior surgical procedures on her back.  Pain is moderate in severity this time.  Nothing worsens or improves her pain.  Past Medical History  Diagnosis Date  . Arthritis   . Asthma   . GERD (gastroesophageal reflux disease)   . Diabetes mellitus   . Hyperlipidemia   . Migraines   . Hypertension   . Back pain, chronic   . Depression   . Neuropathy   . Anxiety   . Nervous breakdown sept 2013  . Fibromyalgia   . History of gastric bypass Jan 2013  . Insomnia     Past Surgical History  Procedure Date  . Back surgery November 1992  . Cesarean section December 1998  . Tubal ligation January 1999  . Cholecystectomy 1998  . Gastric roux-en-y 06/30/2011    Procedure: LAPAROSCOPIC ROUX-EN-Y GASTRIC;  Surgeon: Mariella Saa, MD;  Location: [facility];  Service: General;  Laterality: N/A;     . Esophagogastroduodenoscopy 07/27/2011    Procedure: ESOPHAGOGASTRODUODENOSCOPY (EGD);  Surgeon: Freddy Jaksch, MD;  Location: [facility];  Service: Endoscopy;  Laterality: N/A;    Family History  Problem  Relation Age of Onset  . Diabetes Maternal Aunt   . Diabetes Paternal Aunt   . Diabetes Paternal Grandfather     History  Substance Use Topics  . Smoking status: Never Smoker   . Smokeless tobacco: Never Used  . Alcohol Use: No    OB History    Grav Para Term Preterm Abortions TAB SAB Ect Mult Living                  Review of Systems  All other systems reviewed and are negative.    Allergies  Imitrex  Home Medications   Current Outpatient Rx  Name  Route  Sig  Dispense  Refill  . ALBUTEROL SULFATE HFA 108 (90 BASE) MCG/ACT IN AERS   Inhalation   Inhale 2 puffs into the lungs every 6 (six) hours as needed. WHEEZING          . AMITRIPTYLINE HCL 25 MG PO TABS   Oral   Take 1 tablet (25 mg total) by mouth at bedtime. For pain management, depression, and insomnia.   30 tablet   0   . BIOTIN 1000 MCG PO TABS   Oral   Take 1,000 mcg by mouth daily.         Marland Kitchen CALCIUM CARBONATE-VITAMIN D 500-200 MG-UNIT PO TABS   Oral  Take 1 tablet by mouth daily.         Marland Kitchen CITALOPRAM HYDROBROMIDE 20 MG PO TABS   Oral   Take 1 tablet (20 mg total) by mouth daily. For depression.   30 tablet   0   . DULOXETINE HCL 20 MG PO CPEP   Oral   Take 1 capsule (20 mg total) by mouth 2 (two) times daily. For depression and pain management.   30 capsule   0   . FERROUS SULFATE 325 (65 FE) MG PO TABS   Oral   Take 1 tablet (325 mg total) by mouth daily with breakfast.   60 tablet   0   . HYDROCORTISONE ACETATE 25 MG RE SUPP   Rectal   Place 1 suppository (25 mg total) rectally 2 (two) times daily.   12 suppository   0   . INSULIN GLARGINE 100 UNIT/ML [place] SOLN   Subcutaneous   Inject 20 Units into the skin at bedtime. For control of blood sugar   10 mL   0   . LISINOPRIL-HYDROCHLOROTHIAZIDE 20-25 MG PO TABS   Oral   Take 1 tablet by mouth daily.         . LUBIPROSTONE 24 MCG PO CAPS   Oral   Take 1 capsule (24 mcg total) by mouth 2 (two) times daily with a  meal.   30 capsule   1   . MONTELUKAST SODIUM 10 MG PO TABS   Oral   Take 10 mg by mouth at bedtime.         . OXYCODONE-ACETAMINOPHEN 5-325 MG PO TABS   Oral   Take 1 tablet by mouth every 8 (eight) hours as needed.   15 tablet   0   . PANTOPRAZOLE SODIUM 40 MG PO TBEC   Oral   Take 1 tablet (40 mg total) by mouth daily. For control of stomach acid secretion and helps GERD.   30 tablet   0   . POLYETHYLENE GLYCOL 3350 PO PACK   Oral   Take 17 g by mouth 2 (two) times daily.   14 each   1   . PREGABALIN 75 MG PO CAPS   Oral   Take 1 capsule (75 mg total) by mouth 3 (three) times daily. neuropathic pain management   90 capsule   0   . TRAZODONE HCL 50 MG PO TABS   Oral   Take 50 mg by mouth at bedtime.         Marland Kitchen VITAMIN B-12 1000 MCG PO TABS   Oral   Take 1 tablet (1,000 mcg total) by mouth daily. For nutritional supplementation.   30 tablet   0     BP 147/93  Pulse 87  Temp 97.8 F (36.6 C) (Oral)  Resp 20  SpO2 100%  LMP 04/11/2012  Physical Exam  Nursing note and vitals reviewed. Constitutional: She is oriented to person, place, and time. She appears well-developed and well-nourished. No distress.  HENT:  Head: Normocephalic and atraumatic.  Eyes: EOM are normal.  Neck: Normal range of motion.  Cardiovascular: Normal rate, regular rhythm and normal heart sounds.   Pulmonary/Chest: Effort normal and breath sounds normal.  Abdominal: Soft. She exhibits no distension. There is no tenderness.  Musculoskeletal: Normal range of motion.       Patient able to ambulate.  Gait normal.  5 out of 5 strength in bilateral lower extremity major muscle groups  Neurological: She is alert and  oriented to person, place, and time.  Skin: Skin is warm and dry.  Psychiatric: She has a normal mood and affect. Judgment normal.    ED Course  Procedures (including critical care time)  Labs Reviewed  CBC - Abnormal; Notable for the following:    WBC 11.3 (*)      Hemoglobin 9.7 (*)     HCT 30.3 (*)     MCH 25.1 (*)     All other components within normal limits  COMPREHENSIVE METABOLIC PANEL - Abnormal; Notable for the following:    Glucose, Bld 252 (*)     Albumin 2.9 (*)     AST 83 (*)     ALT 41 (*)     Alkaline Phosphatase 144 (*)     All other components within normal limits  URINALYSIS, ROUTINE W REFLEX MICROSCOPIC - Abnormal; Notable for the following:    Protein, ur 30 (*)     All other components within normal limits  URINE MICROSCOPIC-ADD ON - Abnormal; Notable for the following:    Squamous Epithelial / LPF FEW (*)     Bacteria, UA FEW (*)     All other components within normal limits  LIPASE, BLOOD  HCG, SERUM, QUALITATIVE   No results found.   1. Back pain       MDM  1:30 PM The patient's pain is much better at this time.  He seems to be chronic back pain.  Repeat abdominal exam is benign.  Labs are without significant abnormality.  Discharge home in good condition with PCP followup        Lyanne Co, MD 04/25/12 1332

## 2012-04-25 NOTE — ED Notes (Signed)
MD at bedside. 

## 2012-04-25 NOTE — ED Notes (Signed)
Pt states "my back is killing me, my stomach hurts and I have a headache." pt states "I take percocet for pain but its not doing anything, I think I need something stronger. "

## 2012-07-20 ENCOUNTER — Other Ambulatory Visit: Payer: Self-pay | Admitting: Neurosurgery

## 2012-07-20 DIAGNOSIS — M545 Low back pain: Secondary | ICD-10-CM

## 2012-07-25 ENCOUNTER — Other Ambulatory Visit: Payer: Self-pay | Admitting: Family Medicine

## 2012-07-25 DIAGNOSIS — Z1231 Encounter for screening mammogram for malignant neoplasm of breast: Secondary | ICD-10-CM

## 2012-07-28 ENCOUNTER — Other Ambulatory Visit: Payer: Self-pay

## 2012-08-06 ENCOUNTER — Ambulatory Visit
Admission: RE | Admit: 2012-08-06 | Discharge: 2012-08-06 | Disposition: A | Discharge status: Home / Self Care | Payer: Medicare HMO | Source: Ambulatory Visit | Attending: Neurosurgery | Admitting: Neurosurgery

## 2012-08-23 ENCOUNTER — Ambulatory Visit: Payer: Self-pay

## 2012-09-02 ENCOUNTER — Ambulatory Visit (HOSPITAL_BASED_OUTPATIENT_CLINIC_OR_DEPARTMENT_OTHER): Payer: Medicare HMO | Attending: Family Medicine

## 2012-09-02 VITALS — Ht 65.0 in | Wt 190.0 lb

## 2012-09-02 DIAGNOSIS — R5381 Other malaise: Secondary | ICD-10-CM | POA: Insufficient documentation

## 2012-09-02 DIAGNOSIS — G4733 Obstructive sleep apnea (adult) (pediatric): Secondary | ICD-10-CM

## 2012-09-02 DIAGNOSIS — G8929 Other chronic pain: Secondary | ICD-10-CM | POA: Insufficient documentation

## 2012-09-02 DIAGNOSIS — R059 Cough, unspecified: Secondary | ICD-10-CM | POA: Insufficient documentation

## 2012-09-02 DIAGNOSIS — R05 Cough: Secondary | ICD-10-CM | POA: Insufficient documentation

## 2012-09-02 DIAGNOSIS — M549 Dorsalgia, unspecified: Secondary | ICD-10-CM | POA: Insufficient documentation

## 2012-09-02 DIAGNOSIS — R6889 Other general symptoms and signs: Secondary | ICD-10-CM | POA: Insufficient documentation

## 2012-09-02 DIAGNOSIS — R259 Unspecified abnormal involuntary movements: Secondary | ICD-10-CM | POA: Insufficient documentation

## 2012-09-02 DIAGNOSIS — R61 Generalized hyperhidrosis: Secondary | ICD-10-CM | POA: Insufficient documentation

## 2012-09-02 DIAGNOSIS — R51 Headache: Secondary | ICD-10-CM | POA: Insufficient documentation

## 2012-09-02 DIAGNOSIS — R0989 Other specified symptoms and signs involving the circulatory and respiratory systems: Secondary | ICD-10-CM | POA: Insufficient documentation

## 2012-09-02 DIAGNOSIS — R5383 Other fatigue: Secondary | ICD-10-CM | POA: Insufficient documentation

## 2012-09-02 DIAGNOSIS — R0602 Shortness of breath: Secondary | ICD-10-CM | POA: Insufficient documentation

## 2012-09-02 DIAGNOSIS — R0609 Other forms of dyspnea: Secondary | ICD-10-CM | POA: Insufficient documentation

## 2012-09-02 DIAGNOSIS — G4710 Hypersomnia, unspecified: Secondary | ICD-10-CM | POA: Insufficient documentation

## 2012-09-04 DIAGNOSIS — G4710 Hypersomnia, unspecified: Secondary | ICD-10-CM

## 2012-09-16 NOTE — Procedures (Signed)
NAMESHARONDA, [name]              ACCOUNT NO.:  [id number]  MEDICAL RECORD NO.:  [id number]          PATIENT TYPE:  OUT  LOCATION:  SLEEP CENTER                 FACILITY:  [facility]  PHYSICIAN:  Ranessa Kosta D. Maple Hudson, MD, FCCP, FACPDATE OF BIRTH:  03-17-71  DATE OF STUDY:  09/02/2012                           NOCTURNAL POLYSOMNOGRAM  REFERRING PHYSICIAN:  INDICATION FOR STUDY:  Hypersomnia with sleep apnea.  EPWORTH SLEEPINESS SCORE:  21/24, BMI 32, weight 190 pounds.  Height 65 inches, neck 14 inches.  MEDICATIONS:  Home medications were not charted.  The patient did not bring a medicine list.  SLEEP ARCHITECTURE:  Total sleep time 266 minutes with sleep efficiency 71.8%.  Stage I was 6.6%, stage II was 79.7%,  stage III absent, REM 13.7% of total sleep time.  Sleep latency 27 minutes, REM latency 228 minutes.  Awake after sleep onset 77.5 minutes.  Arousal index 4.5.  BEDTIME MEDICATION:  None.  RESPIRATORY DATA:  Apnea-hypopnea index (AHI) 1.4 per hour.  A total of 6 events was scored including 1 obstructive apnea, 5 hypopneas.  Events were all associated with non-supine sleep position and REM.  REM AHI 4.9 per hour.  There were not enough events to meet protocol requirements for CPAP titration.  OXYGEN DATA:  Loud snoring with oxygen desaturation to a nadir of 91% and mean oxygen saturation through the study is 98% on room air.  CARDIAC DATA:  Normal sinus rhythm.  MOVEMENT-PARASOMNIA:  No significant movement disturbance.  Bathroom x1. The patient did not sleep supine because of chronic back pain.  IMPRESSIONS-RECOMMENDATIONS:  Occasional respiratory event with sleep disturbance, within normal limits.  Apnea-hypopnea index 1.4 per hour (the normal range for adults is from 0-5 per hour).  Loud snoring with oxygen desaturation to a nadir of 91% and mean oxygen saturation through the study of 98% on room air.    Debara Kamphuis D. Maple Hudson, MD, [facility], FACP Diplomate, American Board  of Sleep Medicine   CDY/MEDQ  D:  09/04/2012 13:36:34  T:  09/04/2012 17:55:30  Job:  [id number]

## 2012-09-19 ENCOUNTER — Inpatient Hospital Stay: Admission: RE | Admit: 2012-09-19 | Payer: Self-pay | Source: Ambulatory Visit

## 2012-12-30 ENCOUNTER — Ambulatory Visit
Admission: RE | Admit: 2012-12-30 | Discharge: 2012-12-30 | Disposition: A | Discharge status: Home / Self Care | Payer: Medicare HMO | Source: Ambulatory Visit | Attending: Family Medicine | Admitting: Family Medicine

## 2012-12-30 ENCOUNTER — Ambulatory Visit: Payer: Self-pay

## 2012-12-30 DIAGNOSIS — Z1231 Encounter for screening mammogram for malignant neoplasm of breast: Secondary | ICD-10-CM

## 2013-03-27 ENCOUNTER — Encounter: Payer: Self-pay | Admitting: Neurology

## 2013-03-27 ENCOUNTER — Ambulatory Visit: Payer: Medicare HMO | Admitting: Neurology

## 2013-04-04 ENCOUNTER — Ambulatory Visit (INDEPENDENT_AMBULATORY_CARE_PROVIDER_SITE_OTHER): Payer: Medicare HMO | Admitting: Neurology

## 2013-04-04 VITALS — BP 145/78 | HR 109 | Ht 65.0 in | Wt 209.0 lb

## 2013-04-04 DIAGNOSIS — G4700 Insomnia, unspecified: Secondary | ICD-10-CM

## 2013-04-04 DIAGNOSIS — E1149 Type 2 diabetes mellitus with other diabetic neurological complication: Secondary | ICD-10-CM

## 2013-04-04 DIAGNOSIS — E1142 Type 2 diabetes mellitus with diabetic polyneuropathy: Secondary | ICD-10-CM

## 2013-04-04 DIAGNOSIS — G43909 Migraine, unspecified, not intractable, without status migrainosus: Secondary | ICD-10-CM

## 2013-04-04 DIAGNOSIS — E1140 Type 2 diabetes mellitus with diabetic neuropathy, unspecified: Secondary | ICD-10-CM

## 2013-04-04 DIAGNOSIS — E119 Type 2 diabetes mellitus without complications: Secondary | ICD-10-CM

## 2013-04-04 DIAGNOSIS — F605 Obsessive-compulsive personality disorder: Secondary | ICD-10-CM

## 2013-04-04 MED ORDER — TIZANIDINE HCL 4 MG PO TABS: 4.0000 mg | ORAL_TABLET | Freq: Four times a day (QID) | ORAL | Status: DC | PRN

## 2013-04-04 NOTE — Progress Notes (Addendum)
[facility]  PATIENT: [name] DOB: [birth year]  HISTORICAL  [name] is a 42 yo RH WF, was referred by her primary care physician Dr. Nathanial Rancher for evaluation of diffuse body aching pain  She had a past medical history of hypertension, diabetes, depression, anxiety, fibromyalgia, she complains of pain from neck down, all over, she has difficulty walking because of muscle pain, she also complains of bilateral hands paresthesia, sometimes she could not feel her feet,  It hard to walk because of the pain,, when she takes a shower, the water hitting her body she felt the skin pain, massage has been helpful,  .   Laboratory evaluation showed mild anemia, hemoglobin of 10, low ferritin 11,  MRI of the brain in March 13 2013 was essentially normal  REVIEW OF SYSTEMS: Full 14 system review of systems performed and notable only for weight gain, weight loss, fatigue, blurry vision, shortness of breath, anemia, easy bruising, achy muscles   ALLERGIES: Allergies  Allergen Reactions  . Imitrex [Sumatriptan] Nausea Only    HOME MEDICATIONS: Outpatient Prescriptions Prior to Visit  Medication Sig Dispense Refill  . ACCU-CHEK AVIVA PLUS test strip       . albuterol (PROVENTIL HFA;VENTOLIN HFA) 108 (90 BASE) MCG/ACT inhaler Inhale 2 puffs into the lungs every 6 (six) hours as needed. WHEEZING       . Alcohol Swabs (PHARMACIST CHOICE ALCOHOL) PADS       . amoxicillin-clavulanate (AUGMENTIN) 875-125 MG per tablet Take 125 tablets by mouth daily.      . Biotin 1000 MCG tablet Take 1,000 mcg by mouth daily.      . Blood Glucose Monitoring Suppl (ACCU-CHEK AVIVA PLUS) W/DEVICE KIT       . buPROPion (WELLBUTRIN XL) 300 MG 24 hr tablet Take 300 mg by mouth daily.      . cephALEXin (KEFLEX) 500 MG capsule Take 500 mg by mouth daily.      . ferrous sulfate 325 (65 FE) MG tablet Take 1 tablet (325 mg total) by mouth daily with breakfast.  60 tablet  0  . fluconazole  (DIFLUCAN) 150 MG tablet Take 150 mg by mouth daily.      . fluticasone (FLONASE) 50 MCG/ACT nasal spray 50 sprays as directed.      Marland Kitchen FLUVIRIN PRESERVATIVE FREE SUSP injection       . furosemide (LASIX) 40 MG tablet Take 40 mg by mouth daily.      . hydrocortisone (ANUSOL-HC) 25 MG suppository Place 1 suppository (25 mg total) rectally 2 (two) times daily.  12 suppository  0  . insulin glargine (LANTUS) 100 UNIT/ML injection Inject 20 Units into the skin at bedtime. For control of blood sugar  10 mL  0  . Lancet Devices (SIMPLE DIAGNOSTICS LANCING DEV) MISC       . lidocaine (LIDODERM) 5 %       . lisinopril-hydrochlorothiazide (PRINZIDE,ZESTORETIC) 20-25 MG per tablet Take 1 tablet by mouth daily.      Marland Kitchen lubiprostone (AMITIZA) 24 MCG capsule Take 1 capsule (24 mcg total) by mouth 2 (two) times daily with a meal.  30 capsule  1  . montelukast (SINGULAIR) 10 MG tablet Take 10 mg by mouth at bedtime.      . ondansetron (ZOFRAN ODT) 8 MG disintegrating tablet Take 1 tablet (8 mg total) by mouth every 8 (eight) hours as needed for nausea.  10 tablet  0  . oxyCODONE-acetaminophen (PERCOCET/ROXICET) 5-325 MG per tablet Take  1 tablet by mouth every 8 (eight) hours as needed.  15 tablet  0  . pantoprazole (PROTONIX) 40 MG tablet Take 1 tablet (40 mg total) by mouth daily. For control of stomach acid secretion and helps GERD.  30 tablet  0  . PHARMACIST CHOICE LANCETS MISC       . polyethylene glycol (MIRALAX / GLYCOLAX) packet Take 17 g by mouth 2 (two) times daily.  14 each  1  . ranitidine (ZANTAC) 150 MG tablet Take 150 mg by mouth daily.      Marland Kitchen RELION PEN NEEDLES 29G X MISC       . traZODone (DESYREL) 50 MG tablet Take 50 mg by mouth at bedtime.      . vitamin B-12 (CYANOCOBALAMIN) 1000 MCG tablet Take 1 tablet (1,000 mcg total) by mouth daily. For nutritional supplementation.  30 tablet  0  . VOLTAREN 1 % GEL       . citalopram (CELEXA) 20 MG tablet Take 1 tablet (20 mg total) by mouth daily.  For depression.  30 tablet  0  . DULoxetine (CYMBALTA) 20 MG capsule Take 1 capsule (20 mg total) by mouth 2 (two) times daily. For depression and pain management.  30 capsule  0  . pregabalin (LYRICA) 75 MG capsule Take 1 capsule (75 mg total) by mouth 3 (three) times daily. neuropathic pain management  90 capsule  0  . amitriptyline (ELAVIL) 25 MG tablet Take 1 tablet (25 mg total) by mouth at bedtime. For pain management, depression, and insomnia.  30 tablet  0  . calcium-vitamin D (OSCAL WITH D) 500-200 MG-UNIT per tablet Take 1 tablet by mouth daily.       No facility-administered medications prior to visit.    PAST MEDICAL HISTORY: Past Medical History  Diagnosis Date  . Arthritis   . Asthma   . GERD (gastroesophageal reflux disease)   . Diabetes mellitus   . Hyperlipidemia   . Migraines   . Hypertension   . Back pain, chronic   . Depression   . Neuropathy   . Anxiety   . Nervous breakdown sept 2013  . Fibromyalgia   . History of gastric bypass Jan 2013, she lost 95 Lb.  . Insomnia     PAST SURGICAL HISTORY: Past Surgical History  Procedure Laterality Date  . Back surgery  November 1992  . Cesarean section  December 1998  . Tubal ligation  January 1999  . Cholecystectomy  1998  . Gastric roux-en-y  06/30/2011    Procedure: LAPAROSCOPIC ROUX-EN-Y GASTRIC;  Surgeon: Mariella Saa, MD;  Location: [facility];  Service: General;  Laterality: N/A;     . Esophagogastroduodenoscopy  07/27/2011    Procedure: ESOPHAGOGASTRODUODENOSCOPY (EGD);  Surgeon: Freddy Jaksch, MD;  Location: [facility];  Service: Endoscopy;  Laterality: N/A;    FAMILY HISTORY: Family History  Problem Relation Age of Onset  . Diabetes Maternal Aunt   . Diabetes Paternal Aunt   . Diabetes Paternal Grandfather     SOCIAL HISTORY:  History   Social History  . Marital Status: Single    Spouse Name: N/A    Number of Children: N/A  . Years of Education: N/A   Occupational History  . Not  on file.   Social History Main Topics  . Smoking status: Never Smoker   . Smokeless tobacco: Never Used  . Alcohol Use: No  . Drug Use: No  . Sexual Activity: No    PHYSICAL  EXAM   Filed Vitals:   04/04/13 0953  BP: 145/78  Pulse: 109  Height: 5\' 5"  (1.651 m)  Weight: 209 lb (94.802 kg)   Body mass index is 34.78 kg/(m^2).   Generalized: In no acute distress  Neck: Supple, no carotid bruits   Cardiac: Regular rate rhythm  Pulmonary: Clear to auscultation bilaterally  Musculoskeletal: No deformity  Neurological examination  Mentation: Alert oriented to time, place, history taking, and causual conversation  Cranial nerve II-XII: Pupils were equal round reactive to light extraocular movements were full, visual field were full on confrontational test. facial sensation and strength were normal. hearing was intact to finger rubbing bilaterally. Uvula tongue midline.  head turning and shoulder shrug and were normal and symmetric.Tongue protrusion into cheek strength was normal.  Motor: normal tone, bulk and strength.  Sensory: Intact to fine touch, pinprick, preserved vibratory sensation, and proprioception at toes.  Coordination: Normal finger to nose, heel-to-shin bilaterally there was no truncal ataxia  Gait: Rising up from seated position without assistance, normal stance, without trunk ataxia, moderate stride, good arm swing, smooth turning, able to perform tiptoe, and heel walking without difficulty.   Romberg signs: Negative  Deep tendon reflexes: Brachioradialis 2/2, biceps 2/2, triceps 2/2, patellar 2/2, Achilles 2/2, plantar responses were flexor bilaterally.   DIAGNOSTIC DATA (LABS, IMAGING, TESTING) - I reviewed patient records, labs, notes, testing and imaging myself where available.  Lab Results  Component Value Date   WBC 11.3* 04/25/2012   HGB 9.7* 04/25/2012   HCT 30.3* 04/25/2012   MCV 78.3 04/25/2012   PLT 324 04/25/2012      Component Value  Date/Time   NA 135 04/25/2012 1220   K 3.7 04/25/2012 1220   CL 103 04/25/2012 1220   CO2 21 04/25/2012 1220   GLUCOSE 252* 04/25/2012 1220   BUN 11 04/25/2012 1220   CREATININE 0.55 04/25/2012 1220   CREATININE 0.76 03/20/2011 1302   CALCIUM 8.8 04/25/2012 1220   PROT 6.4 04/25/2012 1220   ALBUMIN 2.9* 04/25/2012 1220   AST 83* 04/25/2012 1220   ALT 41* 04/25/2012 1220   ALKPHOS 144* 04/25/2012 1220   BILITOT 0.3 04/25/2012 1220   GFRNONAA >90 04/25/2012 1220   GFRAA >90 04/25/2012 1220   Lab Results  Component Value Date   CHOL 204* 03/20/2011   HDL 64 03/20/2011   LDLCALC 117* 03/20/2011   TRIG 114 03/20/2011   CHOLHDL 3.2 03/20/2011   Lab Results  Component Value Date   HGBA1C 7.9* 03/13/2012   Lab Results  Component Value Date   VITAMINB12 >2000* 03/13/2012   Lab Results  Component Value Date   TSH 0.553 03/13/2012    ASSESSMENT AND PLAN   42 years old female, with history of fibromyalgia,  now presenting with diffuse body aching pain, essentially normal neurological exam.   Most consistent with a diagnosis of fibromyalgia, differentiation diagnosis also including inflammatory myopathy  1. I will proceed with laboratory evaluation, including inflammatory markers 2.  EMG nerve conduction study 3. Refer to physical therapy.       Levert Feinstein, M.D. Ph.D.  [facility] [address], [address] [place], [place] [address] [phone]

## 2013-04-05 ENCOUNTER — Encounter: Payer: Self-pay | Admitting: Neurology

## 2013-04-12 ENCOUNTER — Ambulatory Visit (INDEPENDENT_AMBULATORY_CARE_PROVIDER_SITE_OTHER): Payer: Medicare HMO | Admitting: Neurology

## 2013-04-12 ENCOUNTER — Encounter (INDEPENDENT_AMBULATORY_CARE_PROVIDER_SITE_OTHER): Payer: Medicare HMO | Admitting: Radiology

## 2013-04-12 DIAGNOSIS — D649 Anemia, unspecified: Secondary | ICD-10-CM

## 2013-04-12 DIAGNOSIS — F605 Obsessive-compulsive personality disorder: Secondary | ICD-10-CM

## 2013-04-12 DIAGNOSIS — E119 Type 2 diabetes mellitus without complications: Secondary | ICD-10-CM

## 2013-04-12 DIAGNOSIS — G43909 Migraine, unspecified, not intractable, without status migrainosus: Secondary | ICD-10-CM

## 2013-04-12 DIAGNOSIS — Z0289 Encounter for other administrative examinations: Secondary | ICD-10-CM

## 2013-04-12 DIAGNOSIS — R209 Unspecified disturbances of skin sensation: Secondary | ICD-10-CM

## 2013-04-12 DIAGNOSIS — E1149 Type 2 diabetes mellitus with other diabetic neurological complication: Secondary | ICD-10-CM

## 2013-04-12 DIAGNOSIS — E1140 Type 2 diabetes mellitus with diabetic neuropathy, unspecified: Secondary | ICD-10-CM

## 2013-04-12 DIAGNOSIS — G4700 Insomnia, unspecified: Secondary | ICD-10-CM

## 2013-04-12 DIAGNOSIS — E1142 Type 2 diabetes mellitus with diabetic polyneuropathy: Secondary | ICD-10-CM

## 2013-04-12 DIAGNOSIS — F419 Anxiety disorder, unspecified: Secondary | ICD-10-CM

## 2013-04-12 NOTE — Procedures (Signed)
    [facility]  NCS (NERVE CONDUCTION STUDY) WITH EMG (ELECTROMYOGRAPHY) REPORT   STUDY DATE: 04/12/2013 PATIENT NAME: [name] DOB: [birth year] MRN: [id number]    TECHNOLOGIST: Kaylyn Lim ELECTROMYOGRAPHER: Levert Feinstein M.D.  CLINICAL INFORMATION: 42 years old right-handed [place] American female, with a long-standing history of diabetes, peripheral neuropathy, now presenting with bilateral feet, hands paresthesia, and also diffuse body aching pain   On examination: Bilateral upper and lower extremity motor strength was normal, there was decreased to light-touch and pinprick to below knee level. She has hyporeflexia, with absent ankle reflexes.  FINDINGS: NERVE CONDUCTION STUDY: Bilateral sural sensory responses were absent. Bilateral peroneal to EDB motor responses were normal. Bilateral tibial motor responses showed moderately decreased CMAP amplitude, with normal conduction velocity, and F wave latency.  Left ulnar sensory and motor responses were normal. Bilateral median sensory response was absent.  Bilateral median motor responses showed moderately prolonged distal latency, right worse than left, was normal conduction velocity, right side also has prolonged F-wave latency.   NEEDLE ELECTROMYOGRAPHY: Selected needle examination was performed at left upper, left lower extremity muscles, and left lumbosacral paraspinal muscles.  Needle examination of left tibialis anterior, left tibialis posterior, medial gastrocnemius, vastus lateralis was normal.  There was a well-healed midline lumbar scar, there was 2 plus spontaneous activity at left L3, increased insertion activity at left L4, L5.  The increased spontaneous activities are likely related to his previous surgery.  Needle examination of left biceps, triceps, deltoid, extensor digitorum communis was normal.  Needle examination of left abductor pollicis brevis, increased insertion activity, no spontaneous  activity, mildly enlarged motor unit potential, with mildly decreased recruitment patterns    IMPRESSION:  This is an abnormal study. There is electrodiagnostic evidence of length dependent peripheral neuropathy, there is also evidence of moderate bilateral carpal tunnel syndromes, right worse than left. There is no evidence of inflammatory myopathy, or left lumbar radiculopathy    INTERPRETING PHYSICIAN:   Levert Feinstein M.D. Ph.D. [facility] [address], [address] [place], [place] [address] [phone]

## 2013-04-13 ENCOUNTER — Ambulatory Visit: Payer: Medicare HMO | Attending: Neurology | Admitting: Rehabilitative and Restorative Service Providers"

## 2013-04-13 ENCOUNTER — Telehealth: Payer: Self-pay | Admitting: Neurology

## 2013-04-13 DIAGNOSIS — IMO0001 Reserved for inherently not codable concepts without codable children: Secondary | ICD-10-CM | POA: Insufficient documentation

## 2013-04-13 DIAGNOSIS — M549 Dorsalgia, unspecified: Secondary | ICD-10-CM | POA: Insufficient documentation

## 2013-04-13 DIAGNOSIS — R269 Unspecified abnormalities of gait and mobility: Secondary | ICD-10-CM | POA: Insufficient documentation

## 2013-04-13 DIAGNOSIS — M6281 Muscle weakness (generalized): Secondary | ICD-10-CM | POA: Insufficient documentation

## 2013-04-13 LAB — CBC
MCH: 23.5 pg — ABNORMAL LOW (ref 26.6–33.0)
MCHC: 32.1 g/dL (ref 31.5–35.7)
Platelets: 343 10*3/uL (ref 150–379)
RBC: 3.96 x10E6/uL (ref 3.77–5.28)
RDW: 17.8 % — ABNORMAL HIGH (ref 12.3–15.4)
WBC: 8.9 10*3/uL (ref 3.4–10.8)

## 2013-04-13 LAB — COMPREHENSIVE METABOLIC PANEL
ALT: 104 IU/L — ABNORMAL HIGH (ref 0–32)
AST: 160 IU/L — ABNORMAL HIGH (ref 0–40)
Albumin/Globulin Ratio: 1.1 (ref 1.1–2.5)
Alkaline Phosphatase: 231 IU/L — ABNORMAL HIGH (ref 39–117)
BUN/Creatinine Ratio: 18 (ref 9–23)
BUN: 12 mg/dL (ref 6–24)
CO2: 23 mmol/L (ref 18–29)
Chloride: 102 mmol/L (ref 97–108)
GFR calc Af Amer: 128 mL/min/{1.73_m2} (ref >59)
Glucose: 94 mg/dL (ref 65–99)
Potassium: 4.3 mmol/L (ref 3.5–5.2)
Sodium: 141 mmol/L (ref 134–144)
Total Bilirubin: <0.2 mg/dL (ref 0.0–1.2)
Total Protein: 6.8 g/dL (ref 6.0–8.5)

## 2013-04-13 LAB — CK: Total CK: 231 U/L — ABNORMAL HIGH (ref 24–173)

## 2013-04-13 LAB — HGB A1C W/O EAG: Hgb A1c MFr Bld: 8.3 % — ABNORMAL HIGH (ref 4.8–5.6)

## 2013-04-13 LAB — THYROID PANEL WITH TSH
Free Thyroxine Index: 1.8 (ref 1.2–4.9)
T3 Uptake Ratio: 30 % (ref 24–39)
T4, Total: 5.9 ug/dL (ref 4.5–12.0)

## 2013-04-13 LAB — C-REACTIVE PROTEIN: CRP: 11.2 mg/L — ABNORMAL HIGH (ref 0.0–4.9)

## 2013-04-13 LAB — ANA: Anti Nuclear Antibody(ANA): NEGATIVE

## 2013-04-13 LAB — VITAMIN B12: Vitamin B-12: 672 pg/mL (ref 211–946)

## 2013-04-13 LAB — RPR: RPR: NONREACTIVE

## 2013-04-13 NOTE — Telephone Encounter (Signed)
Dana:  Please connect me with her primary care physician Dr. Kela Millin, I need to discuss with him about her treatment plan,  Elevated A1c 8.3, history of DM, now with diffuse body achy pain, elevated CRP, ESR, in the setting of anemia, mild elevated CPK, could indicate polymalgia rheumatica,

## 2013-04-14 NOTE — Telephone Encounter (Signed)
Called Dr. Nathanial Rancher office and they are closed today. 04/14/2013 . I will try back on Monday for Dr.Yan for physician to physicians call. [phone]

## 2013-04-21 ENCOUNTER — Ambulatory Visit: Payer: Medicare HMO | Admitting: Physical Therapy

## 2013-04-22 ENCOUNTER — Emergency Department (HOSPITAL_COMMUNITY)
Admission: EM | Admit: 2013-04-22 | Discharge: 2013-04-22 | Disposition: A | Discharge status: Home / Self Care | Payer: Medicare HMO | Attending: Emergency Medicine | Admitting: Emergency Medicine

## 2013-04-22 ENCOUNTER — Encounter (HOSPITAL_COMMUNITY): Payer: Self-pay | Admitting: Emergency Medicine

## 2013-04-22 DIAGNOSIS — J45909 Unspecified asthma, uncomplicated: Secondary | ICD-10-CM | POA: Insufficient documentation

## 2013-04-22 DIAGNOSIS — E119 Type 2 diabetes mellitus without complications: Secondary | ICD-10-CM | POA: Insufficient documentation

## 2013-04-22 DIAGNOSIS — Z8739 Personal history of other diseases of the musculoskeletal system and connective tissue: Secondary | ICD-10-CM | POA: Insufficient documentation

## 2013-04-22 DIAGNOSIS — F3289 Other specified depressive episodes: Secondary | ICD-10-CM | POA: Insufficient documentation

## 2013-04-22 DIAGNOSIS — E669 Obesity, unspecified: Secondary | ICD-10-CM | POA: Insufficient documentation

## 2013-04-22 DIAGNOSIS — I10 Essential (primary) hypertension: Secondary | ICD-10-CM | POA: Insufficient documentation

## 2013-04-22 DIAGNOSIS — Z794 Long term (current) use of insulin: Secondary | ICD-10-CM | POA: Insufficient documentation

## 2013-04-22 DIAGNOSIS — N61 Mastitis without abscess: Secondary | ICD-10-CM | POA: Insufficient documentation

## 2013-04-22 DIAGNOSIS — IMO0002 Reserved for concepts with insufficient information to code with codable children: Secondary | ICD-10-CM | POA: Insufficient documentation

## 2013-04-22 DIAGNOSIS — F329 Major depressive disorder, single episode, unspecified: Secondary | ICD-10-CM | POA: Insufficient documentation

## 2013-04-22 DIAGNOSIS — K219 Gastro-esophageal reflux disease without esophagitis: Secondary | ICD-10-CM | POA: Insufficient documentation

## 2013-04-22 DIAGNOSIS — L02219 Cutaneous abscess of trunk, unspecified: Secondary | ICD-10-CM | POA: Insufficient documentation

## 2013-04-22 DIAGNOSIS — F411 Generalized anxiety disorder: Secondary | ICD-10-CM | POA: Insufficient documentation

## 2013-04-22 DIAGNOSIS — Z8669 Personal history of other diseases of the nervous system and sense organs: Secondary | ICD-10-CM | POA: Insufficient documentation

## 2013-04-22 DIAGNOSIS — L02211 Cutaneous abscess of abdominal wall: Secondary | ICD-10-CM

## 2013-04-22 DIAGNOSIS — N611 Abscess of the breast and nipple: Secondary | ICD-10-CM

## 2013-04-22 DIAGNOSIS — G8929 Other chronic pain: Secondary | ICD-10-CM | POA: Insufficient documentation

## 2013-04-22 DIAGNOSIS — Z792 Long term (current) use of antibiotics: Secondary | ICD-10-CM | POA: Insufficient documentation

## 2013-04-22 DIAGNOSIS — Z79899 Other long term (current) drug therapy: Secondary | ICD-10-CM | POA: Insufficient documentation

## 2013-04-22 MED ORDER — SULFAMETHOXAZOLE-TRIMETHOPRIM 800-160 MG PO TABS: 2.0000 | ORAL_TABLET | Freq: Two times a day (BID) | ORAL | Status: DC

## 2013-04-22 MED ORDER — HYDROCODONE-ACETAMINOPHEN 5-325 MG PO TABS: 1.0000 | ORAL_TABLET | Freq: Four times a day (QID) | ORAL | Status: DC | PRN

## 2013-04-22 NOTE — ED Provider Notes (Signed)
CSN: [id number]     Arrival date & time 04/22/13  1713 History   First MD Initiated Contact with Patient 04/22/13 1735     Chief Complaint  Patient presents with  . Abscess   HPI  Ms Delberta Folts is a 42 yo obese female with a hx of DM who complains of two abscesses that are itching. Patient noticed the first spot on Monday around her epigastric area. On Wednesday, she developed another spot under her right breast. Both areas are very tender and itchy, and extends over her entire right breast. No numbness or tingling in the area. No fevers, chills, nausea, vomiting. Patient has not noticed any discharge from either area. She has tried some OTC anti-itch cream with no relief. Wearing her bra and tight clothing aggravates her pain. Pain is 'agonizing'  Past Medical History  Diagnosis Date  . Arthritis   . Asthma   . GERD (gastroesophageal reflux disease)   . Diabetes mellitus   . Hyperlipidemia   . Migraines   . Hypertension   . Back pain, chronic   . Depression   . Neuropathy   . Anxiety   . Nervous breakdown sept 2013  . Fibromyalgia   . History of gastric bypass Jan 2013  . Insomnia    Past Surgical History  Procedure Laterality Date  . Back surgery  November 1992  . Cesarean section  December 1998  . Tubal ligation  January 1999  . Cholecystectomy  1998  . Gastric roux-en-y  06/30/2011    Procedure: LAPAROSCOPIC ROUX-EN-Y GASTRIC;  Surgeon: Mariella Saa, MD;  Location: [facility];  Service: General;  Laterality: N/A;     . Esophagogastroduodenoscopy  07/27/2011    Procedure: ESOPHAGOGASTRODUODENOSCOPY (EGD);  Surgeon: Freddy Jaksch, MD;  Location: [facility];  Service: Endoscopy;  Laterality: N/A;   Family History  Problem Relation Age of Onset  . Diabetes Maternal Aunt   . Diabetes Paternal Aunt   . Diabetes Paternal Grandfather    History  Substance Use Topics  . Smoking status: Never Smoker   . Smokeless tobacco: Never Used  . Alcohol Use: No   OB  History   Grav Para Term Preterm Abortions TAB SAB Ect Mult Living                 Review of Systems All other systems negative except as documented in the HPI. All pertinent positives and negatives as reviewed in the HPI.  Allergies  Imitrex  Home Medications   Current Outpatient Rx  Name  Route  Sig  Dispense  Refill  . ACCU-CHEK AVIVA PLUS test strip               . albuterol (PROVENTIL HFA;VENTOLIN HFA) 108 (90 BASE) MCG/ACT inhaler   Inhalation   Inhale 2 puffs into the lungs every 6 (six) hours as needed. WHEEZING          . Alcohol Swabs (PHARMACIST CHOICE ALCOHOL) PADS               . amoxicillin-clavulanate (AUGMENTIN) 875-125 MG per tablet   Oral   Take 125 tablets by mouth daily.         . Biotin 1000 MCG tablet   Oral   Take 1,000 mcg by mouth daily.         . Blood Glucose Monitoring Suppl (ACCU-CHEK AVIVA PLUS) W/DEVICE KIT               . buPROPion (  WELLBUTRIN XL) 300 MG 24 hr tablet   Oral   Take 300 mg by mouth daily.         . cephALEXin (KEFLEX) 500 MG capsule   Oral   Take 500 mg by mouth daily.         Marland Kitchen EXPIRED: citalopram (CELEXA) 20 MG tablet   Oral   Take 1 tablet (20 mg total) by mouth daily. For depression.   30 tablet   0   . EXPIRED: DULoxetine (CYMBALTA) 20 MG capsule   Oral   Take 1 capsule (20 mg total) by mouth 2 (two) times daily. For depression and pain management.   30 capsule   0   . ferrous sulfate 325 (65 FE) MG tablet   Oral   Take 1 tablet (325 mg total) by mouth daily with breakfast.   60 tablet   0   . fluconazole (DIFLUCAN) 150 MG tablet   Oral   Take 150 mg by mouth daily.         . fluticasone (FLONASE) 50 MCG/ACT nasal spray      50 sprays as directed.         Marland Kitchen FLUVIRIN PRESERVATIVE FREE SUSP injection               . furosemide (LASIX) 40 MG tablet   Oral   Take 40 mg by mouth daily.         . hydrocortisone (ANUSOL-HC) 25 MG suppository   Rectal   Place 1  suppository (25 mg total) rectally 2 (two) times daily.   12 suppository   0   . insulin glargine (LANTUS) 100 UNIT/ML injection   Subcutaneous   Inject 20 Units into the skin at bedtime. For control of blood sugar   10 mL   0   . Lancet Devices (SIMPLE DIAGNOSTICS LANCING DEV) MISC               . lidocaine (LIDODERM) 5 %               . lisinopril (PRINIVIL,ZESTRIL) 20 MG tablet   Oral   Take 20 mg by mouth daily.         Marland Kitchen lisinopril-hydrochlorothiazide (PRINZIDE,ZESTORETIC) 20-25 MG per tablet   Oral   Take 1 tablet by mouth daily.         Marland Kitchen lubiprostone (AMITIZA) 24 MCG capsule   Oral   Take 1 capsule (24 mcg total) by mouth 2 (two) times daily with a meal.   30 capsule   1   . montelukast (SINGULAIR) 10 MG tablet   Oral   Take 10 mg by mouth at bedtime.         . ondansetron (ZOFRAN ODT) 8 MG disintegrating tablet   Oral   Take 1 tablet (8 mg total) by mouth every 8 (eight) hours as needed for nausea.   10 tablet   0   . OPANA ER, CRUSH RESISTANT, 5 MG T12A      5 mg daily.         Marland Kitchen oxyCODONE-acetaminophen (PERCOCET/ROXICET) 5-325 MG per tablet   Oral   Take 1 tablet by mouth every 8 (eight) hours as needed.   15 tablet   0   . pantoprazole (PROTONIX) 40 MG tablet   Oral   Take 1 tablet (40 mg total) by mouth daily. For control of stomach acid secretion and helps GERD.   30 tablet   0   .  PHARMACIST CHOICE LANCETS MISC               . polyethylene glycol (MIRALAX / GLYCOLAX) packet   Oral   Take 17 g by mouth 2 (two) times daily.   14 each   1   . EXPIRED: pregabalin (LYRICA) 75 MG capsule   Oral   Take 1 capsule (75 mg total) by mouth 3 (three) times daily. neuropathic pain management   90 capsule   0   . ranitidine (ZANTAC) 150 MG tablet   Oral   Take 150 mg by mouth daily.         Marland Kitchen RELION PEN NEEDLES 29G X MISC               . tiZANidine (ZANAFLEX) 4 MG tablet   Oral   Take 1 tablet (4 mg total) by  mouth every 6 (six) hours as needed.   90 tablet   6   . traZODone (DESYREL) 50 MG tablet   Oral   Take 50 mg by mouth at bedtime.         . vitamin B-12 (CYANOCOBALAMIN) 1000 MCG tablet   Oral   Take 1 tablet (1,000 mcg total) by mouth daily. For nutritional supplementation.   30 tablet   0   . VOLTAREN 1 % GEL                BP 154/83  Pulse 98  Temp(Src) 99.3 F (37.4 C) (Oral)  Resp 16  SpO2 100% Physical Exam  Constitutional: She appears well-developed and well-nourished. No distress.  Cardiovascular: Normal rate, regular rhythm and normal heart sounds.   Pulmonary/Chest: Effort normal and breath sounds normal. Right breast exhibits skin change (1 cm hard lesion under right breast, excoriations noted, white scaly material in center of lesion, no errythema noted). Right breast exhibits no inverted nipple, no mass and no nipple discharge. Left breast exhibits skin change (dry, scaly skin under left breast). Left breast exhibits no inverted nipple, no mass and no nipple discharge. Breasts are symmetrical.    Abdominal:      ED Course  Procedures (including critical care time)  INCISION AND DRAINAGE Performed by: Carlyle Dolly Consent: Verbal consent obtained. Risks and benefits: risks, benefits and alternatives were discussed Type: abscess  Body area: R Beast medially and L abdominal wall  Anesthesia: local infiltration  Incision was made with a scalpel.  Local anesthetic: lidocaine 2% w epinephrine  Anesthetic total: 8 ml  Complexity: complex Blunt dissection to break up loculations  Drainage: purulent  Drainage amount: moderate  Packing material: 1/4 in iodoform gauze  Patient tolerance: Patient tolerated the procedure well with no immediate complications.      Carlyle Dolly, PA-C 04/23/13 1448

## 2013-04-22 NOTE — ED Notes (Signed)
Pt states she has "2 boils" on her abdomen. One of them is under her R breast and the other is on her lower abdomen. Pt states they have been there about a week . Pt states she is diabetic. Pt with no acute distress.

## 2013-04-22 NOTE — ED Notes (Signed)
Gauze and paper tape placed on wounds. Pt instructed on further wound care.

## 2013-04-23 NOTE — ED Provider Notes (Signed)
Medical screening examination/treatment/procedure(s) were performed by non-physician practitioner and as supervising physician I was immediately available for consultation/collaboration.  EKG Interpretation   None         Audree Camel, MD 04/23/13 [phone]

## 2013-04-25 ENCOUNTER — Ambulatory Visit: Payer: Self-pay | Admitting: Physical Therapy

## 2013-04-28 ENCOUNTER — Ambulatory Visit: Payer: Medicare HMO | Attending: Neurology | Admitting: Rehabilitative and Restorative Service Providers"

## 2013-04-28 DIAGNOSIS — R269 Unspecified abnormalities of gait and mobility: Secondary | ICD-10-CM | POA: Insufficient documentation

## 2013-04-28 DIAGNOSIS — IMO0001 Reserved for inherently not codable concepts without codable children: Secondary | ICD-10-CM | POA: Insufficient documentation

## 2013-04-28 DIAGNOSIS — M549 Dorsalgia, unspecified: Secondary | ICD-10-CM | POA: Insufficient documentation

## 2013-04-28 DIAGNOSIS — M6281 Muscle weakness (generalized): Secondary | ICD-10-CM | POA: Insufficient documentation

## 2013-05-02 ENCOUNTER — Ambulatory Visit: Payer: Medicare HMO | Admitting: Physical Therapy

## 2013-05-05 ENCOUNTER — Ambulatory Visit: Payer: Medicare HMO | Admitting: Physical Therapy

## 2013-05-08 ENCOUNTER — Ambulatory Visit: Payer: Medicare HMO | Admitting: Rehabilitative and Restorative Service Providers"

## 2013-05-09 ENCOUNTER — Ambulatory Visit: Payer: Self-pay | Admitting: Rehabilitative and Restorative Service Providers"

## 2013-05-10 ENCOUNTER — Ambulatory Visit: Payer: Medicare HMO | Admitting: Physical Therapy

## 2013-05-17 ENCOUNTER — Ambulatory Visit: Payer: Medicare HMO | Attending: Neurology | Admitting: Rehabilitative and Restorative Service Providers"

## 2013-05-17 DIAGNOSIS — R269 Unspecified abnormalities of gait and mobility: Secondary | ICD-10-CM | POA: Insufficient documentation

## 2013-05-17 DIAGNOSIS — M549 Dorsalgia, unspecified: Secondary | ICD-10-CM | POA: Insufficient documentation

## 2013-05-17 DIAGNOSIS — M6281 Muscle weakness (generalized): Secondary | ICD-10-CM | POA: Insufficient documentation

## 2013-05-17 DIAGNOSIS — IMO0001 Reserved for inherently not codable concepts without codable children: Secondary | ICD-10-CM | POA: Insufficient documentation

## 2013-05-19 ENCOUNTER — Ambulatory Visit: Payer: Medicare HMO | Admitting: Rehabilitative and Restorative Service Providers"

## 2013-06-04 ENCOUNTER — Encounter (HOSPITAL_COMMUNITY): Payer: Self-pay | Admitting: Emergency Medicine

## 2013-06-04 ENCOUNTER — Emergency Department (HOSPITAL_COMMUNITY): Payer: Medicare HMO

## 2013-06-04 ENCOUNTER — Emergency Department (HOSPITAL_COMMUNITY)
Admission: EM | Admit: 2013-06-04 | Discharge: 2013-06-04 | Disposition: A | Discharge status: Home / Self Care | Payer: Medicare HMO | Attending: Emergency Medicine | Admitting: Emergency Medicine

## 2013-06-04 DIAGNOSIS — F329 Major depressive disorder, single episode, unspecified: Secondary | ICD-10-CM | POA: Insufficient documentation

## 2013-06-04 DIAGNOSIS — Z794 Long term (current) use of insulin: Secondary | ICD-10-CM | POA: Insufficient documentation

## 2013-06-04 DIAGNOSIS — R42 Dizziness and giddiness: Secondary | ICD-10-CM | POA: Insufficient documentation

## 2013-06-04 DIAGNOSIS — Z3202 Encounter for pregnancy test, result negative: Secondary | ICD-10-CM | POA: Insufficient documentation

## 2013-06-04 DIAGNOSIS — F411 Generalized anxiety disorder: Secondary | ICD-10-CM | POA: Insufficient documentation

## 2013-06-04 DIAGNOSIS — J45909 Unspecified asthma, uncomplicated: Secondary | ICD-10-CM | POA: Insufficient documentation

## 2013-06-04 DIAGNOSIS — H53149 Visual discomfort, unspecified: Secondary | ICD-10-CM | POA: Insufficient documentation

## 2013-06-04 DIAGNOSIS — G43909 Migraine, unspecified, not intractable, without status migrainosus: Secondary | ICD-10-CM | POA: Insufficient documentation

## 2013-06-04 DIAGNOSIS — K219 Gastro-esophageal reflux disease without esophagitis: Secondary | ICD-10-CM | POA: Insufficient documentation

## 2013-06-04 DIAGNOSIS — F3289 Other specified depressive episodes: Secondary | ICD-10-CM | POA: Insufficient documentation

## 2013-06-04 DIAGNOSIS — I10 Essential (primary) hypertension: Secondary | ICD-10-CM | POA: Insufficient documentation

## 2013-06-04 DIAGNOSIS — IMO0002 Reserved for concepts with insufficient information to code with codable children: Secondary | ICD-10-CM | POA: Insufficient documentation

## 2013-06-04 DIAGNOSIS — M129 Arthropathy, unspecified: Secondary | ICD-10-CM | POA: Insufficient documentation

## 2013-06-04 DIAGNOSIS — Z9884 Bariatric surgery status: Secondary | ICD-10-CM | POA: Insufficient documentation

## 2013-06-04 DIAGNOSIS — IMO0001 Reserved for inherently not codable concepts without codable children: Secondary | ICD-10-CM | POA: Insufficient documentation

## 2013-06-04 DIAGNOSIS — Z79899 Other long term (current) drug therapy: Secondary | ICD-10-CM | POA: Insufficient documentation

## 2013-06-04 DIAGNOSIS — R51 Headache: Secondary | ICD-10-CM | POA: Insufficient documentation

## 2013-06-04 DIAGNOSIS — R197 Diarrhea, unspecified: Secondary | ICD-10-CM | POA: Insufficient documentation

## 2013-06-04 DIAGNOSIS — R5381 Other malaise: Secondary | ICD-10-CM | POA: Insufficient documentation

## 2013-06-04 DIAGNOSIS — R112 Nausea with vomiting, unspecified: Secondary | ICD-10-CM | POA: Insufficient documentation

## 2013-06-04 DIAGNOSIS — G8929 Other chronic pain: Secondary | ICD-10-CM | POA: Insufficient documentation

## 2013-06-04 DIAGNOSIS — E119 Type 2 diabetes mellitus without complications: Secondary | ICD-10-CM | POA: Insufficient documentation

## 2013-06-04 LAB — URINALYSIS, ROUTINE W REFLEX MICROSCOPIC
Glucose, UA: NEGATIVE mg/dL
Ketones, ur: NEGATIVE mg/dL
Leukocytes, UA: NEGATIVE
Protein, ur: NEGATIVE mg/dL
Specific Gravity, Urine: 1.008 (ref 1.005–1.030)
Urobilinogen, UA: 0.2 mg/dL (ref 0.0–1.0)

## 2013-06-04 LAB — CBC WITH DIFFERENTIAL/PLATELET
Basophils Absolute: 0 10*3/uL (ref 0.0–0.1)
Basophils Relative: 0 % (ref 0–1)
Eosinophils Absolute: 0.2 10*3/uL (ref 0.0–0.7)
Eosinophils Relative: 1 % (ref 0–5)
HCT: 31.6 % — ABNORMAL LOW (ref 36.0–46.0)
Lymphocytes Relative: 16 % (ref 12–46)
Lymphs Abs: 2.2 10*3/uL (ref 0.7–4.0)
MCV: 74.7 fL — ABNORMAL LOW (ref 78.0–100.0)
Monocytes Absolute: 0.9 10*3/uL (ref 0.1–1.0)
Monocytes Relative: 6 % (ref 3–12)
RDW: 15.7 % — ABNORMAL HIGH (ref 11.5–15.5)
WBC: 14.1 10*3/uL — ABNORMAL HIGH (ref 4.0–10.5)

## 2013-06-04 LAB — COMPREHENSIVE METABOLIC PANEL
ALT: 24 U/L (ref 0–35)
BUN: 13 mg/dL (ref 6–23)
CO2: 26 mEq/L (ref 19–32)
Calcium: 8.9 mg/dL (ref 8.4–10.5)
Creatinine, Ser: 0.71 mg/dL (ref 0.50–1.10)
GFR calc Af Amer: >90 mL/min (ref >90)
GFR calc non Af Amer: >90 mL/min (ref >90)
Glucose, Bld: 74 mg/dL (ref 70–99)
Sodium: 140 mEq/L (ref 135–145)

## 2013-06-04 LAB — POCT PREGNANCY, URINE: Preg Test, Ur: NEGATIVE

## 2013-06-04 LAB — LIPASE, BLOOD: Lipase: 22 U/L (ref 11–59)

## 2013-06-04 MED ORDER — DIPHENHYDRAMINE HCL 50 MG/ML IJ SOLN
25.0000 mg | Freq: Once | INTRAMUSCULAR | Status: AC
  Administered 2013-06-04: 25 mg via INTRAVENOUS
  Filled 2013-06-04: qty 1

## 2013-06-04 MED ORDER — ONDANSETRON HCL 4 MG PO TABS: 4.0000 mg | ORAL_TABLET | Freq: Three times a day (TID) | ORAL | Status: DC | PRN

## 2013-06-04 MED ORDER — KETOROLAC TROMETHAMINE 30 MG/ML IJ SOLN
30.0000 mg | Freq: Once | INTRAMUSCULAR | Status: AC
  Administered 2013-06-04: 30 mg via INTRAVENOUS
  Filled 2013-06-04: qty 1

## 2013-06-04 MED ORDER — METOCLOPRAMIDE HCL 5 MG/ML IJ SOLN
10.0000 mg | Freq: Once | INTRAMUSCULAR | Status: AC
  Administered 2013-06-04: 10 mg via INTRAVENOUS
  Filled 2013-06-04: qty 2

## 2013-06-04 MED ORDER — SODIUM CHLORIDE 0.9 % IV BOLUS (SEPSIS)
1000.0000 mL | Freq: Once | INTRAVENOUS | Status: AC
  Administered 2013-06-04: 1000 mL via INTRAVENOUS

## 2013-06-04 NOTE — ED Notes (Signed)
4 mg Zofran given for nausea Per EMS-#[id number]-[place] Pt c/o 2  Hx of N/V and headache Pain in back of head, photosensitive Pt alert, oriented at present

## 2013-06-04 NOTE — ED Notes (Signed)
Ambulated to bathroom. Pt needed assist due to photophobia.

## 2013-06-04 NOTE — ED Provider Notes (Signed)
CSN: [id number]     Arrival date & time 06/04/13  1437 History   First MD Initiated Contact with Patient 06/04/13 1536     Chief Complaint  Patient presents with  . Headache    x 3 days  . Nausea    x 2 days  . Emesis    vomiting  . Dizziness  . Diarrhea    x 2 days   (Consider location/radiation/quality/duration/timing/severity/associated sxs/prior Treatment) Patient is a 42 y.o. female presenting with headaches, vomiting, dizziness, and diarrhea. The history is provided by the patient. No language interpreter was used.  Headache Pain location:  Occipital Radiates to:  Does not radiate Severity currently:  10/10 Severity at highest:  10/10 Onset quality:  Gradual Duration:  2 days Timing:  Constant Progression:  Unchanged Chronicity:  Recurrent Similar to prior headaches: yes (but worse)   Context: bright light   Context: not activity, not caffeine, not coughing, not defecating, not eating, not stress, not exposure to cold air, not intercourse, not loud noise and not straining   Relieved by: opana, percocet at home. Worsened by:  Activity, light and sound Associated symptoms: diarrhea, dizziness, nausea, photophobia, vomiting and weakness   Associated symptoms: no abdominal pain, no back pain, no blurred vision, no congestion, no drainage, no ear pain, no pain, no facial pain, no fatigue, no fever, no focal weakness, no loss of balance, no myalgias, no near-syncope, no neck stiffness, no numbness, no seizures, no sinus pressure, no sore throat, no swollen glands, no syncope, no tingling, no URI and no visual change  Paresthesias: chronic BL paresthesia of hands.   Emesis Associated symptoms: diarrhea and headaches   Associated symptoms: no abdominal pain, no myalgias, no sore throat and no URI   Dizziness Associated symptoms: diarrhea, headaches, nausea, vomiting and weakness   Associated symptoms: no syncope and no vision changes   Diarrhea Associated symptoms: headaches  and vomiting   Associated symptoms: no abdominal pain, no fever, no myalgias and no URI     Past Medical History  Diagnosis Date  . Arthritis   . Asthma   . GERD (gastroesophageal reflux disease)   . Diabetes mellitus   . Hyperlipidemia   . Migraines   . Hypertension   . Back pain, chronic   . Depression   . Neuropathy   . Anxiety   . Nervous breakdown sept 2013  . Fibromyalgia   . History of gastric bypass Jan 2013  . Insomnia    Past Surgical History  Procedure Laterality Date  . Back surgery  November 1992  . Cesarean section  December 1998  . Tubal ligation  January 1999  . Cholecystectomy  1998  . Gastric roux-en-y  06/30/2011    Procedure: LAPAROSCOPIC ROUX-EN-Y GASTRIC;  Surgeon: Mariella Saa, MD;  Location: [facility];  Service: General;  Laterality: N/A;     . Esophagogastroduodenoscopy  07/27/2011    Procedure: ESOPHAGOGASTRODUODENOSCOPY (EGD);  Surgeon: Freddy Jaksch, MD;  Location: [facility];  Service: Endoscopy;  Laterality: N/A;   Family History  Problem Relation Age of Onset  . Diabetes Maternal Aunt   . Diabetes Paternal Aunt   . Diabetes Paternal Grandfather    History  Substance Use Topics  . Smoking status: Never Smoker   . Smokeless tobacco: Never Used  . Alcohol Use: No   OB History   Grav Para Term Preterm Abortions TAB SAB Ect Mult Living  Review of Systems  Constitutional: Negative for fever and fatigue.  HENT: Negative for congestion, ear pain, postnasal drip, sinus pressure and sore throat.   Eyes: Positive for photophobia. Negative for blurred vision and pain.  Cardiovascular: Negative for syncope and near-syncope.  Gastrointestinal: Positive for nausea, vomiting and diarrhea. Negative for abdominal pain.  Musculoskeletal: Negative for back pain, myalgias and neck stiffness.  Neurological: Positive for dizziness and headaches. Negative for focal weakness, seizures, numbness and loss of balance. Paresthesias:  chronic BL paresthesia of hands.    Allergies  Imitrex  Home Medications   Current Outpatient Rx  Name  Route  Sig  Dispense  Refill  . albuterol (PROVENTIL HFA;VENTOLIN HFA) 108 (90 BASE) MCG/ACT inhaler   Inhalation   Inhale 2 puffs into the lungs every 6 (six) hours as needed. WHEEZING          . Biotin 1000 MCG tablet   Oral   Take 1,000 mcg by mouth daily.         Marland Kitchen buPROPion (WELLBUTRIN XL) 300 MG 24 hr tablet   Oral   Take 300 mg by mouth daily.         . citalopram (CELEXA) 20 MG tablet   Oral   Take 1 tablet (20 mg total) by mouth daily. For depression.   30 tablet   0   . ferrous sulfate 325 (65 FE) MG tablet   Oral   Take 1 tablet (325 mg total) by mouth daily with breakfast.   60 tablet   0   . fluticasone (FLONASE) 50 MCG/ACT nasal spray      50 sprays as directed.         . furosemide (LASIX) 40 MG tablet   Oral   Take 40 mg by mouth daily.         . insulin glargine (LANTUS) 100 UNIT/ML injection   Subcutaneous   Inject 74 Units into the skin at bedtime. For control of blood sugar         . lidocaine (LIDODERM) 5 %   Transdermal   Place 1 patch onto the skin daily as needed (for back pain).          Marland Kitchen lisinopril (PRINIVIL,ZESTRIL) 20 MG tablet   Oral   Take 20 mg by mouth daily.         Marland Kitchen lubiprostone (AMITIZA) 24 MCG capsule   Oral   Take 1 capsule (24 mcg total) by mouth 2 (two) times daily with a meal.   30 capsule   1   . OPANA ER, CRUSH RESISTANT, 5 MG T12A      5 mg daily.         Marland Kitchen oxyCODONE-acetaminophen (PERCOCET) 10-325 MG per tablet   Oral   Take 1 tablet by mouth every 4 (four) hours as needed for pain.         . polyethylene glycol (MIRALAX / GLYCOLAX) packet   Oral   Take 17 g by mouth 2 (two) times daily.   14 each   1   . pregabalin (LYRICA) 75 MG capsule   Oral   Take 75 mg by mouth 3 (three) times daily. neuropathic pain management         . ranitidine (ZANTAC) 150 MG tablet   Oral    Take 150 mg by mouth daily.         Marland Kitchen tiZANidine (ZANAFLEX) 4 MG tablet   Oral   Take 4 mg by mouth  every 6 (six) hours as needed (for pain).         Marland Kitchen ACCU-CHEK AVIVA PLUS test strip               . Alcohol Swabs (PHARMACIST CHOICE ALCOHOL) PADS               . Blood Glucose Monitoring Suppl (ACCU-CHEK AVIVA PLUS) W/DEVICE KIT               . FLUVIRIN PRESERVATIVE FREE SUSP injection               . Lancet Devices (SIMPLE DIAGNOSTICS LANCING DEV) MISC               . PHARMACIST CHOICE LANCETS MISC               . RELION PEN NEEDLES 29G X MISC               . VOLTAREN 1 % GEL                BP 138/86  Pulse 97  Temp(Src) 98.3 F (36.8 C) (Oral)  Resp 14  SpO2 97%  LMP 05/17/2013 Physical Exam  Vitals reviewed. Constitutional: She is oriented to person, place, and time. She appears well-developed and well-nourished. No distress.  HENT:  Head: Normocephalic.  The membranes are dry  Eyes: Conjunctivae and EOM are normal. Pupils are equal, round, and reactive to light.  Neck: Normal range of motion. Neck supple.  No meningismus  Abdominal: Soft.  Musculoskeletal: Normal range of motion.  Lymphadenopathy:    She has no cervical adenopathy.  Neurological: She is alert and oriented to person, place, and time.  Speech is clear and goal oriented, follows commands Major Cranial nerves without deficit, no facial droop Normal strength in upper and lower extremities bilaterally including dorsiflexion and plantar flexion, strong and equal grip strength Sensation normal to light and sharp touch Moves extremities without ataxia, coordination intact Normal finger to nose and rapid alternating movements Neg romberg, no pronator drift Normal gait Normal heel-shin and balance   Psychiatric:  Patient is slurring her speech, denies taking any of her narcotics today.    ED Course  Procedures (including critical care time) Labs  Review Labs Reviewed  CBC WITH DIFFERENTIAL - Abnormal; Notable for the following:    WBC 14.1 (*)    Hemoglobin 9.9 (*)    HCT 31.6 (*)    MCV 74.7 (*)    MCH 23.4 (*)    RDW 15.7 (*)    Neutro Abs 10.8 (*)    All other components within normal limits  URINALYSIS, ROUTINE W REFLEX MICROSCOPIC  COMPREHENSIVE METABOLIC PANEL  LIPASE, BLOOD  POCT PREGNANCY, URINE   Imaging Review No results found.  EKG Interpretation   None       MDM   1. Headache    Patient with headache.  This is similar to her history of previous headaches. No fever. No meningismus. Slurred speech. Migraine cocktail. CT pending  CT-scan of the abdomen negative. Afebrile. H/A resolved with migraine cocktail. On repeat exam patient does no have signs of meningismus.repeat neuro exam reveals no abnormalities. The patient has a leukocytosis, however I feel this is due to a viral illness and not a meningitis. Patient is anemic but appears to be at baseline.   Pt HA treated and improved while in ED.  Presentation is like pts typical HA and non concerning for [facility], [facility], Meningitis,  or temporal arteritis. Pt is afebrile with no focal neuro deficits, nuchal rigidity, or change in vision. Pt is to follow up with PCP to discuss prophylactic medication. Pt verbalizes understanding and is agreeable with plan to dc.      Pariss Hommes, PA-C 06/06/13 2104

## 2013-06-04 NOTE — ED Notes (Signed)
Pt remains alert and oriented, nausea decreased. PERL 2.

## 2013-06-04 NOTE — ED Notes (Signed)
Pt reports headache x 3 days, nausea, vomiting and dizziness x 2 days. Last vomited this am. Zofran 4 mg given by EMS C/o severe photophobia, headache is different with pain in back of head instead of front of head

## 2013-06-16 NOTE — ED Provider Notes (Signed)
Medical screening examination/treatment/procedure(s) were performed by non-physician practitioner and as supervising physician I was immediately available for consultation/collaboration.  EKG Interpretation   None       Devoria Albe, MD, Armando Gang   Ward Givens, MD 06/16/13 [phone]

## 2013-06-26 DIAGNOSIS — G5600 Carpal tunnel syndrome, unspecified upper limb: Secondary | ICD-10-CM | POA: Insufficient documentation

## 2013-06-26 DIAGNOSIS — G609 Hereditary and idiopathic neuropathy, unspecified: Secondary | ICD-10-CM | POA: Insufficient documentation

## 2013-06-26 DIAGNOSIS — G8929 Other chronic pain: Secondary | ICD-10-CM | POA: Insufficient documentation

## 2013-08-14 ENCOUNTER — Ambulatory Visit: Payer: Medicare HMO | Admitting: Nurse Practitioner

## 2013-08-22 DIAGNOSIS — M5417 Radiculopathy, lumbosacral region: Secondary | ICD-10-CM | POA: Insufficient documentation

## 2013-10-01 ENCOUNTER — Encounter (HOSPITAL_COMMUNITY): Payer: Self-pay | Admitting: Emergency Medicine

## 2013-10-01 ENCOUNTER — Emergency Department (HOSPITAL_COMMUNITY): Payer: Medicare HMO

## 2013-10-01 ENCOUNTER — Emergency Department (HOSPITAL_COMMUNITY)
Admission: EM | Admit: 2013-10-01 | Discharge: 2013-10-01 | Disposition: A | Discharge status: Home / Self Care | Payer: Medicare HMO | Attending: Emergency Medicine | Admitting: Emergency Medicine

## 2013-10-01 DIAGNOSIS — Z79899 Other long term (current) drug therapy: Secondary | ICD-10-CM | POA: Insufficient documentation

## 2013-10-01 DIAGNOSIS — F411 Generalized anxiety disorder: Secondary | ICD-10-CM | POA: Insufficient documentation

## 2013-10-01 DIAGNOSIS — D649 Anemia, unspecified: Secondary | ICD-10-CM

## 2013-10-01 DIAGNOSIS — G43909 Migraine, unspecified, not intractable, without status migrainosus: Secondary | ICD-10-CM | POA: Insufficient documentation

## 2013-10-01 DIAGNOSIS — G4700 Insomnia, unspecified: Secondary | ICD-10-CM | POA: Insufficient documentation

## 2013-10-01 DIAGNOSIS — M545 Low back pain, unspecified: Secondary | ICD-10-CM | POA: Insufficient documentation

## 2013-10-01 DIAGNOSIS — K219 Gastro-esophageal reflux disease without esophagitis: Secondary | ICD-10-CM | POA: Insufficient documentation

## 2013-10-01 DIAGNOSIS — M129 Arthropathy, unspecified: Secondary | ICD-10-CM | POA: Insufficient documentation

## 2013-10-01 DIAGNOSIS — E162 Hypoglycemia, unspecified: Secondary | ICD-10-CM

## 2013-10-01 DIAGNOSIS — I10 Essential (primary) hypertension: Secondary | ICD-10-CM | POA: Insufficient documentation

## 2013-10-01 DIAGNOSIS — IMO0001 Reserved for inherently not codable concepts without codable children: Secondary | ICD-10-CM | POA: Insufficient documentation

## 2013-10-01 DIAGNOSIS — G589 Mononeuropathy, unspecified: Secondary | ICD-10-CM | POA: Insufficient documentation

## 2013-10-01 DIAGNOSIS — Z9884 Bariatric surgery status: Secondary | ICD-10-CM | POA: Insufficient documentation

## 2013-10-01 DIAGNOSIS — F489 Nonpsychotic mental disorder, unspecified: Secondary | ICD-10-CM | POA: Insufficient documentation

## 2013-10-01 DIAGNOSIS — F329 Major depressive disorder, single episode, unspecified: Secondary | ICD-10-CM | POA: Insufficient documentation

## 2013-10-01 DIAGNOSIS — I509 Heart failure, unspecified: Secondary | ICD-10-CM

## 2013-10-01 DIAGNOSIS — F3289 Other specified depressive episodes: Secondary | ICD-10-CM | POA: Insufficient documentation

## 2013-10-01 DIAGNOSIS — J45901 Unspecified asthma with (acute) exacerbation: Secondary | ICD-10-CM | POA: Insufficient documentation

## 2013-10-01 DIAGNOSIS — E785 Hyperlipidemia, unspecified: Secondary | ICD-10-CM | POA: Insufficient documentation

## 2013-10-01 DIAGNOSIS — G8929 Other chronic pain: Secondary | ICD-10-CM | POA: Insufficient documentation

## 2013-10-01 DIAGNOSIS — R5381 Other malaise: Secondary | ICD-10-CM | POA: Insufficient documentation

## 2013-10-01 DIAGNOSIS — Z794 Long term (current) use of insulin: Secondary | ICD-10-CM | POA: Insufficient documentation

## 2013-10-01 DIAGNOSIS — R5383 Other fatigue: Secondary | ICD-10-CM

## 2013-10-01 DIAGNOSIS — IMO0002 Reserved for concepts with insufficient information to code with codable children: Secondary | ICD-10-CM | POA: Insufficient documentation

## 2013-10-01 DIAGNOSIS — E1169 Type 2 diabetes mellitus with other specified complication: Secondary | ICD-10-CM | POA: Insufficient documentation

## 2013-10-01 HISTORY — DX: Anemia, unspecified: D64.9

## 2013-10-01 LAB — COMPREHENSIVE METABOLIC PANEL
ALT: 66 U/L — AB (ref 0–35)
AST: 136 U/L — AB (ref 0–37)
Albumin: 2.9 g/dL — ABNORMAL LOW (ref 3.5–5.2)
Alkaline Phosphatase: 203 U/L — ABNORMAL HIGH (ref 39–117)
BUN: 9 mg/dL (ref 6–23)
CALCIUM: 8.7 mg/dL (ref 8.4–10.5)
CO2: 23 meq/L (ref 19–32)
Chloride: 103 mEq/L (ref 96–112)
Creatinine, Ser: 0.7 mg/dL (ref 0.50–1.10)
GFR calc Af Amer: >90 mL/min (ref >90)
GFR calc non Af Amer: >90 mL/min (ref >90)
Glucose, Bld: 70 mg/dL (ref 70–99)
POTASSIUM: 3.7 meq/L (ref 3.7–5.3)
Sodium: 141 mEq/L (ref 137–147)
TOTAL PROTEIN: 7.5 g/dL (ref 6.0–8.3)
Total Bilirubin: 0.3 mg/dL (ref 0.3–1.2)

## 2013-10-01 LAB — CBG MONITORING, ED: GLUCOSE-CAPILLARY: 37 mg/dL — AB (ref 70–99)

## 2013-10-01 LAB — URINALYSIS, ROUTINE W REFLEX MICROSCOPIC
BILIRUBIN URINE: NEGATIVE
Glucose, UA: NEGATIVE mg/dL
HGB URINE DIPSTICK: NEGATIVE
Ketones, ur: NEGATIVE mg/dL
Leukocytes, UA: NEGATIVE
Nitrite: NEGATIVE
PH: 6 (ref 5.0–8.0)
Protein, ur: 30 mg/dL — AB
Specific Gravity, Urine: 1.007 (ref 1.005–1.030)
Urobilinogen, UA: 1 mg/dL (ref 0.0–1.0)

## 2013-10-01 LAB — CBC WITH DIFFERENTIAL/PLATELET
Basophils Absolute: 0.1 10*3/uL (ref 0.0–0.1)
Basophils Relative: 1 % (ref 0–1)
EOS PCT: 3 % (ref 0–5)
Eosinophils Absolute: 0.3 10*3/uL (ref 0.0–0.7)
HCT: 28.8 % — ABNORMAL LOW (ref 36.0–46.0)
Hemoglobin: 8.4 g/dL — ABNORMAL LOW (ref 12.0–15.0)
LYMPHS PCT: 26 % (ref 12–46)
Lymphs Abs: 2.3 10*3/uL (ref 0.7–4.0)
MCH: 20.1 pg — AB (ref 26.0–34.0)
MCHC: 29.2 g/dL — ABNORMAL LOW (ref 30.0–36.0)
MCV: 68.9 fL — ABNORMAL LOW (ref 78.0–100.0)
MONO ABS: 0.6 10*3/uL (ref 0.1–1.0)
Monocytes Relative: 7 % (ref 3–12)
Neutro Abs: 5.4 10*3/uL (ref 1.7–7.7)
Neutrophils Relative %: 63 % (ref 43–77)
PLATELETS: 370 10*3/uL (ref 150–400)
RBC: 4.18 MIL/uL (ref 3.87–5.11)
RDW: 16.7 % — ABNORMAL HIGH (ref 11.5–15.5)
WBC: 8.7 10*3/uL (ref 4.0–10.5)

## 2013-10-01 LAB — URINE MICROSCOPIC-ADD ON

## 2013-10-01 LAB — TROPONIN I: Troponin I: <0.3 ng/mL (ref <0.30)

## 2013-10-01 LAB — POC OCCULT BLOOD, ED: FECAL OCCULT BLD: NEGATIVE

## 2013-10-01 LAB — PRO B NATRIURETIC PEPTIDE: Pro B Natriuretic peptide (BNP): 1711 pg/mL — ABNORMAL HIGH (ref 0–125)

## 2013-10-01 MED ORDER — DIPHENHYDRAMINE HCL 50 MG/ML IJ SOLN
25.0000 mg | Freq: Once | INTRAMUSCULAR | Status: AC
  Administered 2013-10-01: 25 mg via INTRAVENOUS
  Filled 2013-10-01: qty 1

## 2013-10-01 MED ORDER — METOCLOPRAMIDE HCL 5 MG/ML IJ SOLN
5.0000 mg | Freq: Once | INTRAMUSCULAR | Status: AC
  Administered 2013-10-01: 5 mg via INTRAVENOUS
  Filled 2013-10-01: qty 2

## 2013-10-01 MED ORDER — SODIUM CHLORIDE 0.9 % IV BOLUS (SEPSIS)
1000.0000 mL | Freq: Once | INTRAVENOUS | Status: AC
  Administered 2013-10-01: 1000 mL via INTRAVENOUS

## 2013-10-01 MED ORDER — DEXTROSE 50 % IV SOLN
INTRAVENOUS | Status: AC
  Filled 2013-10-01: qty 50

## 2013-10-01 NOTE — ED Notes (Signed)
Dr. Romeo Apple made aware of pts blood sugar pt given sandwhich and oj per dr Romeo Apple

## 2013-10-01 NOTE — ED Notes (Signed)
Bed: AU63 Expected date: 10/01/13 Expected time: 8:22 AM Means of arrival: Ambulance Comments: Resp Distress complaint, no distress reported

## 2013-10-01 NOTE — ED Notes (Addendum)
Per EMS. Pt from home. Reports not feeling well with decreased appetite and some feelings of sob since yesterday. Pt took home insulin this am, but then did not eat breakfast. CBG 43 upon EMS arrival. EMS gave one amp D50, CBG increased to 93 prior to arrival. Pt states she feels sob, speaking complete sentences RR 16, 97% on RA, lungs clear. Pt states MD called her and told her her iron was low.

## 2013-10-01 NOTE — ED Notes (Signed)
Pt's O2 sat remained at 100% during ambulation

## 2013-10-01 NOTE — ED Provider Notes (Signed)
CSN: [id number]     Arrival date & time 10/01/13  4098 History   First MD Initiated Contact with Patient 10/01/13 [phone]     Chief Complaint  Patient presents with  . Hypoglycemia  . Shortness of Breath     (Consider location/radiation/quality/duration/timing/severity/associated sxs/prior Treatment) Patient is a 43 y.o. female presenting with hypoglycemia and shortness of breath. The history is provided by the patient.  Hypoglycemia Associated symptoms: shortness of breath   Associated symptoms: no dizziness and no vomiting   Shortness of breath:    Severity:  Mild   Onset quality:  Gradual   Duration:  3 weeks   Timing:  Constant   Progression:  Worsening Shortness of Breath Severity:  Mild Onset quality:  Gradual Timing:  Constant Progression:  Worsening Chronicity:  New Context comment:  At rest Relieved by:  Nothing Worsened by:  Exertion Ineffective treatments:  Inhaler Associated symptoms: no abdominal pain, no chest pain, no cough, no fever, no headaches, no neck pain and no vomiting     Past Medical History  Diagnosis Date  . Arthritis   . Asthma   . GERD (gastroesophageal reflux disease)   . Diabetes mellitus   . Hyperlipidemia   . Migraines   . Hypertension   . Back pain, chronic   . Depression   . Neuropathy   . Anxiety   . Nervous breakdown sept 2013  . Fibromyalgia   . History of gastric bypass Jan 2013  . Insomnia   . Anemia    Past Surgical History  Procedure Laterality Date  . Back surgery  November 1992  . Cesarean section  December 1998  . Tubal ligation  January 1999  . Cholecystectomy  1998  . Gastric roux-en-y  06/30/2011    Procedure: LAPAROSCOPIC ROUX-EN-Y GASTRIC;  Surgeon: Mariella Saa, MD;  Location: [facility];  Service: General;  Laterality: N/A;     . Esophagogastroduodenoscopy  07/27/2011    Procedure: ESOPHAGOGASTRODUODENOSCOPY (EGD);  Surgeon: Freddy Jaksch, MD;  Location: [facility];  Service: Endoscopy;  Laterality:  N/A;   Family History  Problem Relation Age of Onset  . Diabetes Maternal Aunt   . Diabetes Paternal Aunt   . Diabetes Paternal Grandfather    History  Substance Use Topics  . Smoking status: Never Smoker   . Smokeless tobacco: Never Used  . Alcohol Use: No   OB History   Grav Para Term Preterm Abortions TAB SAB Ect Mult Living                 Review of Systems  Constitutional: Positive for fatigue. Negative for fever.  HENT: Negative for congestion and drooling.   Eyes: Negative for pain.  Respiratory: Positive for shortness of breath. Negative for cough.   Cardiovascular: Negative for chest pain.  Gastrointestinal: Negative for nausea, vomiting, abdominal pain and diarrhea.  Genitourinary: Negative for dysuria and hematuria.       Red stool  Musculoskeletal: Positive for back pain (low back). Negative for gait problem and neck pain.  Skin: Negative for color change.  Neurological: Negative for dizziness and headaches.  Hematological: Negative for adenopathy.  Psychiatric/Behavioral: Negative for behavioral problems.  All other systems reviewed and are negative.     Allergies  Imitrex  Home Medications   Prior to Admission medications   Medication Sig Start Date End Date Taking? Authorizing Provider  albuterol (PROVENTIL HFA;VENTOLIN HFA) 108 (90 BASE) MCG/ACT inhaler Inhale 2 puffs into the lungs every 6 (six)  hours as needed. WHEEZING     Historical Provider, MD  Biotin 1000 MCG tablet Take 1,000 mcg by mouth daily.    Historical Provider, MD  buPROPion (WELLBUTRIN XL) 300 MG 24 hr tablet Take 300 mg by mouth daily. 02/23/13   Historical Provider, MD  citalopram (CELEXA) 20 MG tablet Take 1 tablet (20 mg total) by mouth daily. For depression. 02/19/12 06/04/14  Mike Craze, MD  ferrous sulfate 325 (65 FE) MG tablet Take 1 tablet (325 mg total) by mouth daily with breakfast. 03/17/12   Kathlen Mody, MD  fluticasone (FLONASE) 50 MCG/ACT nasal spray 50 sprays as  directed. 03/07/13   Historical Provider, MD  furosemide (LASIX) 40 MG tablet Take 40 mg by mouth daily. 03/20/13   Historical Provider, MD  insulin glargine (LANTUS) 100 UNIT/ML injection Inject 74 Units into the skin at bedtime. For control of blood sugar 02/19/12   Mike Craze, MD  lidocaine (LIDODERM) 5 % Place 1 patch onto the skin daily as needed (for back pain).  03/21/13   Historical Provider, MD  lisinopril (PRINIVIL,ZESTRIL) 20 MG tablet Take 20 mg by mouth daily. 03/28/13   Historical Provider, MD  lubiprostone (AMITIZA) 24 MCG capsule Take 1 capsule (24 mcg total) by mouth 2 (two) times daily with a meal. 03/17/12   Kathlen Mody, MD  ondansetron (ZOFRAN) 4 MG tablet Take 1 tablet (4 mg total) by mouth every 8 (eight) hours as needed for nausea or vomiting. 06/04/13   Arthor Captain, PA-C  OPANA ER, CRUSH RESISTANT, 5 MG T12A 5 mg daily. 04/03/13   Historical Provider, MD  oxyCODONE-acetaminophen (PERCOCET) 10-325 MG per tablet Take 1 tablet by mouth every 4 (four) hours as needed for pain.    Historical Provider, MD  polyethylene glycol (MIRALAX / GLYCOLAX) packet Take 17 g by mouth 2 (two) times daily. 03/17/12   Kathlen Mody, MD  pregabalin (LYRICA) 75 MG capsule Take 75 mg by mouth 3 (three) times daily. neuropathic pain management 02/20/12 03/21/14  Wonda Cerise, MD  ranitidine (ZANTAC) 150 MG tablet Take 150 mg by mouth daily. 02/11/13   Historical Provider, MD  tiZANidine (ZANAFLEX) 4 MG tablet Take 4 mg by mouth every 6 (six) hours as needed (for pain). 04/04/13   Levert Feinstein, MD  VOLTAREN 1 % GEL  03/18/13   Historical Provider, MD   BP 160/92  Pulse 87  Resp 16  SpO2 100% Physical Exam  Nursing note and vitals reviewed. Constitutional: She is oriented to person, place, and time. She appears well-developed and well-nourished.  HENT:  Head: Normocephalic.  Mouth/Throat: Oropharynx is clear and moist. No oropharyngeal exudate.  Eyes: Conjunctivae and EOM are normal. Pupils are equal,  round, and reactive to light.  Neck: Normal range of motion. Neck supple.  Cardiovascular: Normal rate, regular rhythm, normal heart sounds and intact distal pulses.  Exam reveals no gallop and no friction rub.   No murmur heard. Pulmonary/Chest: Effort normal and breath sounds normal. No respiratory distress. She has no wheezes.  Abdominal: Soft. Bowel sounds are normal. There is no tenderness. There is no rebound and no guarding.  Genitourinary:  Normal appearing external rectum. No gross blood. Brownish appearing stool.   Musculoskeletal: Normal range of motion. She exhibits no edema and no tenderness.  Neurological: She is alert and oriented to person, place, and time. She has normal strength. No cranial nerve deficit or sensory deficit. Coordination and gait normal.  Skin: Skin is warm and dry.  Psychiatric:  She has a normal mood and affect. Her behavior is normal.    ED Course  Procedures (including critical care time) Labs Review Labs Reviewed  CBC WITH DIFFERENTIAL - Abnormal; Notable for the following:    Hemoglobin 8.4 (*)    HCT 28.8 (*)    MCV 68.9 (*)    MCH 20.1 (*)    MCHC 29.2 (*)    RDW 16.7 (*)    All other components within normal limits  COMPREHENSIVE METABOLIC PANEL - Abnormal; Notable for the following:    Albumin 2.9 (*)    AST 136 (*)    ALT 66 (*)    Alkaline Phosphatase 203 (*)    All other components within normal limits  URINALYSIS, ROUTINE W REFLEX MICROSCOPIC - Abnormal; Notable for the following:    APPearance CLOUDY (*)    Protein, ur 30 (*)    All other components within normal limits  PRO B NATRIURETIC PEPTIDE - Abnormal; Notable for the following:    Pro B Natriuretic peptide (BNP) 1711.0 (*)    All other components within normal limits  CBG MONITORING, ED - Abnormal; Notable for the following:    Glucose-Capillary 37 (*)    All other components within normal limits  TROPONIN I  URINE MICROSCOPIC-ADD ON  OCCULT BLOOD X 1 CARD TO LAB,  STOOL  POC OCCULT BLOOD, ED    Imaging Review Dg Chest 2 View  10/01/2013   CLINICAL DATA:  Shortness of breath.  Asthma.  Hypertension.  EXAM: CHEST  2 VIEW  COMPARISON:  03/31/2011.  FINDINGS: Cardiomegaly. Low lung volumes. Mild vascular congestion. Small bilateral effusions. Early CHF is not excluded. Bilateral pneumonia could have this appearance.  Compared with priors, the aeration is worse.  IMPRESSION: Cardiomegaly. Bilateral pulmonary opacities. Early CHF not excluded.   Electronically Signed   By: Davonna BellingJohn  Curnes M.D.   On: 10/01/2013 10:09     EKG Interpretation   Date/Time:  Sunday October 01 2013 08:57:17 EDT Ventricular Rate:  101 PR Interval:  153 QRS Duration: 90 QT Interval:  364 QTC Calculation: 472 R Axis:   3 Text Interpretation:  Sinus tachycardia LVH with secondary repolarization  abnormality Confirmed by Kalena Mander  MD, Shepard Keltz ([id number]) on 10/01/2013 9:06:42  AM      MDM   Final diagnoses:  None    8:59 AM 43 y.o. female with a history of diabetes, anemia who presents with worsening generalized weakness, and shortness of breath over the last few weeks. She notes acute worsening in the last 24-48 hours. She has some chronic low back pain but denies any chest pain. She also notes a gradual onset headache consistent with her migraines which began this morning upon awakening. She denies any fevers or vomiting. She did see some dark red stool when wiping several days ago and has seen this several times in the past. She is afebrile and vital signs are unremarkable here. Wells/Perc neg. Will get screening labs, tx of HA, hemeoccult.   Pt took her insulin this morning but did not eat. Incidentally found to have BS of 37.   12:47 PM: Pt not hypoxic w/ ambulation. Evidence of worsening cardiomegaly and likely mild chf. Known anemia and mildly elevated LFT's.  I have discussed the diagnosis/risks/treatment options with the patient and believe the pt to be eligible for discharge  home to follow-up with her pcp tomorrow for outpt workup. We also discussed returning to the ED immediately if new or worsening sx occur. We  discussed the sx which are most concerning (e.g., worsening sob, cp, fever) that necessitate immediate return. Medications administered to the patient during their visit and any new prescriptions provided to the patient are listed below.  Medications given during this visit Medications  sodium chloride 0.9 % bolus 1,000 mL (1,000 mLs Intravenous New Bag/Given 10/01/13 0926)  metoCLOPramide (REGLAN) injection 5 mg (5 mg Intravenous Given 10/01/13 0927)  diphenhydrAMINE (BENADRYL) injection 25 mg (25 mg Intravenous Given 10/01/13 0928)    New Prescriptions   No medications on file     Junius Argyle, MD 10/01/13 1248

## 2013-10-02 LAB — CBG MONITORING, ED: Glucose-Capillary: 211 mg/dL — ABNORMAL HIGH (ref 70–99)

## 2013-10-05 ENCOUNTER — Telehealth: Payer: Self-pay | Admitting: Internal Medicine

## 2013-10-05 NOTE — Telephone Encounter (Signed)
LEFT MESSAGE FOR PATIENT TO RETURN CALL TO SCHEDULE NP APPT.  °

## 2013-10-06 ENCOUNTER — Telehealth: Payer: Self-pay | Admitting: Internal Medicine

## 2013-10-06 NOTE — Telephone Encounter (Signed)
PATIENT CALLED TO SCHEDULE NP APPT FOR 04/30 @ 10:30 W/DR. CHISM.  REFERRING DR. Burnell Blanks  WELCOME PACKET MAILED.

## 2013-10-09 ENCOUNTER — Telehealth: Payer: Self-pay | Admitting: Internal Medicine

## 2013-10-09 NOTE — Telephone Encounter (Signed)
C/D 10/09/13 for appt. 10/12/13

## 2013-10-12 ENCOUNTER — Other Ambulatory Visit: Payer: Self-pay | Admitting: Internal Medicine

## 2013-10-12 ENCOUNTER — Other Ambulatory Visit (HOSPITAL_BASED_OUTPATIENT_CLINIC_OR_DEPARTMENT_OTHER): Payer: Commercial Managed Care - HMO

## 2013-10-12 ENCOUNTER — Ambulatory Visit: Payer: Commercial Managed Care - HMO

## 2013-10-12 ENCOUNTER — Ambulatory Visit (HOSPITAL_BASED_OUTPATIENT_CLINIC_OR_DEPARTMENT_OTHER): Payer: Commercial Managed Care - HMO | Admitting: Internal Medicine

## 2013-10-12 ENCOUNTER — Ambulatory Visit (HOSPITAL_BASED_OUTPATIENT_CLINIC_OR_DEPARTMENT_OTHER): Payer: Commercial Managed Care - HMO

## 2013-10-12 VITALS — BP 144/90 | HR 99 | Temp 98.3°F | Resp 18 | Ht 65.0 in | Wt 218.9 lb

## 2013-10-12 DIAGNOSIS — Z9884 Bariatric surgery status: Secondary | ICD-10-CM

## 2013-10-12 DIAGNOSIS — E119 Type 2 diabetes mellitus without complications: Secondary | ICD-10-CM

## 2013-10-12 DIAGNOSIS — D509 Iron deficiency anemia, unspecified: Secondary | ICD-10-CM

## 2013-10-12 DIAGNOSIS — N920 Excessive and frequent menstruation with regular cycle: Secondary | ICD-10-CM

## 2013-10-12 DIAGNOSIS — Z862 Personal history of diseases of the blood and blood-forming organs and certain disorders involving the immune mechanism: Secondary | ICD-10-CM

## 2013-10-12 DIAGNOSIS — D500 Iron deficiency anemia secondary to blood loss (chronic): Secondary | ICD-10-CM

## 2013-10-12 LAB — CBC & DIFF AND RETIC
BASO%: 0.6 % (ref 0.0–2.0)
BASOS ABS: 0.1 10*3/uL (ref 0.0–0.1)
EOS ABS: 0.2 10*3/uL (ref 0.0–0.5)
EOS%: 2.2 % (ref 0.0–7.0)
HCT: 26.6 % — ABNORMAL LOW (ref 34.8–46.6)
HGB: 7.9 g/dL — ABNORMAL LOW (ref 11.6–15.9)
Immature Retic Fract: 20.5 % — ABNORMAL HIGH (ref 1.60–10.00)
LYMPH%: 22 % (ref 14.0–49.7)
MCH: 19.5 pg — ABNORMAL LOW (ref 25.1–34.0)
MCHC: 29.8 g/dL — ABNORMAL LOW (ref 31.5–36.0)
MCV: 65.2 fL — ABNORMAL LOW (ref 79.5–101.0)
MONO#: 0.6 10*3/uL (ref 0.1–0.9)
MONO%: 5.8 % (ref 0.0–14.0)
NEUT%: 69.4 % (ref 38.4–76.8)
NEUTROS ABS: 7.5 10*3/uL — AB (ref 1.5–6.5)
Platelets: 412 10*3/uL — ABNORMAL HIGH (ref 145–400)
RBC: 4.08 10*6/uL (ref 3.70–5.45)
RDW: 17.6 % — ABNORMAL HIGH (ref 11.2–14.5)
Retic %: 1.47 % (ref 0.70–2.10)
Retic Ct Abs: 59.98 10*3/uL (ref 33.70–90.70)
WBC: 10.8 10*3/uL — AB (ref 3.9–10.3)
lymph#: 2.4 10*3/uL (ref 0.9–3.3)

## 2013-10-12 LAB — COMPREHENSIVE METABOLIC PANEL (CC13)
ALT: 44 U/L (ref 0–55)
AST: 63 U/L — ABNORMAL HIGH (ref 5–34)
Albumin: 2.9 g/dL — ABNORMAL LOW (ref 3.5–5.0)
Alkaline Phosphatase: 220 U/L — ABNORMAL HIGH (ref 40–150)
Anion Gap: 12 mEq/L — ABNORMAL HIGH (ref 3–11)
BILIRUBIN TOTAL: 0.41 mg/dL (ref 0.20–1.20)
BUN: 11.4 mg/dL (ref 7.0–26.0)
CALCIUM: 8.8 mg/dL (ref 8.4–10.4)
CHLORIDE: 109 meq/L (ref 98–109)
CO2: 23 mEq/L (ref 22–29)
Creatinine: 0.8 mg/dL (ref 0.6–1.1)
Glucose: 45 mg/dl — ABNORMAL LOW (ref 70–140)
Potassium: 3.7 mEq/L (ref 3.5–5.1)
SODIUM: 144 meq/L (ref 136–145)
TOTAL PROTEIN: 7.2 g/dL (ref 6.4–8.3)

## 2013-10-12 LAB — IRON AND TIBC CHCC
%SAT: 4 % — ABNORMAL LOW (ref 21–57)
Iron: 16 ug/dL — ABNORMAL LOW (ref 41–142)
TIBC: 441 ug/dL (ref 236–444)
UIBC: 425 ug/dL — AB (ref 120–384)

## 2013-10-12 LAB — LACTATE DEHYDROGENASE (CC13): LDH: 368 U/L — ABNORMAL HIGH (ref 125–245)

## 2013-10-12 LAB — CHCC SMEAR

## 2013-10-12 LAB — FERRITIN CHCC: Ferritin: 9 ng/ml (ref 9–269)

## 2013-10-12 MED ORDER — SODIUM CHLORIDE 0.9 % IV SOLN
1020.0000 mg | Freq: Once | INTRAVENOUS | Status: DC
  Administered 2013-10-12: 1020 mg via INTRAVENOUS
  Filled 2013-10-12: qty 34

## 2013-10-12 NOTE — Patient Instructions (Signed)
Ferumoxytol injection What is this medicine? FERUMOXYTOL is an iron complex. Iron is used to make healthy red blood cells, which carry oxygen and nutrients throughout the body. This medicine is used to treat iron deficiency anemia in people with chronic kidney disease. This medicine may be used for other purposes; ask your health care provider or pharmacist if you have questions. COMMON BRAND NAME(S): Feraheme  What should I tell my health care provider before I take this medicine? They need to know if you have any of these conditions: -anemia not caused by low iron levels -high levels of iron in the blood -magnetic resonance imaging (MRI) test scheduled -an unusual or allergic reaction to iron, other medicines, foods, dyes, or preservatives -pregnant or trying to get pregnant -breast-feeding How should I use this medicine? This medicine is for injection into a vein. It is given by a health care professional in a hospital or clinic setting. Talk to your pediatrician regarding the use of this medicine in children. Special care may be needed. Overdosage: If you think you've taken too much of this medicine contact a poison control center or emergency room at once. Overdosage: If you think you have taken too much of this medicine contact a poison control center or emergency room at once. NOTE: This medicine is only for you. Do not share this medicine with others. What if I miss a dose? It is important not to miss your dose. Call your doctor or health care professional if you are unable to keep an appointment. What may interact with this medicine? This medicine may interact with the following medications: -other iron products This list may not describe all possible interactions. Give your health care provider a list of all the medicines, herbs, non-prescription drugs, or dietary supplements you use. Also tell them if you smoke, drink alcohol, or use illegal drugs. Some items may interact with your  medicine. What should I watch for while using this medicine? Visit your doctor or healthcare professional regularly. Tell your doctor or healthcare professional if your symptoms do not start to get better or if they get worse. You may need blood work done while you are taking this medicine. You may need to follow a special diet. Talk to your doctor. Foods that contain iron include: whole grains/cereals, dried fruits, beans, or peas, leafy green vegetables, and organ meats (liver, kidney). What side effects may I notice from receiving this medicine? Side effects that you should report to your doctor or health care professional as soon as possible: -allergic reactions like skin rash, itching or hives, swelling of the face, lips, or tongue -breathing problems -changes in blood pressure -feeling faint or lightheaded, falls -fever or chills -flushing, sweating, or hot feelings -swelling of the ankles or feet Side effects that usually do not require medical attention (Report these to your doctor or health care professional if they continue or are bothersome.): -diarrhea -headache -nausea, vomiting -stomach pain This list may not describe all possible side effects. Call your doctor for medical advice about side effects. You may report side effects to FDA at 1-800-FDA-1088. Where should I keep my medicine? This drug is given in a hospital or clinic and will not be stored at home. NOTE: This sheet is a summary. It may not cover all possible information. If you have questions about this medicine, talk to your doctor, pharmacist, or health care provider.  2014, Elsevier/Gold Standard. (2012-01-15 15:23:36) Ferritin This is done to test for anemia. Anemia occurs when the amount  of hemoglobin (found in the red blood cells) drops below normal. Hemoglobin is necessary for the transportation of oxygen throughout the body. Blood tests may show a variety of common, treatable abnormalities that can lead to  problems associated with anemia. Iron deficiency anemia is the most common of the anemias. It is usually due to bleeding. In women, iron deficiency may be due to heavy menstrual periods. In older women and in men, the bleeding is usually from disease of the intestines. In children and in pregnant women, the body needs more iron. Iron deficiency may be due to simply not eating enough iron in the diet. Iron deficiency may also result from some extreme diets. Treatment of iron deficiency usually involves iron supplements. In older women and in men, there is usually some further testing needed to determine why the person is iron deficient.  PREPARATION FOR TEST A blood sample is obtained by inserting a needle into a vein in the arm. NORMAL FINDINGS Female: 12-300ng/ml or 12-300  g/L (SI units) Female:  10-150 ng/ml or 10-150  g/L (SI units) Children/adolescent:  Newborn: 25-200 ng/ml  1 month: 200-600 ng/ml  2-5 months: 50-200 ng/ml  6 months-15 years: 7-142 ng/ml Ranges for normal findings may vary among different laboratories and hospitals. You should always check with your doctor after having lab work or other tests done to discuss the meaning of your test results and whether your values are considered within normal limits. MEANING OF TEST  Your caregiver will go over the test results with you and discuss the importance and meaning of your results, as well as treatment options and the need for additional tests if necessary. OBTAINING THE TEST RESULTS  It is your responsibility to obtain your test results. Ask the lab or department performing the test when and how you will get your results. Document Released: 06/24/2004 Document Revised: 08/24/2011 Document Reviewed: 05/11/2008 [facility] Patient Information 2014 [place], [place]. Iron Deficiency Anemia, Adult Anemia is a condition in which there are less red blood cells or hemoglobin in the blood than normal. Hemoglobin is this part of red blood  cells that carries oxygen. Iron deficiency anemia is anemia caused by too little iron. It is the most common type of anemia. It may leave you tired and short of breath. CAUSES   Lack of iron in the diet.  Poor absorption of iron, as seen with intestinal disorders.  Intestinal bleeding.  Heavy periods. SIGNS AND SYMPTOMS  Mild anemia may not be noticeable. Symptoms may include:  Fatigue.  Headache.  Pale skin.  Weakness.  Tiredness.  Shortness of breath.  Dizziness.  Cold hands and feet.  Fast or irregular heartbeat. DIAGNOSIS  Diagnosis requires a thorough evaluation and physical exam by your health care provider. Blood tests are generally used to confirm iron deficiency anemia. Additional tests may be done to find the underlying cause of your anemia. These may include:  Testing for blood in the stool (fecal occult blood test).  A procedure to see inside the colon and rectum (colonoscopy).  A procedure to see inside the esophagus and stomach (endoscopy). TREATMENT  Iron deficiency anemia is treated by correcting the cause of the deficiency. Treatment may involve:  Adding iron-rich foods to your diet.  Taking iron supplements. Pregnant or breastfeeding women need to take extra iron, because their normal diet usually does not provide the required amount.  Taking vitamins. Vitamin C improves the absorption of iron. Your health care provider may recommend taking your iron tablets with  a glass of orange juice or vitamin C supplement.  Medicines to make heavy menstrual flow lighter.  Surgery. HOME CARE INSTRUCTIONS   Take iron as directed by your health care provider.  If you cannot tolerate taking iron supplements by mouth, talk to your health care provider about taking them through a vein (intravenously) or an injection into a muscle.  For the best iron absorption, iron supplements should be taken on an empty stomach. If you cannot tolerate them on an empty  stomach, you may need to take them with food.  Do not drink milk or take antacids at the same time as your iron supplements. Milk and antacids may interfere with the absorption of iron.  Iron supplements can cause constipation. Make sure to include fiber in your diet to prevent constipation. A stool softener may also be recommended.  Take vitamins as directed by your health care provider.  Eat a diet rich in iron. Foods high in iron include liver, lean beef, whole-grain bread, eggs, dried fruit, and dark green, leafy vegetables. SEEK IMMEDIATE MEDICAL CARE IF:   You faint. If this happens, do not drive. Call your local emergency services (911 in [place] if no other help is available.  You have chest pain.  You feel nauseous or vomit.  You have severe or increased shortness of breath with activity.  You feel weak.  You have a rapid heartbeat.  You have unexplained sweating.  You become lightheaded when getting up from a chair or bed. MAKE SURE YOU:   Understand these instructions.  Will watch your condition.  Will get help right away if you are not doing well or get worse. Document Released: 05/29/2000 Document Revised: 03/22/2013 Document Reviewed: 02/06/2013 [facility] Patient Information 2014 [place], [place].

## 2013-10-12 NOTE — Patient Instructions (Signed)

## 2013-10-12 NOTE — Progress Notes (Signed)
[place] Telephone:([phone]   Fax:([phone]  NEW PATIENT EVALUATION   Name: [name] Date: 10/12/2013 MRN: [id number] DOB: 1970/07/24  PCP: [name], MD   REFERRING PHYSICIAN: Hamrick, Lorin Mercy, MD  REASON FOR REFERRAL: History of IDA  HISTORY OF PRESENT ILLNESS:[name] is a 43 y.o. female who is being referred by Dr. Lisbeth Ply for further evaluation of her iron defiency anemia (IDA).   Today, she is accompanied by her mother [name].   Of note, she presented [facility] emergency room on 10/01/2013 with dyspnea and low energy.  At that time, her hemoglobin was in the high 7s.  She was recommended for hematology referral because the patient was taking her iron supplement as recommended for the past several months.  She was taking ferrous sulfate 325 mg tid with orange juice per her PCP.  At the emergency room her hemoglobin was 8.4, and she was not transfused.  Her FOBT test was negative. Liver function tests were elevated (AST 136, ALT 66, Alk phos 203) and pro-BNP was high at 1711.  CXR showed cardiomegaly, mild vascular congestion, small bilateral effusions, possibly early CHF.  She is scheduled to see Dr. Athena Masse of [facility] tomorrow.   Her lasix has increased from 40 mg daily to 80 mg daily due to lower extremity swelling.  She was discharged with follow up with cardiology and hematology.  She saw Dr. Lisbeth Ply on 10/03/2013.     Today, she reports dyspnea and PND and orthopnea, dry cough, abdominal pain. She admits occasional chest pain described as "throbbing pain).  It is alleviated with rest.  Its aggravated with moving around.  She resides in [place], [place] around 25 minutes from here.   LMP started this Sunday.   Her menstrual periods last 4 days, with 4/5 "big thick pads" per day.  She chews ice all day long.  She reports increased thirst.  She denies an iron infusion via intravenously.  She had a gastric bypass  surgery on January 2013.   She weighed 246 lbs prior and lost about 75 lbs.  She reports not being on iron prior to her gastric bypass surgery.  She had some blood in stool a few weeks ago but denies any presently.  She had a colonoscopy (03/16/2012 by Dr. Paulita Fujita demonstrating hemmorhoids) she reports prior to bypass surgery done by Dr. Excell Seltzer on 06/30/2011. Marland Kitchen  She does have diabetes mellitus since age 3 and GERD.  She had screening mammogram in 12/30/2012 which was reported as within normal limits and she will schedule one this July.  She is disabled due to back problems, bipolar and history of nervous breakdown (following her gastric bypass).  She had a back surgery done in 1997 with L5-S1 diskectomy by Dr.  Joya Salm.  Her mood is stable.  She has a 69 year old son delivered via C-section.  Other surgeries include tubal ligation (1999) and cholecystectomy (1999).   She is on Amoxicillin for the past 10 days for sinus and R ear infusion (by Dr. Lisbeth Ply).    PAST MEDICAL HISTORY:  has a past medical history of Arthritis; Asthma; GERD (gastroesophageal reflux disease); Diabetes mellitus; Hyperlipidemia; Migraines; Hypertension; Back pain, chronic; Depression; Neuropathy; Anxiety; Nervous breakdown (sept 2013); Fibromyalgia; History of gastric bypass (Jan 2013); Insomnia; and Anemia.     PAST SURGICAL HISTORY: Past Surgical History  Procedure Laterality Date  . Back surgery  November 1992  . Cesarean section  December  1998  . Tubal ligation  January 1999  . Cholecystectomy  1998  . Gastric roux-en-y  06/30/2011    Procedure: LAPAROSCOPIC ROUX-EN-Y GASTRIC;  Surgeon: Edward Jolly, MD;  Location: [facility];  Service: General;  Laterality: N/A;     . Esophagogastroduodenoscopy  07/27/2011    Procedure: ESOPHAGOGASTRODUODENOSCOPY (EGD);  Surgeon: Landry Dyke, MD;  Location: [facility];  Service: Endoscopy;  Laterality: N/A;     CURRENT MEDICATIONS: has a current medication list which  includes the following prescription(s): albuterol, biotin, bupropion, citalopram, ferrous fumarate, fluticasone, furosemide, insulin glargine, lidocaine, lubiprostone, oxycodone-acetaminophen, polyethylene glycol, pregabalin, ranitidine, tizanidine, and voltaren.   ALLERGIES: Imitrex   SOCIAL HISTORY:  reports that she has never smoked. She has never used smokeless tobacco. She reports that she does not drink alcohol or use illicit drugs.   FAMILY HISTORY: family history includes Diabetes in her maternal aunt, paternal aunt, and paternal grandfather.   LABORATORY DATA:  Results for orders placed in visit on 10/12/13 (from the past 48 hour(s))  CBC & DIFF AND RETIC     Status: Abnormal   Collection Time    10/12/13 10:39 AM      Result Value Ref Range   WBC 10.8 (*) 3.9 - 10.3 10e3/uL   NEUT# 7.5 (*) 1.5 - 6.5 10e3/uL   HGB 7.9 (*) 11.6 - 15.9 g/dL   HCT 26.6 (*) 34.8 - 46.6 %   Platelets 412 (*) 145 - 400 10e3/uL   MCV 65.2 (*) 79.5 - 101.0 fL   MCH 19.5 (*) 25.1 - 34.0 pg   MCHC 29.8 (*) 31.5 - 36.0 g/dL   RBC 4.08  3.70 - 5.45 10e6/uL   RDW 17.6 (*) 11.2 - 14.5 %   lymph# 2.4  0.9 - 3.3 10e3/uL   MONO# 0.6  0.1 - 0.9 10e3/uL   Eosinophils Absolute 0.2  0.0 - 0.5 10e3/uL   Basophils Absolute 0.1  0.0 - 0.1 10e3/uL   NEUT% 69.4  38.4 - 76.8 %   LYMPH% 22.0  14.0 - 49.7 %   MONO% 5.8  0.0 - 14.0 %   EOS% 2.2  0.0 - 7.0 %   BASO% 0.6  0.0 - 2.0 %   Retic % 1.47  0.70 - 2.10 %   Retic Ct Abs 59.98  33.70 - 90.70 10e3/uL   Immature Retic Fract 20.50 (*) 1.60 - 10.00 %  [facility] SMEAR     Status: None   Collection Time    10/12/13 10:39 AM      Result Value Ref Range   Smear Result Smear Available    FERRITIN [facility]     Status: None   Collection Time    10/12/13 10:39 AM      Result Value Ref Range   Ferritin 9  9 - 269 ng/ml  IRON AND TIBC [facility]     Status: Abnormal   Collection Time    10/12/13 10:39 AM      Result Value Ref Range   Iron 16 (*) 41 - 142 ug/dL   TIBC 441   236 - 444 ug/dL   UIBC 425 (*) 120 - 384 ug/dL   %SAT 4 (*) 21 - 57 %  COMPREHENSIVE METABOLIC PANEL (DU20)     Status: Abnormal   Collection Time    10/12/13 10:39 AM      Result Value Ref Range   Sodium 144  136 - 145 mEq/L   Potassium 3.7  3.5 - 5.1  mEq/L   Chloride 109  98 - 109 mEq/L   CO2 23  22 - 29 mEq/L   Glucose 45 (*) 70 - 140 mg/dl   BUN 11.4  7.0 - 26.0 mg/dL   Creatinine 0.8  0.6 - 1.1 mg/dL   Total Bilirubin 0.41  0.20 - 1.20 mg/dL   Alkaline Phosphatase 220 (*) 40 - 150 U/L   AST 63 (*) 5 - 34 U/L   ALT 44  0 - 55 U/L   Total Protein 7.2  6.4 - 8.3 g/dL   Albumin 2.9 (*) 3.5 - 5.0 g/dL   Calcium 8.8  8.4 - 10.4 mg/dL   Anion Gap 12 (*) 3 - 11 mEq/L  LACTATE DEHYDROGENASE (CC13)     Status: Abnormal   Collection Time    10/12/13 10:39 AM      Result Value Ref Range   LDH 368 (*) 125 - 245 U/L       RADIOGRAPHY: Dg Chest 2 View  10/01/2013   CLINICAL DATA:  Shortness of breath.  Asthma.  Hypertension.  EXAM: CHEST  2 VIEW  COMPARISON:  03/31/2011.  FINDINGS: Cardiomegaly. Low lung volumes. Mild vascular congestion. Small bilateral effusions. Early CHF is not excluded. Bilateral pneumonia could have this appearance.  Compared with priors, the aeration is worse.  IMPRESSION: Cardiomegaly. Bilateral pulmonary opacities. Early CHF not excluded.   Electronically Signed   By: Rolla Flatten M.D.   On: 10/01/2013 10:09   REVIEW OF SYSTEMS:  Constitutional: Denies fevers, chills or abnormal weight loss Eyes: Denies blurriness of vision Ears, nose, mouth, throat, and face: Denies mucositis or sore throat Respiratory: Denies cough, dyspnea or wheezes Cardiovascular: Denies palpitation, chest discomfort or lower extremity swelling Gastrointestinal:  Denies nausea, heartburn or change in bowel habits Skin: Reports sores on her lower extremities every few months.  Lymphatics: Denies new lymphadenopathy or easy bruising Neurological:Denies numbness, tingling or new  weaknesses Behavioral/Psych: Mood is stable, no new changes  All other systems were reviewed with the patient and are negative.  PHYSICAL EXAM:  height is _0  (1.651 m) and weight is 218 lb 14.4 oz (99.292 kg). Her oral temperature is 98.3 F (36.8 C). Her blood pressure is 144/90 and her pulse is 99. Her respiration is 18 and oxygen saturation is 99%.    GENERAL:alert, no distress and comfortable; moderately obese.  SKIN: skin color, texture, turgor are normal, positive healed scar hyperpigemented on lower extremities.  EYES: normal, Conjunctiva are pink and non-injected, sclera clear OROPHARYNX:no exudate, no erythema and lips, buccal mucosa, and tongue normal  NECK: supple, thyroid normal size, non-tender, without nodularity LYMPH:  no palpable lymphadenopathy in the cervical, axillary or inguinal LUNGS: clear to auscultation and percussion with normal breathing effort HEART: regular rate & rhythm and no murmurs and no lower extremity edema ABDOMEN:abdomen soft, non-tender and normal bowel sounds Musculoskeletal:no cyanosis of digits and no clubbing  NEURO: alert & oriented x 3 with fluent speech, no focal motor/sensory deficits   IMPRESSION: AVIGAYIL TON is a 43 y.o. female with a history of   PLAN:  1.  Severe IDA secondary to #2. --Her iron indices demonstrate iron level of 16 and ferritin of 9 and an elevated UIBC of 425.  Her hemoglobin is 7.9 with a low MCV of 65.2.  She has reticulocyte percent is 1.47.   She has two factor contributing to iron deficiency secondary to menorrhagia and/or s/p gastric bypass surgery.  GI workup including FOBT was  negative.  We will set up for feraheme 1,020 mg intravenously today. We explained the indications, risks and benefits of feraheme infusion and she agrees to proceed.  If she becomes symptomatic with chest discomfort or worsening fatigue, she was instructed to call our office for blood transfusion.   2. Menorrhagia.  -- Referral to  Gynecology.  She will likely require an ultrasound to rule of fibroids or polyps.  3. Early CHF. --Patient has follow up tomorrow with [facility].   4. DM2/ Hypoglycemia. --Patient was given orange juice and has been counseled that her insulin regiment may require adjustment given her low glucose.  She is aware of symptoms of hypoglycemia including palpitations, lightheadedness, etc.   5. Follow up. --She will follow up with CBC in one week and iron studies and CBC and office visit in 2 weeks.   All questions were answered. The patient knows to call the clinic with any problems, questions or concerns. We can certainly see the patient much sooner if necessary.  I spent 40 minutes counseling the patient face to face. The total time spent in the appointment was 60 minutes.   Concha Norway, MD 10/12/2013 12:06 PM

## 2013-10-13 ENCOUNTER — Other Ambulatory Visit: Payer: Self-pay | Admitting: *Deleted

## 2013-10-13 ENCOUNTER — Encounter: Payer: Self-pay | Admitting: Cardiology

## 2013-10-13 ENCOUNTER — Ambulatory Visit (INDEPENDENT_AMBULATORY_CARE_PROVIDER_SITE_OTHER): Payer: Medicare HMO | Admitting: Cardiology

## 2013-10-13 VITALS — BP 144/86 | HR 98 | Ht 65.0 in | Wt 219.0 lb

## 2013-10-13 DIAGNOSIS — I5031 Acute diastolic (congestive) heart failure: Secondary | ICD-10-CM

## 2013-10-13 MED ORDER — NEBIVOLOL HCL 2.5 MG PO TABS: 2.5000 mg | ORAL_TABLET | Freq: Every day | ORAL | Status: DC

## 2013-10-13 MED ORDER — FUROSEMIDE 40 MG PO TABS: ORAL_TABLET | ORAL | Status: DC

## 2013-10-13 MED ORDER — OLMESARTAN MEDOXOMIL 20 MG PO TABS: 20.0000 mg | ORAL_TABLET | Freq: Every day | ORAL | Status: DC

## 2013-10-13 MED ORDER — METOLAZONE 2.5 MG PO TABS: 2.5000 mg | ORAL_TABLET | Freq: Every day | ORAL | Status: DC

## 2013-10-13 MED ORDER — POTASSIUM CHLORIDE CRYS ER 20 MEQ PO TBCR: 20.0000 meq | EXTENDED_RELEASE_TABLET | Freq: Every day | ORAL | Status: DC

## 2013-10-13 NOTE — Progress Notes (Signed)
BMET ordered for 5/20 when pt comes in for other tests, per Dr Delton See Pt notified

## 2013-10-13 NOTE — Patient Instructions (Addendum)
START TAKING BYSTOLIC 2.5 MG DAILY   START TAKING LASIX 40 MG 1 IN THE A.M. AND 1 IN THE AFTERNOON  START TAKING 2.5 MG ZAROXOLYN DAILY  START TAKING 20 mEq of POTASSIUM DAILY   Your physician has requested that you have an echocardiogram. Echocardiography is a painless test that uses sound waves to create images of your heart. It provides your doctor with information about the size and shape of your heart and how well your heart's chambers and valves are working. This procedure takes approximately one hour. There are no restrictions for this procedure.  Your physician has requested that you have a lexiscan myoview. For further information please visit [email or web address] Please follow instruction sheet, as given.  Your physician recommends that you schedule a follow-up APPOINTMENT WITH DR Delton See IN 1 MONTH

## 2013-10-13 NOTE — Progress Notes (Signed)
Patient ID: YUNUEN MORDAN, female   DOB: Jun 08, 1971, 43 y.o.   MRN: [id number]    Patient Name: [name] Date of Encounter: 10/13/2013  Primary Care Provider:  [name], MD Primary Cardiologist:  Dorothy Spark   Problem List   Past Medical History  Diagnosis Date  . Arthritis   . Asthma   . GERD (gastroesophageal reflux disease)   . Diabetes mellitus   . Hyperlipidemia   . Migraines   . Hypertension   . Back pain, chronic   . Depression   . Neuropathy   . Anxiety   . Nervous breakdown sept 2013  . Fibromyalgia   . History of gastric bypass Jan 2013  . Insomnia   . Anemia    Past Surgical History  Procedure Laterality Date  . Back surgery  November 1992  . Cesarean section  December 1998  . Tubal ligation  January 1999  . Cholecystectomy  1998  . Gastric roux-en-y  06/30/2011    Procedure: LAPAROSCOPIC ROUX-EN-Y GASTRIC;  Surgeon: Edward Jolly, MD;  Location: [facility];  Service: General;  Laterality: N/A;     . Esophagogastroduodenoscopy  07/27/2011    Procedure: ESOPHAGOGASTRODUODENOSCOPY (EGD);  Surgeon: Landry Dyke, MD;  Location: [facility];  Service: Endoscopy;  Laterality: N/A;    Allergies  Allergies  Allergen Reactions  . Imitrex [Sumatriptan] Nausea Only    Can not take the pill but she take the injection    HPI  A 43 year old female with prior medical history of obesity status post gastric bypass surgery, diabetes mellitus x17 years and hypertension. The patient recently presented to the emergency room on 10/01/2013 with dyspnea and low energy. At that time, her hemoglobin was 8.4, and she was not transfused. Her FOBT test was negative. Liver function tests were elevated (AST 136, ALT 66, Alk phos 203) and pro-BNP was high at 1711. CXR showed cardiomegaly, mild vascular congestion, small bilateral effusions, possibly early CHF.  She has been on Lasix 40 mg for the last 3 months that was increased to 80 mg daily in the  ER.  The patient states that she has been experiencing dyspnea on exertion and lower extremity edema for at least 3 months. It has progressed over the last couple of weeks when she started to develop orthopnea currently 4-5 pillows with minimal improvement after initiation an increase of Lasix. She reports that some of her lower extremity edema has improved however orthopnea is at the same degree. She was prescribed lisinopril that gave her dry cough and was switched to Benicar 20 mg po daily.  She admits to occasional exertional chest tightness that is alleviated at rest. No palpitations or syncope. She has never smoked and doesn't have significant family history of heart failure or premature coronary artery disease.  Home Medications  Prior to Admission medications   Medication Sig Start Date End Date Taking? Authorizing Provider  albuterol (PROVENTIL HFA;VENTOLIN HFA) 108 (90 BASE) MCG/ACT inhaler Inhale 2 puffs into the lungs every 6 (six) hours as needed. WHEEZING    Yes Historical Provider, MD  Biotin 1000 MCG tablet Take 1,000 mcg by mouth daily.   Yes Historical Provider, MD  buPROPion (WELLBUTRIN XL) 300 MG 24 hr tablet Take 300 mg by mouth daily. 02/23/13  Yes Historical Provider, MD  citalopram (CELEXA) 20 MG tablet Take 1 tablet (20 mg total) by mouth daily. For depression. 02/19/12 06/04/14 Yes Darrol Jump, MD  ferrous fumarate (HEMOCYTE - 106 MG  FE) 325 (106 FE) MG TABS tablet Take 1 tablet by mouth 2 (two) times daily.   Yes Historical Provider, MD  fluticasone (FLONASE) 50 MCG/ACT nasal spray Place 50 sprays into both nostrils daily.  03/07/13  Yes Historical Provider, MD  furosemide (LASIX) 80 MG tablet Take 80 mg by mouth daily.   Yes Historical Provider, MD  insulin glargine (LANTUS) 100 UNIT/ML injection Inject 74 Units into the skin at bedtime. For control of blood sugar 02/19/12  Yes Darrol Jump, MD  lidocaine (LIDODERM) 5 % Place 1 patch onto the skin daily as needed (for  back pain).  03/21/13  Yes Historical Provider, MD  lubiprostone (AMITIZA) 24 MCG capsule Take 1 capsule (24 mcg total) by mouth 2 (two) times daily with a meal. 03/17/12  Yes Hosie Poisson, MD  oxyCODONE-acetaminophen (PERCOCET) 10-325 MG per tablet Take 1 tablet by mouth every 4 (four) hours as needed for pain.   Yes Historical Provider, MD  polyethylene glycol (MIRALAX / GLYCOLAX) packet Take 17 g by mouth 2 (two) times daily. 03/17/12  Yes Hosie Poisson, MD  pregabalin (LYRICA) 150 MG capsule Take 150 mg by mouth 3 (three) times daily.   Yes Historical Provider, MD  ranitidine (ZANTAC) 150 MG tablet Take 150 mg by mouth daily. 02/11/13  Yes Historical Provider, MD  tiZANidine (ZANAFLEX) 4 MG tablet Take 4 mg by mouth every 6 (six) hours as needed (for pain). 04/04/13  Yes Marcial Pacas, MD  VOLTAREN 1 % GEL Apply 2 g topically 4 (four) times daily.  03/18/13  Yes Historical Provider, MD    Family History  Family History  Problem Relation Age of Onset  . Diabetes Maternal Aunt   . Diabetes Paternal Aunt   . Diabetes Paternal Grandfather     Social History  History   Social History  . Marital Status: Single    Spouse Name: N/A    Number of Children: N/A  . Years of Education: N/A   Occupational History  . Not on file.   Social History Main Topics  . Smoking status: Never Smoker   . Smokeless tobacco: Never Used  . Alcohol Use: No  . Drug Use: No  . Sexual Activity: No   Other Topics Concern  . Not on file   Social History Narrative  . No narrative on file     Review of Systems, as per HPI, otherwise negative General:  No chills, fever, night sweats or weight changes.  Cardiovascular:  No chest pain, dyspnea on exertion, edema, orthopnea, palpitations, paroxysmal nocturnal dyspnea. Dermatological: No rash, lesions/masses Respiratory: No cough, dyspnea Urologic: No hematuria, dysuria Abdominal:   No nausea, vomiting, diarrhea, bright red blood per rectum, melena, or  hematemesis Neurologic:  No visual changes, wkns, changes in mental status. All other systems reviewed and are otherwise negative except as noted above.  Physical Exam  Blood pressure 144/86, pulse 98, height $RemoveBe'5\' 5"'iBDNrVazs$  (1.651 m), weight 219 lb (99.338 kg), last menstrual period 09/10/2013, SpO2 97.00%.  General: Pleasant, NAD Psych: Normal affect. Neuro: Alert and oriented X 3. Moves all extremities spontaneously. HEENT: Normal  Neck: Supple without bruits or JVD. Lungs:  Resp regular and unlabored, CTA. Heart: RRR no s3, s4, or murmurs. Abdomen: Soft, non-tender, non-distended, BS + x 4.  Extremities: No clubbing, cyanosis or edema. DP/PT/Radials 2+ and equal bilaterally.  Labs:  No results found for this basename: CKTOTAL, CKMB, TROPONINI,  in the last 72 hours Lab Results  Component Value Date  WBC 10.8* 10/12/2013   HGB 7.9* 10/12/2013   HCT 26.6* 10/12/2013   MCV 65.2* 10/12/2013   PLT 412* 10/12/2013    No results found for this basename: DDIMER   No components found with this basename: POCBNP,     Component Value Date/Time   NA 144 10/12/2013 1039   NA 141 10/01/2013 0928   NA 141 04/12/2013 1026   K 3.7 10/12/2013 1039   K 3.7 10/01/2013 0928   CL 103 10/01/2013 0928   CO2 23 10/12/2013 1039   CO2 23 10/01/2013 0928   GLUCOSE 45* 10/12/2013 1039   GLUCOSE 70 10/01/2013 0928   GLUCOSE 94 04/12/2013 1026   BUN 11.4 10/12/2013 1039   BUN 9 10/01/2013 0928   BUN 12 04/12/2013 1026   CREATININE 0.8 10/12/2013 1039   CREATININE 0.70 10/01/2013 0928   CREATININE 0.76 03/20/2011 1302   CALCIUM 8.8 10/12/2013 1039   CALCIUM 8.7 10/01/2013 0928   PROT 7.2 10/12/2013 1039   PROT 7.5 10/01/2013 0928   PROT 6.8 04/12/2013 1026   ALBUMIN 2.9* 10/12/2013 1039   ALBUMIN 2.9* 10/01/2013 0928   AST 63* 10/12/2013 1039   AST 136* 10/01/2013 0928   ALT 44 10/12/2013 1039   ALT 66* 10/01/2013 0928   ALKPHOS 220* 10/12/2013 1039   ALKPHOS 203* 10/01/2013 0928   BILITOT 0.41 10/12/2013 1039   BILITOT  0.3 10/01/2013 0928   GFRNONAA >90 10/01/2013 0928   GFRAA >90 10/01/2013 0928   Lab Results  Component Value Date   CHOL 204* 03/20/2011   HDL 64 03/20/2011   LDLCALC 117* 03/20/2011   TRIG 114 03/20/2011    Accessory Clinical Findings  Echocardiogram - none  ECG - sinus tachycardia, LVH with repolarization abnormalities, lateral ischemia can't be excluded.    Assessment & Plan  A 43 year old female with prior medical history significant for non-insulin-dependent diabetes mellitus, obesity, hyperlipidemia and hypertension who is coming with symptoms of congestive heart failure of unknown etiology. We will order an echocardiogram to evaluate for systolic and diastolic function and possible wall motion abnormalities and to  evaluate for intracardiac pressures. The patient has symptoms of exertional chest pain and shortness of breath and abnormal EKG so we will order a Lexiscan nuclear stress test to evaluate for ischemia. We will divide her Lasix 80 mg daily in 2 doses, one in the morning and 1 in the afternoon. We will add metolazone 2.5 mg in the morning. Add KCl 20 mg po daily. We will add Bystolic 2.5 mg po daily.   BMP in 2 weeks. Follow up in 1 month.   Dorothy Spark, MD, [facility] 10/13/2013, 10:12 AM

## 2013-10-16 ENCOUNTER — Encounter: Payer: Self-pay | Admitting: Cardiology

## 2013-10-20 ENCOUNTER — Telehealth: Payer: Self-pay | Admitting: Internal Medicine

## 2013-10-20 ENCOUNTER — Ambulatory Visit (HOSPITAL_BASED_OUTPATIENT_CLINIC_OR_DEPARTMENT_OTHER): Payer: Medicare HMO | Admitting: Internal Medicine

## 2013-10-20 ENCOUNTER — Other Ambulatory Visit (HOSPITAL_BASED_OUTPATIENT_CLINIC_OR_DEPARTMENT_OTHER): Payer: Medicare HMO

## 2013-10-20 VITALS — BP 133/91 | HR 86 | Temp 98.3°F | Resp 18 | Ht 65.0 in | Wt 208.3 lb

## 2013-10-20 DIAGNOSIS — N920 Excessive and frequent menstruation with regular cycle: Secondary | ICD-10-CM

## 2013-10-20 DIAGNOSIS — I509 Heart failure, unspecified: Secondary | ICD-10-CM

## 2013-10-20 DIAGNOSIS — K921 Melena: Secondary | ICD-10-CM

## 2013-10-20 DIAGNOSIS — D500 Iron deficiency anemia secondary to blood loss (chronic): Secondary | ICD-10-CM

## 2013-10-20 DIAGNOSIS — E119 Type 2 diabetes mellitus without complications: Secondary | ICD-10-CM

## 2013-10-20 DIAGNOSIS — Z9884 Bariatric surgery status: Secondary | ICD-10-CM

## 2013-10-20 DIAGNOSIS — Z862 Personal history of diseases of the blood and blood-forming organs and certain disorders involving the immune mechanism: Secondary | ICD-10-CM

## 2013-10-20 LAB — CBC WITH DIFFERENTIAL/PLATELET
BASO%: 0.9 % (ref 0.0–2.0)
Basophils Absolute: 0.1 10*3/uL (ref 0.0–0.1)
EOS ABS: 0.5 10*3/uL (ref 0.0–0.5)
EOS%: 5.5 % (ref 0.0–7.0)
HCT: 33.1 % — ABNORMAL LOW (ref 34.8–46.6)
HGB: 9.9 g/dL — ABNORMAL LOW (ref 11.6–15.9)
LYMPH#: 2.1 10*3/uL (ref 0.9–3.3)
LYMPH%: 25.8 % (ref 14.0–49.7)
MCH: 21.2 pg — ABNORMAL LOW (ref 25.1–34.0)
MCHC: 30 g/dL — ABNORMAL LOW (ref 31.5–36.0)
MCV: 70.7 fL — ABNORMAL LOW (ref 79.5–101.0)
MONO#: 0.5 10*3/uL (ref 0.1–0.9)
MONO%: 5.8 % (ref 0.0–14.0)
NEUT%: 62 % (ref 38.4–76.8)
NEUTROS ABS: 5.2 10*3/uL (ref 1.5–6.5)
PLATELETS: 304 10*3/uL (ref 145–400)
RBC: 4.67 10*6/uL (ref 3.70–5.45)
RDW: 17.4 % — ABNORMAL HIGH (ref 11.2–14.5)
WBC: 8.3 10*3/uL (ref 3.9–10.3)

## 2013-10-20 NOTE — Telephone Encounter (Signed)
Gave pt appt for lab and MD for June and july 2015

## 2013-10-20 NOTE — Progress Notes (Signed)
[facility] OFFICE PROGRESS NOTE  Linda Sake, MD Dr. Cristela Blue [place] [address]  DIAGNOSIS: History of iron deficiency anemia  History of gastric bypass  Hematochezia  Chief Complaint  Patient presents with  . iron deficiency anemia    CURRENT TREATMENT:  Feraheme 1,020 mg prn.   INTERVAL HISTORY: [name] 43 y.o. female with a history of IDA secondary heavy menses and also a history of gastric bypass is here for one week follow up.  She was seen on 10/12/2013.  Her hemoglobin was 7.9 and she received feraheme 1,020 mg.   She reports increased energy.  She is having difficulties sleeping related to her recent diagnosis of CHF.  She reports an extended family history of CHF. She was started on coreg and lasix.  Her ace inhibitor was discontinued due to cough.  She is now on benicar.   Of note, she presented [facility] emergency room on 10/01/2013 with dyspnea and low energy. At that time, her hemoglobin was in the high 7s. She was recommended for hematology referral because the patient was taking her iron supplement as recommended for the past several months. She was taking ferrous sulfate 325 mg tid with orange juice per her PCP. At the emergency room her hemoglobin was 8.4, and she was not transfused. Her FOBT test was negative. Liver function tests were elevated (AST 136, ALT 66, Alk phos 203) and pro-BNP was high at 1711. CXR showed cardiomegaly, mild vascular congestion, small bilateral effusions, possibly early CHF. She is scheduled to see Dr. Athena Masse of [facility] tomorrow. Her lasix has increased from 40 mg daily to 80 mg daily due to lower extremity swelling. She was discharged with follow up with cardiology and hematology. She saw Dr. Lisbeth Ply on 10/03/2013.   MEDICAL HISTORY: Past Medical History  Diagnosis Date  . Arthritis   . Asthma   . GERD (gastroesophageal reflux disease)   . Diabetes  mellitus   . Hyperlipidemia   . Migraines   . Hypertension   . Back pain, chronic   . Depression   . Neuropathy   . Anxiety   . Nervous breakdown sept 2013  . Fibromyalgia   . History of gastric bypass Jan 2013  . Insomnia   . Anemia     INTERIM HISTORY: has Morbid obesity; IDDM (insulin dependent diabetes mellitus); Degenerative disc disease; Nausea and vomiting; Major depressive disorder, recurrent episode, moderate; Compulsive behavior disorder; History of gastric bypass; Anxiety disorder; Nervous breakdown; Hematochezia; Anemia; DM type 2 (diabetes mellitus, type 2); Generalized weakness; Generalized abdominal pain; Hyperactive bowel sounds; Dehydration; Insomnia; Diabetic neuropathy; Migraine, unspecified, without mention of intractable migraine without mention of status migrainosus; and History of iron deficiency anemia on her problem list.    ALLERGIES:  is allergic to imitrex.  MEDICATIONS: has a current medication list which includes the following prescription(s): albuterol, biotin, bupropion, citalopram, fluticasone, furosemide, insulin glargine, lidocaine, metolazone, nebivolol, olmesartan, oxycodone-acetaminophen, polyethylene glycol, potassium chloride sa, pregabalin, ranitidine, tizanidine, voltaren, and lubiprostone.  SURGICAL HISTORY:  Past Surgical History  Procedure Laterality Date  . Back surgery  November 1992  . Cesarean section  December 1998  . Tubal ligation  January 1999  . Cholecystectomy  1998  . Gastric roux-en-y  06/30/2011    Procedure: LAPAROSCOPIC ROUX-EN-Y GASTRIC;  Surgeon: Edward Jolly, MD;  Location: [facility];  Service: General;  Laterality: N/A;     . Esophagogastroduodenoscopy  07/27/2011  Procedure: ESOPHAGOGASTRODUODENOSCOPY (EGD);  Surgeon: Landry Dyke, MD;  Location: [facility];  Service: Endoscopy;  Laterality: N/A;    REVIEW OF SYSTEMS:   Constitutional: Denies fevers, chills or abnormal weight loss; Positive fatigue Eyes:  Denies blurriness of vision Ears, nose, mouth, throat, and face: Denies mucositis or sore throat Respiratory: Denies cough, dyspnea or wheezes Cardiovascular: Denies palpitation, chest discomfort or lower extremity swelling Gastrointestinal:  Denies nausea, heartburn or change in bowel habits Skin: Denies abnormal skin rashes Lymphatics: Denies new lymphadenopathy or easy bruising Neurological:Denies numbness, tingling or new weaknesses Behavioral/Psych: Mood is stable, no new changes  All other systems were reviewed with the patient and are negative.  PHYSICAL EXAMINATION: ECOG PERFORMANCE STATUS: 0 - Asymptomatic  Blood pressure 133/91, pulse 86, temperature 98.3 F (36.8 C), temperature source Oral, resp. rate 18, height $RemoveBe'5\' 5"'twguTJGMj$  (1.651 m), weight 208 lb 4.8 oz (94.484 kg), last menstrual period 09/10/2013, SpO2 100.00%.  GENERAL:alert, no distress and comfortable; moderately obese SKIN: skin color, texture, turgor are normal, no rashes or significant lesions EYES: normal, Conjunctiva are pink and non-injected, sclera clear OROPHARYNX:no exudate, no erythema and lips, buccal mucosa, and tongue normal  NECK: supple, thyroid normal size, non-tender, without nodularity LYMPH:  no palpable lymphadenopathy in the cervical, axillary or supraclavicular LUNGS: clear to auscultation with normal breathing effort, no wheezes or rhonchi HEART: regular rate & rhythm and no murmurs and no lower extremity edema ABDOMEN:abdomen soft, non-tender and normal bowel sounds Musculoskeletal:no cyanosis of digits and no clubbing  NEURO: alert & oriented x 3 with fluent speech, no focal motor/sensory deficits  Labs:  Lab Results  Component Value Date   WBC 8.3 10/20/2013   HGB 9.9* 10/20/2013   HCT 33.1* 10/20/2013   MCV 70.7* 10/20/2013   PLT 304 10/20/2013   NEUTROABS 5.2 10/20/2013      Chemistry      Component Value Date/Time   NA 144 10/12/2013 1039   NA 141 10/01/2013 0928   NA 141 04/12/2013 1026   K  3.7 10/12/2013 1039   K 3.7 10/01/2013 0928   CL 103 10/01/2013 0928   CO2 23 10/12/2013 1039   CO2 23 10/01/2013 0928   BUN 11.4 10/12/2013 1039   BUN 9 10/01/2013 0928   BUN 12 04/12/2013 1026   CREATININE 0.8 10/12/2013 1039   CREATININE 0.70 10/01/2013 0928   CREATININE 0.76 03/20/2011 1302      Component Value Date/Time   CALCIUM 8.8 10/12/2013 1039   CALCIUM 8.7 10/01/2013 0928   ALKPHOS 220* 10/12/2013 1039   ALKPHOS 203* 10/01/2013 0928   AST 63* 10/12/2013 1039   AST 136* 10/01/2013 0928   ALT 44 10/12/2013 1039   ALT 66* 10/01/2013 0928   BILITOT 0.41 10/12/2013 1039   BILITOT 0.3 10/01/2013 0928        CBC:  Recent Labs Lab 10/20/13 0829  WBC 8.3  NEUTROABS 5.2  HGB 9.9*  HCT 33.1*  MCV 70.7*  PLT 304    Anemia work up No results found for this basename: VITAMINB12, FOLATE, FERRITIN, TIBC, IRON, RETICCTPCT,  in the last 72 hours  Studies:  No results found.   RADIOGRAPHIC STUDIES: Dg Chest 2 View  10/01/2013   CLINICAL DATA:  Shortness of breath.  Asthma.  Hypertension.  EXAM: CHEST  2 VIEW  COMPARISON:  03/31/2011.  FINDINGS: Cardiomegaly. Low lung volumes. Mild vascular congestion. Small bilateral effusions. Early CHF is not excluded. Bilateral pneumonia could have this appearance.  Compared with priors,  the aeration is worse.  IMPRESSION: Cardiomegaly. Bilateral pulmonary opacities. Early CHF not excluded.   Electronically Signed   By: Rolla Flatten M.D.   On: 10/01/2013 10:09    ASSESSMENT: [name] 43 y.o. female with a history of History of iron deficiency anemia  History of gastric bypass  Hematochezia   PLAN:  1. Severe IDA secondary to #2.  --Her iron indices demonstrated iron level of 16 and ferritin of 9 and an elevated UIBC of 425. Her hemoglobin is 7.9 with a low MCV of 65.2. She has reticulocyte percent is 1.47. She has two factor contributing to iron deficiency secondary to menorrhagia and/or s/p gastric bypass surgery. GI workup including FOBT  was negative. We gave her feraheme 1,020 mg intravenously  On 04/30 and now her hemoglobin is 9.9. We will repeat labs monthly and follow ferritin, iron studies.   2. Menorrhagia.  -- Referral to Gynecology. She will likely require an ultrasound to rule of fibroids or polyps.   3. Early CHF.  --Patient is following with [facility].   4. DM2 -- Continue management per PCP.    5. Follow up.  --She will follow up with CBC and iron studies monthly with a symptom visit in 2 months.   All questions were answered. The patient knows to call the clinic with any problems, questions or concerns. We can certainly see the patient much sooner if necessary.  I spent 10 minutes counseling the patient face to face. The total time spent in the appointment was 15 minutes.    Concha Norway, MD 10/20/2013 8:58 AM

## 2013-10-28 ENCOUNTER — Emergency Department: Payer: Self-pay | Admitting: Emergency Medicine

## 2013-11-01 ENCOUNTER — Ambulatory Visit (HOSPITAL_COMMUNITY): Payer: Medicare HMO | Attending: Internal Medicine | Admitting: Radiology

## 2013-11-01 ENCOUNTER — Other Ambulatory Visit (INDEPENDENT_AMBULATORY_CARE_PROVIDER_SITE_OTHER): Payer: Medicare HMO

## 2013-11-01 ENCOUNTER — Ambulatory Visit (HOSPITAL_BASED_OUTPATIENT_CLINIC_OR_DEPARTMENT_OTHER): Payer: Medicare HMO | Admitting: Radiology

## 2013-11-01 VITALS — BP 105/63 | Ht 65.0 in | Wt 206.0 lb

## 2013-11-01 DIAGNOSIS — R0609 Other forms of dyspnea: Secondary | ICD-10-CM | POA: Insufficient documentation

## 2013-11-01 DIAGNOSIS — I5031 Acute diastolic (congestive) heart failure: Secondary | ICD-10-CM

## 2013-11-01 DIAGNOSIS — R079 Chest pain, unspecified: Secondary | ICD-10-CM | POA: Insufficient documentation

## 2013-11-01 DIAGNOSIS — Z794 Long term (current) use of insulin: Secondary | ICD-10-CM | POA: Insufficient documentation

## 2013-11-01 DIAGNOSIS — J45909 Unspecified asthma, uncomplicated: Secondary | ICD-10-CM | POA: Insufficient documentation

## 2013-11-01 DIAGNOSIS — R0989 Other specified symptoms and signs involving the circulatory and respiratory systems: Secondary | ICD-10-CM | POA: Insufficient documentation

## 2013-11-01 DIAGNOSIS — R0602 Shortness of breath: Secondary | ICD-10-CM

## 2013-11-01 DIAGNOSIS — I10 Essential (primary) hypertension: Secondary | ICD-10-CM | POA: Insufficient documentation

## 2013-11-01 DIAGNOSIS — E119 Type 2 diabetes mellitus without complications: Secondary | ICD-10-CM | POA: Insufficient documentation

## 2013-11-01 DIAGNOSIS — R002 Palpitations: Secondary | ICD-10-CM | POA: Insufficient documentation

## 2013-11-01 DIAGNOSIS — Z8249 Family history of ischemic heart disease and other diseases of the circulatory system: Secondary | ICD-10-CM | POA: Insufficient documentation

## 2013-11-01 DIAGNOSIS — I509 Heart failure, unspecified: Secondary | ICD-10-CM

## 2013-11-01 LAB — BASIC METABOLIC PANEL
BUN: 38 mg/dL — ABNORMAL HIGH (ref 6–23)
CO2: 24 mEq/L (ref 19–32)
Calcium: 8.8 mg/dL (ref 8.4–10.5)
Chloride: 105 mEq/L (ref 96–112)
Creatinine, Ser: 1.1 mg/dL (ref 0.4–1.2)
GFR: 69.9 mL/min (ref >60.00)
Glucose, Bld: 83 mg/dL (ref 70–99)
Potassium: 4.2 mEq/L (ref 3.5–5.1)
Sodium: 137 mEq/L (ref 135–145)

## 2013-11-01 MED ORDER — REGADENOSON 0.4 MG/5ML IV SOLN
0.4000 mg | Freq: Once | INTRAVENOUS | Status: AC
  Administered 2013-11-01: 0.4 mg via INTRAVENOUS

## 2013-11-01 MED ORDER — TECHNETIUM TC 99M SESTAMIBI GENERIC - CARDIOLITE
10.8000 | Freq: Once | INTRAVENOUS | Status: AC | PRN
  Administered 2013-11-01: 11 via INTRAVENOUS

## 2013-11-01 MED ORDER — TECHNETIUM TC 99M SESTAMIBI GENERIC - CARDIOLITE
33.0000 | Freq: Once | INTRAVENOUS | Status: AC | PRN
  Administered 2013-11-01: 33 via INTRAVENOUS

## 2013-11-01 MED ORDER — AMINOPHYLLINE 25 MG/ML IV SOLN
75.0000 mg | Freq: Once | INTRAVENOUS | Status: AC
  Administered 2013-11-01: 75 mg via INTRAVENOUS

## 2013-11-01 NOTE — Progress Notes (Signed)
[facility] SITE 3 NUCLEAR MED [address] [place], [place] [address] [phone]    Cardiology Nuclear Med Study  [name] is a 43 y.o. female     MRN : [id number]     DOB: [birth year]  Procedure Date: 11/01/2013  Nuclear Med Background Indication for Stress Test:  Evaluation for Ischemia and Post Hospital4/15 Dyspnea History:  Asthma, No H/O CAD  Cardiac Risk Factors: Family History - CAD, Hypertension and IDDM Type 2  Symptoms:  Chest Pain, DOE, Palpitations and SOB   Nuclear Pre-Procedure Caffeine/Decaff Intake:  None NPO After: 6:00 pm   Lungs:  clear O2 Sat: 98% on room air. IV 0.9% NS with Angio Cath:  22g  IV Site: R Wrist  IV Started by:  Bonnita Levan, RN  Chest Size (in):  42 Cup Size: D  Height: 5\' 5"  (1.651 m)  Weight:  206 lb (93.441 kg)  BMI:  Body mass index is 34.28 kg/(m^2). Tech Comments: Aminophylline 75 mg IV given for symptoms. All were resolved within 10 minutes. Norris Cross EMTP    Nuclear Med Study 1 or 2 day study: 1 day  Stress Test Type:  Eugenie Birks  Reading MD: N/A  Order Authorizing Provider:  Tobias Alexander, MD  Resting Radionuclide: Technetium 68m Sestamibi  Resting Radionuclide Dose: 11.0 mCi   Stress Radionuclide:  Technetium 16m Sestamibi  Stress Radionuclide Dose: 33.0 mCi           Stress Protocol Rest HR: 76 Stress HR: 83  Rest BP: 105/63 Stress BP: 105/79  Exercise Time (min): n/a METS: n/a   Predicted Max HR: 178 bpm % Max HR: 46.63 bpm Rate Pressure Product: 8715   Dose of Adenosine (mg):  n/a Dose of Lexiscan: 0.4 mg  Dose of Atropine (mg): n/a Dose of Dobutamine: n/a mcg/kg/min (at max HR)  Stress Test Technologist: Milana Na, EMT-P  Nuclear Technologist:  Domenic Polite, CNMT     Rest Procedure:  Myocardial perfusion imaging was performed at rest 45 minutes following the intravenous administration of Technetium 9m Sestamibi. Rest ECG: NSR - Normal EKG and NSR-LVH  Stress Procedure:  The  patient received IV Lexiscan 0.4 mg over 15-seconds.  Technetium 1m Sestamibi injected at 30-seconds. This patient had sob was lt. Headed and felt funny all over with the Lexiscan injection. Quantitative spect images were obtained after a 45 minute delay. Stress ECG: No significant change from baseline ECG  QPS Raw Data Images:  Cardiomegaly is seen. Stress Images:  patchy mild perfusion abnormalities are seen, that do not follow coronary distribution Rest Images:  Comparison with the stress images reveals no significant change. Subtraction (SDS):  No evidence of ischemia. Transient Ischemic Dilatation (Normal <1.22):  0.99 Lung/Heart Ratio (Normal <0.45):  0.27  Quantitative Gated Spect Images QGS EDV:  181 ml QGS ESV:  135 ml  Impression Exercise Capacity:  Lexiscan with no exercise. BP Response:  Normal blood pressure response. Clinical Symptoms:  No significant symptoms noted. ECG Impression:  No significant ECG changes with Lexiscan. Comparison with Prior Nuclear Study: No previous nuclear study performed  Overall Impression:  Normal stress nuclear perfusion study. Findings are most compatible with nonischemic cardiomyopathy.  LV Ejection Fraction: 26%.  LV Wall Motion:  Global LV hypokinesis.  Thurmon Fair, MD, [facility] [phone] office [phone] pager

## 2013-11-01 NOTE — Progress Notes (Signed)
Echocardiogram performed.  

## 2013-11-02 ENCOUNTER — Encounter: Payer: Self-pay | Admitting: *Deleted

## 2013-11-02 ENCOUNTER — Encounter: Payer: Self-pay | Admitting: Cardiology

## 2013-11-02 ENCOUNTER — Encounter (INDEPENDENT_AMBULATORY_CARE_PROVIDER_SITE_OTHER): Payer: Medicare HMO

## 2013-11-02 ENCOUNTER — Ambulatory Visit (INDEPENDENT_AMBULATORY_CARE_PROVIDER_SITE_OTHER): Payer: Medicare HMO | Admitting: Cardiology

## 2013-11-02 VITALS — BP 123/78 | HR 96 | Ht 65.5 in | Wt 207.8 lb

## 2013-11-02 DIAGNOSIS — Z794 Long term (current) use of insulin: Secondary | ICD-10-CM

## 2013-11-02 DIAGNOSIS — E119 Type 2 diabetes mellitus without complications: Secondary | ICD-10-CM

## 2013-11-02 DIAGNOSIS — I5023 Acute on chronic systolic (congestive) heart failure: Secondary | ICD-10-CM

## 2013-11-02 DIAGNOSIS — IMO0001 Reserved for inherently not codable concepts without codable children: Secondary | ICD-10-CM

## 2013-11-02 DIAGNOSIS — I509 Heart failure, unspecified: Secondary | ICD-10-CM

## 2013-11-02 DIAGNOSIS — I5021 Acute systolic (congestive) heart failure: Secondary | ICD-10-CM

## 2013-11-02 DIAGNOSIS — R002 Palpitations: Secondary | ICD-10-CM

## 2013-11-02 MED ORDER — ASPIRIN EC 81 MG PO TBEC: 81.0000 mg | DELAYED_RELEASE_TABLET | Freq: Every day | ORAL | Status: DC

## 2013-11-02 NOTE — Progress Notes (Signed)
Patient ID: [name], female   DOB: [birth year], 43 y.o.   MRN: [id number]    Patient Name: [name] Date of Encounter: 11/02/2013  Primary Care Provider:  [name], MD Primary Cardiologist:  [name]   Problem List   Past Medical History  Diagnosis Date  . Arthritis   . Asthma   . GERD (gastroesophageal reflux disease)   . Diabetes mellitus   . Hyperlipidemia   . Migraines   . Hypertension   . Back pain, chronic   . Depression   . Neuropathy   . Anxiety   . Nervous breakdown sept 2013  . Fibromyalgia   . History of gastric bypass Jan 2013  . Insomnia   . Anemia    Past Surgical History  Procedure Laterality Date  . Back surgery  November 1992  . Cesarean section  December 1998  . Tubal ligation  January 1999  . Cholecystectomy  1998  . Gastric roux-en-y  06/30/2011    Procedure: LAPAROSCOPIC ROUX-EN-Y GASTRIC;  Surgeon: Linda Jolly, MD;  Location: [facility];  Service: General;  Laterality: N/A;     . Esophagogastroduodenoscopy  07/27/2011    Procedure: ESOPHAGOGASTRODUODENOSCOPY (EGD);  Surgeon: Linda Dyke, MD;  Location: [facility];  Service: Endoscopy;  Laterality: N/A;    Allergies  Allergies  Allergen Reactions  . Imitrex [Sumatriptan] Nausea Only    Can not take the pill but she take the injection    HPI  A 43 year old female with prior medical history of obesity status post gastric bypass surgery, diabetes mellitus x 17 years and hypertension. The patient recently presented to the emergency room on 10/01/2013 with dyspnea and low energy. At that time, her hemoglobin was 8.4, and she was not transfused. Her FOBT test was negative. Liver function tests were elevated (AST 136, ALT 66, Alk phos 203) and pro-BNP was high at 1711. CXR showed cardiomegaly, mild vascular congestion, small bilateral effusions.  At bedtime this was his first diagnosis of congestive heart failure and she was started on treatment with diuretics.  She was afterwards refer to my clinic. She has been on Lasix 40 mg for the last 3 months that was increased to 80 mg daily in the ER.  The patient states that she has been experiencing dyspnea on exertion and lower extremity edema for at least 3 months. It has progressed over the last couple of weeks when she started to develop orthopnea currently 4-5 pillows with minimal improvement after initiation an increase of Lasix. She reports that some of her lower extremity edema has improved however orthopnea is at the same degree. She was prescribed lisinopril that gave her dry cough and was switched to Benicar 20 mg po daily.  She admits to occasional exertional chest tightness that is alleviated at rest. No palpitations or syncope. She has never smoked and does have significant family history of heart failure or premature coronary artery disease. The patient states that her sister died at age of 15 secondary to congestive heart failure. She underwent cardiac transplant and died shortly after. She is unsure what diagnosis she exactly had but states that she was also a heavy alcohol drinker. She has multiple family members with premature coronary artery disease she has 3 uncles that had bypass surgery one of them was in his 47s the other in his 66s. Her grandfather also had coronary artery bypass surgery in his 23s.  At the last visit she was significantly fluid overloaded and  we added metolazone 2.5 mg daily to her regimen. We also added by Bystolic 2.5 mg daily as she has a history of asthma.  She is coming after 3 weeks with no improvement and still feels profoundly short of breath even when walking short distances at home such as walking one flight of stairs. She feels profoundly tired all the time. She is experiencing paroxysmal nocturnal dyspnea and has ongoing lower extremity edema.  Home Medications  Prior to Admission medications   Medication Sig Start Date End Date Taking? Authorizing Provider    albuterol (PROVENTIL HFA;VENTOLIN HFA) 108 (90 BASE) MCG/ACT inhaler Inhale 2 puffs into the lungs every 6 (six) hours as needed. WHEEZING    Yes Historical Provider, MD  Biotin 1000 MCG tablet Take 1,000 mcg by mouth daily.   Yes Historical Provider, MD  buPROPion (WELLBUTRIN XL) 300 MG 24 hr tablet Take 300 mg by mouth daily. 02/23/13  Yes Historical Provider, MD  citalopram (CELEXA) 20 MG tablet Take 1 tablet (20 mg total) by mouth daily. For depression. 02/19/12 06/04/14 Yes Linda O Walker, MD  ferrous fumarate (HEMOCYTE - 106 MG FE) 325 (106 FE) MG TABS tablet Take 1 tablet by mouth 2 (two) times daily.   Yes Historical Provider, MD  fluticasone (FLONASE) 50 MCG/ACT nasal spray Place 50 sprays into both nostrils daily.  03/07/13  Yes Historical Provider, MD  furosemide (LASIX) 80 MG tablet Take 80 mg by mouth daily.   Yes Historical Provider, MD  insulin glargine (LANTUS) 100 UNIT/ML injection Inject 74 Units into the skin at bedtime. For control of blood sugar 02/19/12  Yes Linda O Walker, MD  lidocaine (LIDODERM) 5 % Place 1 patch onto the skin daily as needed (for back pain).  03/21/13  Yes Historical Provider, MD  lubiprostone (AMITIZA) 24 MCG capsule Take 1 capsule (24 mcg total) by mouth 2 (two) times daily with a meal. 03/17/12  Yes Linda Akula, MD  oxyCODONE-acetaminophen (PERCOCET) 10-325 MG per tablet Take 1 tablet by mouth every 4 (four) hours as needed for pain.   Yes Historical Provider, MD  polyethylene glycol (MIRALAX / GLYCOLAX) packet Take 17 g by mouth 2 (two) times daily. 03/17/12  Yes Linda Akula, MD  pregabalin (LYRICA) 150 MG capsule Take 150 mg by mouth 3 (three) times daily.   Yes Historical Provider, MD  ranitidine (ZANTAC) 150 MG tablet Take 150 mg by mouth daily. 02/11/13  Yes Historical Provider, MD  tiZANidine (ZANAFLEX) 4 MG tablet Take 4 mg by mouth every 6 (six) hours as needed (for pain). 04/04/13  Yes Linda Yan, MD  VOLTAREN 1 % GEL Apply 2 g topically 4 (four) times  daily.  03/18/13  Yes Historical Provider, MD    Family History  Family History  Problem Relation Age of Onset  . Diabetes Maternal Aunt   . Diabetes Paternal Aunt   . Diabetes Paternal Grandfather     Social History  History   Social History  . Marital Status: Single    Spouse Name: N/A    Number of Children: N/A  . Years of Education: N/A   Occupational History  . Not on file.   Social History Main Topics  . Smoking status: Never Smoker   . Smokeless tobacco: Never Used  . Alcohol Use: No  . Drug Use: No  . Sexual Activity: No   Other Topics Concern  . Not on file   Social History Narrative  . No narrative on file       Review of Systems, as per HPI, otherwise negative General:  No chills, fever, night sweats or weight changes.  Cardiovascular:  No chest pain, dyspnea on exertion, edema, orthopnea, palpitations, paroxysmal nocturnal dyspnea. Dermatological: No rash, lesions/masses Respiratory: No cough, dyspnea Urologic: No hematuria, dysuria Abdominal:   No nausea, vomiting, diarrhea, bright red blood per rectum, melena, or hematemesis Neurologic:  No visual changes, wkns, changes in mental status. All other systems reviewed and are otherwise negative except as noted above.  Physical Exam  Blood pressure 123/78, pulse 96, height 5' 5.5" (1.664 m), weight 207 lb 12.8 oz (94.257 kg).  General: Pleasant, NAD Psych: Normal affect. Neuro: Alert and oriented X 3. Moves all extremities spontaneously. HEENT: Normal  Neck: Supple without bruits or JVD. Lungs:  Resp regular and unlabored, mild crackles at the bases.Marland Kitchen Heart: RRR no s3, s4, or murmurs. Abdomen: Soft, non-tender, non-distended, BS + x 4.  Extremities: No clubbing, cyanosis , bilateral pitting lower extremity edema +1. DP/PT/Radials 2+ and equal bilaterally.  Labs:  No results found for this basename: CKTOTAL, CKMB, TROPONINI,  in the last 72 hours Lab Results  Component Value Date   WBC 8.3  10/20/2013   HGB 9.9* 10/20/2013   HCT 33.1* 10/20/2013   MCV 70.7* 10/20/2013   PLT 304 10/20/2013    No results found for this basename: DDIMER   No components found with this basename: POCBNP,     Component Value Date/Time   NA 137 11/01/2013 0925   NA 144 10/12/2013 1039   NA 141 04/12/2013 1026   K 4.2 11/01/2013 0925   K 3.7 10/12/2013 1039   CL 105 11/01/2013 0925   CO2 24 11/01/2013 0925   CO2 23 10/12/2013 1039   GLUCOSE 83 11/01/2013 0925   GLUCOSE 45* 10/12/2013 1039   GLUCOSE 94 04/12/2013 1026   BUN 38* 11/01/2013 0925   BUN 11.4 10/12/2013 1039   BUN 12 04/12/2013 1026   CREATININE 1.1 11/01/2013 0925   CREATININE 0.8 10/12/2013 1039   CREATININE 0.76 03/20/2011 1302   CALCIUM 8.8 11/01/2013 0925   CALCIUM 8.8 10/12/2013 1039   PROT 7.2 10/12/2013 1039   PROT 7.5 10/01/2013 0928   PROT 6.8 04/12/2013 1026   ALBUMIN 2.9* 10/12/2013 1039   ALBUMIN 2.9* 10/01/2013 0928   AST 63* 10/12/2013 1039   AST 136* 10/01/2013 0928   ALT 44 10/12/2013 1039   ALT 66* 10/01/2013 0928   ALKPHOS 220* 10/12/2013 1039   ALKPHOS 203* 10/01/2013 0928   BILITOT 0.41 10/12/2013 1039   BILITOT 0.3 10/01/2013 0928   GFRNONAA >90 10/01/2013 0928   GFRAA >90 10/01/2013 0928   Lab Results  Component Value Date   CHOL 204* 03/20/2011   HDL 64 03/20/2011   LDLCALC 117* 03/20/2011   TRIG 114 03/20/2011    Accessory Clinical Findings  Echocardiogram - 11/01/13 Left ventricle: The cavity size was normal. Wall thickness was normal. Systolic function was severely reduced. The estimated ejection fraction was in the range of 20% to 25%. Diffuse hypokinesis. Features are consistent with a pseudonormal left ventricular filling pattern, with concomitant abnormal relaxation and increased filling pressure (grade 2 diastolic dysfunction). - Left atrium: The atrium was mildly dilated. - Atrial septum: No defect or patent foramen ovale was identified. - Right ventricle: The cavity size was normal. Wall thickness was normal.  Systolic function was normal.  ECG - sinus tachycardia, LVH with repolarization abnormalities, lateral ischemia can't be excluded.  Lexiscan nuclear stress test: Impression  Exercise  Capacity: Lexiscan with no exercise.  BP Response: Normal blood pressure response.  Clinical Symptoms: No significant symptoms noted.  ECG Impression: No significant ECG changes with Lexiscan.  Comparison with Prior Nuclear Study: No previous nuclear study performed  Overall Impression: Normal stress nuclear perfusion study. Findings are most compatible with nonischemic cardiomyopathy.  LV Ejection Fraction: 26%. LV Wall Motion: Global LV hypokinesis.  Mihai Croitoru, MD, FACC   Assessment & Plan  A 42-year-old female with prior medical history significant for non-insulin-dependent diabetes mellitus, obesity, hyperlipidemia and hypertension with new diagnoses of systolic congestive heart failure.   The echocardiogram showed normal left ventricular size with severely reduced function and left ventricular ejection fraction of 20-25%, and pseudonormal filling pattern. There is normal right ventricular function. The patient underwent a Lexiscan nuclear stress test that showed no scar and no ischemia. These findings are most consistent with nonischemic cardiomyopathy.  The patient has been on high doses of diuretics now for 4 months, on ACEI/ARB for the last month as well as beta blocker. There is no significant proven and she is still NYHA II-II class.   We'll schedule her for right and left sided cardiac cath the next week with Dr. McLean and decide about further therapy based on the results of the cath.    Aubreigh Fuerte H Barri Neidlinger, MD, FACC 11/02/2013, 3:38 PM      

## 2013-11-02 NOTE — Progress Notes (Signed)
Patient ID: [name], female   DOB: [birth year], 43 y.o.   MRN: [id number] EVO 48 hour holter monitor applied to patient.

## 2013-11-02 NOTE — Patient Instructions (Addendum)
Your physician has recommended you make the following change in your medication:   START TAKING ASPIRIN 81 MG DAILY  Your physician recommends that you return for lab work in: TODAY (TSH, BNP, PT/INR, LFTs)  Your physician has recommended that you wear a 48 hour holter monitor. Holter monitors are medical devices that record the heart's electrical activity. Doctors most often use these monitors to diagnose arrhythmias. Arrhythmias are problems with the speed or rhythm of the heartbeat. The monitor is a small, portable device. You can wear one while you do your normal daily activities. This is usually used to diagnose what is causing palpitations/syncope (passing out).  Your physician has requested that you have a cardiac catheterization. Cardiac catheterization is used to diagnose and/or treat various heart conditions. Doctors may recommend this procedure for a number of different reasons. The most common reason is to evaluate chest pain. Chest pain can be a symptom of coronary artery disease (CAD), and cardiac catheterization can show whether plaque is narrowing or blocking your heart's arteries. This procedure is also used to evaluate the valves, as well as measure the blood flow and oxygen levels in different parts of your heart. For further information please visit [email or web address] Please follow instruction sheet, as given. YOUR CATH WILL BE SCHEDULED FOR 8 AM ON November 14, 2013 WITH DR [facility]  AT [facility].  PLEASE SEE LETTER ATTACHED FOR INSTRUCTIONS

## 2013-11-03 DIAGNOSIS — I5022 Chronic systolic (congestive) heart failure: Secondary | ICD-10-CM | POA: Insufficient documentation

## 2013-11-03 LAB — TSH: TSH: 0.82 u[IU]/mL (ref 0.35–4.50)

## 2013-11-03 LAB — HEPATIC FUNCTION PANEL
ALT: 47 U/L — ABNORMAL HIGH (ref 0–35)
AST: 38 U/L — ABNORMAL HIGH (ref 0–37)
Albumin: 3.4 g/dL — ABNORMAL LOW (ref 3.5–5.2)
Alkaline Phosphatase: 187 U/L — ABNORMAL HIGH (ref 39–117)
Bilirubin, Direct: 0 mg/dL (ref 0.0–0.3)
Total Bilirubin: 0.2 mg/dL (ref 0.2–1.2)
Total Protein: 7.7 g/dL (ref 6.0–8.3)

## 2013-11-03 LAB — PROTIME-INR
INR: 1 ratio (ref 0.8–1.0)
Prothrombin Time: 11 s (ref 9.6–13.1)

## 2013-11-03 LAB — BRAIN NATRIURETIC PEPTIDE: Pro B Natriuretic peptide (BNP): 45 pg/mL (ref 0.0–100.0)

## 2013-11-03 NOTE — Addendum Note (Signed)
Addended by: Lars Masson on: 11/03/2013 07:51 AM   Modules accepted: Orders

## 2013-11-10 ENCOUNTER — Encounter (HOSPITAL_COMMUNITY): Payer: Self-pay | Admitting: Pharmacy Technician

## 2013-11-13 ENCOUNTER — Telehealth: Payer: Self-pay | Admitting: Cardiology

## 2013-11-13 NOTE — Telephone Encounter (Signed)
I spoke with the pt and answered her questions in regards to cardiac catheterization.

## 2013-11-13 NOTE — Telephone Encounter (Signed)
New message     Patient has questions regarding upcoming procedure on tomorrow heart cath.

## 2013-11-13 NOTE — Telephone Encounter (Signed)
Left message to call back  

## 2013-11-14 ENCOUNTER — Encounter (HOSPITAL_COMMUNITY)
Admission: RE | Disposition: A | Discharge status: Home / Self Care | Payer: Self-pay | Source: Ambulatory Visit | Attending: Cardiovascular Disease

## 2013-11-14 ENCOUNTER — Ambulatory Visit (HOSPITAL_COMMUNITY)
Admission: RE | Admit: 2013-11-14 | Discharge: 2013-11-14 | Disposition: A | Discharge status: Home / Self Care | Payer: Medicare HMO | Source: Ambulatory Visit | Attending: Cardiovascular Disease | Admitting: Cardiovascular Disease

## 2013-11-14 DIAGNOSIS — D649 Anemia, unspecified: Secondary | ICD-10-CM | POA: Insufficient documentation

## 2013-11-14 DIAGNOSIS — F329 Major depressive disorder, single episode, unspecified: Secondary | ICD-10-CM | POA: Insufficient documentation

## 2013-11-14 DIAGNOSIS — G43909 Migraine, unspecified, not intractable, without status migrainosus: Secondary | ICD-10-CM | POA: Insufficient documentation

## 2013-11-14 DIAGNOSIS — G609 Hereditary and idiopathic neuropathy, unspecified: Secondary | ICD-10-CM | POA: Insufficient documentation

## 2013-11-14 DIAGNOSIS — E669 Obesity, unspecified: Secondary | ICD-10-CM | POA: Insufficient documentation

## 2013-11-14 DIAGNOSIS — Z9884 Bariatric surgery status: Secondary | ICD-10-CM | POA: Insufficient documentation

## 2013-11-14 DIAGNOSIS — IMO0001 Reserved for inherently not codable concepts without codable children: Secondary | ICD-10-CM | POA: Insufficient documentation

## 2013-11-14 DIAGNOSIS — I10 Essential (primary) hypertension: Secondary | ICD-10-CM | POA: Insufficient documentation

## 2013-11-14 DIAGNOSIS — I5022 Chronic systolic (congestive) heart failure: Secondary | ICD-10-CM | POA: Diagnosis not present

## 2013-11-14 DIAGNOSIS — K219 Gastro-esophageal reflux disease without esophagitis: Secondary | ICD-10-CM | POA: Insufficient documentation

## 2013-11-14 DIAGNOSIS — I428 Other cardiomyopathies: Secondary | ICD-10-CM | POA: Diagnosis not present

## 2013-11-14 DIAGNOSIS — I509 Heart failure, unspecified: Secondary | ICD-10-CM | POA: Insufficient documentation

## 2013-11-14 DIAGNOSIS — I5021 Acute systolic (congestive) heart failure: Secondary | ICD-10-CM

## 2013-11-14 DIAGNOSIS — Z794 Long term (current) use of insulin: Secondary | ICD-10-CM | POA: Insufficient documentation

## 2013-11-14 DIAGNOSIS — E119 Type 2 diabetes mellitus without complications: Secondary | ICD-10-CM | POA: Insufficient documentation

## 2013-11-14 DIAGNOSIS — F411 Generalized anxiety disorder: Secondary | ICD-10-CM | POA: Insufficient documentation

## 2013-11-14 DIAGNOSIS — G4700 Insomnia, unspecified: Secondary | ICD-10-CM | POA: Insufficient documentation

## 2013-11-14 DIAGNOSIS — J45909 Unspecified asthma, uncomplicated: Secondary | ICD-10-CM | POA: Diagnosis not present

## 2013-11-14 DIAGNOSIS — E785 Hyperlipidemia, unspecified: Secondary | ICD-10-CM | POA: Insufficient documentation

## 2013-11-14 DIAGNOSIS — F3289 Other specified depressive episodes: Secondary | ICD-10-CM | POA: Insufficient documentation

## 2013-11-14 HISTORY — PX: LEFT AND RIGHT HEART CATHETERIZATION WITH CORONARY ANGIOGRAM: SHX5449

## 2013-11-14 LAB — POCT I-STAT 3, ART BLOOD GAS (G3+)
Acid-base deficit: 2 mmol/L (ref 0.0–2.0)
BICARBONATE: 24.4 meq/L — AB (ref 20.0–24.0)
O2 Saturation: 97 %
PH ART: 7.332 — AB (ref 7.350–7.450)
TCO2: 26 mmol/L (ref 0–100)
pCO2 arterial: 46 mmHg — ABNORMAL HIGH (ref 35.0–45.0)
pO2, Arterial: 93 mmHg (ref 80.0–100.0)

## 2013-11-14 LAB — CBC
HCT: 33.1 % — ABNORMAL LOW (ref 36.0–46.0)
HEMOGLOBIN: 10.4 g/dL — AB (ref 12.0–15.0)
MCH: 24 pg — ABNORMAL LOW (ref 26.0–34.0)
MCHC: 31.4 g/dL (ref 30.0–36.0)
MCV: 76.4 fL — ABNORMAL LOW (ref 78.0–100.0)
Platelets: 219 10*3/uL (ref 150–400)
RBC: 4.33 MIL/uL (ref 3.87–5.11)
WBC: 6.6 10*3/uL (ref 4.0–10.5)

## 2013-11-14 LAB — POCT ACTIVATED CLOTTING TIME: Activated Clotting Time: 121 seconds

## 2013-11-14 LAB — POCT I-STAT 3, VENOUS BLOOD GAS (G3P V)
Acid-base deficit: 2 mmol/L (ref 0.0–2.0)
Bicarbonate: 25 mEq/L — ABNORMAL HIGH (ref 20.0–24.0)
O2 Saturation: 69 %
PH VEN: 7.293 (ref 7.250–7.300)
PO2 VEN: 41 mmHg (ref 30.0–45.0)
TCO2: 27 mmol/L (ref 0–100)
pCO2, Ven: 51.6 mmHg — ABNORMAL HIGH (ref 45.0–50.0)

## 2013-11-14 LAB — GLUCOSE, CAPILLARY
GLUCOSE-CAPILLARY: 132 mg/dL — AB (ref 70–99)
Glucose-Capillary: 82 mg/dL (ref 70–99)

## 2013-11-14 SURGERY — LEFT AND RIGHT HEART CATHETERIZATION WITH CORONARY ANGIOGRAM
Anesthesia: LOCAL

## 2013-11-14 MED ORDER — OXYCODONE-ACETAMINOPHEN 5-325 MG PO TABS
ORAL_TABLET | ORAL | Status: AC
  Administered 2013-11-14: 1
  Filled 2013-11-14: qty 1

## 2013-11-14 MED ORDER — NITROGLYCERIN 0.2 MG/ML ON CALL CATH LAB
INTRAVENOUS | Status: AC
  Filled 2013-11-14: qty 1

## 2013-11-14 MED ORDER — HEPARIN SODIUM (PORCINE) 1000 UNIT/ML IJ SOLN
INTRAMUSCULAR | Status: AC
  Filled 2013-11-14: qty 1

## 2013-11-14 MED ORDER — SODIUM CHLORIDE 0.9 % IV SOLN
INTRAVENOUS | Status: DC
  Administered 2013-11-14: 09:00:00 via INTRAVENOUS

## 2013-11-14 MED ORDER — HEPARIN (PORCINE) IN NACL 2-0.9 UNIT/ML-% IJ SOLN
INTRAMUSCULAR | Status: AC
  Filled 2013-11-14: qty 1500

## 2013-11-14 MED ORDER — SODIUM CHLORIDE 0.9 % IJ SOLN: 3.0000 mL | Freq: Two times a day (BID) | INTRAMUSCULAR | Status: DC

## 2013-11-14 MED ORDER — OXYCODONE-ACETAMINOPHEN 10-325 MG PO TABS: 1.0000 | ORAL_TABLET | Freq: Four times a day (QID) | ORAL | Status: DC | PRN

## 2013-11-14 MED ORDER — ASPIRIN 81 MG PO CHEW
CHEWABLE_TABLET | ORAL | Status: AC
  Administered 2013-11-14: 81 mg via ORAL
  Filled 2013-11-14: qty 1

## 2013-11-14 MED ORDER — SODIUM CHLORIDE 0.9 % IJ SOLN: 3.0000 mL | INTRAMUSCULAR | Status: DC | PRN

## 2013-11-14 MED ORDER — FENTANYL CITRATE 0.05 MG/ML IJ SOLN
INTRAMUSCULAR | Status: AC
  Filled 2013-11-14: qty 2

## 2013-11-14 MED ORDER — ASPIRIN 81 MG PO CHEW
81.0000 mg | CHEWABLE_TABLET | ORAL | Status: AC
  Administered 2013-11-14: 81 mg via ORAL

## 2013-11-14 MED ORDER — SODIUM CHLORIDE 0.9 % IV SOLN: 250.0000 mL | INTRAVENOUS | Status: DC | PRN

## 2013-11-14 MED ORDER — LIDOCAINE HCL (PF) 1 % IJ SOLN
INTRAMUSCULAR | Status: AC
  Filled 2013-11-14: qty 30

## 2013-11-14 MED ORDER — MIDAZOLAM HCL 2 MG/2ML IJ SOLN
INTRAMUSCULAR | Status: AC
  Filled 2013-11-14: qty 2

## 2013-11-14 MED ORDER — ACETAMINOPHEN 325 MG PO TABS: 650.0000 mg | ORAL_TABLET | ORAL | Status: DC | PRN

## 2013-11-14 MED ORDER — VERAPAMIL HCL 2.5 MG/ML IV SOLN
INTRAVENOUS | Status: AC
  Filled 2013-11-14: qty 2

## 2013-11-14 MED ORDER — ONDANSETRON HCL 4 MG/2ML IJ SOLN: 4.0000 mg | Freq: Four times a day (QID) | INTRAMUSCULAR | Status: DC | PRN

## 2013-11-14 NOTE — H&P (View-Only) (Signed)
Patient ID: [name], female   DOB: [birth year], 43 y.o.   MRN: [id number]    Patient Name: [name] Date of Encounter: 11/02/2013  Primary Care Provider:  [name], MD Primary Cardiologist:  Dorothy Spark   Problem List   Past Medical History  Diagnosis Date  . Arthritis   . Asthma   . GERD (gastroesophageal reflux disease)   . Diabetes mellitus   . Hyperlipidemia   . Migraines   . Hypertension   . Back pain, chronic   . Depression   . Neuropathy   . Anxiety   . Nervous breakdown sept 2013  . Fibromyalgia   . History of gastric bypass Jan 2013  . Insomnia   . Anemia    Past Surgical History  Procedure Laterality Date  . Back surgery  November 1992  . Cesarean section  December 1998  . Tubal ligation  January 1999  . Cholecystectomy  1998  . Gastric roux-en-y  06/30/2011    Procedure: LAPAROSCOPIC ROUX-EN-Y GASTRIC;  Surgeon: Edward Jolly, MD;  Location: [facility];  Service: General;  Laterality: N/A;     . Esophagogastroduodenoscopy  07/27/2011    Procedure: ESOPHAGOGASTRODUODENOSCOPY (EGD);  Surgeon: Landry Dyke, MD;  Location: [facility];  Service: Endoscopy;  Laterality: N/A;    Allergies  Allergies  Allergen Reactions  . Imitrex [Sumatriptan] Nausea Only    Can not take the pill but she take the injection    HPI  A 43 year old female with prior medical history of obesity status post gastric bypass surgery, diabetes mellitus x 17 years and hypertension. The patient recently presented to the emergency room on 10/01/2013 with dyspnea and low energy. At that time, her hemoglobin was 8.4, and she was not transfused. Her FOBT test was negative. Liver function tests were elevated (AST 136, ALT 66, Alk phos 203) and pro-BNP was high at 1711. CXR showed cardiomegaly, mild vascular congestion, small bilateral effusions.  At bedtime this was his first diagnosis of congestive heart failure and she was started on treatment with diuretics.  She was afterwards refer to my clinic. She has been on Lasix 40 mg for the last 3 months that was increased to 80 mg daily in the ER.  The patient states that she has been experiencing dyspnea on exertion and lower extremity edema for at least 3 months. It has progressed over the last couple of weeks when she started to develop orthopnea currently 4-5 pillows with minimal improvement after initiation an increase of Lasix. She reports that some of her lower extremity edema has improved however orthopnea is at the same degree. She was prescribed lisinopril that gave her dry cough and was switched to Benicar 20 mg po daily.  She admits to occasional exertional chest tightness that is alleviated at rest. No palpitations or syncope. She has never smoked and does have significant family history of heart failure or premature coronary artery disease. The patient states that her sister died at age of 15 secondary to congestive heart failure. She underwent cardiac transplant and died shortly after. She is unsure what diagnosis she exactly had but states that she was also a heavy alcohol drinker. She has multiple family members with premature coronary artery disease she has 3 uncles that had bypass surgery one of them was in his 47s the other in his 66s. Her grandfather also had coronary artery bypass surgery in his 23s.  At the last visit she was significantly fluid overloaded and  we added metolazone 2.5 mg daily to her regimen. We also added by Bystolic 2.5 mg daily as she has a history of asthma.  She is coming after 3 weeks with no improvement and still feels profoundly short of breath even when walking short distances at home such as walking one flight of stairs. She feels profoundly tired all the time. She is experiencing paroxysmal nocturnal dyspnea and has ongoing lower extremity edema.  Home Medications  Prior to Admission medications   Medication Sig Start Date End Date Taking? Authorizing Provider    albuterol (PROVENTIL HFA;VENTOLIN HFA) 108 (90 BASE) MCG/ACT inhaler Inhale 2 puffs into the lungs every 6 (six) hours as needed. WHEEZING    Yes Historical Provider, MD  Biotin 1000 MCG tablet Take 1,000 mcg by mouth daily.   Yes Historical Provider, MD  buPROPion (WELLBUTRIN XL) 300 MG 24 hr tablet Take 300 mg by mouth daily. 02/23/13  Yes Historical Provider, MD  citalopram (CELEXA) 20 MG tablet Take 1 tablet (20 mg total) by mouth daily. For depression. 02/19/12 06/04/14 Yes Darrol Jump, MD  ferrous fumarate (HEMOCYTE - 106 MG FE) 325 (106 FE) MG TABS tablet Take 1 tablet by mouth 2 (two) times daily.   Yes Historical Provider, MD  fluticasone (FLONASE) 50 MCG/ACT nasal spray Place 50 sprays into both nostrils daily.  03/07/13  Yes Historical Provider, MD  furosemide (LASIX) 80 MG tablet Take 80 mg by mouth daily.   Yes Historical Provider, MD  insulin glargine (LANTUS) 100 UNIT/ML injection Inject 74 Units into the skin at bedtime. For control of blood sugar 02/19/12  Yes Darrol Jump, MD  lidocaine (LIDODERM) 5 % Place 1 patch onto the skin daily as needed (for back pain).  03/21/13  Yes Historical Provider, MD  lubiprostone (AMITIZA) 24 MCG capsule Take 1 capsule (24 mcg total) by mouth 2 (two) times daily with a meal. 03/17/12  Yes Hosie Poisson, MD  oxyCODONE-acetaminophen (PERCOCET) 10-325 MG per tablet Take 1 tablet by mouth every 4 (four) hours as needed for pain.   Yes Historical Provider, MD  polyethylene glycol (MIRALAX / GLYCOLAX) packet Take 17 g by mouth 2 (two) times daily. 03/17/12  Yes Hosie Poisson, MD  pregabalin (LYRICA) 150 MG capsule Take 150 mg by mouth 3 (three) times daily.   Yes Historical Provider, MD  ranitidine (ZANTAC) 150 MG tablet Take 150 mg by mouth daily. 02/11/13  Yes Historical Provider, MD  tiZANidine (ZANAFLEX) 4 MG tablet Take 4 mg by mouth every 6 (six) hours as needed (for pain). 04/04/13  Yes Marcial Pacas, MD  VOLTAREN 1 % GEL Apply 2 g topically 4 (four) times  daily.  03/18/13  Yes Historical Provider, MD    Family History  Family History  Problem Relation Age of Onset  . Diabetes Maternal Aunt   . Diabetes Paternal Aunt   . Diabetes Paternal Grandfather     Social History  History   Social History  . Marital Status: Single    Spouse Name: N/A    Number of Children: N/A  . Years of Education: N/A   Occupational History  . Not on file.   Social History Main Topics  . Smoking status: Never Smoker   . Smokeless tobacco: Never Used  . Alcohol Use: No  . Drug Use: No  . Sexual Activity: No   Other Topics Concern  . Not on file   Social History Narrative  . No narrative on file  Review of Systems, as per HPI, otherwise negative General:  No chills, fever, night sweats or weight changes.  Cardiovascular:  No chest pain, dyspnea on exertion, edema, orthopnea, palpitations, paroxysmal nocturnal dyspnea. Dermatological: No rash, lesions/masses Respiratory: No cough, dyspnea Urologic: No hematuria, dysuria Abdominal:   No nausea, vomiting, diarrhea, bright red blood per rectum, melena, or hematemesis Neurologic:  No visual changes, wkns, changes in mental status. All other systems reviewed and are otherwise negative except as noted above.  Physical Exam  Blood pressure 123/78, pulse 96, height 5' 5.5" (1.664 m), weight 207 lb 12.8 oz (94.257 kg).  General: Pleasant, NAD Psych: Normal affect. Neuro: Alert and oriented X 3. Moves all extremities spontaneously. HEENT: Normal  Neck: Supple without bruits or JVD. Lungs:  Resp regular and unlabored, mild crackles at the bases.Marland Kitchen Heart: RRR no s3, s4, or murmurs. Abdomen: Soft, non-tender, non-distended, BS + x 4.  Extremities: No clubbing, cyanosis , bilateral pitting lower extremity edema +1. DP/PT/Radials 2+ and equal bilaterally.  Labs:  No results found for this basename: CKTOTAL, CKMB, TROPONINI,  in the last 72 hours Lab Results  Component Value Date   WBC 8.3  10/20/2013   HGB 9.9* 10/20/2013   HCT 33.1* 10/20/2013   MCV 70.7* 10/20/2013   PLT 304 10/20/2013    No results found for this basename: DDIMER   No components found with this basename: POCBNP,     Component Value Date/Time   NA 137 11/01/2013 0925   NA 144 10/12/2013 1039   NA 141 04/12/2013 1026   K 4.2 11/01/2013 0925   K 3.7 10/12/2013 1039   CL 105 11/01/2013 0925   CO2 24 11/01/2013 0925   CO2 23 10/12/2013 1039   GLUCOSE 83 11/01/2013 0925   GLUCOSE 45* 10/12/2013 1039   GLUCOSE 94 04/12/2013 1026   BUN 38* 11/01/2013 0925   BUN 11.4 10/12/2013 1039   BUN 12 04/12/2013 1026   CREATININE 1.1 11/01/2013 0925   CREATININE 0.8 10/12/2013 1039   CREATININE 0.76 03/20/2011 1302   CALCIUM 8.8 11/01/2013 0925   CALCIUM 8.8 10/12/2013 1039   PROT 7.2 10/12/2013 1039   PROT 7.5 10/01/2013 0928   PROT 6.8 04/12/2013 1026   ALBUMIN 2.9* 10/12/2013 1039   ALBUMIN 2.9* 10/01/2013 0928   AST 63* 10/12/2013 1039   AST 136* 10/01/2013 0928   ALT 44 10/12/2013 1039   ALT 66* 10/01/2013 0928   ALKPHOS 220* 10/12/2013 1039   ALKPHOS 203* 10/01/2013 0928   BILITOT 0.41 10/12/2013 1039   BILITOT 0.3 10/01/2013 0928   GFRNONAA >90 10/01/2013 0928   GFRAA >90 10/01/2013 0928   Lab Results  Component Value Date   CHOL 204* 03/20/2011   HDL 64 03/20/2011   LDLCALC 117* 03/20/2011   TRIG 114 03/20/2011    Accessory Clinical Findings  Echocardiogram - 11/01/13 Left ventricle: The cavity size was normal. Wall thickness was normal. Systolic function was severely reduced. The estimated ejection fraction was in the range of 20% to 25%. Diffuse hypokinesis. Features are consistent with a pseudonormal left ventricular filling pattern, with concomitant abnormal relaxation and increased filling pressure (grade 2 diastolic dysfunction). - Left atrium: The atrium was mildly dilated. - Atrial septum: No defect or patent foramen ovale was identified. - Right ventricle: The cavity size was normal. Wall thickness was normal.  Systolic function was normal.  ECG - sinus tachycardia, LVH with repolarization abnormalities, lateral ischemia can't be excluded.  Lexiscan nuclear stress test: Impression  Exercise  Capacity: Lexiscan with no exercise.  BP Response: Normal blood pressure response.  Clinical Symptoms: No significant symptoms noted.  ECG Impression: No significant ECG changes with Lexiscan.  Comparison with Prior Nuclear Study: No previous nuclear study performed  Overall Impression: Normal stress nuclear perfusion study. Findings are most compatible with nonischemic cardiomyopathy.  LV Ejection Fraction: 26%. LV Wall Motion: Global LV hypokinesis.  Sanda Klein, MD, [place]  A 43 year old female with prior medical history significant for non-insulin-dependent diabetes mellitus, obesity, hyperlipidemia and hypertension with new diagnoses of systolic congestive heart failure.   The echocardiogram showed normal left ventricular size with severely reduced function and left ventricular ejection fraction of 20-25%, and pseudonormal filling pattern. There is normal right ventricular function. The patient underwent a Lexiscan nuclear stress test that showed no scar and no ischemia. These findings are most consistent with nonischemic cardiomyopathy.  The patient has been on high doses of diuretics now for 4 months, on ACEI/ARB for the last month as well as beta blocker. There is no significant proven and she is still NYHA II-II class.   We'll schedule her for right and left sided cardiac cath the next week with Dr. Aundra Dubin and decide about further therapy based on the results of the cath.    Dorothy Spark, MD, [facility] 11/02/2013, 3:38 PM

## 2013-11-14 NOTE — Discharge Instructions (Signed)
Arteriogram  An arteriogram (or angiogram) is an X-ray test of your blood vessels. You will be awake during the test. This test looks for:  Blocked blood vessels.  Blood vessels that are not normal. BEFORE THE TEST Schedule an appointment for your arteriogram. Let the personknow if:  You have diabetes.  You are allergic to any food or medicine.  You are allergic to X-ray dye (contrast).  You have asthma or had it when you were a child.  You are pregnant or you might be pregnant.  You are taking aspirin or blood thinners. The night before the test:   Do not  eat or drink anything after midnight. On the morning of your test:   Do not drive. Have someone bring you and take you home.  Bring all the medicines you need to take with you. If you have diabetes, do not take your insulin before the test. Take your medicines when the test is over.  You will need to sign a form that says you agree to have the test (consent form). THE TEST  You will lie on the X-ray table.  An IV tube is started in your arm.  Your blood pressure and heartbeat are checked during the test.  The upper part of your leg is shaved and washed with special soap.  You are then covered with a germ free (sterile) sheet. Keep your arms at your side under the sheet at all times so that germs do not get on the sheet.  The skin is numbed where the thin tube (catheter) is put into your upper leg. The thin tube is moved up into the blood vessels that your doctor wants to see.  Dye is put in through the tube. You may have a feeling of heat as the dye moves into your body.  X-ray pictures of your blood vessels are taken. Do not move while the dye goes in and pictures are taken. AFTER THE TEST  The thin tube will be taken out of your upper leg.  The nurse will check:  Your blood pressure.  The place where the thin tube was taken out to make sure it is not bleeding.  The blood flow to your feet.  Lie flat in  bed. Keep your leg straight for 6 hours after the thin tube is taken out. Finding out the results of your test Ask when your test results will be ready. Make sure you get your test results. Document Released: 08/28/2008 Document Revised: 08/24/2011 Document Reviewed: 08/28/2008 [facility] Patient Information 2014 [place], [place]. Radial Site Care Refer to this sheet in the next few weeks. These instructions provide you with information on caring for yourself after your procedure. Your caregiver may also give you more specific instructions. Your treatment has been planned according to current medical practices, but problems sometimes occur. Call your caregiver if you have any problems or questions after your procedure. HOME CARE INSTRUCTIONS  You may shower the day after the procedure.Remove the bandage (dressing) and gently wash the site with plain soap and water.Gently pat the site dry.  Do not apply powder or lotion to the site.  Do not submerge the affected site in water for 3 to 5 days.  Inspect the site at least twice daily.  Do not flex or bend the affected arm for 24 hours.  No lifting over 5 pounds (2.3 kg) for 5 days after your procedure.  Do not drive home if you are discharged the same day of  the procedure. Have someone else drive you.  You may drive 24 hours after the procedure unless otherwise instructed by your caregiver.  Do not operate machinery or power tools for 24 hours.  A responsible adult should be with you for the first 24 hours after you arrive home. What to expect:  Any bruising will usually fade within 1 to 2 weeks.  Blood that collects in the tissue (hematoma) may be painful to the touch. It should usually decrease in size and tenderness within 1 to 2 weeks. SEEK IMMEDIATE MEDICAL CARE IF:  You have unusual pain at the radial site.  You have redness, warmth, swelling, or pain at the radial site.  You have drainage (other than a small amount of blood  on the dressing).  You have chills.  You have a fever or persistent symptoms for more than 72 hours.  You have a fever and your symptoms suddenly get worse.  Your arm becomes pale, cool, tingly, or numb.  You have heavy bleeding from the site. Hold pressure on the site. Document Released: 07/04/2010 Document Revised: 08/24/2011 Document Reviewed: 07/04/2010 [facility] Patient Information 2014 [place].

## 2013-11-14 NOTE — Interval H&P Note (Signed)
Cath Lab Visit (complete for each Cath Lab visit)  Clinical Evaluation Leading to the Procedure:   ACS: no  Non-ACS:    Anginal Classification: CCS III  Anti-ischemic medical therapy: Minimal Therapy (1 class of medications)  Non-Invasive Test Results: Low-risk stress test findings: cardiac mortality <1%/year  Prior CABG: No previous CABG      History and Physical Interval Note:  11/14/2013 10:25 AM  [name]  has presented today for surgery, with the diagnosis of CP  The various methods of treatment have been discussed with the patient and family. After consideration of risks, benefits and other options for treatment, the patient has consented to  Procedure(s): LEFT AND RIGHT HEART CATHETERIZATION WITH CORONARY ANGIOGRAM (N/A) as a surgical intervention .  The patient's history has been reviewed, patient examined, no change in status, stable for surgery.  I have reviewed the patient's chart and labs.  Questions were answered to the patient's satisfaction.     Bayle Calvo [place]

## 2013-11-14 NOTE — CV Procedure (Signed)
    Cardiac Catheterization Procedure Note  Name: [name] MRN: [id number] DOB: 03-09-1971  Procedure: Right Heart Cath, Left Heart Cath, Selective Coronary Angiography, LV angiography  Indication: Cardiomyopathy of uncertain etiology   Procedural Details: The brachial, radial, and groin areas were prepped, draped, and anesthetized with 1% lidocaine. Initially, I tried to use a right brachial peripheral IV to place a brachial venous sheath.  However, I was unable to successfully pass into the vein with the 5 French sheath.  Therefore, using the modified Seldinger technique a 7 French sheath was placed in the right femoral vein. A Swan-Ganz catheter was used for the right heart catheterization. Standard protocol was followed for recording of right heart pressures and sampling of oxygen saturations. Fick cardiac output was calculated. Next, the right radial artery was entered using modified Seldinger technique and a 5 French sheath was placed.  The patient received 3 mg intra-arterial verapamil and weight-based IV heparin.  She received 200 mcg intra-arterial NTG at the end of the case due to spasm around the sheath.  Standard Judkins catheters were used for selective coronary angiography and left ventriculography. There were no immediate procedural complications. The patient was transferred to the post catheterization recovery area for further monitoring.  Procedural Findings: Hemodynamics (mmHg) RA mean 11 RV 46/13 PA 41/16, mean 27 PCWP mean 15 LV 125/13 AO 125/73  Oxygen saturations: PA 69% AO 97%  Cardiac Output (Fick) 6.72  Cardiac Index (Fick) 3.36 PVR 1.8 WU   Coronary angiography: Coronary dominance: right  Left mainstem: No angiographic CAD  Left anterior descending (LAD): No angiographic CAD  Left circumflex (LCx): No angiographic CAD  Right coronary artery (RCA): No angiographic CAD  Left ventriculography: Left ventricle was moderately hypokinetic diffusely,  LVEF is estimated at 35%, there is no significant mitral regurgitation.    Final Conclusions: Normal LV filling pressure with mildly elevated RV filling pressure, possibly suggesting RV failure out of proportion to LV failure.  EF about 35% with diffuse hypokinesis.  No angiographic CAD, nonischemic cardiomyopathy.   Recommendations: Medical management for cardiomyopathy, per Dr. Delton See.    Laurey Morale 11/14/2013, 11:22 AM

## 2013-11-17 ENCOUNTER — Other Ambulatory Visit: Payer: Self-pay

## 2013-11-20 ENCOUNTER — Telehealth: Payer: Self-pay | Admitting: Internal Medicine

## 2013-11-20 NOTE — Telephone Encounter (Signed)
s.w.l pt and advised on 6.9.15 appt...pt ok and aware

## 2013-11-21 ENCOUNTER — Other Ambulatory Visit (HOSPITAL_BASED_OUTPATIENT_CLINIC_OR_DEPARTMENT_OTHER): Payer: Medicare HMO

## 2013-11-21 DIAGNOSIS — D500 Iron deficiency anemia secondary to blood loss (chronic): Secondary | ICD-10-CM

## 2013-11-21 DIAGNOSIS — Z9884 Bariatric surgery status: Secondary | ICD-10-CM

## 2013-11-21 DIAGNOSIS — Z862 Personal history of diseases of the blood and blood-forming organs and certain disorders involving the immune mechanism: Secondary | ICD-10-CM

## 2013-11-21 LAB — CBC WITH DIFFERENTIAL/PLATELET
BASO%: 0.6 % (ref 0.0–2.0)
BASOS ABS: 0 10*3/uL (ref 0.0–0.1)
EOS ABS: 0.4 10*3/uL (ref 0.0–0.5)
EOS%: 5.4 % (ref 0.0–7.0)
HCT: 37 % (ref 34.8–46.6)
HEMOGLOBIN: 11.5 g/dL — AB (ref 11.6–15.9)
LYMPH%: 23.8 % (ref 14.0–49.7)
MCH: 24.1 pg — ABNORMAL LOW (ref 25.1–34.0)
MCHC: 31.1 g/dL — ABNORMAL LOW (ref 31.5–36.0)
MCV: 77.6 fL — ABNORMAL LOW (ref 79.5–101.0)
MONO#: 0.5 10*3/uL (ref 0.1–0.9)
MONO%: 5.6 % (ref 0.0–14.0)
NEUT%: 64.6 % (ref 38.4–76.8)
NEUTROS ABS: 5.4 10*3/uL (ref 1.5–6.5)
PLATELETS: 291 10*3/uL (ref 145–400)
RBC: 4.77 10*6/uL (ref 3.70–5.45)
RDW: 31.4 % — ABNORMAL HIGH (ref 11.2–14.5)
WBC: 8.3 10*3/uL (ref 3.9–10.3)
lymph#: 2 10*3/uL (ref 0.9–3.3)

## 2013-11-21 LAB — IRON AND TIBC CHCC
%SAT: 19 % — AB (ref 21–57)
Iron: 60 ug/dL (ref 41–142)
TIBC: 321 ug/dL (ref 236–444)
UIBC: 261 ug/dL (ref 120–384)

## 2013-11-21 LAB — FERRITIN CHCC: Ferritin: 119 ng/ml (ref 9–269)

## 2013-11-22 ENCOUNTER — Telehealth: Payer: Self-pay | Admitting: *Deleted

## 2013-11-22 NOTE — Telephone Encounter (Signed)
Pt contacted about holter monitor results showing PACs frequently but has no arrythmias, per Dr Delton See.  Advised pt of her F/U OV with Dr Delton See on 6/19 at 9:45am.  Pt verbalized understanding and pleased with the call back.

## 2013-11-24 ENCOUNTER — Other Ambulatory Visit: Payer: Self-pay | Admitting: Family Medicine

## 2013-11-24 DIAGNOSIS — N63 Unspecified lump in unspecified breast: Secondary | ICD-10-CM

## 2013-11-26 ENCOUNTER — Observation Stay (HOSPITAL_COMMUNITY)
Admission: EM | Admit: 2013-11-26 | Discharge: 2013-11-27 | Disposition: A | Discharge status: Home / Self Care | Payer: Medicare HMO | Attending: General Surgery | Admitting: General Surgery

## 2013-11-26 ENCOUNTER — Inpatient Hospital Stay: Admit: 2013-11-26 | Payer: Commercial Managed Care - HMO | Admitting: Surgery

## 2013-11-26 ENCOUNTER — Observation Stay (HOSPITAL_COMMUNITY): Payer: Medicare HMO | Admitting: Anesthesiology

## 2013-11-26 ENCOUNTER — Encounter (HOSPITAL_COMMUNITY): Payer: Self-pay | Admitting: Emergency Medicine

## 2013-11-26 ENCOUNTER — Encounter (HOSPITAL_COMMUNITY): Payer: Medicare HMO | Admitting: Anesthesiology

## 2013-11-26 ENCOUNTER — Emergency Department (HOSPITAL_COMMUNITY): Payer: Medicare HMO

## 2013-11-26 ENCOUNTER — Encounter (HOSPITAL_COMMUNITY)
Admission: EM | Disposition: A | Discharge status: Home / Self Care | Payer: Self-pay | Source: Home / Self Care | Attending: Emergency Medicine

## 2013-11-26 DIAGNOSIS — I10 Essential (primary) hypertension: Secondary | ICD-10-CM | POA: Insufficient documentation

## 2013-11-26 DIAGNOSIS — N61 Mastitis without abscess: Principal | ICD-10-CM | POA: Insufficient documentation

## 2013-11-26 DIAGNOSIS — E785 Hyperlipidemia, unspecified: Secondary | ICD-10-CM | POA: Insufficient documentation

## 2013-11-26 DIAGNOSIS — K219 Gastro-esophageal reflux disease without esophagitis: Secondary | ICD-10-CM | POA: Diagnosis not present

## 2013-11-26 DIAGNOSIS — F329 Major depressive disorder, single episode, unspecified: Secondary | ICD-10-CM | POA: Insufficient documentation

## 2013-11-26 DIAGNOSIS — N611 Abscess of the breast and nipple: Secondary | ICD-10-CM | POA: Diagnosis present

## 2013-11-26 DIAGNOSIS — I509 Heart failure, unspecified: Secondary | ICD-10-CM | POA: Insufficient documentation

## 2013-11-26 DIAGNOSIS — Z7982 Long term (current) use of aspirin: Secondary | ICD-10-CM | POA: Insufficient documentation

## 2013-11-26 DIAGNOSIS — J45909 Unspecified asthma, uncomplicated: Secondary | ICD-10-CM | POA: Insufficient documentation

## 2013-11-26 DIAGNOSIS — F3289 Other specified depressive episodes: Secondary | ICD-10-CM | POA: Insufficient documentation

## 2013-11-26 DIAGNOSIS — Z794 Long term (current) use of insulin: Secondary | ICD-10-CM | POA: Insufficient documentation

## 2013-11-26 DIAGNOSIS — IMO0001 Reserved for inherently not codable concepts without codable children: Secondary | ICD-10-CM | POA: Insufficient documentation

## 2013-11-26 DIAGNOSIS — E119 Type 2 diabetes mellitus without complications: Secondary | ICD-10-CM | POA: Insufficient documentation

## 2013-11-26 DIAGNOSIS — D649 Anemia, unspecified: Secondary | ICD-10-CM | POA: Insufficient documentation

## 2013-11-26 DIAGNOSIS — F411 Generalized anxiety disorder: Secondary | ICD-10-CM | POA: Insufficient documentation

## 2013-11-26 DIAGNOSIS — Z79899 Other long term (current) drug therapy: Secondary | ICD-10-CM | POA: Insufficient documentation

## 2013-11-26 HISTORY — PX: IRRIGATION AND DEBRIDEMENT ABSCESS: SHX5252

## 2013-11-26 LAB — BASIC METABOLIC PANEL
BUN: 27 mg/dL — AB (ref 6–23)
CHLORIDE: 101 meq/L (ref 96–112)
CO2: 27 mEq/L (ref 19–32)
Calcium: 9.3 mg/dL (ref 8.4–10.5)
Creatinine, Ser: 1.12 mg/dL — ABNORMAL HIGH (ref 0.50–1.10)
GFR, EST AFRICAN AMERICAN: 69 mL/min — AB (ref >90)
GFR, EST NON AFRICAN AMERICAN: 60 mL/min — AB (ref >90)
Glucose, Bld: 73 mg/dL (ref 70–99)
POTASSIUM: 4.5 meq/L (ref 3.7–5.3)
Sodium: 140 mEq/L (ref 137–147)

## 2013-11-26 LAB — CBC WITH DIFFERENTIAL/PLATELET
BASOS ABS: 0 10*3/uL (ref 0.0–0.1)
BASOS PCT: 0 % (ref 0–1)
Eosinophils Absolute: 0.5 10*3/uL (ref 0.0–0.7)
Eosinophils Relative: 6 % — ABNORMAL HIGH (ref 0–5)
HEMATOCRIT: 35 % — AB (ref 36.0–46.0)
HEMOGLOBIN: 11.4 g/dL — AB (ref 12.0–15.0)
Lymphocytes Relative: 28 % (ref 12–46)
Lymphs Abs: 2.3 10*3/uL (ref 0.7–4.0)
MCH: 24.7 pg — ABNORMAL LOW (ref 26.0–34.0)
MCHC: 32.6 g/dL (ref 30.0–36.0)
MCV: 75.9 fL — ABNORMAL LOW (ref 78.0–100.0)
MONOS PCT: 6 % (ref 3–12)
Monocytes Absolute: 0.5 10*3/uL (ref 0.1–1.0)
NEUTROS ABS: 4.9 10*3/uL (ref 1.7–7.7)
NEUTROS PCT: 60 % (ref 43–77)
Platelets: 302 10*3/uL (ref 150–400)
RBC: 4.61 MIL/uL (ref 3.87–5.11)
WBC: 8.2 10*3/uL (ref 4.0–10.5)

## 2013-11-26 LAB — GLUCOSE, CAPILLARY
GLUCOSE-CAPILLARY: 48 mg/dL — AB (ref 70–99)
Glucose-Capillary: 53 mg/dL — ABNORMAL LOW (ref 70–99)
Glucose-Capillary: 99 mg/dL (ref 70–99)
Glucose-Capillary: 123 mg/dL — ABNORMAL HIGH (ref 70–99)

## 2013-11-26 LAB — CBG MONITORING, ED: Glucose-Capillary: 74 mg/dL (ref 70–99)

## 2013-11-26 SURGERY — IRRIGATION AND DEBRIDEMENT ABSCESS
Anesthesia: General | Site: Breast | Laterality: Left

## 2013-11-26 MED ORDER — IRBESARTAN 75 MG PO TABS
75.0000 mg | ORAL_TABLET | Freq: Every day | ORAL | Status: DC
  Administered 2013-11-27: 75 mg via ORAL
  Filled 2013-11-26: qty 1

## 2013-11-26 MED ORDER — OXYCODONE-ACETAMINOPHEN 5-325 MG PO TABS
1.0000 | ORAL_TABLET | ORAL | Status: DC | PRN
  Administered 2013-11-27: 1 via ORAL
  Filled 2013-11-26: qty 1

## 2013-11-26 MED ORDER — SODIUM CHLORIDE 0.9 % IV SOLN: 3.0000 g | Freq: Four times a day (QID) | INTRAVENOUS | Status: DC

## 2013-11-26 MED ORDER — KETOROLAC TROMETHAMINE 30 MG/ML IJ SOLN: 15.0000 mg | Freq: Once | INTRAMUSCULAR | Status: DC | PRN

## 2013-11-26 MED ORDER — MIDAZOLAM HCL 2 MG/2ML IJ SOLN
INTRAMUSCULAR | Status: AC
  Filled 2013-11-26: qty 2

## 2013-11-26 MED ORDER — 0.9 % SODIUM CHLORIDE (POUR BTL) OPTIME
TOPICAL | Status: DC | PRN
  Administered 2013-11-26: 1000 mL

## 2013-11-26 MED ORDER — DEXTROSE 50 % IV SOLN
INTRAVENOUS | Status: AC
  Filled 2013-11-26: qty 50

## 2013-11-26 MED ORDER — PROMETHAZINE HCL 25 MG/ML IJ SOLN: 6.2500 mg | INTRAMUSCULAR | Status: DC | PRN

## 2013-11-26 MED ORDER — INSULIN GLARGINE 100 UNIT/ML ~~LOC~~ SOLN
74.0000 [IU] | Freq: Every day | SUBCUTANEOUS | Status: DC
  Administered 2013-11-27: 74 [IU] via SUBCUTANEOUS
  Filled 2013-11-26: qty 0.74

## 2013-11-26 MED ORDER — HYDROMORPHONE HCL PF 1 MG/ML IJ SOLN
1.0000 mg | INTRAMUSCULAR | Status: DC | PRN
  Administered 2013-11-26 – 2013-11-27 (×2): 1 mg via INTRAVENOUS
  Filled 2013-11-26 (×2): qty 1

## 2013-11-26 MED ORDER — POTASSIUM CHLORIDE IN NACL 20-0.45 MEQ/L-% IV SOLN
INTRAVENOUS | Status: DC
  Administered 2013-11-27: 01:00:00 via INTRAVENOUS
  Filled 2013-11-26 (×3): qty 1000

## 2013-11-26 MED ORDER — PHENYLEPHRINE HCL 10 MG/ML IJ SOLN
INTRAMUSCULAR | Status: DC | PRN
  Administered 2013-11-26: 80 ug via INTRAVENOUS

## 2013-11-26 MED ORDER — FENTANYL CITRATE 0.05 MG/ML IJ SOLN
INTRAMUSCULAR | Status: DC | PRN
  Administered 2013-11-26 (×2): 25 ug via INTRAVENOUS
  Administered 2013-11-26: 50 ug via INTRAVENOUS

## 2013-11-26 MED ORDER — EPHEDRINE SULFATE 50 MG/ML IJ SOLN
INTRAMUSCULAR | Status: DC | PRN
  Administered 2013-11-26 (×2): 10 mg via INTRAVENOUS

## 2013-11-26 MED ORDER — DEXAMETHASONE SODIUM PHOSPHATE 10 MG/ML IJ SOLN
INTRAMUSCULAR | Status: DC | PRN
  Administered 2013-11-26: 10 mg via INTRAVENOUS

## 2013-11-26 MED ORDER — HYDROMORPHONE HCL PF 1 MG/ML IJ SOLN: 0.2500 mg | INTRAMUSCULAR | Status: DC | PRN

## 2013-11-26 MED ORDER — PHENYLEPHRINE 40 MCG/ML (10ML) SYRINGE FOR IV PUSH (FOR BLOOD PRESSURE SUPPORT)
PREFILLED_SYRINGE | INTRAVENOUS | Status: AC
  Filled 2013-11-26: qty 10

## 2013-11-26 MED ORDER — LIDOCAINE HCL (CARDIAC) 20 MG/ML IV SOLN
INTRAVENOUS | Status: AC
  Filled 2013-11-26: qty 5

## 2013-11-26 MED ORDER — ALBUTEROL SULFATE HFA 108 (90 BASE) MCG/ACT IN AERS: 2.0000 | INHALATION_SPRAY | Freq: Four times a day (QID) | RESPIRATORY_TRACT | Status: DC | PRN

## 2013-11-26 MED ORDER — FLUTICASONE PROPIONATE 50 MCG/ACT NA SUSP
50.0000 | Freq: Every day | NASAL | Status: DC
  Administered 2013-11-27: 50 via NASAL
  Filled 2013-11-26: qty 16

## 2013-11-26 MED ORDER — ONDANSETRON HCL 4 MG/2ML IJ SOLN
INTRAMUSCULAR | Status: AC
  Filled 2013-11-26: qty 2

## 2013-11-26 MED ORDER — ONDANSETRON HCL 4 MG/2ML IJ SOLN
4.0000 mg | Freq: Four times a day (QID) | INTRAMUSCULAR | Status: DC | PRN
  Administered 2013-11-26: 4 mg via INTRAVENOUS
  Filled 2013-11-26: qty 2

## 2013-11-26 MED ORDER — FENTANYL CITRATE 0.05 MG/ML IJ SOLN
INTRAMUSCULAR | Status: AC
  Filled 2013-11-26: qty 2

## 2013-11-26 MED ORDER — NEBIVOLOL HCL 2.5 MG PO TABS
2.5000 mg | ORAL_TABLET | Freq: Every day | ORAL | Status: DC
  Administered 2013-11-27: 2.5 mg via ORAL
  Filled 2013-11-26: qty 1

## 2013-11-26 MED ORDER — SODIUM CHLORIDE 0.9 % IV SOLN
INTRAVENOUS | Status: DC | PRN
  Administered 2013-11-26: 19:00:00 via INTRAVENOUS

## 2013-11-26 MED ORDER — FUROSEMIDE 40 MG PO TABS
40.0000 mg | ORAL_TABLET | Freq: Two times a day (BID) | ORAL | Status: DC
  Administered 2013-11-27: 40 mg via ORAL
  Filled 2013-11-26 (×3): qty 1

## 2013-11-26 MED ORDER — PREGABALIN 75 MG PO CAPS
150.0000 mg | ORAL_CAPSULE | Freq: Three times a day (TID) | ORAL | Status: DC
  Administered 2013-11-27 (×2): 150 mg via ORAL
  Filled 2013-11-26 (×2): qty 2

## 2013-11-26 MED ORDER — LUBIPROSTONE 24 MCG PO CAPS
24.0000 ug | ORAL_CAPSULE | Freq: Two times a day (BID) | ORAL | Status: DC
  Administered 2013-11-27: 24 ug via ORAL
  Filled 2013-11-26 (×3): qty 1

## 2013-11-26 MED ORDER — OXYCODONE-ACETAMINOPHEN 10-325 MG PO TABS: 1.0000 | ORAL_TABLET | ORAL | Status: DC | PRN

## 2013-11-26 MED ORDER — POTASSIUM CHLORIDE IN NACL 20-0.45 MEQ/L-% IV SOLN
INTRAVENOUS | Status: DC
Start: 2013-11-26 — End: 2013-11-27
  Administered 2013-11-26: 19:00:00 via INTRAVENOUS
  Filled 2013-11-26 (×3): qty 1000

## 2013-11-26 MED ORDER — ACETAMINOPHEN 325 MG PO TABS: 650.0000 mg | ORAL_TABLET | Freq: Four times a day (QID) | ORAL | Status: DC | PRN

## 2013-11-26 MED ORDER — ONDANSETRON HCL 4 MG PO TABS: 4.0000 mg | ORAL_TABLET | Freq: Four times a day (QID) | ORAL | Status: DC | PRN

## 2013-11-26 MED ORDER — SODIUM CHLORIDE 0.9 % IV SOLN
3.0000 g | Freq: Four times a day (QID) | INTRAVENOUS | Status: DC
  Administered 2013-11-26 – 2013-11-27 (×3): 3 g via INTRAVENOUS
  Filled 2013-11-26 (×5): qty 3

## 2013-11-26 MED ORDER — DEXAMETHASONE SODIUM PHOSPHATE 10 MG/ML IJ SOLN
INTRAMUSCULAR | Status: AC
  Filled 2013-11-26: qty 1

## 2013-11-26 MED ORDER — HYDROCODONE-ACETAMINOPHEN 5-325 MG PO TABS: 1.0000 | ORAL_TABLET | ORAL | Status: DC | PRN

## 2013-11-26 MED ORDER — MEPERIDINE HCL 50 MG/ML IJ SOLN: 6.2500 mg | INTRAMUSCULAR | Status: DC | PRN

## 2013-11-26 MED ORDER — LACTATED RINGERS IV SOLN
INTRAVENOUS | Status: DC
  Administered 2013-11-26: 22:00:00 via INTRAVENOUS

## 2013-11-26 MED ORDER — PROPOFOL 10 MG/ML IV BOLUS
INTRAVENOUS | Status: DC | PRN
Start: 2013-11-26 — End: 2013-11-26
  Administered 2013-11-26: 150 mg via INTRAVENOUS

## 2013-11-26 MED ORDER — POTASSIUM CHLORIDE CRYS ER 20 MEQ PO TBCR
20.0000 meq | EXTENDED_RELEASE_TABLET | Freq: Every day | ORAL | Status: DC
  Administered 2013-11-27: 20 meq via ORAL
  Filled 2013-11-26: qty 1

## 2013-11-26 MED ORDER — ALBUTEROL SULFATE (2.5 MG/3ML) 0.083% IN NEBU: 2.5000 mg | INHALATION_SOLUTION | Freq: Four times a day (QID) | RESPIRATORY_TRACT | Status: DC | PRN

## 2013-11-26 MED ORDER — ONDANSETRON HCL 4 MG/2ML IJ SOLN
INTRAMUSCULAR | Status: DC | PRN
  Administered 2013-11-26: 4 mg via INTRAVENOUS

## 2013-11-26 MED ORDER — DEXTROSE 50 % IV SOLN
INTRAVENOUS | Status: DC | PRN
  Administered 2013-11-26: 25 mL via INTRAVENOUS

## 2013-11-26 MED ORDER — OXYCODONE HCL 5 MG PO TABS
5.0000 mg | ORAL_TABLET | ORAL | Status: DC | PRN
  Administered 2013-11-27: 5 mg via ORAL
  Administered 2013-11-27: 10 mg via ORAL
  Filled 2013-11-26: qty 1
  Filled 2013-11-26: qty 2

## 2013-11-26 MED ORDER — ONDANSETRON HCL 4 MG/2ML IJ SOLN: 4.0000 mg | Freq: Four times a day (QID) | INTRAMUSCULAR | Status: DC | PRN

## 2013-11-26 MED ORDER — ACETAMINOPHEN 650 MG RE SUPP: 650.0000 mg | Freq: Four times a day (QID) | RECTAL | Status: DC | PRN

## 2013-11-26 MED ORDER — KETOROLAC TROMETHAMINE 30 MG/ML IJ SOLN
INTRAMUSCULAR | Status: AC
  Filled 2013-11-26: qty 1

## 2013-11-26 MED ORDER — METOLAZONE 2.5 MG PO TABS
2.5000 mg | ORAL_TABLET | Freq: Every day | ORAL | Status: DC
  Administered 2013-11-27: 2.5 mg via ORAL
  Filled 2013-11-26: qty 1

## 2013-11-26 MED ORDER — PROPOFOL 10 MG/ML IV BOLUS
INTRAVENOUS | Status: AC
  Filled 2013-11-26: qty 20

## 2013-11-26 MED ORDER — CITALOPRAM HYDROBROMIDE 20 MG PO TABS
20.0000 mg | ORAL_TABLET | Freq: Every day | ORAL | Status: DC
  Administered 2013-11-27: 20 mg via ORAL
  Filled 2013-11-26: qty 1

## 2013-11-26 MED ORDER — BUPROPION HCL ER (XL) 300 MG PO TB24
300.0000 mg | ORAL_TABLET | Freq: Every day | ORAL | Status: DC
  Administered 2013-11-27: 300 mg via ORAL
  Filled 2013-11-26 (×2): qty 1

## 2013-11-26 MED ORDER — LIDOCAINE HCL (CARDIAC) 20 MG/ML IV SOLN
INTRAVENOUS | Status: DC | PRN
  Administered 2013-11-26: 100 mg via INTRAVENOUS

## 2013-11-26 SURGICAL SUPPLY — 35 items
BENZOIN TINCTURE PRP APPL 2/3 (GAUZE/BANDAGES/DRESSINGS) IMPLANT
BLADE HEX COATED 2.75 (ELECTRODE) IMPLANT
BLADE SURG SZ10 CARB STEEL (BLADE) IMPLANT
CANISTER SUCTION 2500CC (MISCELLANEOUS) IMPLANT
CLOSURE WOUND 1/2 X4 (GAUZE/BANDAGES/DRESSINGS)
DECANTER SPIKE VIAL GLASS SM (MISCELLANEOUS) IMPLANT
DRAIN CHANNEL RND F F (WOUND CARE) IMPLANT
DRAPE LAPAROTOMY T 102X78X121 (DRAPES) IMPLANT
DRAPE LAPAROTOMY TRNSV 102X78 (DRAPE) ×3 IMPLANT
DRAPE LG THREE QUARTER DISP (DRAPES) IMPLANT
ELECT REM PT RETURN 9FT ADLT (ELECTROSURGICAL) ×3
ELECTRODE REM PT RTRN 9FT ADLT (ELECTROSURGICAL) ×1 IMPLANT
EVACUATOR SILICONE 100CC (DRAIN) IMPLANT
GAUZE PACKING IODOFORM 1 (PACKING) ×3 IMPLANT
GAUZE SPONGE 4X4 12PLY STRL (GAUZE/BANDAGES/DRESSINGS) ×2 IMPLANT
GLOVE BIOGEL PI IND STRL 7.0 (GLOVE) ×1 IMPLANT
GLOVE BIOGEL PI INDICATOR 7.0 (GLOVE) ×2
GLOVE SURG ORTHO 8.0 STRL STRW (GLOVE) ×3 IMPLANT
GOWN STRL REUS W/TWL LRG LVL3 (GOWN DISPOSABLE) IMPLANT
GOWN STRL REUS W/TWL XL LVL3 (GOWN DISPOSABLE) ×3 IMPLANT
KIT BASIN OR (CUSTOM PROCEDURE TRAY) ×3 IMPLANT
MARKER SKIN DUAL TIP RULER LAB (MISCELLANEOUS) IMPLANT
NEEDLE HYPO 25X1 1.5 SAFETY (NEEDLE) IMPLANT
NS IRRIG 1000ML POUR BTL (IV SOLUTION) ×3 IMPLANT
PACK GENERAL/GYN (CUSTOM PROCEDURE TRAY) ×3 IMPLANT
SPONGE GAUZE 4X4 12PLY (GAUZE/BANDAGES/DRESSINGS) ×3 IMPLANT
SPONGE LAP 18X18 X RAY DECT (DISPOSABLE) IMPLANT
STAPLER VISISTAT 35W (STAPLE) IMPLANT
STRIP CLOSURE SKIN 1/2X4 (GAUZE/BANDAGES/DRESSINGS) IMPLANT
SUT ETHILON 3 0 PS 1 (SUTURE) IMPLANT
SUT MNCRL AB 4-0 PS2 18 (SUTURE) IMPLANT
SUT VIC AB 3-0 SH 18 (SUTURE) IMPLANT
SYR CONTROL 10ML LL (SYRINGE) IMPLANT
TAPE CLOTH SURG 4X10 WHT LF (GAUZE/BANDAGES/DRESSINGS) ×3 IMPLANT
TOWEL OR 17X26 10 PK STRL BLUE (TOWEL DISPOSABLE) ×3 IMPLANT

## 2013-11-26 NOTE — ED Provider Notes (Signed)
CSN: [id number]     Arrival date & time 11/26/13  1517 History   First MD Initiated Contact with Patient 11/26/13 1544     No chief complaint on file.    (Consider location/radiation/quality/duration/timing/severity/associated sxs/prior Treatment) HPI Comments: Patient with a history of DM  presents today with an abscess of her left breast.  She reports that the abscess has been present for the past week and has grown in size significantly.  She has not noticed any drainage from the area.  She reports that the area is very tender to palpation.  She reports that she saw her PCP for this in the past week, but it was much smaller at that time.  Her PCP scheduled her for a Mammogram, which she has not yet had performed.  She was not placed on any antibiotics.  She denies fever, chills, nausea, or vomiting.  She reports that she has had a prior abscess of her left breast that had been incised and drained previously.  She denies any nipple discharge.    The history is provided by the patient.    Past Medical History  Diagnosis Date  . Arthritis   . Asthma   . GERD (gastroesophageal reflux disease)   . Diabetes mellitus   . Hyperlipidemia   . Migraines   . Hypertension   . Back pain, chronic   . Depression   . Neuropathy   . Anxiety   . Nervous breakdown sept 2013  . Fibromyalgia   . History of gastric bypass Jan 2013  . Insomnia   . Anemia   . CHF (congestive heart failure)    Past Surgical History  Procedure Laterality Date  . Back surgery  November 1992  . Cesarean section  December 1998  . Tubal ligation  January 1999  . Cholecystectomy  1998  . Gastric roux-en-y  06/30/2011    Procedure: LAPAROSCOPIC ROUX-EN-Y GASTRIC;  Surgeon: Mariella Saa, MD;  Location: [facility];  Service: General;  Laterality: N/A;     . Esophagogastroduodenoscopy  07/27/2011    Procedure: ESOPHAGOGASTRODUODENOSCOPY (EGD);  Surgeon: Freddy Jaksch, MD;  Location: [facility];  Service: Endoscopy;   Laterality: N/A;   Family History  Problem Relation Age of Onset  . Diabetes Maternal Aunt   . Diabetes Paternal Aunt   . Diabetes Paternal Grandfather    History  Substance Use Topics  . Smoking status: Never Smoker   . Smokeless tobacco: Never Used  . Alcohol Use: No   OB History   Grav Para Term Preterm Abortions TAB SAB Ect Mult Living                 Review of Systems  All other systems reviewed and are negative.     Allergies  Imitrex  Home Medications   Prior to Admission medications   Medication Sig Start Date End Date Taking? Authorizing Provider  albuterol (PROVENTIL HFA;VENTOLIN HFA) 108 (90 BASE) MCG/ACT inhaler Inhale 2 puffs into the lungs every 6 (six) hours as needed. WHEEZING    Yes Historical Provider, MD  aspirin EC 81 MG tablet Take 1 tablet (81 mg total) by mouth daily. 11/02/13  Yes Lars Masson, MD  Biotin 1000 MCG tablet Take 1,000 mcg by mouth daily.   Yes Historical Provider, MD  buPROPion (WELLBUTRIN XL) 300 MG 24 hr tablet Take 300 mg by mouth daily. 02/23/13  Yes Historical Provider, MD  citalopram (CELEXA) 20 MG tablet Take 1 tablet (20 mg total)  by mouth daily. For depression. 02/19/12 06/04/14 Yes Mike Craze, MD  cyclobenzaprine (FLEXERIL) 10 MG tablet Take 10 mg by mouth 3 (three) times daily as needed for muscle spasms.  11/23/13  Yes Historical Provider, MD  fluticasone (FLONASE) 50 MCG/ACT nasal spray Place 50 sprays into both nostrils daily.  03/07/13  Yes Historical Provider, MD  furosemide (LASIX) 40 MG tablet Take 40 mg by mouth 2 (two) times daily.   Yes Historical Provider, MD  insulin glargine (LANTUS) 100 UNIT/ML injection Inject 74 Units into the skin daily. For control of blood sugar 02/19/12  Yes Mike Craze, MD  lidocaine (LIDODERM) 5 % Place 1 patch onto the skin daily as needed (for back pain).  03/21/13  Yes Historical Provider, MD  lubiprostone (AMITIZA) 24 MCG capsule Take 1 capsule (24 mcg total) by mouth 2 (two)  times daily with a meal. 03/17/12  Yes Kathlen Mody, MD  methocarbamol (ROBAXIN) 750 MG tablet Take 750 mg by mouth every 6 (six) hours as needed for muscle spasms.  10/29/13  Yes Historical Provider, MD  metolazone (ZAROXOLYN) 2.5 MG tablet Take 1 tablet (2.5 mg total) by mouth daily. 10/13/13  Yes Lars Masson, MD  nebivolol (BYSTOLIC) 2.5 MG tablet Take 1 tablet (2.5 mg total) by mouth daily. 10/13/13  Yes Lars Masson, MD  olmesartan (BENICAR) 20 MG tablet Take 1 tablet (20 mg total) by mouth daily. 10/13/13  Yes Lars Masson, MD  oxyCODONE-acetaminophen (PERCOCET) 10-325 MG per tablet Take 0.5-1 tablets by mouth every 4 (four) hours as needed for pain.    Yes Historical Provider, MD  potassium chloride SA (KLOR-CON M20) 20 MEQ tablet Take 1 tablet (20 mEq total) by mouth daily. 10/13/13  Yes Lars Masson, MD  pregabalin (LYRICA) 150 MG capsule Take 150 mg by mouth 3 (three) times daily.   Yes Historical Provider, MD  ranitidine (ZANTAC) 150 MG tablet Take 150 mg by mouth daily. 02/11/13  Yes Historical Provider, MD  tiZANidine (ZANAFLEX) 4 MG tablet Take 4 mg by mouth every 6 (six) hours as needed (for pain). 04/04/13  Yes Levert Feinstein, MD  VOLTAREN 1 % GEL Apply 2 g topically 4 (four) times daily.  03/18/13  Yes Historical Provider, MD   BP 106/81  Pulse 85  Temp(Src) 98.5 F (36.9 C) (Oral)  Resp 16  SpO2 100% Physical Exam  Nursing note and vitals reviewed. Constitutional: She appears well-developed and well-nourished.  HENT:  Head: Normocephalic and atraumatic.  Mouth/Throat: Oropharynx is clear and moist.  Neck: Normal range of motion. Neck supple.  Cardiovascular: Normal rate, regular rhythm and normal heart sounds.   Pulmonary/Chest: Effort normal and breath sounds normal.  Patient with a 4cm x 4.5 cm abscess of the left breast at the 5:00 position.  Area is ulcerated.  Area is tender and fluctuant.  Surrounding erythema and induration.  No nipple discharge.   Neurological: She is alert.  Skin: Skin is warm and dry.  Psychiatric: She has a normal mood and affect.    ED Course  Procedures (including critical care time) Labs Review Labs Reviewed  CBC WITH DIFFERENTIAL  BASIC METABOLIC PANEL  CBG MONITORING, ED    Imaging Review US Breast Ltd Uni Left Inc Axilla  11/26/2013   CLINICAL DATA:  Left breast abscess.  EXAM: ULTRASOUND OF THE left BREAST  COMPARISON:  None.  FINDINGS: Ultrasound is performed, showing no abscess. Mass like area is present in the left breast at the  5 o'clock position 2 cm from the nipple. Hypervascularity was present.  IMPRESSION: Negative for abscess. Clinical followup and dedicated breast ultrasound at the breast center recommended.   Electronically Signed   By: Andreas NewportGeoffrey  Lamke M.D.   On: 11/26/2013 17:53     EKG Interpretation None     Patient discussed with Dr. Gerrit FriendsGerkin with General Surgery who has agreed to evaluate the patient in the ED.  He requests getting an Ultrasound of the breast.   MDM   Final diagnoses:  None   Patient with a history of DM presents today with an abscess of the left breast.  Patient is afebrile.  Dr. Gerrit FriendsGerkin with General Surgery consulted and agreed to admit the patient and take the patient to the OR.    Santiago GladHeather Samar Dass, PA-C 11/27/13 [phone]

## 2013-11-26 NOTE — Brief Op Note (Signed)
11/26/2013  8:17 PM  PATIENT:  [name]  43 y.o. female  PRE-OPERATIVE DIAGNOSIS:  left breast abscess  POST-OPERATIVE DIAGNOSIS:  left breast abscess  PROCEDURE:  Incision and drainage of left breast abscess, excision of involved skin and subcutaneous tissue  SURGEON:  Surgeon(s) and Role:    * Velora Heckler, MD - Primary  ANESTHESIA:   general  EBL:     BLOOD ADMINISTERED:none  DRAINS: none   LOCAL MEDICATIONS USED:  NONE  SPECIMEN:  Excision and cultures sent to lab  DISPOSITION OF SPECIMEN:  PATHOLOGY  COUNTS:  YES  TOURNIQUET:  * No tourniquets in log *  DICTATION: .Other Dictation: Dictation Number [id number]  PLAN OF CARE: Admit for overnight observation  PATIENT DISPOSITION:  PACU - hemodynamically stable.   Delay start of Pharmacological VTE agent (>24hrs) due to surgical blood loss or risk of bleeding: yes  Velora Heckler, MD, [facility] Office: [phone]

## 2013-11-26 NOTE — Addendum Note (Signed)
Addendum created 11/26/13 2056 by Orest Dikes, CRNA   Modules edited: Anesthesia Events, Anesthesia Medication Administration

## 2013-11-26 NOTE — H&P (Signed)
[name] is an 43 y.o. female.    General Surgery [facility]  Chief Complaint:  Left breast abscess  HPI: patient is a 43 year old female who presents to the emergency department with a one-week history of an enlarging mass and inflammatory process in the left lateral breast. She was initially evaluated one week ago by her primary care physician. The mass has continued to enlarge and has become painful. There has been a small amount of drainage. Patient presents to the emergency department today for evaluation.  Patient has no prior history of breast disease. She has had no prior breast abscesses.  Patient is diabetic on insulin. Patient has cardiomyopathy with recent cardiac catheterization showing an ejection fraction of 35%.  Past Medical History  Diagnosis Date  . Arthritis   . Asthma   . GERD (gastroesophageal reflux disease)   . Diabetes mellitus   . Hyperlipidemia   . Migraines   . Hypertension   . Back pain, chronic   . Depression   . Neuropathy   . Anxiety   . Nervous breakdown sept 2013  . Fibromyalgia   . History of gastric bypass Jan 2013  . Insomnia   . Anemia   . CHF (congestive heart failure)     Past Surgical History  Procedure Laterality Date  . Back surgery  November 1992  . Cesarean section  December 1998  . Tubal ligation  January 1999  . Cholecystectomy  1998  . Gastric roux-en-y  06/30/2011    Procedure: LAPAROSCOPIC ROUX-EN-Y GASTRIC;  Surgeon: Edward Jolly, MD;  Location: [facility];  Service: General;  Laterality: N/A;     . Esophagogastroduodenoscopy  07/27/2011    Procedure: ESOPHAGOGASTRODUODENOSCOPY (EGD);  Surgeon: Landry Dyke, MD;  Location: [facility];  Service: Endoscopy;  Laterality: N/A;    Family History  Problem Relation Age of Onset  . Diabetes Maternal Aunt   . Diabetes Paternal Aunt   . Diabetes Paternal Grandfather    Social History:  reports that she has never smoked. She has never used  smokeless tobacco. She reports that she does not drink alcohol or use illicit drugs.  Allergies:  Allergies  Allergen Reactions  . Imitrex [Sumatriptan] Nausea Only    Can not take the pill but she take the injection     (Not in a hospital admission)  Results for orders placed during the hospital encounter of 11/26/13 (from the past 48 hour(s))  CBG MONITORING, ED     Status: None   Collection Time    11/26/13  4:39 PM      Result Value Ref Range   Glucose-Capillary 74  70 - 99 mg/dL  CBC WITH DIFFERENTIAL     Status: Abnormal   Collection Time    11/26/13  4:51 PM      Result Value Ref Range   WBC 8.2  4.0 - 10.5 K/uL   RBC 4.61  3.87 - 5.11 MIL/uL   Hemoglobin 11.4 (*) 12.0 - 15.0 g/dL   HCT 35.0 (*) 36.0 - 46.0 %   MCV 75.9 (*) 78.0 - 100.0 fL   MCH 24.7 (*) 26.0 - 34.0 pg   MCHC 32.6  30.0 - 36.0 g/dL   RDW NOT CALCULATED  11.5 - 15.5 %   Platelets 302  150 - 400 K/uL   Neutrophils Relative % 60  43 - 77 %   Lymphocytes Relative 28  12 - 46 %   Monocytes Relative 6  3 - 12 %   Eosinophils Relative 6 (*) 0 - 5 %   Basophils Relative 0  0 - 1 %   Neutro Abs 4.9  1.7 - 7.7 K/uL   Lymphs Abs 2.3  0.7 - 4.0 K/uL   Monocytes Absolute 0.5  0.1 - 1.0 K/uL   Eosinophils Absolute 0.5  0.0 - 0.7 K/uL   Basophils Absolute 0.0  0.0 - 0.1 K/uL   Smear Review MORPHOLOGY UNREMARKABLE    BASIC METABOLIC PANEL     Status: Abnormal   Collection Time    11/26/13  4:51 PM      Result Value Ref Range   Sodium 140  137 - 147 mEq/L   Potassium 4.5  3.7 - 5.3 mEq/L   Comment: SLIGHT HEMOLYSIS     HEMOLYSIS AT THIS LEVEL MAY AFFECT RESULT   Chloride 101  96 - 112 mEq/L   CO2 27  19 - 32 mEq/L   Glucose, Bld 73  70 - 99 mg/dL   BUN 27 (*) 6 - 23 mg/dL   Creatinine, Ser 1.12 (*) 0.50 - 1.10 mg/dL   Calcium 9.3  8.4 - 10.5 mg/dL   GFR calc non Af Amer 60 (*) >90 mL/min   GFR calc Af Amer 69 (*) >90 mL/min   Comment: (NOTE)     The eGFR has been calculated using the CKD EPI equation.      This calculation has not been validated in all clinical situations.     eGFR's persistently <90 mL/min signify possible Chronic Kidney     Disease.   US Breast Ltd Uni Left Inc Axilla  11/26/2013   CLINICAL DATA:  Left breast abscess.  EXAM: ULTRASOUND OF THE left BREAST  COMPARISON:  None.  FINDINGS: Ultrasound is performed, showing no abscess. Mass like area is present in the left breast at the 5 o'clock position 2 cm from the nipple. Hypervascularity was present.  IMPRESSION: Negative for abscess. Clinical followup and dedicated breast ultrasound at the breast center recommended.   Electronically Signed   By: Dereck Ligas M.D.   On: 11/26/2013 17:53    Review of Systems  Constitutional: Negative for fever, chills and diaphoresis.  HENT: Negative.   Eyes: Negative.   Respiratory: Negative.   Cardiovascular: Negative.   Gastrointestinal: Negative.   Genitourinary: Negative.   Musculoskeletal: Negative.   Skin: Negative.   Neurological: Negative.   Endo/Heme/Allergies: Negative.   Psychiatric/Behavioral: Negative.     Blood pressure 114/81, pulse 83, temperature 98.5 F (36.9 C), temperature source Oral, resp. rate 16, SpO2 99.00%. Physical Exam  Nursing note and vitals reviewed. Constitutional: She is oriented to person, place, and time. She appears well-developed and well-nourished. No distress.  HENT:  Head: Normocephalic and atraumatic.  Right Ear: External ear normal.  Left Ear: External ear normal.  Eyes: Conjunctivae are normal. Pupils are equal, round, and reactive to light. No scleral icterus.  Neck: Normal range of motion. Neck supple. No tracheal deviation present. No thyromegaly present.  Cardiovascular: Normal rate, regular rhythm and normal heart sounds.   Respiratory: Effort normal and breath sounds normal. She has no wheezes.  BREAST:  Left breast shows ulcerated, indurated, inflammatory process with central necrosis in lower outer quadrant.  Very tender,  fluctuent.  GI: Soft. Bowel sounds are normal. She exhibits no distension. There is no tenderness.  Musculoskeletal: Normal range of motion. She exhibits no edema.  Neurological: She is alert and oriented to person, place, and time.  Skin: Skin is warm and dry.  Psychiatric: She has a normal mood and affect. Her behavior is normal.     Assessment/Plan Left Breast Abscess with necrosis of skin  Admit to general surgery for incision, drainage, and open packing of breast abscess  IV Unasyn to begin now  SSI for control of DM  The risks and benefits of the procedure have been discussed at length with the patient.  The patient understands the proposed procedure, potential alternative treatments, and the course of recovery to be expected.  All of the patient's questions have been answered at this time.  The patient wishes to proceed with surgery.  Earnstine Regal, MD, [facility] Office: [place] 11/26/2013, 5:57 PM

## 2013-11-26 NOTE — ED Notes (Addendum)
While tech was helping pt to remove pants to go to PACU, pt feet slid apart and pts bottom touched floor. Pt had soft impact d/t slow slide. Pt denied injury and got up on her own with standby assist. Pt first stated that she did not need to be checked, but after was notified of policy, agreed to let PA assess her. PA notified and assessed pt. PA states that pt is fine and cleared her to go to PACU. Notified Consulting civil engineer, Manufacturing engineer.

## 2013-11-26 NOTE — ED Notes (Signed)
Dr Gerrit Friends at pt bedside

## 2013-11-26 NOTE — Anesthesia Postprocedure Evaluation (Signed)
Anesthesia Post Note  Patient: [name]  Procedure(s) Performed: Procedure(s) (LRB): IRRIGATION AND DEBRIDEMENT ABSCESS (Left)  Anesthesia type: General  Patient location: PACU  Post pain: Pain level controlled  Post assessment: Post-op Vital signs reviewed  Last Vitals:  Filed Vitals:   11/26/13 1738  BP: 114/81  Pulse: 83  Temp:   Resp: 16    Post vital signs: Reviewed  Level of consciousness: sedated  Complications: No apparent anesthesia complications

## 2013-11-26 NOTE — Addendum Note (Signed)
Addendum created 11/26/13 2156 by Orest Dikes, CRNA   Modules edited: Anesthesia Events

## 2013-11-26 NOTE — ED Notes (Addendum)
Pt states that she noticed what appears to be an abscess underneath her L breast 5 days ago. Pt saw her PCP and was told that it was a cyst. Pt reports that at the time her PCP saw it. It was just a nodule under the skin. On assessment, pt has 3.5 cm area that is reddened with white lesions to center, with no noted drainage. Pt denies N/V/D or fevers. Pt is A&O and in NAD. Pt has 2 family members at bedside. Pt reports 9/10 pain.

## 2013-11-26 NOTE — ED Notes (Signed)
Verified pt ID with [place]. [place] at bedside at this time

## 2013-11-26 NOTE — ED Notes (Addendum)
Called OR to see what time pt will be having sx because pt has bed on the floor. OR wants pt to be taken to PACU to wait for surgery instead of going to floor.

## 2013-11-26 NOTE — ED Notes (Addendum)
Pt concerned that she is a diabetic and "feels hungry" Pt CBG 74. Notified PA Heather and she stated that she cannot eat at this time d/t surgical consult. Pt notified and states that "she can wait for a while."  Will continue to monitor.

## 2013-11-26 NOTE — Transfer of Care (Addendum)
Immediate Anesthesia Transfer of Care Note  Patient: [name]  Procedure(s) Performed: Procedure(s) (LRB): IRRIGATION AND DEBRIDEMENT ABSCESS (Left)  Patient Location: PACU  Anesthesia Type: General  Level of Consciousness: sedated, patient cooperative and responds to stimulation  Airway & Oxygen Therapy: Patient Spontanous Breathing and Patient connected to face mask oxgen  Post-op Assessment: Report given to PACU RN and Post -op Vital signs reviewed and stable (arrived in PACU and awaiting RN to give report to ).  Post vital signs: Reviewed and stable  Complications: No apparent anesthesia complications

## 2013-11-26 NOTE — ED Notes (Signed)
Pt was seen by pcp this week for left breast cyst. Since then cyst has gotten worse. Pain 9/10.

## 2013-11-26 NOTE — Op Note (Signed)
NAMESAFFIRE, PASKINS              ACCOUNT NO.:  [id number]  MEDICAL RECORD NO.:  [id number]  LOCATION:  [facility]                         FACILITY:  [facility]  PHYSICIAN:  Velora Heckler, MD      DATE OF BIRTH:  02/28/1971  DATE OF PROCEDURE:  11/26/2013                               OPERATIVE REPORT   PREOPERATIVE DIAGNOSIS:  Left breast abscess.  POSTOPERATIVE DIAGNOSES:  Left breast abscess and cutaneous mass.  PROCEDURE: 1. Incision and drainage of left breast abscess with open packing 2. Excisional biopsy of skin and subcutaneous tissue at the site of the     Abscess (4 x 2 cm)  SURGEON:  Velora Heckler, MD, FACS  ANESTHESIA:  General.  ESTIMATED BLOOD LOSS:  Minimal.  PREPARATION:  ChloraPrep.  COMPLICATIONS:  None.  INDICATIONS:  The patient is a 43 year old female, who presented to the emergency room with a 1-week history of an enlarging inflammatory mass in the lower outer quadrant of the left breast.  The patient was evaluated in the emergency department.  She was found to have a hard indurated erythematous fluctuant mass involving the skin of the lower outer quadrant of the left breast.  Ultrasound examination failed to show an underlying abscess of any significant size.  However, given the patient's has insulin-dependent diabetes, the decision was made to proceed with incision and drainage.  DESCRIPTION OF PROCEDURE:  Procedure was done in OR #1 at the [facility].  The patient was brought to the operating room, placed in supine position on the operating room table.  Following administration of general anesthesia, the patient was positioned and then prepped and draped in the usual aseptic fashion.  After ascertaining that an adequate level of anesthesia had been achieved, an elliptical incision was made around the area of the inflammatory process and central necrosis using the electrocautery for hemostasis. Dissection was carried full-thickness  through the skin and into the underlying subcutaneous tissues.  There was a small amount of purulent drainage and aerobic and anaerobic cultures were captured and submitted to the laboratory.  The remaining tissue was completely excised using the electrocautery for hemostasis.  The skin and subcutaneous tissue was submitted to Pathology for review.  Wound was irrigated with warm saline.  Wound was packed open with 1 inch iodoform gauze packing.  Dry gauze dressings were placed.  The patient was awakened from anesthesia and brought to the recovery room.  The patient tolerated the procedure well.   Velora Heckler, MD, [facility] Office: [phone]    TMG/MEDQ  D:  11/26/2013  T:  11/26/2013  Job:  [id number]  cc:   Burnell Blanks, MD Fax: [phone]

## 2013-11-26 NOTE — Anesthesia Preprocedure Evaluation (Addendum)
Anesthesia Evaluation    Airway Mallampati: II      Dental no notable dental hx.    Pulmonary asthma ,    Pulmonary exam normal       Cardiovascular hypertension, +CHF     Neuro/Psych  Headaches, PSYCHIATRIC DISORDERS  Neuromuscular disease    GI/Hepatic GERD-  ,  Endo/Other  diabetes  Renal/GU      Musculoskeletal   Abdominal Normal abdominal exam  (+)   Peds  Hematology  (+) anemia ,   Anesthesia Other Findings CHF- diffuse hypokinesia EF 25% on echo but 35% on Cath recently  Reproductive/Obstetrics                          Anesthesia Physical Anesthesia Plan  ASA: III  Anesthesia Plan: General   Post-op Pain Management:    Induction: Intravenous  Airway Management Planned: LMA  Additional Equipment:   Intra-op Plan:   Post-operative Plan:   Informed Consent: I have reviewed the patients History and Physical, chart, labs and discussed the procedure including the risks, benefits and alternatives for the proposed anesthesia with the patient or authorized representative who has indicated his/her understanding and acceptance.     Plan Discussed with: CRNA and Surgeon  Anesthesia Plan Comments:        Anesthesia Quick Evaluation

## 2013-11-26 NOTE — ED Notes (Addendum)
Pt transported by Bri, NT to PACU to wait for surgery at OR request. OR did not want pt to go to floor. Floor notified

## 2013-11-27 ENCOUNTER — Telehealth: Payer: Self-pay | Admitting: Internal Medicine

## 2013-11-27 ENCOUNTER — Encounter (HOSPITAL_COMMUNITY): Payer: Self-pay

## 2013-11-27 MED ORDER — CLINDAMYCIN HCL 300 MG PO CAPS: 300.0000 mg | ORAL_CAPSULE | Freq: Three times a day (TID) | ORAL | Status: DC

## 2013-11-27 MED ORDER — OXYCODONE-ACETAMINOPHEN 5-325 MG PO TABS: 1.0000 | ORAL_TABLET | Freq: Three times a day (TID) | ORAL | Status: DC | PRN

## 2013-11-27 NOTE — Care Management Note (Unsigned)
    Page 1 of 1   11/28/2013     4:03:05 PM CARE MANAGEMENT NOTE 11/28/2013  Patient:  [name], [name]   Account Number:  [id number]  Date Initiated:  11/27/2013  Documentation initiated by:  [facility]  Subjective/Objective Assessment:   43 year old female admitted with a breast abscess.     Action/Plan:   Needs HHRN services.   Anticipated DC Date:  11/27/2013   Anticipated DC Plan:  HOME W HOME HEALTH SERVICES      DC Planning Services  CM consult      Choice offered to / List presented to:  C-1 Patient        HH arranged  HH-1 RN      [facility] agency  [facility]   Status of service:  Completed, signed off Medicare Important Message given?   (If response is "NO", the following Medicare IM given date fields will be blank) Date Medicare IM given:   Date Additional Medicare IM given:    Discharge Disposition:  HOME W HOME HEALTH SERVICES  Per UR Regulation:  Reviewed for med. necessity/level of care/duration of stay  If discussed at Long Length of Stay Meetings, dates discussed:    Comments:  11/27/13 [facility] unable to service pt, referral made to [facility].  11/27/13 Algernon Huxley RN BSN [phone] Referral made to [facility].

## 2013-11-27 NOTE — Discharge Summary (Signed)
Physician Discharge Summary  [name] WUJ:[id number] DOB: 1971-01-16 DOA: 11/26/2013  PCP: [name], MD  Consultation: none  Admit date: 11/26/2013 Discharge date: 11/27/2013  Recommendations for Outpatient Follow-up:   Follow-up Information   Follow up with [facility] On 12/19/2013. (arrive by 2:15PM for a 2:45PM appt , For wound re-check)    Contact information:   [address]   [place] [address] [phone]      Discharge Diagnoses:  1. Left breast abscess with necrosis of skin   Surgical Procedure:  1. Incision and drainage of left breast abscess with open packing  2. Excisional biopsy of skin and subcutaneous tissue at the site of the  Abscess (4 x 2 cm)---Dr. Carolynne Edouard 11/26/13   Discharge Condition: stable Disposition: home  Diet recommendation: heart healthy, carb modified   Filed Weights   11/26/13 2156  Weight: 205 lb (92.987 kg)    Filed Vitals:   11/27/13 0600  BP: 135/80  Pulse:   Temp:   Resp:       Hospital Course:  [name] presented to [facility] with left breast abscess abscess.  She underwent an I&D which she tolerated well and was transferred to the floor.  She remained stable overnight.  She was continued on home meds.  On POD#1 the patient was tolerating dressing changes, afebrile, VSS, tolerating a diet and therefore felt stable for discharge home with [facility] for dressing changes.  She was placed on PO antibiotics, will follow cultures.  Follow up in 3 weeks for a wound check.   Physical Exam: General appearance: alert and oriented. Calm and cooperative No acute distress.  Resp: clear to auscultation bilaterally  Cardio: S1S1 RRR without murmurs or gallops. No edema. GI: soft round and nontender. +BS x4 quadrants. No organomegaly, hernias or masses.  Pulses: +2 bilateral distal pulses without cyanosis  Neurologic: Mental status: Alert, oriented, thought content appropriate  Skin: left breast wound is  clean, no surrounding erythema or induration.     Discharge Instructions     Medication List         albuterol 108 (90 BASE) MCG/ACT inhaler  Commonly known as:  PROVENTIL HFA;VENTOLIN HFA  - Inhale 2 puffs into the lungs every 6 (six) hours as needed. WHEEZING  -      aspirin EC 81 MG tablet  Take 1 tablet (81 mg total) by mouth daily.     Biotin 1000 MCG tablet  Take 1,000 mcg by mouth daily.     buPROPion 300 MG 24 hr tablet  Commonly known as:  WELLBUTRIN XL  Take 300 mg by mouth daily.     citalopram 20 MG tablet  Commonly known as:  CELEXA  Take 1 tablet (20 mg total) by mouth daily. For depression.     clindamycin 300 MG capsule  Commonly known as:  CLEOCIN  Take 1 capsule (300 mg total) by mouth 3 (three) times daily.     cyclobenzaprine 10 MG tablet  Commonly known as:  FLEXERIL  Take 10 mg by mouth 3 (three) times daily as needed for muscle spasms.     fluticasone 50 MCG/ACT nasal spray  Commonly known as:  FLONASE  Place 50 sprays into both nostrils daily.     furosemide 40 MG tablet  Commonly known as:  LASIX  Take 40 mg by mouth 2 (two) times daily.     insulin glargine 100 UNIT/ML injection  Commonly known as:  LANTUS  Inject 74 Units into the skin daily. For control of blood sugar     lidocaine 5 %  Commonly known as:  LIDODERM  Place 1 patch onto the skin daily as needed (for back pain).     lubiprostone 24 MCG capsule  Commonly known as:  AMITIZA  Take 1 capsule (24 mcg total) by mouth 2 (two) times daily with a meal.     methocarbamol 750 MG tablet  Commonly known as:  ROBAXIN  Take 750 mg by mouth every 6 (six) hours as needed for muscle spasms.     metolazone 2.5 MG tablet  Commonly known as:  ZAROXOLYN  Take 1 tablet (2.5 mg total) by mouth daily.     nebivolol 2.5 MG tablet  Commonly known as:  BYSTOLIC  Take 1 tablet (2.5 mg total) by mouth daily.     olmesartan 20 MG tablet  Commonly known as:  BENICAR  Take 1 tablet (20  mg total) by mouth daily.     oxyCODONE-acetaminophen 10-325 MG per tablet  Commonly known as:  PERCOCET  Take 0.5-1 tablets by mouth every 4 (four) hours as needed for pain.     oxyCODONE-acetaminophen 5-325 MG per tablet  Commonly known as:  PERCOCET/ROXICET  Take 1-2 tablets by mouth every 8 (eight) hours as needed for moderate pain.     potassium chloride SA 20 MEQ tablet  Commonly known as:  KLOR-CON M20  Take 1 tablet (20 mEq total) by mouth daily.     pregabalin 150 MG capsule  Commonly known as:  LYRICA  Take 150 mg by mouth 3 (three) times daily.     ranitidine 150 MG tablet  Commonly known as:  ZANTAC  Take 150 mg by mouth daily.     tiZANidine 4 MG tablet  Commonly known as:  ZANAFLEX  Take 4 mg by mouth every 6 (six) hours as needed (for pain).     VOLTAREN 1 % Gel  Generic drug:  diclofenac sodium  Apply 2 g topically 4 (four) times daily.           Follow-up Information   Follow up with [facility] On 12/19/2013. (arrive by 2:15PM for a 2:45PM appt , For wound re-check)    Contact information:   [address]   [place] [address] [phone]        The results of significant diagnostics from this hospitalization (including imaging, microbiology, ancillary and laboratory) are listed below for reference.    Significant Diagnostic Studies: US Breast Ltd Uni Left Inc Axilla  11/26/2013   CLINICAL DATA:  Left breast abscess.  EXAM: ULTRASOUND OF THE left BREAST  COMPARISON:  None.  FINDINGS: Ultrasound is performed, showing no abscess. Mass like area is present in the left breast at the 5 o'clock position 2 cm from the nipple. Hypervascularity was present.  IMPRESSION: Negative for abscess. Clinical followup and dedicated breast ultrasound at the breast center recommended.   Electronically Signed   By: Andreas Newport M.D.   On: 11/26/2013 17:53    Microbiology: Recent Results (from the past 240 hour(s))  ANAEROBIC CULTURE      Status: None   Collection Time    11/26/13  8:14 PM      Result Value Ref Range Status   Specimen Description WOUND LEFT BREAST    Final   Special Requests NONE   Final   Gram Stain  Final   Value: RARE WBC PRESENT, PREDOMINANTLY PMN     RARE SQUAMOUS EPITHELIAL CELLS PRESENT     NO ORGANISMS SEEN     Performed at [facility]   Culture PENDING   Incomplete   Report Status PENDING   Incomplete  CULTURE, ROUTINE-ABSCESS     Status: None   Collection Time    11/26/13  8:14 PM      Result Value Ref Range Status   Specimen Description WOUND LEFT BREAST   Final   Special Requests NONE   Final   Gram Stain     Final   Value: FEW WBC PRESENT, PREDOMINANTLY PMN     NO SQUAMOUS EPITHELIAL CELLS SEEN     FEW GRAM POSITIVE COCCI IN PAIRS     Performed at [facility]   Culture PENDING   Incomplete   Report Status PENDING   Incomplete     Labs: Basic Metabolic Panel:  Recent Labs Lab 11/26/13 1651  NA 140  K 4.5  CL 101  CO2 27  GLUCOSE 73  BUN 27*  CREATININE 1.12*  CALCIUM 9.3   Liver Function Tests: No results found for this basename: AST, ALT, ALKPHOS, BILITOT, PROT, ALBUMIN,  in the last 168 hours No results found for this basename: LIPASE, AMYLASE,  in the last 168 hours No results found for this basename: AMMONIA,  in the last 168 hours CBC:  Recent Labs Lab 11/21/13 1037 11/26/13 1651  WBC 8.3 8.2  NEUTROABS 5.4 4.9  HGB 11.5* 11.4*  HCT 37.0 35.0*  MCV 77.6* 75.9*  PLT 291 302   Cardiac Enzymes: No results found for this basename: CKTOTAL, CKMB, CKMBINDEX, TROPONINI,  in the last 168 hours BNP: BNP (last 3 results)  Recent Labs  10/01/13 0928 11/02/13 1649  PROBNP 1711.0* 45.0   CBG:  Recent Labs Lab 11/26/13 1639 11/26/13 2045 11/26/13 2047 11/26/13 2109 11/26/13 2313  GLUCAP 74 53* 48* 99 123*    Principal Problem:   Left breast abscess   Time coordinating discharge: <30 mins  Signed:  Rosalind Guido,  ANP-BC

## 2013-11-27 NOTE — Progress Notes (Signed)
UR Completed.  Lian Tanori Jane [phone] 11/27/2013  

## 2013-11-27 NOTE — Discharge Instructions (Signed)

## 2013-11-27 NOTE — Telephone Encounter (Signed)
s.w pt and advised on July appt moved dueto MD out of the office...pt ok .Marland KitchenMarland Kitchenpt requested i call back and lvm..done lvm with all info

## 2013-11-27 NOTE — Discharge Summary (Signed)
Seen and agree [name]

## 2013-11-28 ENCOUNTER — Telehealth (INDEPENDENT_AMBULATORY_CARE_PROVIDER_SITE_OTHER): Payer: Self-pay

## 2013-11-28 ENCOUNTER — Encounter (HOSPITAL_COMMUNITY): Payer: Self-pay | Admitting: Surgery

## 2013-11-28 LAB — GLUCOSE, CAPILLARY
GLUCOSE-CAPILLARY: 390 mg/dL — AB (ref 70–99)
Glucose-Capillary: 448 mg/dL — ABNORMAL HIGH (ref 70–99)

## 2013-11-28 NOTE — Telephone Encounter (Signed)
Patient should change dressing at least once daily, preferably twice daily.  Patient should shower with wound open and dressings out once daily.  Arrange follow up in [facility] clinic for wound check next week.  Velora Heckler, MD, [facility] Office: [phone]

## 2013-11-28 NOTE — Telephone Encounter (Signed)
Nurse states that she did not have a lot of supplies to work with for caring for pt's wound. She advised that she cleaned the wound with saline, gauzed, and taped. She would like to know if that's ok to do every 3 days or if you have other instructions. Please advise.

## 2013-11-29 LAB — CULTURE, ROUTINE-ABSCESS

## 2013-11-29 NOTE — ED Provider Notes (Signed)
Medical screening examination/treatment/procedure(s) were performed by non-physician practitioner and as supervising physician I was immediately available for consultation/collaboration.   EKG Interpretation None        Violet Seabury, MD 11/29/13 1544 

## 2013-11-29 NOTE — Telephone Encounter (Signed)
Debra at [employer] advised per Dr Ardine Eng request that pt should have saline moist gauze packing at least once a day or twice would be better. Cover with dry drsg. Pt or family member can be taught to do wd care. She states understanding of orders and will start order today.

## 2013-12-01 ENCOUNTER — Encounter: Payer: Self-pay | Admitting: Cardiology

## 2013-12-01 ENCOUNTER — Ambulatory Visit (INDEPENDENT_AMBULATORY_CARE_PROVIDER_SITE_OTHER): Payer: Medicare HMO | Admitting: Cardiology

## 2013-12-01 VITALS — BP 125/84 | HR 95 | Ht 65.5 in | Wt 212.0 lb

## 2013-12-01 DIAGNOSIS — E119 Type 2 diabetes mellitus without complications: Secondary | ICD-10-CM

## 2013-12-01 DIAGNOSIS — Z794 Long term (current) use of insulin: Secondary | ICD-10-CM

## 2013-12-01 DIAGNOSIS — IMO0001 Reserved for inherently not codable concepts without codable children: Secondary | ICD-10-CM

## 2013-12-01 DIAGNOSIS — I5023 Acute on chronic systolic (congestive) heart failure: Secondary | ICD-10-CM

## 2013-12-01 LAB — ANAEROBIC CULTURE

## 2013-12-01 MED ORDER — FLUCONAZOLE 150 MG PO TABS: 150.0000 mg | ORAL_TABLET | Freq: Every day | ORAL | Status: DC

## 2013-12-01 NOTE — Progress Notes (Signed)
Patient ID: IMONIE TUCH, female   DOB: [birth year], 43 y.o.   MRN: [id number]    Patient Name: [name] Date of Encounter: 12/01/2013  Primary Care Provider:  [name], MD Primary Cardiologist:  Dorothy Spark  Problem List   Past Medical History  Diagnosis Date  . Arthritis   . Asthma   . GERD (gastroesophageal reflux disease)   . Diabetes mellitus   . Hyperlipidemia   . Migraines   . Hypertension   . Back pain, chronic   . Depression   . Neuropathy   . Anxiety   . Nervous breakdown sept 2013  . Fibromyalgia   . History of gastric bypass Jan 2013  . Insomnia   . Anemia   . CHF (congestive heart failure)    Past Surgical History  Procedure Laterality Date  . Back surgery  November 1992  . Cesarean section  December 1998  . Tubal ligation  January 1999  . Cholecystectomy  1998  . Gastric roux-en-y  06/30/2011    Procedure: LAPAROSCOPIC ROUX-EN-Y GASTRIC;  Surgeon: Edward Jolly, MD;  Location: [facility];  Service: General;  Laterality: N/A;     . Esophagogastroduodenoscopy  07/27/2011    Procedure: ESOPHAGOGASTRODUODENOSCOPY (EGD);  Surgeon: Landry Dyke, MD;  Location: [facility];  Service: Endoscopy;  Laterality: N/A;  . Irrigation and debridement abscess Left 11/26/2013    Procedure: IRRIGATION AND DEBRIDEMENT ABSCESS;  Surgeon: Earnstine Regal, MD;  Location: [facility];  Service: General;  Laterality: Left;   Allergies  Allergies  Allergen Reactions  . Imitrex [Sumatriptan] Nausea Only    Can not take the pill but she take the injection   HPI  A 43 year old female with prior medical history of obesity status post gastric bypass surgery, diabetes mellitus x 17 years and hypertension. The patient recently presented to the emergency room on 10/01/2013 with dyspnea and low energy. At that time, her hemoglobin was 8.4, and she was not transfused. Her FOBT test was negative. Liver function tests were elevated (AST 136, ALT 66, Alk phos 203) and  pro-BNP was high at 1711. CXR showed cardiomegaly, mild vascular congestion, small bilateral effusions.  At bedtime this was his first diagnosis of congestive heart failure and she was started on treatment with diuretics. She was afterwards refer to my clinic. She has been on Lasix 40 mg for the last 3 months that was increased to 80 mg daily in the ER.  The patient states that she has been experiencing dyspnea on exertion and lower extremity edema for at least 3 months. It has progressed over the last couple of weeks when she started to develop orthopnea currently 4-5 pillows with minimal improvement after initiation an increase of Lasix. She reports that some of her lower extremity edema has improved however orthopnea is at the same degree. She was prescribed lisinopril that gave her dry cough and was switched to Benicar 20 mg po daily.  She admits to occasional exertional chest tightness that is alleviated at rest. No palpitations or syncope. She has never smoked and does have significant family history of heart failure or premature coronary artery disease. The patient states that her sister died at age of 79 secondary to congestive heart failure. She underwent cardiac transplant and died shortly after. She is unsure what diagnosis she exactly had but states that she was also a heavy alcohol drinker. She has multiple family members with premature coronary artery disease she has 3 uncles that had  bypass surgery one of them was in his 49s the other in his 89s. Her grandfather also had coronary artery bypass surgery in his 103s.  At the last visit she was significantly fluid overloaded and we added metolazone 2.5 mg daily to her regimen. We also added by Bystolic 2.5 mg daily as she has a history of asthma.  She is coming after 3 weeks with no improvement and still feels profoundly short of breath even when walking short distances at home such as walking one flight of stairs. She feels profoundly tired all  the time. She is experiencing paroxysmal nocturnal dyspnea and has ongoing lower extremity edema.  12/01/2013 - the patient underwent right and left cardiac catheterization she had no coronary artery disease, normal filling pressures elevated right-sided pressures and LVEF of 35%. The patient reports significant dyspnea on exertion with no improvement so far. She feels fatigued all the time. She denies any lower history edema but complains of paroxysmal nocturnal dyspnea. The patient was also treated for abscess in her left breast and currently taking clindamycin after which she developed fungal infection in her genital area.   Home Medications  Prior to Admission medications   Medication Sig Start Date End Date Taking? Authorizing Provider  albuterol (PROVENTIL HFA;VENTOLIN HFA) 108 (90 BASE) MCG/ACT inhaler Inhale 2 puffs into the lungs every 6 (six) hours as needed. WHEEZING    Yes Historical Provider, MD  Biotin 1000 MCG tablet Take 1,000 mcg by mouth daily.   Yes Historical Provider, MD  buPROPion (WELLBUTRIN XL) 300 MG 24 hr tablet Take 300 mg by mouth daily. 02/23/13  Yes Historical Provider, MD  citalopram (CELEXA) 20 MG tablet Take 1 tablet (20 mg total) by mouth daily. For depression. 02/19/12 06/04/14 Yes Darrol Jump, MD  ferrous fumarate (HEMOCYTE - 106 MG FE) 325 (106 FE) MG TABS tablet Take 1 tablet by mouth 2 (two) times daily.   Yes Historical Provider, MD  fluticasone (FLONASE) 50 MCG/ACT nasal spray Place 50 sprays into both nostrils daily.  03/07/13  Yes Historical Provider, MD  furosemide (LASIX) 80 MG tablet Take 80 mg by mouth daily.   Yes Historical Provider, MD  insulin glargine (LANTUS) 100 UNIT/ML injection Inject 74 Units into the skin at bedtime. For control of blood sugar 02/19/12  Yes Darrol Jump, MD  lidocaine (LIDODERM) 5 % Place 1 patch onto the skin daily as needed (for back pain).  03/21/13  Yes Historical Provider, MD  lubiprostone (AMITIZA) 24 MCG capsule Take  1 capsule (24 mcg total) by mouth 2 (two) times daily with a meal. 03/17/12  Yes Hosie Poisson, MD  oxyCODONE-acetaminophen (PERCOCET) 10-325 MG per tablet Take 1 tablet by mouth every 4 (four) hours as needed for pain.   Yes Historical Provider, MD  polyethylene glycol (MIRALAX / GLYCOLAX) packet Take 17 g by mouth 2 (two) times daily. 03/17/12  Yes Hosie Poisson, MD  pregabalin (LYRICA) 150 MG capsule Take 150 mg by mouth 3 (three) times daily.   Yes Historical Provider, MD  ranitidine (ZANTAC) 150 MG tablet Take 150 mg by mouth daily. 02/11/13  Yes Historical Provider, MD  tiZANidine (ZANAFLEX) 4 MG tablet Take 4 mg by mouth every 6 (six) hours as needed (for pain). 04/04/13  Yes Marcial Pacas, MD  VOLTAREN 1 % GEL Apply 2 g topically 4 (four) times daily.  03/18/13  Yes Historical Provider, MD    Family History  Family History  Problem Relation Age of Onset  .  Diabetes Maternal Aunt   . Diabetes Paternal Aunt   . Diabetes Paternal Grandfather     Social History  History   Social History  . Marital Status: Single    Spouse Name: N/A    Number of Children: N/A  . Years of Education: N/A   Occupational History  . Not on file.   Social History Main Topics  . Smoking status: Never Smoker   . Smokeless tobacco: Never Used  . Alcohol Use: No  . Drug Use: No  . Sexual Activity: No   Other Topics Concern  . Not on file   Social History Narrative  . No narrative on file     Review of Systems, as per HPI, otherwise negative General:  No chills, fever, night sweats or weight changes.  Cardiovascular:  No chest pain, dyspnea on exertion, edema, orthopnea, palpitations, paroxysmal nocturnal dyspnea. Dermatological: No rash, lesions/masses Respiratory: No cough, dyspnea Urologic: No hematuria, dysuria Abdominal:   No nausea, vomiting, diarrhea, bright red blood per rectum, melena, or hematemesis Neurologic:  No visual changes, wkns, changes in mental status. All other systems reviewed  and are otherwise negative except as noted above.  Physical Exam  Blood pressure 125/84, pulse 95, height 5' 5.5" (1.664 m), weight 212 lb (96.163 kg).  General: Pleasant, NAD Psych: Normal affect. Neuro: Alert and oriented X 3. Moves all extremities spontaneously. HEENT: Normal  Neck: Supple without bruits or JVD. Lungs:  Resp regular and unlabored, mild crackles at the bases.Marland Kitchen Heart: RRR no s3, s4, or murmurs. Abdomen: Soft, non-tender, non-distended, BS + x 4.  Extremities: No clubbing, cyanosis , bilateral pitting lower extremity edema +1. DP/PT/Radials 2+ and equal bilaterally.  Labs:  No results found for this basename: CKTOTAL, CKMB, TROPONINI,  in the last 72 hours Lab Results  Component Value Date   WBC 8.2 11/26/2013   HGB 11.4* 11/26/2013   HCT 35.0* 11/26/2013   MCV 75.9* 11/26/2013   PLT 302 11/26/2013    No results found for this basename: DDIMER   No components found with this basename: POCBNP,     Component Value Date/Time   NA 140 11/26/2013 1651   NA 144 10/12/2013 1039   NA 141 04/12/2013 1026   K 4.5 11/26/2013 1651   K 3.7 10/12/2013 1039   CL 101 11/26/2013 1651   CO2 27 11/26/2013 1651   CO2 23 10/12/2013 1039   GLUCOSE 73 11/26/2013 1651   GLUCOSE 45* 10/12/2013 1039   GLUCOSE 94 04/12/2013 1026   BUN 27* 11/26/2013 1651   BUN 11.4 10/12/2013 1039   BUN 12 04/12/2013 1026   CREATININE 1.12* 11/26/2013 1651   CREATININE 0.8 10/12/2013 1039   CREATININE 0.76 03/20/2011 1302   CALCIUM 9.3 11/26/2013 1651   CALCIUM 8.8 10/12/2013 1039   PROT 7.7 11/02/2013 1649   PROT 7.2 10/12/2013 1039   PROT 6.8 04/12/2013 1026   ALBUMIN 3.4* 11/02/2013 1649   ALBUMIN 2.9* 10/12/2013 1039   AST 38* 11/02/2013 1649   AST 63* 10/12/2013 1039   ALT 47* 11/02/2013 1649   ALT 44 10/12/2013 1039   ALKPHOS 187* 11/02/2013 1649   ALKPHOS 220* 10/12/2013 1039   BILITOT 0.2 11/02/2013 1649   BILITOT 0.41 10/12/2013 1039   GFRNONAA 60* 11/26/2013 1651   GFRAA 69* 11/26/2013 1651   Lab  Results  Component Value Date   CHOL 204* 03/20/2011   HDL 64 03/20/2011   LDLCALC 117* 03/20/2011   TRIG 114 03/20/2011  Accessory Clinical Findings  Echocardiogram - 11/01/13 Left ventricle: The cavity size was normal. Wall thickness was normal. Systolic function was severely reduced. The estimated ejection fraction was in the range of 20% to 25%. Diffuse hypokinesis. Features are consistent with a pseudonormal left ventricular filling pattern, with concomitant abnormal relaxation and increased filling pressure (grade 2 diastolic dysfunction). - Left atrium: The atrium was mildly dilated. - Atrial septum: No defect or patent foramen ovale was identified. - Right ventricle: The cavity size was normal. Wall thickness was normal. Systolic function was normal.  ECG - sinus tachycardia, LVH with repolarization abnormalities, lateral ischemia can't be excluded.  Lexiscan nuclear stress test: Impression  Exercise Capacity: Lexiscan with no exercise.  BP Response: Normal blood pressure response.  Clinical Symptoms: No significant symptoms noted.  ECG Impression: No significant ECG changes with Lexiscan.  Comparison with Prior Nuclear Study: No previous nuclear study performed  Overall Impression: Normal stress nuclear perfusion study. Findings are most compatible with nonischemic cardiomyopathy.  LV Ejection Fraction: 26%. LV Wall Motion: Global LV hypokinesis.  Sanda Klein, MD, [facility]   Right and left cath: 11/14/2013  Cardiac Output (Fick) 6.72  Cardiac Index (Fick) 3.36  PVR 1.8 WU  Coronary angiography:  Coronary dominance: right  Left mainstem: No angiographic CAD  Left anterior descending (LAD): No angiographic CAD  Left circumflex (LCx): No angiographic CAD  Right coronary artery (RCA): No angiographic CAD  Left ventriculography: Left ventricle was moderately hypokinetic diffusely, LVEF is estimated at 35%, there is no significant mitral regurgitation.  Final Conclusions:  Normal LV filling pressure with mildly elevated RV filling pressure, possibly suggesting RV failure out of proportion to LV failure. EF about 35% with diffuse hypokinesis. No angiographic CAD, nonischemic cardiomyopathy.    Assessment & Plan  A 43 year old female with prior medical history significant for non-insulin-dependent diabetes mellitus, obesity, hyperlipidemia and hypertension with new diagnoses of systolic congestive heart failure.   The echocardiogram showed normal left ventricular size with severely reduced function and left ventricular ejection fraction of 20-25%, and pseudonormal filling pattern. There is normal right ventricular function. The patient underwent a Lexiscan nuclear stress test that showed no scar and no ischemia. These findings are most consistent with nonischemic cardiomyopathy.  The patient has been on high doses of diuretics now for 4 months, on ACEI/ARB for the last month as well as beta blocker. There is no significant proven and she is still NYHA II-II class.   Left and right cardiac cath showed Normal LV filling pressure with mildly elevated RV filling pressure, possibly suggesting RV failure out of proportion to LV failure. EF about 35% with diffuse hypokinesis. No angiographic CAD, nonischemic cardiomyopathy.  The patient appears euvolemic today but her symptoms out of proportion to her clinical picture numbers of right-sided cath. Patient is just exhausted all the time and short of breath with minimal activity. We will send referral for cardiac rehabilitation for deconditioning. We will also refer her for metabolic testing for an assessment of view of 2 months. Followup in 2 months.  Will prescribe her Diflucan 150 mg orally once daily for 3 days.    Dorothy Spark, MD, [facility] 12/01/2013, 10:22 AM

## 2013-12-01 NOTE — Patient Instructions (Signed)
Your physician has recommended you make the following change in your medication:   START TAKING DIFLUCAN 150 MG FOR 3 DAYS NOW  Your physician has recommended that you have a cardiopulmonary stress test (CPX). CPX testing is a non-invasive measurement of heart and lung function. It replaces a traditional treadmill stress test. This type of test provides a tremendous amount of information that relates not only to your present condition but also for future outcomes. This test combines measurements of you ventilation, respiratory gas exchange in the lungs, electrocardiogram (EKG), blood pressure and physical response before, during, and following an exercise protocol.  You have been referred to [facility]   Your physician recommends that you schedule a follow-up appointment in: 2 MONTHS WITH DR Delton See

## 2013-12-05 ENCOUNTER — Other Ambulatory Visit: Payer: Self-pay

## 2013-12-05 ENCOUNTER — Telehealth: Payer: Self-pay | Admitting: Cardiology

## 2013-12-05 DIAGNOSIS — S299XXA Unspecified injury of thorax, initial encounter: Secondary | ICD-10-CM | POA: Insufficient documentation

## 2013-12-05 NOTE — Telephone Encounter (Signed)
New problem    Pt called and gave number to have  [name] wound specialist # [phone] fax [phone].    Please fax a note to weather she can go under anesthesia.

## 2013-12-05 NOTE — Telephone Encounter (Signed)
Spoke with Dr. Charisse March nurse, wound care specialist at [facility].  Dr Leonie Green nurse requesting cardiac clearance letter to be written b/c pt is needing to have surgery to address her recently infected breast.  Informed Dr Leonie Green nurse that Dr Delton See is out of the office until next Monday.  Nurse stated that was completely fine, as this matter does not need immediate intervention.  Informed nurse that I will forward this message to Dr Delton See for further review and recommendation and will further advise with a letter.  Dr Leonie Green nurse verbalized understanding and agrees with this plan.

## 2013-12-11 ENCOUNTER — Encounter: Payer: Self-pay | Admitting: Cardiology

## 2013-12-12 ENCOUNTER — Encounter (HOSPITAL_BASED_OUTPATIENT_CLINIC_OR_DEPARTMENT_OTHER): Payer: Self-pay | Admitting: *Deleted

## 2013-12-12 ENCOUNTER — Encounter: Payer: Self-pay | Admitting: Cardiology

## 2013-12-12 NOTE — Progress Notes (Signed)
Surgery on 12/14/2013.  Need orders in EPIC.  Thank You.  

## 2013-12-13 ENCOUNTER — Telehealth: Payer: Self-pay | Admitting: Cardiology

## 2013-12-13 ENCOUNTER — Encounter (HOSPITAL_COMMUNITY): Payer: Self-pay | Admitting: *Deleted

## 2013-12-13 NOTE — Progress Notes (Signed)
Surgery on 12/14/2013.  Need orders in EPIC.  Thank You.

## 2013-12-13 NOTE — Telephone Encounter (Signed)
New Message:  Pt is requesting a call from [name] [name]. Pt states she does not want to give a reason for calling. She requests a call back from [name]

## 2013-12-13 NOTE — Telephone Encounter (Signed)
You can prescribe her Xanax 0.25 twice a day when necessary for 1 month only.

## 2013-12-13 NOTE — Telephone Encounter (Signed)
Pt calling to ask Dr Delton See if she would prescribe her Xanax for her anxiety and nerve issues, due to all the recent tests and procedures she has had done.  Informed pt that I will forward this message to Dr Delton See for further review and recommendation.  Pt verbalized understanding and agrees with this plan.

## 2013-12-14 ENCOUNTER — Ambulatory Visit (HOSPITAL_BASED_OUTPATIENT_CLINIC_OR_DEPARTMENT_OTHER): Admission: RE | Admit: 2013-12-14 | Payer: Medicare HMO | Source: Ambulatory Visit | Admitting: Plastic Surgery

## 2013-12-14 ENCOUNTER — Encounter (HOSPITAL_COMMUNITY): Payer: Medicare HMO | Admitting: *Deleted

## 2013-12-14 ENCOUNTER — Ambulatory Visit (HOSPITAL_COMMUNITY): Payer: Medicare HMO | Admitting: *Deleted

## 2013-12-14 ENCOUNTER — Ambulatory Visit (HOSPITAL_COMMUNITY)
Admission: RE | Admit: 2013-12-14 | Discharge: 2013-12-14 | Disposition: A | Discharge status: Home / Self Care | Payer: Medicare HMO | Source: Ambulatory Visit | Attending: Plastic Surgery | Admitting: Plastic Surgery

## 2013-12-14 ENCOUNTER — Ambulatory Visit (HOSPITAL_COMMUNITY): Payer: Medicare HMO

## 2013-12-14 ENCOUNTER — Encounter (HOSPITAL_BASED_OUTPATIENT_CLINIC_OR_DEPARTMENT_OTHER): Admission: RE | Payer: Self-pay | Source: Ambulatory Visit

## 2013-12-14 ENCOUNTER — Encounter (HOSPITAL_COMMUNITY)
Admission: RE | Disposition: A | Discharge status: Home / Self Care | Payer: Self-pay | Source: Ambulatory Visit | Attending: Plastic Surgery

## 2013-12-14 ENCOUNTER — Encounter (HOSPITAL_COMMUNITY): Payer: Self-pay | Admitting: *Deleted

## 2013-12-14 ENCOUNTER — Other Ambulatory Visit: Payer: Self-pay | Admitting: Plastic Surgery

## 2013-12-14 DIAGNOSIS — Z7982 Long term (current) use of aspirin: Secondary | ICD-10-CM | POA: Insufficient documentation

## 2013-12-14 DIAGNOSIS — I10 Essential (primary) hypertension: Secondary | ICD-10-CM | POA: Insufficient documentation

## 2013-12-14 DIAGNOSIS — J45909 Unspecified asthma, uncomplicated: Secondary | ICD-10-CM | POA: Insufficient documentation

## 2013-12-14 DIAGNOSIS — I428 Other cardiomyopathies: Secondary | ICD-10-CM | POA: Insufficient documentation

## 2013-12-14 DIAGNOSIS — S21002S Unspecified open wound of left breast, sequela: Secondary | ICD-10-CM

## 2013-12-14 DIAGNOSIS — G589 Mononeuropathy, unspecified: Secondary | ICD-10-CM | POA: Insufficient documentation

## 2013-12-14 DIAGNOSIS — F319 Bipolar disorder, unspecified: Secondary | ICD-10-CM | POA: Insufficient documentation

## 2013-12-14 DIAGNOSIS — Z794 Long term (current) use of insulin: Secondary | ICD-10-CM | POA: Insufficient documentation

## 2013-12-14 DIAGNOSIS — E119 Type 2 diabetes mellitus without complications: Secondary | ICD-10-CM | POA: Insufficient documentation

## 2013-12-14 DIAGNOSIS — T8189XA Other complications of procedures, not elsewhere classified, initial encounter: Secondary | ICD-10-CM | POA: Diagnosis not present

## 2013-12-14 DIAGNOSIS — K219 Gastro-esophageal reflux disease without esophagitis: Secondary | ICD-10-CM | POA: Insufficient documentation

## 2013-12-14 DIAGNOSIS — Z79899 Other long term (current) drug therapy: Secondary | ICD-10-CM | POA: Insufficient documentation

## 2013-12-14 DIAGNOSIS — E785 Hyperlipidemia, unspecified: Secondary | ICD-10-CM | POA: Insufficient documentation

## 2013-12-14 DIAGNOSIS — I509 Heart failure, unspecified: Secondary | ICD-10-CM | POA: Insufficient documentation

## 2013-12-14 DIAGNOSIS — F411 Generalized anxiety disorder: Secondary | ICD-10-CM | POA: Insufficient documentation

## 2013-12-14 DIAGNOSIS — IMO0001 Reserved for inherently not codable concepts without codable children: Secondary | ICD-10-CM | POA: Insufficient documentation

## 2013-12-14 DIAGNOSIS — Y838 Other surgical procedures as the cause of abnormal reaction of the patient, or of later complication, without mention of misadventure at the time of the procedure: Secondary | ICD-10-CM | POA: Insufficient documentation

## 2013-12-14 DIAGNOSIS — I5022 Chronic systolic (congestive) heart failure: Secondary | ICD-10-CM | POA: Insufficient documentation

## 2013-12-14 HISTORY — DX: Bipolar disorder, unspecified: F31.9

## 2013-12-14 HISTORY — DX: Acute on chronic systolic (congestive) heart failure: I50.23

## 2013-12-14 HISTORY — DX: Other cardiomyopathies: I42.8

## 2013-12-14 HISTORY — DX: Shortness of breath: R06.02

## 2013-12-14 HISTORY — PX: INCISION AND DRAINAGE OF WOUND: SHX1803

## 2013-12-14 HISTORY — DX: Unspecified open wound of left breast, initial encounter: S21.002A

## 2013-12-14 LAB — BASIC METABOLIC PANEL
Anion gap: 12 (ref 5–15)
BUN: 38 mg/dL — AB (ref 6–23)
CO2: 26 mEq/L (ref 19–32)
Calcium: 9.1 mg/dL (ref 8.4–10.5)
Chloride: 100 mEq/L (ref 96–112)
Creatinine, Ser: 1.27 mg/dL — ABNORMAL HIGH (ref 0.50–1.10)
GFR calc Af Amer: 59 mL/min — ABNORMAL LOW (ref >90)
GFR, EST NON AFRICAN AMERICAN: 51 mL/min — AB (ref >90)
GLUCOSE: 198 mg/dL — AB (ref 70–99)
Potassium: 4.4 mEq/L (ref 3.7–5.3)
Sodium: 138 mEq/L (ref 137–147)

## 2013-12-14 LAB — GLUCOSE, CAPILLARY
Glucose-Capillary: 199 mg/dL — ABNORMAL HIGH (ref 70–99)
Glucose-Capillary: 89 mg/dL (ref 70–99)

## 2013-12-14 LAB — HCG, SERUM, QUALITATIVE: Preg, Serum: NEGATIVE

## 2013-12-14 SURGERY — IRRIGATION AND DEBRIDEMENT WOUND
Anesthesia: General | Site: Breast | Laterality: Left

## 2013-12-14 SURGERY — IRRIGATION AND DEBRIDEMENT EXTREMITY
Anesthesia: General | Laterality: Left

## 2013-12-14 MED ORDER — MIDAZOLAM HCL 5 MG/5ML IJ SOLN
INTRAMUSCULAR | Status: DC | PRN
  Administered 2013-12-14 (×2): 1 mg via INTRAVENOUS

## 2013-12-14 MED ORDER — BACITRACIN-NEOMYCIN-POLYMYXIN 400-5-5000 EX OINT
TOPICAL_OINTMENT | CUTANEOUS | Status: AC
Start: 2013-12-14 — End: 2013-12-14
  Filled 2013-12-14: qty 1

## 2013-12-14 MED ORDER — HYDROMORPHONE HCL PF 1 MG/ML IJ SOLN
INTRAMUSCULAR | Status: AC
  Filled 2013-12-14: qty 1

## 2013-12-14 MED ORDER — FENTANYL CITRATE 0.05 MG/ML IJ SOLN
INTRAMUSCULAR | Status: AC
  Administered 2013-12-14: 25 ug
  Filled 2013-12-14: qty 2

## 2013-12-14 MED ORDER — LIDOCAINE HCL 1 % IJ SOLN
INTRAMUSCULAR | Status: DC | PRN
  Administered 2013-12-14: 50 mg via INTRADERMAL

## 2013-12-14 MED ORDER — CEFAZOLIN SODIUM-DEXTROSE 2-3 GM-% IV SOLR
INTRAVENOUS | Status: DC | PRN
  Administered 2013-12-14: 2 g via INTRAVENOUS

## 2013-12-14 MED ORDER — FENTANYL CITRATE 0.05 MG/ML IJ SOLN
INTRAMUSCULAR | Status: DC | PRN
  Administered 2013-12-14: 75 ug via INTRAVENOUS
  Administered 2013-12-14: 25 ug via INTRAVENOUS

## 2013-12-14 MED ORDER — PHENYLEPHRINE HCL 10 MG/ML IJ SOLN
INTRAMUSCULAR | Status: DC | PRN
  Administered 2013-12-14: 80 ug via INTRAVENOUS
  Administered 2013-12-14 (×2): 40 ug via INTRAVENOUS
  Administered 2013-12-14: 60 ug via INTRAVENOUS

## 2013-12-14 MED ORDER — HYDROMORPHONE HCL PF 1 MG/ML IJ SOLN
0.2500 mg | INTRAMUSCULAR | Status: DC | PRN
  Administered 2013-12-14 (×2): 0.5 mg via INTRAVENOUS

## 2013-12-14 MED ORDER — HYDROMORPHONE 0.3 MG/ML IV SOLN
INTRAVENOUS | Status: AC
  Filled 2013-12-14: qty 25

## 2013-12-14 MED ORDER — PROMETHAZINE HCL 25 MG/ML IJ SOLN: 6.2500 mg | INTRAMUSCULAR | Status: DC | PRN

## 2013-12-14 MED ORDER — OXYCODONE HCL 5 MG PO TABS: 5.0000 mg | ORAL_TABLET | Freq: Once | ORAL | Status: DC | PRN

## 2013-12-14 MED ORDER — BACITRACIN-NEOMYCIN-POLYMYXIN OINTMENT TUBE
TOPICAL_OINTMENT | CUTANEOUS | Status: DC | PRN
  Administered 2013-12-14: 1 via TOPICAL

## 2013-12-14 MED ORDER — FENTANYL CITRATE 0.05 MG/ML IJ SOLN
INTRAMUSCULAR | Status: AC
  Filled 2013-12-14: qty 5

## 2013-12-14 MED ORDER — 0.9 % SODIUM CHLORIDE (POUR BTL) OPTIME
TOPICAL | Status: DC | PRN
  Administered 2013-12-14: 1000 mL

## 2013-12-14 MED ORDER — LIDOCAINE HCL (CARDIAC) 20 MG/ML IV SOLN
INTRAVENOUS | Status: AC
  Filled 2013-12-14: qty 5

## 2013-12-14 MED ORDER — PROPOFOL 10 MG/ML IV BOLUS
INTRAVENOUS | Status: AC
  Filled 2013-12-14: qty 20

## 2013-12-14 MED ORDER — MIDAZOLAM HCL 2 MG/2ML IJ SOLN
INTRAMUSCULAR | Status: AC
  Filled 2013-12-14: qty 2

## 2013-12-14 MED ORDER — FENTANYL CITRATE 0.05 MG/ML IJ SOLN
25.0000 ug | INTRAMUSCULAR | Status: DC | PRN
  Administered 2013-12-14: 25 ug via INTRAVENOUS

## 2013-12-14 MED ORDER — LACTATED RINGERS IV SOLN
INTRAVENOUS | Status: DC | PRN
  Administered 2013-12-14 (×2): via INTRAVENOUS

## 2013-12-14 MED ORDER — LACTATED RINGERS IV SOLN
INTRAVENOUS | Status: DC
  Administered 2013-12-14: 1000 mL via INTRAVENOUS

## 2013-12-14 MED ORDER — CEFAZOLIN SODIUM-DEXTROSE 2-3 GM-% IV SOLR
INTRAVENOUS | Status: AC
  Filled 2013-12-14: qty 50

## 2013-12-14 MED ORDER — SODIUM CHLORIDE 0.9 % IR SOLN
Status: DC | PRN
  Administered 2013-12-14: 3000 mL

## 2013-12-14 MED ORDER — SODIUM CHLORIDE 0.9 % IR SOLN
Status: DC | PRN
  Administered 2013-12-14: 14:00:00

## 2013-12-14 MED ORDER — MEPERIDINE HCL 50 MG/ML IJ SOLN: 6.2500 mg | INTRAMUSCULAR | Status: DC | PRN

## 2013-12-14 MED ORDER — OXYCODONE HCL 5 MG/5ML PO SOLN
5.0000 mg | Freq: Once | ORAL | Status: DC | PRN
  Filled 2013-12-14: qty 5

## 2013-12-14 MED ORDER — ONDANSETRON HCL 4 MG/2ML IJ SOLN
INTRAMUSCULAR | Status: AC
  Filled 2013-12-14: qty 2

## 2013-12-14 MED ORDER — BUPIVACAINE-EPINEPHRINE (PF) 0.25% -1:200000 IJ SOLN
INTRAMUSCULAR | Status: AC
  Filled 2013-12-14: qty 30

## 2013-12-14 MED ORDER — PROPOFOL 10 MG/ML IV BOLUS
INTRAVENOUS | Status: DC | PRN
  Administered 2013-12-14: 170 mg via INTRAVENOUS

## 2013-12-14 MED ORDER — BUPIVACAINE-EPINEPHRINE 0.25% -1:200000 IJ SOLN
INTRAMUSCULAR | Status: DC | PRN
  Administered 2013-12-14: 20 mL

## 2013-12-14 SURGICAL SUPPLY — 27 items
BINDER BREAST XLRG (GAUZE/BANDAGES/DRESSINGS) ×3 IMPLANT
BLADE HEX COATED 2.75 (ELECTRODE) ×3 IMPLANT
CANISTER SUCTION 2500CC (MISCELLANEOUS) ×3 IMPLANT
COVER SURGICAL LIGHT HANDLE (MISCELLANEOUS) ×3 IMPLANT
DECANTER SPIKE VIAL GLASS SM (MISCELLANEOUS) IMPLANT
DRAPE LAPAROTOMY T 102X78X121 (DRAPES) IMPLANT
DRAPE UTILITY XL STRL (DRAPES) ×3 IMPLANT
DRSG PAD ABDOMINAL 8X10 ST (GAUZE/BANDAGES/DRESSINGS) ×3 IMPLANT
ELECT REM PT RETURN 9FT ADLT (ELECTROSURGICAL) ×3
ELECTRODE REM PT RTRN 9FT ADLT (ELECTROSURGICAL) ×1 IMPLANT
GAUZE SPONGE 4X4 12PLY STRL (GAUZE/BANDAGES/DRESSINGS) ×3 IMPLANT
GOWN STRL REUS W/TWL LRG LVL3 (GOWN DISPOSABLE) ×6 IMPLANT
HANDPIECE INTERPULSE COAX TIP (DISPOSABLE) ×2
KIT BASIN OR (CUSTOM PROCEDURE TRAY) ×3 IMPLANT
MATRIX SURGICAL PSM 7X10CM (Tissue) ×3 IMPLANT
MICROMATRIX 500MG (Tissue) ×3 IMPLANT
NEEDLE HYPO 22GX1.5 SAFETY (NEEDLE) IMPLANT
NS IRRIG 1000ML POUR BTL (IV SOLUTION) ×3 IMPLANT
PACK GENERAL/GYN (CUSTOM PROCEDURE TRAY) ×3 IMPLANT
SET HNDPC FAN SPRY TIP SCT (DISPOSABLE) ×1 IMPLANT
SOLUTION PARTIC MCRMTRX 500MG (Tissue) ×1 IMPLANT
SPONGE GAUZE 4X4 12PLY (GAUZE/BANDAGES/DRESSINGS) ×2 IMPLANT
STAPLER VISISTAT 35W (STAPLE) ×3 IMPLANT
SUT MON AB 5-0 PS2 18 (SUTURE) ×6 IMPLANT
SUT VIC AB 5-0 PS2 18 (SUTURE) ×3 IMPLANT
SYR CONTROL 10ML LL (SYRINGE) IMPLANT
TOWEL OR 17X26 10 PK STRL BLUE (TOWEL DISPOSABLE) ×3 IMPLANT

## 2013-12-14 NOTE — Op Note (Signed)
Operative Note   DATE OF OPERATION: 12/14/2013  LOCATION: [facility] OR outpatient  SURGICAL DIVISION: Plastic Surgery  PREOPERATIVE DIAGNOSES:  Left breast wound 7 x 5 x 1 cm   POSTOPERATIVE DIAGNOSES:  same  PROCEDURE:  Preparation of left breast wound for placement of Acell (500 mg powder and 7 x 10 cm sheet) with excision of skin and subcutaneous tissue. Primary closure after undermining  SURGEON: Wayland Denis, DO  ASSISTANT: Shawn Rayburn, PA  ANESTHESIA:  General.   COMPLICATIONS: None.   INDICATIONS FOR PROCEDURE:  The patient, [name] is a 43 y.o. female born on 1970-09-28, is here for treatment of a left breast wound. MRN: [id number]  CONSENT:  Informed consent was obtained directly from the patient. Risks, benefits and alternatives were fully discussed. Specific risks including but not limited to bleeding, infection, hematoma, seroma, scarring, pain, infection, contracture, asymmetry, wound healing problems, and need for further surgery were all discussed. The patient did have an ample opportunity to have questions answered to satisfaction.   DESCRIPTION OF PROCEDURE:  The patient was taken to the operating room. SCDs were placed and IV antibiotics were given. The patient's operative site was prepped and draped in a sterile fashion. A time out was performed and all information was confirmed to be correct.  General anesthesia was administered.  The #10 blade was used to excised the skin around the wound and base of the wound.  The bovie was used for hemostasis.  The area was irrigated with saline and antibiotic solution.  The decision was made to try to close the area.  1 cm of undermining was done to prepare for closure. The Acell powder and sheet were applied.  The skin was closed with vertical mattress sutures of 5-0 Monocryl.  The patient tolerated the procedure well.  There were no complications. The patient was allowed to wake from anesthesia, extubated and  taken to the recovery room in satisfactory condition.

## 2013-12-14 NOTE — Anesthesia Preprocedure Evaluation (Addendum)
Anesthesia Evaluation  Patient identified by MRN, date of birth, ID band Patient awake    Reviewed: Allergy & Precautions, H&P , NPO status , Patient's Chart, lab work & pertinent test results, reviewed documented beta blocker date and time   Airway Mallampati: II TM Distance: >3 FB Neck ROM: Full    Dental no notable dental hx. (+) Dental Advisory Given   Pulmonary shortness of breath, asthma ,  breath sounds clear to auscultation  Pulmonary exam normal       Cardiovascular hypertension, Pt. on medications and Pt. on home beta blockers +CHF Rhythm:Regular Rate:Normal  Echo 10/2013  - Left ventricle: The cavity size was normal. Wall thickness was  normal. Systolic function was severely reduced. The estimated  ejection fraction was in the range of 20% to 25%. Diffuse  hypokinesis. Features are consistent with a pseudonormal left  ventricular filling pattern, with concomitant abnormal relaxation and increased filling pressure (grade 2 diastolic dysfunction). - Left atrium: The atrium was mildly dilated. - Atrial septum: No defect or patent foramen ovale was identified.   Neuro/Psych  Headaches, PSYCHIATRIC DISORDERS Anxiety Depression Bipolar Disorder  Neuromuscular disease    GI/Hepatic GERD-  ,  Endo/Other  diabetes, Type 2, Oral Hypoglycemic Agents  Renal/GU      Musculoskeletal  (+) Fibromyalgia -  Abdominal Normal abdominal exam  (+)   Peds  Hematology  (+) anemia ,   Anesthesia Other Findings CHF- diffuse hypokinesia EF 25% on echo but 35% on Cath recently  Reproductive/Obstetrics                         Anesthesia Physical  Anesthesia Plan  ASA: IV  Anesthesia Plan: General   Post-op Pain Management:    Induction: Intravenous  Airway Management Planned: LMA  Additional Equipment:   Intra-op Plan:   Post-operative Plan: Extubation in OR  Informed Consent: I have reviewed the  patients History and Physical, chart, labs and discussed the procedure including the risks, benefits and alternatives for the proposed anesthesia with the patient or authorized representative who has indicated his/her understanding and acceptance.   Dental advisory given  Plan Discussed with: CRNA  Anesthesia Plan Comments:        Anesthesia Quick Evaluation

## 2013-12-14 NOTE — Brief Op Note (Signed)
12/14/2013  2:05 PM  PATIENT:  [name]  43 y.o. female  PRE-OPERATIVE DIAGNOSIS:  LEFT BREAST WOUND   POST-OPERATIVE DIAGNOSIS:  LEFT BREAST WOUND   PROCEDURE:  Procedure(s): IRRIGATION AND DEBRIDEMENT OF LEFT BREAST WOUND WITH PLACEMENT OF A-CELL, WITH CLOSURE  (Left)  SURGEON:  Surgeon(s) and Role:    * Larone Kliethermes Sanger, DO - Primary  PHYSICIAN ASSISTANT: none  ASSISTANTS: none   ANESTHESIA:   local and general  EBL:     BLOOD ADMINISTERED:none  DRAINS: none   LOCAL MEDICATIONS USED:  MARCAINE     SPECIMEN:  Source of Specimen:  left breast wound  DISPOSITION OF SPECIMEN:  PATHOLOGY  COUNTS:  YES  TOURNIQUET:  * No tourniquets in log *  DICTATION: .Dragon Dictation  PLAN OF CARE: Discharge to home after PACU  PATIENT DISPOSITION:  PACU - hemodynamically stable.   Delay start of Pharmacological VTE agent (>24hrs) due to surgical blood loss or risk of bleeding: no

## 2013-12-14 NOTE — Transfer of Care (Signed)
Immediate Anesthesia Transfer of Care Note  Patient: [name]  Procedure(s) Performed: Procedure(s): IRRIGATION AND DEBRIDEMENT OF LEFT BREAST WOUND WITH PLACEMENT OF A-CELL, WITH CLOSURE  (Left)  Patient Location: PACU  Anesthesia Type:General  Level of Consciousness: oriented and patient cooperative  Airway & Oxygen Therapy: Patient Spontanous Breathing and Patient connected to face mask oxygen  Post-op Assessment: Report given to PACU RN and Post -op Vital signs reviewed and stable  Post vital signs: Reviewed and stable  Complications: No apparent anesthesia complications

## 2013-12-14 NOTE — Anesthesia Postprocedure Evaluation (Signed)
Anesthesia Post Note  Patient: [name]  Procedure(s) Performed: Procedure(s) (LRB): IRRIGATION AND DEBRIDEMENT OF LEFT BREAST WOUND WITH PLACEMENT OF A-CELL, WITH CLOSURE  (Left)  Anesthesia type: General  Patient location: PACU  Post pain: Pain level controlled  Post assessment: Post-op Vital signs reviewed  Last Vitals: BP 125/77  Pulse 86  Temp(Src) 36.3 C (Oral)  Resp 17  Ht 5' 5.5" (1.664 m)  Wt 211 lb 6 oz (95.879 kg)  BMI 34.63 kg/m2  SpO2 100%  LMP 12/12/2013  Post vital signs: Reviewed  Level of consciousness: sedated  Complications: No apparent anesthesia complications

## 2013-12-14 NOTE — H&P (Signed)
[name] is an 43 y.o. female.   Chief Complaint: left breast wound HPI: The patient is a 43 yrs old bf here for treatment of her left breast wound.  She underwent I and D of a left breast abscess and now has a nonhealing wound.  The decision was made to bring her to the OR for treatment.   Past Medical History  Diagnosis Date  . Asthma   . GERD (gastroesophageal reflux disease)   . Diabetes mellitus   . Hyperlipidemia   . Migraines   . Hypertension   . Back pain, chronic   . Depression   . Neuropathy   . Anxiety   . Nervous breakdown sept 2013  . Fibromyalgia   . History of gastric bypass Jan 2013  . Insomnia   . Wound of left breast   . Nonischemic cardiomyopathy   . Systolic CHF, acute on chronic     CARDIOLOGIST-- DR Houston Siren NELSON  ([facility]ler)  . Shortness of breath     with exertion   . Bipolar disorder   . Arthritis     neck and shoulders   . Anemia     hx of iron transfusions - last one 10/2013     Past Surgical History  Procedure Laterality Date  . Back surgery  November 1992  . Cesarean section  December 1998  . Tubal ligation  January 1999  . Cholecystectomy  1998  . Gastric roux-en-y  06/30/2011    Procedure: LAPAROSCOPIC ROUX-EN-Y GASTRIC;  Surgeon: Edward Jolly, MD;  Location: [facility];  Service: General;  Laterality: N/A;     . Esophagogastroduodenoscopy  07/27/2011    Procedure: ESOPHAGOGASTRODUODENOSCOPY (EGD);  Surgeon: Landry Dyke, MD;  Location: [facility];  Service: Endoscopy;  Laterality: N/A;  . Irrigation and debridement abscess Left 11/26/2013    Procedure: IRRIGATION AND DEBRIDEMENT ABSCESS;  Surgeon: Earnstine Regal, MD;  Location: [facility];  Service: General;  Laterality: Left;  . Cardiac catheterization  11-14-2013  [facility]    NORMAL LV FILLING PRESSURE W/ MILD ELEVATED RV FILLING, POSSIBLY SUFFESTING RV FAILURE OUT OF PROPORTION TO LV FAILURE. EF ABOUT 35% WITH DIFFUSE HYPOKINESIS. NO ANGIOGRAPHIC CAD, NONISCHEMIC  CARDIOMYOPATHY    Family History  Problem Relation Age of Onset  . Diabetes Maternal Aunt   . Diabetes Paternal Aunt   . Diabetes Paternal Grandfather    Social History:  reports that she has never smoked. She has never used smokeless tobacco. She reports that she does not drink alcohol or use illicit drugs.  Allergies:  Allergies  Allergen Reactions  . Imitrex [Sumatriptan] Nausea Only    Can not take the pill but she take the injection    Medications Prior to Admission  Medication Sig Dispense Refill  . albuterol (PROVENTIL HFA;VENTOLIN HFA) 108 (90 BASE) MCG/ACT inhaler Inhale 2 puffs into the lungs every 6 (six) hours as needed. WHEEZING       . aspirin EC 81 MG tablet Take 1 tablet (81 mg total) by mouth daily.  90 tablet  3  . Biotin 1000 MCG tablet Take 1,000 mcg by mouth daily.      Marland Kitchen buPROPion (WELLBUTRIN XL) 300 MG 24 hr tablet Take 300 mg by mouth every morning.       . citalopram (CELEXA) 20 MG tablet Take 20 mg by mouth every morning.      . cyclobenzaprine (FLEXERIL) 10 MG tablet Take 10 mg by mouth 3 (three) times daily as  needed for muscle spasms.       . fluconazole (DIFLUCAN) 150 MG tablet Take 150 mg by mouth daily. If patient takes antibioitics will take before to prevent injection      . fluticasone (FLONASE) 50 MCG/ACT nasal spray Place 50 sprays into both nostrils daily.       . furosemide (LASIX) 40 MG tablet Take 40 mg by mouth 2 (two) times daily.      Marland Kitchen ibuprofen (ADVIL,MOTRIN) 800 MG tablet Take 800 mg by mouth every 8 (eight) hours as needed. As needed      . insulin glargine (LANTUS) 100 UNIT/ML injection Inject 74 Units into the skin daily. For control of blood sugar patient takes am      . lidocaine (LIDODERM) 5 % Place 1 patch onto the skin daily as needed (for back pain).       . lubiprostone (AMITIZA) 24 MCG capsule Take 1 capsule (24 mcg total) by mouth 2 (two) times daily with a meal.  30 capsule  1  . metolazone (ZAROXOLYN) 2.5 MG tablet Take 1  tablet (2.5 mg total) by mouth daily.  90 tablet  3  . nebivolol (BYSTOLIC) 2.5 MG tablet Take 2.5 mg by mouth every morning.      . olmesartan (BENICAR) 20 MG tablet Take 1 tablet (20 mg total) by mouth daily.  30 tablet  3  . oxyCODONE-acetaminophen (PERCOCET) 10-325 MG per tablet Take 0.5-1 tablets by mouth every 4 (four) hours as needed for pain.       . potassium chloride SA (KLOR-CON M20) 20 MEQ tablet Take 1 tablet (20 mEq total) by mouth daily.  90 tablet  3  . pregabalin (LYRICA) 150 MG capsule Take 150 mg by mouth 3 (three) times daily.      . ranitidine (ZANTAC) 150 MG tablet Take 150 mg by mouth daily.      Marland Kitchen tiZANidine (ZANAFLEX) 4 MG tablet Take 4 mg by mouth every 6 (six) hours as needed (for pain).      . VOLTAREN 1 % GEL Apply 2 g topically 4 (four) times daily.       . clindamycin (CLEOCIN) 300 MG capsule Take 1 capsule (300 mg total) by mouth 3 (three) times daily.  21 capsule  0    Results for orders placed during the hospital encounter of 12/14/13 (from the past 48 hour(s))  GLUCOSE, CAPILLARY     Status: Abnormal   Collection Time    12/14/13  9:10 AM      Result Value Ref Range   Glucose-Capillary 199 (*) 70 - 99 mg/dL   Comment 1 Notify RN    BASIC METABOLIC PANEL     Status: Abnormal   Collection Time    12/14/13 10:09 AM      Result Value Ref Range   Sodium 138  137 - 147 mEq/L   Potassium 4.4  3.7 - 5.3 mEq/L   Chloride 100  96 - 112 mEq/L   CO2 26  19 - 32 mEq/L   Glucose, Bld 198 (*) 70 - 99 mg/dL   BUN 38 (*) 6 - 23 mg/dL   Creatinine, Ser 1.27 (*) 0.50 - 1.10 mg/dL   Calcium 9.1  8.4 - 10.5 mg/dL   GFR calc non Af Amer 51 (*) >90 mL/min   GFR calc Af Amer 59 (*) >90 mL/min   Comment: (NOTE)     The eGFR has been calculated using the CKD EPI equation.  This calculation has not been validated in all clinical situations.     eGFR's persistently <90 mL/min signify possible Chronic Kidney     Disease.   Anion gap 12  5 - 15  HCG, SERUM, QUALITATIVE      Status: None   Collection Time    12/14/13 10:09 AM      Result Value Ref Range   Preg, Serum NEGATIVE  NEGATIVE   Comment:            THE SENSITIVITY OF THIS     METHODOLOGY IS >10 mIU/mL.   Dg Chest 2 View  12/14/2013   CLINICAL DATA:  Preop for incision and drainage of left breast wound, some shortness of breath and cough  EXAM: CHEST  2 VIEW  COMPARISON:  Chest x-ray of 10/01/2013  FINDINGS: No active infiltrate or effusion is seen. Mediastinal and hilar contours are unchanged. The heart is mildly enlarged and stable. No acute bony abnormality is seen.  IMPRESSION: No active cardiopulmonary disease.  Stable mild cardiomegaly.   Electronically Signed   By: Ivar Drape M.D.   On: 12/14/2013 09:46    Review of Systems  Constitutional: Negative.   HENT: Negative.   Eyes: Negative.   Respiratory: Negative.   Cardiovascular: Negative.   Gastrointestinal: Negative.   Musculoskeletal: Negative.   Skin: Negative.   Psychiatric/Behavioral: Negative.     Blood pressure 147/82, pulse 81, temperature 97.8 F (36.6 C), temperature source Oral, resp. rate 18, height 5' 5.5" (1.664 m), weight 95.879 kg (211 lb 6 oz), last menstrual period 12/12/2013, SpO2 100.00%. Physical Exam  Constitutional: She is oriented to person, place, and time. She appears well-developed and well-nourished.  HENT:  Head: Normocephalic and atraumatic.  Eyes: Conjunctivae and EOM are normal. Pupils are equal, round, and reactive to light.  Cardiovascular: Normal rate.   Respiratory: Effort normal.    GI: Soft.  Musculoskeletal: Normal range of motion.  Neurological: She is alert and oriented to person, place, and time.  Psychiatric: She has a normal mood and affect. Her behavior is normal. Thought content normal.     Assessment/Plan Plan for irrigation and debridement with Acell placement.  SANGER,Anushree Dorsi 12/14/2013, 2:04 PM

## 2013-12-14 NOTE — Discharge Instructions (Signed)
Apply triple antibiotic ointment daily Continue to wear the sports bra

## 2013-12-19 ENCOUNTER — Encounter (INDEPENDENT_AMBULATORY_CARE_PROVIDER_SITE_OTHER): Payer: Commercial Managed Care - HMO

## 2013-12-19 ENCOUNTER — Telehealth: Payer: Self-pay | Admitting: Cardiology

## 2013-12-19 MED ORDER — ALPRAZOLAM 0.25 MG PO TABS: 0.2500 mg | ORAL_TABLET | Freq: Two times a day (BID) | ORAL | Status: DC | PRN

## 2013-12-19 NOTE — Telephone Encounter (Signed)
New message     Talk to [facility].  She said you were supposed to ask Dr Delton See something and she wanted to talk to you about that

## 2013-12-19 NOTE — Telephone Encounter (Signed)
Contacted pt and told her that per Dr Delton See we can prescribe her Xanax 0.25mg   twice a day when necessary for 1 month only. Informed pt that I will call this into her pharmacy of choice as a call in.  Pt verbalized understanding and agrees with this plan.

## 2013-12-20 ENCOUNTER — Ambulatory Visit: Payer: Self-pay

## 2013-12-20 ENCOUNTER — Other Ambulatory Visit: Payer: Self-pay

## 2013-12-25 ENCOUNTER — Encounter (HOSPITAL_COMMUNITY): Payer: Self-pay

## 2013-12-25 ENCOUNTER — Telehealth (INDEPENDENT_AMBULATORY_CARE_PROVIDER_SITE_OTHER): Payer: Self-pay

## 2013-12-25 NOTE — Telephone Encounter (Signed)
LMOM. Per Dr Gerrit Friends pt should f/u with Dr Kelly Splinter.

## 2013-12-25 NOTE — Telephone Encounter (Signed)
Informed pt of message below. Pt verbalized understanding  

## 2013-12-26 DIAGNOSIS — S21009A Unspecified open wound of unspecified breast, initial encounter: Secondary | ICD-10-CM | POA: Insufficient documentation

## 2013-12-27 ENCOUNTER — Ambulatory Visit (HOSPITAL_BASED_OUTPATIENT_CLINIC_OR_DEPARTMENT_OTHER): Payer: Medicare HMO | Admitting: Internal Medicine

## 2013-12-27 ENCOUNTER — Other Ambulatory Visit (HOSPITAL_BASED_OUTPATIENT_CLINIC_OR_DEPARTMENT_OTHER): Payer: Commercial Managed Care - HMO

## 2013-12-27 ENCOUNTER — Telehealth: Payer: Self-pay | Admitting: Internal Medicine

## 2013-12-27 ENCOUNTER — Encounter: Payer: Self-pay | Admitting: Internal Medicine

## 2013-12-27 VITALS — BP 143/79 | HR 85 | Temp 98.3°F | Resp 18 | Ht 65.0 in | Wt 212.7 lb

## 2013-12-27 DIAGNOSIS — I509 Heart failure, unspecified: Secondary | ICD-10-CM | POA: Diagnosis not present

## 2013-12-27 DIAGNOSIS — Z862 Personal history of diseases of the blood and blood-forming organs and certain disorders involving the immune mechanism: Secondary | ICD-10-CM

## 2013-12-27 DIAGNOSIS — D500 Iron deficiency anemia secondary to blood loss (chronic): Secondary | ICD-10-CM

## 2013-12-27 DIAGNOSIS — N6009 Solitary cyst of unspecified breast: Secondary | ICD-10-CM

## 2013-12-27 DIAGNOSIS — Z9884 Bariatric surgery status: Secondary | ICD-10-CM

## 2013-12-27 DIAGNOSIS — N920 Excessive and frequent menstruation with regular cycle: Secondary | ICD-10-CM | POA: Diagnosis not present

## 2013-12-27 DIAGNOSIS — E119 Type 2 diabetes mellitus without complications: Secondary | ICD-10-CM

## 2013-12-27 LAB — IRON AND TIBC CHCC
%SAT: 24 % (ref 21–57)
IRON: 80 ug/dL (ref 41–142)
TIBC: 340 ug/dL (ref 236–444)
UIBC: 260 ug/dL (ref 120–384)

## 2013-12-27 LAB — COMPREHENSIVE METABOLIC PANEL (CC13)
ALT: 39 U/L (ref 0–55)
ANION GAP: 10 meq/L (ref 3–11)
AST: 40 U/L — ABNORMAL HIGH (ref 5–34)
Albumin: 3.4 g/dL — ABNORMAL LOW (ref 3.5–5.0)
Alkaline Phosphatase: 237 U/L — ABNORMAL HIGH (ref 40–150)
BILIRUBIN TOTAL: 0.36 mg/dL (ref 0.20–1.20)
BUN: 23.5 mg/dL (ref 7.0–26.0)
CHLORIDE: 104 meq/L (ref 98–109)
CO2: 25 meq/L (ref 22–29)
CREATININE: 1.2 mg/dL — AB (ref 0.6–1.1)
Calcium: 9.7 mg/dL (ref 8.4–10.4)
GLUCOSE: 220 mg/dL — AB (ref 70–140)
Potassium: 4.4 mEq/L (ref 3.5–5.1)
Sodium: 140 mEq/L (ref 136–145)
Total Protein: 7.8 g/dL (ref 6.4–8.3)

## 2013-12-27 LAB — CBC WITH DIFFERENTIAL/PLATELET
BASO%: 0.6 % (ref 0.0–2.0)
BASOS ABS: 0 10*3/uL (ref 0.0–0.1)
EOS ABS: 0.4 10*3/uL (ref 0.0–0.5)
EOS%: 4.8 % (ref 0.0–7.0)
HEMATOCRIT: 36.6 % (ref 34.8–46.6)
HEMOGLOBIN: 11.7 g/dL (ref 11.6–15.9)
LYMPH#: 2.2 10*3/uL (ref 0.9–3.3)
LYMPH%: 26.7 % (ref 14.0–49.7)
MCH: 26.3 pg (ref 25.1–34.0)
MCHC: 32 g/dL (ref 31.5–36.0)
MCV: 82.1 fL (ref 79.5–101.0)
MONO#: 0.5 10*3/uL (ref 0.1–0.9)
MONO%: 6.4 % (ref 0.0–14.0)
NEUT%: 61.5 % (ref 38.4–76.8)
NEUTROS ABS: 5.1 10*3/uL (ref 1.5–6.5)
PLATELETS: 275 10*3/uL (ref 145–400)
RBC: 4.46 10*6/uL (ref 3.70–5.45)
RDW: 26.5 % — ABNORMAL HIGH (ref 11.2–14.5)
WBC: 8.3 10*3/uL (ref 3.9–10.3)

## 2013-12-27 LAB — FERRITIN CHCC: Ferritin: 60 ng/ml (ref 9–269)

## 2013-12-27 NOTE — Progress Notes (Signed)
[name] OFFICE PROGRESS NOTE  Linda Sake, MD Dr. Cristela Blue [name] [name]  DIAGNOSIS: History of iron deficiency anemia - Plan: CBC with Differential, Ferritin, Iron and TIBC [facility], CBC with Differential, Ferritin, Iron and TIBC [facility], Comprehensive metabolic panel (Cmet) - [facility], Ambulatory referral to Gynecology  History of gastric bypass  Chief Complaint  Patient presents with  . History of iron deficiency anemia    CURRENT TREATMENT:  Feraheme 1,020 mg prn.   INTERVAL HISTORY: [name] 43 y.o. female with a history of IDA secondary heavy menses and also a history of gastric bypass is here for one week follow up.  She was seen on 10/12/2013.  Her hemoglobin was 7.9 and she received feraheme 1,020 mg.   She reports increased energy.  She is having difficulties sleeping related to her recent diagnosis of CHF.  She reports an extended family history of CHF. She was started on coreg and lasix.  Her ace inhibitor was discontinued due to cough.  She is now on benicar.   Of note, she presented [name] emergency room on 10/01/2013 with dyspnea and low energy. At that time, her hemoglobin was in the high 7s. She was recommended for hematology referral because the patient was taking her iron supplement as recommended for the past several months. She was taking ferrous sulfate 325 mg tid with orange juice per her PCP. At the emergency room her hemoglobin was 8.4, and she was not transfused. Her FOBT test was negative. Liver function tests were elevated (AST 136, ALT 66, Alk phos 203) and pro-BNP was high at 1711. CXR showed cardiomegaly, mild vascular congestion, small bilateral effusions, possibly early CHF. She is scheduled to see Dr. Athena [name] of [facility] tomorrow. Her lasix has increased from 40 mg daily to 80 mg daily due to lower extremity swelling. She was discharged with follow up with cardiology and  hematology. She saw Dr. Lisbeth [name] on 10/03/2013.   She reports having surgery on 12/14/2013 by Dr. Migdalia [name] for left breast infection/abscess. It started a small bump that increased.  She had preparation of left breast wound for placement of Acell (500 mg powder and 7 x 10 cm sheet) with excision of skin and subcutaneous tissue. Primary closure after undermining. She had a prior admission on 11/27/2013 for left breast abscess s/p I and D and excisional biopsy of skin and subcutaneous tissue  By Dr. Harlow [name] with pathology consistent with abscess. She is going back and forward to wound care and she reports draining.   She denies fevers. She reports improvement in her pain. She is on doxycycline 100 mg bid.    MEDICAL HISTORY: Past Medical History  Diagnosis Date  . Asthma   . GERD (gastroesophageal reflux disease)   . Diabetes mellitus   . Hyperlipidemia   . Migraines   . Hypertension   . Back pain, chronic   . Depression   . Neuropathy   . Anxiety   . Nervous breakdown sept 2013  . Fibromyalgia   . History of gastric bypass Jan 2013  . Insomnia   . Wound of left breast   . Nonischemic cardiomyopathy   . Systolic CHF, acute on chronic     CARDIOLOGIST-- DR [name]  ([facility])  . Shortness of breath     with exertion   . Bipolar disorder   . Arthritis     neck and shoulders   . Anemia  hx of iron transfusions - last one 10/2013     INTERIM HISTORY: has Morbid obesity; IDDM (insulin dependent diabetes mellitus); Degenerative disc disease; Nausea and vomiting; Major depressive disorder, recurrent episode, moderate; Compulsive behavior disorder; History of gastric bypass; Anxiety disorder; Nervous breakdown; Hematochezia; Anemia; DM type 2 (diabetes mellitus, type 2); Generalized weakness; Generalized abdominal pain; Hyperactive bowel sounds; Dehydration; Insomnia; Diabetic neuropathy; Migraine, unspecified, without mention of intractable migraine without mention of status migrainosus;  History of iron deficiency anemia; Acute on chronic systolic heart failure; and Left breast abscess on her problem list.    ALLERGIES:  is allergic to imitrex.  MEDICATIONS: has a current medication list which includes the following prescription(s): albuterol, alprazolam, aspirin ec, biotin, bupropion, citalopram, cyclobenzaprine, doxycycline, fluconazole, fluticasone, furosemide, ibuprofen, insulin glargine, lidocaine, lubiprostone, metolazone, nebivolol, olmesartan, oxycodone-acetaminophen, potassium chloride sa, pregabalin, ranitidine, tizanidine, and voltaren.  SURGICAL HISTORY:  Past Surgical History  Procedure Laterality Date  . Back surgery  November 1992  . Cesarean section  December 1998  . Tubal ligation  January 1999  . Cholecystectomy  1998  . Gastric roux-en-y  06/30/2011    Procedure: LAPAROSCOPIC ROUX-EN-Y GASTRIC;  Surgeon: Linda Jolly, MD;  Location: [facility];  Service: General;  Laterality: N/A;     . Esophagogastroduodenoscopy  07/27/2011    Procedure: ESOPHAGOGASTRODUODENOSCOPY (EGD);  Surgeon: Linda Dyke, MD;  Location: [facility];  Service: Endoscopy;  Laterality: N/A;  . Irrigation and debridement abscess Left 11/26/2013    Procedure: IRRIGATION AND DEBRIDEMENT ABSCESS;  Surgeon: Linda Regal, MD;  Location: [facility];  Service: General;  Laterality: Left;  . Cardiac catheterization  11-14-2013  DR [name]    NORMAL LV FILLING PRESSURE W/ MILD ELEVATED RV FILLING, POSSIBLY SUFFESTING RV FAILURE OUT OF PROPORTION TO LV FAILURE. EF ABOUT 35% WITH DIFFUSE HYPOKINESIS. NO ANGIOGRAPHIC CAD, NONISCHEMIC CARDIOMYOPATHY  . Incision and drainage of wound Left 12/14/2013    Procedure: IRRIGATION AND DEBRIDEMENT OF LEFT BREAST WOUND WITH PLACEMENT OF A-CELL, WITH CLOSURE ;  Surgeon: Linda Kos, DO;  Location: [facility];  Service: Plastics;  Laterality: Left;    REVIEW OF SYSTEMS:   Constitutional: Denies fevers, chills or abnormal weight loss; Positive  fatigue Eyes: Denies blurriness of vision Ears, nose, mouth, throat, and face: Denies mucositis or sore throat Respiratory: Denies cough, dyspnea or wheezes Cardiovascular: Denies palpitation, chest discomfort or lower extremity swelling Gastrointestinal:  Denies nausea, heartburn or change in bowel habits Skin: Denies abnormal skin rashes Lymphatics: Denies new lymphadenopathy or easy bruising Neurological:Denies numbness, tingling or new weaknesses Behavioral/Psych: Mood is stable, no new changes  All other systems were reviewed with the patient and are negative.  PHYSICAL EXAMINATION: ECOG PERFORMANCE STATUS: 0 - Asymptomatic  Blood pressure 143/79, pulse 85, temperature 98.3 F (36.8 C), temperature source Oral, resp. rate 18, height _0  (1.651 m), weight 212 lb 11.2 oz (96.48 kg), last menstrual period 12/12/2013.  GENERAL:alert, no distress and comfortable; moderately obese SKIN: skin color, texture, turgor are normal, no rashes or significant lesions; multiple scarred lesion on lower extremity.  EYES: normal, Conjunctiva are pink and non-injected, sclera clear OROPHARYNX:no exudate, no erythema and lips, buccal mucosa, and tongue normal  NECK: supple, thyroid normal size, non-tender, without nodularity LYMPH:  no palpable lymphadenopathy in the cervical, axillary or supraclavicular BREASTS: No examined.  LUNGS: clear to auscultation with normal breathing effort, no wheezes or rhonchi HEART: regular rate & rhythm and no murmurs and no lower extremity edema ABDOMEN:abdomen soft, non-tender and  normal bowel sounds Musculoskeletal:no cyanosis of digits and no clubbing  NEURO: alert & oriented x 3 with fluent speech, no focal motor/sensory deficits  Labs:  Lab Results  Component Value Date   WBC 8.3 12/27/2013   HGB 11.7 12/27/2013   HCT 36.6 12/27/2013   MCV 82.1 12/27/2013   PLT 275 12/27/2013   NEUTROABS 5.1 12/27/2013      Chemistry      Component Value Date/Time   NA  140 12/27/2013 0902   NA 138 12/14/2013 1009   NA 141 04/12/2013 1026   K 4.4 12/27/2013 0902   K 4.4 12/14/2013 1009   CL 100 12/14/2013 1009   CO2 25 12/27/2013 0902   CO2 26 12/14/2013 1009   BUN 23.5 12/27/2013 0902   BUN 38* 12/14/2013 1009   BUN 12 04/12/2013 1026   CREATININE 1.2* 12/27/2013 0902   CREATININE 1.27* 12/14/2013 1009   CREATININE 0.76 03/20/2011 1302      Component Value Date/Time   CALCIUM 9.7 12/27/2013 0902   CALCIUM 9.1 12/14/2013 1009   ALKPHOS 237* 12/27/2013 0902   ALKPHOS 187* 11/02/2013 1649   AST 40* 12/27/2013 0902   AST 38* 11/02/2013 1649   ALT 39 12/27/2013 0902   ALT 47* 11/02/2013 1649   BILITOT 0.36 12/27/2013 0902   BILITOT 0.2 11/02/2013 1649        CBC:  Recent Labs Lab 12/27/13 0902  WBC 8.3  NEUTROABS 5.1  HGB 11.7  HCT 36.6  MCV 82.1  PLT 275    Anemia work up No results found for this basename: VITAMINB12, FOLATE, FERRITIN, TIBC, IRON, RETICCTPCT,  in the last 72 hours  Studies:  No results found.   RADIOGRAPHIC STUDIES: None   ASSESSMENT: [name] 43 y.o. female with a history of History of iron deficiency anemia - Plan: CBC with Differential, Ferritin, Iron and TIBC [facility], CBC with Differential, Ferritin, Iron and TIBC [facility], Comprehensive metabolic panel (Cmet) - [facility], Ambulatory referral to Gynecology  History of gastric bypass   PLAN:  1.  Left breast abscess s/p surgery with repair. --She reports decreased pain and improved healing.  She is following with surgery next Tuesday.  She is on doxycline 100 mg bid.   2.  H.o. severe IDA secondary to #2.  --Previously, her iron indices demonstrated iron level of 16 and ferritin of 9 and an elevated UIBC of 425. Her hemoglobin is 7.9 with a low MCV of 65.2. She has reticulocyte percent is 1.47. She has two factor contributing to iron deficiency secondary to menorrhagia and/or s/p gastric bypass surgery. GI workup including FOBT was negative. We gave her feraheme 1,020 mg intravenously   On 04/30 and now her hemoglobin is 11.7. We will repeat labs monthly and follow ferritin, iron studies. We will await her iron studies and ferritin from today.   3. Menorrhagia.  -- Referral to Gynecology Dr. Matthew Saras made but patient reports not being able to make appointment due to #1.  We will re-refer her to their office. Marland Kitchen She will likely require an ultrasound to rule of fibroids or polyps.   4. Early CHF.  --Patient is following with [facility].   5. DM2 -- Continue management per PCP.  Tight glucose control in the setting of #1.   6. Follow up.  --She will follow up with CBC and iron studies monthly with a symptom visit in 2 months.   All questions were answered. The patient knows to call the clinic with  any problems, questions or concerns. We can certainly see the patient much sooner if necessary.  I spent 15 minutes counseling the patient face to face. The total time spent in the appointment was 25 minutes.    Dilan Novosad, MD 12/27/2013 10:06 AM

## 2013-12-27 NOTE — Telephone Encounter (Signed)
Pt confirmed labs/ov and cld for referral @Dr . Holland's office for apt. Gave pt AVS and wrote down apt w/Dr. Marcelle Overlie .Marland KitchenMarland KitchenMarland KitchenMarland KitchenKJ

## 2014-01-08 ENCOUNTER — Encounter (HOSPITAL_COMMUNITY): Payer: Medicare HMO

## 2014-01-17 ENCOUNTER — Ambulatory Visit (HOSPITAL_COMMUNITY): Payer: Medicare HMO | Attending: Cardiology

## 2014-01-17 DIAGNOSIS — IMO0001 Reserved for inherently not codable concepts without codable children: Secondary | ICD-10-CM

## 2014-01-17 DIAGNOSIS — I509 Heart failure, unspecified: Secondary | ICD-10-CM | POA: Diagnosis present

## 2014-01-17 DIAGNOSIS — E119 Type 2 diabetes mellitus without complications: Secondary | ICD-10-CM

## 2014-01-17 DIAGNOSIS — Z794 Long term (current) use of insulin: Secondary | ICD-10-CM

## 2014-01-17 DIAGNOSIS — I5023 Acute on chronic systolic (congestive) heart failure: Secondary | ICD-10-CM

## 2014-01-19 DIAGNOSIS — I5023 Acute on chronic systolic (congestive) heart failure: Secondary | ICD-10-CM

## 2014-01-23 ENCOUNTER — Telehealth: Payer: Self-pay | Admitting: Cardiology

## 2014-01-23 ENCOUNTER — Other Ambulatory Visit: Payer: Self-pay | Admitting: Cardiology

## 2014-01-23 NOTE — Telephone Encounter (Signed)
°  Patient wants to ask you a question, she wouldn't give me the information. Please call and advise.

## 2014-01-24 NOTE — Telephone Encounter (Signed)
lmtcb

## 2014-01-24 NOTE — Telephone Encounter (Signed)
Pt calling to ask Dr Delton See if she would refill her Xanax one more time, because she is not due to see PCP until Sept 11, 2015.  Pt states she really needs this due to panic attacks.  Informed pt that Dr Delton See is out of the office today, but I will send her this message to review and recommend and follow-up thereafter.  Pt verbalized understanding and agrees with this plan.

## 2014-01-24 NOTE — Telephone Encounter (Signed)
Called pt, no answer. No vm set up.

## 2014-01-25 ENCOUNTER — Ambulatory Visit (HOSPITAL_COMMUNITY): Payer: Commercial Managed Care - HMO

## 2014-01-25 NOTE — Telephone Encounter (Signed)
We can only give her 0.25 mg - total of 10 pills. No refills, thank you

## 2014-01-25 NOTE — Telephone Encounter (Signed)
Follow up ° ° ° ° ° °Returning Ivey's call °

## 2014-01-25 NOTE — Telephone Encounter (Signed)
Rx called in to pharmacy. 

## 2014-01-29 ENCOUNTER — Ambulatory Visit (HOSPITAL_COMMUNITY): Payer: Commercial Managed Care - HMO

## 2014-01-31 ENCOUNTER — Ambulatory Visit (HOSPITAL_COMMUNITY): Payer: Commercial Managed Care - HMO

## 2014-01-31 ENCOUNTER — Telehealth (HOSPITAL_COMMUNITY): Payer: Self-pay | Admitting: *Deleted

## 2014-01-31 NOTE — Telephone Encounter (Signed)
Message copied by Chelsea Aus on Wed Jan 31, 2014  5:28 PM ------      Message from: Lars Masson      Created: Wed Jan 31, 2014  4:57 PM      Regarding: RE: Cardiac rehab referral form       Target HR would be < 130 BPM            ----- Message -----         From: Chelsea Aus, RN         Sent: 01/31/2014   4:40 PM           To: Lars Masson, MD, Loa Socks, LPN      Subject: Cardiac rehab referral form                                    Pt scheduled to attend orientation for cardiac rehab on 8/20.  We are holding the start of  exercise pending your recommendations for intensity.  We received a signed order from 6/22 but the intensity was not checked which indicates target heart rate for exercise.   I will re fax this to you for your indication. I understand you are in the office on tomorrow 8/20 pm.            Thanks for your help      Carlete       ------

## 2014-02-01 ENCOUNTER — Telehealth (HOSPITAL_COMMUNITY): Payer: Self-pay | Admitting: *Deleted

## 2014-02-01 ENCOUNTER — Ambulatory Visit (HOSPITAL_COMMUNITY): Payer: Commercial Managed Care - HMO

## 2014-02-01 NOTE — Telephone Encounter (Signed)
Pt no show or call for scheduled orientation appointment.  Pt returned call from message left.  Pt rescheduled for Sept 3rd for orientation due to conflicting appt with Dr. Delton See for next Thursday at 9:30.  Pt states she is having a spinal stimulator placed on 8/24 and would not be able to drive. Pt feels she can attend orientation on 9/3.

## 2014-02-02 ENCOUNTER — Ambulatory Visit (HOSPITAL_COMMUNITY): Payer: Commercial Managed Care - HMO

## 2014-02-05 ENCOUNTER — Other Ambulatory Visit: Payer: Self-pay | Admitting: *Deleted

## 2014-02-05 ENCOUNTER — Encounter: Payer: Self-pay | Admitting: *Deleted

## 2014-02-05 ENCOUNTER — Ambulatory Visit (HOSPITAL_COMMUNITY): Payer: Commercial Managed Care - HMO

## 2014-02-07 ENCOUNTER — Ambulatory Visit (HOSPITAL_COMMUNITY): Payer: Commercial Managed Care - HMO

## 2014-02-08 ENCOUNTER — Ambulatory Visit: Payer: Self-pay | Admitting: Cardiology

## 2014-02-09 ENCOUNTER — Ambulatory Visit (HOSPITAL_COMMUNITY): Payer: Commercial Managed Care - HMO

## 2014-02-12 ENCOUNTER — Ambulatory Visit (HOSPITAL_COMMUNITY): Payer: Commercial Managed Care - HMO

## 2014-02-13 NOTE — H&P (Signed)
Bernadene Person  DICTATION # [id number] CSN# [id number]   Meriel Pica, MD 02/13/2014 10:30 AM

## 2014-02-13 NOTE — H&P (Signed)
[name], [name]              ACCOUNT NO.:  [id number]  MEDICAL RECORD NO.:  [id number]  LOCATION:                                 FACILITY:  PHYSICIAN:  [name], M.D.DATE OF BIRTH:  [birth year]  DATE OF ADMISSION: DATE OF DISCHARGE:                             HISTORY & PHYSICAL   CHIEF COMPLAINT:  Menorrhagia with anemia.  HISTORY OF PRESENT ILLNESS:  A 43 year old, G2, P1, prior tubal, not currently sexually active was referred by Dr. Rosie [name] for evaluation of menorrhagia and anemia.  She has had an iron transfusion by her hematologist.  She has also had a gastric bypass in the past.  At the low point, her hemoglobin was 7.9.  In May of this year, hemoglobin was 11.7.  FHT was done in our office on January 27, 2014, and that demonstrated two very small less than 1 cm fibroids, adnexa negative, no free fluid.  The cavity looked unremarkable.  We discussed with her a number of options to help manage her menorrhagia including OCPs, Mirena IUD, hysterectomy, endometrial ablation.  She prefers to proceed with hysteroscopy, D and C, and endometrial ablation.  This procedure including the 90% hypermenorrhea rate, other specific risks related to bleeding, infection, or the need for further surgery discussed with her which she understands and accepts.  PAST SURGICAL HISTORY:  She has had one C-section, tubal ligation, one pregnancy termination, gastric bypass in 2013, cholecystectomy, and has had back surgery.  CURRENT MEDICATIONS:  Albuterol, BuSpar, citalopram, Flonase, Lasix, insulin glargine, metolazone, oxycodone-acetaminophen p.r.n., Voltaren p.r.n.  SOCIAL HISTORY:  Denies alcohol, tobacco, or drug use.  REVIEW OF SYSTEMS:  Significant for obesity, IDDM, major depressive disorder, history of anxiety, migraine headache.  FAMILY HISTORY:  Significant for heart disease, asthma, and diabetes.  PHYSICAL EXAMINATION:  VITAL SIGNS:  Temp 98.2, blood pressure  110/78. HEENT:  Unremarkable. NECK:  Supple without masses. LUNGS:  Clear. CARDIOVASCULAR:  Regular rate and rhythm without murmurs, rubs, or gallops. BREASTS:  Without masses. ABDOMEN:  Soft, flat, nontender. PELVIC:  Vulva, vagina, cervix normal.  Pap May 2015 was normal.  Uterus mid position, normal size.  Adnexa negative.  IMPRESSION:  Menorrhagia, history of anemia.  PLAN:  D and C, hysteroscopy with NovaSure endometrial ablation. Procedure and risks reviewed as above.     Felipe Paluch M. Marcelle [name], M.D.     [facility]/MEDQ  D:  02/13/2014  T:  02/13/2014  Job:  [id number]

## 2014-02-14 ENCOUNTER — Ambulatory Visit (HOSPITAL_COMMUNITY): Payer: Commercial Managed Care - HMO

## 2014-02-15 ENCOUNTER — Telehealth: Payer: Self-pay | Admitting: Cardiology

## 2014-02-15 ENCOUNTER — Ambulatory Visit (HOSPITAL_COMMUNITY): Payer: Commercial Managed Care - HMO

## 2014-02-15 NOTE — Telephone Encounter (Signed)
New message ° ° ° ° ° ° ° ° ° ° °Pt would like for Ivy to give her a call / pt did not disclose any other info °

## 2014-02-15 NOTE — Telephone Encounter (Signed)
Pt wanted to let Dr Delton See know she will be having surgery Sander Radon sure) on 9/14 at [facility] and she will go under anesthesia.  This procedure will address her gynecological issues, Dr Marcelle Overlie [facility], will be doing this. Pt is also having another surgery on 9/25 at [facility], where she will be getting a spinal cord stimulator in her back by Dr Ollen Bowl.  Pt just wanted to inform Dr Delton See, incase she needs to further advise.  Informed pt that I will route this message to Dr Delton See for her review.  Pt verbalized understanding and agrees with this plan.

## 2014-02-16 ENCOUNTER — Telehealth: Payer: Self-pay

## 2014-02-16 ENCOUNTER — Ambulatory Visit (HOSPITAL_COMMUNITY): Payer: Commercial Managed Care - HMO

## 2014-02-16 ENCOUNTER — Encounter: Payer: Self-pay | Admitting: Cardiology

## 2014-02-16 NOTE — Telephone Encounter (Signed)
Letter signed by Dr.Nelson and mailed to pt

## 2014-02-21 ENCOUNTER — Ambulatory Visit (HOSPITAL_COMMUNITY): Payer: Commercial Managed Care - HMO

## 2014-02-21 ENCOUNTER — Telehealth: Payer: Self-pay | Admitting: Cardiology

## 2014-02-21 NOTE — Telephone Encounter (Signed)
Received request from Nurse fax box, documents faxed for surgical clearance. To: [facility] Fax number: [phone] Attention: [phone]/km

## 2014-02-21 NOTE — Patient Instructions (Addendum)
   Your procedure is scheduled on:  Monday, Sept 14  Enter through the Main Entrance of [facility] at:  1125 AM Pick up the phone at the desk and dial [phone] and inform us of your arrival.  Please call this number if you have any problems the morning of surgery: [phone]  Remember: Do not eat food after midnight: Sunday Do not drink clear liquids after: 830 AM Monday, day of surgery Take these medicines the morning of surgery with a SIP OF WATER: Xanax if needed, Wellbutrin, Zaroxolyn, Bystolic, Benicar, Zantac, Lyrica, Celexa *Do not take insulin morning of surgery *Bring asthma inhaler day of surgery *Stop taking ASA, Ibuprofen, Multivitamins/Herbal supplements  Do not wear jewelry, make-up, or FINGER nail polish No metal in your hair or on your body. Do not wear lotions, powders, perfumes.  You may wear deodorant.  Do not bring valuables to the hospital. Contacts, dentures or bridgework may not be worn into surgery.  Patients discharged on the day of surgery will not be allowed to drive home.

## 2014-02-22 ENCOUNTER — Encounter (HOSPITAL_COMMUNITY): Payer: Self-pay

## 2014-02-22 ENCOUNTER — Other Ambulatory Visit: Payer: Self-pay | Admitting: Cardiology

## 2014-02-22 ENCOUNTER — Encounter (HOSPITAL_COMMUNITY)
Admission: RE | Admit: 2014-02-22 | Discharge: 2014-02-22 | Disposition: A | Discharge status: Home / Self Care | Payer: Commercial Managed Care - HMO | Source: Ambulatory Visit | Attending: Obstetrics and Gynecology | Admitting: Obstetrics and Gynecology

## 2014-02-22 DIAGNOSIS — N925 Other specified irregular menstruation: Secondary | ICD-10-CM | POA: Diagnosis not present

## 2014-02-22 DIAGNOSIS — N938 Other specified abnormal uterine and vaginal bleeding: Secondary | ICD-10-CM | POA: Insufficient documentation

## 2014-02-22 DIAGNOSIS — N949 Unspecified condition associated with female genital organs and menstrual cycle: Secondary | ICD-10-CM | POA: Insufficient documentation

## 2014-02-22 DIAGNOSIS — D500 Iron deficiency anemia secondary to blood loss (chronic): Secondary | ICD-10-CM | POA: Insufficient documentation

## 2014-02-22 DIAGNOSIS — Z01812 Encounter for preprocedural laboratory examination: Secondary | ICD-10-CM | POA: Diagnosis present

## 2014-02-22 HISTORY — DX: Carpal tunnel syndrome, bilateral upper limbs: G56.03

## 2014-02-22 HISTORY — DX: Peripheral vascular disease, unspecified: I73.9

## 2014-02-22 LAB — CBC
HCT: 34.8 % — ABNORMAL LOW (ref 36.0–46.0)
Hemoglobin: 11.5 g/dL — ABNORMAL LOW (ref 12.0–15.0)
MCH: 29.5 pg (ref 26.0–34.0)
MCHC: 33 g/dL (ref 30.0–36.0)
MCV: 89.2 fL (ref 78.0–100.0)
PLATELETS: 256 10*3/uL (ref 150–400)
RBC: 3.9 MIL/uL (ref 3.87–5.11)
RDW: 13.8 % (ref 11.5–15.5)
WBC: 7.9 10*3/uL (ref 4.0–10.5)

## 2014-02-22 LAB — BASIC METABOLIC PANEL
Anion gap: 20 — ABNORMAL HIGH (ref 5–15)
BUN: 14 mg/dL (ref 6–23)
CO2: 16 meq/L — AB (ref 19–32)
CREATININE: 0.87 mg/dL (ref 0.50–1.10)
Calcium: 9.2 mg/dL (ref 8.4–10.5)
Chloride: 103 mEq/L (ref 96–112)
GFR calc Af Amer: >90 mL/min (ref >90)
GFR calc non Af Amer: 81 mL/min — ABNORMAL LOW (ref >90)
GLUCOSE: 188 mg/dL — AB (ref 70–99)
Potassium: 5.1 mEq/L (ref 3.7–5.3)
Sodium: 139 mEq/L (ref 137–147)

## 2014-02-23 ENCOUNTER — Other Ambulatory Visit: Payer: Self-pay | Admitting: Anesthesiology

## 2014-02-23 ENCOUNTER — Ambulatory Visit (HOSPITAL_COMMUNITY): Payer: Commercial Managed Care - HMO

## 2014-02-26 ENCOUNTER — Ambulatory Visit (HOSPITAL_COMMUNITY): Payer: Commercial Managed Care - HMO

## 2014-02-26 ENCOUNTER — Ambulatory Visit (HOSPITAL_COMMUNITY): Payer: Medicare HMO | Admitting: Certified Registered Nurse Anesthetist

## 2014-02-26 ENCOUNTER — Encounter (HOSPITAL_COMMUNITY): Payer: Self-pay | Admitting: Anesthesiology

## 2014-02-26 ENCOUNTER — Ambulatory Visit (HOSPITAL_COMMUNITY)
Admission: RE | Admit: 2014-02-26 | Discharge: 2014-02-26 | Disposition: A | Discharge status: Home / Self Care | Payer: Medicare HMO | Source: Ambulatory Visit | Attending: Obstetrics and Gynecology | Admitting: Obstetrics and Gynecology

## 2014-02-26 ENCOUNTER — Encounter (HOSPITAL_COMMUNITY): Payer: Medicare HMO | Admitting: Certified Registered Nurse Anesthetist

## 2014-02-26 ENCOUNTER — Encounter (HOSPITAL_COMMUNITY)
Admission: RE | Disposition: A | Discharge status: Home / Self Care | Payer: Self-pay | Source: Ambulatory Visit | Attending: Obstetrics and Gynecology

## 2014-02-26 DIAGNOSIS — K219 Gastro-esophageal reflux disease without esophagitis: Secondary | ICD-10-CM | POA: Diagnosis not present

## 2014-02-26 DIAGNOSIS — N920 Excessive and frequent menstruation with regular cycle: Secondary | ICD-10-CM | POA: Diagnosis not present

## 2014-02-26 DIAGNOSIS — I10 Essential (primary) hypertension: Secondary | ICD-10-CM | POA: Diagnosis not present

## 2014-02-26 DIAGNOSIS — I509 Heart failure, unspecified: Secondary | ICD-10-CM | POA: Insufficient documentation

## 2014-02-26 DIAGNOSIS — E119 Type 2 diabetes mellitus without complications: Secondary | ICD-10-CM | POA: Insufficient documentation

## 2014-02-26 DIAGNOSIS — D649 Anemia, unspecified: Secondary | ICD-10-CM | POA: Diagnosis not present

## 2014-02-26 DIAGNOSIS — J45909 Unspecified asthma, uncomplicated: Secondary | ICD-10-CM | POA: Insufficient documentation

## 2014-02-26 DIAGNOSIS — IMO0001 Reserved for inherently not codable concepts without codable children: Secondary | ICD-10-CM | POA: Insufficient documentation

## 2014-02-26 DIAGNOSIS — Z9884 Bariatric surgery status: Secondary | ICD-10-CM | POA: Insufficient documentation

## 2014-02-26 HISTORY — PX: DILITATION & CURRETTAGE/HYSTROSCOPY WITH NOVASURE ABLATION: SHX5568

## 2014-02-26 LAB — GLUCOSE, CAPILLARY
GLUCOSE-CAPILLARY: 98 mg/dL (ref 70–99)
Glucose-Capillary: 153 mg/dL — ABNORMAL HIGH (ref 70–99)

## 2014-02-26 LAB — PREGNANCY, URINE: Preg Test, Ur: NEGATIVE

## 2014-02-26 SURGERY — DILATATION & CURETTAGE/HYSTEROSCOPY WITH NOVASURE ABLATION
Anesthesia: General

## 2014-02-26 MED ORDER — DEXTROSE 5 % IV SOLN
2.0000 g | INTRAVENOUS | Status: AC
  Administered 2014-02-26: 2 g via INTRAVENOUS
  Filled 2014-02-26: qty 2

## 2014-02-26 MED ORDER — FENTANYL CITRATE 0.05 MG/ML IJ SOLN
INTRAMUSCULAR | Status: DC | PRN
  Administered 2014-02-26: 25 ug via INTRAVENOUS
  Administered 2014-02-26: 50 ug via INTRAVENOUS

## 2014-02-26 MED ORDER — LACTATED RINGERS IV SOLN
INTRAVENOUS | Status: DC
  Administered 2014-02-26: 13:00:00 via INTRAVENOUS

## 2014-02-26 MED ORDER — MIDAZOLAM HCL 2 MG/2ML IJ SOLN
INTRAMUSCULAR | Status: AC
  Filled 2014-02-26: qty 2

## 2014-02-26 MED ORDER — PROPOFOL 10 MG/ML IV BOLUS
INTRAVENOUS | Status: DC | PRN
  Administered 2014-02-26: 150 mg via INTRAVENOUS

## 2014-02-26 MED ORDER — LIDOCAINE HCL 1 % IJ SOLN
INTRAMUSCULAR | Status: AC
  Filled 2014-02-26: qty 20

## 2014-02-26 MED ORDER — LIDOCAINE HCL (CARDIAC) 20 MG/ML IV SOLN
INTRAVENOUS | Status: AC
  Filled 2014-02-26: qty 5

## 2014-02-26 MED ORDER — PHENYLEPHRINE 40 MCG/ML (10ML) SYRINGE FOR IV PUSH (FOR BLOOD PRESSURE SUPPORT)
PREFILLED_SYRINGE | INTRAVENOUS | Status: AC
  Filled 2014-02-26: qty 5

## 2014-02-26 MED ORDER — SCOPOLAMINE 1 MG/3DAYS TD PT72
1.0000 | MEDICATED_PATCH | Freq: Once | TRANSDERMAL | Status: DC
  Administered 2014-02-26: 1.5 mg via TRANSDERMAL

## 2014-02-26 MED ORDER — DEXAMETHASONE SODIUM PHOSPHATE 10 MG/ML IJ SOLN
INTRAMUSCULAR | Status: DC | PRN
  Administered 2014-02-26: 4 mg via INTRAVENOUS

## 2014-02-26 MED ORDER — METOCLOPRAMIDE HCL 5 MG/ML IJ SOLN: 10.0000 mg | Freq: Once | INTRAMUSCULAR | Status: DC | PRN

## 2014-02-26 MED ORDER — DEXAMETHASONE SODIUM PHOSPHATE 4 MG/ML IJ SOLN
INTRAMUSCULAR | Status: AC
  Filled 2014-02-26: qty 1

## 2014-02-26 MED ORDER — LIDOCAINE HCL 1 % IJ SOLN
INTRAMUSCULAR | Status: DC | PRN
  Administered 2014-02-26: 7 mL

## 2014-02-26 MED ORDER — FENTANYL CITRATE 0.05 MG/ML IJ SOLN
INTRAMUSCULAR | Status: AC
  Filled 2014-02-26: qty 2

## 2014-02-26 MED ORDER — ONDANSETRON HCL 4 MG/2ML IJ SOLN
INTRAMUSCULAR | Status: DC | PRN
  Administered 2014-02-26: 4 mg via INTRAVENOUS

## 2014-02-26 MED ORDER — LIDOCAINE HCL (CARDIAC) 20 MG/ML IV SOLN
INTRAVENOUS | Status: DC | PRN
  Administered 2014-02-26: 70 mg via INTRAVENOUS
  Administered 2014-02-26: 30 mg via INTRAVENOUS

## 2014-02-26 MED ORDER — SCOPOLAMINE 1 MG/3DAYS TD PT72
MEDICATED_PATCH | TRANSDERMAL | Status: AC
  Administered 2014-02-26: 1.5 mg via TRANSDERMAL
  Filled 2014-02-26: qty 1

## 2014-02-26 MED ORDER — FENTANYL CITRATE 0.05 MG/ML IJ SOLN
25.0000 ug | INTRAMUSCULAR | Status: DC | PRN
  Administered 2014-02-26: 50 ug via INTRAVENOUS

## 2014-02-26 MED ORDER — MEPERIDINE HCL 25 MG/ML IJ SOLN: 6.2500 mg | INTRAMUSCULAR | Status: DC | PRN

## 2014-02-26 MED ORDER — PROPOFOL 10 MG/ML IV EMUL
INTRAVENOUS | Status: AC
  Filled 2014-02-26: qty 20

## 2014-02-26 MED ORDER — MIDAZOLAM HCL 2 MG/2ML IJ SOLN
INTRAMUSCULAR | Status: DC | PRN
  Administered 2014-02-26: 0.5 mg via INTRAVENOUS

## 2014-02-26 MED ORDER — PHENYLEPHRINE HCL 10 MG/ML IJ SOLN
INTRAMUSCULAR | Status: DC | PRN
  Administered 2014-02-26: 80 ug via INTRAVENOUS
  Administered 2014-02-26 (×3): 40 ug via INTRAVENOUS

## 2014-02-26 MED ORDER — ONDANSETRON HCL 4 MG/2ML IJ SOLN
INTRAMUSCULAR | Status: AC
  Filled 2014-02-26: qty 2

## 2014-02-26 MED ORDER — KETOROLAC TROMETHAMINE 30 MG/ML IJ SOLN
INTRAMUSCULAR | Status: AC
  Filled 2014-02-26: qty 1

## 2014-02-26 MED ORDER — EPHEDRINE SULFATE 50 MG/ML IJ SOLN
INTRAMUSCULAR | Status: DC | PRN
  Administered 2014-02-26 (×2): 10 mg via INTRAVENOUS

## 2014-02-26 MED ORDER — EPHEDRINE 5 MG/ML INJ
INTRAVENOUS | Status: AC
  Filled 2014-02-26: qty 10

## 2014-02-26 MED ORDER — KETOROLAC TROMETHAMINE 30 MG/ML IJ SOLN
INTRAMUSCULAR | Status: DC | PRN
  Administered 2014-02-26: 30 mg via INTRAVENOUS

## 2014-02-26 SURGICAL SUPPLY — 12 items
ABLATOR ENDOMETRIAL BIPOLAR (ABLATOR) ×3 IMPLANT
CLOTH BEACON ORANGE TIMEOUT ST (SAFETY) ×3 IMPLANT
CONTAINER PREFILL 10% NBF 60ML (FORM) ×6 IMPLANT
DRAPE HYSTEROSCOPY (DRAPE) ×3 IMPLANT
GLOVE BIO SURGEON STRL SZ7 (GLOVE) ×3 IMPLANT
GOWN STRL REUS W/TWL LRG LVL3 (GOWN DISPOSABLE) ×9 IMPLANT
PACK VAGINAL MINOR WOMEN LF (CUSTOM PROCEDURE TRAY) ×3 IMPLANT
PAD OB MATERNITY 4.3X12.25 (PERSONAL CARE ITEMS) ×3 IMPLANT
SET TUBING HYSTEROSCOPY 2 NDL (TUBING) IMPLANT
TOWEL OR 17X24 6PK STRL BLUE (TOWEL DISPOSABLE) ×6 IMPLANT
TUBE HYSTEROSCOPY W Y-CONNECT (TUBING) IMPLANT
WATER STERILE IRR 1000ML POUR (IV SOLUTION) ×3 IMPLANT

## 2014-02-26 NOTE — Op Note (Signed)
Preoperative diagnosis: Abnormal uterine bleeding with anemia  Postoperative diagnosis: Same  Procedure: Hysteroscopy with D&C, NovaSure endometrial ablation  Surgeon: Marcelle Overlie  Anesthesia: Gen.  EBL: Less than 50 cc  Specimens removed: Endometrial curettings, to pathology  Procedure and findings:  Patient taken the operating room after an adequate level of general anesthesia was obtained the legs in stirrups the perineum and vagina prepped and draped in usual fashion for hysteroscopy. The bladder was drained, EUA carried out the uterus was midposition, mobile, normal size, adnexa negative. Appropriate timeout taken at that point. Speculum was positioned cervix grasped with tenaculum, paracervical block was then created by infiltrating at 3 and 9:00 submucosally, 5-7 cc 1% plain Xylocaine at each site after negative aspiration. The uterus was sounded 8.5 cm progressively dilated to a 27-29 Pratt dilator, continuous flow hysteroscopy was carried out confirming the sonohysterography findings 7 normal cavity minimal endometrial buildup, sharp curettage carried out revealing minimal tissue, sent to pathology. Cervical length 2.5 NovaSure device was positioned with a width of 3.5 passing the CO2 testing with normal treatment cycle. This was well-tolerated she received IV Toradol at the end of the procedure went to recovery room in good condition.  Dictated with [facility]  Jimy Gates M. Milana Obey.D.

## 2014-02-26 NOTE — Discharge Instructions (Signed)

## 2014-02-26 NOTE — Progress Notes (Signed)
The patient was re-examined with no change in status 

## 2014-02-26 NOTE — Anesthesia Postprocedure Evaluation (Signed)
  Anesthesia Post-op Note  Patient: [name]  Procedure(s) Performed: Procedure(s): DILATATION & CURETTAGE/HYSTEROSCOPY WITH NOVASURE ABLATION (N/A)  Patient Location: PACU  Anesthesia Type:General  Level of Consciousness: awake, alert  and oriented  Airway and Oxygen Therapy: Patient Spontanous Breathing  Post-op Pain: none  Post-op Assessment: Post-op Vital signs reviewed, Patient's Cardiovascular Status Stable, Respiratory Function Stable, Patent Airway, No signs of Nausea or vomiting and Pain level controlled  Post-op Vital Signs: Reviewed and stable  Last Vitals:  Filed Vitals:   02/26/14 1415  BP: 125/84  Pulse: 87  Temp:   Resp: 16    Complications: No apparent anesthesia complications

## 2014-02-26 NOTE — OR Nursing (Signed)
Verified surgery end time with Circulating RN Karmen Stabs.  Surgery end time at 1435.

## 2014-02-26 NOTE — Anesthesia Preprocedure Evaluation (Signed)
Anesthesia Evaluation  Patient identified by MRN, date of birth, ID band Patient awake    Reviewed: Allergy & Precautions, H&P , NPO status , Patient's Chart, lab work & pertinent test results, reviewed documented beta blocker date and time   Airway Mallampati: II TM Distance: >3 FB Neck ROM: Full    Dental no notable dental hx. (+) Dental Advisory Given   Pulmonary shortness of breath, asthma ,  breath sounds clear to auscultation  Pulmonary exam normal       Cardiovascular hypertension, Pt. on medications and Pt. on home beta blockers +CHF Rhythm:Regular Rate:Normal  Echo 10/2013  - Left ventricle: The cavity size was normal. Wall thickness was  normal. Systolic function was severely reduced. The estimated  ejection fraction was in the range of 20% to 25%. Diffuse  hypokinesis. Features are consistent with a pseudonormal left  ventricular filling pattern, with concomitant abnormal relaxation and increased filling pressure (grade 2 diastolic dysfunction). - Left atrium: The atrium was mildly dilated. - Atrial septum: No defect or patent foramen ovale was identified.   Neuro/Psych  Headaches, PSYCHIATRIC DISORDERS  Neuromuscular disease    GI/Hepatic GERD-  ,  Endo/Other  diabetes, Type 2, Oral Hypoglycemic Agents  Renal/GU      Musculoskeletal  (+) Fibromyalgia -  Abdominal Normal abdominal exam  (+)   Peds  Hematology  (+) anemia ,   Anesthesia Other Findings   Nonischemic cardiomyopathy        Systolic CHF, acute on chronic   CARDIOLOGIST-- DR Aris Lot NELSON  ([place] CHF- diffuse hypokinesia EF 25% on echo but 35% on Cath. Some increased tiredness ; lungs clear of rales today   Asthma      GERD (gastroesophageal reflux disease)        Diabetes mellitus   BS 153 this day... Took no DM Meds   Hyperlipidemia        Migraines      Hypertension        Back pain        History of gastric bypass   Bipolar disorder        Peripheral vascular disease          Reproductive/Obstetrics                           Anesthesia Physical  Anesthesia Plan  ASA: III  Anesthesia Plan: General   Post-op Pain Management:    Induction: Intravenous  Airway Management Planned: LMA  Additional Equipment:   Intra-op Plan:   Post-operative Plan: Extubation in OR  Informed Consent: I have reviewed the patients History and Physical, chart, labs and discussed the procedure including the risks, benefits and alternatives for the proposed anesthesia with the patient or authorized representative who has indicated his/her understanding and acceptance.   Dental Advisory Given and Dental advisory given  Plan Discussed with: CRNA and Surgeon  Anesthesia Plan Comments: (Discussed GA with LMA with RMoore CRNA, we'll follow the same plan as from July 15.  , possible sore throat, potential need to switch to ETT, N/V, pulmonary aspiration. Questions answered. )        Anesthesia Quick Evaluation

## 2014-02-26 NOTE — Transfer of Care (Signed)
Immediate Anesthesia Transfer of Care Note  Patient: [name]  Procedure(s) Performed: Procedure(s): DILATATION & CURETTAGE/HYSTEROSCOPY WITH NOVASURE ABLATION (N/A)  Patient Location: PACU  Anesthesia Type:General  Level of Consciousness: awake, alert , oriented and patient cooperative  Airway & Oxygen Therapy: Patient Spontanous Breathing and Patient connected to nasal cannula oxygen  Post-op Assessment: Report given to PACU RN and Post -op Vital signs reviewed and stable  Post vital signs: Reviewed and stable  Complications: No apparent anesthesia complications

## 2014-02-27 ENCOUNTER — Other Ambulatory Visit: Payer: Medicare HMO

## 2014-02-27 ENCOUNTER — Encounter (HOSPITAL_COMMUNITY): Payer: Self-pay | Admitting: Pharmacy Technician

## 2014-02-27 ENCOUNTER — Ambulatory Visit: Payer: Medicare HMO

## 2014-02-27 ENCOUNTER — Encounter (HOSPITAL_COMMUNITY): Payer: Self-pay | Admitting: Obstetrics and Gynecology

## 2014-02-28 ENCOUNTER — Other Ambulatory Visit: Payer: Self-pay | Admitting: Cardiology

## 2014-02-28 ENCOUNTER — Ambulatory Visit (HOSPITAL_COMMUNITY): Payer: Commercial Managed Care - HMO

## 2014-03-01 ENCOUNTER — Ambulatory Visit: Payer: Commercial Managed Care - HMO | Admitting: Cardiology

## 2014-03-02 ENCOUNTER — Ambulatory Visit (HOSPITAL_COMMUNITY): Payer: Commercial Managed Care - HMO

## 2014-03-05 ENCOUNTER — Ambulatory Visit (HOSPITAL_COMMUNITY): Payer: Commercial Managed Care - HMO

## 2014-03-07 ENCOUNTER — Ambulatory Visit (HOSPITAL_COMMUNITY): Payer: Commercial Managed Care - HMO

## 2014-03-08 ENCOUNTER — Encounter: Payer: Self-pay | Admitting: Cardiology

## 2014-03-08 ENCOUNTER — Ambulatory Visit: Payer: Commercial Managed Care - HMO | Admitting: Cardiology

## 2014-03-08 ENCOUNTER — Encounter (HOSPITAL_COMMUNITY)
Admission: RE | Admit: 2014-03-08 | Discharge: 2014-03-08 | Disposition: A | Discharge status: Home / Self Care | Payer: Medicare HMO | Source: Ambulatory Visit | Attending: Anesthesiology | Admitting: Anesthesiology

## 2014-03-08 ENCOUNTER — Encounter (HOSPITAL_COMMUNITY): Payer: Self-pay

## 2014-03-08 ENCOUNTER — Ambulatory Visit (INDEPENDENT_AMBULATORY_CARE_PROVIDER_SITE_OTHER): Payer: Commercial Managed Care - HMO | Admitting: Cardiology

## 2014-03-08 VITALS — BP 124/78 | HR 90 | Ht 65.0 in | Wt 216.0 lb

## 2014-03-08 DIAGNOSIS — M961 Postlaminectomy syndrome, not elsewhere classified: Secondary | ICD-10-CM | POA: Diagnosis not present

## 2014-03-08 DIAGNOSIS — Z7982 Long term (current) use of aspirin: Secondary | ICD-10-CM | POA: Diagnosis not present

## 2014-03-08 DIAGNOSIS — G5600 Carpal tunnel syndrome, unspecified upper limb: Secondary | ICD-10-CM | POA: Diagnosis not present

## 2014-03-08 DIAGNOSIS — I428 Other cardiomyopathies: Secondary | ICD-10-CM | POA: Diagnosis not present

## 2014-03-08 DIAGNOSIS — F329 Major depressive disorder, single episode, unspecified: Secondary | ICD-10-CM | POA: Diagnosis not present

## 2014-03-08 DIAGNOSIS — D649 Anemia, unspecified: Secondary | ICD-10-CM | POA: Diagnosis not present

## 2014-03-08 DIAGNOSIS — F319 Bipolar disorder, unspecified: Secondary | ICD-10-CM | POA: Diagnosis not present

## 2014-03-08 DIAGNOSIS — K219 Gastro-esophageal reflux disease without esophagitis: Secondary | ICD-10-CM | POA: Diagnosis not present

## 2014-03-08 DIAGNOSIS — I10 Essential (primary) hypertension: Secondary | ICD-10-CM | POA: Diagnosis not present

## 2014-03-08 DIAGNOSIS — E119 Type 2 diabetes mellitus without complications: Secondary | ICD-10-CM | POA: Diagnosis not present

## 2014-03-08 DIAGNOSIS — Z79899 Other long term (current) drug therapy: Secondary | ICD-10-CM | POA: Diagnosis not present

## 2014-03-08 DIAGNOSIS — IMO0001 Reserved for inherently not codable concepts without codable children: Secondary | ICD-10-CM | POA: Diagnosis not present

## 2014-03-08 DIAGNOSIS — F3289 Other specified depressive episodes: Secondary | ICD-10-CM | POA: Diagnosis not present

## 2014-03-08 DIAGNOSIS — F411 Generalized anxiety disorder: Secondary | ICD-10-CM | POA: Diagnosis not present

## 2014-03-08 DIAGNOSIS — I509 Heart failure, unspecified: Secondary | ICD-10-CM | POA: Diagnosis not present

## 2014-03-08 DIAGNOSIS — I5023 Acute on chronic systolic (congestive) heart failure: Secondary | ICD-10-CM

## 2014-03-08 DIAGNOSIS — Z794 Long term (current) use of insulin: Secondary | ICD-10-CM | POA: Diagnosis not present

## 2014-03-08 DIAGNOSIS — Z888 Allergy status to other drugs, medicaments and biological substances status: Secondary | ICD-10-CM | POA: Diagnosis not present

## 2014-03-08 DIAGNOSIS — J45909 Unspecified asthma, uncomplicated: Secondary | ICD-10-CM | POA: Diagnosis not present

## 2014-03-08 DIAGNOSIS — I739 Peripheral vascular disease, unspecified: Secondary | ICD-10-CM | POA: Diagnosis not present

## 2014-03-08 DIAGNOSIS — E785 Hyperlipidemia, unspecified: Secondary | ICD-10-CM | POA: Diagnosis not present

## 2014-03-08 LAB — COMPREHENSIVE METABOLIC PANEL
ALT: 55 U/L — ABNORMAL HIGH (ref 0–35)
AST: 87 U/L — ABNORMAL HIGH (ref 0–37)
Albumin: 3.4 g/dL — ABNORMAL LOW (ref 3.5–5.2)
Alkaline Phosphatase: 197 U/L — ABNORMAL HIGH (ref 39–117)
Anion gap: 15 (ref 5–15)
BUN: 33 mg/dL — ABNORMAL HIGH (ref 6–23)
CO2: 23 mEq/L (ref 19–32)
Calcium: 9 mg/dL (ref 8.4–10.5)
Chloride: 101 mEq/L (ref 96–112)
Creatinine, Ser: 0.9 mg/dL (ref 0.50–1.10)
GFR calc Af Amer: >90 mL/min (ref >90)
GFR calc non Af Amer: 78 mL/min — ABNORMAL LOW (ref >90)
Glucose, Bld: 249 mg/dL — ABNORMAL HIGH (ref 70–99)
Potassium: 5.1 mEq/L (ref 3.7–5.3)
Sodium: 139 mEq/L (ref 137–147)
Total Bilirubin: 0.3 mg/dL (ref 0.3–1.2)
Total Protein: 7.4 g/dL (ref 6.0–8.3)

## 2014-03-08 LAB — CBC
HCT: 34.5 % — ABNORMAL LOW (ref 36.0–46.0)
Hemoglobin: 11.6 g/dL — ABNORMAL LOW (ref 12.0–15.0)
MCH: 29.5 pg (ref 26.0–34.0)
MCHC: 33.6 g/dL (ref 30.0–36.0)
MCV: 87.8 fL (ref 78.0–100.0)
Platelets: 282 10*3/uL (ref 150–400)
RBC: 3.93 MIL/uL (ref 3.87–5.11)
RDW: 13.5 % (ref 11.5–15.5)
WBC: 9.5 10*3/uL (ref 4.0–10.5)

## 2014-03-08 LAB — SURGICAL PCR SCREEN
MRSA, PCR: NEGATIVE
Staphylococcus aureus: NEGATIVE

## 2014-03-08 LAB — HCG, SERUM, QUALITATIVE: Preg, Serum: NEGATIVE

## 2014-03-08 MED ORDER — CEFAZOLIN SODIUM-DEXTROSE 2-3 GM-% IV SOLR
2.0000 g | INTRAVENOUS | Status: DC
  Filled 2014-03-08: qty 50

## 2014-03-08 MED ORDER — CEFAZOLIN SODIUM-DEXTROSE 2-3 GM-% IV SOLR: 2.0000 g | INTRAVENOUS | Status: DC

## 2014-03-08 NOTE — Progress Notes (Signed)
Anesthesia Chart Review:  Pt is 43 year old female scheduled for spinal cord stimulator placement on 03/09/14 with Dr. Maryjean Ka.   PMH: HTN, DM, nonischemic cardiomyopathy, CHF, PVD, hyperlipidemia, fibromyalgia. Hx gastric bypass.   Medications include: ASA, lasix, nebivolol, olmestartan, metolazone, insulin, albuterol, KCl, lyrica, zantac.   Pt underwent general anesthesia 02/26/14 for D&C/hysteroscopy and on 12/14/13 for irrigation and debridement of L breast wound, both with no apparent anesthesia complications.   Preoperative labs reviewed.  Glucose 249. BUN 33. AST 87, ALT 55 (specimen hemolyzed). Alk phos 197.   Chest x-ray 12/14/13 reviewed. No active cardiopulmonary disease. Stable mild cardiomegaly.  EKG 11/14/13: NSR, LVH, Prolonged QT   Echocardiogram - 11/01/13  Left ventricle: The cavity size was normal. Wall thickness was normal. Systolic function was severely reduced. The estimated ejection fraction was in the range of 20% to 25%. Diffuse hypokinesis. Features are consistent with a pseudonormal left ventricular filling pattern, with concomitant abnormal relaxation and increased filling pressure (grade 2 diastolic dysfunction). - Left atrium: The atrium was mildly dilated. - Atrial septum: No defect or patent foramen ovale was identified. - Right ventricle: The cavity size was normal. Wall thickness was normal. Systolic function was normal.    Lexiscan nuclear stress test 11/01/13:  Overall Impression: Normal stress nuclear perfusion study. Findings are most compatible with nonischemic cardiomyopathy.  LV Ejection Fraction: 26%. LV Wall Motion: Global LV hypokinesis.   Right and left cath: 11/14/2013  Final Conclusions: Normal LV filling pressure with mildly elevated RV filling pressure, possibly suggesting RV failure out of proportion to LV failure. EF about 35% with diffuse hypokinesis. No angiographic CAD, nonischemic cardiomyopathy.   Cardiopulmonary testing 01/19/2014   Exercise testing with gas exchange demonstrates a mild-to-moderate functional limitation when compared to matched sedentary norms. The limitation is multifactorial in nature. The primary limitation is related to the patient's obesity and related restrictive lung physiology. However, there is also a mild circulatory limitation with a blunted BP response to exercise and chronotropic incompetence.   Dr. Meda Coffee, cardiologist, notes from 03/08/14 office visit with pt indicate Dr. Meda Coffee is aware of pt's impending surgery.   Given pt has tolerated general anesthesia twice in last 3 months and cardiologist is aware of surgery tomorrow, I anticipate pt can proceed with procedure as planned.   Willeen Cass, FNP-BC [facility]/Anesthesiology Phone: [phone] 03/08/2014 4:00 PM

## 2014-03-08 NOTE — Progress Notes (Signed)
Left voicemail for [facility] at Dr. Ollen Bowl  Office nurse to send the clearance record/note from Dr. Delton See, cardiac.

## 2014-03-08 NOTE — Patient Instructions (Signed)
Your physician recommends that you continue on your current medications as directed. Please refer to the Current Medication list given to you today.    Your physician recommends that you schedule a follow-up appointment in: with Dr Delton See in one month   Continue to follow-up with your psychiatrist on a medication regimen that's therapeutic for depression/anxiety

## 2014-03-08 NOTE — Pre-Procedure Instructions (Signed)
[name]  03/08/2014   Your procedure is scheduled on:  03/09/2014  Report to [facility] Admitting    ENTRANCE A    at 7:45 AM.  Call this number if you have problems the morning of surgery: [phone]   Remember:   Do not eat food or drink liquids after midnight.  TONIGHT   Take these medicines the morning of surgery with A SIP OF WATER: alprazolam, wellbutrin, Celexa, nasal spray, flexeril, bystolic, Lyrica , Ranitidine   Do not wear jewelry, make-up or nail polish.  Do not wear lotions, powders, or perfumes. You may wear deodorant.  Do not shave 48 hours prior to surgery.  Do not bring valuables to the hospital.  [facility] is not responsible for any belongings or valuables.               Contacts, dentures or bridgework may not be worn into surgery.  Leave suitcase in the car. After surgery it may be brought to your room.  For patients admitted to the hospital, discharge time is determined by your                treatment team.               Patients discharged the day of surgery will not be allowed to drive  home.  Name and phone number of your driver: /w family  Special Instructions: Special Instructions: [place] - Preparing for Surgery  Before surgery, you can play an important role.  Because skin is not sterile, your skin needs to be as free of germs as possible.  You can reduce the number of germs on you skin by washing with CHG (chlorahexidine gluconate) soap before surgery.  CHG is an antiseptic cleaner which kills germs and bonds with the skin to continue killing germs even after washing.  Please DO NOT use if you have an allergy to CHG or antibacterial soaps.  If your skin becomes reddened/irritated stop using the CHG and inform your nurse when you arrive at Short Stay.  Do not shave (including legs and underarms) for at least 48 hours prior to the first CHG shower.  You may shave your face.  Please follow these instructions carefully:   1.   Shower with CHG Soap the night before surgery and the  morning of Surgery.  2.  If you choose to wash your hair, wash your hair first as usual with your  normal shampoo.  3.  After you shampoo, rinse your hair and body thoroughly to remove the  Shampoo.  4.  Use CHG as you would any other liquid soap.  You can apply chg directly to the skin and wash gently with scrungie or a clean washcloth.  5.  Apply the CHG Soap to your body ONLY FROM THE NECK DOWN.    Do not use on open wounds or open sores.  Avoid contact with your eyes, ears, mouth and genitals (private parts).  Wash genitals (private parts)   with your normal soap.  6.  Wash thoroughly, paying special attention to the area where your surgery will be performed.  7.  Thoroughly rinse your body with warm water from the neck down.  8.  DO NOT shower/wash with your normal soap after using and rinsing off   the CHG Soap.  9.  Pat yourself dry with a clean towel.            10.  Wear clean  pajamas.            11.  Place clean sheets on your bed the night of your first shower and do not sleep with pets.  Day of Surgery  Do not apply any lotions/deodorants the morning of surgery.  Please wear clean clothes to the hospital/surgery center.   Please read over the following fact sheets that you were given: Pain Booklet, Coughing and Deep Breathing, MRSA Information and Surgical Site Infection Prevention
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
## 2014-03-08 NOTE — Progress Notes (Signed)
Call to [facility] consult. Reviewed pt. History briefly.

## 2014-03-08 NOTE — H&P (Signed)
[name] is an 43 y.o. female.   Chief Complaint: low back pain with radiation into the legs, peripheral neuropathic pain HPI: patient with a complicated past medical history, including diabetes from which she suffe; she has a long-standing history of lumbar back pain and radiculopathy. She has considered surgical intervention is not the best candidate for major surgery.  She has identified lumbar spondylosis, degenerative disc disease.  She has failed interventional treatment, medication management, andphysical therapy.  She was interested in pursuing other modalities.  From a medical and psychological perspective was considered to be a good candidate for spinal cord stimulator therapy.   She has undergone a trial of spinal cord stimulator therapy, with a better than 50% reduction in her pain. She is scheduled for permanent implant.  Past Medical History  Diagnosis Date  . Asthma   . GERD (gastroesophageal reflux disease)   . Diabetes mellitus   . Hyperlipidemia   . Migraines   . Hypertension   . Back pain, chronic   . Depression   . Neuropathy   . Anxiety   . Nervous breakdown sept 2013  . Fibromyalgia   . History of gastric bypass Jan 2013  . Insomnia   . Wound of left breast   . Nonischemic cardiomyopathy   . Systolic CHF, acute on chronic     CARDIOLOGIST-- DR [place] Siren NELSON  (Velora Heckler)  . Shortness of breath     with exertion   . Bipolar disorder   . Arthritis     neck and shoulders   . Anemia     hx of iron transfusions - last one 10/2013   . Peripheral vascular disease   . Carpal tunnel syndrome on both sides     Past Surgical History  Procedure Laterality Date  . Back surgery  November 1992  . Cesarean section  December 1998  . Tubal ligation  January 1999  . Cholecystectomy  1998  . Gastric roux-en-y  06/30/2011    Procedure: LAPAROSCOPIC ROUX-EN-Y GASTRIC;  Surgeon: Edward Jolly, MD;  Location: [facility];  Service: General;  Laterality: N/A;     .  Esophagogastroduodenoscopy  07/27/2011    Procedure: ESOPHAGOGASTRODUODENOSCOPY (EGD);  Surgeon: Landry Dyke, MD;  Location: [facility];  Service: Endoscopy;  Laterality: N/A;  . Irrigation and debridement abscess Left 11/26/2013    Procedure: IRRIGATION AND DEBRIDEMENT ABSCESS;  Surgeon: Earnstine Regal, MD;  Location: [facility];  Service: General;  Laterality: Left;  . Cardiac catheterization  11-14-2013  DR [facility]    NORMAL LV FILLING PRESSURE W/ MILD ELEVATED RV FILLING, POSSIBLY SUFFESTING RV FAILURE OUT OF PROPORTION TO LV FAILURE. EF ABOUT 35% WITH DIFFUSE HYPOKINESIS. NO ANGIOGRAPHIC CAD, NONISCHEMIC CARDIOMYOPATHY  . Incision and drainage of wound Left 12/14/2013    Procedure: IRRIGATION AND DEBRIDEMENT OF LEFT BREAST WOUND WITH PLACEMENT OF A-CELL, WITH CLOSURE ;  Surgeon: Theodoro Kos, DO;  Location: [facility];  Service: Plastics;  Laterality: Left;  . Dilitation & currettage/hystroscopy with novasure ablation N/A 02/26/2014    Procedure: DILATATION & CURETTAGE/HYSTEROSCOPY WITH NOVASURE ABLATION;  Surgeon: Margarette Asal, MD;  Location: [facility];  Service: Gynecology;  Laterality: N/A;  . Breast surgery Left     I&D for abcsess     Family History  Problem Relation Age of Onset  . Diabetes Maternal Aunt   . Diabetes Paternal Aunt   . Diabetes Paternal Grandfather    Social History:  reports that she has never smoked. She  has never used smokeless tobacco. She reports that she does not drink alcohol or use illicit drugs.  Allergies:  Allergies  Allergen Reactions  . Imitrex [Sumatriptan] Other (See Comments)    Acid reflux    Medications Prior to Admission  Medication Sig Dispense Refill  . albuterol (PROVENTIL HFA;VENTOLIN HFA) 108 (90 BASE) MCG/ACT inhaler Inhale 2 puffs into the lungs every 6 (six) hours as needed for wheezing.       Marland Kitchen ALPRAZolam (XANAX) 0.25 MG tablet Take 1 tablet (0.25 mg total) by mouth 2 (two) times daily as needed for anxiety.  60 tablet  0  .  aspirin EC 81 MG tablet Take 1 tablet (81 mg total) by mouth daily.  90 tablet  3  . Biotin 5000 MCG TABS Take 5,000 mcg by mouth daily.      Marland Kitchen buPROPion (WELLBUTRIN XL) 300 MG 24 hr tablet Take 300 mg by mouth daily before breakfast.       . citalopram (CELEXA) 40 MG tablet Take 40 mg by mouth daily before breakfast.       . cyclobenzaprine (FLEXERIL) 10 MG tablet Take 10 mg by mouth 3 (three) times daily as needed for muscle spasms.       . fluticasone (FLONASE) 50 MCG/ACT nasal spray Place 1 spray into both nostrils daily.       . furosemide (LASIX) 40 MG tablet Take 40 mg by mouth 2 (two) times daily.      Marland Kitchen ibuprofen (ADVIL,MOTRIN) 800 MG tablet Take 800 mg by mouth every 8 (eight) hours as needed (pain).       . Insulin Glargine (LANTUS SOLOSTAR) 100 UNIT/ML Solostar Pen Inject 74 Units into the skin daily.      . insulin glulisine (APIDRA) 100 UNIT/ML injection Inject 5 Units into the skin 3 (three) times daily before meals.      . lidocaine (LIDODERM) 5 % Place 1 patch onto the skin daily as needed (for back pain).       . lubiprostone (AMITIZA) 24 MCG capsule Take 24 mcg by mouth 2 (two) times daily as needed for constipation.      . metolazone (ZAROXOLYN) 2.5 MG tablet Take 1 tablet (2.5 mg total) by mouth daily.  90 tablet  3  . nebivolol (BYSTOLIC) 2.5 MG tablet Take 2.5 mg by mouth daily before breakfast.       . olmesartan (BENICAR) 20 MG tablet Take 20 mg by mouth daily.      Marland Kitchen oxyCODONE-acetaminophen (PERCOCET) 10-325 MG per tablet Take 1 tablet by mouth 4 (four) times daily as needed for pain.       . potassium chloride SA (KLOR-CON M20) 20 MEQ tablet Take 1 tablet (20 mEq total) by mouth daily.  90 tablet  3  . pregabalin (LYRICA) 150 MG capsule Take 150 mg by mouth 3 (three) times daily.      . ranitidine (ZANTAC) 150 MG tablet Take 150 mg by mouth daily.      Marland Kitchen tiZANidine (ZANAFLEX) 4 MG tablet Take 4 mg by mouth at bedtime as needed for muscle spasms.        Results for  orders placed during the hospital encounter of 03/09/14 (from the past 48 hour(s))  HCG, SERUM, QUALITATIVE     Status: None   Collection Time    03/08/14 12:31 PM      Result Value Ref Range   Preg, Serum NEGATIVE  NEGATIVE   Comment:  THE SENSITIVITY OF THIS     METHODOLOGY IS >10 mIU/mL.  COMPREHENSIVE METABOLIC PANEL     Status: Abnormal   Collection Time    03/08/14 12:31 PM      Result Value Ref Range   Sodium 139  137 - 147 mEq/L   Potassium 5.1  3.7 - 5.3 mEq/L   Comment: HEMOLYZED SPECIMEN, RESULTS MAY BE AFFECTED   Chloride 101  96 - 112 mEq/L   CO2 23  19 - 32 mEq/L   Glucose, Bld 249 (*) 70 - 99 mg/dL   BUN 33 (*) 6 - 23 mg/dL   Creatinine, Ser 0.90  0.50 - 1.10 mg/dL   Calcium 9.0  8.4 - 10.5 mg/dL   Total Protein 7.4  6.0 - 8.3 g/dL   Albumin 3.4 (*) 3.5 - 5.2 g/dL   AST 87 (*) 0 - 37 U/L   Comment: HEMOLYZED SPECIMEN, RESULTS MAY BE AFFECTED   ALT 55 (*) 0 - 35 U/L   Comment: HEMOLYZED SPECIMEN, RESULTS MAY BE AFFECTED   Alkaline Phosphatase 197 (*) 39 - 117 U/L   Total Bilirubin 0.3  0.3 - 1.2 mg/dL   GFR calc non Af Amer 78 (*) >90 mL/min   GFR calc Af Amer >90  >90 mL/min   Comment: (NOTE)     The eGFR has been calculated using the CKD EPI equation.     This calculation has not been validated in all clinical situations.     eGFR's persistently <90 mL/min signify possible Chronic Kidney     Disease.   Anion gap 15  5 - 15  CBC     Status: Abnormal   Collection Time    03/08/14 12:31 PM      Result Value Ref Range   WBC 9.5  4.0 - 10.5 K/uL   RBC 3.93  3.87 - 5.11 MIL/uL   Hemoglobin 11.6 (*) 12.0 - 15.0 g/dL   HCT 34.5 (*) 36.0 - 46.0 %   MCV 87.8  78.0 - 100.0 fL   MCH 29.5  26.0 - 34.0 pg   MCHC 33.6  30.0 - 36.0 g/dL   RDW 13.5  11.5 - 15.5 %   Platelets 282  150 - 400 K/uL  GLUCOSE, CAPILLARY     Status: Abnormal   Collection Time    03/09/14  8:04 AM      Result Value Ref Range   Glucose-Capillary 234 (*) 70 - 99 mg/dL   No  results found.  Review of Systems  Constitutional: Negative.   HENT: Negative.   Eyes: Negative.   Respiratory: Negative.   Cardiovascular: Negative.   Gastrointestinal: Negative.   Musculoskeletal: Positive for back pain and joint pain. Negative for falls and myalgias.  Skin: Negative.   Neurological: Negative.   Endo/Heme/Allergies: Negative.   Psychiatric/Behavioral: Negative.     Last menstrual period 02/05/2014. Physical Exam  Constitutional: She is oriented to person, place, and time. She appears well-developed and well-nourished.  HENT:  Head: Normocephalic and atraumatic.  Eyes: EOM are normal. Pupils are equal, round, and reactive to light.  Neck: Normal range of motion.  Cardiovascular: Normal rate and regular rhythm.   Respiratory: Effort normal and breath sounds normal.  Musculoskeletal: Normal range of motion.  Neurological: She is alert and oriented to person, place, and time.  Skin: Skin is warm and dry.  Psychiatric: She has a normal mood and affect. Her behavior is normal. Thought content normal.     Assessment/Plan  Lumbar post-laminectomy syndrome Lumbago Lumbar radiculopathy  PLAN: permanent SCS implant  [name] C 03/09/2014, 9:36 AM

## 2014-03-09 ENCOUNTER — Ambulatory Visit (HOSPITAL_COMMUNITY): Payer: Commercial Managed Care - HMO

## 2014-03-09 ENCOUNTER — Encounter (HOSPITAL_COMMUNITY): Payer: Medicare HMO | Admitting: Emergency Medicine

## 2014-03-09 ENCOUNTER — Encounter (HOSPITAL_COMMUNITY): Payer: Self-pay | Admitting: *Deleted

## 2014-03-09 ENCOUNTER — Ambulatory Visit (HOSPITAL_COMMUNITY): Payer: Medicare HMO

## 2014-03-09 ENCOUNTER — Ambulatory Visit (HOSPITAL_COMMUNITY): Payer: Medicare HMO | Admitting: Certified Registered"

## 2014-03-09 ENCOUNTER — Ambulatory Visit (HOSPITAL_COMMUNITY)
Admission: RE | Admit: 2014-03-09 | Discharge: 2014-03-09 | Disposition: A | Discharge status: Home / Self Care | Payer: Medicare HMO | Source: Ambulatory Visit | Attending: Anesthesiology | Admitting: Anesthesiology

## 2014-03-09 ENCOUNTER — Encounter (HOSPITAL_COMMUNITY)
Admission: RE | Disposition: A | Discharge status: Home / Self Care | Payer: Self-pay | Source: Ambulatory Visit | Attending: Anesthesiology

## 2014-03-09 DIAGNOSIS — Z794 Long term (current) use of insulin: Secondary | ICD-10-CM | POA: Insufficient documentation

## 2014-03-09 DIAGNOSIS — I428 Other cardiomyopathies: Secondary | ICD-10-CM | POA: Insufficient documentation

## 2014-03-09 DIAGNOSIS — F411 Generalized anxiety disorder: Secondary | ICD-10-CM | POA: Insufficient documentation

## 2014-03-09 DIAGNOSIS — Z79899 Other long term (current) drug therapy: Secondary | ICD-10-CM | POA: Insufficient documentation

## 2014-03-09 DIAGNOSIS — K219 Gastro-esophageal reflux disease without esophagitis: Secondary | ICD-10-CM | POA: Diagnosis not present

## 2014-03-09 DIAGNOSIS — M961 Postlaminectomy syndrome, not elsewhere classified: Secondary | ICD-10-CM | POA: Diagnosis not present

## 2014-03-09 DIAGNOSIS — I509 Heart failure, unspecified: Secondary | ICD-10-CM | POA: Insufficient documentation

## 2014-03-09 DIAGNOSIS — J45909 Unspecified asthma, uncomplicated: Secondary | ICD-10-CM | POA: Insufficient documentation

## 2014-03-09 DIAGNOSIS — F319 Bipolar disorder, unspecified: Secondary | ICD-10-CM | POA: Insufficient documentation

## 2014-03-09 DIAGNOSIS — D649 Anemia, unspecified: Secondary | ICD-10-CM | POA: Insufficient documentation

## 2014-03-09 DIAGNOSIS — Z7982 Long term (current) use of aspirin: Secondary | ICD-10-CM | POA: Insufficient documentation

## 2014-03-09 DIAGNOSIS — S21002S Unspecified open wound of left breast, sequela: Secondary | ICD-10-CM

## 2014-03-09 DIAGNOSIS — I10 Essential (primary) hypertension: Secondary | ICD-10-CM | POA: Insufficient documentation

## 2014-03-09 DIAGNOSIS — G5600 Carpal tunnel syndrome, unspecified upper limb: Secondary | ICD-10-CM | POA: Insufficient documentation

## 2014-03-09 DIAGNOSIS — F3289 Other specified depressive episodes: Secondary | ICD-10-CM | POA: Insufficient documentation

## 2014-03-09 DIAGNOSIS — Z888 Allergy status to other drugs, medicaments and biological substances status: Secondary | ICD-10-CM | POA: Insufficient documentation

## 2014-03-09 DIAGNOSIS — I739 Peripheral vascular disease, unspecified: Secondary | ICD-10-CM | POA: Insufficient documentation

## 2014-03-09 DIAGNOSIS — F329 Major depressive disorder, single episode, unspecified: Secondary | ICD-10-CM | POA: Insufficient documentation

## 2014-03-09 DIAGNOSIS — E119 Type 2 diabetes mellitus without complications: Secondary | ICD-10-CM | POA: Diagnosis not present

## 2014-03-09 DIAGNOSIS — E785 Hyperlipidemia, unspecified: Secondary | ICD-10-CM | POA: Insufficient documentation

## 2014-03-09 DIAGNOSIS — IMO0001 Reserved for inherently not codable concepts without codable children: Secondary | ICD-10-CM | POA: Insufficient documentation

## 2014-03-09 HISTORY — PX: SPINAL CORD STIMULATOR INSERTION: SHX5378

## 2014-03-09 LAB — GLUCOSE, CAPILLARY
Glucose-Capillary: 234 mg/dL — ABNORMAL HIGH (ref 70–99)
Glucose-Capillary: 161 mg/dL — ABNORMAL HIGH (ref 70–99)

## 2014-03-09 SURGERY — INSERTION, SPINAL CORD STIMULATOR, LUMBAR
Anesthesia: Monitor Anesthesia Care | Site: Back

## 2014-03-09 MED ORDER — PROPOFOL INFUSION 10 MG/ML OPTIME
INTRAVENOUS | Status: DC | PRN
  Administered 2014-03-09: 50 ug/kg/min via INTRAVENOUS

## 2014-03-09 MED ORDER — OXYCODONE-ACETAMINOPHEN 5-325 MG PO TABS
ORAL_TABLET | ORAL | Status: AC
  Administered 2014-03-09: 2 via ORAL
  Filled 2014-03-09: qty 2

## 2014-03-09 MED ORDER — OXYCODONE HCL 5 MG PO TABS: 5.0000 mg | ORAL_TABLET | Freq: Once | ORAL | Status: DC | PRN

## 2014-03-09 MED ORDER — FENTANYL CITRATE 0.05 MG/ML IJ SOLN
INTRAMUSCULAR | Status: AC
  Filled 2014-03-09: qty 5

## 2014-03-09 MED ORDER — 0.9 % SODIUM CHLORIDE (POUR BTL) OPTIME
TOPICAL | Status: DC | PRN
  Administered 2014-03-09: 1000 mL

## 2014-03-09 MED ORDER — PROPOFOL 10 MG/ML IV BOLUS
INTRAVENOUS | Status: AC
  Filled 2014-03-09: qty 20

## 2014-03-09 MED ORDER — GLYCOPYRROLATE 0.2 MG/ML IJ SOLN
INTRAMUSCULAR | Status: AC
  Filled 2014-03-09: qty 3

## 2014-03-09 MED ORDER — NEOSTIGMINE METHYLSULFATE 10 MG/10ML IV SOLN
INTRAVENOUS | Status: AC
Start: 2014-03-09 — End: 2014-03-09
  Filled 2014-03-09: qty 1

## 2014-03-09 MED ORDER — OXYCODONE-ACETAMINOPHEN 5-325 MG PO TABS
2.0000 | ORAL_TABLET | Freq: Once | ORAL | Status: AC
  Administered 2014-03-09: 2 via ORAL

## 2014-03-09 MED ORDER — MIDAZOLAM HCL 2 MG/2ML IJ SOLN
INTRAMUSCULAR | Status: AC
  Filled 2014-03-09: qty 2

## 2014-03-09 MED ORDER — MIDAZOLAM HCL 5 MG/5ML IJ SOLN
INTRAMUSCULAR | Status: DC | PRN
  Administered 2014-03-09: 2 mg via INTRAVENOUS

## 2014-03-09 MED ORDER — PROMETHAZINE HCL 25 MG/ML IJ SOLN: 6.2500 mg | INTRAMUSCULAR | Status: DC | PRN

## 2014-03-09 MED ORDER — BACITRACIN-NEOMYCIN-POLYMYXIN OINTMENT TUBE
TOPICAL_OINTMENT | CUTANEOUS | Status: DC | PRN
  Administered 2014-03-09: 1 via TOPICAL

## 2014-03-09 MED ORDER — HYDROMORPHONE HCL 1 MG/ML IJ SOLN
INTRAMUSCULAR | Status: AC
  Administered 2014-03-09: 0.5 mg via INTRAVENOUS
  Filled 2014-03-09: qty 1

## 2014-03-09 MED ORDER — OXYCODONE HCL 5 MG/5ML PO SOLN: 5.0000 mg | Freq: Once | ORAL | Status: DC | PRN

## 2014-03-09 MED ORDER — FENTANYL CITRATE 0.05 MG/ML IJ SOLN
INTRAMUSCULAR | Status: DC | PRN
  Administered 2014-03-09 (×3): 50 ug via INTRAVENOUS

## 2014-03-09 MED ORDER — HYDROMORPHONE HCL 1 MG/ML IJ SOLN
0.2500 mg | INTRAMUSCULAR | Status: DC | PRN
  Administered 2014-03-09: 0.5 mg via INTRAVENOUS

## 2014-03-09 MED ORDER — CEPHALEXIN 500 MG PO CAPS: 500.0000 mg | ORAL_CAPSULE | Freq: Four times a day (QID) | ORAL | Status: DC

## 2014-03-09 MED ORDER — LACTATED RINGERS IV SOLN
INTRAVENOUS | Status: DC | PRN
  Administered 2014-03-09: 10:00:00 via INTRAVENOUS

## 2014-03-09 MED ORDER — OXYCODONE-ACETAMINOPHEN 10-325 MG PO TABS
1.0000 | ORAL_TABLET | ORAL | Status: DC | PRN
Start: 2014-03-09 — End: 2015-02-22

## 2014-03-09 MED ORDER — BACITRACIN 50000 UNITS IM SOLR
INTRAMUSCULAR | Status: DC | PRN
  Administered 2014-03-09: 11:00:00

## 2014-03-09 MED ORDER — BUPIVACAINE-EPINEPHRINE (PF) 0.5% -1:200000 IJ SOLN
INTRAMUSCULAR | Status: DC | PRN
  Administered 2014-03-09: 21 mL

## 2014-03-09 MED ORDER — ONDANSETRON HCL 4 MG/2ML IJ SOLN
INTRAMUSCULAR | Status: AC
  Filled 2014-03-09: qty 2

## 2014-03-09 MED ORDER — CEFAZOLIN SODIUM-DEXTROSE 2-3 GM-% IV SOLR
INTRAVENOUS | Status: AC
  Filled 2014-03-09: qty 50

## 2014-03-09 SURGICAL SUPPLY — 67 items
ADHESIVE MEDICAL (Stimulator) ×2 IMPLANT
ANCHOR CLICK (Anchor) ×1 IMPLANT
ANCHOR CLIK NEURO F/LEAD 2 (Anchor) ×1 IMPLANT
BAG DECANTER FOR FLEXI CONT (MISCELLANEOUS) ×2 IMPLANT
BENZOIN TINCTURE PRP APPL 2/3 (GAUZE/BANDAGES/DRESSINGS) IMPLANT
BINDER ABDOMINAL 12 ML 46-62 (SOFTGOODS) ×2 IMPLANT
BLADE 10 SAFETY STRL DISP (BLADE) IMPLANT
BLADE SURG ROTATE 9660 (MISCELLANEOUS) IMPLANT
CABLE/EXTENSION OR 1X16 61 (CABLE) ×4 IMPLANT
CHLORAPREP W/TINT 26ML (MISCELLANEOUS) ×2 IMPLANT
CONT SPEC 4OZ CLIKSEAL STRL BL (MISCELLANEOUS) ×2 IMPLANT
DERMABOND ADHESIVE PROPEN (GAUZE/BANDAGES/DRESSINGS) ×1
DERMABOND ADVANCED (GAUZE/BANDAGES/DRESSINGS)
DERMABOND ADVANCED .7 DNX12 (GAUZE/BANDAGES/DRESSINGS) IMPLANT
DERMABOND ADVANCED .7 DNX6 (GAUZE/BANDAGES/DRESSINGS) ×1 IMPLANT
DRAPE C-ARM 42X72 X-RAY (DRAPES) ×2 IMPLANT
DRAPE C-ARMOR (DRAPES) ×2 IMPLANT
DRAPE LAPAROTOMY 100X72X124 (DRAPES) ×2 IMPLANT
DRAPE POUCH INSTRU U-SHP 10X18 (DRAPES) ×2 IMPLANT
DRAPE SURG 17X23 STRL (DRAPES) IMPLANT
DRESSING TELFA 8X3 (GAUZE/BANDAGES/DRESSINGS) IMPLANT
DRSG OPSITE POSTOP 3X4 (GAUZE/BANDAGES/DRESSINGS) IMPLANT
DRSG OPSITE POSTOP 4X6 (GAUZE/BANDAGES/DRESSINGS) ×4 IMPLANT
ELECT REM PT RETURN 9FT ADLT (ELECTROSURGICAL) ×2
ELECTRODE REM PT RTRN 9FT ADLT (ELECTROSURGICAL) ×1 IMPLANT
GAUZE SPONGE 4X4 16PLY XRAY LF (GAUZE/BANDAGES/DRESSINGS) IMPLANT
GLOVE BIO SURGEON STRL SZ7 (GLOVE) ×4 IMPLANT
GLOVE BIOGEL PI IND STRL 7.5 (GLOVE) ×2 IMPLANT
GLOVE BIOGEL PI INDICATOR 7.5 (GLOVE) ×2
GLOVE ECLIPSE 7.5 STRL STRAW (GLOVE) ×2 IMPLANT
GLOVE EXAM NITRILE LRG STRL (GLOVE) IMPLANT
GLOVE EXAM NITRILE MD LF STRL (GLOVE) IMPLANT
GLOVE EXAM NITRILE XL STR (GLOVE) IMPLANT
GLOVE EXAM NITRILE XS STR PU (GLOVE) IMPLANT
GOWN STRL REUS W/ TWL LRG LVL3 (GOWN DISPOSABLE) ×2 IMPLANT
GOWN STRL REUS W/ TWL XL LVL3 (GOWN DISPOSABLE) IMPLANT
GOWN STRL REUS W/TWL 2XL LVL3 (GOWN DISPOSABLE) IMPLANT
GOWN STRL REUS W/TWL LRG LVL3 (GOWN DISPOSABLE) ×2
GOWN STRL REUS W/TWL XL LVL3 (GOWN DISPOSABLE)
IPG PRECISION SPECTRA (Stimulator) ×2 IMPLANT
KIT BASIN OR (CUSTOM PROCEDURE TRAY) ×2 IMPLANT
KIT CHARGING (KITS) ×1
KIT CHARGING PRECISION NEURO (KITS) ×1 IMPLANT
KIT REMOTE CONTROL PRECISION (KITS) ×2 IMPLANT
KIT ROOM TURNOVER OR (KITS) ×2 IMPLANT
KIT SPLITTER 30CM 2X8 (Stimulator) ×2 IMPLANT
LEAD KIT CONTACT INFINION 16 (Stimulator) ×4 IMPLANT
NEEDLE 18GX1X1/2 (RX/OR ONLY) (NEEDLE) IMPLANT
NEEDLE HYPO 25X1 1.5 SAFETY (NEEDLE) ×2 IMPLANT
NS IRRIG 1000ML POUR BTL (IV SOLUTION) ×2 IMPLANT
PACK LAMINECTOMY NEURO (CUSTOM PROCEDURE TRAY) ×2 IMPLANT
PAD ARMBOARD 7.5X6 YLW CONV (MISCELLANEOUS) ×4 IMPLANT
SPONGE LAP 4X18 X RAY DECT (DISPOSABLE) IMPLANT
SPONGE SURGIFOAM ABS GEL SZ50 (HEMOSTASIS) IMPLANT
STAPLER SKIN PROX WIDE 3.9 (STAPLE) ×2 IMPLANT
STRIP CLOSURE SKIN 1/2X4 (GAUZE/BANDAGES/DRESSINGS) IMPLANT
SUT MNCRL AB 4-0 PS2 18 (SUTURE) ×2 IMPLANT
SUT SILK 0 (SUTURE)
SUT SILK 0 MO-6 18XCR BRD 8 (SUTURE) IMPLANT
SUT SILK 0 TIES 10X30 (SUTURE) IMPLANT
SUT SILK 2 0 TIES 10X30 (SUTURE) IMPLANT
SUT VIC AB 2-0 CP2 18 (SUTURE) ×4 IMPLANT
SYRINGE 10CC LL (SYRINGE) IMPLANT
TOWEL OR 17X24 6PK STRL BLUE (TOWEL DISPOSABLE) ×2 IMPLANT
TOWEL OR 17X26 10 PK STRL BLUE (TOWEL DISPOSABLE) ×2 IMPLANT
WATER STERILE IRR 1000ML POUR (IV SOLUTION) ×2 IMPLANT
YANKAUER SUCT BULB TIP NO VENT (SUCTIONS) IMPLANT

## 2014-03-09 NOTE — Anesthesia Postprocedure Evaluation (Signed)
Anesthesia Post Note  Patient: [name]  Procedure(s) Performed: Procedure(s) (LRB): LUMBAR SPINAL CORD STIMULATOR INSERTION (N/A)  Anesthesia type: general  Patient location: PACU  Post pain: Pain level controlled  Post assessment: Patient's Cardiovascular Status Stable  Last Vitals:  Filed Vitals:   03/09/14 1313  BP: 138/82  Pulse: 86  Temp:   Resp:     Post vital signs: Reviewed and stable  Level of consciousness: sedated  Complications: No apparent anesthesia complications

## 2014-03-09 NOTE — Anesthesia Preprocedure Evaluation (Addendum)
Anesthesia Evaluation  Patient identified by MRN, date of birth, ID band Patient awake    Reviewed: Allergy & Precautions, H&P , NPO status , Patient's Chart, lab work & pertinent test results  History of Anesthesia Complications Negative for: history of anesthetic complications  Airway Mallampati: II TM Distance: >3 FB Neck ROM: Full    Dental  (+) Teeth Intact, Dental Advisory Given   Pulmonary asthma ,    Pulmonary exam normal       Cardiovascular hypertension, Pt. on medications and Pt. on home beta blockers + Peripheral Vascular Disease and +CHF  Echo 11/01/2013 Study Conclusions - Left ventricle: The cavity size was normal. Wall thickness was normal. Systolic function was severely reduced. The estimated  ejection fraction was in the range of 20% to 25%. Diffuse   Hypokinesis. Perfusion scan: Overall Impression:  Normal stress nuclear perfusion study. Findings are most compatible with nonischemic cardiomyopathy. LV Ejection Fraction: 26%.  LV Wall Motion:  Global LV hypokinesis.      Neuro/Psych PSYCHIATRIC DISORDERS Anxiety Depression Bipolar Disorder    GI/Hepatic   Endo/Other  diabetes  Renal/GU      Musculoskeletal   Abdominal   Peds  Hematology   Anesthesia Other Findings   Reproductive/Obstetrics                         Anesthesia Physical Anesthesia Plan  ASA: III  Anesthesia Plan: MAC   Post-op Pain Management:    Induction: Intravenous  Airway Management Planned: Simple Face Mask  Additional Equipment:   Intra-op Plan:   Post-operative Plan:   Informed Consent: I have reviewed the patients History and Physical, chart, labs and discussed the procedure including the risks, benefits and alternatives for the proposed anesthesia with the patient or authorized representative who has indicated his/her understanding and acceptance.   Dental advisory given  Plan  Discussed with: Anesthesiologist, Surgeon and CRNA  Anesthesia Plan Comments:         Anesthesia Quick Evaluation

## 2014-03-09 NOTE — Transfer of Care (Signed)
Immediate Anesthesia Transfer of Care Note  Patient: [name]  Procedure(s) Performed: Procedure(s): LUMBAR SPINAL CORD STIMULATOR INSERTION (N/A)  Patient Location: PACU  Anesthesia Type:General  Level of Consciousness: awake, alert  and oriented  Airway & Oxygen Therapy: Patient Spontanous Breathing and Patient connected to face mask oxygen  Post-op Assessment: Report given to PACU RN, Post -op Vital signs reviewed and stable and Patient moving all extremities X 4  Post vital signs: Reviewed and stable  Complications: No apparent anesthesia complications

## 2014-03-09 NOTE — Op Note (Signed)
PREOP DX: 1) lumbago  2) lumbar radiculopathy  3) lumbar post-laminectomy syndrome  4) chronic pain  POSTOP DX: 1) lumbago  2) lumbar radiculopathy  3) lumbar post-laminectomy syndrome  4) chronic pain  PROCEDURES PERFORMED:1) intraop fluoro 2) placement of 2 16 contact [employer] Infinion leads 3) placement of Spectra SCS generator  SURGEON:Roddie Riegler  ASSISTANT: NONE  ANESTHESIA: MAC  EBL: <20cc  DESCRIPTION OF PROCEDURE: After a discussion of risks, benefits and alternatives, informed consent was obtained. The patient was taken to the OR, turned prone onto a Jackson table, all pressure points padded, SCD's placed, and an adequate plane of anesthesia induced. A timeout was taken to verify the correct patient, position, personnel, availability of appropriate equipment, and administration of perioperative antibiotics.  The thoracic and lumbar areas were widely prepped with chloraprep and draped into a sterile field. Fluoroscopy was used to plan a left paramedian incision at the L1-L3 levels, and an incision made with a 10 blade and carried down to the dorsolumbar fascia with the bovie and blunt dissection. Retractors were placed and a 14g [employer] tuohy needle placed into the epidural space at the L1-2 interspace using biplanar fluoro and loss-of-resistance technique. The needle was aspirated without any return of fluid. A [employer] INFINION lead was introduced and under live AP fluoro advanced until the distal-most contact overlay the caudad aspect of the T8 vertebral body shadow with the rest of the contacts distributed over the T8 and T9 vertebral bodies in a position just right of anatomic midline. A second Infinion lead was placed just left of anatomic midline in the same levels using the same technique. The patient was awakened and the leads tested; impedances were good, and the patient reported good coverage with amplitudes in the 3-7 mA range. 0 silk sutures were placed in  the fascia adjacent to the needles. The needles and stylets were removed under fluoroscopy with no lead migration noted. Leads were then fixed to the fascia with Clik anchors; repeat images were obtained to verify that there had been no lead migration.  The incision was inspected and hemostasis obtained with the bipolar cautery.  Attention was then turned to creation of a subcutaneous pocket. At the left flank, a 3 cm incision was made with a 10 blade and using the bovie and blunt dissection a pocket of size appropriate to place a SCS generator. The pocket was trialed, and found to be of adequate size. The pocket was inspected for hemostasis, which was found to be excellent. Using reverse seldinger technique, the leads were tunneled to the pocket site, and the leads inserted into the SCS generator. Impedances were checked, and all found to be excellent. The leads were then all fixed into position with a self-torquing wrench. The wiring was all carefully coiled, placed behind the generator and placed in the pocket.  Both incisions were copiously irrigated with bacitracin-containing irrigation. The lumbar incision was closed in 3 deeper layers of interrupted 2-0 vicryl and the skin closed with staples. The pocket incision was closed with a deeper layer of 2-0 vicryl interrupted sutures, and the skin closed with staples. Antibiotic ointment and sterile dressings were applied.   Needle, sponge, and instrument counts were correct x2 at the end of the case.    The patient was then carefully awakened from anesthesia, turned supine, an abdominal binder placed, and the patient taken to the recovery room where she underwent complex spinal cord stimulator programming.  COMPLICATIONS: NONE  CONDITION: Stable throughout the  course of the procedure and immediately afterward  DISPOSITION: discharge to home, with antibiotics and pain medicine. Discussed care with the patient and her mother. Followup in clinic will be  scheduled in 10-14 days

## 2014-03-09 NOTE — Discharge Instructions (Signed)
What to eat:  For your first meals, you should eat lightly; only small meals initially.  If you do not have nausea, you may eat larger meals.  Avoid spicy, greasy and heavy food.    General Anesthesia, Adult, Care After  Refer to this sheet in the next few weeks. These instructions provide you with information on caring for yourself after your procedure. Your health care provider may also give you more specific instructions. Your treatment has been planned according to current medical practices, but problems sometimes occur. Call your health care provider if you have any problems or questions after your procedure.  WHAT TO EXPECT AFTER THE PROCEDURE  After the procedure, it is typical to experience:  Sleepiness.  Nausea and vomiting. HOME CARE INSTRUCTIONS  For the first 24 hours after general anesthesia:  Have a responsible person with you.  Do not drive a car. If you are alone, do not take public transportation.  Do not drink alcohol.  Do not take medicine that has not been prescribed by your health care provider.  Do not sign important papers or make important decisions.  You may resume a normal diet and activities as directed by your health care provider.  Change bandages (dressings) as directed.  If you have questions or problems that seem related to general anesthesia, call the hospital and ask for the anesthetist or anesthesiologist on call. SEEK MEDICAL CARE IF:  You have nausea and vomiting that continue the day after anesthesia.  You develop a rash. SEEK IMMEDIATE MEDICAL CARE IF:  You have difficulty breathing.  You have chest pain.  You have any allergic problems. Document Released: 09/07/2000 Document Revised: 02/01/2013 Document Reviewed: 12/15/2012  [facility] Patient Information 2014 [place], [place].   Laceration Care, Adult    A laceration is a cut that goes through all layers of the skin. The cut goes into the tissue beneath the skin.  HOME CARE  For stitches  (sutures) or staples:  Keep the cut clean and dry.  If you have a bandage (dressing), change it at least once a day. Change the bandage if it gets wet or dirty, or as told by your doctor.  Wash the cut with soap and water . Rinse the cut with water. Pat it dry with a clean towel.   You may shower after the first 24 hours. Do not soak the cut in water until the stitches are removed.  Only take medicines as told by your doctor.  Have your stitches or staples removed as told by your doctor. For skin adhesive strips:  Keep the cut clean and dry.  Do not get the strips wet. You may take a bath, but be careful to keep the cut dry.  If the cut gets wet, pat it dry with a clean towel.  The strips will fall off on their own. Do not remove the strips that are still stuck to the cut. For wound glue:  You may shower or take baths. Do not soak or scrub the cut. Do not swim. Avoid heavy sweating until the glue falls off on its own. After a shower or bath, pat the cut dry with a clean towel.  Do not put medicine on your cut until the glue falls off.  If you have a bandage, do not put tape over the glue.  Avoid lots of sunlight or tanning lamps until the glue falls off. Put sunscreen on the cut for the first year to reduce your scar.  The  glue will fall off on its own. Do not pick at the glue. You may need a tetanus shot if:  You cannot remember when you had your last tetanus shot.  You have never had a tetanus shot. If you need a tetanus shot and you choose not to have one, you may get tetanus. Sickness from tetanus can be serious.  GET HELP RIGHT AWAY IF:  Your pain does not get better with medicine.  Your arm, hand, leg, or foot loses feeling (numbness) or changes color.  Your cut is bleeding.  Your joint feels weak, or you cannot use your joint.  You have painful lumps on your body.  Your cut is red, puffy (swollen), or painful.  You have a red line on the skin near the cut.  You have  yellowish-white fluid (pus) coming from the cut.  You have a fever.  You have a bad smell coming from the cut or bandage.  Your cut breaks open before or after stitches are removed.  You notice something coming out of the cut, such as wood or glass.  You cannot move a finger or toe. MAKE SURE YOU:  Understand these instructions.  Will watch your condition.  Will get help right away if you are not doing well or get worse. Document Released: 11/18/2007 Document Revised: 08/24/2011 Document Reviewed: 11/25/2010  [facility] Patient Information 2014 [place], [place].

## 2014-03-10 ENCOUNTER — Encounter (HOSPITAL_COMMUNITY): Payer: Self-pay | Admitting: Emergency Medicine

## 2014-03-10 ENCOUNTER — Emergency Department (HOSPITAL_COMMUNITY)
Admission: EM | Admit: 2014-03-10 | Discharge: 2014-03-10 | Disposition: A | Discharge status: Home / Self Care | Payer: Medicare HMO | Attending: Emergency Medicine | Admitting: Emergency Medicine

## 2014-03-10 DIAGNOSIS — M549 Dorsalgia, unspecified: Secondary | ICD-10-CM | POA: Insufficient documentation

## 2014-03-10 DIAGNOSIS — Z862 Personal history of diseases of the blood and blood-forming organs and certain disorders involving the immune mechanism: Secondary | ICD-10-CM | POA: Insufficient documentation

## 2014-03-10 DIAGNOSIS — M545 Low back pain, unspecified: Secondary | ICD-10-CM | POA: Insufficient documentation

## 2014-03-10 DIAGNOSIS — F319 Bipolar disorder, unspecified: Secondary | ICD-10-CM | POA: Diagnosis not present

## 2014-03-10 DIAGNOSIS — I5023 Acute on chronic systolic (congestive) heart failure: Secondary | ICD-10-CM | POA: Insufficient documentation

## 2014-03-10 DIAGNOSIS — Z791 Long term (current) use of non-steroidal anti-inflammatories (NSAID): Secondary | ICD-10-CM | POA: Diagnosis not present

## 2014-03-10 DIAGNOSIS — Z792 Long term (current) use of antibiotics: Secondary | ICD-10-CM | POA: Diagnosis not present

## 2014-03-10 DIAGNOSIS — Z7982 Long term (current) use of aspirin: Secondary | ICD-10-CM | POA: Diagnosis not present

## 2014-03-10 DIAGNOSIS — G589 Mononeuropathy, unspecified: Secondary | ICD-10-CM | POA: Diagnosis not present

## 2014-03-10 DIAGNOSIS — Z79899 Other long term (current) drug therapy: Secondary | ICD-10-CM | POA: Diagnosis not present

## 2014-03-10 DIAGNOSIS — J45909 Unspecified asthma, uncomplicated: Secondary | ICD-10-CM | POA: Diagnosis not present

## 2014-03-10 DIAGNOSIS — G8929 Other chronic pain: Secondary | ICD-10-CM | POA: Diagnosis not present

## 2014-03-10 DIAGNOSIS — G8918 Other acute postprocedural pain: Secondary | ICD-10-CM | POA: Insufficient documentation

## 2014-03-10 DIAGNOSIS — M19019 Primary osteoarthritis, unspecified shoulder: Secondary | ICD-10-CM | POA: Insufficient documentation

## 2014-03-10 DIAGNOSIS — K219 Gastro-esophageal reflux disease without esophagitis: Secondary | ICD-10-CM | POA: Insufficient documentation

## 2014-03-10 DIAGNOSIS — IMO0002 Reserved for concepts with insufficient information to code with codable children: Secondary | ICD-10-CM | POA: Insufficient documentation

## 2014-03-10 DIAGNOSIS — Z794 Long term (current) use of insulin: Secondary | ICD-10-CM | POA: Diagnosis not present

## 2014-03-10 DIAGNOSIS — F411 Generalized anxiety disorder: Secondary | ICD-10-CM | POA: Insufficient documentation

## 2014-03-10 DIAGNOSIS — Z9889 Other specified postprocedural states: Secondary | ICD-10-CM | POA: Insufficient documentation

## 2014-03-10 DIAGNOSIS — E119 Type 2 diabetes mellitus without complications: Secondary | ICD-10-CM | POA: Insufficient documentation

## 2014-03-10 DIAGNOSIS — I10 Essential (primary) hypertension: Secondary | ICD-10-CM | POA: Insufficient documentation

## 2014-03-10 DIAGNOSIS — G43909 Migraine, unspecified, not intractable, without status migrainosus: Secondary | ICD-10-CM | POA: Diagnosis not present

## 2014-03-10 DIAGNOSIS — Z9884 Bariatric surgery status: Secondary | ICD-10-CM | POA: Insufficient documentation

## 2014-03-10 MED ORDER — OXYCODONE-ACETAMINOPHEN 5-325 MG PO TABS
1.0000 | ORAL_TABLET | Freq: Once | ORAL | Status: AC
  Administered 2014-03-10: 1 via ORAL
  Filled 2014-03-10: qty 1

## 2014-03-10 NOTE — ED Notes (Signed)
Pt. Stated, I had a back stimulator put into my back yesterday and the pain has gotten worse.

## 2014-03-10 NOTE — Discharge Instructions (Signed)
Dr. Norville Haggard recommends that you may double your percocet dose when you pain is worse. Home Health Care evaluation is being arranged to help you at home as you recover. Back Pain, Adult Back pain is very common. The pain often gets better over time. The cause of back pain is usually not dangerous. Most people can learn to manage their back pain on their own.  HOME CARE   Stay active. Start with short walks on flat ground if you can. Try to walk farther each day.  Do not sit, drive, or stand in one place for more than 30 minutes. Do not stay in bed.  Do not avoid exercise or work. Activity can help your back heal faster.  Be careful when you bend or lift an object. Bend at your knees, keep the object close to you, and do not twist.  Sleep on a firm mattress. Lie on your side, and bend your knees. If you lie on your back, put a pillow under your knees.  Only take medicines as told by your doctor.  Put ice on the injured area.  Put ice in a plastic bag.  Place a towel between your skin and the bag.  Leave the ice on for 15-20 minutes, 03-04 times a day for the first 2 to 3 days. After that, you can switch between ice and heat packs.  Ask your doctor about back exercises or massage.  Avoid feeling anxious or stressed. Find good ways to deal with stress, such as exercise. GET HELP RIGHT AWAY IF:   Your pain does not go away with rest or medicine.  Your pain does not go away in 1 week.  You have new problems.  You do not feel well.  The pain spreads into your legs.  You cannot control when you poop (bowel movement) or pee (urinate).  Your arms or legs feel weak or lose feeling (numbness).  You feel sick to your stomach (nauseous) or throw up (vomit).  You have belly (abdominal) pain.  You feel like you may pass out (faint). MAKE SURE YOU:   Understand these instructions.  Will watch your condition.  Will get help right away if you are not doing well or get  worse. Document Released: 11/18/2007 Document Revised: 08/24/2011 Document Reviewed: 10/03/2013 [facility] Patient Information 2015 [place], [place]. This information is not intended to replace advice given to you by your health care provider. Make sure you discuss any questions you have with your health care provider.

## 2014-03-11 NOTE — Care Management (Signed)
LATE ENTRY: CARE MANAGEMENT NOTE 03/11/2014  Patient:  [name], [name]   Account Number:  [id number]  Date Initiated:  03/10/2014  Documentation initiated by:  [facility]  Subjective/Objective Assessment:   ED HH orders     Action/Plan:   set up with [facility]   Anticipated DC Date:  03/10/2014   Anticipated DC Plan:           Choice offered to / List presented to:             Status of service:   Medicare Important Message given?   (If response is "NO", the following Medicare IM given date fields will be blank) Date Medicare IM given:   Medicare IM given by:   Date Additional Medicare IM given:   Additional Medicare IM given by:    Discharge Disposition:    Per UR Regulation:    If discussed at Long Length of Stay Meetings, dates discussed:    Comments:  03/10/14 15:21 CM met with pt in POD A for address verification and contact verification and choice of home health agency.  Pt chooses [facility] to render HHPT/RN.  No other CM needs were communicated.  Mariane Masters, BSN, CM [phone].

## 2014-03-11 NOTE — ED Provider Notes (Signed)
CSN: [id number]     Arrival date & time 03/10/14  1319 History   First MD Initiated Contact with Patient 03/10/14 1401     Chief Complaint  Patient presents with  . Back Pain      HPI  [name] presents with complaint of back pain after a procedure yesterday. She has a history of previous lumbar laminectomy. Chronic pain status post her procedure and "postlaminectomy syndrome". Had a spinal stimulator placed by Dr. Odette Fraction yesterday. States that her pain is "worse".  No drainage from her wound. Ultimately she states that she would like to be admitted because "my mom's orders no one at home to care for me. She does state that she is able to get up and around to perform her ADLs but "I'm sore". She denies bowel or bladder continence changes. She denies numbness weakness or tingling to her extremities. Her pain is made worse with activity. No fevers no chills no drainage from the wounds.  Past Medical History  Diagnosis Date  . Asthma   . GERD (gastroesophageal reflux disease)   . Diabetes mellitus   . Hyperlipidemia   . Migraines   . Hypertension   . Back pain, chronic   . Depression   . Neuropathy   . Anxiety   . Nervous breakdown sept 2013  . Fibromyalgia   . History of gastric bypass Jan 2013  . Insomnia   . Wound of left breast   . Nonischemic cardiomyopathy   . Systolic CHF, acute on chronic     CARDIOLOGIST-- DR Aris Lot NELSON  ([place])  . Shortness of breath     with exertion   . Bipolar disorder   . Arthritis     neck and shoulders   . Anemia     hx of iron transfusions - last one 10/2013   . Peripheral vascular disease   . Carpal tunnel syndrome on both sides    Past Surgical History  Procedure Laterality Date  . Back surgery  November 1992  . Cesarean section  December 1998  . Tubal ligation  January 1999  . Cholecystectomy  1998  . Gastric roux-en-y  06/30/2011    Procedure: LAPAROSCOPIC ROUX-EN-Y GASTRIC;  Surgeon: Mariella Saa, MD;   Location: [facility];  Service: General;  Laterality: N/A;     . Esophagogastroduodenoscopy  07/27/2011    Procedure: ESOPHAGOGASTRODUODENOSCOPY (EGD);  Surgeon: Freddy Jaksch, MD;  Location: [facility];  Service: Endoscopy;  Laterality: N/A;  . Irrigation and debridement abscess Left 11/26/2013    Procedure: IRRIGATION AND DEBRIDEMENT ABSCESS;  Surgeon: Velora Heckler, MD;  Location: [facility];  Service: General;  Laterality: Left;  . Cardiac catheterization  11-14-2013  DR [facility]    NORMAL LV FILLING PRESSURE W/ MILD ELEVATED RV FILLING, POSSIBLY SUFFESTING RV FAILURE OUT OF PROPORTION TO LV FAILURE. EF ABOUT 35% WITH DIFFUSE HYPOKINESIS. NO ANGIOGRAPHIC CAD, NONISCHEMIC CARDIOMYOPATHY  . Incision and drainage of wound Left 12/14/2013    Procedure: IRRIGATION AND DEBRIDEMENT OF LEFT BREAST WOUND WITH PLACEMENT OF A-CELL, WITH CLOSURE ;  Surgeon: Wayland Denis, DO;  Location: [facility];  Service: Plastics;  Laterality: Left;  . Dilitation & currettage/hystroscopy with novasure ablation N/A 02/26/2014    Procedure: DILATATION & CURETTAGE/HYSTEROSCOPY WITH NOVASURE ABLATION;  Surgeon: Meriel Pica, MD;  Location: [facility];  Service: Gynecology;  Laterality: N/A;  . Breast surgery Left     I&D for abcsess    Family History  Problem Relation  Age of Onset  . Diabetes Maternal Aunt   . Diabetes Paternal Aunt   . Diabetes Paternal Grandfather    History  Substance Use Topics  . Smoking status: Never Smoker   . Smokeless tobacco: Never Used  . Alcohol Use: No   OB History   Grav Para Term Preterm Abortions TAB SAB Ect Mult Living                 Review of Systems  Constitutional: Negative for fever, chills, diaphoresis, appetite change and fatigue.  HENT: Negative for mouth sores, sore throat and trouble swallowing.   Eyes: Negative for visual disturbance.  Respiratory: Negative for cough, chest tightness, shortness of breath and wheezing.   Cardiovascular: Negative for chest pain.    Gastrointestinal: Negative for nausea, vomiting, abdominal pain, diarrhea and abdominal distention.  Endocrine: Negative for polydipsia, polyphagia and polyuria.  Genitourinary: Negative for dysuria, frequency and hematuria.  Musculoskeletal: Positive for back pain. Negative for gait problem.  Skin: Negative for color change, pallor and rash.  Neurological: Negative for dizziness, syncope, light-headedness and headaches.  Hematological: Does not bruise/bleed easily.  Psychiatric/Behavioral: Negative for behavioral problems and confusion.      Allergies  Imitrex  Home Medications   Prior to Admission medications   Medication Sig Start Date End Date Taking? Authorizing Provider  albuterol (PROVENTIL HFA;VENTOLIN HFA) 108 (90 BASE) MCG/ACT inhaler Inhale 2 puffs into the lungs every 6 (six) hours as needed for wheezing.    Yes Historical Provider, MD  ALPRAZolam (XANAX) 0.25 MG tablet Take 1 tablet (0.25 mg total) by mouth 2 (two) times daily as needed for anxiety. 12/19/13  Yes Lars Masson, MD  aspirin EC 81 MG tablet Take 1 tablet (81 mg total) by mouth daily. 11/02/13  Yes Lars Masson, MD  Biotin 5000 MCG TABS Take 5,000 mcg by mouth daily.   Yes Historical Provider, MD  buPROPion (WELLBUTRIN XL) 300 MG 24 hr tablet Take 300 mg by mouth daily before breakfast.  02/23/13  Yes Historical Provider, MD  cephALEXin (KEFLEX) 500 MG capsule Take 1 capsule (500 mg total) by mouth 4 (four) times daily. 03/09/14  Yes Gwynne Edinger, MD  citalopram (CELEXA) 40 MG tablet Take 40 mg by mouth daily before breakfast.    Yes Historical Provider, MD  cyclobenzaprine (FLEXERIL) 10 MG tablet Take 10 mg by mouth 3 (three) times daily as needed for muscle spasms.  11/23/13  Yes Historical Provider, MD  fluticasone (FLONASE) 50 MCG/ACT nasal spray Place 1 spray into both nostrils daily.  03/07/13  Yes Historical Provider, MD  furosemide (LASIX) 40 MG tablet Take 40 mg by mouth 2 (two) times daily.   Yes  Historical Provider, MD  ibuprofen (ADVIL,MOTRIN) 800 MG tablet Take 800 mg by mouth every 8 (eight) hours as needed (pain).    Yes Historical Provider, MD  Insulin Glargine (LANTUS SOLOSTAR) 100 UNIT/ML Solostar Pen Inject 74 Units into the skin daily.   Yes Historical Provider, MD  insulin glulisine (APIDRA) 100 UNIT/ML injection Inject 5 Units into the skin 3 (three) times daily before meals.   Yes Historical Provider, MD  lidocaine (LIDODERM) 5 % Place 1 patch onto the skin daily as needed (for back pain).  03/21/13  Yes Historical Provider, MD  lubiprostone (AMITIZA) 24 MCG capsule Take 24 mcg by mouth 2 (two) times daily as needed for constipation.   Yes Historical Provider, MD  metolazone (ZAROXOLYN) 2.5 MG tablet Take 1 tablet (2.5 mg  total) by mouth daily. 10/13/13  Yes Lars Masson, MD  nebivolol (BYSTOLIC) 2.5 MG tablet Take 2.5 mg by mouth daily before breakfast.    Yes Historical Provider, MD  olmesartan (BENICAR) 20 MG tablet Take 20 mg by mouth daily.   Yes Historical Provider, MD  oxyCODONE-acetaminophen (PERCOCET) 10-325 MG per tablet Take 1 tablet by mouth every 4 (four) hours as needed for pain. 03/09/14  Yes Gwynne Edinger, MD  potassium chloride SA (KLOR-CON M20) 20 MEQ tablet Take 1 tablet (20 mEq total) by mouth daily. 10/13/13  Yes Lars Masson, MD  pregabalin (LYRICA) 150 MG capsule Take 150 mg by mouth 3 (three) times daily.   Yes Historical Provider, MD  ranitidine (ZANTAC) 150 MG tablet Take 150 mg by mouth daily. 02/11/13  Yes Historical Provider, MD  tiZANidine (ZANAFLEX) 4 MG tablet Take 4 mg by mouth at bedtime as needed for muscle spasms.   Yes Historical Provider, MD   BP 103/66  Pulse 87  Temp(Src) 97.8 F (36.6 C) (Oral)  Resp 18  Ht 5\' 5"  (1.651 m)  Wt 215 lb (97.523 kg)  BMI 35.78 kg/m2  SpO2 100%  LMP 01/26/2014 Physical Exam  Constitutional: She is oriented to person, place, and time. She appears well-developed and well-nourished. No distress.    HENT:  Head: Normocephalic.  Eyes: Conjunctivae are normal. Pupils are equal, round, and reactive to light. No scleral icterus.  Neck: Normal range of motion. Neck supple. No thyromegaly present.  Cardiovascular: Normal rate and regular rhythm.  Exam reveals no gallop and no friction rub.   No murmur heard. Pulmonary/Chest: Effort normal and breath sounds normal. No respiratory distress. She has no wheezes. She has no rales.  Abdominal: Soft. Bowel sounds are normal. She exhibits no distension. There is no tenderness. There is no rebound.  Musculoskeletal: Normal range of motion.       Arms: Neurological: She is alert and oriented to person, place, and time.  Normal symmetric Strength to shoulder shrug, triceps, biceps, grip,wrist flex/extend,and intrinsics  Norma lsymmetric sensation above and below clavicles, and to all distributions to UEs. Norma symmetric strength to flex/.extend hip and knees, dorsi/plantar flex ankles. Normal symmetric sensation to all distributions to LEs Patellar and achilles reflexes 1-2+. Downgoing Babinski Patient will stand and walk. Has a slow and antalgic gait. No motor deficits noted.  Skin: Skin is warm and dry. No rash noted.  Psychiatric: She has a normal mood and affect. Her behavior is normal.    ED Course  Procedures (including critical care time) Labs Review Labs Reviewed - No data to display  Imaging Review Dg Thoracic Spine 1 View  03/09/2014   CLINICAL DATA:  Neurostimulator.  EXAM: DG C-ARM 61-120 MIN; THORACIC SPINE - 1 VIEW  TECHNIQUE: Single frontal fluoroscopic intraoperative spot study obtained.  CONTRAST:  None.  FLUOROSCOPY TIME:  4 min 31 seconds.  COMPARISON:  None.  FINDINGS: Neurostimulator leads noted over the lower thoracic spine.  IMPRESSION: Neurostimulator leads noted over the lower thoracic spine on single frontal view .   Electronically Signed   By: Maisie Fus  Register   On: 03/09/2014 11:24   Dg C-arm 1-60 Min  03/09/2014    CLINICAL DATA:  Neurostimulator.  EXAM: DG C-ARM 61-120 MIN; THORACIC SPINE - 1 VIEW  TECHNIQUE: Single frontal fluoroscopic intraoperative spot study obtained.  CONTRAST:  None.  FLUOROSCOPY TIME:  4 min 31 seconds.  COMPARISON:  None.  FINDINGS: Neurostimulator leads noted over the lower  thoracic spine.  IMPRESSION: Neurostimulator leads noted over the lower thoracic spine on single frontal view .   Electronically Signed   By: Maisie Fus  Register   On: 03/09/2014 11:24     EKG Interpretation None      MDM   Final diagnoses:  Left-sided low back pain without sciatica  Post-op pain   I do not find evidence historically, or objectively on exam that would suggest an acute neurological process such as infection or epidural hematoma or infection. I think her symptoms are more related to her ADLs. Have consulted care management to discuss possible home health care nurse and physical therapy evaluation. I discussed case with Dr. Ollen Bowl who is in agreement.    Rolland Porter, MD 03/11/14 [phone]

## 2014-03-12 ENCOUNTER — Ambulatory Visit (HOSPITAL_COMMUNITY): Payer: Commercial Managed Care - HMO

## 2014-03-12 NOTE — Care Management Note (Signed)
    Page 1 of 1   03/11/2014     7:55:45 AM CARE MANAGEMENT NOTE 03/11/2014  Patient:  [name], [name]   Account Number:  [id number]  Date Initiated:  03/10/2014  Documentation initiated by:  [facility]  Subjective/Objective Assessment:   ED HH orders     Action/Plan:   set up with [facility]   Anticipated DC Date:  03/10/2014   Anticipated DC Plan:           Choice offered to / List presented to:             Status of service:   Medicare Important Message given?   (If response is "NO", the following Medicare IM given date fields will be blank) Date Medicare IM given:   Medicare IM given by:   Date Additional Medicare IM given:   Additional Medicare IM given by:    Discharge Disposition:    Per UR Regulation:    If discussed at Long Length of Stay Meetings, dates discussed:    Comments:  03/10/14 15:21 CM met with pt in POD A for address verification and contact verification and choice of home health agency.  Pt chooses [facility] to render HHPT/RN.  No other CM needs were communicated.  Mariane Masters, BSN, CM [phone].

## 2014-03-13 ENCOUNTER — Telehealth: Payer: Self-pay | Admitting: Cardiology

## 2014-03-13 ENCOUNTER — Telehealth: Payer: Self-pay | Admitting: Hematology

## 2014-03-13 ENCOUNTER — Encounter (HOSPITAL_COMMUNITY): Payer: Self-pay | Admitting: Anesthesiology

## 2014-03-13 ENCOUNTER — Other Ambulatory Visit: Payer: Self-pay | Admitting: *Deleted

## 2014-03-13 DIAGNOSIS — D649 Anemia, unspecified: Secondary | ICD-10-CM

## 2014-03-13 DIAGNOSIS — Z862 Personal history of diseases of the blood and blood-forming organs and certain disorders involving the immune mechanism: Secondary | ICD-10-CM

## 2014-03-13 DIAGNOSIS — K921 Melena: Secondary | ICD-10-CM

## 2014-03-13 NOTE — Telephone Encounter (Signed)
New message ° ° ° ° °Want test results °

## 2014-03-13 NOTE — Telephone Encounter (Signed)
Pt is calling for test results that were recently done.  Informed pt that our office has not ordered any test on her, but noted in her chart that her oncology Doctors office spoke with her today as noted in a telephone encounter and maybe they were returning a call back.  Pt verbalized understanding and states "I have a hard time keeping track with all these doctors." Pt pleased with all the assistance provided.

## 2014-03-13 NOTE — Telephone Encounter (Signed)
Pt called to set up apt, per 09/29 POF, scheduled labs/ov and pt confirmed......Marland Kitchen KJ

## 2014-03-14 ENCOUNTER — Ambulatory Visit (HOSPITAL_COMMUNITY): Payer: Commercial Managed Care - HMO

## 2014-03-16 ENCOUNTER — Ambulatory Visit (HOSPITAL_COMMUNITY): Payer: Commercial Managed Care - HMO

## 2014-03-19 ENCOUNTER — Ambulatory Visit (HOSPITAL_COMMUNITY): Payer: Commercial Managed Care - HMO

## 2014-03-20 ENCOUNTER — Ambulatory Visit (HOSPITAL_BASED_OUTPATIENT_CLINIC_OR_DEPARTMENT_OTHER): Payer: Medicare HMO | Admitting: Hematology

## 2014-03-20 ENCOUNTER — Ambulatory Visit (HOSPITAL_BASED_OUTPATIENT_CLINIC_OR_DEPARTMENT_OTHER): Payer: Medicare HMO

## 2014-03-20 ENCOUNTER — Telehealth: Payer: Self-pay | Admitting: Hematology

## 2014-03-20 ENCOUNTER — Other Ambulatory Visit (HOSPITAL_BASED_OUTPATIENT_CLINIC_OR_DEPARTMENT_OTHER): Payer: Medicare HMO

## 2014-03-20 ENCOUNTER — Encounter: Payer: Self-pay | Admitting: Hematology

## 2014-03-20 VITALS — BP 140/97 | HR 74 | Temp 98.5°F | Resp 18 | Ht 65.0 in | Wt 226.3 lb

## 2014-03-20 VITALS — BP 125/67 | HR 87

## 2014-03-20 DIAGNOSIS — D500 Iron deficiency anemia secondary to blood loss (chronic): Secondary | ICD-10-CM

## 2014-03-20 DIAGNOSIS — Z862 Personal history of diseases of the blood and blood-forming organs and certain disorders involving the immune mechanism: Secondary | ICD-10-CM

## 2014-03-20 DIAGNOSIS — Z9884 Bariatric surgery status: Secondary | ICD-10-CM

## 2014-03-20 DIAGNOSIS — K921 Melena: Secondary | ICD-10-CM

## 2014-03-20 DIAGNOSIS — D509 Iron deficiency anemia, unspecified: Secondary | ICD-10-CM

## 2014-03-20 DIAGNOSIS — D539 Nutritional anemia, unspecified: Secondary | ICD-10-CM

## 2014-03-20 DIAGNOSIS — E119 Type 2 diabetes mellitus without complications: Secondary | ICD-10-CM

## 2014-03-20 DIAGNOSIS — N921 Excessive and frequent menstruation with irregular cycle: Secondary | ICD-10-CM

## 2014-03-20 DIAGNOSIS — D649 Anemia, unspecified: Secondary | ICD-10-CM

## 2014-03-20 DIAGNOSIS — I5023 Acute on chronic systolic (congestive) heart failure: Secondary | ICD-10-CM

## 2014-03-20 LAB — COMPREHENSIVE METABOLIC PANEL (CC13)
ALBUMIN: 3 g/dL — AB (ref 3.5–5.0)
ALK PHOS: 151 U/L — AB (ref 40–150)
ALT: 30 U/L (ref 0–55)
AST: 24 U/L (ref 5–34)
Anion Gap: 6 mEq/L (ref 3–11)
BILIRUBIN TOTAL: 0.29 mg/dL (ref 0.20–1.20)
BUN: 18.1 mg/dL (ref 7.0–26.0)
CO2: 26 meq/L (ref 22–29)
Calcium: 8.9 mg/dL (ref 8.4–10.4)
Chloride: 110 mEq/L — ABNORMAL HIGH (ref 98–109)
Creatinine: 0.9 mg/dL (ref 0.6–1.1)
Glucose: 132 mg/dl (ref 70–140)
Potassium: 4.1 mEq/L (ref 3.5–5.1)
SODIUM: 142 meq/L (ref 136–145)
Total Protein: 6.9 g/dL (ref 6.4–8.3)

## 2014-03-20 LAB — CBC WITH DIFFERENTIAL/PLATELET
BASO%: 0.7 % (ref 0.0–2.0)
Basophils Absolute: 0.1 10*3/uL (ref 0.0–0.1)
EOS%: 3 % (ref 0.0–7.0)
Eosinophils Absolute: 0.2 10*3/uL (ref 0.0–0.5)
HCT: 33.3 % — ABNORMAL LOW (ref 34.8–46.6)
HGB: 10.8 g/dL — ABNORMAL LOW (ref 11.6–15.9)
LYMPH%: 23.5 % (ref 14.0–49.7)
MCH: 29.5 pg (ref 25.1–34.0)
MCHC: 32.4 g/dL (ref 31.5–36.0)
MCV: 91.1 fL (ref 79.5–101.0)
MONO#: 0.5 10*3/uL (ref 0.1–0.9)
MONO%: 6.2 % (ref 0.0–14.0)
NEUT%: 66.6 % (ref 38.4–76.8)
NEUTROS ABS: 5.2 10*3/uL (ref 1.5–6.5)
PLATELETS: 281 10*3/uL (ref 145–400)
RBC: 3.65 10*6/uL — AB (ref 3.70–5.45)
RDW: 14 % (ref 11.2–14.5)
WBC: 7.9 10*3/uL (ref 3.9–10.3)
lymph#: 1.8 10*3/uL (ref 0.9–3.3)

## 2014-03-20 LAB — IRON AND TIBC CHCC
%SAT: 20 % — AB (ref 21–57)
IRON: 67 ug/dL (ref 41–142)
TIBC: 341 ug/dL (ref 236–444)
UIBC: 275 ug/dL (ref 120–384)

## 2014-03-20 LAB — HOLD TUBE, BLOOD BANK

## 2014-03-20 LAB — FERRITIN CHCC: Ferritin: 33 ng/ml (ref 9–269)

## 2014-03-20 MED ORDER — SODIUM CHLORIDE 0.9 % IV SOLN
1020.0000 mg | Freq: Once | INTRAVENOUS | Status: AC
  Administered 2014-03-20: 1020 mg via INTRAVENOUS
  Filled 2014-03-20: qty 34

## 2014-03-20 MED ORDER — CYANOCOBALAMIN 1000 MCG/ML IJ SOLN
INTRAMUSCULAR | Status: AC
  Filled 2014-03-20: qty 1

## 2014-03-20 MED ORDER — CYANOCOBALAMIN 1000 MCG/ML IJ SOLN
1000.0000 ug | INTRAMUSCULAR | Status: DC
  Administered 2014-03-20: 1000 ug via INTRAMUSCULAR

## 2014-03-20 MED ORDER — SODIUM CHLORIDE 0.9 % IV SOLN
Freq: Once | INTRAVENOUS | Status: AC
  Administered 2014-03-20: 12:00:00 via INTRAVENOUS

## 2014-03-20 NOTE — Telephone Encounter (Signed)
, °

## 2014-03-20 NOTE — Patient Instructions (Signed)

## 2014-03-20 NOTE — Progress Notes (Signed)
[facility] OFFICE PROGRESS NOTE 03/20/2014  Leonides [name], [name]  DIAGNOSIS: Deficiency anemia - Plan: Vitamin B12, Folate, Serum, SPEP (Serum protein electrophoresis), TSH-Thyroid Stimulating Hormone, Selenium serum, Vitamin A, Vitamin C, Zinc, Copper, serum, Ceruloplasmin, Vitamin D 25 hydroxy, CBC with Differential, Hold Tube, Blood Bank, Ferritin, Iron and TIBC [facility], ferumoxytol (FERAHEME) 1,020 mg in sodium chloride 0.9 % 100 mL IVPB, cyanocobalamin ((VITAMIN B-12)) injection 1,000 mcg  Chief Complaint  Patient presents with  . Follow-up    CURRENT TREATMENT:  Feraheme 1,020 mg given today  INTERVAL HISTORY:  [name] 43 y.o. female with a history of IDA secondary heavy menses and also a history of gastric bypass is here for follow up.  She was last seen on 03/20/2014 by Dr Juliann Mule.  Her hemoglobin was 7.9 and she received feraheme 1,020 mg.   She reports increased energy.  She is having difficulties sleeping related to her recent diagnosis of CHF.  She reports an extended family history of CHF. She was started on coreg and lasix.  Her ace inhibitor was discontinued due to cough.  She is now on benicar.   Of note, she presented [facility] emergency room on 10/01/2013 with dyspnea and low energy. At that time, her hemoglobin was in the high 7s. She was recommended for hematology referral because the patient was taking her iron supplement as recommended for the past several months. She was taking ferrous sulfate 325 mg tid with orange juice per her PCP. At the emergency room her hemoglobin was 8.4, and she was not transfused. Her FOBT test was negative. Liver function tests were elevated (AST 136, ALT 66, Alk phos 203) and pro-BNP was high at 1711. CXR showed cardiomegaly, mild vascular congestion, small bilateral effusions, possibly early CHF.  Her lasix has increased from 40 mg daily to 80  mg daily due to lower extremity swelling. She was discharged with follow up with cardiology and hematology. She saw Dr. Lisbeth Ply on 10/03/2013.   She reports having surgery on 12/14/2013 by Dr. Migdalia Dk for left breast infection/abscess. It started a small bump that increased.  She had preparation of left breast wound for placement of Acell (500 mg powder and 7 x 10 cm sheet) with excision of skin and subcutaneous tissue. Primary closure after undermining. She had a prior admission on 11/27/2013 for left breast abscess s/p I and D and excisional biopsy of skin and subcutaneous tissue  By Dr. Harlow Asa with pathology consistent with abscess. She is going back and forward to wound care and she reports draining.   She denies fevers. She reports improvement in her pain. She is on doxycycline 100 mg bid.    She she does not eat in her fruits and vegetables because of the concern of hyperglycemia. She does complain of trouble at nighttime vision and for that reason she does not drive at night time and she may have vitamin C as well as a vitamin A deficiency as possible causes of the symptoms do to her gastric bypass. She also have congestive heart failure at a young age  MEDICAL HISTORY: Past Medical History  Diagnosis Date  . Asthma   . GERD (gastroesophageal reflux disease)   . Diabetes mellitus   . Hyperlipidemia   . Migraines   . Hypertension   . Back pain, chronic   . Depression   . Neuropathy   . Anxiety   . Nervous breakdown  sept 2013  . Fibromyalgia   . History of gastric bypass Jan 2013  . Insomnia   . Wound of left breast   . Nonischemic cardiomyopathy   . Systolic CHF, acute on chronic     CARDIOLOGIST-- DR Houston Siren NELSON  (Velora Heckler)  . Shortness of breath     with exertion   . Bipolar disorder   . Arthritis     neck and shoulders   . Anemia     hx of iron transfusions - last one 10/2013   . Peripheral vascular disease   . Carpal tunnel syndrome on both sides     INTERIM HISTORY:  has Morbid obesity; IDDM (insulin dependent diabetes mellitus); Degenerative disc disease; Nausea and vomiting; Major depressive disorder, recurrent episode, moderate; Compulsive behavior disorder; History of gastric bypass; Anxiety disorder; Nervous breakdown; Hematochezia; Anemia; DM type 2 (diabetes mellitus, type 2); Generalized weakness; Generalized abdominal pain; Hyperactive bowel sounds; Dehydration; Insomnia; Diabetic neuropathy; Migraine, unspecified, without mention of intractable migraine without mention of status migrainosus; History of iron deficiency anemia; Acute on chronic systolic heart failure; Left breast abscess; Breast injury; and Open breast wound on her problem list.    ALLERGIES:  is allergic to imitrex.  MEDICATIONS: has a current medication list which includes the following prescription(s): albuterol, alprazolam, aspirin ec, biotin, bupropion, cephalexin, citalopram, cyclobenzaprine, fluticasone, furosemide, ibuprofen, insulin glargine, insulin glulisine, lidocaine, lubiprostone, metolazone, nebivolol, olmesartan, oxycodone-acetaminophen, potassium chloride sa, pregabalin, ranitidine, and tizanidine, and the following Facility-Administered Medications: cyanocobalamin.  SURGICAL HISTORY:  Past Surgical History  Procedure Laterality Date  . Back surgery  November 1992  . Cesarean section  December 1998  . Tubal ligation  January 1999  . Cholecystectomy  1998  . Gastric roux-en-y  06/30/2011    Procedure: LAPAROSCOPIC ROUX-EN-Y GASTRIC;  Surgeon: Edward Jolly, Linda;  Location: [facility];  Service: General;  Laterality: N/A;     . Esophagogastroduodenoscopy  07/27/2011    Procedure: ESOPHAGOGASTRODUODENOSCOPY (EGD);  Surgeon: Landry Dyke, Linda;  Location: [facility];  Service: Endoscopy;  Laterality: N/A;  . Irrigation and debridement abscess Left 11/26/2013    Procedure: IRRIGATION AND DEBRIDEMENT ABSCESS;  Surgeon: Earnstine Regal, Linda;  Location: [facility];  Service:  General;  Laterality: Left;  . Cardiac catheterization  11-14-2013  DR [facility]    NORMAL LV FILLING PRESSURE W/ MILD ELEVATED RV FILLING, POSSIBLY SUFFESTING RV FAILURE OUT OF PROPORTION TO LV FAILURE. EF ABOUT 35% WITH DIFFUSE HYPOKINESIS. NO ANGIOGRAPHIC CAD, NONISCHEMIC CARDIOMYOPATHY  . Incision and drainage of wound Left 12/14/2013    Procedure: IRRIGATION AND DEBRIDEMENT OF LEFT BREAST WOUND WITH PLACEMENT OF A-CELL, WITH CLOSURE ;  Surgeon: Theodoro Kos, DO;  Location: [facility];  Service: Plastics;  Laterality: Left;  . Dilitation & currettage/hystroscopy with novasure ablation N/A 02/26/2014    Procedure: DILATATION & CURETTAGE/HYSTEROSCOPY WITH NOVASURE ABLATION;  Surgeon: Margarette Asal, Linda;  Location: [facility];  Service: Gynecology;  Laterality: N/A;  . Breast surgery Left     I&D for abcsess   . Spinal cord stimulator insertion N/A 03/09/2014    Procedure: LUMBAR SPINAL CORD STIMULATOR INSERTION;  Surgeon: Bonna Gains, Linda;  Location: [facility];  Service: Neurosurgery;  Laterality: N/A;    REVIEW OF SYSTEMS:   Constitutional: Denies fevers, chills or abnormal weight loss; Positive fatigue Eyes: Denies blurriness of vision Ears, nose, mouth, throat, and face: Denies mucositis or sore throat Respiratory: Denies cough, dyspnea or wheezes Cardiovascular: Denies palpitation, chest discomfort or lower  extremity swelling Gastrointestinal:  Denies nausea, heartburn or change in bowel habits Skin: Denies abnormal skin rashes Lymphatics: Denies new lymphadenopathy or easy bruising Neurological:Denies numbness, tingling or new weaknesses Behavioral/Psych: Mood is stable, no new changes  All other systems were reviewed with the patient and are negative.  PHYSICAL EXAMINATION: ECOG PERFORMANCE STATUS: 0  Blood pressure 140/97, pulse 74, temperature 98.5 F (36.9 C), temperature source Oral, resp. rate 18, height 5' 5" (1.651 m), weight 226 lb 4.8 oz (102.649 kg), last menstrual  period 01/26/2014, SpO2 100.00%.  GENERAL:alert, no distress and comfortable; moderately obese SKIN: skin color, texture, turgor are normal, no rashes or significant lesions; multiple scarred lesion on lower extremity.  EYES: normal, Conjunctiva are pink and non-injected, sclera clear OROPHARYNX:no exudate, no erythema and lips, buccal mucosa, and tongue normal  NECK: supple, thyroid normal size, non-tender, without nodularity LYMPH:  no palpable lymphadenopathy in the cervical, axillary or supraclavicular BREASTS: No examined.  LUNGS: clear to auscultation with normal breathing effort, no wheezes or rhonchi HEART: regular rate & rhythm and no murmurs and no lower extremity edema ABDOMEN:abdomen soft, non-tender and normal bowel sounds Musculoskeletal:no cyanosis of digits and no clubbing  NEURO: alert & oriented x 3 with fluent speech, no focal motor/sensory deficits  Labs:  Lab Results  Component Value Date   WBC 7.9 03/20/2014   HGB 10.8* 03/20/2014   HCT 33.3* 03/20/2014   MCV 91.1 03/20/2014   PLT 281 03/20/2014   NEUTROABS 5.2 03/20/2014      Chemistry      Component Value Date/Time   NA 142 03/20/2014 0906   NA 139 03/08/2014 1231   NA 141 04/12/2013 1026   K 4.1 03/20/2014 0906   K 5.1 03/08/2014 1231   CL 101 03/08/2014 1231   CO2 26 03/20/2014 0906   CO2 23 03/08/2014 1231   BUN 18.1 03/20/2014 0906   BUN 33* 03/08/2014 1231   BUN 12 04/12/2013 1026   CREATININE 0.9 03/20/2014 0906   CREATININE 0.90 03/08/2014 1231   CREATININE 0.76 03/20/2011 1302      Component Value Date/Time   CALCIUM 8.9 03/20/2014 0906   CALCIUM 9.0 03/08/2014 1231   ALKPHOS 151* 03/20/2014 0906   ALKPHOS 197* 03/08/2014 1231   AST 24 03/20/2014 0906   AST 87* 03/08/2014 1231   ALT 30 03/20/2014 0906   ALT 55* 03/08/2014 1231   BILITOT 0.29 03/20/2014 0906   BILITOT 0.3 03/08/2014 1231        CBC:  Recent Labs Lab 03/20/14 0905  WBC 7.9  NEUTROABS 5.2  HGB 10.8*  HCT 33.3*  MCV 91.1  PLT 281     Anemia work up  Recent Labs  03/20/14 0905  FERRITIN 33  TIBC 341  IRON 67    Studies:  No results found.   RADIOGRAPHIC STUDIES: None   ASSESSMENT: Leodis Sias 44 y.o. female with a history of Deficiency anemia - Plan: Vitamin B12, Folate, Serum, SPEP (Serum protein electrophoresis), TSH-Thyroid Stimulating Hormone, Selenium serum, Vitamin A, Vitamin C, Zinc, Copper, serum, Ceruloplasmin, Vitamin D 25 hydroxy, CBC with Differential, Hold Tube, Blood Bank, Ferritin, Iron and TIBC [facility], ferumoxytol (FERAHEME) 1,020 mg in sodium chloride 0.9 % 100 mL IVPB, cyanocobalamin ((VITAMIN B-12)) injection 1,000 mcg   PLAN:   1.  H.o. severe IDA secondary to #2.  --Previously, her iron indices demonstrated iron level of 67 and ferritin of 33. Her hemoglobin is 10.8 with MCV of 91. She has two factor contributing  to iron deficiency secondary to menorrhagia and/or s/p gastric bypass surgery. GI workup including FOBT was negative. We gave her feraheme 1,020 mg intravenously  On 04/30 and another infusion was given today. She was also started on B12 1000 mcg IM once a month.   -- I also suspect that because of her gastric bypass, her dietary history and her symptoms she may have other etiologies to explain her anemia as well so I ordered the above tests.  2. Menorrhagia.  -- to follow with Gynecology.   4. Early CHF.   --Patient is following with [facility]. Check selenium level as it can also cause CHF if deficient.  5. DM2 -- Continue management per PCP.     6. Poor Night Vision:  -- Check Vitamin A as deficiency leads to Nyctalopia.  6. Follow up.  --She will follow up with CBC and iron studies in 1 month and to go over results of other tests we did today.    All questions were answered. The patient knows to call the clinic with any problems, questions or concerns. We can certainly see the patient much sooner if necessary.  I spent 25 minutes counseling the  patient face to face. The total time spent in the appointment was 30 minutes.    Bernadene Bell, Linda Medical Hematologist/Oncologist [place] Pager: [phone] Office No: [phone]

## 2014-03-21 ENCOUNTER — Ambulatory Visit (HOSPITAL_COMMUNITY): Payer: Commercial Managed Care - HMO

## 2014-03-23 ENCOUNTER — Ambulatory Visit (HOSPITAL_COMMUNITY): Payer: Commercial Managed Care - HMO

## 2014-03-26 ENCOUNTER — Ambulatory Visit (HOSPITAL_COMMUNITY): Payer: Commercial Managed Care - HMO

## 2014-03-28 ENCOUNTER — Ambulatory Visit (HOSPITAL_COMMUNITY): Payer: Commercial Managed Care - HMO

## 2014-03-30 ENCOUNTER — Other Ambulatory Visit: Payer: Self-pay

## 2014-03-30 ENCOUNTER — Ambulatory Visit (HOSPITAL_COMMUNITY): Payer: Commercial Managed Care - HMO

## 2014-03-30 ENCOUNTER — Telehealth: Payer: Self-pay | Admitting: Cardiology

## 2014-03-30 ENCOUNTER — Other Ambulatory Visit: Payer: Self-pay | Admitting: Cardiology

## 2014-03-30 MED ORDER — FUROSEMIDE 40 MG PO TABS: 80.0000 mg | ORAL_TABLET | Freq: Two times a day (BID) | ORAL | Status: DC

## 2014-03-30 NOTE — Telephone Encounter (Signed)
Pt st she has gained 13 lbs in one week.  She has SOB when walking to the backdoor.  Pt st she is having trouble sleeping because she has a dry cough. She is swollen in her stomach and in her feet. Spoke with Norma Fredrickson, and per Lawson Fiscal instructed patient to increase her Lasix to 80 mg twice daily. F/U appointment scheduled with Tereso Newcomer on Monday at 1540. Instructed patient to go to the ER or call 911 if SOB or other symptoms worsen over the weekend.

## 2014-03-30 NOTE — Telephone Encounter (Signed)
New Message     Pt calling stating that she has gained 13 lbs in the course of a week and is concerned. Pt would like a call back so she can speak with a nurse.

## 2014-03-30 NOTE — Telephone Encounter (Signed)
Left message to call back  

## 2014-03-30 NOTE — Telephone Encounter (Signed)
I agree, thank you.

## 2014-04-02 ENCOUNTER — Ambulatory Visit (HOSPITAL_COMMUNITY): Payer: Commercial Managed Care - HMO

## 2014-04-02 ENCOUNTER — Encounter: Payer: Self-pay | Admitting: Physician Assistant

## 2014-04-02 ENCOUNTER — Ambulatory Visit (INDEPENDENT_AMBULATORY_CARE_PROVIDER_SITE_OTHER): Payer: Commercial Managed Care - HMO | Admitting: Physician Assistant

## 2014-04-02 VITALS — BP 118/80 | HR 80 | Ht 65.0 in | Wt 221.0 lb

## 2014-04-02 DIAGNOSIS — I428 Other cardiomyopathies: Secondary | ICD-10-CM

## 2014-04-02 DIAGNOSIS — E118 Type 2 diabetes mellitus with unspecified complications: Secondary | ICD-10-CM

## 2014-04-02 DIAGNOSIS — I429 Cardiomyopathy, unspecified: Secondary | ICD-10-CM

## 2014-04-02 DIAGNOSIS — I10 Essential (primary) hypertension: Secondary | ICD-10-CM

## 2014-04-02 DIAGNOSIS — I5023 Acute on chronic systolic (congestive) heart failure: Secondary | ICD-10-CM

## 2014-04-02 DIAGNOSIS — R0602 Shortness of breath: Secondary | ICD-10-CM

## 2014-04-02 NOTE — Patient Instructions (Signed)
TAKE METOLAZONE 5 MG DAILY 30 MINUTES BEFORE YOUR MORNING DOSE OF LASIX FOR 2 DAYS THEN GO BACK TO THE 2.5 MG DAILY OF METOLAZONE FOR THE 2 DAYS YOU TAKE THE INCREASED DOSE OF METOLAZONE YOU WILL NEED TO TAKE EXTRA POTASSIUM 20 MEQ; AFTER THE 2 DAYS YOU WILL GO BACK TO YOUR REGULAR DOSE OF POTASSIUM   LAB WORK TODAY; BMET, BNP  LAB WORK IN 1 WEEK WHEN YOU SEE DR. Delton See

## 2014-04-02 NOTE — Progress Notes (Signed)
Cardiology Office Note   Date:  04/02/2014   ID:  [name], DOB November 25, 1970, MRN [id number]  PCP:  [name], MD  Cardiologist:  Dr. Tobias Alexander     History of Present Illness: [name] is a 43 y.o. female with a history of chronic systolic CHF in the setting of nonischemic cardiomyopathy, DM2, HL, HTN, obesity s/p gastric bypass surgery, anemia, chronic back pain.  She has a hx of menorrhagia and iron deficiency anemia. She is followed by hematology.  She recent had a uterine ablation and her menorrhagia is resolved.  Last seen by Dr. Delton See 9/15. Patient recently called him with a 13 pound weight gain. She was instructed to increase her Lasix and follow up today.  Patient notes sudden weight gain over one to 2 days' time. She has worsening dyspnea. She is NYHA IIb-3. She notes orthopnea as well as PND. She notes increased abdominal girth. She's not certain if she's had increased LE edema. She has occasional chest pains that are fairly atypical. She's had this in the past without significant change. She denies syncope. She has a nonproductive cough.  Studies:  - LHC (6/15):  No angiographic CAD, EF 35%  - Echo (5/15):  EF 20-25%, diffuse HK, grade 2 diastolic dysfunction, mild LAE  - Nuclear (5/15):  No ischemia, EF 26%  - CPX test (8/15):  Primary limitation related to obesity and related restrictive lung physiology, mild circulatory limitation with blunted blood pressure response to exercise and chronotropic incompetence  Recent Labs/Images:  Recent Labs  11/02/13 1649  03/20/14 0905 03/20/14 0906  NA  --   < >  --  142  K  --   < >  --  4.1  BUN  --   < >  --  18.1  CREATININE  --   < >  --  0.9  ALT 47*  < >  --  30  HGB  --   < > 10.8*  --   TSH 0.82  --   --   --   PROBNP 45.0  --   --   --   < > = values in this interval not displayed.     Wt Readings from Last 3 Encounters:  04/02/14 221 lb (100.245 kg)  03/20/14 226 lb 4.8 oz (102.649  kg)  03/10/14 215 lb (97.523 kg)     Past Medical History  Diagnosis Date  . Asthma   . GERD (gastroesophageal reflux disease)   . Diabetes mellitus   . Hyperlipidemia   . Migraines   . Hypertension   . Back pain, chronic   . Depression   . Neuropathy   . Anxiety   . Nervous breakdown sept 2013  . Fibromyalgia   . History of gastric bypass Jan 2013  . Insomnia   . Wound of left breast   . Nonischemic cardiomyopathy   . Systolic CHF, acute on chronic     CARDIOLOGIST-- DR Aris Lot NELSON  (Corinda Gubler)  . Shortness of breath     with exertion   . Bipolar disorder   . Arthritis     neck and shoulders   . Anemia     hx of iron transfusions - last one 10/2013   . Peripheral vascular disease   . Carpal tunnel syndrome on both sides     Current Outpatient Prescriptions  Medication Sig Dispense Refill  . albuterol (PROVENTIL HFA;VENTOLIN HFA) 108 (90 BASE) MCG/ACT inhaler Inhale 2  puffs into the lungs every 6 (six) hours as needed for wheezing.       Marland Kitchen ALPRAZolam (XANAX) 0.25 MG tablet Take 1 tablet (0.25 mg total) by mouth 2 (two) times daily as needed for anxiety.  60 tablet  0  . aspirin EC 81 MG tablet Take 1 tablet (81 mg total) by mouth daily.  90 tablet  3  . Biotin 5000 MCG TABS Take 5,000 mcg by mouth daily.      Marland Kitchen buPROPion (WELLBUTRIN XL) 300 MG 24 hr tablet Take 300 mg by mouth daily before breakfast.       . BYSTOLIC 2.5 MG tablet TAKE ONE TABLET BY MOUTH ONCE DAILY  30 tablet  3  . cephALEXin (KEFLEX) 500 MG capsule Take 1 capsule (500 mg total) by mouth 4 (four) times daily.  40 capsule  0  . citalopram (CELEXA) 40 MG tablet Take 40 mg by mouth daily before breakfast.       . cyclobenzaprine (FLEXERIL) 10 MG tablet Take 10 mg by mouth 3 (three) times daily as needed for muscle spasms.       . fluticasone (FLONASE) 50 MCG/ACT nasal spray Place 1 spray into both nostrils daily.       . furosemide (LASIX) 40 MG tablet Take 2 tablets (80 mg total) by mouth 2 (two) times  daily.  120 tablet  0  . ibuprofen (ADVIL,MOTRIN) 800 MG tablet Take 800 mg by mouth every 8 (eight) hours as needed (pain).       . Insulin Glargine (LANTUS SOLOSTAR) 100 UNIT/ML Solostar Pen Inject 74 Units into the skin daily.      . insulin glulisine (APIDRA) 100 UNIT/ML injection Inject 5 Units into the skin 3 (three) times daily before meals.      . lidocaine (LIDODERM) 5 % Place 1 patch onto the skin daily as needed (for back pain).       . lubiprostone (AMITIZA) 24 MCG capsule Take 24 mcg by mouth 2 (two) times daily as needed for constipation.      . metolazone (ZAROXOLYN) 2.5 MG tablet Take 1 tablet (2.5 mg total) by mouth daily.  90 tablet  3  . olmesartan (BENICAR) 20 MG tablet Take 20 mg by mouth daily.      Marland Kitchen oxyCODONE-acetaminophen (PERCOCET) 10-325 MG per tablet Take 1 tablet by mouth every 4 (four) hours as needed for pain.  30 tablet  0  . potassium chloride SA (KLOR-CON M20) 20 MEQ tablet Take 1 tablet (20 mEq total) by mouth daily.  90 tablet  3  . pregabalin (LYRICA) 150 MG capsule Take 150 mg by mouth 3 (three) times daily.      . ranitidine (ZANTAC) 150 MG tablet Take 150 mg by mouth daily.      Marland Kitchen tiZANidine (ZANAFLEX) 4 MG tablet Take 4 mg by mouth at bedtime as needed for muscle spasms.       No current facility-administered medications for this visit.     Allergies:   Imitrex   Social History:  The patient  reports that she has never smoked. She has never used smokeless tobacco. She reports that she does not drink alcohol or use illicit drugs.   Family History:  The patient's family history includes Diabetes in her maternal aunt, paternal aunt, paternal grandfather, and paternal grandmother; Hypertension in her paternal grandfather. There is no history of Heart attack or Stroke.   ROS:  Please see the history of present illness.  All other systems reviewed and negative.    PHYSICAL EXAM: VS:  BP 118/80  Pulse 80  Ht 5\' 5"  (1.651 m)  Wt 221 lb (100.245 kg)   BMI 36.78 kg/m2  LMP 01/26/2014 Well nourished, well developed, in no acute distress HEENT: normal Neck: I can not appreciate JVD Cardiac:  normal S1, S2;  RRR; no murmur Lungs:   clear to auscultation bilaterally, no wheezing, rhonchi or rales Abd: distended, nontender, no hepatomegaly Ext:  Trace bilateral LE edema Skin: warm and dry Neuro:  CNs 2-12 intact, no focal abnormalities noted  EKG:  NSR, HR 80, leftward axis, nonspecific ST-T wave changes, QTc 468, no significant change when compared to prior tracings   ASSESSMENT AND PLAN:  1.  Acute on Chronic Systolic CHF:  She has noted a 13 weight gain at home. She has had minimal improvement with the increased dose of Lasix. By our scales, she is up a total of 5 pounds. On exam, she does not look particularly volume overloaded aside from her distended abdomen. I will have her take an extra dose of metolazone for a total of 5 mg x2 days. I will have her take an extra potassium each day. I will check a basic metabolic panel and BNP today. Repeat a basic metabolic panel in one week. She will keep her follow up with Dr. Delton SeeNelson next week. Patient does admit to drinking about 96 ounces of water per day. I have asked her to restrict this to 60 ounces or less per day. She seems to be fairly compliant with a low-sodium diet. 2.  Non-Ischemic Cardiomyopathy:  Continue beta blocker, ARB. Consider the addition of spironolactone in follow up. 3.  Hypertension:  Controlled. 4.  Diabetes Mellitus:  Fair control. Follow up with PCP.  04/12/2013: Hemoglobin-A1c 8.3*    Disposition:   FU with Dr. Delton SeeNelson next week as planned.   Signed, Brynda RimScott Kiron Osmun, PA-C, MHS 04/02/2014 4:34 PM    [facility] [address], [place], [place]  [address] Phone: [phone]; Fax: [phone]

## 2014-04-03 LAB — BASIC METABOLIC PANEL
BUN: 42 mg/dL — ABNORMAL HIGH (ref 6–23)
CALCIUM: 8.9 mg/dL (ref 8.4–10.5)
CO2: 25 mEq/L (ref 19–32)
Chloride: 105 mEq/L (ref 96–112)
Creatinine, Ser: 1.5 mg/dL — ABNORMAL HIGH (ref 0.4–1.2)
GFR: 50.32 mL/min — AB (ref >60.00)
Glucose, Bld: 60 mg/dL — ABNORMAL LOW (ref 70–99)
POTASSIUM: 4.3 meq/L (ref 3.5–5.1)
SODIUM: 140 meq/L (ref 135–145)

## 2014-04-03 LAB — BRAIN NATRIURETIC PEPTIDE: PRO B NATRI PEPTIDE: 52 pg/mL (ref 0.0–100.0)

## 2014-04-04 ENCOUNTER — Ambulatory Visit (HOSPITAL_COMMUNITY): Payer: Commercial Managed Care - HMO

## 2014-04-04 ENCOUNTER — Telehealth: Payer: Self-pay | Admitting: *Deleted

## 2014-04-04 DIAGNOSIS — I5031 Acute diastolic (congestive) heart failure: Secondary | ICD-10-CM

## 2014-04-04 MED ORDER — METOLAZONE 2.5 MG PO TABS: 2.5000 mg | ORAL_TABLET | Freq: Every day | ORAL | Status: DC

## 2014-04-04 MED ORDER — FUROSEMIDE 40 MG PO TABS: 40.0000 mg | ORAL_TABLET | Freq: Two times a day (BID) | ORAL | Status: DC

## 2014-04-04 NOTE — Telephone Encounter (Signed)
pt notified about lab results and to decrease lasix to 40 mg bid, HOLD metolazone. BMET 10/26. Pt verbalized understanding to Plan of care.

## 2014-04-06 ENCOUNTER — Ambulatory Visit (HOSPITAL_COMMUNITY): Payer: Commercial Managed Care - HMO

## 2014-04-09 ENCOUNTER — Other Ambulatory Visit (INDEPENDENT_AMBULATORY_CARE_PROVIDER_SITE_OTHER): Payer: Commercial Managed Care - HMO | Admitting: *Deleted

## 2014-04-09 ENCOUNTER — Ambulatory Visit (INDEPENDENT_AMBULATORY_CARE_PROVIDER_SITE_OTHER): Payer: Commercial Managed Care - HMO | Admitting: Cardiology

## 2014-04-09 ENCOUNTER — Telehealth: Payer: Self-pay | Admitting: *Deleted

## 2014-04-09 ENCOUNTER — Ambulatory Visit (HOSPITAL_COMMUNITY): Payer: Commercial Managed Care - HMO

## 2014-04-09 ENCOUNTER — Encounter: Payer: Self-pay | Admitting: Cardiology

## 2014-04-09 ENCOUNTER — Telehealth: Payer: Self-pay | Admitting: Cardiology

## 2014-04-09 VITALS — BP 102/52 | HR 88 | Ht 65.0 in | Wt 219.0 lb

## 2014-04-09 DIAGNOSIS — D649 Anemia, unspecified: Secondary | ICD-10-CM

## 2014-04-09 DIAGNOSIS — I5022 Chronic systolic (congestive) heart failure: Secondary | ICD-10-CM

## 2014-04-09 DIAGNOSIS — I5031 Acute diastolic (congestive) heart failure: Secondary | ICD-10-CM

## 2014-04-09 DIAGNOSIS — R531 Weakness: Secondary | ICD-10-CM

## 2014-04-09 LAB — BASIC METABOLIC PANEL
BUN: 59 mg/dL — ABNORMAL HIGH (ref 6–23)
CO2: 26 mEq/L (ref 19–32)
Calcium: 9 mg/dL (ref 8.4–10.5)
Chloride: 100 mEq/L (ref 96–112)
Creatinine, Ser: 1.7 mg/dL — ABNORMAL HIGH (ref 0.4–1.2)
GFR: 43.39 mL/min — AB (ref >60.00)
Glucose, Bld: 206 mg/dL — ABNORMAL HIGH (ref 70–99)
POTASSIUM: 4.1 meq/L (ref 3.5–5.1)
SODIUM: 136 meq/L (ref 135–145)

## 2014-04-09 MED ORDER — IVABRADINE HCL 5 MG PO TABS: 5.0000 mg | ORAL_TABLET | Freq: Two times a day (BID) | ORAL | Status: DC

## 2014-04-09 MED ORDER — POTASSIUM CHLORIDE CRYS ER 20 MEQ PO TBCR: 20.0000 meq | EXTENDED_RELEASE_TABLET | Freq: Every day | ORAL | Status: DC

## 2014-04-09 MED ORDER — OLMESARTAN MEDOXOMIL 20 MG PO TABS: 20.0000 mg | ORAL_TABLET | Freq: Every day | ORAL | Status: DC

## 2014-04-09 MED ORDER — FUROSEMIDE 40 MG PO TABS: 40.0000 mg | ORAL_TABLET | Freq: Two times a day (BID) | ORAL | Status: DC

## 2014-04-09 NOTE — Telephone Encounter (Signed)
pt notified about lab results and to hold lasix, K+ and benicar, BMET 10/29. Pt verbalized understanding to Plan of care and instructions

## 2014-04-09 NOTE — Telephone Encounter (Signed)
Informed the pt that Dr Delton See has filled out a form for Corlanor 5 mg to help in assisting financially for this pt to get this med on a regular basis.  Informed the pt that we gave her samples for this med at her OV with Dr Delton See today and that will get her by until the all the paper work is complete.  Informed the pt that I will mail the paper work for the corlanor to her confirmed address on file and she is to complete this and bring back to her next OV in 3 weeks with Dr Delton See.  Pt verbalized understanding, agrees with this plan and pleased with all the assistance provided.

## 2014-04-09 NOTE — Telephone Encounter (Signed)
New message     Pt said corlanor 5mg  is too expensive.  Is there something else Dr Delton See can prescribe?

## 2014-04-09 NOTE — Progress Notes (Signed)
Patient ID: [name], female   DOB: 1970/08/30, 43 y.o.   MRN: [id number]     Cardiology Office Note   Date:  04/09/2014   ID:  [name], DOB 03-31-1971, MRN [id number]  PCP:  [name], MD  Cardiologist:  Dr. Tobias Alexander     History of Present Illness: [name] is a 43 y.o. female with a history of chronic systolic CHF in the setting of nonischemic cardiomyopathy, DM2, HL, HTN, obesity s/p gastric bypass surgery, anemia, chronic back pain.  She has a hx of menorrhagia and iron deficiency anemia. She is followed by hematology.  She recent had a uterine ablation and her menorrhagia is resolved.  Last seen by Dr. Delton See 9/15. Patient recently called him with a 13 pound weight gain. She was instructed to increase her Lasix and follow up today.  Patient notes sudden weight gain over one to 2 days' time. She has worsening dyspnea. She is NYHA IIb-3. She notes orthopnea as well as PND. She notes increased abdominal girth. She's not certain if she's had increased LE edema. She has occasional chest pains that are fairly atypical. She's had this in the past without significant change. She denies syncope. She has a nonproductive cough.  04/09/2014 - the patient is coming after 10 days, she has lost 7 lbs but continues to be SOB with minimal activity, having increased abdominal girth and constant fatigue. She feels depressed. No CP< no palpitations or syncope. S/P spinal stimulator implantation in September.   Studies:  - LHC (6/15):  No angiographic CAD, EF 35%  - Echo (5/15):  EF 20-25%, diffuse HK, grade 2 diastolic dysfunction, mild LAE  - Nuclear (5/15):  No ischemia, EF 26%  - CPX test (8/15):  Primary limitation related to obesity and related restrictive lung physiology, mild circulatory limitation with blunted blood pressure response to exercise and chronotropic incompetence  Recent Labs/Images:  Recent Labs  11/02/13 1649  03/20/14 0905 03/20/14 0906  04/02/14 1706  NA  --   < >  --  142 140  K  --   < >  --  4.1 4.3  BUN  --   < >  --  18.1 42*  CREATININE  --   < >  --  0.9 1.5*  ALT 47*  < >  --  30  --   HGB  --   < > 10.8*  --   --   TSH 0.82  --   --   --   --   PROBNP 45.0  --   --   --  52.0  < > = values in this interval not displayed.     Wt Readings from Last 3 Encounters:  04/09/14 219 lb (99.338 kg)  04/02/14 221 lb (100.245 kg)  03/20/14 226 lb 4.8 oz (102.649 kg)     Past Medical History  Diagnosis Date  . Asthma   . GERD (gastroesophageal reflux disease)   . Diabetes mellitus   . Hyperlipidemia   . Migraines   . Hypertension   . Back pain, chronic   . Depression   . Neuropathy   . Anxiety   . Nervous breakdown sept 2013  . Fibromyalgia   . History of gastric bypass Jan 2013  . Insomnia   . Wound of left breast   . Nonischemic cardiomyopathy   . Systolic CHF, acute on chronic     CARDIOLOGIST-- DR Aris Lot Jeziel Hoffmann  ([facility])  .  Shortness of breath     with exertion   . Bipolar disorder   . Arthritis     neck and shoulders   . Anemia     hx of iron transfusions - last one 10/2013   . Peripheral vascular disease   . Carpal tunnel syndrome on both sides     Current Outpatient Prescriptions  Medication Sig Dispense Refill  . albuterol (PROVENTIL HFA;VENTOLIN HFA) 108 (90 BASE) MCG/ACT inhaler Inhale 2 puffs into the lungs every 6 (six) hours as needed for wheezing.       Marland Kitchen. ALPRAZolam (XANAX) 0.25 MG tablet Take 1 tablet (0.25 mg total) by mouth 2 (two) times daily as needed for anxiety.  60 tablet  0  . aspirin EC 81 MG tablet Take 1 tablet (81 mg total) by mouth daily.  90 tablet  3  . Biotin 5000 MCG TABS Take 5,000 mcg by mouth daily.      Marland Kitchen. buPROPion (WELLBUTRIN XL) 300 MG 24 hr tablet Take 300 mg by mouth daily before breakfast.       . BYSTOLIC 2.5 MG tablet TAKE ONE TABLET BY MOUTH ONCE DAILY  30 tablet  3  . cephALEXin (KEFLEX) 500 MG capsule Take 1 capsule (500 mg total) by mouth 4  (four) times daily.  40 capsule  0  . citalopram (CELEXA) 40 MG tablet Take 40 mg by mouth daily before breakfast.       . cyclobenzaprine (FLEXERIL) 10 MG tablet Take 10 mg by mouth 3 (three) times daily as needed for muscle spasms.       . fluticasone (FLONASE) 50 MCG/ACT nasal spray Place 1 spray into both nostrils daily.       . furosemide (LASIX) 40 MG tablet Take 1 tablet (40 mg total) by mouth 2 (two) times daily.      Marland Kitchen. ibuprofen (ADVIL,MOTRIN) 800 MG tablet Take 800 mg by mouth every 8 (eight) hours as needed (pain).       . Insulin Glargine (LANTUS SOLOSTAR) 100 UNIT/ML Solostar Pen Inject 74 Units into the skin daily.      . insulin glulisine (APIDRA) 100 UNIT/ML injection Inject 5 Units into the skin 3 (three) times daily before meals.      . lidocaine (LIDODERM) 5 % Place 1 patch onto the skin daily as needed (for back pain).       . lubiprostone (AMITIZA) 24 MCG capsule Take 24 mcg by mouth 2 (two) times daily as needed for constipation.      . metolazone (ZAROXOLYN) 2.5 MG tablet Take 1 tablet (2.5 mg total) by mouth daily. HOLD PER SCOTT WEAVER, [facility] 04/04/14      . olmesartan (BENICAR) 20 MG tablet Take 20 mg by mouth daily.      Marland Kitchen. oxyCODONE-acetaminophen (PERCOCET) 10-325 MG per tablet Take 1 tablet by mouth every 4 (four) hours as needed for pain.  30 tablet  0  . potassium chloride SA (KLOR-CON M20) 20 MEQ tablet Take 1 tablet (20 mEq total) by mouth daily.  90 tablet  3  . pregabalin (LYRICA) 150 MG capsule Take 150 mg by mouth 3 (three) times daily.      . ranitidine (ZANTAC) 150 MG tablet Take 150 mg by mouth daily.      Marland Kitchen. tiZANidine (ZANAFLEX) 4 MG tablet Take 4 mg by mouth at bedtime as needed for muscle spasms.       No current facility-administered medications for this visit.  Allergies:   Imitrex   Social History:  The patient  reports that she has never smoked. She has never used smokeless tobacco. She reports that she does not drink alcohol or use illicit  drugs.   Family History:  The patient's family history includes Diabetes in her maternal aunt, paternal aunt, paternal grandfather, and paternal grandmother; Hypertension in her paternal grandfather. There is no history of Heart attack or Stroke.   ROS:  Please see the history of present illness.       All other systems reviewed and negative.    PHYSICAL EXAM: VS:  BP 102/52  Pulse 88  Ht 5\' 5"  (1.651 m)  Wt 219 lb (99.338 kg)  BMI 36.44 kg/m2  LMP 01/26/2014 Well nourished, well developed, in no acute distress HEENT: normal Neck: I can not appreciate JVD Cardiac:  normal S1, S2;  RRR; no murmur Lungs:   clear to auscultation bilaterally, no wheezing, rhonchi or rales Abd: distended, nontender, no hepatomegaly Ext:  Trace bilateral LE edema Skin: warm and dry Neuro:  CNs 2-12 intact, no focal abnormalities noted  EKG:  NSR, HR 80, leftward axis, nonspecific ST-T wave changes, QTc 468, no significant change when compared to prior tracings     ASSESSMENT AND PLAN:  1.  Acute on Chronic Systolic CHF:  She has lost 7 lbs, now 219, baseline 216 lbs, she doesn't seem to be overly fluid overloaded. We will check BMP today, the last Crea 1.5, baseline 0.8. Ongoing NYHA III, there might be a component of deconditioning. I will refer her to rehab once recovered from spinal surgery. Continue the same dose diuretics, alter based on BMP results.   2.  Non-Ischemic Cardiomyopathy:  Continue beta blocker, ARB. Consider the addition of spironolactone in follow up. Still HR > 70, unable to tolerate more BB, we will start ivabradine 5 mg po BID.   3.  Hypertension:  Controlled.   4.  Diabetes Mellitus:  Fair control. Follow up with PCP.  04/12/2013: Hemoglobin-A1c 8.3.  5. Depression - on high doses celexa and wellbutrin, referred to PCP or psychiatry as she would need tapering down before starting a new antidepressants/antipsychotic med.  Follow up in 2-3 weeks. If no improvement we will  refer to advanced HF.  Lars Masson 04/09/2014

## 2014-04-09 NOTE — Patient Instructions (Signed)
Your physician has recommended you make the following change in your medication:   START TAKING CORLANOR 5 MG TWICE DAILY   Your physician recommends that you return for lab work in: TODAY (BMET)   Your physician recommends that you schedule a follow-up appointment in: 3 WEEKS WITH DR Delton See

## 2014-04-11 ENCOUNTER — Ambulatory Visit (HOSPITAL_COMMUNITY): Payer: Commercial Managed Care - HMO

## 2014-04-12 ENCOUNTER — Other Ambulatory Visit (INDEPENDENT_AMBULATORY_CARE_PROVIDER_SITE_OTHER): Payer: Commercial Managed Care - HMO | Admitting: *Deleted

## 2014-04-12 DIAGNOSIS — I5022 Chronic systolic (congestive) heart failure: Secondary | ICD-10-CM

## 2014-04-12 LAB — BASIC METABOLIC PANEL
BUN: 62 mg/dL — ABNORMAL HIGH (ref 6–23)
CALCIUM: 9.1 mg/dL (ref 8.4–10.5)
CHLORIDE: 101 meq/L (ref 96–112)
CO2: 24 meq/L (ref 19–32)
Creatinine, Ser: 1.5 mg/dL — ABNORMAL HIGH (ref 0.4–1.2)
GFR: 49.53 mL/min — ABNORMAL LOW (ref >60.00)
GLUCOSE: 164 mg/dL — AB (ref 70–99)
POTASSIUM: 3.4 meq/L — AB (ref 3.5–5.1)
Sodium: 137 mEq/L (ref 135–145)

## 2014-04-13 ENCOUNTER — Other Ambulatory Visit: Payer: Self-pay | Admitting: *Deleted

## 2014-04-13 ENCOUNTER — Ambulatory Visit (HOSPITAL_COMMUNITY): Payer: Commercial Managed Care - HMO

## 2014-04-13 ENCOUNTER — Telehealth: Payer: Self-pay | Admitting: Cardiology

## 2014-04-13 DIAGNOSIS — D62 Acute posthemorrhagic anemia: Secondary | ICD-10-CM

## 2014-04-13 DIAGNOSIS — N289 Disorder of kidney and ureter, unspecified: Secondary | ICD-10-CM

## 2014-04-13 NOTE — Telephone Encounter (Signed)
Spoke with pt is aware of lab results and PA's recommendations.Pt is to take Potassium 40 mEq today on time only and to continue to hold Benicar, Lasix and Metolazone. Pt to increased the Water intake for the next 24 to 48 hours. Pt to weight every day. Pt to call the office if she has gained 3 lbs in a day, and increased in swelling and increased dyspnea. Pt to have blood work on Monday 11/2 BMET, CBC. Pt is aware, she verbalized understandind.

## 2014-04-13 NOTE — Telephone Encounter (Signed)
New problem   Pt was told to call and ask to speak to TRIAGE. Please call pt,

## 2014-04-16 ENCOUNTER — Ambulatory Visit (HOSPITAL_COMMUNITY): Payer: Commercial Managed Care - HMO

## 2014-04-16 ENCOUNTER — Encounter: Payer: Self-pay | Admitting: Hematology

## 2014-04-16 ENCOUNTER — Other Ambulatory Visit: Payer: Commercial Managed Care - HMO

## 2014-04-16 ENCOUNTER — Other Ambulatory Visit (HOSPITAL_BASED_OUTPATIENT_CLINIC_OR_DEPARTMENT_OTHER): Payer: Commercial Managed Care - HMO

## 2014-04-16 ENCOUNTER — Telehealth: Payer: Self-pay | Admitting: Hematology

## 2014-04-16 ENCOUNTER — Ambulatory Visit (HOSPITAL_BASED_OUTPATIENT_CLINIC_OR_DEPARTMENT_OTHER): Payer: Commercial Managed Care - HMO | Admitting: Hematology

## 2014-04-16 VITALS — BP 129/80 | HR 69 | Temp 98.3°F | Resp 18

## 2014-04-16 DIAGNOSIS — D500 Iron deficiency anemia secondary to blood loss (chronic): Secondary | ICD-10-CM

## 2014-04-16 DIAGNOSIS — N920 Excessive and frequent menstruation with regular cycle: Secondary | ICD-10-CM

## 2014-04-16 DIAGNOSIS — D539 Nutritional anemia, unspecified: Secondary | ICD-10-CM

## 2014-04-16 DIAGNOSIS — I5023 Acute on chronic systolic (congestive) heart failure: Secondary | ICD-10-CM

## 2014-04-16 DIAGNOSIS — D509 Iron deficiency anemia, unspecified: Secondary | ICD-10-CM

## 2014-04-16 LAB — BASIC METABOLIC PANEL (CC13)
Anion Gap: 12 mEq/L — ABNORMAL HIGH (ref 3–11)
BUN: 36.9 mg/dL — AB (ref 7.0–26.0)
CHLORIDE: 98 meq/L (ref 98–109)
CO2: 29 meq/L (ref 22–29)
CREATININE: 1.3 mg/dL — AB (ref 0.6–1.1)
Calcium: 9.7 mg/dL (ref 8.4–10.4)
GLUCOSE: 204 mg/dL — AB (ref 70–140)
POTASSIUM: 3.2 meq/L — AB (ref 3.5–5.1)
Sodium: 139 mEq/L (ref 136–145)

## 2014-04-16 LAB — CBC WITH DIFFERENTIAL/PLATELET
BASO%: 0.7 % (ref 0.0–2.0)
BASOS ABS: 0.1 10*3/uL (ref 0.0–0.1)
EOS%: 2.4 % (ref 0.0–7.0)
Eosinophils Absolute: 0.3 10*3/uL (ref 0.0–0.5)
HCT: 36.8 % (ref 34.8–46.6)
HEMOGLOBIN: 12.1 g/dL (ref 11.6–15.9)
LYMPH%: 20.9 % (ref 14.0–49.7)
MCH: 30 pg (ref 25.1–34.0)
MCHC: 33 g/dL (ref 31.5–36.0)
MCV: 91 fL (ref 79.5–101.0)
MONO#: 0.6 10*3/uL (ref 0.1–0.9)
MONO%: 5 % (ref 0.0–14.0)
NEUT%: 71 % (ref 38.4–76.8)
NEUTROS ABS: 7.7 10*3/uL — AB (ref 1.5–6.5)
Platelets: 245 10*3/uL (ref 145–400)
RBC: 4.04 10*6/uL (ref 3.70–5.45)
RDW: 13.8 % (ref 11.2–14.5)
WBC: 10.9 10*3/uL — AB (ref 3.9–10.3)
lymph#: 2.3 10*3/uL (ref 0.9–3.3)

## 2014-04-16 LAB — IRON AND TIBC CHCC
%SAT: 39 % (ref 21–57)
IRON: 123 ug/dL (ref 41–142)
TIBC: 316 ug/dL (ref 236–444)
UIBC: 194 ug/dL (ref 120–384)

## 2014-04-16 LAB — TSH CHCC: TSH: 1.334 m(IU)/L (ref 0.308–3.960)

## 2014-04-16 LAB — HOLD TUBE, BLOOD BANK

## 2014-04-16 LAB — FERRITIN CHCC: FERRITIN: 578 ng/mL — AB (ref 9–269)

## 2014-04-16 NOTE — Telephone Encounter (Signed)
gv adn printed appt sched and avs for pt for Dec °

## 2014-04-16 NOTE — Progress Notes (Signed)
[facility] PROGRESS NOTE Date of visit: 04/16/2014  [name], MD Dr. Cristela Blue [place] [address]  DIAGNOSIS: Iron deficiency anemia - Plan: CBC with Differential, Hold Tube, Blood Bank, Ferritin, Iron and TIBC [facility]  Chief Complaint  Patient presents with  . Follow-up    CURRENT TREATMENT:  Feraheme 1,020 mg given 03/20/2014  INTERVAL HISTORY:  [name] 43 y.o. female with a history of IDA secondary heavy menses and also a history of gastric bypass is here for follow up.  She was last seen on 03/20/2014 by Dr Juliann Mule.  Her hemoglobin was 7.9 and she received feraheme 1,020 mg.   She reports increased energy.  She is having difficulties sleeping related to her recent diagnosis of CHF.  She reports an extended family history of CHF. She was started on coreg and lasix.  Her ace inhibitor was discontinued due to cough.  She is now on benicar.   Of note, she presented [facility] emergency room on 10/01/2013 with dyspnea and low energy. At that time, her hemoglobin was in the high 7s. She was recommended for hematology referral because the patient was taking her iron supplement as recommended for the past several months. She was taking ferrous sulfate 325 mg tid with orange juice per her PCP. At the emergency room her hemoglobin was 8.4, and she was not transfused. Her FOBT test was negative. Liver function tests were elevated (AST 136, ALT 66, Alk phos 203) and pro-BNP was high at 1711. CXR showed cardiomegaly, mild vascular congestion, small bilateral effusions, possibly early CHF.  Her lasix has increased from 40 mg daily to 80 mg daily due to lower extremity swelling. She was discharged with follow up with cardiology and hematology. She saw Dr. Lisbeth Ply on 10/03/2013.   She reports having surgery on 12/14/2013 by Dr. Migdalia Dk for left breast infection/abscess. It started a small bump that increased.  She had  preparation of left breast wound for placement of Acell (500 mg powder and 7 x 10 cm sheet) with excision of skin and subcutaneous tissue. Primary closure after undermining. She had a prior admission on 11/27/2013 for left breast abscess s/p I and D and excisional biopsy of skin and subcutaneous tissue  By Dr. Harlow Asa with pathology consistent with abscess. She is going back and forward to wound care and she reports draining.   She denies fevers. She reports improvement in her pain. She is on doxycycline 100 mg bid.    She she does not eat in her fruits and vegetables because of the concern of hyperglycemia. She does complain of trouble at nighttime vision and for that reason she does not drive at night time and she may have vitamin C as well as a vitamin A deficiency as possible causes of the symptoms do to her gastric bypass. She also have congestive heart failure at a young age.  INTERVAL HISTORY:  Patient is denying any shortness of breath cough chest pain or pleurisy. Her energy level has improved since she got the iron infusion. Today while she is getting blood work done she had a vasovagal episode and felt little dizzy but did not pass out. We did an extensive testing for anemia today and results are pending.  MEDICAL HISTORY: Past Medical History  Diagnosis Date  . Asthma   . GERD (gastroesophageal reflux disease)   . Diabetes mellitus   . Hyperlipidemia   . Migraines   . Hypertension   .  Back pain, chronic   . Depression   . Neuropathy   . Anxiety   . Nervous breakdown sept 2013  . Fibromyalgia   . History of gastric bypass Jan 2013  . Insomnia   . Wound of left breast   . Nonischemic cardiomyopathy   . Systolic CHF, acute on chronic     CARDIOLOGIST-- DR Houston Siren NELSON  ([facility])  . Shortness of breath     with exertion   . Bipolar disorder   . Arthritis     neck and shoulders   . Anemia     hx of iron transfusions - last one 10/2013   . Peripheral vascular disease   .  Carpal tunnel syndrome on both sides     INTERIM HISTORY: has Morbid obesity; IDDM (insulin dependent diabetes mellitus); Degenerative disc disease; Nausea and vomiting; Major depressive disorder, recurrent episode, moderate; Compulsive behavior disorder; History of gastric bypass; Anxiety disorder; Nervous breakdown; Hematochezia; Anemia; DM type 2 (diabetes mellitus, type 2); Generalized weakness; Generalized abdominal pain; Hyperactive bowel sounds; Dehydration; Insomnia; Diabetic neuropathy; Migraine, unspecified, without mention of intractable migraine without mention of status migrainosus; History of iron deficiency anemia; Chronic systolic heart failure; Left breast abscess; Breast injury; and Open breast wound on her problem list.    ALLERGIES:  is allergic to imitrex.  MEDICATIONS: has a current medication list which includes the following prescription(s): albuterol, alprazolam, aspirin ec, biotin, bupropion, bystolic, cephalexin, citalopram, cyclobenzaprine, fluticasone, furosemide, ibuprofen, insulin glargine, insulin glulisine, ivabradine hcl, lidocaine, lubiprostone, metolazone, olmesartan, oxycodone-acetaminophen, potassium chloride sa, pregabalin, ranitidine, and tizanidine.  SURGICAL HISTORY:  Past Surgical History  Procedure Laterality Date  . Back surgery  November 1992  . Cesarean section  December 1998  . Tubal ligation  January 1999  . Cholecystectomy  1998  . Gastric roux-en-y  06/30/2011    Procedure: LAPAROSCOPIC ROUX-EN-Y GASTRIC;  Surgeon: Edward Jolly, MD;  Location: [facility];  Service: General;  Laterality: N/A;     . Esophagogastroduodenoscopy  07/27/2011    Procedure: ESOPHAGOGASTRODUODENOSCOPY (EGD);  Surgeon: Landry Dyke, MD;  Location: [facility];  Service: Endoscopy;  Laterality: N/A;  . Irrigation and debridement abscess Left 11/26/2013    Procedure: IRRIGATION AND DEBRIDEMENT ABSCESS;  Surgeon: Earnstine Regal, MD;  Location: [facility];  Service: General;   Laterality: Left;  . Cardiac catheterization  11-14-2013  [facility]    NORMAL LV FILLING PRESSURE W/ MILD ELEVATED RV FILLING, POSSIBLY SUFFESTING RV FAILURE OUT OF PROPORTION TO LV FAILURE. EF ABOUT 35% WITH DIFFUSE HYPOKINESIS. NO ANGIOGRAPHIC CAD, NONISCHEMIC CARDIOMYOPATHY  . Incision and drainage of wound Left 12/14/2013    Procedure: IRRIGATION AND DEBRIDEMENT OF LEFT BREAST WOUND WITH PLACEMENT OF A-CELL, WITH CLOSURE ;  Surgeon: Theodoro Kos, DO;  Location: [facility];  Service: Plastics;  Laterality: Left;  . Dilitation & currettage/hystroscopy with novasure ablation N/A 02/26/2014    Procedure: DILATATION & CURETTAGE/HYSTEROSCOPY WITH NOVASURE ABLATION;  Surgeon: Margarette Asal, MD;  Location: [facility];  Service: Gynecology;  Laterality: N/A;  . Breast surgery Left     I&D for abcsess   . Spinal cord stimulator insertion N/A 03/09/2014    Procedure: LUMBAR SPINAL CORD STIMULATOR INSERTION;  Surgeon: Bonna Gains, MD;  Location: [facility];  Service: Neurosurgery;  Laterality: N/A;    REVIEW OF SYSTEMS:   Constitutional: Denies fevers, chills or abnormal weight loss; Positive fatigue Eyes: Denies blurriness of vision Ears, nose, mouth, throat, and face: Denies mucositis or sore  throat Respiratory: Denies cough, dyspnea or wheezes Cardiovascular: Denies palpitation, chest discomfort or lower extremity swelling Gastrointestinal:  Denies nausea, heartburn or change in bowel habits Skin: Denies abnormal skin rashes Lymphatics: Denies new lymphadenopathy or easy bruising Neurological:Denies numbness, tingling or new weaknesses Behavioral/Psych: Mood is stable, no new changes  All other systems were reviewed with the patient and are negative.  PHYSICAL EXAMINATION: ECOG PERFORMANCE STATUS: 0  Blood pressure 129/80, pulse 69, temperature 98.3 F (36.8 C), temperature source Oral, resp. rate 18, SpO2 98 %.  GENERAL:alert, no distress and comfortable; moderately obese SKIN: skin  color, texture, turgor are normal, no rashes or significant lesions; multiple scarred lesion on lower extremity.  EYES: normal, Conjunctiva are pink and non-injected, sclera clear OROPHARYNX:no exudate, no erythema and lips, buccal mucosa, and tongue normal  NECK: supple, thyroid normal size, non-tender, without nodularity LYMPH:  no palpable lymphadenopathy in the cervical, axillary or supraclavicular BREASTS: No examined.  LUNGS: clear to auscultation with normal breathing effort, no wheezes or rhonchi HEART: regular rate & rhythm and no murmurs and no lower extremity edema ABDOMEN:abdomen soft, non-tender and normal bowel sounds Musculoskeletal:no cyanosis of digits and no clubbing  NEURO: alert & oriented x 3 with fluent speech, no focal motor/sensory deficits  Labs:  Lab Results  Component Value Date   WBC 10.9* 04/16/2014   HGB 12.1 04/16/2014   HCT 36.8 04/16/2014   MCV 91.0 04/16/2014   PLT 245 04/16/2014   NEUTROABS 7.7* 04/16/2014      Chemistry      Component Value Date/Time   NA 139 04/16/2014 0906   NA 137 04/12/2014 0813   NA 141 04/12/2013 1026   K 3.2* 04/16/2014 0906   K 3.4* 04/12/2014 0813   CL 101 04/12/2014 0813   CO2 29 04/16/2014 0906   CO2 24 04/12/2014 0813   BUN 36.9* 04/16/2014 0906   BUN 62* 04/12/2014 0813   BUN 12 04/12/2013 1026   CREATININE 1.3* 04/16/2014 0906   CREATININE 1.5* 04/12/2014 0813   CREATININE 0.76 03/20/2011 1302      Component Value Date/Time   CALCIUM 9.7 04/16/2014 0906   CALCIUM 9.1 04/12/2014 0813   ALKPHOS 151* 03/20/2014 0906   ALKPHOS 197* 03/08/2014 1231   AST 24 03/20/2014 0906   AST 87* 03/08/2014 1231   ALT 30 03/20/2014 0906   ALT 55* 03/08/2014 1231   BILITOT 0.29 03/20/2014 0906   BILITOT 0.3 03/08/2014 1231        CBC:  Recent Labs Lab 04/16/14 0859  WBC 10.9*  NEUTROABS 7.7*  HGB 12.1  HCT 36.8  MCV 91.0  PLT 245    Anemia work up  Recent Labs  04/16/14 0859 04/16/14 0900   VITAMINB12  --  394  FOLATE  --  14.6  FERRITIN 578*  --   TIBC 316  --   IRON 123  --     Studies:  No results found.   RADIOGRAPHIC STUDIES: None   ASSESSMENT: Leodis Sias 43 y.o. female with a history of Iron deficiency anemia - Plan: CBC with Differential, Hold Tube, Blood Bank, Ferritin, Iron and TIBC [facility]   PLAN:   1.  H.o. severe IDA secondary to #2.  --Previously, her iron indices demonstrated iron level of 67 and ferritin of 33. Her hemoglobin is 10.8 with MCV of 91. She has two factor contributing to iron deficiency secondary to menorrhagia and/or s/p gastric bypass surgery. GI workup including FOBT was negative. We gave her  feraheme 1,020 mg intravenously  On 04/30 and another infusion was given 03/20/2014. She was also started on B12 1000 mcg IM once a month.   -- I also suspect that because of her gastric bypass, her dietary history and her symptoms she may have other etiologies to explain her anemia as well so I ordered different tests drawn today including copper,zinc, selenium, b12, folate, Vitamin C, Vitamin A, SPEP, Ceruloplasmin, Vitamin D, iron studies.  2. Menorrhagia.  -- to follow with Gynecology.   4. Early CHF.   --Patient is following with [facility]. Check selenium level as it can also cause CHF if deficient.  5. DM2 -- Continue management per PCP.     6. Poor Night Vision:  -- Check Vitamin A as deficiency leads to Nyctalopia.  6. Follow up.  --She will follow up with CBC and iron studies in 6 weeksh and to go over results of other tests we did today.    All questions were answered. The patient knows to call the clinic with any problems, questions or concerns. We can certainly see the patient much sooner if necessary.  I spent 10 minutes counseling the patient face to face. The total time spent in the appointment was 15 minutes.    Bernadene Bell, MD Medical Hematologist/Oncologist [place] Pager:  [phone] Office No: [phone]

## 2014-04-18 ENCOUNTER — Ambulatory Visit (HOSPITAL_COMMUNITY): Payer: Commercial Managed Care - HMO

## 2014-04-20 ENCOUNTER — Ambulatory Visit (HOSPITAL_COMMUNITY): Payer: Commercial Managed Care - HMO

## 2014-04-20 LAB — PROTEIN ELECTROPHORESIS, SERUM
ALBUMIN ELP: 52.5 % — AB (ref 55.8–66.1)
ALPHA-1-GLOBULIN: 4.9 % (ref 2.9–4.9)
Alpha-2-Globulin: 12.2 % — ABNORMAL HIGH (ref 7.1–11.8)
BETA GLOBULIN: 6.3 % (ref 4.7–7.2)
Beta 2: 7.8 % — ABNORMAL HIGH (ref 3.2–6.5)
Gamma Globulin: 16.3 % (ref 11.1–18.8)
Total Protein, Serum Electrophoresis: 7.4 g/dL (ref 6.0–8.3)

## 2014-04-20 LAB — COPPER, SERUM: COPPER: 115 ug/dL (ref 70–175)

## 2014-04-20 LAB — VITAMIN B12: Vitamin B-12: 394 pg/mL (ref 211–911)

## 2014-04-20 LAB — FOLATE: Folate: 14.6 ng/mL

## 2014-04-20 LAB — VITAMIN D 25 HYDROXY (VIT D DEFICIENCY, FRACTURES)

## 2014-04-20 LAB — VITAMIN A: VITAMIN A (RETINOIC ACID): 60 ug/dL (ref 38–98)

## 2014-04-20 LAB — ZINC: Zinc: 66 ug/dL (ref 60–130)

## 2014-04-20 LAB — SELENIUM SERUM: Selenium, Blood: 147 mcg/L (ref 63–160)

## 2014-04-20 LAB — VITAMIN C: VITAMIN C: 0.6 mg/dL (ref 0.2–1.5)

## 2014-04-20 LAB — CERULOPLASMIN: Ceruloplasmin: 33 mg/dL (ref 18–53)

## 2014-04-23 ENCOUNTER — Ambulatory Visit (HOSPITAL_COMMUNITY): Payer: Commercial Managed Care - HMO

## 2014-04-24 ENCOUNTER — Telehealth: Payer: Self-pay | Admitting: *Deleted

## 2014-04-24 DIAGNOSIS — I5022 Chronic systolic (congestive) heart failure: Secondary | ICD-10-CM

## 2014-04-24 NOTE — Telephone Encounter (Signed)
pt states she forgot about the lab for 11/2, see phone note from Adele Schilder. RN on 10/30. Pt will come in 11/12 for bmet, cbc .

## 2014-04-25 ENCOUNTER — Ambulatory Visit (HOSPITAL_COMMUNITY): Payer: Commercial Managed Care - HMO

## 2014-04-25 ENCOUNTER — Telehealth: Payer: Self-pay | Admitting: Cardiology

## 2014-04-25 NOTE — Telephone Encounter (Signed)
Contacted the pt to make her aware that I will leave her 2 samples of corlanor 5 mg at the front desk for her to pick-up tomorrow at her lab appt 04/26/14 at our office.  Informed the pt that we are currently working on her prior authorization for this medication to get covered by her insurance.  Informed the pt that Dr Delton See is out of the office and when she returns the papers will be completed for authorization.  Pt verbalized understanding and very gracious for all the assistance provided.

## 2014-04-25 NOTE — Telephone Encounter (Signed)
New message     Pt is out of samples of corlanor.  Will she need to continue taking the medication or what do you want her to do?

## 2014-04-26 ENCOUNTER — Other Ambulatory Visit (INDEPENDENT_AMBULATORY_CARE_PROVIDER_SITE_OTHER): Payer: Commercial Managed Care - HMO | Admitting: *Deleted

## 2014-04-26 ENCOUNTER — Telehealth: Payer: Self-pay | Admitting: Cardiology

## 2014-04-26 ENCOUNTER — Telehealth: Payer: Self-pay | Admitting: *Deleted

## 2014-04-26 DIAGNOSIS — I5022 Chronic systolic (congestive) heart failure: Secondary | ICD-10-CM

## 2014-04-26 LAB — CBC WITH DIFFERENTIAL/PLATELET
Basophils Absolute: 0 10*3/uL (ref 0.0–0.1)
Basophils Relative: 0.4 % (ref 0.0–3.0)
EOS ABS: 0.3 10*3/uL (ref 0.0–0.7)
Eosinophils Relative: 3.6 % (ref 0.0–5.0)
HCT: 34 % — ABNORMAL LOW (ref 36.0–46.0)
HEMOGLOBIN: 11.4 g/dL — AB (ref 12.0–15.0)
LYMPHS PCT: 19.9 % (ref 12.0–46.0)
Lymphs Abs: 1.5 10*3/uL (ref 0.7–4.0)
MCHC: 33.5 g/dL (ref 30.0–36.0)
MCV: 91.6 fl (ref 78.0–100.0)
Monocytes Absolute: 0.4 10*3/uL (ref 0.1–1.0)
Monocytes Relative: 5.7 % (ref 3.0–12.0)
NEUTROS ABS: 5.4 10*3/uL (ref 1.4–7.7)
Neutrophils Relative %: 70.4 % (ref 43.0–77.0)
PLATELETS: 270 10*3/uL (ref 150.0–400.0)
RBC: 3.72 Mil/uL — ABNORMAL LOW (ref 3.87–5.11)
RDW: 14.1 % (ref 11.5–15.5)
WBC: 7.6 10*3/uL (ref 4.0–10.5)

## 2014-04-26 LAB — BASIC METABOLIC PANEL
BUN: 32 mg/dL — ABNORMAL HIGH (ref 6–23)
CO2: 23 mEq/L (ref 19–32)
CREATININE: 1.2 mg/dL (ref 0.4–1.2)
Calcium: 9 mg/dL (ref 8.4–10.5)
Chloride: 104 mEq/L (ref 96–112)
GFR: 64.32 mL/min (ref >60.00)
GLUCOSE: 196 mg/dL — AB (ref 70–99)
Potassium: 3.7 mEq/L (ref 3.5–5.1)
SODIUM: 138 meq/L (ref 135–145)

## 2014-04-26 NOTE — Telephone Encounter (Signed)
Walk-In Patient form on 11.12.15: taken to triage: Melinda P assisted:djc °

## 2014-04-26 NOTE — Telephone Encounter (Signed)
Patient called c/o chest pains in the middle of the chest. Pain has been constant since Sunday. Worse when she swallows or takes a deep breath Advised patient to contact PCP and if they feel cardiac related call back Patient verbalized understanding.

## 2014-04-27 ENCOUNTER — Telehealth: Payer: Self-pay | Admitting: *Deleted

## 2014-04-27 ENCOUNTER — Ambulatory Visit (HOSPITAL_COMMUNITY): Payer: Commercial Managed Care - HMO

## 2014-04-27 DIAGNOSIS — I5022 Chronic systolic (congestive) heart failure: Secondary | ICD-10-CM

## 2014-04-27 MED ORDER — OLMESARTAN MEDOXOMIL 20 MG PO TABS: 20.0000 mg | ORAL_TABLET | Freq: Every day | ORAL | Status: DC

## 2014-04-27 NOTE — Telephone Encounter (Signed)
lmptcb to go over results  

## 2014-04-27 NOTE — Telephone Encounter (Signed)
ptcb and has been notified about lab results and to resume benicar, pt asked for new Rx to be sent in. BMET 11/19 when she see's Dr. Delton See, Pt verbalized understanding to Plan of care

## 2014-04-27 NOTE — Telephone Encounter (Signed)
Follow up ° ° ° ° ° °Returning Carol's call °

## 2014-04-30 ENCOUNTER — Ambulatory Visit (HOSPITAL_COMMUNITY): Payer: Commercial Managed Care - HMO

## 2014-05-02 ENCOUNTER — Emergency Department (HOSPITAL_COMMUNITY)
Admission: EM | Admit: 2014-05-02 | Discharge: 2014-05-02 | Disposition: A | Discharge status: Home / Self Care | Payer: Medicare HMO | Attending: Emergency Medicine | Admitting: Emergency Medicine

## 2014-05-02 ENCOUNTER — Encounter (HOSPITAL_COMMUNITY): Payer: Self-pay | Admitting: *Deleted

## 2014-05-02 ENCOUNTER — Ambulatory Visit (HOSPITAL_COMMUNITY): Payer: Commercial Managed Care - HMO

## 2014-05-02 DIAGNOSIS — G43909 Migraine, unspecified, not intractable, without status migrainosus: Secondary | ICD-10-CM | POA: Diagnosis not present

## 2014-05-02 DIAGNOSIS — Z9884 Bariatric surgery status: Secondary | ICD-10-CM | POA: Insufficient documentation

## 2014-05-02 DIAGNOSIS — E119 Type 2 diabetes mellitus without complications: Secondary | ICD-10-CM | POA: Insufficient documentation

## 2014-05-02 DIAGNOSIS — Z79899 Other long term (current) drug therapy: Secondary | ICD-10-CM | POA: Insufficient documentation

## 2014-05-02 DIAGNOSIS — F319 Bipolar disorder, unspecified: Secondary | ICD-10-CM | POA: Diagnosis not present

## 2014-05-02 DIAGNOSIS — Z3202 Encounter for pregnancy test, result negative: Secondary | ICD-10-CM | POA: Diagnosis not present

## 2014-05-02 DIAGNOSIS — G8929 Other chronic pain: Secondary | ICD-10-CM | POA: Insufficient documentation

## 2014-05-02 DIAGNOSIS — I10 Essential (primary) hypertension: Secondary | ICD-10-CM | POA: Insufficient documentation

## 2014-05-02 DIAGNOSIS — R945 Abnormal results of liver function studies: Secondary | ICD-10-CM

## 2014-05-02 DIAGNOSIS — K219 Gastro-esophageal reflux disease without esophagitis: Secondary | ICD-10-CM | POA: Diagnosis not present

## 2014-05-02 DIAGNOSIS — Z7951 Long term (current) use of inhaled steroids: Secondary | ICD-10-CM | POA: Diagnosis not present

## 2014-05-02 DIAGNOSIS — G629 Polyneuropathy, unspecified: Secondary | ICD-10-CM | POA: Diagnosis not present

## 2014-05-02 DIAGNOSIS — R531 Weakness: Secondary | ICD-10-CM | POA: Diagnosis present

## 2014-05-02 DIAGNOSIS — M797 Fibromyalgia: Secondary | ICD-10-CM | POA: Insufficient documentation

## 2014-05-02 DIAGNOSIS — J45909 Unspecified asthma, uncomplicated: Secondary | ICD-10-CM | POA: Insufficient documentation

## 2014-05-02 DIAGNOSIS — R7989 Other specified abnormal findings of blood chemistry: Secondary | ICD-10-CM | POA: Diagnosis not present

## 2014-05-02 DIAGNOSIS — Z794 Long term (current) use of insulin: Secondary | ICD-10-CM | POA: Insufficient documentation

## 2014-05-02 DIAGNOSIS — F419 Anxiety disorder, unspecified: Secondary | ICD-10-CM | POA: Insufficient documentation

## 2014-05-02 DIAGNOSIS — Z7982 Long term (current) use of aspirin: Secondary | ICD-10-CM | POA: Insufficient documentation

## 2014-05-02 DIAGNOSIS — I5023 Acute on chronic systolic (congestive) heart failure: Secondary | ICD-10-CM | POA: Diagnosis not present

## 2014-05-02 DIAGNOSIS — M1990 Unspecified osteoarthritis, unspecified site: Secondary | ICD-10-CM | POA: Insufficient documentation

## 2014-05-02 DIAGNOSIS — Z862 Personal history of diseases of the blood and blood-forming organs and certain disorders involving the immune mechanism: Secondary | ICD-10-CM | POA: Diagnosis not present

## 2014-05-02 DIAGNOSIS — Z792 Long term (current) use of antibiotics: Secondary | ICD-10-CM | POA: Insufficient documentation

## 2014-05-02 LAB — URINALYSIS, ROUTINE W REFLEX MICROSCOPIC
BILIRUBIN URINE: NEGATIVE
GLUCOSE, UA: 100 mg/dL — AB
Hgb urine dipstick: NEGATIVE
Ketones, ur: NEGATIVE mg/dL
Nitrite: NEGATIVE
PH: 5 (ref 5.0–8.0)
PROTEIN: NEGATIVE mg/dL
Specific Gravity, Urine: 1.023 (ref 1.005–1.030)
Urobilinogen, UA: 0.2 mg/dL (ref 0.0–1.0)

## 2014-05-02 LAB — CBC
HCT: 33.3 % — ABNORMAL LOW (ref 36.0–46.0)
HEMOGLOBIN: 11.1 g/dL — AB (ref 12.0–15.0)
MCH: 30.7 pg (ref 26.0–34.0)
MCHC: 33.3 g/dL (ref 30.0–36.0)
MCV: 92 fL (ref 78.0–100.0)
PLATELETS: 252 10*3/uL (ref 150–400)
RBC: 3.62 MIL/uL — AB (ref 3.87–5.11)
RDW: 13.4 % (ref 11.5–15.5)
WBC: 8 10*3/uL (ref 4.0–10.5)

## 2014-05-02 LAB — COMPREHENSIVE METABOLIC PANEL
ALT: 107 U/L — ABNORMAL HIGH (ref 0–35)
AST: 81 U/L — AB (ref 0–37)
Albumin: 3.2 g/dL — ABNORMAL LOW (ref 3.5–5.2)
Alkaline Phosphatase: 300 U/L — ABNORMAL HIGH (ref 39–117)
Anion gap: 12 (ref 5–15)
BUN: 24 mg/dL — ABNORMAL HIGH (ref 6–23)
CO2: 26 mEq/L (ref 19–32)
CREATININE: 1.01 mg/dL (ref 0.50–1.10)
Calcium: 9.2 mg/dL (ref 8.4–10.5)
Chloride: 95 mEq/L — ABNORMAL LOW (ref 96–112)
GFR calc Af Amer: 78 mL/min — ABNORMAL LOW (ref >90)
GFR calc non Af Amer: 68 mL/min — ABNORMAL LOW (ref >90)
Glucose, Bld: 274 mg/dL — ABNORMAL HIGH (ref 70–99)
Potassium: 5 mEq/L (ref 3.7–5.3)
Sodium: 133 mEq/L — ABNORMAL LOW (ref 137–147)
Total Bilirubin: 0.3 mg/dL (ref 0.3–1.2)
Total Protein: 6.9 g/dL (ref 6.0–8.3)

## 2014-05-02 LAB — URINE MICROSCOPIC-ADD ON

## 2014-05-02 LAB — CBG MONITORING, ED: GLUCOSE-CAPILLARY: 231 mg/dL — AB (ref 70–99)

## 2014-05-02 LAB — PREGNANCY, URINE: Preg Test, Ur: NEGATIVE

## 2014-05-02 MED ORDER — SODIUM CHLORIDE 0.9 % IV BOLUS (SEPSIS): 1000.0000 mL | Freq: Once | INTRAVENOUS | Status: DC

## 2014-05-02 NOTE — ED Notes (Signed)
Bed: YT01 Expected date:  Expected time:  Means of arrival:  Comments: EMS generalized weakness

## 2014-05-02 NOTE — Discharge Instructions (Signed)
Call for a follow up appointment with a Family or Primary Care Provider for further evaluation of your medication and repeat your liver function tests. Return if Symptoms worsen.   Take medication as prescribed.

## 2014-05-02 NOTE — ED Notes (Addendum)
Per EMS pt reports generalized weakness and body aches starting today

## 2014-05-02 NOTE — ED Provider Notes (Signed)
CSN: [id number]     Arrival date & time 05/02/14  0902 History   First MD Initiated Contact with Patient 05/02/14 [phone]     No chief complaint on file.    (Consider location/radiation/quality/duration/timing/severity/associated sxs/prior Treatment) HPI Comments: Patient is a 43 year old female past mental history of hypertension, DM, chronic back pain, anxiety, systolic heart failure presents emergency room and chief complaint of generalized weakness since today. Patient reports generalized weakness.  Denies recent change in medication reports taking Flexeril later than normal yesterday at approximately 12 to 1 AM.  Denies other preceding symptoms, or signs of current weakness and tiredness.  The history is provided by the patient. No language interpreter was used.    Past Medical History  Diagnosis Date  . Asthma   . GERD (gastroesophageal reflux disease)   . Diabetes mellitus   . Hyperlipidemia   . Migraines   . Hypertension   . Back pain, chronic   . Depression   . Neuropathy   . Anxiety   . Nervous breakdown sept 2013  . Fibromyalgia   . History of gastric bypass Jan 2013  . Insomnia   . Wound of left breast   . Nonischemic cardiomyopathy   . Systolic CHF, acute on chronic     CARDIOLOGIST-- DR Aris Lot NELSON  ([facility] [place])  . Shortness of breath     with exertion   . Bipolar disorder   . Arthritis     neck and shoulders   . Anemia     hx of iron transfusions - last one 10/2013   . Peripheral vascular disease   . Carpal tunnel syndrome on both sides    Past Surgical History  Procedure Laterality Date  . Back surgery  November 1992  . Cesarean section  December 1998  . Tubal ligation  January 1999  . Cholecystectomy  1998  . Gastric roux-en-y  06/30/2011    Procedure: LAPAROSCOPIC ROUX-EN-Y GASTRIC;  Surgeon: Mariella Saa, MD;  Location: [facility];  Service: General;  Laterality: N/A;     . Esophagogastroduodenoscopy  07/27/2011    Procedure:  ESOPHAGOGASTRODUODENOSCOPY (EGD);  Surgeon: Freddy Jaksch, MD;  Location: [facility];  Service: Endoscopy;  Laterality: N/A;  . Irrigation and debridement abscess Left 11/26/2013    Procedure: IRRIGATION AND DEBRIDEMENT ABSCESS;  Surgeon: Velora Heckler, MD;  Location: [facility];  Service: General;  Laterality: Left;  . Cardiac catheterization  11-14-2013  DR [facility]    NORMAL LV FILLING PRESSURE W/ MILD ELEVATED RV FILLING, POSSIBLY SUFFESTING RV FAILURE OUT OF PROPORTION TO LV FAILURE. EF ABOUT 35% WITH DIFFUSE HYPOKINESIS. NO ANGIOGRAPHIC CAD, NONISCHEMIC CARDIOMYOPATHY  . Incision and drainage of wound Left 12/14/2013    Procedure: IRRIGATION AND DEBRIDEMENT OF LEFT BREAST WOUND WITH PLACEMENT OF A-CELL, WITH CLOSURE ;  Surgeon: Wayland Denis, DO;  Location: [facility];  Service: Plastics;  Laterality: Left;  . Dilitation & currettage/hystroscopy with novasure ablation N/A 02/26/2014    Procedure: DILATATION & CURETTAGE/HYSTEROSCOPY WITH NOVASURE ABLATION;  Surgeon: Meriel Pica, MD;  Location: [facility];  Service: Gynecology;  Laterality: N/A;  . Breast surgery Left     I&D for abcsess   . Spinal cord stimulator insertion N/A 03/09/2014    Procedure: LUMBAR SPINAL CORD STIMULATOR INSERTION;  Surgeon: Gwynne Edinger, MD;  Location: [facility];  Service: Neurosurgery;  Laterality: N/A;   Family History  Problem Relation Age of Onset  . Diabetes Maternal Aunt   . Diabetes Paternal Aunt   .  Diabetes Paternal Grandfather   . Heart attack Neg Hx   . Stroke Neg Hx   . Hypertension Paternal Grandfather   . Diabetes Paternal Grandmother    History  Substance Use Topics  . Smoking status: Never Smoker   . Smokeless tobacco: Never Used  . Alcohol Use: No   OB History    No data available     Review of Systems  Constitutional: Negative for fever and chills.  Respiratory: Negative for cough, chest tightness and shortness of breath.   Cardiovascular: Negative for chest pain.   Gastrointestinal: Negative for nausea, vomiting, abdominal pain and diarrhea.  Neurological: Negative for syncope, light-headedness and headaches.      Allergies  Imitrex  Home Medications   Prior to Admission medications   Medication Sig Start Date End Date Taking? Authorizing Provider  albuterol (PROVENTIL HFA;VENTOLIN HFA) 108 (90 BASE) MCG/ACT inhaler Inhale 2 puffs into the lungs every 6 (six) hours as needed for wheezing.     Historical Provider, MD  ALPRAZolam Prudy Feeler(XANAX) 0.25 MG tablet Take 1 tablet (0.25 mg total) by mouth 2 (two) times daily as needed for anxiety. 12/19/13   Lars MassonKatarina H Nelson, MD  aspirin EC 81 MG tablet Take 1 tablet (81 mg total) by mouth daily. 11/02/13   Lars MassonKatarina H Nelson, MD  Biotin 5000 MCG TABS Take 5,000 mcg by mouth daily.    Historical Provider, MD  buPROPion (WELLBUTRIN XL) 300 MG 24 hr tablet Take 300 mg by mouth daily before breakfast.  02/23/13   Historical Provider, MD  BYSTOLIC 2.5 MG tablet TAKE ONE TABLET BY MOUTH ONCE DAILY 03/31/14   Lars MassonKatarina H Nelson, MD  cephALEXin (KEFLEX) 500 MG capsule Take 1 capsule (500 mg total) by mouth 4 (four) times daily. 03/09/14   Gwynne EdingerPaul C Harkins, MD  citalopram (CELEXA) 40 MG tablet Take 40 mg by mouth daily before breakfast.     Historical Provider, MD  cyclobenzaprine (FLEXERIL) 10 MG tablet Take 10 mg by mouth 3 (three) times daily as needed for muscle spasms.  11/23/13   Historical Provider, MD  fluticasone (FLONASE) 50 MCG/ACT nasal spray Place 1 spray into both nostrils daily.  03/07/13   Historical Provider, MD  furosemide (LASIX) 40 MG tablet Take 1 tablet (40 mg total) by mouth 2 (two) times daily. HOLD PER SCOTT WEAVER, [facility] 10/26 04/09/14   Scott T Alben SpittleWeaver, PA-C  ibuprofen (ADVIL,MOTRIN) 800 MG tablet Take 800 mg by mouth every 8 (eight) hours as needed (pain).     Historical Provider, MD  Insulin Glargine (LANTUS SOLOSTAR) 100 UNIT/ML Solostar Pen Inject 74 Units into the skin daily.    Historical Provider, MD   insulin glulisine (APIDRA) 100 UNIT/ML injection Inject 5 Units into the skin 3 (three) times daily before meals.    Historical Provider, MD  ivabradine HCl (CORLANOR) 5 MG TABS tablet Take 1 tablet (5 mg total) by mouth 2 (two) times daily with a meal. 04/09/14   Lars MassonKatarina H Nelson, MD  lidocaine (LIDODERM) 5 % Place 1 patch onto the skin daily as needed (for back pain).  03/21/13   Historical Provider, MD  lubiprostone (AMITIZA) 24 MCG capsule Take 24 mcg by mouth 2 (two) times daily as needed for constipation.    Historical Provider, MD  metolazone (ZAROXOLYN) 2.5 MG tablet Take 1 tablet (2.5 mg total) by mouth daily. HOLD PER SCOTT WEAVER, [facility] 04/04/14 04/04/14   Scott T Weaver, PA-C  olmesartan (BENICAR) 20 MG tablet Take 1 tablet (20  mg total) by mouth daily. 04/27/14   Beatrice Lecher, PA-C  oxyCODONE-acetaminophen (PERCOCET) 10-325 MG per tablet Take 1 tablet by mouth every 4 (four) hours as needed for pain. 03/09/14   Gwynne Edinger, MD  potassium chloride SA (KLOR-CON M20) 20 MEQ tablet Take 1 tablet (20 mEq total) by mouth daily. HOLD PER SCOTT WEAVER, [facility] 10/26 04/09/14   Scott T Alben Spittle, PA-C  pregabalin (LYRICA) 150 MG capsule Take 150 mg by mouth 3 (three) times daily.    Historical Provider, MD  ranitidine (ZANTAC) 150 MG tablet Take 150 mg by mouth daily. 02/11/13   Historical Provider, MD  tiZANidine (ZANAFLEX) 4 MG tablet Take 4 mg by mouth at bedtime as needed for muscle spasms.    Historical Provider, MD   BP 137/73 mmHg  Pulse 63  Resp 20  SpO2 97% Physical Exam  Constitutional: She is oriented to person, place, and time. She appears well-developed and well-nourished. No distress.  HENT:  Head: Normocephalic and atraumatic.  Eyes: EOM are normal. Pupils are equal, round, and reactive to light.  Neck: Neck supple.  Cardiovascular: Normal rate and regular rhythm.   Pulmonary/Chest: Effort normal. No respiratory distress. She has no wheezes. She has no rales.  Abdominal: Soft.  There is no tenderness. There is no rebound and no guarding.  Neurological: She is alert and oriented to person, place, and time.  Appears sleepy Speech is clear and goal oriented, follows commands II-Visual fields were normal.   III/IV/VI-Pupils were equal and reacted. Extraocular movements were full and conjugate.  V/VII-no facial droop.   VIII-normal.   Motor: Strength 5/5 to upper and lower extremities bilaterally. Moves all 4 extremities equally. Sensory: normal sensation to upper and lower extremities.  Cerebellar: Normal finger to nose bilaterally No pronator drift.  Skin: Skin is warm and dry.  Psychiatric: She has a normal mood and affect. Her behavior is normal.  Nursing note and vitals reviewed.   ED Course  Procedures (including critical care time) Labs Review Labs Reviewed  CBC - Abnormal; Notable for the following:    RBC 3.62 (*)    Hemoglobin 11.1 (*)    HCT 33.3 (*)    All other components within normal limits  URINALYSIS, ROUTINE W REFLEX MICROSCOPIC - Abnormal; Notable for the following:    Color, Urine AMBER (*)    APPearance CLOUDY (*)    Glucose, UA 100 (*)    Leukocytes, UA TRACE (*)    All other components within normal limits  COMPREHENSIVE METABOLIC PANEL - Abnormal; Notable for the following:    Sodium 133 (*)    Chloride 95 (*)    Glucose, Bld 274 (*)    BUN 24 (*)    Albumin 3.2 (*)    AST 81 (*)    ALT 107 (*)    Alkaline Phosphatase 300 (*)    GFR calc non Af Amer 68 (*)    GFR calc Af Amer 78 (*)    All other components within normal limits  URINE MICROSCOPIC-ADD ON - Abnormal; Notable for the following:    Squamous Epithelial / LPF MANY (*)    Bacteria, UA FEW (*)    Casts HYALINE CASTS (*)    All other components within normal limits  CBG MONITORING, ED - Abnormal; Notable for the following:    Glucose-Capillary 231 (*)    All other components within normal limits  PREGNANCY, URINE    Imaging Review No results found.  EKG  Interpretation None      MDM   Final diagnoses:  Weakness  Elevated liver function tests   Pt presents with weakness. Appears tired on exam, as exam continued patient was more alert and did not appear as sleepy questionably pharmacy induced.  Labs ordered. No liver disease to question ammonia levels at this time. No focal deficits.  Dr. Gardiner Rhyme also evaluated the patient during this encounter, agrees on basic labs and continued to monitor.  10:27 AM Pt seen ambulating in hall, unassisted.  She reports feeling almost back to baseline. Labs show elevated glucose, with Na changes due to hyperglycemia, pt reports compliance with insulin, will hold on bolus.  LFTs elevated from previous values.  Advised follow up with pcp for recheck.  Re-eval Pt resting comfortably in room reports feeling back to base line, Requesting to be discharged home. Discussed lab results, imaging results, and treatment plan with the patient. Return precautions given. Reports understanding and no other concerns at this time.  Patient is stable for discharge at this time.  Mellody Drown, PA-C 05/04/14 1339  Mellody Drown, PA-C 05/04/14 1340  Enid Skeens, MD 05/04/14 [phone]

## 2014-05-03 ENCOUNTER — Other Ambulatory Visit (INDEPENDENT_AMBULATORY_CARE_PROVIDER_SITE_OTHER): Payer: Commercial Managed Care - HMO | Admitting: *Deleted

## 2014-05-03 ENCOUNTER — Ambulatory Visit (INDEPENDENT_AMBULATORY_CARE_PROVIDER_SITE_OTHER): Payer: Commercial Managed Care - HMO | Admitting: Cardiology

## 2014-05-03 ENCOUNTER — Encounter: Payer: Self-pay | Admitting: Cardiology

## 2014-05-03 VITALS — BP 116/78 | HR 70 | Ht 65.0 in | Wt 227.0 lb

## 2014-05-03 DIAGNOSIS — I5022 Chronic systolic (congestive) heart failure: Secondary | ICD-10-CM

## 2014-05-03 DIAGNOSIS — I5031 Acute diastolic (congestive) heart failure: Secondary | ICD-10-CM

## 2014-05-03 LAB — BASIC METABOLIC PANEL
BUN: 23 mg/dL (ref 6–23)
CALCIUM: 8.9 mg/dL (ref 8.4–10.5)
CO2: 24 mEq/L (ref 19–32)
Chloride: 102 mEq/L (ref 96–112)
Creatinine, Ser: 1.2 mg/dL (ref 0.4–1.2)
GFR: 60.73 mL/min (ref >60.00)
Glucose, Bld: 212 mg/dL — ABNORMAL HIGH (ref 70–99)
Potassium: 4.3 mEq/L (ref 3.5–5.1)
Sodium: 138 mEq/L (ref 135–145)

## 2014-05-03 MED ORDER — SPIRONOLACTONE 25 MG PO TABS: 25.0000 mg | ORAL_TABLET | Freq: Every day | ORAL | Status: DC

## 2014-05-03 MED ORDER — FUROSEMIDE 80 MG PO TABS: 80.0000 mg | ORAL_TABLET | Freq: Two times a day (BID) | ORAL | Status: DC

## 2014-05-03 MED ORDER — POTASSIUM CHLORIDE CRYS ER 20 MEQ PO TBCR: 20.0000 meq | EXTENDED_RELEASE_TABLET | Freq: Every day | ORAL | Status: DC

## 2014-05-03 NOTE — Patient Instructions (Addendum)
Your physician has recommended you make the following change in your medication:   INCREASE YOUR LASIX TO 80 MG TWICE DAILY   START SPIRONOLACTONE 25 MG DAILY   REFILLED YOUR POTASSIUM AND TAKE THIS AS PRESCRIBED     Your physician recommends that you return for lab work in: ONE MONTH TO CHECK BMET      You have been referred to DR [facility] IN CHF CLINIC      Your physician recommends that you schedule a follow-up appointment in: WITH DR NELSON AS NEEDED OR AS DR [facility] SUGGEST

## 2014-05-03 NOTE — Addendum Note (Signed)
Addended by: Tonita Phoenix on: 05/03/2014 09:38 AM   Modules accepted: Orders

## 2014-05-03 NOTE — Progress Notes (Signed)
Patient ID: KATE LAROCK, female   DOB: 1970/12/30, 43 y.o.   MRN: [id number] Patient ID: TEKELIA KAREEM, female   DOB: Mar 25, 1971, 43 y.o.   MRN: [id number]     Cardiology Office Note   Date:  05/03/2014   ID:  [name], DOB 1971/01/15, MRN [id number]  PCP:  [name], MD  Cardiologist:  Dr. Tobias Alexander     History of Present Illness: [name] is a 43 y.o. female with a history of chronic systolic CHF in the setting of nonischemic cardiomyopathy, DM2, HL, HTN, obesity s/p gastric bypass surgery, anemia, chronic back pain.  She has a hx of menorrhagia and iron deficiency anemia. She is followed by hematology.  She recent had a uterine ablation and her menorrhagia is resolved.  Last seen by Dr. Delton See 9/15. Patient recently called him with a 13 pound weight gain. She was instructed to increase her Lasix and follow up today.  Patient notes sudden weight gain over one to 2 days' time. She has worsening dyspnea. She is NYHA IIb-3. She notes orthopnea as well as PND. She notes increased abdominal girth. She's not certain if she's had increased LE edema. She has occasional chest pains that are fairly atypical. She's had this in the past without significant change. She denies syncope. She has a nonproductive cough.  04/09/2014 - the patient is coming after 10 days, she has lost 7 lbs but continues to be SOB with minimal activity, having increased abdominal girth and constant fatigue. She feels depressed. No CP< no palpitations or syncope. S/P spinal stimulator implantation in September.   05/03/2014 - the patient is coming after 3 weeks, her weight is up from 219 pounds to 227 pounds. She is experiencing paroxysmal nocturnal dyspnea and worsening lower extremity edema. She feels very tired and short of breath at rest.  Studies:  - LHC (6/15):  No angiographic CAD, EF 35%  - Echo (5/15):  EF 20-25%, diffuse HK, grade 2 diastolic dysfunction, mild LAE  - Nuclear (5/15):   No ischemia, EF 26%  - CPX test (8/15):  Primary limitation related to obesity and related restrictive lung physiology, mild circulatory limitation with blunted blood pressure response to exercise and chronotropic incompetence  Recent Labs/Images:  Recent Labs  04/02/14 1706  04/16/14 0859  05/02/14 0908 05/02/14 0909  NA 140  < >  --   < >  --  133*  K 4.3  < >  --   < >  --  5.0  BUN 42*  < >  --   < >  --  24*  CREATININE 1.5*  < >  --   < >  --  1.01  ALT  --   --   --   --   --  107*  HGB  --   --  12.1  < > 11.1*  --   TSH  --   --  1.334  --   --   --   PROBNP 52.0  --   --   --   --   --   < > = values in this interval not displayed.     Wt Readings from Last 3 Encounters:  05/03/14 227 lb (102.967 kg)  04/09/14 219 lb (99.338 kg)  04/02/14 221 lb (100.245 kg)     Past Medical History  Diagnosis Date  . Asthma   . GERD (gastroesophageal reflux disease)   . Diabetes mellitus   .  Hyperlipidemia   . Migraines   . Hypertension   . Back pain, chronic   . Depression   . Neuropathy   . Anxiety   . Nervous breakdown sept 2013  . Fibromyalgia   . History of gastric bypass Jan 2013  . Insomnia   . Wound of left breast   . Nonischemic cardiomyopathy   . Systolic CHF, acute on chronic     CARDIOLOGIST-- DR Aris Lot [name]  ([name])  . Shortness of breath     with exertion   . Bipolar disorder   . Arthritis     neck and shoulders   . Anemia     hx of iron transfusions - last one 10/2013   . Peripheral vascular disease   . Carpal tunnel syndrome on both sides     Current Outpatient Prescriptions  Medication Sig Dispense Refill  . albuterol (PROVENTIL HFA;VENTOLIN HFA) 108 (90 BASE) MCG/ACT inhaler Inhale 2 puffs into the lungs every 6 (six) hours as needed for wheezing.     Marland Kitchen ALPRAZolam (XANAX) 0.25 MG tablet Take 1 tablet (0.25 mg total) by mouth 2 (two) times daily as needed for anxiety. 60 tablet 0  . aspirin EC 81 MG tablet Take 1 tablet (81 mg total) by  mouth daily. 90 tablet 3  . Biotin 5000 MCG TABS Take 5,000 mcg by mouth daily.    Marland Kitchen buPROPion (WELLBUTRIN XL) 300 MG 24 hr tablet Take 300 mg by mouth daily before breakfast.     . BYSTOLIC 2.5 MG tablet TAKE ONE TABLET BY MOUTH ONCE DAILY 30 tablet 3  . cephALEXin (KEFLEX) 500 MG capsule Take 1 capsule (500 mg total) by mouth 4 (four) times daily. 40 capsule 0  . citalopram (CELEXA) 40 MG tablet Take 40 mg by mouth daily before breakfast.     . cyclobenzaprine (FLEXERIL) 10 MG tablet Take 10 mg by mouth 3 (three) times daily as needed for muscle spasms.     . fluticasone (FLONASE) 50 MCG/ACT nasal spray Place 1 spray into both nostrils daily.     . furosemide (LASIX) 40 MG tablet Take 1 tablet (40 mg total) by mouth 2 (two) times daily. HOLD PER SCOTT WEAVER, [facility] 10/26    . ibuprofen (ADVIL,MOTRIN) 800 MG tablet Take 800 mg by mouth every 8 (eight) hours as needed (pain).     . Insulin Glargine (LANTUS SOLOSTAR) 100 UNIT/ML Solostar Pen Inject 74 Units into the skin daily.    . insulin glulisine (APIDRA) 100 UNIT/ML injection Inject 5 Units into the skin 3 (three) times daily before meals.    . ivabradine HCl (CORLANOR) 5 MG TABS tablet Take 1 tablet (5 mg total) by mouth 2 (two) times daily with a meal. 90 tablet 3  . lidocaine (LIDODERM) 5 % Place 1 patch onto the skin daily as needed (for back pain).     . lubiprostone (AMITIZA) 24 MCG capsule Take 24 mcg by mouth 2 (two) times daily as needed for constipation.    . metolazone (ZAROXOLYN) 2.5 MG tablet Take 1 tablet (2.5 mg total) by mouth daily. HOLD PER SCOTT WEAVER, [facility] 04/04/14    . olmesartan (BENICAR) 20 MG tablet Take 1 tablet (20 mg total) by mouth daily. 30 tablet 11  . oxyCODONE-acetaminophen (PERCOCET) 10-325 MG per tablet Take 1 tablet by mouth every 4 (four) hours as needed for pain. (Patient taking differently: Take 0.5-1 tablets by mouth every 4 (four) hours as needed for pain. ) 30 tablet 0  .  potassium chloride SA (KLOR-CON  M20) 20 MEQ tablet Take 1 tablet (20 mEq total) by mouth daily. HOLD PER SCOTT WEAVER, [facility] 10/26    . pregabalin (LYRICA) 150 MG capsule Take 150 mg by mouth 3 (three) times daily.    . ranitidine (ZANTAC) 150 MG tablet Take 150 mg by mouth daily.    Marland Kitchen. tiZANidine (ZANAFLEX) 4 MG tablet Take 4 mg by mouth at bedtime as needed for muscle spasms.     No current facility-administered medications for this visit.     Allergies:   Imitrex   Social History:  The patient  reports that she has never smoked. She has never used smokeless tobacco. She reports that she does not drink alcohol or use illicit drugs.   Family History:  The patient's family history includes Diabetes in her maternal aunt, paternal aunt, paternal grandfather, and paternal grandmother; Hypertension in her paternal grandfather. There is no history of Heart attack or Stroke.   ROS:  Please see the history of present illness.       All other systems reviewed and negative.    PHYSICAL EXAM: VS:  BP 116/78 mmHg  Pulse 70  Ht 5\' 5"  (1.651 m)  Wt 227 lb (102.967 kg)  BMI 37.77 kg/m2  SpO2 97% Well nourished, well developed, in no acute distress HEENT: normal Neck: JVD + 6 cm Cardiac:  normal S1, S2;  RRR; no murmur Lungs:   clear to auscultation bilaterally, no wheezing, rales at bases Abd: distended, nontender, no hepatomegaly Ext:  Mild pitting bilateral LE edema Skin: warm and dry Neuro:  CNs 2-12 intact, no focal abnormalities noted  EKG:  NSR, HR 80, leftward axis, nonspecific ST-T wave changes, QTc 468, no significant change when compared to prior tracings     ASSESSMENT AND PLAN:  1.  Acute on Chronic Systolic CHF:  227 pounds today, 219 on October 26, baseline 216 lbs, her creatinine was 1.5 so her Lasix was decreased to 4 twice a day, however creatinine on November 18 was 1.0 we will increase Lasix to 80 mg twice a day and continue metolazone 2.5 mg daily, restart spironolactone 25 mg by mouth daily. We will  continue nebivolol 2.5 mg daily, Corlanor 5 mg po daily, and olmesartan 20 mg daily. She should be referred back to the heart failure clinic. I will refer her to rehab once recovered from spinal surgery.  2.  Non-Ischemic Cardiomyopathy:  Continue beta blocker, ARB.   3.  Hypertension:  Controlled.   4.  Diabetes Mellitus:  Fair control. Follow up with PCP.  04/12/2013: Hemoglobin-A1c 8.3.  5. Depression - on high doses celexa and wellbutrin, referred to PCP or psychiatry as she would need tapering down before starting a new antidepressants/antipsychotic med.  Follow up with advanced HF.  [name], [name] H 05/03/2014

## 2014-05-04 ENCOUNTER — Ambulatory Visit (HOSPITAL_COMMUNITY): Payer: Commercial Managed Care - HMO

## 2014-05-07 ENCOUNTER — Ambulatory Visit (HOSPITAL_COMMUNITY): Payer: Commercial Managed Care - HMO

## 2014-05-09 ENCOUNTER — Ambulatory Visit (HOSPITAL_COMMUNITY): Payer: Commercial Managed Care - HMO

## 2014-05-14 ENCOUNTER — Telehealth: Payer: Self-pay | Admitting: Cardiology

## 2014-05-14 ENCOUNTER — Ambulatory Visit (HOSPITAL_COMMUNITY): Payer: Commercial Managed Care - HMO

## 2014-05-14 NOTE — Telephone Encounter (Signed)
Informed the pt that [facility] is working on the referral for CHF clinic right now and will be contacting the pt soon to update her on CHF referral status.  Pt verbalized understanding and agrees with this plan.

## 2014-05-14 NOTE — Telephone Encounter (Signed)
New message          Pt calling to get appt with [facility] at the Heart Failure Clinic

## 2014-05-16 ENCOUNTER — Telehealth: Payer: Self-pay | Admitting: Cardiology

## 2014-05-16 ENCOUNTER — Ambulatory Visit (HOSPITAL_COMMUNITY): Payer: Commercial Managed Care - HMO

## 2014-05-16 ENCOUNTER — Telehealth (HOSPITAL_COMMUNITY): Payer: Self-pay | Admitting: Cardiology

## 2014-05-16 NOTE — Telephone Encounter (Signed)
Informed the pt that the scheduler from CHF clinic states that she spoke to her in regards to her CHF appt set for 05/30/14 at 11:40 am.  Informed the pt that the scheduler for the CHF clinic will be contacting her again to inform her of the appt, date, time, and location.  Pt verbalized understanding and gracious for all the assistance provided.

## 2014-05-16 NOTE — Telephone Encounter (Signed)
New message     Pt request to talk to [name]-----she would not tell me what she wanted

## 2014-05-16 NOTE — Telephone Encounter (Signed)
Pt calling to follow-up on her referral to the CHF clinic per Dr Delton See.  Informed the pt that our scheduler sent the information to the heart failure clinic at last telephone conversation on 11/30, and they are to contact the pt with an appt date and time.  Informed the pt I will check the status on this referral and follow-up thereafter.  Pt verbalized understanding and agrees with this plan.

## 2014-05-16 NOTE — Telephone Encounter (Signed)
Attempting to contact regarding new patient, pt aware and voiced understanding

## 2014-05-18 ENCOUNTER — Ambulatory Visit (HOSPITAL_COMMUNITY): Payer: Commercial Managed Care - HMO

## 2014-05-18 ENCOUNTER — Telehealth: Payer: Self-pay | Admitting: Hematology

## 2014-05-18 NOTE — Telephone Encounter (Signed)
moved from CP1 12/14 to YF 12/16. s/w pt she is aware.

## 2014-05-21 ENCOUNTER — Ambulatory Visit (HOSPITAL_COMMUNITY): Payer: Commercial Managed Care - HMO

## 2014-05-23 ENCOUNTER — Ambulatory Visit (HOSPITAL_COMMUNITY): Payer: Commercial Managed Care - HMO

## 2014-05-23 ENCOUNTER — Encounter (HOSPITAL_COMMUNITY): Payer: Self-pay | Admitting: Obstetrics and Gynecology

## 2014-05-24 ENCOUNTER — Encounter (HOSPITAL_COMMUNITY): Payer: Self-pay | Admitting: Cardiology

## 2014-05-25 ENCOUNTER — Ambulatory Visit (HOSPITAL_COMMUNITY): Payer: Commercial Managed Care - HMO

## 2014-05-28 ENCOUNTER — Ambulatory Visit: Payer: Commercial Managed Care - HMO

## 2014-05-28 ENCOUNTER — Other Ambulatory Visit: Payer: Commercial Managed Care - HMO

## 2014-05-30 ENCOUNTER — Telehealth: Payer: Self-pay | Admitting: Hematology

## 2014-05-30 ENCOUNTER — Ambulatory Visit (HOSPITAL_BASED_OUTPATIENT_CLINIC_OR_DEPARTMENT_OTHER): Payer: Commercial Managed Care - HMO | Admitting: Hematology

## 2014-05-30 ENCOUNTER — Ambulatory Visit (HOSPITAL_COMMUNITY)
Admission: RE | Admit: 2014-05-30 | Discharge: 2014-05-30 | Disposition: A | Discharge status: Home / Self Care | Payer: Commercial Managed Care - HMO | Source: Ambulatory Visit | Attending: Cardiology | Admitting: Cardiology

## 2014-05-30 ENCOUNTER — Other Ambulatory Visit (HOSPITAL_BASED_OUTPATIENT_CLINIC_OR_DEPARTMENT_OTHER): Payer: Commercial Managed Care - HMO

## 2014-05-30 VITALS — BP 104/61 | HR 78 | Temp 97.7°F | Resp 19 | Ht 65.0 in | Wt 222.2 lb

## 2014-05-30 VITALS — BP 102/66 | HR 73 | Wt 222.5 lb

## 2014-05-30 DIAGNOSIS — D500 Iron deficiency anemia secondary to blood loss (chronic): Secondary | ICD-10-CM

## 2014-05-30 DIAGNOSIS — I5022 Chronic systolic (congestive) heart failure: Secondary | ICD-10-CM | POA: Insufficient documentation

## 2014-05-30 DIAGNOSIS — E119 Type 2 diabetes mellitus without complications: Secondary | ICD-10-CM

## 2014-05-30 DIAGNOSIS — Z7951 Long term (current) use of inhaled steroids: Secondary | ICD-10-CM | POA: Insufficient documentation

## 2014-05-30 DIAGNOSIS — G43909 Migraine, unspecified, not intractable, without status migrainosus: Secondary | ICD-10-CM | POA: Insufficient documentation

## 2014-05-30 DIAGNOSIS — F319 Bipolar disorder, unspecified: Secondary | ICD-10-CM | POA: Diagnosis not present

## 2014-05-30 DIAGNOSIS — Z794 Long term (current) use of insulin: Secondary | ICD-10-CM | POA: Insufficient documentation

## 2014-05-30 DIAGNOSIS — D649 Anemia, unspecified: Secondary | ICD-10-CM

## 2014-05-30 DIAGNOSIS — I429 Cardiomyopathy, unspecified: Secondary | ICD-10-CM | POA: Diagnosis not present

## 2014-05-30 DIAGNOSIS — M797 Fibromyalgia: Secondary | ICD-10-CM | POA: Diagnosis not present

## 2014-05-30 DIAGNOSIS — H547 Unspecified visual loss: Secondary | ICD-10-CM

## 2014-05-30 DIAGNOSIS — I10 Essential (primary) hypertension: Secondary | ICD-10-CM | POA: Insufficient documentation

## 2014-05-30 DIAGNOSIS — I5031 Acute diastolic (congestive) heart failure: Secondary | ICD-10-CM

## 2014-05-30 DIAGNOSIS — I509 Heart failure, unspecified: Secondary | ICD-10-CM

## 2014-05-30 DIAGNOSIS — Z79899 Other long term (current) drug therapy: Secondary | ICD-10-CM | POA: Insufficient documentation

## 2014-05-30 DIAGNOSIS — N920 Excessive and frequent menstruation with regular cycle: Secondary | ICD-10-CM

## 2014-05-30 DIAGNOSIS — E785 Hyperlipidemia, unspecified: Secondary | ICD-10-CM | POA: Diagnosis not present

## 2014-05-30 DIAGNOSIS — E669 Obesity, unspecified: Secondary | ICD-10-CM | POA: Diagnosis not present

## 2014-05-30 DIAGNOSIS — Z9884 Bariatric surgery status: Secondary | ICD-10-CM

## 2014-05-30 LAB — FERRITIN CHCC: FERRITIN: 258 ng/mL (ref 9–269)

## 2014-05-30 LAB — CBC & DIFF AND RETIC
BASO%: 0.6 % (ref 0.0–2.0)
Basophils Absolute: 0 10*3/uL (ref 0.0–0.1)
EOS ABS: 0.3 10*3/uL (ref 0.0–0.5)
EOS%: 3.9 % (ref 0.0–7.0)
HEMATOCRIT: 36.6 % (ref 34.8–46.6)
HGB: 12.3 g/dL (ref 11.6–15.9)
IMMATURE RETIC FRACT: 8 % (ref 1.60–10.00)
LYMPH%: 28.2 % (ref 14.0–49.7)
MCH: 30.7 pg (ref 25.1–34.0)
MCHC: 33.6 g/dL (ref 31.5–36.0)
MCV: 91.3 fL (ref 79.5–101.0)
MONO#: 0.5 10*3/uL (ref 0.1–0.9)
MONO%: 6.7 % (ref 0.0–14.0)
NEUT%: 60.6 % (ref 38.4–76.8)
NEUTROS ABS: 4.1 10*3/uL (ref 1.5–6.5)
PLATELETS: 281 10*3/uL (ref 145–400)
RBC: 4.01 10*6/uL (ref 3.70–5.45)
RDW: 12.9 % (ref 11.2–14.5)
RETIC %: 2.52 % — AB (ref 0.70–2.10)
RETIC CT ABS: 101.05 10*3/uL — AB (ref 33.70–90.70)
WBC: 6.7 10*3/uL (ref 3.9–10.3)
lymph#: 1.9 10*3/uL (ref 0.9–3.3)

## 2014-05-30 LAB — IRON AND TIBC CHCC
%SAT: 29 % (ref 21–57)
Iron: 91 ug/dL (ref 41–142)
TIBC: 311 ug/dL (ref 236–444)
UIBC: 221 ug/dL (ref 120–384)

## 2014-05-30 LAB — PRO B NATRIURETIC PEPTIDE: Pro B Natriuretic peptide (BNP): 16.5 pg/mL (ref 0–125)

## 2014-05-30 MED ORDER — SACUBITRIL-VALSARTAN 24-26 MG PO TABS: 1.0000 | ORAL_TABLET | Freq: Two times a day (BID) | ORAL | Status: DC

## 2014-05-30 MED ORDER — TORSEMIDE 20 MG PO TABS: 60.0000 mg | ORAL_TABLET | Freq: Every day | ORAL | Status: DC

## 2014-05-30 MED ORDER — METOLAZONE 2.5 MG PO TABS: ORAL_TABLET | ORAL | Status: DC

## 2014-05-30 NOTE — Progress Notes (Signed)
[facility] HEMATOLOGY OFFICE PROGRESS NOTE Date of visit: 04/16/2014  Linda Ravel, MD Dr. Sharman Crate Hamrick [address] [place] [address]  DIAGNOSIS: Anemia, unspecified anemia type - Plan: CBC & Diff and Retic, Ferritin, Iron and TIBC [facility]  CHIEF COMPLAIN: follow up   CURRENT TREATMENT:  Feraheme 1,020 mg given  On 10/12/13 and 03/20/2014  INTERVAL HISTORY:  ILIYANA [name] 43 y.o. female with a history of IDA secondary heavy menses and also a history of gastric bypass is here for follow up.  She was last seen on 03/20/2014 by Dr Rosie Fate.  Her hemoglobin was 7.9 and she received feraheme 1,020 mg.   She reports increased energy after iv iron infusion. She feels more fatigued lately again, but no other new complains.    MEDICAL HISTORY: Past Medical History  Diagnosis Date  . Asthma   . GERD (gastroesophageal reflux disease)   . Diabetes mellitus   . Hyperlipidemia   . Migraines   . Hypertension   . Back pain, chronic   . Depression   . Neuropathy   . Anxiety   . Nervous breakdown sept 2013  . Fibromyalgia   . History of gastric bypass Jan 2013  . Insomnia   . Wound of left breast   . Nonischemic cardiomyopathy   . Systolic CHF, acute on chronic     CARDIOLOGIST-- DR Aris Lot NELSON  (Corinda [place])  . Shortness of breath     with exertion   . Bipolar disorder   . Arthritis     neck and shoulders   . Anemia     hx of iron transfusions - last one 10/2013   . Peripheral vascular disease   . Carpal tunnel syndrome on both sides     INTERIM HISTORY: has Morbid obesity; IDDM (insulin dependent diabetes mellitus); Degenerative disc disease; Nausea and vomiting; Major depressive disorder, recurrent episode, moderate; Compulsive behavior disorder; History of gastric bypass; Anxiety disorder; Nervous breakdown; Hematochezia; Anemia; DM type 2 (diabetes mellitus, type 2); Generalized weakness; Generalized abdominal pain; Hyperactive bowel sounds;  Dehydration; Insomnia; Diabetic neuropathy; Migraine, unspecified, without mention of intractable migraine without mention of status migrainosus; History of iron deficiency anemia; Chronic systolic heart failure; Left breast abscess; Breast injury; and Open breast wound on her problem list.    ALLERGIES:  is allergic to imitrex.  MEDICATIONS: has a current medication list which includes the following prescription(s): albuterol, alprazolam, aspirin ec, biotin, bupropion, bystolic, citalopram, cyclobenzaprine, fluticasone, furosemide, ibuprofen, insulin glargine, insulin glulisine, ivabradine, lidocaine, lubiprostone, metolazone, olmesartan, oxycodone-acetaminophen, potassium chloride sa, pregabalin, ranitidine, spironolactone, and tizanidine.  SURGICAL HISTORY:  Past Surgical History  Procedure Laterality Date  . Back surgery  November 1992  . Cesarean section  December 1998  . Tubal ligation  January 1999  . Cholecystectomy  1998  . Gastric roux-en-y  06/30/2011    Procedure: LAPAROSCOPIC ROUX-EN-Y GASTRIC;  Surgeon: Mariella Saa, MD;  Location: [facility];  Service: General;  Laterality: N/A;     . Esophagogastroduodenoscopy  07/27/2011    Procedure: ESOPHAGOGASTRODUODENOSCOPY (EGD);  Surgeon: Freddy Jaksch, MD;  Location: [facility];  Service: Endoscopy;  Laterality: N/A;  . Irrigation and debridement abscess Left 11/26/2013    Procedure: IRRIGATION AND DEBRIDEMENT ABSCESS;  Surgeon: Velora Heckler, MD;  Location: [facility];  Service: General;  Laterality: Left;  . Cardiac catheterization  11-14-2013  DR [facility]    NORMAL LV FILLING PRESSURE W/ MILD ELEVATED RV FILLING, POSSIBLY SUFFESTING RV FAILURE OUT  OF PROPORTION TO LV FAILURE. EF ABOUT 35% WITH DIFFUSE HYPOKINESIS. NO ANGIOGRAPHIC CAD, NONISCHEMIC CARDIOMYOPATHY  . Incision and drainage of wound Left 12/14/2013    Procedure: IRRIGATION AND DEBRIDEMENT OF LEFT BREAST WOUND WITH PLACEMENT OF A-CELL, WITH CLOSURE ;  Surgeon: Wayland Denislaire  Sanger, DO;  Location: [facility];  Service: Plastics;  Laterality: Left;  . Breast surgery Left     I&D for abcsess   . Spinal cord stimulator insertion N/A 03/09/2014    Procedure: LUMBAR SPINAL CORD STIMULATOR INSERTION;  Surgeon: Gwynne EdingerPaul C Harkins, MD;  Location: [facility];  Service: Neurosurgery;  Laterality: N/A;  . Dilitation & currettage/hystroscopy with novasure ablation N/A 02/26/2014    Procedure: DILATATION & CURETTAGE/HYSTEROSCOPY WITH NOVASURE ABLATION;  Surgeon: Meriel Picaichard M Holland, MD;  Location: [facility];  Service: Gynecology;  Laterality: N/A;  . Left and right heart catheterization with coronary angiogram N/A 11/14/2013    Procedure: LEFT AND RIGHT HEART CATHETERIZATION WITH CORONARY ANGIOGRAM;  Surgeon: Laurey Moralealton S McLean, MD;  [facility];  Service: Cardiovascular;  Laterality: N/A;    REVIEW OF SYSTEMS:   Constitutional: Denies fevers, chills or abnormal weight loss; Positive fatigue Eyes: Denies blurriness of vision Ears, nose, mouth, throat, and face: Denies mucositis or sore throat Respiratory: Denies cough, dyspnea or wheezes Cardiovascular: Denies palpitation, chest discomfort or lower extremity swelling Gastrointestinal:  Denies nausea, heartburn or change in bowel habits Skin: Denies abnormal skin rashes Lymphatics: Denies new lymphadenopathy or easy bruising Neurological:Denies numbness, tingling or new weaknesses Behavioral/Psych: Mood is stable, no new changes  All other systems were reviewed with the patient and are negative.  PHYSICAL EXAMINATION: ECOG PERFORMANCE STATUS: 0  Blood pressure 104/61, pulse 78, temperature 97.7 F (36.5 C), temperature source Oral, resp. rate 19, height 5\' 5"  (1.651 m), weight 222 lb 3.2 oz (100.789 kg), SpO2 100 %.  GENERAL:alert, no distress and comfortable; moderately obese SKIN: skin color, texture, turgor are normal, no rashes or significant lesions; multiple scarred lesion on lower extremity.  EYES: normal, Conjunctiva  are pink and non-injected, sclera clear OROPHARYNX:no exudate, no erythema and lips, buccal mucosa, and tongue normal  NECK: supple, thyroid normal size, non-tender, without nodularity LYMPH:  no palpable lymphadenopathy in the cervical, axillary or supraclavicular BREASTS: No examined.  LUNGS: clear to auscultation with normal breathing effort, no wheezes or rhonchi HEART: regular rate & rhythm and no murmurs and no lower extremity edema ABDOMEN:abdomen soft, non-tender and normal bowel sounds Musculoskeletal:no cyanosis of digits and no clubbing  NEURO: alert & oriented x 3 with fluent speech, no focal motor/sensory deficits  Labs:   CBC Latest Ref Rng 05/30/2014 05/02/2014 04/26/2014  WBC 3.9 - 10.3 10e3/uL 6.7 8.0 7.6  Hemoglobin 11.6 - 15.9 g/dL 04.512.3 11.1(L) 11.4(L)  Hematocrit 34.8 - 46.6 % 36.6 33.3(L) 34.0(L)  Platelets 145 - 400 10e3/uL 281 252 270.0   CMP Latest Ref Rng 05/03/2014 05/02/2014 04/26/2014  Glucose 70 - 99 mg/dL 409(W212(H) 119(J274(H) 478(G196(H)  BUN 6 - 23 mg/dL 23 95(A24(H) 21(H32(H)  Creatinine 0.4 - 1.2 mg/dL 1.2 0.861.01 1.2  Sodium 578135 - 145 mEq/L 138 133(L) 138  Potassium 3.5 - 5.1 mEq/L 4.3 5.0 3.7  Chloride 96 - 112 mEq/L 102 95(L) 104  CO2 19 - 32 mEq/L 24 26 23   Calcium 8.4 - 10.5 mg/dL 8.9 9.2 9.0  Total Protein 6.0 - 8.3 g/dL - 6.9 -  Albumin 3.5 - 5.5 g/dL - - -  Total Bilirubin 0.3 - 1.2 mg/dL - 0.3 -  Alkaline Phos 39 - 117 U/L - 300(H) -  AST 0 - 37 U/L - 81(H) -  ALT 0 - 35 U/L - 107(H) -    Anemia work up Ferritin (Order [id number])      Ferritin  Status: Finalresult Visible to patient:  Not Released Nextappt: 06/04/2014 at 09:10 AM in Cardiology ([facility]) Dx:  Anemia, unspecified anemia type           Ref Range 1d ago  53mo ago  8mo ago  61mo ago     Ferritin 9 - 269 ng/ml 258 578 (H) 33 60        RADIOGRAPHIC STUDIES: None   ASSESSMENT: [name] 43 y.o. female with a history of Anemia, unspecified anemia  type - Plan: CBC & Diff and Retic, Ferritin, Iron and TIBC [facility]    1.  H.o. severe IDA secondary to gastric bypass and menorrhagia   --Previously, her iron indices demonstrated iron level of 67 and ferritin of 33. She has two factor contributing to iron deficiency secondary to menorrhagia and/or s/p gastric bypass surgery. GI workup including FOBT was negative. She received IV Feraheme twice and her hemoglobin significantly improved.  -- her ferritin level is 258 today, hemoglobin 12.3. I do not think she needs IV Feraheme again today. -We'll consider IV Feraheme if her ferritin level Fels below 100.  2. Menorrhagia.  -- to follow with Gynecology, s/p ablation   4. Early CHF.  --Patient is following with [facility]. Check selenium level as it can also cause CHF if deficient.  5. DM2 -- Continue management per PCP.     6. Poor Night Vision:  -- Check Vitamin A as deficiency leads to Nyctalopia.  6. Follow up.  Return to clinic in 3 months with repeat his lab.   All questions were answered. The patient knows to call the clinic with any problems, questions or concerns. We can certainly see the patient much sooner if necessary.  I spent 15 minutes counseling the patient face to face. The total time spent in the appointment was 20 minutes.    Malachy Mood  05/31/2014

## 2014-05-30 NOTE — Telephone Encounter (Signed)
Gave avs & cal for March. °

## 2014-05-30 NOTE — Patient Instructions (Signed)
STOP Asprin, Lasix, and Benicar START Torsemide 60 mg daily, Entresto 24/26 mg one pill twice aday CHANGE Metolazone to 2.5 mg on Mondays and Thursday  Labs today  Your physician has requested that you have a cardiac MRI. Cardiac MRI uses a computer to create images of your heart as its beating, producing both still and moving pictures of your heart and major blood vessels. For further information please visit [email or web address]. Please follow the instruction sheet given to you today for more information.  Your physician recommends that you schedule a follow-up appointment in: 2 weeks with Dr. Shirlee Latch  Do the following things EVERYDAY: 1) Weigh yourself in the morning before breakfast. Write it down and keep it in a log. 2) Take your medicines as prescribed 3) Eat low salt foods-Limit salt (sodium) to 2000 mg per day.  4) Stay as active as you can everyday 5) Limit all fluids for the day to less than 2 liters 6)

## 2014-05-31 ENCOUNTER — Encounter: Payer: Self-pay | Admitting: Hematology

## 2014-05-31 DIAGNOSIS — I5022 Chronic systolic (congestive) heart failure: Secondary | ICD-10-CM | POA: Insufficient documentation

## 2014-05-31 NOTE — Progress Notes (Signed)
Patient ID: Linda SpeakShannon J [name], female   DOB: 10/09/1970, 43 y.o.   MRN: [id number] PCP: Dr. Nathanial RancherHamrick Referring MD: Dr. Delton SeeNelson  43 yo with history of nonischemic cardiomyopathy presents for CHF clinic evaluation.  Cardiomyopathy was diagnosed back in 5/15.  Echo at that time showed EF 20-25%.  Her sister, of note, had a heart transplantation.  She was taken for RHC/LHC showed preserved cardiac output an no angiographic CAD.  CPX showed a mild to moderate functional limitation for her age.    She has been stable symptomatically.  She has been fatigued in general for about 2.5 years, preceeding her diagnosis of CAD.  She is short of breath after walking 50-100 feet.  She is short of breath with steps.   No lightheadedness or syncope.  She has orthopnea and sleeps on several pillows.  She has episodes that sound like PND a couple of times a week.    Patient has been on Lasix 80 mg bid and daily metolazone.  About a month ago, she was told to stop metolazone in a phone note but she appears to have continued it.    Labs (10/15): BNP 52, ferritin normal Labs (11/15): K 4.3, creatinine 1.2, SPEP negative, TSH normal  ECG: NSR, LVH with repolarization abnormality  PMH: 1. Nonischemic cardiomyopathy: Echo (5/15) with EF 20-25%, diffuse hypokinesis, grade II diastolic dysfunction, normal RV size and systolic function.  LHC/RHC (6/15) with no angiographic CAD; mean RA 11, PA 41/16 (mean 27), mean PCWP 15, CI 3.36, PVR 1.8.  CPX (8/15) with RER 1.14, peak VO2 13.7 (63% predicted), peak VO2 20.2 when adjusted for ideal body weight, VE/VCO2 slope 31 => mild to moderate functional limitation.  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 No history of ETOH or drug abuse.  SPEP and TSH negative, ferritin normal.  2. Type II diabetes 3. HTN 4. Hyperlipidemia 5. Low back pain 6. Menorrhagia: s/p uterine ablation.  7. Bipolar disorder 8. Fibromyalgia 9. Migraines 10. Obesity: s/p gastric bypass.   SH: Lives with mother in  [place].  No smoking, ETOH, or drugs.   FH: Sister had heart transplant.  Uncle and grandfather with CHF.   ROS: All systems reviewed and negative except as per HPI.   Current Outpatient Prescriptions  Medication Sig Dispense Refill  . albuterol (PROVENTIL HFA;VENTOLIN HFA) 108 (90 BASE) MCG/ACT inhaler Inhale 2 puffs into the lungs every 6 (six) hours as needed for wheezing.     Marland Kitchen. ALPRAZolam (XANAX) 0.25 MG tablet Take 1 tablet (0.25 mg total) by mouth 2 (two) times daily as needed for anxiety. 60 tablet 0  . Biotin 5000 MCG TABS Take 5,000 mcg by mouth daily.    Marland Kitchen. buPROPion (WELLBUTRIN XL) 300 MG 24 hr tablet Take 300 mg by mouth daily before breakfast.     . BYSTOLIC 2.5 MG tablet TAKE ONE TABLET BY MOUTH ONCE DAILY 30 tablet 3  . citalopram (CELEXA) 40 MG tablet Take 40 mg by mouth daily before breakfast.     . cyclobenzaprine (FLEXERIL) 10 MG tablet Take 10 mg by mouth 3 (three) times daily as needed for muscle spasms.     . fluticasone (FLONASE) 50 MCG/ACT nasal spray Place 1 spray into both nostrils daily.     Marland Kitchen. ibuprofen (ADVIL,MOTRIN) 800 MG tablet Take 800 mg by mouth every 8 (eight) hours as needed (pain).     . Insulin Glargine (LANTUS SOLOSTAR) 100 UNIT/ML Solostar Pen Inject 74 Units into the skin daily.    . insulin glulisine (  APIDRA) 100 UNIT/ML injection Inject 5 Units into the skin 3 (three) times daily before meals.    . ivabradine HCl (CORLANOR) 5 MG TABS tablet Take 1 tablet (5 mg total) by mouth 2 (two) times daily with a meal. 90 tablet 3  . lidocaine (LIDODERM) 5 % Place 1 patch onto the skin daily as needed (for back pain).     . lubiprostone (AMITIZA) 24 MCG capsule Take 24 mcg by mouth 2 (two) times daily as needed for constipation.    . metolazone (ZAROXOLYN) 2.5 MG tablet Take one tab by mouth every Monday and Thursday 8 tablet 3  . oxyCODONE-acetaminophen (PERCOCET) 10-325 MG per tablet Take 1 tablet by mouth every 4 (four) hours as needed for pain. (Patient  taking differently: Take 0.5-1 tablets by mouth every 4 (four) hours as needed for pain. ) 30 tablet 0  . potassium chloride SA (KLOR-CON M20) 20 MEQ tablet Take 1 tablet (20 mEq total) by mouth daily. 90 tablet 6  . pregabalin (LYRICA) 150 MG capsule Take 150 mg by mouth 3 (three) times daily.    . ranitidine (ZANTAC) 150 MG tablet Take 150 mg by mouth daily.    Marland Kitchen spironolactone (ALDACTONE) 25 MG tablet Take 1 tablet (25 mg total) by mouth daily. 90 tablet 6  . tiZANidine (ZANAFLEX) 4 MG tablet Take 4 mg by mouth at bedtime as needed for muscle spasms.    . sacubitril-valsartan (ENTRESTO) 24-26 MG Take 1 tablet by mouth 2 (two) times daily. 60 tablet 6  . torsemide (DEMADEX) 20 MG tablet Take 3 tablets (60 mg total) by mouth daily. 90 tablet 3   No current facility-administered medications for this encounter.   BP 102/66 mmHg  Pulse 73  Wt 222 lb 8 oz (100.925 kg)  SpO2 99% General: NAD Neck: Thick, JVP difficult.  Goiter noted.  Lungs: Clear to auscultation bilaterally with normal respiratory effort. CV: Lateral PMI. Heart regular S1/S2, no S3/S4, no murmur.  1+ ankle edema.  No carotid bruit.  Normal pedal pulses.  Abdomen: Soft, nontender, no hepatosplenomegaly, mild distention.  Skin: Intact without lesions or rashes.  Neurologic: Alert and oriented x 3.  Psych: Normal affect. Extremities: No clubbing or cyanosis.  HEENT: Normal.   Assessment/Plan: 1. Chronic systolic CHF: Nonischemic cardiomyopathy.  Possibly familial (sister had heart transplant).  NYHA III symptoms.  Suspect mild volume overload on exam.  Recent CPX showed mild to moderate functional limitation.  - I would like for her to get a cardiac MRI. This will allow me to assess LGE pattern (infiltrative disease or prior myocarditis could be seen).  I will also use this to reassess EF for possible ICD placement.  She is not a CRT candidate.  - Stop olmesartan and start Entresto 24/26 mg bid.  - Continue nebivolol for now,  will stop it next appointment and replace with Coreg.  - Continue spironolactone and ivabradine at current doses.  - With NICM, she does not need to continue ASA 81.  - I would like her to go on torsemide rather than Lasix.  Stop Lasix, start torsemide 60 mg daily.  She will decrease her metolazone from every day to twice a week on Monday and Thursday.  - BMET/BNP today.  - Followup in 2 wks with BMET.  2. Obesity: She is at risk for OSA.  Will screen for this at next appointment.   Marca Ancona 05/31/2014

## 2014-06-01 ENCOUNTER — Telehealth: Payer: Self-pay | Admitting: Cardiology

## 2014-06-01 DIAGNOSIS — I5022 Chronic systolic (congestive) heart failure: Secondary | ICD-10-CM

## 2014-06-01 DIAGNOSIS — R531 Weakness: Secondary | ICD-10-CM

## 2014-06-01 DIAGNOSIS — D649 Anemia, unspecified: Secondary | ICD-10-CM

## 2014-06-01 MED ORDER — IVABRADINE HCL 5 MG PO TABS: 5.0000 mg | ORAL_TABLET | Freq: Two times a day (BID) | ORAL | Status: DC

## 2014-06-01 NOTE — Telephone Encounter (Signed)
Per Dr Delton See we refilled pts Corlanor for 5 mg po BID for 3 month supply to pharmacy noted on file.  LM on pts confirmed VM that refill was sent.

## 2014-06-01 NOTE — Telephone Encounter (Signed)
New message        Pt has been approved for medication corlanor   pt needs prescription written so that she can get it from the drug store   pt has some samples left   please give pt a call

## 2014-06-01 NOTE — Telephone Encounter (Signed)
Will route to Aggie Hacker RN, Dr. Lindaann Slough nurse.

## 2014-06-04 ENCOUNTER — Other Ambulatory Visit: Payer: Commercial Managed Care - HMO

## 2014-06-05 ENCOUNTER — Other Ambulatory Visit (INDEPENDENT_AMBULATORY_CARE_PROVIDER_SITE_OTHER): Payer: Commercial Managed Care - HMO | Admitting: *Deleted

## 2014-06-05 DIAGNOSIS — I5022 Chronic systolic (congestive) heart failure: Secondary | ICD-10-CM

## 2014-06-05 LAB — BASIC METABOLIC PANEL
BUN: 46 mg/dL — ABNORMAL HIGH (ref 6–23)
CO2: 28 mEq/L (ref 19–32)
Calcium: 8.6 mg/dL (ref 8.4–10.5)
Chloride: 98 mEq/L (ref 96–112)
Creatinine, Ser: 1.6 mg/dL — ABNORMAL HIGH (ref 0.4–1.2)
GFR: 44.59 mL/min — ABNORMAL LOW (ref >60.00)
Glucose, Bld: 437 mg/dL — ABNORMAL HIGH (ref 70–99)
Potassium: 4.5 mEq/L (ref 3.5–5.1)
Sodium: 133 mEq/L — ABNORMAL LOW (ref 135–145)

## 2014-06-11 ENCOUNTER — Telehealth (HOSPITAL_COMMUNITY): Payer: Self-pay | Admitting: Vascular Surgery

## 2014-06-11 NOTE — Telephone Encounter (Signed)
Pt called about her cardiac MRI... She said her insurance was approved and she wants to get it scheduled.. I dint have a auth or when does Dr.Mclean want the MRI.Marland Kitchen PLEASE ADVISE

## 2014-06-13 NOTE — Telephone Encounter (Signed)
Pre cert approved and ready for scheduling Message sent to [facility], they will contact pt with cardiac mri appt Pt aware and voiced understanding

## 2014-06-14 ENCOUNTER — Encounter (HOSPITAL_COMMUNITY): Payer: Self-pay

## 2014-06-14 ENCOUNTER — Encounter: Payer: Self-pay | Admitting: Cardiology

## 2014-06-14 ENCOUNTER — Ambulatory Visit (HOSPITAL_COMMUNITY)
Admission: RE | Admit: 2014-06-14 | Discharge: 2014-06-14 | Disposition: A | Discharge status: Home / Self Care | Payer: Commercial Managed Care - HMO | Source: Ambulatory Visit | Attending: Cardiology | Admitting: Cardiology

## 2014-06-14 VITALS — BP 112/64 | HR 86 | Wt 219.8 lb

## 2014-06-14 DIAGNOSIS — I5022 Chronic systolic (congestive) heart failure: Secondary | ICD-10-CM

## 2014-06-14 DIAGNOSIS — I10 Essential (primary) hypertension: Secondary | ICD-10-CM | POA: Diagnosis not present

## 2014-06-14 DIAGNOSIS — E669 Obesity, unspecified: Secondary | ICD-10-CM | POA: Diagnosis not present

## 2014-06-14 DIAGNOSIS — I429 Cardiomyopathy, unspecified: Secondary | ICD-10-CM | POA: Diagnosis not present

## 2014-06-14 DIAGNOSIS — E119 Type 2 diabetes mellitus without complications: Secondary | ICD-10-CM | POA: Diagnosis not present

## 2014-06-14 DIAGNOSIS — Z79899 Other long term (current) drug therapy: Secondary | ICD-10-CM | POA: Diagnosis not present

## 2014-06-14 DIAGNOSIS — Z794 Long term (current) use of insulin: Secondary | ICD-10-CM | POA: Insufficient documentation

## 2014-06-14 DIAGNOSIS — N179 Acute kidney failure, unspecified: Secondary | ICD-10-CM | POA: Insufficient documentation

## 2014-06-14 MED ORDER — CARVEDILOL 6.25 MG PO TABS: 6.2500 mg | ORAL_TABLET | Freq: Two times a day (BID) | ORAL | Status: DC

## 2014-06-14 NOTE — Progress Notes (Signed)
Patient ID: Linda TOBEY, female   DOB: Jan 25, 1971, 43 y.o.   MRN: [id number] PCP: Dr. Nathanial Rancher Referring MD: Dr. Delton See  43 y.o. with history of nonischemic cardiomyopathy presents for CHF clinic evaluation.  Cardiomyopathy was diagnosed back in 5/15.  Echo at that time showed EF 20-25%.  Her sister, of note, had a heart transplantation.  She was taken for RHC/LHC showed preserved cardiac output an no angiographic CAD.  CPX showed a mild to moderate functional limitation for her age.    She has been stable symptomatically.  She has been fatigued in general for about 2.5 years, preceeding her diagnosis of CAD.    She returns for follow .  Last visit she stopped Arb and started on entresto. Plan for cardiac mri January 06/29/14 . Complaining of dyspnea. Has dry cough. Intermittent chest discomfort. Weight at home 219 pounds. Taking all medications => she was asked to stop metolazone but is still taking it twice a week.  Weight is down 3 lbs.   Labs (10/15): BNP 52, ferritin normal Labs (11/15): K 4.3, creatinine 1.2, SPEP negative, TSH normal Labs (06/05/14): K 4.5 Creatinine 1.6 metolazone was stopped.   PMH: 1. Nonischemic cardiomyopathy: Echo (5/15) with EF 20-25%, diffuse hypokinesis, grade II diastolic dysfunction, normal RV size and systolic function.  LHC/RHC (6/15) with no angiographic CAD; mean RA 11, PA 41/16 (mean 27), mean PCWP 15, CI 3.36, PVR 1.8.  CPX (8/15) with RER 1.14, peak VO2 13.7 (63% predicted), peak VO2 20.2 when adjusted for ideal body weight, VE/VCO2 slope 31 => mild to moderate functional limitation.  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 No history of ETOH or drug abuse.  SPEP and TSH negative, ferritin normal.  2. Type II diabetes 3. HTN 4. Hyperlipidemia 5. Low back pain 6. Menorrhagia: s/p uterine ablation.  7. Bipolar disorder 8. Fibromyalgia 9. Migraines 10. Obesity: s/p gastric bypass.   SH: Lives with mother in [place].  No smoking, ETOH, or drugs.   FH:  Sister had heart transplant.  Uncle and grandfather with CHF.   ROS: All systems reviewed and negative except as per HPI.   Current Outpatient Prescriptions  Medication Sig Dispense Refill  . albuterol (PROVENTIL HFA;VENTOLIN HFA) 108 (90 BASE) MCG/ACT inhaler Inhale 2 puffs into the lungs every 6 (six) hours as needed for wheezing.     Marland Kitchen ALPRAZolam (XANAX) 0.25 MG tablet Take 1 tablet (0.25 mg total) by mouth 2 (two) times daily as needed for anxiety. 60 tablet 0  . Biotin 5000 MCG TABS Take 5,000 mcg by mouth daily.    Marland Kitchen buPROPion (WELLBUTRIN XL) 300 MG 24 hr tablet Take 300 mg by mouth daily before breakfast.     . BYSTOLIC 2.5 MG tablet TAKE ONE TABLET BY MOUTH ONCE DAILY 30 tablet 3  . citalopram (CELEXA) 40 MG tablet Take 40 mg by mouth daily before breakfast.     . cyclobenzaprine (FLEXERIL) 10 MG tablet Take 10 mg by mouth 3 (three) times daily as needed for muscle spasms.     . fluticasone (FLONASE) 50 MCG/ACT nasal spray Place 1 spray into both nostrils daily.     Marland Kitchen ibuprofen (ADVIL,MOTRIN) 800 MG tablet Take 800 mg by mouth every 8 (eight) hours as needed (pain).     . Insulin Glargine (LANTUS SOLOSTAR) 100 UNIT/ML Solostar Pen Inject 74 Units into the skin daily.    . insulin glulisine (APIDRA) 100 UNIT/ML injection Inject 5 Units into the skin 3 (three) times daily before meals.    Marland Kitchen  ivabradine (CORLANOR) 5 MG TABS tablet Take 1 tablet (5 mg total) by mouth 2 (two) times daily with a meal. 90 tablet 3  . lidocaine (LIDODERM) 5 % Place 1 patch onto the skin daily as needed (for back pain).     . lubiprostone (AMITIZA) 24 MCG capsule Take 24 mcg by mouth 2 (two) times daily as needed for constipation.    . metolazone (ZAROXOLYN) 2.5 MG tablet Take one tab by mouth every Monday and Thursday 8 tablet 3  . oxyCODONE-acetaminophen (PERCOCET) 10-325 MG per tablet Take 1 tablet by mouth every 4 (four) hours as needed for pain. (Patient taking differently: Take 0.5-1 tablets by mouth every 4  (four) hours as needed for pain. ) 30 tablet 0  . potassium chloride SA (KLOR-CON M20) 20 MEQ tablet Take 1 tablet (20 mEq total) by mouth daily. 90 tablet 6  . pregabalin (LYRICA) 150 MG capsule Take 150 mg by mouth 3 (three) times daily.    . ranitidine (ZANTAC) 150 MG tablet Take 150 mg by mouth daily.    . sacubitril-valsartan (ENTRESTO) 24-26 MG Take 1 tablet by mouth 2 (two) times daily. 60 tablet 6  . spironolactone (ALDACTONE) 25 MG tablet Take 1 tablet (25 mg total) by mouth daily. 90 tablet 6  . torsemide (DEMADEX) 20 MG tablet Take 3 tablets (60 mg total) by mouth daily. 90 tablet 3   No current facility-administered medications for this encounter.   BP 112/64 mmHg  Pulse 86  Wt 219 lb 12.8 oz (99.701 kg)  SpO2 100% General: NAD Neck: Thick, JVP not elevated.  Goiter noted.  Lungs: Clear to auscultation bilaterally with normal respiratory effort. CV: Lateral PMI. Heart regular S1/S2, no S3/S4, no murmur.  No edema.  No carotid bruit.  Normal pedal pulses.  Abdomen: Soft, nontender, no hepatosplenomegaly, mild distention.  Skin: Intact without lesions or rashes.  Neurologic: Alert and oriented x 3.  Psych: Normal affect. Extremities: No clubbing or cyanosis.  HEENT: Normal.   Assessment/Plan: 1. Chronic systolic CHF: Nonischemic cardiomyopathy.  Possibly familial (sister had heart transplant).  NYHA III symptoms.  Suspect mild volume overload on exam.  Recent CPX showed mild to moderate functional limitation.  - Cardiac MRI pending. This will allow us to assess LGE pattern (infiltrative disease or prior myocarditis could be seen).   - Continue Entresto 24/26 mg bid.  - Stop nebivolol and start carvedilol 6.25 mg twice a day.  - Continue spironolactone and ivabradine at current doses.  - Continue torsemide 60 mg daily.  She will stop metolazone. - Check BMET.  2. Obesity: She is at risk for OSA.  Will screen for this at next appointment.  3. AKI: Elevated creatinine at last  check.  Repeat today, stop metolazone.   Follow up in 6 weeks.   CLEGG,AMY  NP-C   06/14/2014  Patient seen with NP, agree the above note.  Doing well, euvolemic. She will stop metolazone. Stop Bystolic and start Coreg.  Check BMET today.    Marca AnconaDalton Joncarlos Atkison 06/16/2014

## 2014-06-14 NOTE — Patient Instructions (Addendum)
STOP Bystolic  STOP Metolazone START Carvedilol 6.25 mg twice a day  Labs today  Your physician recommends that you schedule a follow-up appointment in: 1 month  Do the following things EVERYDAY: 1) Weigh yourself in the morning before breakfast. Write it down and keep it in a log. 2) Take your medicines as prescribed 3) Eat low salt foods-Limit salt (sodium) to 2000 mg per day.  4) Stay as active as you can everyday 5) Limit all fluids for the day to less than 2 liters 6)

## 2014-06-20 DIAGNOSIS — E785 Hyperlipidemia, unspecified: Secondary | ICD-10-CM | POA: Diagnosis not present

## 2014-06-20 DIAGNOSIS — J309 Allergic rhinitis, unspecified: Secondary | ICD-10-CM | POA: Diagnosis not present

## 2014-06-20 DIAGNOSIS — Z6837 Body mass index (BMI) 37.0-37.9, adult: Secondary | ICD-10-CM | POA: Diagnosis not present

## 2014-06-20 DIAGNOSIS — E1140 Type 2 diabetes mellitus with diabetic neuropathy, unspecified: Secondary | ICD-10-CM | POA: Diagnosis not present

## 2014-06-20 DIAGNOSIS — I10 Essential (primary) hypertension: Secondary | ICD-10-CM | POA: Diagnosis not present

## 2014-06-29 ENCOUNTER — Ambulatory Visit (HOSPITAL_COMMUNITY)
Admission: RE | Admit: 2014-06-29 | Discharge: 2014-06-29 | Disposition: A | Discharge status: Home / Self Care | Payer: Commercial Managed Care - HMO | Source: Ambulatory Visit | Attending: Cardiology | Admitting: Cardiology

## 2014-06-29 DIAGNOSIS — I5022 Chronic systolic (congestive) heart failure: Secondary | ICD-10-CM | POA: Diagnosis not present

## 2014-06-29 DIAGNOSIS — Z5309 Procedure and treatment not carried out because of other contraindication: Secondary | ICD-10-CM | POA: Diagnosis not present

## 2014-06-29 LAB — CREATININE, SERUM
Creatinine, Ser: 0.92 mg/dL (ref 0.50–1.10)
GFR calc Af Amer: 87 mL/min — ABNORMAL LOW (ref >90)
GFR calc non Af Amer: 75 mL/min — ABNORMAL LOW (ref >90)

## 2014-06-29 NOTE — Progress Notes (Signed)
Pt has spinal cord stimulator that is unsafe for the cardiac MRI exam requested. Informed the patient and Dr. Shirlee Latch. Dr. Shirlee Latch to set up echo instead. Patient verbalized understanding. Informed patient that Dr. Alford Highland office would be in touch to schedule the echo.

## 2014-07-02 ENCOUNTER — Other Ambulatory Visit (HOSPITAL_COMMUNITY): Payer: Self-pay | Admitting: Cardiology

## 2014-07-02 ENCOUNTER — Telehealth: Payer: Self-pay | Admitting: Cardiology

## 2014-07-02 DIAGNOSIS — I5020 Unspecified systolic (congestive) heart failure: Secondary | ICD-10-CM

## 2014-07-02 NOTE — Telephone Encounter (Signed)
Pt calling in regards to her surgical clearance for her carpal tunnel surgery on 1/19.  Pt states she needs the clearance now.  Pt states that she knows Dr Shirlee Latch has to clear her, for she is currently being followed up in the CHF clinic.  Pt states that Dr Shirlee Latch verbally told her that she is cleared for this surgery.  No notes noted on file of this.  Pt wanting to know if Dr Shirlee Latch is in the office today, for she was told he was.  Informed the pt that he is and referred her to call into the office to leave a message for Dr Shirlee Latch and nurse to follow-up with this. Pt verbalized understanding and agrees with this plan.

## 2014-07-02 NOTE — Telephone Encounter (Signed)
Follow Up ° °Pt returning call from earlier. Please call. °

## 2014-07-02 NOTE — Telephone Encounter (Signed)
Called Sherri at Dr Carlos Levering office to inform her that the pt no longer is seen by Primary Cardiology, Dr Delton See.  Informed Sherri that the pt was referred to CHF Clinic Dr Shirlee Latch back in Nov 2015 for acute diastolic heart failure.  Informed Sherri that the pt has been followed by the CHF clinic since referral.  Informed the Sherri that the pt has an upcoming echo scheduled for 1/25 and then a est CHF appt on 1/28.  Informed Sherri that I would reach out to the CHF clinic for clearance. Offered contact information to Dr Shirlee Latch at CHF clinic, and Roanna Raider states she has all that information and will be contacting them for the clearance.  Sherri verbalized understanding and pleased with all the assistance provided.

## 2014-07-02 NOTE — Telephone Encounter (Signed)
LMTCB

## 2014-07-02 NOTE — Telephone Encounter (Signed)
Request for surgical clearance:  1. What type of surgery is being performed? Carpal tunnel 2.   3. When is this surgery scheduled? 1.19.16   4. Are there any medications that need to be held prior to surgery and how long? no   5. Name of physician performing surgery? [facility]  6. What is your office phone and fax number? [phone] is office #.   Pt need to speak to someone ASAP today b/c surgery is tomorrow.

## 2014-07-02 NOTE — Telephone Encounter (Signed)
New Msg      Request for surgical clearance:  1. What type of surgery is being performed? Carpel Tunnel relief on right wrist  2. When is this surgery scheduled? 07/03/14  3. Are there any medications that need to be held prior to surgery and how long? If pt is on Plavix, Aspirin, or Coumadin. It will need to be held.   4. Name of physician performing surgery?  Dr. Amanda Pea   5. What is your office phone and fax number? phn [phone] &  fax [phone]   Sherri from Dr. Carlos Levering office calling, trying to get surgical clearance for pt.   States she sent request last week and would like return call.

## 2014-07-03 DIAGNOSIS — G5601 Carpal tunnel syndrome, right upper limb: Secondary | ICD-10-CM | POA: Diagnosis not present

## 2014-07-09 ENCOUNTER — Ambulatory Visit (HOSPITAL_COMMUNITY): Admission: RE | Admit: 2014-07-09 | Payer: Commercial Managed Care - HMO | Source: Ambulatory Visit

## 2014-07-10 ENCOUNTER — Ambulatory Visit (HOSPITAL_COMMUNITY)
Admission: RE | Admit: 2014-07-10 | Discharge: 2014-07-10 | Disposition: A | Discharge status: Home / Self Care | Payer: Commercial Managed Care - HMO | Source: Ambulatory Visit | Attending: Family Medicine | Admitting: Family Medicine

## 2014-07-10 DIAGNOSIS — I5020 Unspecified systolic (congestive) heart failure: Secondary | ICD-10-CM

## 2014-07-10 DIAGNOSIS — E119 Type 2 diabetes mellitus without complications: Secondary | ICD-10-CM | POA: Insufficient documentation

## 2014-07-10 DIAGNOSIS — I509 Heart failure, unspecified: Secondary | ICD-10-CM | POA: Insufficient documentation

## 2014-07-10 NOTE — Progress Notes (Signed)
  Echocardiogram 2D Echocardiogram has been performed.  [name] [name] 07/10/2014, 11:04 AM

## 2014-07-11 DIAGNOSIS — Z4789 Encounter for other orthopedic aftercare: Secondary | ICD-10-CM | POA: Diagnosis not present

## 2014-07-11 DIAGNOSIS — G5601 Carpal tunnel syndrome, right upper limb: Secondary | ICD-10-CM | POA: Diagnosis not present

## 2014-07-12 ENCOUNTER — Ambulatory Visit (HOSPITAL_COMMUNITY)
Admission: RE | Admit: 2014-07-12 | Discharge: 2014-07-12 | Disposition: A | Discharge status: Home / Self Care | Payer: Commercial Managed Care - HMO | Source: Ambulatory Visit | Attending: Cardiology | Admitting: Cardiology

## 2014-07-12 VITALS — BP 136/64 | HR 87 | Wt 226.2 lb

## 2014-07-12 DIAGNOSIS — Z9884 Bariatric surgery status: Secondary | ICD-10-CM | POA: Diagnosis not present

## 2014-07-12 DIAGNOSIS — Z794 Long term (current) use of insulin: Secondary | ICD-10-CM | POA: Insufficient documentation

## 2014-07-12 DIAGNOSIS — I10 Essential (primary) hypertension: Secondary | ICD-10-CM | POA: Diagnosis not present

## 2014-07-12 DIAGNOSIS — E785 Hyperlipidemia, unspecified: Secondary | ICD-10-CM | POA: Diagnosis not present

## 2014-07-12 DIAGNOSIS — Z79899 Other long term (current) drug therapy: Secondary | ICD-10-CM | POA: Diagnosis not present

## 2014-07-12 DIAGNOSIS — I428 Other cardiomyopathies: Secondary | ICD-10-CM | POA: Diagnosis not present

## 2014-07-12 DIAGNOSIS — I5022 Chronic systolic (congestive) heart failure: Secondary | ICD-10-CM | POA: Insufficient documentation

## 2014-07-12 DIAGNOSIS — M797 Fibromyalgia: Secondary | ICD-10-CM | POA: Insufficient documentation

## 2014-07-12 DIAGNOSIS — E669 Obesity, unspecified: Secondary | ICD-10-CM | POA: Insufficient documentation

## 2014-07-12 DIAGNOSIS — F319 Bipolar disorder, unspecified: Secondary | ICD-10-CM | POA: Diagnosis not present

## 2014-07-12 DIAGNOSIS — E119 Type 2 diabetes mellitus without complications: Secondary | ICD-10-CM | POA: Insufficient documentation

## 2014-07-12 DIAGNOSIS — N179 Acute kidney failure, unspecified: Secondary | ICD-10-CM | POA: Insufficient documentation

## 2014-07-12 MED ORDER — SACUBITRIL-VALSARTAN 49-51 MG PO TABS: 1.0000 | ORAL_TABLET | Freq: Two times a day (BID) | ORAL | Status: DC

## 2014-07-12 NOTE — Progress Notes (Signed)
Patient ID: [name], female   DOB: 1970/12/29, 44 y.o.   MRN: [id number] PCP: Dr. Nathanial Rancher Referring MD: Dr. Delton See  44 yo with history of nonischemic cardiomyopathy . Cardiomyopathy was diagnosed back in 5/15.  Echo at that time showed EF 20-25%.  Her sister, of note, had a heart transplantation.  She was taken for RHC/LHC showed preserved cardiac output an no angiographic CAD.  CPX showed a mild to moderate functional limitation for her age.    She has been stable symptomatically.  She has been fatigued in general for about 2.5 years, preceeding her diagnosis of CAD.    She returns for follow . Last visit bystolic stopped and she was started on carvedilol. Complaining of fatigue. Unable to get cardiaic MRI due to spinal stimulator. Denies SOB/PND/Orthopnea. Not weighing daily. Taking medications.   Labs (10/15): BNP 52, ferritin normal Labs (11/15): K 4.3, creatinine 1.2, SPEP negative, TSH normal Labs (06/05/14): K 4.5 Creatinine 1.6 metolazone was stopped.   ECHO 07/10/2014 EF 55-60% Grade I DD  PMH: 1. Nonischemic cardiomyopathy: Echo (5/15) with EF 20-25%, diffuse hypokinesis, grade II diastolic dysfunction, normal RV size and systolic function.  LHC/RHC (6/15) with no angiographic CAD; mean RA 11, PA 41/16 (mean 27), mean PCWP 15, CI 3.36, PVR 1.8.  CPX (8/15) with RER 1.14, peak VO2 13.7 (63% predicted), peak VO2 20.2 when adjusted for ideal body weight, VE/VCO2 slope 31 => mild to moderate functional limitation.  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 No history of ETOH or drug abuse.  SPEP and TSH negative, ferritin normal.  2. Type II diabetes 3. HTN 4. Hyperlipidemia 5. Low back pain 6. Menorrhagia: s/p uterine ablation.  7. Bipolar disorder 8. Fibromyalgia 9. Migraines 10. Obesity: s/p gastric bypass.  11. ECHO 07/10/2014 EF 55-60%   SH: Lives with mother in [place].  No smoking, ETOH, or drugs.   FH: Sister had heart transplant.  Uncle and grandfather with CHF.   ROS:  All systems reviewed and negative except as per HPI.   Current Outpatient Prescriptions  Medication Sig Dispense Refill  . albuterol (PROVENTIL HFA;VENTOLIN HFA) 108 (90 BASE) MCG/ACT inhaler Inhale 2 puffs into the lungs every 6 (six) hours as needed for wheezing.     Marland Kitchen ALPRAZolam (XANAX) 0.25 MG tablet Take 1 tablet (0.25 mg total) by mouth 2 (two) times daily as needed for anxiety. 60 tablet 0  . Biotin 5000 MCG TABS Take 5,000 mcg by mouth daily.    Marland Kitchen buPROPion (WELLBUTRIN XL) 300 MG 24 hr tablet Take 300 mg by mouth daily before breakfast.     . carvedilol (COREG) 6.25 MG tablet Take 1 tablet (6.25 mg total) by mouth 2 (two) times daily with a meal. 60 tablet 3  . citalopram (CELEXA) 40 MG tablet Take 40 mg by mouth daily before breakfast.     . cyclobenzaprine (FLEXERIL) 10 MG tablet Take 10 mg by mouth 3 (three) times daily as needed for muscle spasms.     . fluticasone (FLONASE) 50 MCG/ACT nasal spray Place 1 spray into both nostrils daily.     Marland Kitchen ibuprofen (ADVIL,MOTRIN) 800 MG tablet Take 800 mg by mouth every 8 (eight) hours as needed (pain).     . Insulin Glargine (LANTUS SOLOSTAR) 100 UNIT/ML Solostar Pen Inject 74 Units into the skin daily.    . insulin glulisine (APIDRA) 100 UNIT/ML injection Inject 5 Units into the skin 3 (three) times daily before meals.    . ivabradine (CORLANOR) 5 MG TABS tablet  Take 1 tablet (5 mg total) by mouth 2 (two) times daily with a meal. 90 tablet 3  . lidocaine (LIDODERM) 5 % Place 1 patch onto the skin daily as needed (for back pain).     . lubiprostone (AMITIZA) 24 MCG capsule Take 24 mcg by mouth 2 (two) times daily as needed for constipation.    Marland Kitchen oxyCODONE-acetaminophen (PERCOCET) 10-325 MG per tablet Take 1 tablet by mouth every 4 (four) hours as needed for pain. (Patient taking differently: Take 0.5-1 tablets by mouth every 4 (four) hours as needed for pain. ) 30 tablet 0  . potassium chloride SA (KLOR-CON M20) 20 MEQ tablet Take 1 tablet (20 mEq  total) by mouth daily. 90 tablet 6  . pregabalin (LYRICA) 150 MG capsule Take 150 mg by mouth 3 (three) times daily.    . ranitidine (ZANTAC) 150 MG tablet Take 150 mg by mouth daily.    . sacubitril-valsartan (ENTRESTO) 24-26 MG Take 1 tablet by mouth 2 (two) times daily. 60 tablet 6  . spironolactone (ALDACTONE) 25 MG tablet Take 1 tablet (25 mg total) by mouth daily. 90 tablet 6  . torsemide (DEMADEX) 20 MG tablet Take 3 tablets (60 mg total) by mouth daily. 90 tablet 3   No current facility-administered medications for this encounter.   BP 136/64 mmHg  Pulse 87  Wt 226 lb 4 oz (102.626 kg)  SpO2 98% General: NAD Neck: Thick, JVP not elevated.  Goiter noted.  Lungs: Clear to auscultation bilaterally with normal respiratory effort. CV: Lateral PMI. Heart regular S1/S2, no S3/S4, no murmur.  No edema.  No carotid bruit.  Normal pedal pulses.  Abdomen: Soft, nontender, no hepatosplenomegaly, mild distention.  Skin: Intact without lesions or rashes.  Neurologic: Alert and oriented x 3.  Psych: Normal affect. Extremities: No clubbing or cyanosis. R and LLE trace edema.  HEENT: Normal.   Assessment/Plan: 1. Chronic systolic CHF: Nonischemic cardiomyopathy.  Possibly familial (sister had heart transplant).  NYHA II symptoms.  Volume status stable. Suspect mild volume overload on exam.  Recent CPX showed mild to moderate functional limitation.  - Unable to get cardiac mri due to spinal stimulator. ECHO earlier this week showed marked improved with EF 55-60%.  - Increase Entresto to 49/51 mg bid.  - Continue carvedilol 6.25 mg twice a day.  - Continue spironolactone and ivabradine at current doses.  - Continue torsemide 60 mg daily and she can take an extra 20 mg torsemide as needed.  2. Obesity: She is at risk for OSA.  Will screen for this at next appointment.  3. AKI: Elevated creatinine at last check.  Repeat today, stop metolazone.   Follow up in  8 weeks   CLEGG,AMY  NP-C    07/12/2014  Patient seen and examined with Tonye Becket, NP. We discussed all aspects of the encounter. I agree with the assessment and plan as stated above.   Echo reviewed personally. EF now normalized. Agree with increasing Entresto. Can cut back diuretics slightly as needed. Give FHx of cardiomyopathy, would repeat echo in 6-12 months to ensure stability of EF recovery.   Truman Hayward 9:13 PM

## 2014-07-12 NOTE — Patient Instructions (Signed)
INCREASE Enstresto to 49/51mg  tablet twice daily.  Return in 1-2 weeks for lab work.  Follow up 8 weeks.

## 2014-07-17 DIAGNOSIS — G5601 Carpal tunnel syndrome, right upper limb: Secondary | ICD-10-CM | POA: Diagnosis not present

## 2014-07-19 ENCOUNTER — Other Ambulatory Visit (HOSPITAL_COMMUNITY): Payer: Self-pay

## 2014-07-19 ENCOUNTER — Telehealth (HOSPITAL_COMMUNITY): Payer: Self-pay

## 2014-07-19 MED ORDER — SACUBITRIL-VALSARTAN 49-51 MG PO TABS: 1.0000 | ORAL_TABLET | Freq: Two times a day (BID) | ORAL | Status: DC

## 2014-07-19 NOTE — Telephone Encounter (Signed)
Patient called c/o cost of Entresto.  Has been getting samples and free 30 day supply for lowest dose (24/49), but since going up on dose to 49/51 dose, she now has to get them from pharmacy.  [facility] cost is $400 for a month supply.  Will fax request for prior authorization form.  Advised patient to come by tomorrow to pick up samples as she is completely out.  Aware and agreeable.  Ave Filter

## 2014-07-25 ENCOUNTER — Other Ambulatory Visit (HOSPITAL_COMMUNITY): Payer: Commercial Managed Care - HMO

## 2014-07-25 DIAGNOSIS — G5601 Carpal tunnel syndrome, right upper limb: Secondary | ICD-10-CM | POA: Diagnosis not present

## 2014-08-02 ENCOUNTER — Telehealth (HOSPITAL_COMMUNITY): Payer: Self-pay | Admitting: Vascular Surgery

## 2014-08-02 NOTE — Telephone Encounter (Signed)
Please call pt about [employer]

## 2014-08-08 ENCOUNTER — Ambulatory Visit (HOSPITAL_COMMUNITY)
Admission: RE | Admit: 2014-08-08 | Discharge: 2014-08-08 | Disposition: A | Discharge status: Home / Self Care | Payer: Commercial Managed Care - HMO | Source: Ambulatory Visit | Attending: Adult Health | Admitting: Adult Health

## 2014-08-08 DIAGNOSIS — G5601 Carpal tunnel syndrome, right upper limb: Secondary | ICD-10-CM | POA: Diagnosis not present

## 2014-08-08 DIAGNOSIS — I5022 Chronic systolic (congestive) heart failure: Secondary | ICD-10-CM | POA: Insufficient documentation

## 2014-08-08 LAB — BASIC METABOLIC PANEL
Anion gap: 8 (ref 5–15)
BUN: 13 mg/dL (ref 6–23)
CALCIUM: 8.6 mg/dL (ref 8.4–10.5)
CO2: 25 mmol/L (ref 19–32)
Chloride: 107 mmol/L (ref 96–112)
Creatinine, Ser: 1.03 mg/dL (ref 0.50–1.10)
GFR calc Af Amer: 76 mL/min — ABNORMAL LOW (ref >90)
GFR calc non Af Amer: 66 mL/min — ABNORMAL LOW (ref >90)
GLUCOSE: 192 mg/dL — AB (ref 70–99)
POTASSIUM: 3.8 mmol/L (ref 3.5–5.1)
Sodium: 140 mmol/L (ref 135–145)

## 2014-08-10 DIAGNOSIS — G5601 Carpal tunnel syndrome, right upper limb: Secondary | ICD-10-CM | POA: Diagnosis not present

## 2014-08-14 ENCOUNTER — Telehealth (HOSPITAL_COMMUNITY): Payer: Self-pay | Admitting: Vascular Surgery

## 2014-08-14 NOTE — Telephone Encounter (Signed)
Call pt regarding [employer] prior auth

## 2014-08-14 NOTE — Telephone Encounter (Signed)
Spoke with patient regarding PA for entresto PA is still pending with insurance company Pt was approved for pt assistance program however pt is unable to use assistance until PA is complete with insurance Advised pt to continue meds, can come by to pick up samples as we are working on [place]

## 2014-08-20 ENCOUNTER — Telehealth: Payer: Self-pay | Admitting: *Deleted

## 2014-08-20 NOTE — Telephone Encounter (Signed)
Called to ask when next appointment is

## 2014-08-22 ENCOUNTER — Other Ambulatory Visit: Payer: Self-pay | Admitting: Family Medicine

## 2014-08-22 ENCOUNTER — Telehealth: Payer: Self-pay | Admitting: Hematology

## 2014-08-22 ENCOUNTER — Other Ambulatory Visit: Payer: Commercial Managed Care - HMO

## 2014-08-22 DIAGNOSIS — Z1231 Encounter for screening mammogram for malignant neoplasm of breast: Secondary | ICD-10-CM

## 2014-08-22 NOTE — Telephone Encounter (Signed)
Returned voice mail to reschedule lab. Left message to confirm appointment for 03/11.

## 2014-08-23 ENCOUNTER — Encounter (HOSPITAL_COMMUNITY): Payer: Self-pay

## 2014-08-23 NOTE — Progress Notes (Signed)
[employer] faxed approval letter for ENTRESTO [id number]  Authorizatin good until 08/20/2016

## 2014-08-24 ENCOUNTER — Other Ambulatory Visit (HOSPITAL_BASED_OUTPATIENT_CLINIC_OR_DEPARTMENT_OTHER): Payer: Commercial Managed Care - HMO

## 2014-08-24 DIAGNOSIS — D649 Anemia, unspecified: Secondary | ICD-10-CM

## 2014-08-24 DIAGNOSIS — D500 Iron deficiency anemia secondary to blood loss (chronic): Secondary | ICD-10-CM | POA: Diagnosis not present

## 2014-08-24 LAB — CBC & DIFF AND RETIC
BASO%: 0.3 % (ref 0.0–2.0)
Basophils Absolute: 0 10*3/uL (ref 0.0–0.1)
EOS%: 4.7 % (ref 0.0–7.0)
Eosinophils Absolute: 0.4 10*3/uL (ref 0.0–0.5)
HEMATOCRIT: 36.6 % (ref 34.8–46.6)
HGB: 12.2 g/dL (ref 11.6–15.9)
IMMATURE RETIC FRACT: 5.6 % (ref 1.60–10.00)
LYMPH%: 21 % (ref 14.0–49.7)
MCH: 30 pg (ref 25.1–34.0)
MCHC: 33.3 g/dL (ref 31.5–36.0)
MCV: 90.1 fL (ref 79.5–101.0)
MONO#: 0.4 10*3/uL (ref 0.1–0.9)
MONO%: 5.5 % (ref 0.0–14.0)
NEUT#: 5.1 10*3/uL (ref 1.5–6.5)
NEUT%: 68.5 % (ref 38.4–76.8)
PLATELETS: 263 10*3/uL (ref 145–400)
RBC: 4.06 10*6/uL (ref 3.70–5.45)
RDW: 12.8 % (ref 11.2–14.5)
Retic %: 2.38 % — ABNORMAL HIGH (ref 0.70–2.10)
Retic Ct Abs: 96.63 10*3/uL — ABNORMAL HIGH (ref 33.70–90.70)
WBC: 7.4 10*3/uL (ref 3.9–10.3)
lymph#: 1.6 10*3/uL (ref 0.9–3.3)

## 2014-08-24 LAB — COMPREHENSIVE METABOLIC PANEL (CC13)
ALBUMIN: 3.3 g/dL — AB (ref 3.5–5.0)
ALT: 139 U/L — ABNORMAL HIGH (ref 0–55)
ANION GAP: 11 meq/L (ref 3–11)
AST: 55 U/L — AB (ref 5–34)
Alkaline Phosphatase: 347 U/L — ABNORMAL HIGH (ref 40–150)
BUN: 18.9 mg/dL (ref 7.0–26.0)
CALCIUM: 8.3 mg/dL — AB (ref 8.4–10.4)
CHLORIDE: 103 meq/L (ref 98–109)
CO2: 25 meq/L (ref 22–29)
CREATININE: 1.1 mg/dL (ref 0.6–1.1)
EGFR: 75 mL/min/{1.73_m2} — ABNORMAL LOW (ref >90)
Glucose: 195 mg/dl — ABNORMAL HIGH (ref 70–140)
POTASSIUM: 4 meq/L (ref 3.5–5.1)
Sodium: 138 mEq/L (ref 136–145)
Total Bilirubin: 0.36 mg/dL (ref 0.20–1.20)
Total Protein: 7.1 g/dL (ref 6.4–8.3)

## 2014-08-24 LAB — FERRITIN CHCC: Ferritin: 194 ng/ml (ref 9–269)

## 2014-08-24 LAB — IRON AND TIBC CHCC
%SAT: 18 % — AB (ref 21–57)
Iron: 55 ug/dL (ref 41–142)
TIBC: 298 ug/dL (ref 236–444)
UIBC: 243 ug/dL (ref 120–384)

## 2014-08-27 DIAGNOSIS — R6889 Other general symptoms and signs: Secondary | ICD-10-CM | POA: Diagnosis not present

## 2014-08-27 DIAGNOSIS — Z6841 Body Mass Index (BMI) 40.0 and over, adult: Secondary | ICD-10-CM | POA: Diagnosis not present

## 2014-08-27 DIAGNOSIS — M961 Postlaminectomy syndrome, not elsewhere classified: Secondary | ICD-10-CM | POA: Diagnosis not present

## 2014-08-27 DIAGNOSIS — M5136 Other intervertebral disc degeneration, lumbar region: Secondary | ICD-10-CM | POA: Diagnosis not present

## 2014-08-27 DIAGNOSIS — M546 Pain in thoracic spine: Secondary | ICD-10-CM | POA: Diagnosis not present

## 2014-08-27 DIAGNOSIS — M5416 Radiculopathy, lumbar region: Secondary | ICD-10-CM | POA: Diagnosis not present

## 2014-08-29 ENCOUNTER — Ambulatory Visit (HOSPITAL_BASED_OUTPATIENT_CLINIC_OR_DEPARTMENT_OTHER): Payer: Commercial Managed Care - HMO | Admitting: Hematology

## 2014-08-29 ENCOUNTER — Ambulatory Visit (HOSPITAL_BASED_OUTPATIENT_CLINIC_OR_DEPARTMENT_OTHER): Payer: Commercial Managed Care - HMO

## 2014-08-29 ENCOUNTER — Encounter: Payer: Self-pay | Admitting: Hematology

## 2014-08-29 ENCOUNTER — Telehealth: Payer: Self-pay | Admitting: Hematology

## 2014-08-29 VITALS — BP 141/81 | HR 72 | Temp 98.2°F | Resp 18 | Ht 65.0 in | Wt 221.4 lb

## 2014-08-29 DIAGNOSIS — D649 Anemia, unspecified: Secondary | ICD-10-CM

## 2014-08-29 DIAGNOSIS — R6889 Other general symptoms and signs: Secondary | ICD-10-CM | POA: Diagnosis not present

## 2014-08-29 MED ORDER — SODIUM CHLORIDE 0.9 % IV SOLN
Freq: Once | INTRAVENOUS | Status: AC
  Administered 2014-08-29: 10:00:00 via INTRAVENOUS

## 2014-08-29 MED ORDER — SODIUM CHLORIDE 0.9 % IV SOLN
510.0000 mg | Freq: Once | INTRAVENOUS | Status: AC
  Administered 2014-08-29: 510 mg via INTRAVENOUS
  Filled 2014-08-29: qty 17

## 2014-08-29 NOTE — Patient Instructions (Signed)

## 2014-08-29 NOTE — Progress Notes (Signed)
[facility] HEMATOLOGY OFFICE PROGRESS NOTE Date of visit: 04/16/2014  [name], MD Dr. Sharman Crate Hamrick [address] [place] [address]  DIAGNOSIS: Anemia, unspecified anemia type  CHIEF COMPLAIN: follow up   CURRENT TREATMENT:  Feraheme 1,020 mg given  On 10/12/13 and 03/20/2014, 510mg  08/29/14  INTERVAL HISTORY:  [name] 44 y.o. female with a history of IDA secondary heavy menses and also a history of gastric bypass is here for follow up.  She complains worsening fatigue lately. She had sleep study done which showed no sleep apnea. She does wake up at night, but adequate for sleep again. She took 1 dose of Benadryl at 6 PM last night. She was very fatigued, and hardily keep her eyes open and dozes off frequently during the conversation with me in the exam room. She has chronic body pain from fibromyalgia, which has not changed. No other new complaints. The  MEDICAL HISTORY: Past Medical History  Diagnosis Date  . Asthma   . GERD (gastroesophageal reflux disease)   . Diabetes mellitus   . Hyperlipidemia   . Migraines   . Hypertension   . Back pain, chronic   . Depression   . Neuropathy   . Anxiety   . Nervous breakdown sept 2013  . Fibromyalgia   . History of gastric bypass Jan 2013  . Insomnia   . Wound of left breast   . Nonischemic cardiomyopathy   . Systolic CHF, acute on chronic     CARDIOLOGIST-- DR Aris Lot NELSON  ([place] [place])  . Shortness of breath     with exertion   . Bipolar disorder   . Arthritis     neck and shoulders   . Anemia     hx of iron transfusions - last one 10/2013   . Peripheral vascular disease   . Carpal tunnel syndrome on both sides     INTERIM HISTORY: has Morbid obesity; IDDM (insulin dependent diabetes mellitus); Degenerative disc disease; Nausea and vomiting; Major depressive disorder, recurrent episode, moderate; Compulsive behavior disorder; History of gastric bypass; Anxiety disorder; Nervous  breakdown; Hematochezia; Anemia; DM type 2 (diabetes mellitus, type 2); Generalized weakness; Generalized abdominal pain; Hyperactive bowel sounds; Dehydration; Insomnia; Diabetic neuropathy; Migraine, unspecified, without mention of intractable migraine without mention of status migrainosus; History of iron deficiency anemia; Chronic systolic heart failure; Left breast abscess; Breast injury; Open breast wound; and Chronic systolic CHF (congestive heart failure) on her problem list.    ALLERGIES:  is allergic to imitrex.  MEDICATIONS: has a current medication list which includes the following prescription(s): albuterol, alprazolam, biotin, bupropion, carvedilol, citalopram, cyclobenzaprine, fluticasone, ibuprofen, insulin glargine, insulin glulisine, ivabradine, lidocaine, lubiprostone, oxycodone-acetaminophen, potassium chloride sa, pregabalin, ranitidine, sacubitril-valsartan, spironolactone, and torsemide.  SURGICAL HISTORY:  Past Surgical History  Procedure Laterality Date  . Back surgery  November 1992  . Cesarean section  December 1998  . Tubal ligation  January 1999  . Cholecystectomy  1998  . Gastric roux-en-y  06/30/2011    Procedure: LAPAROSCOPIC ROUX-EN-Y GASTRIC;  Surgeon: Mariella Saa, MD;  Location: [facility];  Service: General;  Laterality: N/A;     . Esophagogastroduodenoscopy  07/27/2011    Procedure: ESOPHAGOGASTRODUODENOSCOPY (EGD);  Surgeon: Freddy Jaksch, MD;  Location: [facility];  Service: Endoscopy;  Laterality: N/A;  . Irrigation and debridement abscess Left 11/26/2013    Procedure: IRRIGATION AND DEBRIDEMENT ABSCESS;  Surgeon: Velora Heckler, MD;  Location: [facility];  Service: General;  Laterality: Left;  .  Cardiac catheterization  11-14-2013  DR [facility]    NORMAL LV FILLING PRESSURE W/ MILD ELEVATED RV FILLING, POSSIBLY SUFFESTING RV FAILURE OUT OF PROPORTION TO LV FAILURE. EF ABOUT 35% WITH DIFFUSE HYPOKINESIS. NO ANGIOGRAPHIC CAD, NONISCHEMIC CARDIOMYOPATHY    . Incision and drainage of wound Left 12/14/2013    Procedure: IRRIGATION AND DEBRIDEMENT OF LEFT BREAST WOUND WITH PLACEMENT OF A-CELL, WITH CLOSURE ;  Surgeon: Wayland Denis, DO;  Location: [facility];  Service: Plastics;  Laterality: Left;  . Breast surgery Left     I&D for abcsess   . Spinal cord stimulator insertion N/A 03/09/2014    Procedure: LUMBAR SPINAL CORD STIMULATOR INSERTION;  Surgeon: Gwynne Edinger, MD;  Location: [facility];  Service: Neurosurgery;  Laterality: N/A;  . Dilitation & currettage/hystroscopy with novasure ablation N/A 02/26/2014    Procedure: DILATATION & CURETTAGE/HYSTEROSCOPY WITH NOVASURE ABLATION;  Surgeon: Meriel Pica, MD;  Location: [facility];  Service: Gynecology;  Laterality: N/A;  . Left and right heart catheterization with coronary angiogram N/A 11/14/2013    Procedure: LEFT AND RIGHT HEART CATHETERIZATION WITH CORONARY ANGIOGRAM;  Surgeon: Laurey Morale, MD;  Location: [facility] C[facility] LAB;  Service: Cardiovascular;  Laterality: N/A;    REVIEW OF SYSTEMS:   Constitutional: Denies fevers, chills or abnormal weight loss; Positive fatigue Eyes: Denies blurriness of vision Ears, nose, mouth, throat, and face: Denies mucositis or sore throat Respiratory: Denies cough, dyspnea or wheezes Cardiovascular: Denies palpitation, chest discomfort or lower extremity swelling Gastrointestinal:  Denies nausea, heartburn or change in bowel habits Skin: Denies abnormal skin rashes Lymphatics: Denies new lymphadenopathy or easy bruising Neurological:Denies numbness, tingling or new weaknesses Behavioral/Psych: Mood is stable, no new changes  All other systems were reviewed with the patient and are negative.  PHYSICAL EXAMINATION: ECOG PERFORMANCE STATUS: 0  Blood pressure 141/81, pulse 72, temperature 98.2 F (36.8 C), temperature source Oral, resp. rate 18, height  (1.651 m), weight 221 lb 6.4 oz (100.426 kg).  GENERAL:alert, no distress and comfortable; moderately  obese SKIN: skin color, texture, turgor are normal, no rashes or significant lesions; multiple scarred lesion on lower extremity.  EYES: normal, Conjunctiva are pink and non-injected, sclera clear OROPHARYNX:no exudate, no erythema and lips, buccal mucosa, and tongue normal  NECK: supple, thyroid normal size, non-tender, without nodularity LYMPH:  no palpable lymphadenopathy in the cervical, axillary or supraclavicular BREASTS: No examined.  LUNGS: clear to auscultation with normal breathing effort, no wheezes or rhonchi HEART: regular rate & rhythm and no murmurs and no lower extremity edema ABDOMEN:abdomen soft, non-tender and normal bowel sounds Musculoskeletal:no cyanosis of digits and no clubbing  NEURO: alert & oriented x 3 with fluent speech, no focal motor/sensory deficits  Labs:   CBC Latest Ref Rng 08/24/2014 05/30/2014 05/02/2014  WBC 3.9 - 10.3 10e3/uL 7.4 6.7 8.0  Hemoglobin 11.6 - 15.9 g/dL 40.9 81.1 11.1(L)  Hematocrit 34.8 - 46.6 % 36.6 36.6 33.3(L)  Platelets 145 - 400 10e3/uL 263 281 252   CMP Latest Ref Rng 08/24/2014 08/08/2014 06/29/2014  Glucose 70 - 140 mg/dl 914(N) 829(F) -  BUN 7.0 - 26.0 mg/dL 62.1 13 -  Creatinine 0.6 - 1.1 mg/dL 1.1 3.08 6.57  Sodium 846 - 145 mEq/L 138 140 -  Potassium 3.5 - 5.1 mEq/L 4.0 3.8 -  Chloride 96 - 112 mmol/L - 107 -  CO2 22 - 29 mEq/L 25 25 -  Calcium 8.4 - 10.4 mg/dL 8.3(L) 8.6 -  Total Protein 6.4 - 8.3  g/dL 7.1 - -  Albumin 3.5 - 5.5 g/dL - - -  Total Bilirubin 0.20 - 1.20 mg/dL 1.61 - -  Alkaline Phos 40 - 150 U/L 347(H) - -  AST 5 - 34 U/L 55(H) - -  ALT 0 - 55 U/L 139(H) - -   Ferritin  Status: Finalresult Visible to patient:  Not Released Nextappt: 09/06/2014 at 12:00 PM in Cardiology [facility]) Dx:  Anemia, unspecified anemia type           Ref Range 5d ago  9mo ago  843mo ago     Ferritin 9 - 269 ng/ml 194 258 578 (H)        Iron and TIBC [facility]  Status: Finalresult Visible to  patient:  Not Released Nextappt: 09/06/2014 at 12:00 PM in Cardiology [facility]) Dx:  Anemia, unspecified anemia type              Ref Range 5d ago  9mo ago  843mo ago     Iron 41 - 142 ug/dL 55 91 096    TIBC 045 - 444 ug/dL 409 811 914    UIBC 782 - 384 ug/dL 956 213 086    %SAT 21 - 57 % 18 (L) 29 39         RADIOGRAPHIC STUDIES: None   ASSESSMENT: [name] 44 y.o. female with a history of Anemia, unspecified anemia type    1.  H.o. severe IDA secondary to gastric bypass and menorrhagia   --Previously, her iron indices demonstrated iron level of 67 and ferritin of 33. She has two factor contributing to iron deficiency secondary to menorrhagia and/or s/p gastric bypass surgery. GI workup including FOBT was negative. She received IV Feraheme twice and her hemoglobin significantly improved.  -She has had endometrial ablation in August 2015, iron deficient anemia has significantly improved afterwards. -- her ferritin level is 194 today, hemoglobin 12.2, iron saturation 18. Her iron level are slowly trending down, she believes her fatigue is related to her iron deficiency. She had similar symptoms before and significantly improved with IV Feraheme. -I will give her IV Feraheme  today.   2. Fatigue and drowsiness -Her TSH and sleep study wall normal. -She will continue follow-up with her primary care physician  3. Menorrhagia.  -- to follow with Gynecology, s/p ablation   4. Early CHF.  --Patient is following with [facility]. Check selenium level as it can also cause CHF if deficient.  5. DM2 -- Continue management per PCP.       6. Follow up.  Return to clinic in 4 months with repeat his lab.   All questions were answered. The patient knows to call the clinic with any problems, questions or concerns. We can certainly see the patient much sooner if necessary.  I spent 15 minutes counseling the patient face to face. The total  time spent in the appointment was 20 minutes.    Malachy Mood  08/29/2014

## 2014-08-29 NOTE — Progress Notes (Signed)
Patient observed for 30 minutes post feraheme infusion - discharged with no complaints.

## 2014-08-29 NOTE — Telephone Encounter (Signed)
Pt confirmed labs/ov per 03/16 POF, gave pt AVS and Calendar..... KJ °

## 2014-09-04 ENCOUNTER — Ambulatory Visit: Payer: Commercial Managed Care - HMO

## 2014-09-04 DIAGNOSIS — R6889 Other general symptoms and signs: Secondary | ICD-10-CM | POA: Diagnosis not present

## 2014-09-04 DIAGNOSIS — G5602 Carpal tunnel syndrome, left upper limb: Secondary | ICD-10-CM | POA: Diagnosis not present

## 2014-09-06 ENCOUNTER — Encounter (HOSPITAL_COMMUNITY): Payer: Self-pay

## 2014-09-06 ENCOUNTER — Ambulatory Visit (HOSPITAL_COMMUNITY)
Admission: RE | Admit: 2014-09-06 | Discharge: 2014-09-06 | Disposition: A | Discharge status: Home / Self Care | Payer: Commercial Managed Care - HMO | Source: Ambulatory Visit | Attending: Internal Medicine | Admitting: Internal Medicine

## 2014-09-06 VITALS — BP 120/72 | HR 86 | Wt 220.0 lb

## 2014-09-06 DIAGNOSIS — F319 Bipolar disorder, unspecified: Secondary | ICD-10-CM | POA: Diagnosis not present

## 2014-09-06 DIAGNOSIS — M797 Fibromyalgia: Secondary | ICD-10-CM | POA: Insufficient documentation

## 2014-09-06 DIAGNOSIS — I5022 Chronic systolic (congestive) heart failure: Secondary | ICD-10-CM | POA: Insufficient documentation

## 2014-09-06 DIAGNOSIS — Z794 Long term (current) use of insulin: Secondary | ICD-10-CM | POA: Insufficient documentation

## 2014-09-06 DIAGNOSIS — E11319 Type 2 diabetes mellitus with unspecified diabetic retinopathy without macular edema: Secondary | ICD-10-CM | POA: Diagnosis not present

## 2014-09-06 DIAGNOSIS — Z79899 Other long term (current) drug therapy: Secondary | ICD-10-CM | POA: Insufficient documentation

## 2014-09-06 DIAGNOSIS — Z9884 Bariatric surgery status: Secondary | ICD-10-CM | POA: Insufficient documentation

## 2014-09-06 DIAGNOSIS — E785 Hyperlipidemia, unspecified: Secondary | ICD-10-CM | POA: Insufficient documentation

## 2014-09-06 DIAGNOSIS — E119 Type 2 diabetes mellitus without complications: Secondary | ICD-10-CM | POA: Insufficient documentation

## 2014-09-06 DIAGNOSIS — I10 Essential (primary) hypertension: Secondary | ICD-10-CM | POA: Diagnosis not present

## 2014-09-06 DIAGNOSIS — R5382 Chronic fatigue, unspecified: Secondary | ICD-10-CM

## 2014-09-06 DIAGNOSIS — E049 Nontoxic goiter, unspecified: Secondary | ICD-10-CM | POA: Insufficient documentation

## 2014-09-06 DIAGNOSIS — I429 Cardiomyopathy, unspecified: Secondary | ICD-10-CM | POA: Diagnosis not present

## 2014-09-06 DIAGNOSIS — E669 Obesity, unspecified: Secondary | ICD-10-CM | POA: Diagnosis not present

## 2014-09-06 DIAGNOSIS — R5383 Other fatigue: Secondary | ICD-10-CM | POA: Diagnosis not present

## 2014-09-06 DIAGNOSIS — R6889 Other general symptoms and signs: Secondary | ICD-10-CM | POA: Diagnosis not present

## 2014-09-06 LAB — BASIC METABOLIC PANEL
ANION GAP: 9 (ref 5–15)
BUN: 12 mg/dL (ref 6–23)
CHLORIDE: 104 mmol/L (ref 96–112)
CO2: 27 mmol/L (ref 19–32)
Calcium: 9.2 mg/dL (ref 8.4–10.5)
Creatinine, Ser: 0.79 mg/dL (ref 0.50–1.10)
GFR calc Af Amer: >90 mL/min (ref >90)
GFR calc non Af Amer: >90 mL/min (ref >90)
Glucose, Bld: 72 mg/dL (ref 70–99)
Potassium: 3.8 mmol/L (ref 3.5–5.1)
Sodium: 140 mmol/L (ref 135–145)

## 2014-09-06 LAB — TSH: TSH: 1.521 u[IU]/mL (ref 0.350–4.500)

## 2014-09-06 MED ORDER — SACUBITRIL-VALSARTAN 97-103 MG PO TABS: 1.0000 | ORAL_TABLET | Freq: Two times a day (BID) | ORAL | Status: DC

## 2014-09-06 NOTE — Progress Notes (Signed)
Patient ID: [name], female   DOB: 1971-03-10, 44 y.o.   MRN: [id number] PCP: Dr. Nathanial Rancher Referring MD: Dr. Delton See  44 yo with history of nonischemic cardiomyopathy . Cardiomyopathy was diagnosed back in 5/15.  Echo at that time showed EF 20-25%. Echo 1/16 EF 55-60%  Her sister, of note, had a heart transplantation.  She was taken for RHC/LHC showed preserved cardiac output an no angiographic CAD.  CPX showed a mild to moderate functional limitation for her age.    She has been stable symptomatically.  She has been fatigued in general for about 2.5 years, preceeding her diagnosis of CAD.    She returns for follow up. Feeling ok. Had carpal tunnel surgery 2 days ago. Still with some exertional dyspnea and fatigue. Says she taking all her medicines. No scale at home to weight. Drinking 10 bottles of water per day and drinking large class of ice water in clinic. Occasional edema. No orthopnea or PND. Denies snoring but says she sleeps poorly. Sleep study 2014 was normal.    Labs (10/15): BNP 52, ferritin normal Labs (11/15): K 4.3, creatinine 1.2, SPEP negative, TSH normal Labs (06/05/14): K 4.5 Creatinine 1.6 metolazone was stopped.  Labs (08/24/14): K 4.0, creatinine 1.1  ECHO 07/10/2014 EF 55-60% Grade I DD CPX (8/15) with RER 1.14, peak VO2 13.7 (63% predicted), peak VO2 20.2 when adjusted for ideal body weight, VE/VCO2 slope 31 => mild to moderate functional limitation.   PMH: 1. Nonischemic cardiomyopathy: Echo (5/15) with EF 20-25%, diffuse hypokinesis, grade II diastolic dysfunction, normal RV size and systolic function.  LHC/RHC (6/15) with no angiographic CAD; mean RA 11, PA 41/16 (mean 27), mean PCWP 15, CI 3.36, PVR 1.8.  CPX (8/15) with RER 1.14, peak VO2 13.7 (63% predicted), peak VO2 20.2 when adjusted for ideal body weight, VE/VCO2 slope 31 => mild to moderate functional limitation.  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 No history of ETOH or drug abuse.  SPEP and TSH negative,  ferritin normal.  2. Type II diabetes 3. HTN 4. Hyperlipidemia 5. Low back pain 6. Menorrhagia: s/p uterine ablation.  7. Bipolar disorder 8. Fibromyalgia 9. Migraines 10. Obesity: s/p gastric bypass.  11. ECHO 07/10/2014 EF 55-60%   SH: Lives with mother in [place].  No smoking, ETOH, or drugs.   FH: Sister had heart transplant.  Uncle and grandfather with CHF.   ROS: All systems reviewed and negative except as per HPI.   Current Outpatient Prescriptions  Medication Sig Dispense Refill  . albuterol (PROVENTIL HFA;VENTOLIN HFA) 108 (90 BASE) MCG/ACT inhaler Inhale 2 puffs into the lungs every 6 (six) hours as needed for wheezing.     Marland Kitchen ALPRAZolam (XANAX) 0.25 MG tablet Take 1 tablet (0.25 mg total) by mouth 2 (two) times daily as needed for anxiety. 60 tablet 0  . Biotin 5000 MCG TABS Take 5,000 mcg by mouth daily.    Marland Kitchen buPROPion (WELLBUTRIN XL) 300 MG 24 hr tablet Take 300 mg by mouth daily before breakfast.     . carvedilol (COREG) 6.25 MG tablet Take 1 tablet (6.25 mg total) by mouth 2 (two) times daily with a meal. 60 tablet 3  . citalopram (CELEXA) 40 MG tablet Take 40 mg by mouth daily before breakfast.     . cyclobenzaprine (FLEXERIL) 10 MG tablet Take 10 mg by mouth 3 (three) times daily as needed for muscle spasms.     . fluticasone (FLONASE) 50 MCG/ACT nasal spray Place 1 spray into both nostrils daily.     Marland Kitchen  ibuprofen (ADVIL,MOTRIN) 800 MG tablet Take 800 mg by mouth every 8 (eight) hours as needed (pain).     . Insulin Glargine (LANTUS SOLOSTAR) 100 UNIT/ML Solostar Pen Inject 74 Units into the skin daily.    . insulin glulisine (APIDRA) 100 UNIT/ML injection Inject 5 Units into the skin 3 (three) times daily before meals.    . ivabradine (CORLANOR) 5 MG TABS tablet Take 1 tablet (5 mg total) by mouth 2 (two) times daily with a meal. 90 tablet 3  . lidocaine (LIDODERM) 5 % Place 1 patch onto the skin daily as needed (for back pain).     . lubiprostone (AMITIZA) 24 MCG  capsule Take 24 mcg by mouth 2 (two) times daily as needed for constipation.    Marland Kitchen oxyCODONE-acetaminophen (PERCOCET) 10-325 MG per tablet Take 1 tablet by mouth every 4 (four) hours as needed for pain. (Patient taking differently: Take 0.5-1 tablets by mouth every 4 (four) hours as needed for pain. ) 30 tablet 0  . potassium chloride SA (KLOR-CON M20) 20 MEQ tablet Take 1 tablet (20 mEq total) by mouth daily. 90 tablet 6  . pregabalin (LYRICA) 150 MG capsule Take 150 mg by mouth 3 (three) times daily.    . ranitidine (ZANTAC) 150 MG tablet Take 150 mg by mouth daily.    . sacubitril-valsartan (ENTRESTO) 49-51 MG Take 1 tablet by mouth 2 (two) times daily. 60 tablet 1  . spironolactone (ALDACTONE) 25 MG tablet Take 1 tablet (25 mg total) by mouth daily. 90 tablet 6  . torsemide (DEMADEX) 20 MG tablet Take 3 tablets (60 mg total) by mouth daily. 90 tablet 3   No current facility-administered medications for this encounter.   BP 120/72 mmHg  Pulse 86  Wt 220 lb (99.791 kg)  SpO2 98% General: NAD Neck: Thick, JVP not elevated.  Goiter noted.  Lungs: Clear to auscultation bilaterally with normal respiratory effort. CV: Lateral PMI. Heart regular S1/S2, no S3/S4, no murmur.  No edema.  No carotid bruit.  Normal pedal pulses.  Abdomen: Soft, nontender, no hepatosplenomegaly, mild distention.  Skin: Intact without lesions or rashes.  Neurologic: Alert and oriented x 3.  Psych: Normal affect. Extremities: No clubbing or cyanosis. R and LLE trace edema.  HEENT: Normal.   Assessment/Plan: 1. Chronic systolic CHF: Nonischemic cardiomyopathy.  Possibly familial (sister had heart transplant).  NYHA II symptoms.  Volume status stable. Suspect mild volume overload on exam.  Recent CPX showed mild to moderate functional limitation.  - Unable to get cardiac mri due to spinal stimulator. ECHO last month showed marked improved with EF 55-60%.  - Increase Entresto to 200 mg bid. Check BMET 2 weeks.  -  Continue carvedilol 6.25 mg twice a day.  - Continue spironolactone and ivabradine at current doses.  - Continue torsemide 60 mg daily and she can take an extra 20 mg torsemide as needed.  - Encouraged her to drink less fluid - Give FHx of cardiomyopathy, would repeat echo in 6-12 months to ensure stability of EF recovery.  2. Obesity: She is at risk for OSA.  Will screen for this at next appointment.  3. AKI: Renal function stable now. If she cuts back on fluid intake will need to cut torsemide.  4. Fatigue: Repeat sleep study. Repeat TFTs, Check thyroid u/s.   Linda Skop,MD 12:18 PM

## 2014-09-06 NOTE — Patient Instructions (Signed)
Increase Entresto to 97/103 mg Twice daily   Labs today  Thyroid Ultrasound  Your physician has recommended that you have a sleep study. This test records several body functions during sleep, including: brain activity, eye movement, oxygen and carbon dioxide blood levels, heart rate and rhythm, breathing rate and rhythm, the flow of air through your mouth and nose, snoring, body muscle movements, and chest and belly movement.  Labs in 2 weeks (bmet)  We will contact you in 3 months to schedule your next appointment and echocardiogram

## 2014-09-06 NOTE — Addendum Note (Signed)
Encounter addended by: Noralee Space, RN on: 09/06/2014 12:43 PM<BR>     Documentation filed: Visit Diagnoses, Medications, Dx Association, Patient Instructions Section, Orders

## 2014-09-07 LAB — T4, FREE: Free T4: 1.04 ng/dL (ref 0.80–1.80)

## 2014-09-07 LAB — T3, FREE: T3 FREE: 2.3 pg/mL (ref 2.0–4.4)

## 2014-09-11 ENCOUNTER — Other Ambulatory Visit: Payer: Self-pay

## 2014-09-11 NOTE — Patient Outreach (Signed)
Patient called and states that she ran over her CBG meter and it is broken.  Encouraged patient to call MD office to see if they have any free meters if not patient to request a prescription for a new meter.  Patient to call back if she is not successful in getting a new meter. Patient agreed with plan.

## 2014-09-12 ENCOUNTER — Other Ambulatory Visit: Payer: Self-pay

## 2014-09-12 DIAGNOSIS — G5601 Carpal tunnel syndrome, right upper limb: Secondary | ICD-10-CM | POA: Diagnosis not present

## 2014-09-12 DIAGNOSIS — Z4789 Encounter for other orthopedic aftercare: Secondary | ICD-10-CM | POA: Diagnosis not present

## 2014-09-12 NOTE — Patient Outreach (Signed)
CSW spoke with patient via phone. She confirmed that she had not completed her advanced directives. She replied she had managed to pay for her electric bill. She was notified that she was not eligible for [employer] at home. CSW reviewed medications and hx. CSW made plans to send coupons for dietary supplies and continue to follow-up to ensure that advanced directives are completed.

## 2014-09-18 DIAGNOSIS — E11319 Type 2 diabetes mellitus with unspecified diabetic retinopathy without macular edema: Secondary | ICD-10-CM | POA: Diagnosis not present

## 2014-09-18 DIAGNOSIS — E559 Vitamin D deficiency, unspecified: Secondary | ICD-10-CM | POA: Diagnosis not present

## 2014-09-18 DIAGNOSIS — E785 Hyperlipidemia, unspecified: Secondary | ICD-10-CM | POA: Diagnosis not present

## 2014-09-18 LAB — HEMOGLOBIN A1C: HEMOGLOBIN A1C: 9.7 % — AB (ref 4.0–6.0)

## 2014-09-18 LAB — LIPID PANEL: LDL CALC: 104 mg/dL

## 2014-09-19 ENCOUNTER — Ambulatory Visit: Payer: Commercial Managed Care - HMO

## 2014-09-21 DIAGNOSIS — E11319 Type 2 diabetes mellitus with unspecified diabetic retinopathy without macular edema: Secondary | ICD-10-CM | POA: Diagnosis not present

## 2014-09-21 DIAGNOSIS — E559 Vitamin D deficiency, unspecified: Secondary | ICD-10-CM | POA: Diagnosis not present

## 2014-09-21 DIAGNOSIS — E785 Hyperlipidemia, unspecified: Secondary | ICD-10-CM | POA: Diagnosis not present

## 2014-09-21 DIAGNOSIS — F419 Anxiety disorder, unspecified: Secondary | ICD-10-CM | POA: Diagnosis not present

## 2014-09-21 DIAGNOSIS — I10 Essential (primary) hypertension: Secondary | ICD-10-CM | POA: Diagnosis not present

## 2014-09-24 ENCOUNTER — Ambulatory Visit (HOSPITAL_COMMUNITY)
Admission: RE | Admit: 2014-09-24 | Discharge: 2014-09-24 | Disposition: A | Discharge status: Home / Self Care | Payer: Commercial Managed Care - HMO | Source: Ambulatory Visit | Attending: Internal Medicine | Admitting: Internal Medicine

## 2014-09-24 DIAGNOSIS — E049 Nontoxic goiter, unspecified: Secondary | ICD-10-CM | POA: Diagnosis not present

## 2014-09-24 DIAGNOSIS — R5383 Other fatigue: Secondary | ICD-10-CM

## 2014-09-25 ENCOUNTER — Telehealth: Payer: Self-pay

## 2014-09-25 ENCOUNTER — Other Ambulatory Visit: Payer: Self-pay

## 2014-09-25 VITALS — BP 128/72 | HR 72

## 2014-09-25 DIAGNOSIS — E1110 Type 2 diabetes mellitus with ketoacidosis without coma: Secondary | ICD-10-CM

## 2014-09-25 NOTE — Patient Outreach (Signed)
[name]   09/25/2014  [name] 1970-09-08 [id number]  [name] is an 44 y.o. female  Subjective: "I am proud of myself." "I love myself again." "You have made me believe I can take care of myself and have motivated me."  Objective:   Review of Systems  Constitutional: Negative.   HENT: Negative.  Negative for hearing loss.        Able to hear regular tone and volume speech. Not hard of hearing  Eyes:       Able to read regular size print with glasses.  Respiratory:       Lung sounds clear bilaterally.  Cardiovascular: Negative.   Gastrointestinal:       Abdomen soft and non-tender. Active bowel sounds.  Genitourinary: Positive for frequency.  Musculoskeletal: Positive for myalgias and back pain. Negative for falls.  Skin:       Scattered superficial open areas on bilateral shins much improved. 0.1 cm x 0.1 cm  Neurological: Positive for sensory change.       Diabetic neuropathy  Psychiatric/Behavioral: Positive for depression. Negative for suicidal ideas. The patient has insomnia.        Referred for sleep study by Dr. Algernon Huxley secondary to complaints of poor sleep.     Physical Exam  Constitutional: She is oriented to person, place, and time. She appears well-developed and well-nourished.  Cardiovascular: Normal rate and regular rhythm.   Respiratory: Effort normal and breath sounds normal.  GI: Soft. Bowel sounds are normal.  Musculoskeletal: She exhibits no edema.  Neurological: She is alert and oriented to person, place, and time.  Skin: Skin is warm and dry.  Psychiatric: She has a normal mood and affect. Her behavior is normal. Judgment and thought content normal.    Current Medications:   Current Outpatient Prescriptions  Medication Sig Dispense Refill  . albuterol (PROVENTIL HFA;VENTOLIN HFA) 108 (90 BASE) MCG/ACT inhaler Inhale 2 puffs into the lungs every 6 (six) hours as needed for wheezing.     Marland Kitchen  ALPRAZolam (XANAX) 0.25 MG tablet Take 1 tablet (0.25 mg total) by mouth 2 (two) times daily as needed for anxiety. 60 tablet 0  . Biotin 5000 MCG TABS Take 5,000 mcg by mouth daily.    Marland Kitchen buPROPion (WELLBUTRIN XL) 300 MG 24 hr tablet Take 300 mg by mouth daily before breakfast.     . carvedilol (COREG) 6.25 MG tablet Take 1 tablet (6.25 mg total) by mouth 2 (two) times daily with a meal. 60 tablet 3  . citalopram (CELEXA) 40 MG tablet Take 40 mg by mouth daily before breakfast.     . cyclobenzaprine (FLEXERIL) 10 MG tablet Take 10 mg by mouth 3 (three) times daily as needed for muscle spasms.     . fluticasone (FLONASE) 50 MCG/ACT nasal spray Place 1 spray into both nostrils daily.     Marland Kitchen ibuprofen (ADVIL,MOTRIN) 800 MG tablet Take 800 mg by mouth every 8 (eight) hours as needed (pain).     . Insulin Glargine (LANTUS SOLOSTAR) 100 UNIT/ML Solostar Pen Inject 74 Units into the skin daily.    . insulin glulisine (APIDRA) 100 UNIT/ML injection Inject 5 Units into the skin 3 (three) times daily before meals.    . ivabradine (CORLANOR) 5 MG TABS tablet Take 1 tablet (5 mg total) by mouth 2 (two) times daily with a meal. 90 tablet 3  . lidocaine (LIDODERM) 5 % Place 1 patch onto the skin daily as  needed (for back pain).     . lubiprostone (AMITIZA) 24 MCG capsule Take 24 mcg by mouth 2 (two) times daily as needed for constipation.    Marland Kitchen oxyCODONE-acetaminophen (PERCOCET) 10-325 MG per tablet Take 1 tablet by mouth every 4 (four) hours as needed for pain. (Patient taking differently: Take 0.5-1 tablets by mouth every 4 (four) hours as needed for pain. ) 30 tablet 0  . potassium chloride SA (KLOR-CON M20) 20 MEQ tablet Take 1 tablet (20 mEq total) by mouth daily. 90 tablet 6  . pregabalin (LYRICA) 150 MG capsule Take 150 mg by mouth 3 (three) times daily.    . ranitidine (ZANTAC) 150 MG tablet Take 150 mg by mouth daily.    . sacubitril-valsartan (ENTRESTO) 97-103 MG Take 1 tablet by mouth 2 (two) times  daily. 60 tablet 3  . spironolactone (ALDACTONE) 25 MG tablet Take 1 tablet (25 mg total) by mouth daily. 90 tablet 6  . torsemide (DEMADEX) 20 MG tablet Take 3 tablets (60 mg total) by mouth daily. 90 tablet 3   No current facility-administered medications for this visit.    Functional Status:   In your present state of health, do you have any difficulty performing the following activities: 09/25/2014 09/12/2014  Hearing? N N  Vision? N N  Difficulty concentrating or making decisions? N N  Walking or climbing stairs? N N  Dressing or bathing? N N  Doing errands, shopping? Y N  Preparing Food and eating ? N N  Using the Toilet? N N  In the past six months, have you accidently leaked urine? N N  Do you have problems with loss of bowel control? N N  Managing your Medications? N N  Managing your Finances? N N  Housekeeping or managing your Housekeeping? Y N    Fall/Depression Screening:    PHQ 2/9 Scores 09/25/2014 09/12/2014 12/27/2013  PHQ - 2 Score 2 - 0  PHQ- 9 Score 7 - -  Exception Documentation (No Data) Patient refusal -   [place]        Patient Outreach from 09/25/2014 in [place] Problem One  Medication Adherence   Care Plan for Problem One  Not Active   Interventions for Problem One Long Term Goal  Use 2 pill boxes to separate morning and evening medications, call pharmacy to refill medication with 7 or less days left, organize medication bottles inot AM and PM meds, set up all AM meds and then PM meds to decrease confusion   [facility]N Long Term Goal (31-90 days)  Patient will demonstarte setting up of pill boxes for AM and PM medications by the April home visit.   [facility]N Long Term Goal Start Date  08/28/14   [facility]N Long Term Goal Met Date  09/25/14   [facility] CM Short Term Goal #1 (0-30 days)  Patient will take all medications as ordered for the next 30 days   THN CM Short Term Goal #1 Start Date  08/28/14   [facility] CM Short Term Goal #1 Met Date   09/25/14   Care Plan Problem Two  Falls   Care Plan for Problem Two  Not Active   Interventions for Problem Two Long Term Goal   1) Keep floors uncluttered 2) Use night lights in hallways 3) Do not wlak when feeling dizzy or weak 4) Wear non-skid footwear 50 use hand railings when navigating stairs   THN Long Term Goal (31-90) days  Patient will not fall in the next 30 days   [facility]N Long Term Goal Start Date  07/31/14   [facility] Long Term Goal Met Date  09/25/14   [facility]N CM Short Term Goal #1 (0-30 days)  Improve home safety in the next 30 days   [facility]N CM Short Term Goal #1 Start Date  08/28/14   [facility] CM Short Term Goal #1 Met Date   09/25/14      Assessment:  Collaborative review of care plan and goals. Celebrated goal attainment and all the progress patient has made. Discussed need to continue to follow care plan. Reviewed the importance of taking medication as ordered and checking CBG's. Praised for self-setup of pill boxes and excellent organization of medical appointments in her calendar. Patient taking active role in her healthcare and has increased her self- [name] skills and participation.  Plan:  Referral to Disease [name] Closure letter to Patient Closure letter to Dr. Lisbeth Ply with status update  Huey Bienenstock RN CCM [facility] Case Manager # [phone]

## 2014-09-25 NOTE — Patient Outreach (Signed)
[facility]) Care Management  09/25/2014  [name] March 18, 1971 [id number]   CSW received call from patient. Patient reports she needs help with completing online application for assistance with getting glasses. CSW scheduled appointment to visit the home again on 10/04/14. Patient denies any other current issues. Patient finishes call due to appointment with nurse CM.

## 2014-09-26 ENCOUNTER — Other Ambulatory Visit: Payer: Self-pay | Admitting: Cardiology

## 2014-09-26 ENCOUNTER — Ambulatory Visit: Payer: Commercial Managed Care - HMO

## 2014-09-26 DIAGNOSIS — G5602 Carpal tunnel syndrome, left upper limb: Secondary | ICD-10-CM | POA: Diagnosis not present

## 2014-09-27 ENCOUNTER — Ambulatory Visit
Admission: RE | Admit: 2014-09-27 | Discharge: 2014-09-27 | Disposition: A | Discharge status: Home / Self Care | Payer: Commercial Managed Care - HMO | Source: Ambulatory Visit | Attending: Family Medicine | Admitting: Family Medicine

## 2014-09-27 DIAGNOSIS — Z1231 Encounter for screening mammogram for malignant neoplasm of breast: Secondary | ICD-10-CM

## 2014-09-27 NOTE — Addendum Note (Signed)
Addended by: Carla Drape on: 09/27/2014 02:45 PM   Modules accepted: Orders

## 2014-09-28 DIAGNOSIS — G5602 Carpal tunnel syndrome, left upper limb: Secondary | ICD-10-CM | POA: Diagnosis not present

## 2014-10-01 ENCOUNTER — Other Ambulatory Visit: Payer: Self-pay

## 2014-10-01 NOTE — Patient Outreach (Signed)
[facility]) Care Management  10/01/2014  TENNY RAUBER 02/12/1971 [id number]   Telephone call to patient.  Explained Disease Management Telephonic program for follow-up regarding Diabetes self-management goals.  Patient agreed to monthly telephonic contacts.  Informed her I will call in the next 3 weeks to discuss how well she is adhering to her medication regimen and checking her blood sugars.  Plan: Telephonic follow-up in the next 3 weeks.  [name], RN, MSN [facility] Health Coach [phone] Fax [phone]

## 2014-10-02 DIAGNOSIS — G5602 Carpal tunnel syndrome, left upper limb: Secondary | ICD-10-CM | POA: Diagnosis not present

## 2014-10-04 ENCOUNTER — Ambulatory Visit: Payer: Commercial Managed Care - HMO

## 2014-10-08 ENCOUNTER — Other Ambulatory Visit: Payer: Self-pay

## 2014-10-09 DIAGNOSIS — G5602 Carpal tunnel syndrome, left upper limb: Secondary | ICD-10-CM | POA: Diagnosis not present

## 2014-10-09 NOTE — Patient Outreach (Signed)
[facility]) Care Management  [facility] Social Work  10/09/2014  DERRIANA OSER 09/09/1970 [id number]  Subjective:    Objective:   Current Medications:  Current Outpatient Prescriptions  Medication Sig Dispense Refill  . albuterol (PROVENTIL HFA;VENTOLIN HFA) 108 (90 BASE) MCG/ACT inhaler Inhale 2 puffs into the lungs every 6 (six) hours as needed for wheezing.     Marland Kitchen ALPRAZolam (XANAX) 0.25 MG tablet Take 1 tablet (0.25 mg total) by mouth 2 (two) times daily as needed for anxiety. 60 tablet 0  . Biotin 5000 MCG TABS Take 5,000 mcg by mouth daily.    Marland Kitchen buPROPion (WELLBUTRIN XL) 300 MG 24 hr tablet Take 300 mg by mouth daily before breakfast.     . carvedilol (COREG) 6.25 MG tablet Take 1 tablet (6.25 mg total) by mouth 2 (two) times daily with a meal. 60 tablet 3  . citalopram (CELEXA) 40 MG tablet Take 40 mg by mouth daily before breakfast.     . cyclobenzaprine (FLEXERIL) 10 MG tablet Take 10 mg by mouth 3 (three) times daily as needed for muscle spasms.     . fluticasone (FLONASE) 50 MCG/ACT nasal spray Place 1 spray into both nostrils daily.     Marland Kitchen ibuprofen (ADVIL,MOTRIN) 800 MG tablet Take 800 mg by mouth every 8 (eight) hours as needed (pain).     . Insulin Glargine (LANTUS SOLOSTAR) 100 UNIT/ML Solostar Pen Inject 74 Units into the skin daily.    . insulin glulisine (APIDRA) 100 UNIT/ML injection Inject 5 Units into the skin 3 (three) times daily before meals.    . ivabradine (CORLANOR) 5 MG TABS tablet Take 1 tablet (5 mg total) by mouth 2 (two) times daily with a meal. 90 tablet 3  . lidocaine (LIDODERM) 5 % Place 1 patch onto the skin daily as needed (for back pain).     . lubiprostone (AMITIZA) 24 MCG capsule Take 24 mcg by mouth 2 (two) times daily as needed for constipation.    Marland Kitchen oxyCODONE-acetaminophen (PERCOCET) 10-325 MG per tablet Take 1 tablet by mouth every 4 (four) hours as needed for pain. (Patient taking differently: Take 0.5-1 tablets by mouth every  4 (four) hours as needed for pain. ) 30 tablet 0  . potassium chloride SA (KLOR-CON M20) 20 MEQ tablet Take 1 tablet (20 mEq total) by mouth daily. 90 tablet 6  . pregabalin (LYRICA) 150 MG capsule Take 150 mg by mouth 3 (three) times daily.    . ranitidine (ZANTAC) 150 MG tablet Take 150 mg by mouth daily.    . sacubitril-valsartan (ENTRESTO) 97-103 MG Take 1 tablet by mouth 2 (two) times daily. 60 tablet 3  . spironolactone (ALDACTONE) 25 MG tablet Take 1 tablet (25 mg total) by mouth daily. 90 tablet 6  . torsemide (DEMADEX) 20 MG tablet Take 3 tablets (60 mg total) by mouth daily. 90 tablet 3   No current facility-administered medications for this visit.    Functional Status:  In your present state of health, do you have any difficulty performing the following activities: 09/25/2014 09/25/2014  Hearing? (No Data) N  Vision? - N  Difficulty concentrating or making decisions? - N  Walking or climbing stairs? - N  Dressing or bathing? - N  Doing errands, shopping? - Y  Conservation officer, nature and eating ? - N  Using the Toilet? - N  In the past six months, have you accidently leaked urine? - N  Do you have problems with loss of bowel  control? - N  Managing your Medications? - N  Managing your Finances? - N  Housekeeping or managing your Housekeeping? - Y    Fall/Depression Screening:  PHQ 2/9 Scores 09/25/2014 09/12/2014 12/27/2013  PHQ - 2 Score 2 - 0  PHQ- 9 Score 7 - -  Exception Documentation (No Data) Patient refusal -    Assessment: CSW met with patient at home for routine visit. Patient requested assistance with applying for glasses. The website required access to a community agency. CSW contacted [employer] and social services to determine if either might be able to apply on her behalf. Both agencies denied the ability to assist. CSW then contacted local [employer]. The [employer] said they do not do the voucher program. CSW then contacted other [employer] branches and was  directed to one of the [employer]. They reported they could not provide the voucher to someone outside their service area. They reported they could have patient pay for a voucher for $50 and that would be a savings of $150 at [employer]. Patient reported she would see if she could get the money but she said money was very tight and she would likely not be able to pay $50. Patient reported she had not made any further progress with advanced directives. Patient denies any further problems besides pain.   Plan: CSW will try again to contact [employer] to see if they can provide the $50 to pay for the voucher. CSW will continue to work with patient to assist with finding glasses.

## 2014-10-16 ENCOUNTER — Telehealth: Payer: Self-pay

## 2014-10-16 ENCOUNTER — Other Ambulatory Visit: Payer: Self-pay | Admitting: *Deleted

## 2014-10-16 DIAGNOSIS — G5602 Carpal tunnel syndrome, left upper limb: Secondary | ICD-10-CM | POA: Diagnosis not present

## 2014-10-16 NOTE — Patient Outreach (Signed)
Spoke with patient regarding LIS status.  Patient has been approved for 100% subsidy with [employer] with medicare prescription drug plan costs.  Patient states she received a letter stating that she was approved but was unsure what this meant.  Advised patient to contact number on the letter regarding the benefits for her plan.  Patient was excited that she had been approved.  No other needs at this time for patient assistance.

## 2014-10-16 NOTE — Patient Outreach (Signed)
[facility]) Care Management  10/16/2014  EMERSYNN GOTTESMAN July 25, 1970 [id number]  CSW made an initial attempt to try and contact patient today to follow-up regarding patient's [employer] voucher to help purchase glasses; however, patient was unavailable at the time of CSW's call.  CSW left a HIPAA compliant message for patient on voicemail.  In CSW's message to patient, CSW explained to patient that CSW would be providing social work services moving forward.  CSW provided patient with CSW's contact information, encouraging patient to return CSW's call at her earliest convenience.  CSW is currently awaiting a return call.  In the meantime though, CSW received an In [employer] message from Nena Polio, Care Management Assistant with [facility] Care Management, indicating that patient has been approved for 100% cover through LIS (Low Income Subsidy).  Mrs. Christell Constant was able to deliver this news to patient, who appeared to be very pleased with her recent approval letter.  Danford Bad, BSW, MSW, LCSW [facility] [address] [place], [address] [place], [place] [address] [email or web address] [phone]

## 2014-10-17 ENCOUNTER — Encounter: Payer: Self-pay | Admitting: Endocrinology

## 2014-10-17 ENCOUNTER — Other Ambulatory Visit: Payer: Commercial Managed Care - HMO

## 2014-10-17 ENCOUNTER — Ambulatory Visit (INDEPENDENT_AMBULATORY_CARE_PROVIDER_SITE_OTHER): Payer: Commercial Managed Care - HMO | Admitting: Endocrinology

## 2014-10-17 VITALS — BP 138/88 | HR 67 | Temp 97.7°F | Resp 16 | Ht 65.0 in | Wt 218.2 lb

## 2014-10-17 DIAGNOSIS — E1165 Type 2 diabetes mellitus with hyperglycemia: Secondary | ICD-10-CM | POA: Diagnosis not present

## 2014-10-17 DIAGNOSIS — E1142 Type 2 diabetes mellitus with diabetic polyneuropathy: Secondary | ICD-10-CM | POA: Diagnosis not present

## 2014-10-17 DIAGNOSIS — E11319 Type 2 diabetes mellitus with unspecified diabetic retinopathy without macular edema: Secondary | ICD-10-CM

## 2014-10-17 DIAGNOSIS — IMO0002 Reserved for concepts with insufficient information to code with codable children: Secondary | ICD-10-CM

## 2014-10-17 LAB — POCT GLUCOSE (DEVICE FOR HOME USE): Glucose Fasting, POC: 189 mg/dL — AB (ref 70–99)

## 2014-10-17 MED ORDER — DULOXETINE HCL 30 MG PO CPEP: 30.0000 mg | ORAL_CAPSULE | Freq: Every day | ORAL | Status: DC

## 2014-10-17 NOTE — Patient Instructions (Signed)
Apidra 8-10 at supper; 5 at supper  LANTUS 35 UNITS AND 45 IN PM  Please check blood sugars at least half the time about 2 hours after any meal and 3 times per week on waking up.  Please bring blood sugar monitor to each visit. Recommended blood sugar levels about 2 hours after meal is 140-180 and on waking up 90-130

## 2014-10-17 NOTE — Progress Notes (Signed)
Patient ID: [name], female   DOB: [birth year], 44 y.o.   MRN: [id number]           Reason for Appointment: Consultation for Type 2 Diabetes  Referring physician: Ham[place]  History of Present Illness:          Date of diagnosis of type 2 diabetes mellitus : age 6       Past history: At diagnosis she was hospitalized with a blood sugar of about 1000.  She was started on insulin and also tried on metformin and other oral medications.  Had side effects from metformin and this was stopped. However has been continued on insulin since then She has been generally managed by PCP for the last 15 years for diabetes and has not had any good control for several years  Recent history:   INSULIN regimen is described as: Lantus 40 units  twice a day, Apidra 5 units before each meal  She has been on large doses of Lantus and the dose has been increased progressively In April her dose was split from 80 units once a day to 40 units twice a day by her PCP She also takes mealtime insulin but does not adjust it based on what she is eating or her blood sugar patterns, takes only 5 units She has difficulty recalling her blood sugar readings and appears to have variable readings at all times His meter is very old and she does not like it She thinks her fasting readings are more consistently high and not necessarily better with splitting her Lantus to twice a day She has been to a dietitian before but does not plan her meals and does not eat consistently She thinks she gets tired and short of breath with activity and is not able to do much exercise Has not had any recent weight change recently but with the last 2 years has had difficulty losing weight  Oral hypoglycemic drugs the patient is taking are: None      Side effects from medications have been: Metformin causes nausea/diarrhea   Compliance with the medical regimen: Fair, occasionally will miss Apidra at mealtimes Hypoglycemia: Occasionally  before supper especially not eating much during the day   Glucose monitoring:  done  times a day         Glucometer: Accucheck.      Blood Glucose readings by time of day and averages from meter download:  PREMEAL Breakfast Lunch Dinner Bedtime  Overall   Glucose range: 130-250  150-200 145-275   Median:         Self-care: The diet that the patient has been following is: tries to limit fats.      Typical meal intake Breakfast: Toast and egg or cheese;  Lunch: salads; Dinner at 6-7 pm: grilled or fried chicken, potatoes               Dietician visit, most recent: years ago               Exercise: limited walking slowly upto 15 min  Weight history:  Wt Readings from Last 3 Encounters:  10/17/14 218 lb 3.2 oz (98.975 kg)  08/29/14 221 lb 6.4 oz (100.426 kg)  07/12/14 226 lb 4 oz (102.626 kg)    Glycemic control:   Lab Results  Component Value Date   HGBA1C 9.7* 09/18/2014   HGBA1C 8.3* 04/12/2013   HGBA1C 7.9* 03/13/2012   Lab Results  Component Value Date   LDLCALC 104 09/18/2014  CREATININE 0.79 09/06/2014        Medication List       This list is accurate as of: 10/17/14  1:10 PM.  Always use your most recent med list.               albuterol 108 (90 BASE) MCG/ACT inhaler  Commonly known as:  PROVENTIL HFA;VENTOLIN HFA  Inhale 2 puffs into the lungs every 6 (six) hours as needed for wheezing.     ALPRAZolam 0.25 MG tablet  Commonly known as:  XANAX  Take 1 tablet (0.25 mg total) by mouth 2 (two) times daily as needed for anxiety.     alum & mag hydroxide-simeth 400-400-40 MG/5ML suspension  Commonly known as:  MAALOX PLUS  Take by mouth every 6 (six) hours as needed for indigestion.     APIDRA 100 UNIT/ML injection  Generic drug:  insulin glulisine  Inject 5 Units into the skin 3 (three) times daily before meals.     Biotin 5000 MCG Tabs  Take 5,000 mcg by mouth daily.     buPROPion 300 MG 24 hr tablet  Commonly known as:  WELLBUTRIN XL  Take 300  mg by mouth daily before breakfast.     carvedilol 6.25 MG tablet  Commonly known as:  COREG  Take 1 tablet (6.25 mg total) by mouth 2 (two) times daily with a meal.     citalopram 40 MG tablet  Commonly known as:  CELEXA  Take 40 mg by mouth daily before breakfast.     cyclobenzaprine 10 MG tablet  Commonly known as:  FLEXERIL  Take 10 mg by mouth 3 (three) times daily as needed for muscle spasms.     DULoxetine 30 MG capsule  Commonly known as:  CYMBALTA  Take 1 capsule (30 mg total) by mouth daily.     fluticasone 50 MCG/ACT nasal spray  Commonly known as:  FLONASE  Place 1 spray into both nostrils daily.     ibuprofen 800 MG tablet  Commonly known as:  ADVIL,MOTRIN  Take 800 mg by mouth every 8 (eight) hours as needed (pain).     ivabradine 5 MG Tabs tablet  Commonly known as:  CORLANOR  Take 1 tablet (5 mg total) by mouth 2 (two) times daily with a meal.     LANTUS SOLOSTAR 100 UNIT/ML Solostar Pen  Generic drug:  Insulin Glargine  Inject 80 Units into the skin daily.     lidocaine 5 %  Commonly known as:  LIDODERM  Place 1 patch onto the skin daily as needed (for back pain).     lubiprostone 24 MCG capsule  Commonly known as:  AMITIZA  Take 24 mcg by mouth 2 (two) times daily as needed for constipation.     oxyCODONE-acetaminophen 10-325 MG per tablet  Commonly known as:  PERCOCET  Take 1 tablet by mouth every 4 (four) hours as needed for pain.     potassium chloride SA 20 MEQ tablet  Commonly known as:  KLOR-CON M20  Take 1 tablet (20 mEq total) by mouth daily.     pregabalin 150 MG capsule  Commonly known as:  LYRICA  Take 150 mg by mouth 3 (three) times daily.     ranitidine 150 MG tablet  Commonly known as:  ZANTAC  Take 150 mg by mouth daily.     sacubitril-valsartan 97-103 MG  Commonly known as:  ENTRESTO  Take 1 tablet by mouth 2 (two) times daily.  spironolactone 25 MG tablet  Commonly known as:  ALDACTONE  Take 1 tablet (25 mg total)  by mouth daily.     torsemide 20 MG tablet  Commonly known as:  DEMADEX  Take 3 tablets (60 mg total) by mouth daily.        Allergies:  Allergies  Allergen Reactions  . Imitrex [Sumatriptan] Other (See Comments)    Acid reflux    Past Medical History  Diagnosis Date  . Asthma   . GERD (gastroesophageal reflux disease)   . Diabetes mellitus   . Hyperlipidemia   . Migraines   . Hypertension   . Back pain, chronic   . Depression   . Neuropathy   . Anxiety   . Nervous breakdown sept 2013  . Fibromyalgia   . History of gastric bypass Jan 2013  . Insomnia   . Wound of left breast   . Nonischemic cardiomyopathy   . Systolic CHF, acute on chronic     CARDIOLOGIST-- DR Aris Lot NELSON  ([facility])  . Shortness of breath     with exertion   . Bipolar disorder   . Arthritis     neck and shoulders   . Anemia     hx of iron transfusions - last one 10/2013   . Peripheral vascular disease   . Carpal tunnel syndrome on both sides     Past Surgical History  Procedure Laterality Date  . Back surgery  November 1992  . Cesarean section  December 1998  . Tubal ligation  January 1999  . Cholecystectomy  1998  . Gastric roux-en-y  06/30/2011    Procedure: LAPAROSCOPIC ROUX-EN-Y GASTRIC;  Surgeon: Mariella Saa, MD;  Location: [facility];  Service: General;  Laterality: N/A;     . Esophagogastroduodenoscopy  07/27/2011    Procedure: ESOPHAGOGASTRODUODENOSCOPY (EGD);  Surgeon: Freddy Jaksch, MD;  Location: [facility];  Service: Endoscopy;  Laterality: N/A;  . Irrigation and debridement abscess Left 11/26/2013    Procedure: IRRIGATION AND DEBRIDEMENT ABSCESS;  Surgeon: Velora Heckler, MD;  Location: [facility];  Service: General;  Laterality: Left;  . Cardiac catheterization  11-14-2013  DR [facility]    NORMAL LV FILLING PRESSURE W/ MILD ELEVATED RV FILLING, POSSIBLY SUFFESTING RV FAILURE OUT OF PROPORTION TO LV FAILURE. EF ABOUT 35% WITH DIFFUSE HYPOKINESIS. NO ANGIOGRAPHIC CAD,  NONISCHEMIC CARDIOMYOPATHY  . Incision and drainage of wound Left 12/14/2013    Procedure: IRRIGATION AND DEBRIDEMENT OF LEFT BREAST WOUND WITH PLACEMENT OF A-CELL, WITH CLOSURE ;  Surgeon: Wayland Denis, DO;  Location: [facility];  Service: Plastics;  Laterality: Left;  . Breast surgery Left     I&D for abcsess   . Spinal cord stimulator insertion N/A 03/09/2014    Procedure: LUMBAR SPINAL CORD STIMULATOR INSERTION;  Surgeon: Gwynne Edinger, MD;  Location: [facility];  Service: Neurosurgery;  Laterality: N/A;  . Dilitation & currettage/hystroscopy with novasure ablation N/A 02/26/2014    Procedure: DILATATION & CURETTAGE/HYSTEROSCOPY WITH NOVASURE ABLATION;  Surgeon: Meriel Pica, MD;  Location: [facility];  Service: Gynecology;  Laterality: N/A;  . Left and right heart catheterization with coronary angiogram N/A 11/14/2013    Procedure: LEFT AND RIGHT HEART CATHETERIZATION WITH CORONARY ANGIOGRAM;  Surgeon: Laurey Morale, MD;  Location: [facility] CATH LAB;  Service: Cardiovascular;  Laterality: N/A;    Family History  Problem Relation Age of Onset  . Diabetes Maternal Aunt   . Diabetes Paternal Aunt   . Diabetes Paternal Grandfather   .  Heart attack Neg Hx   . Stroke Neg Hx   . Hypertension Paternal Grandfather   . Diabetes Paternal Grandmother     Social History:  reports that she has never smoked. She has never used smokeless tobacco. She reports that she does not drink alcohol or use illicit drugs.    Review of Systems    Lipid history: She is not on any specific treatment for hyperlipidemia    Lab Results  Component Value Date   CHOL 204* 03/20/2011   HDL 64 03/20/2011   LDLCALC 104 09/18/2014   TRIG 114 03/20/2011   CHOLHDL 3.2 03/20/2011           Constitutional: no recent weight gain/loss, no complaints of unusual fatigue   Eyes: no history of blurred vision. Retinopathy present Most recent eye exam was 3/16    ENT: no nasal congestion, difficulty swallowing, no hoarseness     Cardiovascular: no chest pain or tightness on exertion.  No leg swelling.  Hypertension:  Respiratory: no cough but will get short of breath on moderate exertion  Gastrointestinal: no constipation, diarrhea, nausea or abdominal pain  Musculoskeletal:  joint aches and back pain present   Urological:   No frequency of urination or  nocturia  Skin: no rash.  She tends to get sores on the anterior legs  Neurological: no headaches.  Has long-standing numbness, burning, sharp pains  in lower legs and feet.  She has some relief with Lyrica which she has been taking for quite some time but still has significant pain    Psychiatric:  symptoms of depression present and on treatment for this for years.  Still feels stressed frequently  Endocrine: No unusual fatigue, cold intolerance.  No history of thyroid disease   LABS:  Office Visit on 10/17/2014  Component Date Value Ref Range Status  . LDL Cholesterol 09/18/2014 104   Final  . Hgb A1c MFr Bld 09/18/2014 9.7* 4.0 - 6.0 % Final  . Glucose Fasting, POC 10/17/2014 189* 70 - 99 mg/dL Final    Physical Examination:  BP 138/88 mmHg  Pulse 67  Temp(Src) 97.7 F (36.5 C)  Resp 16  Ht  (1.651 m)  Wt 218 lb 3.2 oz (98.975 kg)  BMI 36.31 kg/m2  SpO2 97%  GENERAL:         Patient has generalized obesity.   HEENT:         Eye exam shows normal external appearance. Fundus exam shows exudates on the left side, details not well seen but no other signs of retinopathy seen. Oral exam shows normal mucosa .  NECK:         General:  Neck exam shows no lymphadenopathy. Carotids are normal to palpation and no bruit heard.  Thyroid is not enlarged and no nodules felt.   LUNGS:         Chest is symmetrical. Lungs are clear to auscultation.Marland Kitchen   HEART:         Heart sounds:  S1 and S2 are normal. No murmurs or clicks heard., no S3 or S4.   ABDOMEN:   There is no distention present. Liver and spleen are not palpable. No other mass or tenderness  present.  EXTREMITIES:     There is no edema. No skin lesions present.Marland Kitchen  NEUROLOGICAL:   Vibration sense is markedly reduced in left toes and practically absent on the right. Ankle jerks are absent bilaterally and biceps reflexes are 1+ .  Diabetic foot exam shows absent monofilament sensation in the toes and plantar surfaces, no skin lesions or ulcers on the feet and normal pedal pulses  MUSCULOSKELETAL:  There is no swelling or deformity of the peripheral joints. Spine is normal to inspection.   SKIN:      Shin spots present with pigmentation.    No rash     ASSESSMENT:  Diabetes ?  type 1, uncontrolled with persistent hyperglycemia Her blood sugar control appears to have been worse in the last few months as judged by her A1c of 9.7-10%    Difficult to assess her home but sugar patterns as she does not remember her readings accurately and appears to have significant variability also She also has a relatively old glucose monitor Currently taking large doses of LANTUS insulin but minimal mealtime coverage; also does not appear to be having adequate control of fasting blood sugars even with splitting her Lantus to twice a day She probably also does need larger doses of mealtime coverage at her supper which is her main meal and she is not usually eating much carbohydrates at other times Also discussed carbohydrate counting to help adjust mealtime doses and she will need to be educated regarding this.  She thinks she can do this and has previous knowledge about this   Complications: Peripheral neuropathy with symptoms, retinopathy probably nonproliferative.  No history of microalbuminuria  History of cardiomyopathy, depression managed by cardiologist and PCP  PLAN:   Increase evening Lantus to 45 units but reduced it morning dose to 35 to avoid daytime hypoglycemia especially if she is not eating much  Increase suppertime dose to 8-10 units of Apidra and consider further increases  based on postprandial pattern  Consultation with nurse educator  She will be starting on a new Accu-Chek nano monitor and discussed timing of glucose monitoring and targets  C-peptide level to evaluate her for insulin pump  Given her basic information on Omnipod and Medtronic insulin pumps to review and consider more detailed education on the next visit  Consultation with dietitian for meal planning and carbohydrate counting  Given booklet on foot care   trial of CYMBALTA 30 mg daily for neuropathic pain , may be able to reduce her Lyrica with this.  She will increase the dose to 60 mg and next visit if symptomatically improved    Patient Instructions  Apidra 8-10 at supper; 5 at supper  LANTUS 35 UNITS AND 45 IN PM  Please check blood sugars at least half the time about 2 hours after any meal and 3 times per week on waking up.  Please bring blood sugar monitor to each visit. Recommended blood sugar levels about 2 hours after meal is 140-180 and on waking up 90-130   Counseling time on subjects discussed above is over 50% of today's 60 minute visit   Emmit Oriley 10/17/2014, 1:10 PM   Note: This office note was prepared with Insurance underwriter. Any transcriptional errors that result from this process are unintentional.

## 2014-10-18 ENCOUNTER — Ambulatory Visit: Payer: Commercial Managed Care - HMO

## 2014-10-19 LAB — C-PEPTIDE: C-Peptide: 2.5 ng/mL (ref 1.1–4.4)

## 2014-10-19 NOTE — Progress Notes (Signed)
Quick Note:  Please let patient know that the C-peptide is not low and will not qualify for insulin pump  ______

## 2014-10-22 ENCOUNTER — Other Ambulatory Visit: Payer: Self-pay

## 2014-10-22 ENCOUNTER — Telehealth: Payer: Self-pay | Admitting: Endocrinology

## 2014-10-22 ENCOUNTER — Other Ambulatory Visit: Payer: Commercial Managed Care - HMO | Admitting: *Deleted

## 2014-10-22 NOTE — Patient Outreach (Signed)
[facility]) Care Management  10/22/2014  [name] [birth year] [id number]  Health Coach telephonic assessment completed.  Patient had been seen by [facility] Nurse until 09-25-14, after which she was referred to Disease Management.  She is also followed by [facility] Child psychotherapist.  Patient has a history of not taking her medications as ordered.  She was provided with 2 pill boxes to simplify this, but she states the PM pill box is small and will not hold her evening medications.  Health Coach will mail large pill box to her.  Patient also has difficulty with following a diabetic diet.  She states that Dr. Lucianne Muss is arranging for a dietary consult.  Health Coach will mail educational materials on diabetic diet guidelines.  Education was provided on decreasing simple carbohydrates and reading labels on food items.  Her current weight is 218 pounds.  She expressed a desire to lose weight.  We discussed setting realistic goals.  She wants to lose 5 pounds within a month.  Encouraged her to buy a scale to monitor her weight, and to keep a food diary.  Appointment made for follow-up call in approximately 1 month.  [name], RN, MSN [facility] Health Coach [phone] Fax [phone]

## 2014-10-22 NOTE — Telephone Encounter (Signed)
Please advise 

## 2014-10-22 NOTE — Patient Outreach (Signed)
[facility]) Care Management  10/22/2014  [name] 08-04-1970 [id number]   CSW made a second attempt to try and contact patient today, without success. CSW left a HIPAA compliant message for patient on voicemail and is currently awaiting a return call.  CSW will also continue to try and make contact with patient.  Danford Bad, BSW, MSW, LCSW [facility] [address] [place], [address] [place], [place] [address] [email or web address] [phone]

## 2014-10-22 NOTE — Telephone Encounter (Signed)
Need to see her in about 2 weeks.  There is a result note regarding C-peptide level not being low and this disqualifies her from insulin pump from [facility]

## 2014-10-22 NOTE — Telephone Encounter (Signed)
Pt called wanting to know when she needed to schedule her follow up apt based on her lab results , i did not see where she had lab work done will you please advise pt

## 2014-10-22 NOTE — Telephone Encounter (Signed)
lmtcb

## 2014-10-23 DIAGNOSIS — G5602 Carpal tunnel syndrome, left upper limb: Secondary | ICD-10-CM | POA: Diagnosis not present

## 2014-10-24 ENCOUNTER — Other Ambulatory Visit: Payer: Self-pay | Admitting: Cardiology

## 2014-10-24 NOTE — Telephone Encounter (Signed)
PCP should refill this.

## 2014-10-25 ENCOUNTER — Encounter: Payer: Self-pay | Admitting: *Deleted

## 2014-10-25 ENCOUNTER — Other Ambulatory Visit: Payer: Self-pay | Admitting: *Deleted

## 2014-10-25 NOTE — Patient Outreach (Signed)
[facility]) Care Management  [facility] Social Work  10/25/2014  [name] 1970/10/05 [id number]  Subjective:    Objective:   Current Medications:  Current Outpatient Prescriptions  Medication Sig Dispense Refill  . albuterol (PROVENTIL HFA;VENTOLIN HFA) 108 (90 BASE) MCG/ACT inhaler Inhale 2 puffs into the lungs every 6 (six) hours as needed for wheezing.     Marland Kitchen ALPRAZolam (XANAX) 0.25 MG tablet Take 1 tablet (0.25 mg total) by mouth 2 (two) times daily as needed for anxiety. 60 tablet 0  . alum & mag hydroxide-simeth (MAALOX PLUS) 400-400-40 MG/5ML suspension Take by mouth every 6 (six) hours as needed for indigestion.    . Biotin 5000 MCG TABS Take 5,000 mcg by mouth daily.    Marland Kitchen buPROPion (WELLBUTRIN XL) 300 MG 24 hr tablet Take 300 mg by mouth daily before breakfast.     . carvedilol (COREG) 6.25 MG tablet Take 1 tablet (6.25 mg total) by mouth 2 (two) times daily with a meal. 60 tablet 3  . citalopram (CELEXA) 40 MG tablet Take 40 mg by mouth daily before breakfast.     . cyclobenzaprine (FLEXERIL) 10 MG tablet Take 10 mg by mouth 3 (three) times daily as needed for muscle spasms.     . DULoxetine (CYMBALTA) 30 MG capsule Take 1 capsule (30 mg total) by mouth daily. 30 capsule 1  . fluticasone (FLONASE) 50 MCG/ACT nasal spray Place 1 spray into both nostrils daily.     Marland Kitchen ibuprofen (ADVIL,MOTRIN) 800 MG tablet Take 800 mg by mouth every 8 (eight) hours as needed (pain).     . Insulin Glargine (LANTUS SOLOSTAR) 100 UNIT/ML Solostar Pen Inject 80 Units into the skin daily.     . insulin glulisine (APIDRA) 100 UNIT/ML injection Inject 5 Units into the skin 3 (three) times daily before meals.    . ivabradine (CORLANOR) 5 MG TABS tablet Take 1 tablet (5 mg total) by mouth 2 (two) times daily with a meal. 90 tablet 3  . lidocaine (LIDODERM) 5 % Place 1 patch onto the skin daily as needed (for back pain).     . lubiprostone (AMITIZA) 24 MCG capsule Take 24 mcg by mouth  2 (two) times daily as needed for constipation.    Marland Kitchen oxyCODONE-acetaminophen (PERCOCET) 10-325 MG per tablet Take 1 tablet by mouth every 4 (four) hours as needed for pain. (Patient taking differently: Take 0.5-1 tablets by mouth every 4 (four) hours as needed for pain. ) 30 tablet 0  . potassium chloride SA (KLOR-CON M20) 20 MEQ tablet Take 1 tablet (20 mEq total) by mouth daily. 90 tablet 6  . pregabalin (LYRICA) 150 MG capsule Take 150 mg by mouth 3 (three) times daily.    . ranitidine (ZANTAC) 150 MG tablet Take 150 mg by mouth daily.    . sacubitril-valsartan (ENTRESTO) 97-103 MG Take 1 tablet by mouth 2 (two) times daily. 60 tablet 3  . spironolactone (ALDACTONE) 25 MG tablet Take 1 tablet (25 mg total) by mouth daily. 90 tablet 6  . torsemide (DEMADEX) 20 MG tablet Take 3 tablets (60 mg total) by mouth daily. 90 tablet 3   No current facility-administered medications for this visit.    Functional Status:  In your present state of health, do you have any difficulty performing the following activities: 10/22/2014 09/25/2014  Hearing? N (No Data)  Vision? N -  Difficulty concentrating or making decisions? N -  Walking or climbing stairs? N -  Dressing or bathing?  N -  Doing errands, shopping? N -  Preparing Food and eating ? N -  Using the Toilet? N -  In the past six months, have you accidently leaked urine? N -  Do you have problems with loss of bowel control? N -  Managing your Medications? N -  Managing your Finances? N -  Housekeeping or managing your Housekeeping? N -    Fall/Depression Screening:  PHQ 2/9 Scores 10/22/2014 09/25/2014 09/12/2014 12/27/2013  PHQ - 2 Score 2 2 - 0  PHQ- 9 Score 4 7 - -  Exception Documentation Other- indicate reason in comment box (No Data) Patient refusal -    Assessment:   CSW was able to make initial contact with patient today, after patient was transferred to CSW's caseload on May 3rd.  CSW had left a HIPAA compliant message for patient on  voicemail on May 3rd, as well as May 9th, encouraging patient to return CSW's call at her earliest convenience. CSW did not receive a return call.  When CSW was able to make contact with patient today, CSW introduced self, explained role and types of services provided through [employer].  CSW went on to explain the reason for the call, indicating that CSW was calling to assist patient with obtaining prescription glasses through the [employer].  Patient voiced understanding. Before proceeding any further with the phone conversation, CSW was able to obtain two HIPAA compliant identifiers from patient, which included patient's name and date of birth.  CSW was also able to get patient to provide verbal consent for CSW to make referrals for patient to various community agencies and resources, if necessary.  Patient admits that she has been awaiting a return call from someone with [employer] to check the status of her application for prescription lenses.   CSW explained to patient that CSW was going to put patient in direct contact with [name], Representative with the [employer], as the [employer] do not currently have these types of resources available.  CSW further explained to patient that according to [name], patient's lenses are available for pick-up, requesting that patient contact [name] to schedule a type to meet to deliver the glasses.  Patient was most appreciative of all assistance provided, agreeing to contact [name] as soon as her call with CSW is terminated. CSW inquired as to how CSW could be of further assistance to patient at this time.  Patient denied being able to identify any additional social work needs or services.  CSW explained to patient that CSW would be performing a case closure, unless otherwise specified.  CSW provided patient with CSW's contact information, encouraging patient to  contact CSW if social work needs arise in the near future.  Patient was agreeable to this plan.  Plan:   CSW will perform a case closure on patient, as all goals of treatment have been met from social work standpoint and no additional social work needs have been identified at this time. CSW will prescribe and print EMMI information for patient and mail to patient's home for her review on her health and current medical conditions. CSW will fax a correspondence letter to patient's Primary Care Physician, Dr. Daiva Eves to ensure that Dr. Lisbeth Ply is aware of CSW's involvement with patient, as well as notify Dr. Lisbeth Ply of CSW's plans to perform a case closure on patient. CSW will submit a case closure request to [facility]  Laurance Flatten, Care Management Assistant with [facility], in the form on an [employer].  Nat Christen, BSW, MSW, [facility] [place], [place], [place] [address] [email or web address] [phone]

## 2014-11-02 ENCOUNTER — Other Ambulatory Visit: Payer: Self-pay | Admitting: Cardiology

## 2014-11-06 ENCOUNTER — Ambulatory Visit: Payer: Commercial Managed Care - HMO | Admitting: Endocrinology

## 2014-11-13 ENCOUNTER — Ambulatory Visit (HOSPITAL_BASED_OUTPATIENT_CLINIC_OR_DEPARTMENT_OTHER): Payer: Commercial Managed Care - HMO | Attending: Internal Medicine

## 2014-11-13 DIAGNOSIS — G4719 Other hypersomnia: Secondary | ICD-10-CM

## 2014-11-13 DIAGNOSIS — R5383 Other fatigue: Secondary | ICD-10-CM | POA: Diagnosis not present

## 2014-11-13 DIAGNOSIS — R0683 Snoring: Secondary | ICD-10-CM | POA: Diagnosis not present

## 2014-11-13 DIAGNOSIS — I5022 Chronic systolic (congestive) heart failure: Secondary | ICD-10-CM

## 2014-11-19 ENCOUNTER — Ambulatory Visit: Payer: Commercial Managed Care - HMO

## 2014-11-20 ENCOUNTER — Other Ambulatory Visit: Payer: Self-pay

## 2014-11-20 NOTE — Patient Outreach (Signed)
[facility]) Care Management  11/20/2014  [name] July 05, 1970 [id number]   Telephone to patient as scheduled.  Patient requests call back after 3 PM this afternoon. Health Coach will call back this afternoon as requested.  [name], RN, MSN [facility] Health Coach [phone] Fax [phone]

## 2014-11-20 NOTE — Patient Outreach (Signed)
[facility]) Care Management  11/20/2014  [name] 06/17/1970 [id number]   Telephonic assessment today for self-management of diabetes.  Patient goals include weight loss, and she reports she has lost 3 pounds since last month.  She still plans to buy a scale so that she can monitor her weight.  Encouraged her to keep a food diary, and to review the materials she received on diabetic diet guidelines to discuss next time.  She did report that she is eating less bread and focusing on eating more vegetables.  Patient states she has been taking her blood sugar each day, but needs additional reinforcement of teaching to keep a written log.  Encouraged her to use the record in her [facility] Calendar to document her cbg and her weight daily.  She did state she had one hypoglycemic episode.  She was able to verbalize signs/symptoms of hypoglycemia and action plan to resolve it.  Patient goal to increase her adherence to her medication regimen is met.  She reports with the use of her medication boxes she is now taking all of her medications as ordered.  Health Coach will follow up in approximately one month.  Candie Mile, RN, MSN [place] [phone] Fax [phone]

## 2014-11-21 DIAGNOSIS — M546 Pain in thoracic spine: Secondary | ICD-10-CM | POA: Diagnosis not present

## 2014-11-21 DIAGNOSIS — M5136 Other intervertebral disc degeneration, lumbar region: Secondary | ICD-10-CM | POA: Diagnosis not present

## 2014-11-21 DIAGNOSIS — Z6841 Body Mass Index (BMI) 40.0 and over, adult: Secondary | ICD-10-CM | POA: Diagnosis not present

## 2014-11-21 DIAGNOSIS — M961 Postlaminectomy syndrome, not elsewhere classified: Secondary | ICD-10-CM | POA: Diagnosis not present

## 2014-11-21 DIAGNOSIS — M5416 Radiculopathy, lumbar region: Secondary | ICD-10-CM | POA: Diagnosis not present

## 2014-11-25 ENCOUNTER — Other Ambulatory Visit: Payer: Self-pay | Admitting: Cardiology

## 2014-11-25 ENCOUNTER — Other Ambulatory Visit: Payer: Self-pay | Admitting: Endocrinology

## 2014-11-29 ENCOUNTER — Telehealth: Payer: Self-pay | Admitting: Cardiology

## 2014-11-29 DIAGNOSIS — G4719 Other hypersomnia: Secondary | ICD-10-CM | POA: Insufficient documentation

## 2014-11-29 DIAGNOSIS — R0683 Snoring: Secondary | ICD-10-CM | POA: Insufficient documentation

## 2014-11-29 NOTE — Telephone Encounter (Signed)
Patient has minimal OSA and mild to moderate snoring.  CPAP not recommended for this degree of minimal OSA>  Please set up appt with me to discuss options.

## 2014-11-29 NOTE — Sleep Study (Signed)
   NAME: [name] DATE OF BIRTH:  08-17-1970 MEDICAL RECORD NUMBER [id number]  LOCATION: [facility]  PHYSICIAN: TURNER,TRACI R  DATE OF STUDY: 11/13/2014  SLEEP STUDY TYPE: Nocturnal Polysomnogram               REFERRING PHYSICIAN: Bensimhon, Bevelyn Buckles, MD  INDICATION FOR STUDY: Snoring, excessive daytime sleepiness, am headaches  EPWORTH SLEEPINESS SCORE: 24 HEIGHT: 5'5.5" WEIGHT: 218 lbs BMI: 36     NECK SIZE: 16 in.  MEDICATIONS: Reviewed in the chart  SLEEP ARCHITECTURE: The patient slept for a total of 293 minutes out of a total sleep period time of 335 minutes.  There was no slow wave sleep and 37 minutes of REM sleep.  The onset to sleep latency was delayed at 34 minutes.  The onset to REM sleep latency was normal at 106 minutes.  The sleep efficiency was reduced at 79%.   RESPIRATORY DATA: There were a total of 27 hypopneas and no apneas.  Most of the events occurred in the supine position in NREM sleep.  The AHI was minimally elevated at 5.5 events per hour.  The RDI was normal at 6.3 events per hour.  There was mild to moderate snoring noted.    OXYGEN DATA: The average oxygen saturation was 96%.  The lowest oxygen saturation was 87%.  The time spent with oxygen saturations < 88% was 0.1 minutes.    CARDIAC DATA: The patient maintained NSR with PVC's.  The average heart rate was 74 bpm.  The lowest heart rate was 35 bpm and the highest heart rate was 139 minutes.    MOVEMENT/PARASOMNIA: There were no periodic limb movements andn o REM sleep behavior disorders noted.   IMPRESSION/ RECOMMENDATION:   1.  Minimal sleep apnea/hypopnea syndrome was noted during the study.  The AHI was 5.5 events per hour.  Most of the events occurred in the supine position in NREM sleep 2.  Abnormal sleep architecture with no slow wave sleep. 3.  Reduced sleep efficiency with increased frequency of arousals due to spontaneous events. 4.  Minimal oxygen  desaturations were noted with respiratory events. 5.  Mild to moderate snoring was noted. 6.  PVC's were noted during the study. 7.  Given the very mild degree of OSA, there is no indication for CPAP therapy at this time.  Given patient's history of snoring and minimal OSA, consider referral to ENT for evaluation of surgical treatments of very mild OSA and snoring.  Other options for treatment include referral to dentistry for oral device.  Treatment would also include careful attention to proper sleep hygiene, weight reduction for elevated BMI, avoidance of sleeping in the supine position and avoidance of alcohol within four hours of bedtime.  Specific treatment decisions should be tailored to each patient based upon the clinical situation and all treatment options should be considered.  The patient should be instructed to avoid driving if sleepy.   Signed: Quintella Reichert Diplomate, American Board of Sleep Medicine  ELECTRONICALLY SIGNED ON:  11/29/2014, 8:31 PM [facility] PH: [phone]   FX: [phone] ACCREDITED BY THE AMERICAN ACADEMY OF SLEEP MEDICINE

## 2014-11-30 NOTE — Telephone Encounter (Signed)
Patient is aware.   Appointment scheduled for 02/22/15 815am

## 2014-12-14 ENCOUNTER — Telehealth: Payer: Self-pay | Admitting: Hematology

## 2014-12-14 NOTE — Telephone Encounter (Signed)
Due to PAL moved 7/22 f/u to 7/20. Lab on 7/15 remains the same. Spoke with patient she is aware.

## 2014-12-18 ENCOUNTER — Other Ambulatory Visit: Payer: Self-pay

## 2014-12-18 NOTE — Patient Outreach (Signed)
[facility]) Care Management  [place]  12/18/2014   [name] [birth year] [id number]  Subjective: Patient reports she is checking blood sugars most days, but did not have her record with her when we spoke.  Reports range from 34 to 210.  Reports she has skipped some meals trying to lose weight, and that has resulted in hypoglycemic episodes. She was able to discuss treatment of hypoglycemia.  Reports she has lost 3 more pounds in the last month- down to 214 pounds- but she still does not have a scale to use at home. Reports taking her medications as ordered, except for pain medication which she does not take if she is going to be driving.  Objective: Phone assessment cut short today due to dropped call- unable to get her back to complete the assessment.  Current Medications:  Current Outpatient Prescriptions  Medication Sig Dispense Refill  . albuterol (PROVENTIL HFA;VENTOLIN HFA) 108 (90 BASE) MCG/ACT inhaler Inhale 2 puffs into the lungs every 6 (six) hours as needed for wheezing.     Marland Kitchen ALPRAZolam (XANAX) 0.25 MG tablet Take 1 tablet (0.25 mg total) by mouth 2 (two) times daily as needed for anxiety. 60 tablet 0  . alum & mag hydroxide-simeth (MAALOX PLUS) 400-400-40 MG/5ML suspension Take by mouth every 6 (six) hours as needed for indigestion.    . Biotin 5000 MCG TABS Take 5,000 mcg by mouth daily.    Marland Kitchen buPROPion (WELLBUTRIN XL) 300 MG 24 hr tablet Take 300 mg by mouth daily before breakfast.     . carvedilol (COREG) 6.25 MG tablet Take 1 tablet (6.25 mg total) by mouth 2 (two) times daily with a meal. 60 tablet 3  . citalopram (CELEXA) 40 MG tablet Take 40 mg by mouth daily before breakfast.     . cyclobenzaprine (FLEXERIL) 10 MG tablet Take 10 mg by mouth 3 (three) times daily as needed for muscle spasms.     . DULoxetine (CYMBALTA) 30 MG capsule Take 1 capsule (30 mg total) by mouth daily. 30 capsule 1  . fluticasone (FLONASE) 50 MCG/ACT nasal spray  Place 1 spray into both nostrils daily.     Marland Kitchen ibuprofen (ADVIL,MOTRIN) 800 MG tablet Take 800 mg by mouth every 8 (eight) hours as needed (pain).     . Insulin Glargine (LANTUS SOLOSTAR) 100 UNIT/ML Solostar Pen Inject 80 Units into the skin daily.     . insulin glulisine (APIDRA) 100 UNIT/ML injection Inject 5 Units into the skin 3 (three) times daily before meals.    . ivabradine (CORLANOR) 5 MG TABS tablet Take 1 tablet (5 mg total) by mouth 2 (two) times daily with a meal. 90 tablet 3  . lidocaine (LIDODERM) 5 % Place 1 patch onto the skin daily as needed (for back pain).     . lubiprostone (AMITIZA) 24 MCG capsule Take 24 mcg by mouth 2 (two) times daily as needed for constipation.    Marland Kitchen oxyCODONE-acetaminophen (PERCOCET) 10-325 MG per tablet Take 1 tablet by mouth every 4 (four) hours as needed for pain. (Patient taking differently: Take 0.5-1 tablets by mouth every 4 (four) hours as needed for pain. ) 30 tablet 0  . potassium chloride SA (KLOR-CON M20) 20 MEQ tablet Take 1 tablet (20 mEq total) by mouth daily. 90 tablet 6  . pregabalin (LYRICA) 150 MG capsule Take 150 mg by mouth 3 (three) times daily.    . ranitidine (ZANTAC) 150 MG tablet Take 150 mg by mouth  daily.    . sacubitril-valsartan (ENTRESTO) 97-103 MG Take 1 tablet by mouth 2 (two) times daily. 60 tablet 3  . spironolactone (ALDACTONE) 25 MG tablet Take 1 tablet (25 mg total) by mouth daily. 90 tablet 6  . torsemide (DEMADEX) 20 MG tablet Take 3 tablets (60 mg total) by mouth daily. 90 tablet 3   No current facility-administered medications for this visit.    Functional Status:  In your present state of health, do you have any difficulty performing the following activities: 12/18/2014 12/18/2014  Hearing? N N  Vision? N -  Difficulty concentrating or making decisions? N N  Walking or climbing stairs? N N  Dressing or bathing? N N  Doing errands, shopping? N N  Preparing Food and eating ? N -  Using the Toilet? N -  In the  past six months, have you accidently leaked urine? N -  Do you have problems with loss of bowel control? N -  Managing your Medications? N -  Managing your Finances? N -  Housekeeping or managing your Housekeeping? N -    Fall/Depression Screening: PHQ 2/9 Scores 12/18/2014 10/22/2014 09/25/2014 09/12/2014 12/27/2013  PHQ - 2 Score 0 2 2 - 0  PHQ- 9 Score - 4 7 - -  Exception Documentation - Other- indicate reason in comment box (No Data) Patient refusal -   [facility] CM Care Plan Problem One        Patient Outreach Telephone from 12/18/2014 in [place] Problem One  Medication Adherence   Care Plan for Problem One  Active   Interventions for Problem One Long Term Goal  -- [Patient reports she is taking meds as directed. ]   THN CM Short Term Goal #1 Met Date  09/25/14    [facility] CM Care Plan Problem Two        Patient Outreach Telephone from 12/18/2014 in [place] Problem Two  [place] for Problem Two  Not Active   THN Long Term Goal (31-90) days  Patient will develop a budget with intent to pay for all medical expenses for 60 days    THN CM Short Term Goal #1 (0-30 days)  Patient will be able to afford food that meets her nutrtional requirements for the next 30 days    THN CM Short Term Goal #1 Met Date   09/25/14   THN CM Short Term Goal #2 (0-30 days)  Patient will apply for assitance with glasses within 30 days   THN CM Short Term Goal #2 Met Date  10/25/14    [facility] CM Care Plan Problem Three        Patient Outreach Telephone from 12/18/2014 in [place] Problem Three  Lack of adherence to Diabetes self-management guidelines.   Care Plan for Problem Three  Active   THN CM Short Term Goal #1 (0-30 days)  Patient will be taking all medications as ordered within 30 days.   THN CM Short Term Goal #1 Start Date  10/22/14   [facility] CM Short Term Goal #1 Met Date  11/20/14   Interventions for Short Term Goal #1  --  [Patient reports she is taking all meds as ordered.]   THN CM Short Term Goal #2 (0-30 days)  Within 30 days patient will be able to discuss 3 guidelines for diabetic diet.   THN CM Short Term Goal #2 Start Date  10/22/14   Interventions for Short Term Goal #2  Health coach provided instruction on carbohydrate intake, and encouraged her to review educational materials on diabetic diet guidelines before our next contact. [Patient states she needs more time to study diet materials.]   THN CM Short Term Goal #3 (0-30 days)  Patient goal is to loose 5 pounds in the next 30 days.   THN  CM Short Term Goal #3 Start Date  10/22/14   Interventions for Short Term Goal #3  Patient has lost 3 pounds in the last month.  Encouraged her to continue efforts to monitor her intake.  She has not yet started a food diary- encouraged her to do so, and to buy scales if possible to monitor her weight.   THN CM Short Term Goal #4 (0-30 days)  Patient will check her blood sugar daily and keep a record of readings for the next 30 days.   THN CM Short Term Goal #4 Start Date  10/22/14   Interventions for Short Term Goal #4  Reinforced teaching to check daily blood sugars.  She states she has been checking, but could not locate her record.  Encouraged her to keep daily log in the [facility] Calendar sent to her previously.     Assessment: Self-management of diabetes not optimal.  Will benefit with on-going involvement of [place].  Plan: Schedule follow-up assessment for approximately one month to assess blood sugar monitoring and                    diabetic diet adherence.           Mail EMMI materials on Diabetes management.   Candie Mile, RN, MSN [place] [phone] Fax [phone]

## 2014-12-24 DIAGNOSIS — E785 Hyperlipidemia, unspecified: Secondary | ICD-10-CM | POA: Diagnosis not present

## 2014-12-24 DIAGNOSIS — F329 Major depressive disorder, single episode, unspecified: Secondary | ICD-10-CM | POA: Diagnosis not present

## 2014-12-24 DIAGNOSIS — I509 Heart failure, unspecified: Secondary | ICD-10-CM | POA: Diagnosis not present

## 2014-12-24 DIAGNOSIS — I10 Essential (primary) hypertension: Secondary | ICD-10-CM | POA: Diagnosis not present

## 2014-12-24 DIAGNOSIS — E11319 Type 2 diabetes mellitus with unspecified diabetic retinopathy without macular edema: Secondary | ICD-10-CM | POA: Diagnosis not present

## 2014-12-24 DIAGNOSIS — Z6835 Body mass index (BMI) 35.0-35.9, adult: Secondary | ICD-10-CM | POA: Diagnosis not present

## 2014-12-24 DIAGNOSIS — E559 Vitamin D deficiency, unspecified: Secondary | ICD-10-CM | POA: Diagnosis not present

## 2014-12-25 ENCOUNTER — Other Ambulatory Visit (HOSPITAL_COMMUNITY): Payer: Self-pay | Admitting: Internal Medicine

## 2014-12-25 ENCOUNTER — Other Ambulatory Visit: Payer: Self-pay | Admitting: Endocrinology

## 2014-12-25 ENCOUNTER — Other Ambulatory Visit: Payer: Self-pay | Admitting: Cardiology

## 2014-12-28 ENCOUNTER — Other Ambulatory Visit: Payer: Commercial Managed Care - HMO

## 2014-12-28 ENCOUNTER — Ambulatory Visit: Payer: Commercial Managed Care - HMO | Admitting: Hematology

## 2015-01-02 ENCOUNTER — Encounter: Payer: Commercial Managed Care - HMO | Admitting: Hematology

## 2015-01-02 NOTE — Progress Notes (Signed)
No show  This encounter was created in error - please disregard.

## 2015-01-04 ENCOUNTER — Ambulatory Visit: Payer: Commercial Managed Care - HMO | Admitting: Hematology

## 2015-01-04 ENCOUNTER — Telehealth: Payer: Self-pay | Admitting: Hematology

## 2015-01-04 NOTE — Telephone Encounter (Signed)
per pof to call pt to r/s-cld & left pt a message to call & r/s no show appt

## 2015-01-14 ENCOUNTER — Ambulatory Visit (HOSPITAL_BASED_OUTPATIENT_CLINIC_OR_DEPARTMENT_OTHER)
Admission: RE | Admit: 2015-01-14 | Discharge: 2015-01-14 | Disposition: A | Discharge status: Home / Self Care | Payer: Commercial Managed Care - HMO | Source: Ambulatory Visit | Attending: Cardiology | Admitting: Cardiology

## 2015-01-14 ENCOUNTER — Ambulatory Visit (HOSPITAL_COMMUNITY)
Admission: RE | Admit: 2015-01-14 | Discharge: 2015-01-14 | Disposition: A | Discharge status: Home / Self Care | Payer: Commercial Managed Care - HMO | Source: Ambulatory Visit | Attending: Internal Medicine | Admitting: Internal Medicine

## 2015-01-14 ENCOUNTER — Encounter (HOSPITAL_COMMUNITY): Payer: Self-pay

## 2015-01-14 VITALS — BP 100/72 | HR 80 | Wt 209.0 lb

## 2015-01-14 DIAGNOSIS — M797 Fibromyalgia: Secondary | ICD-10-CM | POA: Diagnosis not present

## 2015-01-14 DIAGNOSIS — N61 Inflammatory disorders of breast: Secondary | ICD-10-CM

## 2015-01-14 DIAGNOSIS — Z79899 Other long term (current) drug therapy: Secondary | ICD-10-CM | POA: Insufficient documentation

## 2015-01-14 DIAGNOSIS — E785 Hyperlipidemia, unspecified: Secondary | ICD-10-CM | POA: Insufficient documentation

## 2015-01-14 DIAGNOSIS — I429 Cardiomyopathy, unspecified: Secondary | ICD-10-CM | POA: Insufficient documentation

## 2015-01-14 DIAGNOSIS — Z794 Long term (current) use of insulin: Secondary | ICD-10-CM | POA: Insufficient documentation

## 2015-01-14 DIAGNOSIS — I5022 Chronic systolic (congestive) heart failure: Secondary | ICD-10-CM | POA: Diagnosis not present

## 2015-01-14 DIAGNOSIS — Z6836 Body mass index (BMI) 36.0-36.9, adult: Secondary | ICD-10-CM | POA: Insufficient documentation

## 2015-01-14 DIAGNOSIS — G4733 Obstructive sleep apnea (adult) (pediatric): Secondary | ICD-10-CM | POA: Diagnosis not present

## 2015-01-14 DIAGNOSIS — I10 Essential (primary) hypertension: Secondary | ICD-10-CM | POA: Diagnosis not present

## 2015-01-14 DIAGNOSIS — E119 Type 2 diabetes mellitus without complications: Secondary | ICD-10-CM | POA: Insufficient documentation

## 2015-01-14 DIAGNOSIS — Z9884 Bariatric surgery status: Secondary | ICD-10-CM | POA: Diagnosis not present

## 2015-01-14 DIAGNOSIS — E669 Obesity, unspecified: Secondary | ICD-10-CM | POA: Diagnosis not present

## 2015-01-14 DIAGNOSIS — N611 Abscess of the breast and nipple: Secondary | ICD-10-CM

## 2015-01-14 LAB — BASIC METABOLIC PANEL
Anion gap: 8 (ref 5–15)
BUN: 14 mg/dL (ref 6–20)
CALCIUM: 9.1 mg/dL (ref 8.9–10.3)
CHLORIDE: 109 mmol/L (ref 101–111)
CO2: 24 mmol/L (ref 22–32)
Creatinine, Ser: 1.09 mg/dL — ABNORMAL HIGH (ref 0.44–1.00)
GFR calc non Af Amer: >60 mL/min (ref >60)
Glucose, Bld: 74 mg/dL (ref 65–99)
POTASSIUM: 3.7 mmol/L (ref 3.5–5.1)
Sodium: 141 mmol/L (ref 135–145)

## 2015-01-14 LAB — BRAIN NATRIURETIC PEPTIDE: B NATRIURETIC PEPTIDE 5: 8.3 pg/mL (ref 0.0–100.0)

## 2015-01-14 MED ORDER — TORSEMIDE 20 MG PO TABS: 40.0000 mg | ORAL_TABLET | Freq: Every day | ORAL | Status: DC

## 2015-01-14 NOTE — Progress Notes (Signed)
Patient ID: [name], female   DOB: January 01, 1971, 44 y.o.   MRN: [id number] PCP: Dr. Nathanial Rancher Referring MD: Dr. Delton See  44 yo with history of nonischemic cardiomyopathy . Cardiomyopathy was diagnosed back in 5/15.  Echo at that time showed EF 20-25%. Echo 1/16 EF 55-60%  Her sister, of note, had a heart transplantation.  She was taken for RHC/LHC showed preserved cardiac output an no angiographic CAD.  CPX showed a mild to moderate functional limitation for her age.    She is doing well today.  She had a sleep study in 6/16 with minimal OSA.  Weight is down 11 lbs with diet.  No significant exertional dyspnea.  No orthopnea/PND.  On Saturday, she had prolonged sharp pain under her left breast. There was no trigger and it resolved after several hours without returning. Echo was done today and showed maintenance of improved EF, 55% today.   Labs (10/15): BNP 52, ferritin normal Labs (11/15): K 4.3, creatinine 1.2, SPEP negative, TSH normal Labs (06/05/14): K 4.5 Creatinine 1.6 metolazone was stopped.  Labs (08/24/14): K 4.0, creatinine 1.1, TSH normal, LDL 104  PMH: 1. Nonischemic cardiomyopathy: Echo (5/15) with EF 20-25%, diffuse hypokinesis, grade II diastolic dysfunction, normal RV size and systolic function.  LHC/RHC (6/15) with no angiographic CAD; mean RA 11, PA 41/16 (mean 27), mean PCWP 15, CI 3.36, PVR 1.8.  CPX (8/15) with RER 1.14, peak VO2 13.7 (63% predicted), peak VO2 20.2 when adjusted for ideal body weight, VE/VCO2 slope 31 => mild to moderate functional limitation.  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 No history of ETOH or drug abuse.  SPEP and TSH negative, ferritin normal.  Echo 07/10/2014 with EF 55-60% Grade I DD.  Echo (8/16) with EF 55%, normal RV size and systolic function.  2. Type II diabetes 3. HTN 4. Hyperlipidemia 5. Low back pain 6. Menorrhagia: s/p uterine ablation.  7. Bipolar disorder 8. Fibromyalgia 9. Migraines 10. Obesity: s/p gastric bypass.  11. Sleep  study 6/16 with minimal OSA.   SH: Lives with mother in [place].  No smoking, ETOH, or drugs.   FH: Sister had heart transplant.  Uncle and grandfather with CHF.   ROS: All systems reviewed and negative except as per HPI.   Current Outpatient Prescriptions  Medication Sig Dispense Refill  . albuterol (PROVENTIL HFA;VENTOLIN HFA) 108 (90 BASE) MCG/ACT inhaler Inhale 2 puffs into the lungs every 6 (six) hours as needed for wheezing.     Marland Kitchen ALPRAZolam (XANAX) 0.25 MG tablet Take 1 tablet (0.25 mg total) by mouth 2 (two) times daily as needed for anxiety. 60 tablet 0  . alum & mag hydroxide-simeth (MAALOX PLUS) 400-400-40 MG/5ML suspension Take by mouth every 6 (six) hours as needed for indigestion.    . Biotin 5000 MCG TABS Take 5,000 mcg by mouth daily.    Marland Kitchen buPROPion (WELLBUTRIN XL) 300 MG 24 hr tablet Take 300 mg by mouth daily before breakfast.     . carvedilol (COREG) 6.25 MG tablet Take 1 tablet (6.25 mg total) by mouth 2 (two) times daily with a meal. 60 tablet 3  . citalopram (CELEXA) 40 MG tablet Take 40 mg by mouth daily before breakfast.     . cyclobenzaprine (FLEXERIL) 10 MG tablet Take 10 mg by mouth 3 (three) times daily as needed for muscle spasms.     . DULoxetine (CYMBALTA) 30 MG capsule Take 1 capsule (30 mg total) by mouth daily. 30 capsule 1  . ENTRESTO 97-103 MG TAKE 1  TABLET BY MOUTH 2 (TWO) TIMES DAILY. 60 tablet 3  . fluticasone (FLONASE) 50 MCG/ACT nasal spray Place 1 spray into both nostrils daily.     Marland Kitchen ibuprofen (ADVIL,MOTRIN) 800 MG tablet Take 800 mg by mouth every 8 (eight) hours as needed (pain).     . Insulin Glargine (LANTUS SOLOSTAR) 100 UNIT/ML Solostar Pen Inject 80 Units into the skin daily.     . insulin glulisine (APIDRA) 100 UNIT/ML injection Inject 5 Units into the skin 3 (three) times daily before meals.    . ivabradine (CORLANOR) 5 MG TABS tablet Take 1 tablet (5 mg total) by mouth 2 (two) times daily with a meal. 90 tablet 3  . lidocaine (LIDODERM) 5  % Place 1 patch onto the skin daily as needed (for back pain).     . lubiprostone (AMITIZA) 24 MCG capsule Take 24 mcg by mouth 2 (two) times daily as needed for constipation.    Marland Kitchen oxyCODONE-acetaminophen (PERCOCET) 10-325 MG per tablet Take 1 tablet by mouth every 4 (four) hours as needed for pain. (Patient taking differently: Take 0.5-1 tablets by mouth every 4 (four) hours as needed for pain. ) 30 tablet 0  . potassium chloride SA (KLOR-CON M20) 20 MEQ tablet Take 1 tablet (20 mEq total) by mouth daily. 90 tablet 6  . pregabalin (LYRICA) 150 MG capsule Take 150 mg by mouth 3 (three) times daily.    . ranitidine (ZANTAC) 150 MG tablet Take 150 mg by mouth daily.    Marland Kitchen spironolactone (ALDACTONE) 25 MG tablet Take 1 tablet (25 mg total) by mouth daily. 90 tablet 6  . torsemide (DEMADEX) 20 MG tablet Take 2 tablets (40 mg total) by mouth daily. 60 tablet 6   No current facility-administered medications for this encounter.   BP 100/72 mmHg  Pulse 80  Wt 209 lb (94.802 kg)  SpO2 97% General: NAD Neck: Thick, JVP not elevated.  Goiter noted.  Lungs: Clear to auscultation bilaterally with normal respiratory effort. CV: Lateral PMI. Heart regular S1/S2, no S3/S4, no murmur.  No edema.  No carotid bruit.  Normal pedal pulses.  Abdomen: Soft, nontender, no hepatosplenomegaly, mild distention.  Skin: Intact without lesions or rashes.  Neurologic: Alert and oriented x 3.  Psych: Normal affect. Extremities: No clubbing or cyanosis. No edema.   HEENT: Normal.   Assessment/Plan: 1. Chronic systolic CHF: Nonischemic cardiomyopathy.  Possibly familial (sister had heart transplant).  NYHA II symptoms.  Volume status stable without volume overload.  CPX showed mild to moderate functional limitation in 8/15.  Echo in 1/16 showed recovery of EF to 55-60%.  Echo done today showed EF 55%, normal RV size and systolic function (maintaining improvement).  - Continue current Coreg, Entresto, ivabradine, and  spironolactone.  - I think that she can back off on the torsemide. I will decrease her dose to 40 mg daily.  - BMET/BNP today.  Given spironolactone use, will arrange for BMET in 3 months.  2. Obesity: Minimal OSA on 6/16 sleep study.   Jozalynn Noyce,MD 01/14/2015

## 2015-01-14 NOTE — Patient Instructions (Signed)
Routine lab work today. Will notify you of abnormal results, otherwise no news is good news!  DECREASE Torsemide to 40mg  (2 tabs) once daily.  Return in 3 months for lab work.  Follow up 6 months.  Do the following things EVERYDAY: 1) Weigh yourself in the morning before breakfast. Write it down and keep it in a log. 2) Take your medicines as prescribed 3) Eat low salt foods-Limit salt (sodium) to 2000 mg per day.  4) Stay as active as you can everyday 5) Limit all fluids for the day to less than 2 liters

## 2015-01-14 NOTE — Progress Notes (Signed)
  Echocardiogram 2D Echocardiogram has been performed.  Leta Jungling M 01/14/2015, 9:52 AM

## 2015-01-15 ENCOUNTER — Other Ambulatory Visit: Payer: Self-pay

## 2015-01-15 NOTE — Patient Outreach (Signed)
[facility]) Care Management  [facility] Care Manager  01/15/2015   [name] Apr 06, 1971 [id number]  Subjective: Patient reports she has made progress in self-management of her diabetes, has lost weight, and got a good report from her cardiologist.  She also reports that she has started walking daily.  States she is committed to taking better care of herself, and appreciates everything her team of caregivers is doing to assist her.  Current Medications:  Current Outpatient Prescriptions  Medication Sig Dispense Refill  . albuterol (PROVENTIL HFA;VENTOLIN HFA) 108 (90 BASE) MCG/ACT inhaler Inhale 2 puffs into the lungs every 6 (six) hours as needed for wheezing.     Marland Kitchen ALPRAZolam (XANAX) 0.25 MG tablet Take 1 tablet (0.25 mg total) by mouth 2 (two) times daily as needed for anxiety. 60 tablet 0  . alum & mag hydroxide-simeth (MAALOX PLUS) 400-400-40 MG/5ML suspension Take by mouth every 6 (six) hours as needed for indigestion.    . Biotin 5000 MCG TABS Take 5,000 mcg by mouth daily.    Marland Kitchen buPROPion (WELLBUTRIN XL) 300 MG 24 hr tablet Take 300 mg by mouth daily before breakfast.     . carvedilol (COREG) 6.25 MG tablet Take 1 tablet (6.25 mg total) by mouth 2 (two) times daily with a meal. 60 tablet 3  . citalopram (CELEXA) 40 MG tablet Take 40 mg by mouth daily before breakfast.     . cyclobenzaprine (FLEXERIL) 10 MG tablet Take 10 mg by mouth 3 (three) times daily as needed for muscle spasms.     . DULoxetine (CYMBALTA) 30 MG capsule Take 1 capsule (30 mg total) by mouth daily. 30 capsule 1  . ENTRESTO 97-103 MG TAKE 1 TABLET BY MOUTH 2 (TWO) TIMES DAILY. 60 tablet 3  . fluticasone (FLONASE) 50 MCG/ACT nasal spray Place 1 spray into both nostrils daily.     Marland Kitchen ibuprofen (ADVIL,MOTRIN) 800 MG tablet Take 800 mg by mouth every 8 (eight) hours as needed (pain).     . Insulin Glargine (LANTUS SOLOSTAR) 100 UNIT/ML Solostar Pen Inject 80 Units into the skin daily.     . insulin  glulisine (APIDRA) 100 UNIT/ML injection Inject 5 Units into the skin 3 (three) times daily before meals.    . ivabradine (CORLANOR) 5 MG TABS tablet Take 1 tablet (5 mg total) by mouth 2 (two) times daily with a meal. 90 tablet 3  . lidocaine (LIDODERM) 5 % Place 1 patch onto the skin daily as needed (for back pain).     . lubiprostone (AMITIZA) 24 MCG capsule Take 24 mcg by mouth 2 (two) times daily as needed for constipation.    Marland Kitchen oxyCODONE-acetaminophen (PERCOCET) 10-325 MG per tablet Take 1 tablet by mouth every 4 (four) hours as needed for pain. (Patient taking differently: Take 0.5-1 tablets by mouth every 4 (four) hours as needed for pain. ) 30 tablet 0  . potassium chloride SA (KLOR-CON M20) 20 MEQ tablet Take 1 tablet (20 mEq total) by mouth daily. 90 tablet 6  . pregabalin (LYRICA) 150 MG capsule Take 150 mg by mouth 3 (three) times daily.    . ranitidine (ZANTAC) 150 MG tablet Take 150 mg by mouth daily.    Marland Kitchen spironolactone (ALDACTONE) 25 MG tablet Take 1 tablet (25 mg total) by mouth daily. 90 tablet 6  . torsemide (DEMADEX) 20 MG tablet Take 2 tablets (40 mg total) by mouth daily. 60 tablet 6   No current facility-administered medications for this visit.  Functional Status:  In your present state of health, do you have any difficulty performing the following activities: 01/15/2015 12/18/2014  Hearing? N N  Vision? N N  Difficulty concentrating or making decisions? N N  Walking or climbing stairs? N N  Dressing or bathing? N N  Doing errands, shopping? N N  Preparing Food and eating ? N N  Using the Toilet? N N  In the past six months, have you accidently leaked urine? N N  Do you have problems with loss of bowel control? N N  Managing your Medications? N N  Managing your Finances? N N  Housekeeping or managing your Housekeeping? N N    Fall/Depression Screening: PHQ 2/9 Scores 01/15/2015 12/18/2014 10/22/2014 09/25/2014 09/12/2014 12/27/2013  PHQ - 2 Score 0 0 2 2 - 0  PHQ- 9  Score - - 4 7 - -  Exception Documentation - - Other- indicate reason in comment box (No Data) Patient refusal -    Assessment: Per patient report, her A1C has improved from 9.7 in May to 7.7 now.  She states her blood sugars have stabilized and that the highest reading in the past few weeks was 101.  Blood sugar yesterday was 87.  Plan: Teaching concerning dietary guidelines, taking medications as ordered, and taking and recording daily blood sugars was reinforced by RN Health Coach.  Validation of her progress was provided.  She requested we continue monthly phone calls for reinforcement and support.  Appointment scheduled for follow-up on February 12, 2015.  [name], RN, MSN [facility] Health Coach [phone] Fax [phone]

## 2015-01-16 ENCOUNTER — Telehealth: Payer: Self-pay | Admitting: Hematology

## 2015-01-16 ENCOUNTER — Encounter: Payer: Commercial Managed Care - HMO | Admitting: Hematology

## 2015-01-16 ENCOUNTER — Other Ambulatory Visit: Payer: Commercial Managed Care - HMO

## 2015-01-16 NOTE — Progress Notes (Signed)
This encounter was created in error - please disregard.

## 2015-01-16 NOTE — Telephone Encounter (Signed)
pt cld to r/s appt-stated not feeling well-gave r/s appt time & date

## 2015-01-22 ENCOUNTER — Telehealth: Payer: Self-pay | Admitting: Hematology

## 2015-01-22 ENCOUNTER — Other Ambulatory Visit (HOSPITAL_BASED_OUTPATIENT_CLINIC_OR_DEPARTMENT_OTHER): Payer: Commercial Managed Care - HMO

## 2015-01-22 ENCOUNTER — Ambulatory Visit (HOSPITAL_BASED_OUTPATIENT_CLINIC_OR_DEPARTMENT_OTHER): Payer: Commercial Managed Care - HMO | Admitting: Hematology

## 2015-01-22 ENCOUNTER — Encounter: Payer: Self-pay | Admitting: Hematology

## 2015-01-22 VITALS — BP 121/86 | HR 70 | Temp 99.0°F | Resp 17 | Ht 65.0 in | Wt 209.8 lb

## 2015-01-22 DIAGNOSIS — D649 Anemia, unspecified: Secondary | ICD-10-CM

## 2015-01-22 DIAGNOSIS — R400 Somnolence: Secondary | ICD-10-CM | POA: Diagnosis not present

## 2015-01-22 DIAGNOSIS — N920 Excessive and frequent menstruation with regular cycle: Secondary | ICD-10-CM | POA: Diagnosis not present

## 2015-01-22 DIAGNOSIS — R5383 Other fatigue: Secondary | ICD-10-CM

## 2015-01-22 DIAGNOSIS — I10 Essential (primary) hypertension: Secondary | ICD-10-CM

## 2015-01-22 DIAGNOSIS — D500 Iron deficiency anemia secondary to blood loss (chronic): Secondary | ICD-10-CM

## 2015-01-22 DIAGNOSIS — Z9884 Bariatric surgery status: Secondary | ICD-10-CM

## 2015-01-22 DIAGNOSIS — I509 Heart failure, unspecified: Secondary | ICD-10-CM

## 2015-01-22 DIAGNOSIS — D509 Iron deficiency anemia, unspecified: Secondary | ICD-10-CM

## 2015-01-22 LAB — IRON AND TIBC CHCC
%SAT: 27 % (ref 21–57)
Iron: 70 ug/dL (ref 41–142)
TIBC: 256 ug/dL (ref 236–444)
UIBC: 186 ug/dL (ref 120–384)

## 2015-01-22 LAB — COMPREHENSIVE METABOLIC PANEL (CC13)
ALT: 61 U/L — ABNORMAL HIGH (ref 0–55)
AST: 56 U/L — AB (ref 5–34)
Albumin: 3.4 g/dL — ABNORMAL LOW (ref 3.5–5.0)
Alkaline Phosphatase: 212 U/L — ABNORMAL HIGH (ref 40–150)
Anion Gap: 8 mEq/L (ref 3–11)
BUN: 13.3 mg/dL (ref 7.0–26.0)
CHLORIDE: 110 meq/L — AB (ref 98–109)
CO2: 24 mEq/L (ref 22–29)
CREATININE: 0.8 mg/dL (ref 0.6–1.1)
Calcium: 9 mg/dL (ref 8.4–10.4)
Glucose: 100 mg/dl (ref 70–140)
POTASSIUM: 3.6 meq/L (ref 3.5–5.1)
Sodium: 142 mEq/L (ref 136–145)
TOTAL PROTEIN: 6.9 g/dL (ref 6.4–8.3)
Total Bilirubin: 0.39 mg/dL (ref 0.20–1.20)

## 2015-01-22 LAB — CBC & DIFF AND RETIC
BASO%: 0.5 % (ref 0.0–2.0)
BASOS ABS: 0 10*3/uL (ref 0.0–0.1)
EOS%: 4.4 % (ref 0.0–7.0)
Eosinophils Absolute: 0.3 10*3/uL (ref 0.0–0.5)
HEMATOCRIT: 37.7 % (ref 34.8–46.6)
HEMOGLOBIN: 12.6 g/dL (ref 11.6–15.9)
IMMATURE RETIC FRACT: 7.4 % (ref 1.60–10.00)
LYMPH#: 1.3 10*3/uL (ref 0.9–3.3)
LYMPH%: 20.8 % (ref 14.0–49.7)
MCH: 30.4 pg (ref 25.1–34.0)
MCHC: 33.4 g/dL (ref 31.5–36.0)
MCV: 90.8 fL (ref 79.5–101.0)
MONO#: 0.4 10*3/uL (ref 0.1–0.9)
MONO%: 6.7 % (ref 0.0–14.0)
NEUT%: 67.6 % (ref 38.4–76.8)
NEUTROS ABS: 4.2 10*3/uL (ref 1.5–6.5)
Platelets: 279 10*3/uL (ref 145–400)
RBC: 4.15 10*6/uL (ref 3.70–5.45)
RDW: 13.1 % (ref 11.2–14.5)
Retic %: 1.87 % (ref 0.70–2.10)
Retic Ct Abs: 77.61 10*3/uL (ref 33.70–90.70)
WBC: 6.1 10*3/uL (ref 3.9–10.3)

## 2015-01-22 LAB — FERRITIN CHCC: Ferritin: 278 ng/ml — ABNORMAL HIGH (ref 9–269)

## 2015-01-22 NOTE — Progress Notes (Signed)
[facility] HEMATOLOGY OFFICE PROGRESS NOTE Date of visit: 01/22/2015   Linda Ravel, MD Dr. Sharman Crate [place] [address] [place] [address]  DIAGNOSIS: Iron deficiency anemia  CHIEF COMPLAIN: follow up   CURRENT TREATMENT:  Feraheme 1,020 mg given  On 10/12/13 and 03/20/2014,  08/29/14  INTERVAL HISTORY:  [name] 44 y.o. female with a history of IDA secondary to heavy menses and also a history of gastric bypass is here for follow up. She had endometrial ablation and normal menstrual period since 2015. She did received feraheme 5 month ago and felt much better afterwards. She started feeling fatigued more lately, she has chronic fatigue and body aches from fibromyalgia, which is stable. She denies any other new symptoms. She diabetic, has been trying to lose some weight, and her blood sugars being better controlled lately. She does not take oral iron pill.  MEDICAL HISTORY: Past Medical History  Diagnosis Date  . Asthma   . GERD (gastroesophageal reflux disease)   . Diabetes mellitus   . Hyperlipidemia   . Migraines   . Hypertension   . Back pain, chronic   . Depression   . Neuropathy   . Anxiety   . Nervous breakdown sept 2013  . Fibromyalgia   . History of gastric bypass Jan 2013  . Insomnia   . Wound of left breast   . Nonischemic cardiomyopathy   . Systolic CHF, acute on chronic     CARDIOLOGIST-- DR Aris Lot NELSON  ([place] [place])  . Shortness of breath     with exertion   . Bipolar disorder   . Arthritis     neck and shoulders   . Anemia     hx of iron transfusions - last one 10/2013   . Peripheral vascular disease   . Carpal tunnel syndrome on both sides     INTERIM HISTORY: has Morbid obesity; IDDM (insulin dependent diabetes mellitus); Degenerative disc disease; Nausea and vomiting; Major depressive disorder, recurrent episode, moderate; Compulsive behavior disorder; History of gastric bypass; Anxiety disorder; Nervous  breakdown; Hematochezia; Anemia; DM type 2 (diabetes mellitus, type 2); Generalized weakness; Generalized abdominal pain; Hyperactive bowel sounds; Dehydration; Insomnia; Diabetic neuropathy; Migraine, unspecified, without mention of intractable migraine without mention of status migrainosus; History of iron deficiency anemia; Chronic systolic heart failure; Left breast abscess; Breast injury; Open breast wound; Chronic systolic CHF (congestive heart failure); Goiter; Fatigue; Excessive daytime sleepiness; and Snoring on her problem list.    ALLERGIES:  is allergic to imitrex.  MEDICATIONS: has a current medication list which includes the following prescription(s): albuterol, alum & mag hydroxide-simeth, biotin, bupropion, carvedilol, cyclobenzaprine, duloxetine, entresto, fluticasone, ibuprofen, insulin glargine, insulin glulisine, ivabradine, lidocaine, lubiprostone, oxycodone-acetaminophen, potassium chloride sa, pregabalin, ranitidine, spironolactone, and torsemide.  SURGICAL HISTORY:  Past Surgical History  Procedure Laterality Date  . Back surgery  November 1992  . Cesarean section  December 1998  . Tubal ligation  January 1999  . Cholecystectomy  1998  . Gastric roux-en-y  06/30/2011    Procedure: LAPAROSCOPIC ROUX-EN-Y GASTRIC;  Surgeon: Mariella Saa, MD;  Location: [facility];  Service: General;  Laterality: N/A;     . Esophagogastroduodenoscopy  07/27/2011    Procedure: ESOPHAGOGASTRODUODENOSCOPY (EGD);  Surgeon: Freddy Jaksch, MD;  Location: [facility];  Service: Endoscopy;  Laterality: N/A;  . Irrigation and debridement abscess Left 11/26/2013    Procedure: IRRIGATION AND DEBRIDEMENT ABSCESS;  Surgeon: Velora Heckler, MD;  Location: [facility];  Service: General;  Laterality:  Left;  . Cardiac catheterization  11-14-2013  DR [facility]    NORMAL LV FILLING PRESSURE W/ MILD ELEVATED RV FILLING, POSSIBLY SUFFESTING RV FAILURE OUT OF PROPORTION TO LV FAILURE. EF ABOUT 35% WITH DIFFUSE  HYPOKINESIS. NO ANGIOGRAPHIC CAD, NONISCHEMIC CARDIOMYOPATHY  . Incision and drainage of wound Left 12/14/2013    Procedure: IRRIGATION AND DEBRIDEMENT OF LEFT BREAST WOUND WITH PLACEMENT OF A-CELL, WITH CLOSURE ;  Surgeon: Wayland Denis, DO;  Location: [facility];  Service: Plastics;  Laterality: Left;  . Breast surgery Left     I&D for abcsess   . Spinal cord stimulator insertion N/A 03/09/2014    Procedure: LUMBAR SPINAL CORD STIMULATOR INSERTION;  Surgeon: Gwynne Edinger, MD;  Location: [facility];  Service: Neurosurgery;  Laterality: N/A;  . Dilitation & currettage/hystroscopy with novasure ablation N/A 02/26/2014    Procedure: DILATATION & CURETTAGE/HYSTEROSCOPY WITH NOVASURE ABLATION;  Surgeon: Meriel Pica, MD;  Location: [facility];  Service: Gynecology;  Laterality: N/A;  . Left and right heart catheterization with coronary angiogram N/A 11/14/2013    Procedure: LEFT AND RIGHT HEART CATHETERIZATION WITH CORONARY ANGIOGRAM;  Surgeon: Laurey Morale, MD;  Location: [facility] CATH LAB;  Service: Cardiovascular;  Laterality: N/A;    REVIEW OF SYSTEMS:   Constitutional: Denies fevers, chills or abnormal weight loss; Positive fatigue Eyes: Denies blurriness of vision Ears, nose, mouth, throat, and face: Denies mucositis or sore throat Respiratory: Denies cough, dyspnea or wheezes Cardiovascular: Denies palpitation, chest discomfort or lower extremity swelling Gastrointestinal:  Denies nausea, heartburn or change in bowel habits Skin: Denies abnormal skin rashes Lymphatics: Denies new lymphadenopathy or easy bruising Neurological:Denies numbness, tingling or new weaknesses Behavioral/Psych: Mood is stable, no new changes  All other systems were reviewed with the patient and are negative.  PHYSICAL EXAMINATION: ECOG PERFORMANCE STATUS: 0  Blood pressure 121/86, pulse 70, temperature 99 F (37.2 C), temperature source Oral, resp. rate 17, height 5\' 5"  (1.651 m), weight 209 lb 12.8 oz (95.165 kg),  SpO2 100 %.  GENERAL:alert, no distress and comfortable; moderately obese SKIN: skin color, texture, turgor are normal, no rashes or significant lesions; multiple scarred lesion on lower extremity.  EYES: normal, Conjunctiva are pink and non-injected, sclera clear OROPHARYNX:no exudate, no erythema and lips, buccal mucosa, and tongue normal  NECK: supple, thyroid normal size, non-tender, without nodularity LYMPH:  no palpable lymphadenopathy in the cervical, axillary or supraclavicular BREASTS: No examined.  LUNGS: clear to auscultation with normal breathing effort, no wheezes or rhonchi HEART: regular rate & rhythm and no murmurs and no lower extremity edema ABDOMEN:abdomen soft, non-tender and normal bowel sounds Musculoskeletal:no cyanosis of digits and no clubbing  NEURO: alert & oriented x 3 with fluent speech, no focal motor/sensory deficits  Labs:   CBC Latest Ref Rng 01/22/2015 08/24/2014 05/30/2014  WBC 3.9 - 10.3 10e3/uL 6.1 7.4 6.7  Hemoglobin 11.6 - 15.9 g/dL 38.2 50.5 39.7  Hematocrit 34.8 - 46.6 % 37.7 36.6 36.6  Platelets 145 - 400 10e3/uL 279 263 281   CMP Latest Ref Rng 01/22/2015 01/14/2015 09/06/2014  Glucose 70 - 140 mg/dl 673 74 72  BUN 7.0 - 41.9 mg/dL 13.3 14 12   Creatinine 0.6 - 1.1 mg/dL 0.8 37.$[id number]$$[id number]$) 5.46(T  Sodium 136 - 145 mEq/L 142 141 140  Potassium 3.5 - 5.1 mEq/L 3.6 3.7 3.8  Chloride 101 - 111 mmol/L - 109 104  CO2 22 - 29 mEq/L 24 24 27   Calcium 8.4 - 10.4 mg/dL 9.0 9.1 9.2  Total Protein 6.4 - 8.3 g/dL 6.9 - -  Albumin 3.5 - 5.5 g/dL - - -  Total Bilirubin 0.20 - 1.20 mg/dL 8.11 - -  Alkaline Phos 40 - 150 U/L 212(H) - -  AST 5 - 34 U/L 56(H) - -  ALT 0 - 55 U/L 61(H) - -    RADIOGRAPHIC STUDIES: None   ASSESSMENT: [name] 44 y.o. female with a history of Iron deficiency anemia    1.  H.o. severe IDA secondary to gastric bypass and menorrhagia   --Previously, her iron indices demonstrated iron level of 67 and ferritin of 33. She has two  factor contributing to iron deficiency secondary to menorrhagia and/or s/p gastric bypass surgery. GI workup including FOBT was negative. She received IV Feraheme twice and her hemoglobin significantly improved.  -She has had endometrial ablation in August 2015, iron deficient anemia has significantly improved afterwards. -She did not respond well to oral iron, and does not want try it anymore. -Her ferritin and iron level are still pending from today, if a slow, I'll set up her IV Feraheme in the next few weeks. -We'll continue monitoring her blood counts and I'll level every 4 months.  2. Fatigue and drowsiness -Her TSH and sleep study wall normal. -She will continue follow-up with her primary care physician  3. DM2 and CHF  -- Continue management per PCP.      Follow up.  Return to clinic in 4 months with repeat CBC and iron studies one week before I call her with her I'll level tomorrow, IV feraheme if needed.  All questions were answered. The patient knows to call the clinic with any problems, questions or concerns. We can certainly see the patient much sooner if necessary.  I spent 15 minutes counseling the patient face to face. The total time spent in the appointment was 20 minutes.    Malachy Mood  01/22/2015

## 2015-01-22 NOTE — Telephone Encounter (Signed)
Gave adn printed appt sched and avs for pt for DEc °

## 2015-01-24 ENCOUNTER — Telehealth: Payer: Self-pay | Admitting: Hematology

## 2015-01-24 NOTE — Telephone Encounter (Signed)
I called her cell phone and left a message: her ferritin and iron level are good, no need for iv feraheme.  [name]  01/24/2015

## 2015-02-06 ENCOUNTER — Other Ambulatory Visit: Payer: Self-pay | Admitting: Cardiology

## 2015-02-12 ENCOUNTER — Other Ambulatory Visit: Payer: Self-pay

## 2015-02-12 ENCOUNTER — Ambulatory Visit: Payer: Self-pay

## 2015-02-12 NOTE — Patient Outreach (Signed)
[facility]) Care Management  02/12/2015  [name] March 30, 1971 [id number]   Unsuccessful attempt to contact patient by phone for scheduled assessment. Will attempt to contact patient by tomorrow 02-13-15.  [name], RN, MSN RN [employer] [phone] Fax [phone]

## 2015-02-12 NOTE — Patient Outreach (Addendum)
[facility]) Care Management  [facility] Care Manager  02/12/2015   [name] [birth year] [id number]  Subjective: Patient apologized for missing our scheduled contact this morning.  States she was with her brother at the hospital, who was admitted last week with a heart attack.  She reports she is very optimistic about the progress she has made in self-managing her diabetes.  She report her fasting blood sugar has not been over 101 in the past month, and that she has lost down to 211 pounds (weight in June was 218 lbs.)  Objective:   Current Medications:  Current Outpatient Prescriptions  Medication Sig Dispense Refill  . alum & mag hydroxide-simeth (MAALOX PLUS) 400-400-40 MG/5ML suspension Take by mouth every 6 (six) hours as needed for indigestion.    . Biotin 5000 MCG TABS Take 5,000 mcg by mouth daily.    Marland Kitchen buPROPion (WELLBUTRIN XL) 300 MG 24 hr tablet Take 300 mg by mouth daily before breakfast.     . carvedilol (COREG) 6.25 MG tablet Take 1 tablet (6.25 mg total) by mouth 2 (two) times daily with a meal. 60 tablet 3  . cyclobenzaprine (FLEXERIL) 10 MG tablet Take 10 mg by mouth 3 (three) times daily as needed for muscle spasms.     . DULoxetine (CYMBALTA) 60 MG capsule Take 60 mg by mouth daily.  1  . ENTRESTO 97-103 MG TAKE 1 TABLET BY MOUTH 2 (TWO) TIMES DAILY. 60 tablet 3  . fluticasone (FLONASE) 50 MCG/ACT nasal spray Place 1 spray into both nostrils daily.     Marland Kitchen ibuprofen (ADVIL,MOTRIN) 800 MG tablet Take 800 mg by mouth every 8 (eight) hours as needed (pain).     . Insulin Glargine (LANTUS SOLOSTAR) 100 UNIT/ML Solostar Pen Inject 80 Units into the skin daily.     . insulin glulisine (APIDRA) 100 UNIT/ML injection Inject 5 Units into the skin 3 (three) times daily before meals.    . ivabradine (CORLANOR) 5 MG TABS tablet Take 1 tablet (5 mg total) by mouth 2 (two) times daily with a meal. 90 tablet 3  . lidocaine (LIDODERM) 5 % Place 1 patch onto the skin  daily as needed (for back pain).     . lubiprostone (AMITIZA) 24 MCG capsule Take 24 mcg by mouth 2 (two) times daily as needed for constipation.    Marland Kitchen oxyCODONE-acetaminophen (PERCOCET) 10-325 MG per tablet Take 1 tablet by mouth every 4 (four) hours as needed for pain. (Patient taking differently: Take 0.5-1 tablets by mouth every 4 (four) hours as needed for pain. ) 30 tablet 0  . potassium chloride SA (KLOR-CON M20) 20 MEQ tablet Take 1 tablet (20 mEq total) by mouth daily. 90 tablet 6  . pregabalin (LYRICA) 150 MG capsule Take 150 mg by mouth 3 (three) times daily.    . ranitidine (ZANTAC) 150 MG tablet Take 150 mg by mouth daily.    Marland Kitchen spironolactone (ALDACTONE) 25 MG tablet Take 1 tablet (25 mg total) by mouth daily. 90 tablet 6  . torsemide (DEMADEX) 20 MG tablet Take 2 tablets (40 mg total) by mouth daily. 60 tablet 6  . VENTOLIN HFA 108 (90 BASE) MCG/ACT inhaler INHALE 2 PUFFS BY MOUTH EVERY 4 TO 6 HOURS AS NEEDED FOR FOR SHORTNESS OF BREATH OR WHEEZING 8.5 Inhaler 3   No current facility-administered medications for this visit.    Functional Status:  In your present state of health, do you have any difficulty performing the following activities: 01/15/2015  12/18/2014  Hearing? N N  Vision? N N  Difficulty concentrating or making decisions? N N  Walking or climbing stairs? N N  Dressing or bathing? N N  Doing errands, shopping? N N  Preparing Food and eating ? N N  Using the Toilet? N N  In the past six months, have you accidently leaked urine? N N  Do you have problems with loss of bowel control? N N  Managing your Medications? N N  Managing your Finances? N N  Housekeeping or managing your Housekeeping? N N    Fall/Depression Screening: PHQ 2/9 Scores 01/15/2015 12/18/2014 10/22/2014 09/25/2014 09/12/2014 12/27/2013  PHQ - 2 Score 0 0 2 2 - 0  PHQ- 9 Score - - 4 7 - -  Exception Documentation - - Other- indicate reason in comment box (No Data) Patient refusal -      Assessment:  Self-reported improvement in diabetic self-management reflected in stable blood sugar readings and weight loss  Plan: Patient will :continue checking daily fasting blood sugars and keep a record                                continue to make healthy food choices to continue working toward her goal to weigh below                                                    200 lbs.                                continue taking all medications as ordered using med boxes provided by [employer].             RN Health Coach will support and reinforce teaching of diabetic management guidelines.                                Return assessment scheduled for 9- 27-16.   [name], RN, MSN RN [employer] [phone] Fax [phone]

## 2015-02-14 DIAGNOSIS — M5416 Radiculopathy, lumbar region: Secondary | ICD-10-CM | POA: Diagnosis not present

## 2015-02-14 DIAGNOSIS — M5136 Other intervertebral disc degeneration, lumbar region: Secondary | ICD-10-CM | POA: Diagnosis not present

## 2015-02-14 DIAGNOSIS — M546 Pain in thoracic spine: Secondary | ICD-10-CM | POA: Diagnosis not present

## 2015-02-14 DIAGNOSIS — M961 Postlaminectomy syndrome, not elsewhere classified: Secondary | ICD-10-CM | POA: Diagnosis not present

## 2015-02-21 ENCOUNTER — Encounter: Payer: Self-pay | Admitting: Cardiology

## 2015-02-21 DIAGNOSIS — G4733 Obstructive sleep apnea (adult) (pediatric): Secondary | ICD-10-CM

## 2015-02-21 HISTORY — DX: Obstructive sleep apnea (adult) (pediatric): G47.33

## 2015-02-21 NOTE — Progress Notes (Signed)
Cardiology Office Note   Date:  02/22/2015   ID:  [name], DOB 02-May-1971, MRN [id number]  PCP:  [name], MD    Chief Complaint  Patient presents with  . OSA      History of Present Illness: [name] is a 44 y.o. female who presents for today for evaluation of OSA.  She recently told Dr. Gala Romney that she was having snoring, morning HAs and excessive daytime sleepiness.  She says that she doeses off at 9 and then gets back up at 10pm and then goes back to bed around 11:30pm and usually falls asleep 1-2 hours later because she is watching TV.  She gets up around 6am.  She tends to fall asleep during the day and has dosed off when trying.  She underwent PSG showing minimal OSA with an AHI of 5.5 events per hour and oxygen saturations as low as 87%.  The time spent with Oxygen < 88% was 0.1 minutes.  She is now here to discuss her treatment options.      Past Medical History  Diagnosis Date  . Asthma   . GERD (gastroesophageal reflux disease)   . Diabetes mellitus   . Hyperlipidemia   . Migraines   . Hypertension   . Back pain, chronic   . Depression   . Neuropathy   . Anxiety   . Nervous breakdown sept 2013  . Fibromyalgia   . History of gastric bypass Jan 2013  . Insomnia   . Wound of left breast   . Nonischemic cardiomyopathy   . Systolic CHF, acute on chronic     CARDIOLOGIST-- DR Aris Lot NELSON  ([facility])  . Shortness of breath     with exertion   . Bipolar disorder   . Arthritis     neck and shoulders   . Anemia     hx of iron transfusions - last one 10/2013   . Peripheral vascular disease   . Carpal tunnel syndrome on both sides   . OSA (obstructive sleep apnea) 02/21/2015    Past Surgical History  Procedure Laterality Date  . Back surgery  November 1992  . Cesarean section  December 1998  . Tubal ligation  January 1999  . Cholecystectomy  1998  . Gastric roux-en-y  06/30/2011    Procedure: LAPAROSCOPIC  ROUX-EN-Y GASTRIC;  Surgeon: Mariella Saa, MD;  Location: [facility];  Service: General;  Laterality: N/A;     . Esophagogastroduodenoscopy  07/27/2011    Procedure: ESOPHAGOGASTRODUODENOSCOPY (EGD);  Surgeon: Freddy Jaksch, MD;  Location: [facility];  Service: Endoscopy;  Laterality: N/A;  . Irrigation and debridement abscess Left 11/26/2013    Procedure: IRRIGATION AND DEBRIDEMENT ABSCESS;  Surgeon: Velora Heckler, MD;  Location: [facility];  Service: General;  Laterality: Left;  . Cardiac catheterization  11-14-2013  [facility]    NORMAL LV FILLING PRESSURE W/ MILD ELEVATED RV FILLING, POSSIBLY SUFFESTING RV FAILURE OUT OF PROPORTION TO LV FAILURE. EF ABOUT 35% WITH DIFFUSE HYPOKINESIS. NO ANGIOGRAPHIC CAD, NONISCHEMIC CARDIOMYOPATHY  . Incision and drainage of wound Left 12/14/2013    Procedure: IRRIGATION AND DEBRIDEMENT OF LEFT BREAST WOUND WITH PLACEMENT OF A-CELL, WITH CLOSURE ;  Surgeon: Wayland Denis, DO;  Location: [facility];  Service: Plastics;  Laterality: Left;  . Breast surgery Left     I&D for abcsess   . Spinal cord stimulator  insertion N/A 03/09/2014    Procedure: LUMBAR SPINAL CORD STIMULATOR INSERTION;  Surgeon: Gwynne Edinger, MD;  Location: [facility];  Service: Neurosurgery;  Laterality: N/A;  . Dilitation & currettage/hystroscopy with novasure ablation N/A 02/26/2014    Procedure: DILATATION & CURETTAGE/HYSTEROSCOPY WITH NOVASURE ABLATION;  Surgeon: Meriel Pica, MD;  Location: [facility];  Service: Gynecology;  Laterality: N/A;  . Left and right heart catheterization with coronary angiogram N/A 11/14/2013    Procedure: LEFT AND RIGHT HEART CATHETERIZATION WITH CORONARY ANGIOGRAM;  Surgeon: Laurey Morale, MD;  Location: [facility] CATH LAB;  Service: Cardiovascular;  Laterality: N/A;     Current Outpatient Prescriptions  Medication Sig Dispense Refill  . alum & mag hydroxide-simeth (MAALOX PLUS) 400-400-40 MG/5ML suspension Take by mouth every 6 (six) hours as needed for  indigestion.    . Biotin 5000 MCG TABS Take 5,000 mcg by mouth daily.    Marland Kitchen buPROPion (WELLBUTRIN XL) 300 MG 24 hr tablet Take 300 mg by mouth daily before breakfast.     . carvedilol (COREG) 6.25 MG tablet Take 1 tablet (6.25 mg total) by mouth 2 (two) times daily with a meal. 60 tablet 3  . cyclobenzaprine (FLEXERIL) 10 MG tablet Take 10 mg by mouth 3 (three) times daily as needed for muscle spasms.     . diclofenac sodium (VOLTAREN) 1 % GEL Apply 2 g topically 2 (two) times daily.    . DULoxetine (CYMBALTA) 60 MG capsule Take 60 mg by mouth daily.    Marland Kitchen ENTRESTO 97-103 MG TAKE 1 TABLET BY MOUTH 2 (TWO) TIMES DAILY. 60 tablet 3  . fluticasone (FLONASE) 50 MCG/ACT nasal spray Place 1 spray into both nostrils daily.     Marland Kitchen ibuprofen (ADVIL,MOTRIN) 800 MG tablet Take 800 mg by mouth every 8 (eight) hours as needed (pain).     . Insulin Glargine (LANTUS SOLOSTAR) 100 UNIT/ML Solostar Pen Inject 80 Units into the skin daily.     . insulin glulisine (APIDRA) 100 UNIT/ML injection Inject 5 Units into the skin 3 (three) times daily before meals.    . ivabradine (CORLANOR) 5 MG TABS tablet Take 1 tablet (5 mg total) by mouth 2 (two) times daily with a meal. 90 tablet 3  . lidocaine (LIDODERM) 5 % Place 1 patch onto the skin daily as needed (for back pain).     . lubiprostone (AMITIZA) 24 MCG capsule Take 24 mcg by mouth 2 (two) times daily as needed for constipation.    Marland Kitchen oxyCODONE-acetaminophen (PERCOCET) 10-325 MG per tablet Take 0.5-1 tablets by mouth every 4 (four) hours as needed for pain.    . polyethylene glycol (MIRALAX / GLYCOLAX) packet Take 17 g by mouth 3 (three) times a week.    . potassium chloride SA (KLOR-CON M20) 20 MEQ tablet Take 1 tablet (20 mEq total) by mouth daily. 90 tablet 6  . pregabalin (LYRICA) 150 MG capsule Take 150 mg by mouth 3 (three) times daily.    . ranitidine (ZANTAC) 150 MG tablet Take 150 mg by mouth daily.    Marland Kitchen spironolactone (ALDACTONE) 25 MG tablet Take 1 tablet  (25 mg total) by mouth daily. 90 tablet 6  . torsemide (DEMADEX) 20 MG tablet Take 2 tablets (40 mg total) by mouth daily. 60 tablet 6  . VENTOLIN HFA 108 (90 BASE) MCG/ACT inhaler INHALE 2 PUFFS BY MOUTH EVERY 4 TO 6 HOURS AS NEEDED FOR FOR SHORTNESS OF BREATH OR WHEEZING 8.5 Inhaler 3   No current  facility-administered medications for this visit.    Allergies:   Imitrex    Social History:  The patient  reports that she has never smoked. She has never used smokeless tobacco. She reports that she does not drink alcohol or use illicit drugs.   Family History:  The patient's family history includes Diabetes in her maternal aunt, paternal aunt, paternal grandfather, and paternal grandmother; Hypertension in her mother and paternal grandfather. There is no history of Heart attack or Stroke.    ROS:  Please see the history of present illness.   Otherwise, review of systems are positive for none.   All other systems are reviewed and negative.    PHYSICAL EXAM: VS:  BP 130/82 mmHg  Pulse 83  Ht 5\' 5"  (1.651 m)  Wt 207 lb 12.8 oz (94.257 kg)  BMI 34.58 kg/m2  SpO2 98% , BMI Body mass index is 34.58 kg/(m^2). GEN: Well nourished, well developed, in no acute distress HEENT: normal Neck: no JVD, carotid bruits, or masses Cardiac: RRR; no murmurs, rubs, or gallops,no edema  Respiratory:  clear to auscultation bilaterally, normal work of breathing GI: soft, nontender, nondistended, + BS MS: no deformity or atrophy Skin: warm and dry, no rash Neuro:  Strength and sensation are intact Psych: euthymic mood, full affect   EKG:  EKG is not ordered today.    Recent Labs: 05/30/2014: Pro B Natriuretic peptide (BNP) 16.5 09/06/2014: TSH 1.521 01/14/2015: B Natriuretic Peptide 8.3 01/22/2015: ALT 61*; BUN 13.3; Creatinine 0.8; HGB 12.6; Platelets 279; Potassium 3.6; Sodium 142    Lipid Panel    Component Value Date/Time   CHOL 204* 03/20/2011 1302   TRIG 114 03/20/2011 1302   HDL 64  03/20/2011 1302   CHOLHDL 3.2 03/20/2011 1302   VLDL 23 03/20/2011 1302   LDLCALC 104 09/18/2014      Wt Readings from Last 3 Encounters:  02/22/15 207 lb 12.8 oz (94.257 kg)  02/12/15 211 lb (95.709 kg)  01/22/15 209 lb 12.8 oz (95.165 kg)       ASSESSMENT AND PLAN:  1.  Very mild OSA with an AHI of 5.5 events/hr with snoring and excessive daytime sleepiness.  She does not have significant OSA to warrant CPAP therapy.  I will refer her to ENT for evaluation of surgical causes of snoring and very mild OSA.  I will also refer her to dentistry to fit an oral device. 2.  Excessive daytime sleepiness - I suspect that her daytime sleepiness is related mainly to poor sleep hygiene.  SHe is only getting on average, 5 hours of sleep nightly.   I have counseled her extensively in good sleep hygiene.  I have instructed her to try to find something to occupy her time around 9pm each night when she starts to get sleepy and avoid a nap.  Then when she is very sleepy go to bed with not TV.  I encouraged her to try to avoid any TV in bed and make sure the room is dark. I also encouraged her to try to avoid napping during the day.  I will see her back in 3 months and if symptoms are not better, then will get an MSLT study to rule out narcolepsy although she does not have classic symptoms for that.   3.  Snoring   Current medicines are reviewed at length with the patient today.  The patient does not have concerns regarding medicines.  The following changes have been made:  no change me  Labs/ tests ordered today: See above Assessment and Plan No orders of the defined types were placed in this encounter.     Disposition:   FU with me in 3 months  Signed, Quintella Reichert, MD  02/22/2015 8:39 AM    [facility] [address] [place], [place], [place]  [address] Phone: [phone]; Fax: [phone]

## 2015-02-22 ENCOUNTER — Ambulatory Visit (INDEPENDENT_AMBULATORY_CARE_PROVIDER_SITE_OTHER): Payer: Medicare HMO | Admitting: Cardiology

## 2015-02-22 ENCOUNTER — Encounter: Payer: Self-pay | Admitting: Cardiology

## 2015-02-22 VITALS — BP 130/82 | HR 83 | Ht 65.0 in | Wt 207.8 lb

## 2015-02-22 DIAGNOSIS — G4733 Obstructive sleep apnea (adult) (pediatric): Secondary | ICD-10-CM | POA: Diagnosis not present

## 2015-02-22 DIAGNOSIS — R0683 Snoring: Secondary | ICD-10-CM | POA: Diagnosis not present

## 2015-02-22 DIAGNOSIS — G4719 Other hypersomnia: Secondary | ICD-10-CM

## 2015-02-22 NOTE — Patient Instructions (Signed)
Medication Instructions: - none  Labwork: - none  Procedures/Testing: - none  Follow-Up: - You have been referred to : Dr. Lazarus Salines for evaluation of mild sleep apnea/ snoring  - You have been referred to : Dr. Toni Arthurs DDS for evaluation for oral device- Dr. Alveda Reasons office will call you to schedule this appointment  - Your physician recommends that you schedule a follow-up appointment in: 3 months with Dr. Mayford Knife  Any Additional Special Instructions Will Be Listed Below (If Applicable).

## 2015-03-13 ENCOUNTER — Other Ambulatory Visit: Payer: Self-pay

## 2015-03-13 ENCOUNTER — Ambulatory Visit: Payer: Self-pay

## 2015-03-13 NOTE — Patient Outreach (Signed)
[facility]) Care Management  [place]  03/13/2015   [name] 1970-08-30 [id number]  Subjective: Patient reports she has met her goal to decrease her weight below 200 pounds.  States readings have been in the 190s for several weeks.  Also reports checking and recording daily blood sugars, and that the highest reading in the past month was 117.  She does report several hypoglycemic episodes but is able to discuss management plan for same.  She states she is taking her medications as ordered.  Ms. Kohlman reports being worried about her brother, who recently had an MI, and about problems with her son.  Also reports sleep issues- she had an evaluation which showed mild sleep apnea.  She states she has daytime sleepiness and has fallen asleep while driving.   Current Medications:  Current Outpatient Prescriptions  Medication Sig Dispense Refill  . alum & mag hydroxide-simeth (MAALOX PLUS) 400-400-40 MG/5ML suspension Take by mouth every 6 (six) hours as needed for indigestion.    . Biotin 5000 MCG TABS Take 5,000 mcg by mouth daily.    Marland Kitchen buPROPion (WELLBUTRIN XL) 300 MG 24 hr tablet Take 300 mg by mouth daily before breakfast.     . carvedilol (COREG) 6.25 MG tablet Take 1 tablet (6.25 mg total) by mouth 2 (two) times daily with a meal. 60 tablet 3  . cyclobenzaprine (FLEXERIL) 10 MG tablet Take 10 mg by mouth 3 (three) times daily as needed for muscle spasms.     . diclofenac sodium (VOLTAREN) 1 % GEL Apply 2 g topically 2 (two) times daily.    . DULoxetine (CYMBALTA) 60 MG capsule Take 60 mg by mouth daily.    Marland Kitchen ENTRESTO 97-103 MG TAKE 1 TABLET BY MOUTH 2 (TWO) TIMES DAILY. 60 tablet 3  . fluticasone (FLONASE) 50 MCG/ACT nasal spray Place 1 spray into both nostrils daily.     Marland Kitchen ibuprofen (ADVIL,MOTRIN) 800 MG tablet Take 800 mg by mouth every 8 (eight) hours as needed (pain).     . Insulin Glargine (LANTUS SOLOSTAR) 100 UNIT/ML Solostar Pen Inject 80 Units  into the skin daily.     . insulin glulisine (APIDRA) 100 UNIT/ML injection Inject 5 Units into the skin 3 (three) times daily before meals.    . ivabradine (CORLANOR) 5 MG TABS tablet Take 1 tablet (5 mg total) by mouth 2 (two) times daily with a meal. 90 tablet 3  . lidocaine (LIDODERM) 5 % Place 1 patch onto the skin daily as needed (for back pain).     . lubiprostone (AMITIZA) 24 MCG capsule Take 24 mcg by mouth 2 (two) times daily as needed for constipation.    Marland Kitchen oxyCODONE-acetaminophen (PERCOCET) 10-325 MG per tablet Take 0.5-1 tablets by mouth every 4 (four) hours as needed for pain.    . polyethylene glycol (MIRALAX / GLYCOLAX) packet Take 17 g by mouth 3 (three) times a week.    . potassium chloride SA (KLOR-CON M20) 20 MEQ tablet Take 1 tablet (20 mEq total) by mouth daily. 90 tablet 6  . pregabalin (LYRICA) 150 MG capsule Take 150 mg by mouth 3 (three) times daily.    . ranitidine (ZANTAC) 150 MG tablet Take 150 mg by mouth daily.    Marland Kitchen spironolactone (ALDACTONE) 25 MG tablet Take 1 tablet (25 mg total) by mouth daily. 90 tablet 6  . torsemide (DEMADEX) 20 MG tablet Take 2 tablets (40 mg total) by mouth daily. 60 tablet 6  .  VENTOLIN HFA 108 (90 BASE) MCG/ACT inhaler INHALE 2 PUFFS BY MOUTH EVERY 4 TO 6 HOURS AS NEEDED FOR FOR SHORTNESS OF BREATH OR WHEEZING 8.5 Inhaler 3   No current facility-administered medications for this visit.    Functional Status:  In your present state of health, do you have any difficulty performing the following activities: 03/13/2015 02/12/2015  Hearing? N N  Vision? N N  Difficulty concentrating or making decisions? N N  Walking or climbing stairs? N -  Dressing or bathing? N -  Doing errands, shopping? N -  Preparing Food and eating ? - -  Using the Toilet? - -  In the past six months, have you accidently leaked urine? - -  Do you have problems with loss of bowel control? - -  Managing your Medications? - -  Managing your Finances? - -   Housekeeping or managing your Housekeeping? - -    Fall/Depression Screening: PHQ 2/9 Scores 03/13/2015 01/15/2015 12/18/2014 10/22/2014 09/25/2014 09/12/2014 12/27/2013  PHQ - 2 Score 2 0 0 2 2 - 0  PHQ- 9 Score 8 - - 4 7 - -  Exception Documentation - - - Other- indicate reason in comment box (No Data) Patient refusal -    Assessment: Diabetic self-management has improved as evidenced by patient reported improvements in her diet, daily blood sugars, medication compliance, and weight.  PHQ-9 score of 8 today, which she attributes to concerns about her family.  Plan: Positive reinforcement provided for progress she has achieved.  Education on diabetic self-management reviewed.  Education provided on healthy sleep habits, including not watching TV in bed, not consuming caffeine in the evenings, avoiding day-time napping, and avoiding driving when sleepy. Follow-up assessment scheduled for 04-11-15.  Candie Mile, RN, MSN [place] [phone] Fax [phone]

## 2015-04-02 DIAGNOSIS — Z6835 Body mass index (BMI) 35.0-35.9, adult: Secondary | ICD-10-CM | POA: Diagnosis not present

## 2015-04-02 DIAGNOSIS — N390 Urinary tract infection, site not specified: Secondary | ICD-10-CM | POA: Diagnosis not present

## 2015-04-11 ENCOUNTER — Other Ambulatory Visit: Payer: Self-pay

## 2015-04-11 ENCOUNTER — Ambulatory Visit: Payer: Self-pay

## 2015-04-11 NOTE — Patient Outreach (Signed)
[name]) Care Management  [place]  04/11/2015   [name] November 26, 1970 [id number]  Telephonic assessment completed.  Patient reports she is checking daily blood sugars, and that the highest recent fasting level was 118.  She does report 2 episodes of hypoglycemia, and is able to discuss how to manage those.  She reports her weight at 193 pounds, which remains within her goal of decreasing to below 200 pounds.   Her chief concern today was pain management.  She reports generalized fibromyalgia pain, as well as neuropathic pain in her lower legs and feet.  Pain rating was 7.  She plans to discuss pain management with Dr. Lisbeth Ply at a Nov 17th appointment.   Current Medications:  Current Outpatient Prescriptions  Medication Sig Dispense Refill  . alum & mag hydroxide-simeth (MAALOX PLUS) 400-400-40 MG/5ML suspension Take by mouth every 6 (six) hours as needed for indigestion.    . Biotin 5000 MCG TABS Take 5,000 mcg by mouth daily.    Marland Kitchen buPROPion (WELLBUTRIN XL) 300 MG 24 hr tablet Take 300 mg by mouth daily before breakfast.     . carvedilol (COREG) 6.25 MG tablet Take 1 tablet (6.25 mg total) by mouth 2 (two) times daily with a meal. 60 tablet 3  . cyclobenzaprine (FLEXERIL) 10 MG tablet Take 10 mg by mouth 3 (three) times daily as needed for muscle spasms.     . diclofenac sodium (VOLTAREN) 1 % GEL Apply 2 g topically 2 (two) times daily.    . DULoxetine (CYMBALTA) 60 MG capsule Take 60 mg by mouth daily.    Marland Kitchen ENTRESTO 97-103 MG TAKE 1 TABLET BY MOUTH 2 (TWO) TIMES DAILY. 60 tablet 3  . fluticasone (FLONASE) 50 MCG/ACT nasal spray Place 1 spray into both nostrils daily.     Marland Kitchen ibuprofen (ADVIL,MOTRIN) 800 MG tablet Take 800 mg by mouth every 8 (eight) hours as needed (pain).     . Insulin Glargine (LANTUS SOLOSTAR) 100 UNIT/ML Solostar Pen Inject 80 Units into the skin daily.     . insulin glulisine (APIDRA) 100 UNIT/ML injection Inject 5 Units into the  skin 3 (three) times daily before meals.    . ivabradine (CORLANOR) 5 MG TABS tablet Take 1 tablet (5 mg total) by mouth 2 (two) times daily with a meal. 90 tablet 3  . lidocaine (LIDODERM) 5 % Place 1 patch onto the skin daily as needed (for back pain).     . lubiprostone (AMITIZA) 24 MCG capsule Take 24 mcg by mouth 2 (two) times daily as needed for constipation.    Marland Kitchen oxyCODONE-acetaminophen (PERCOCET) 10-325 MG per tablet Take 0.5-1 tablets by mouth every 4 (four) hours as needed for pain.    . polyethylene glycol (MIRALAX / GLYCOLAX) packet Take 17 g by mouth 3 (three) times a week.    . potassium chloride SA (KLOR-CON M20) 20 MEQ tablet Take 1 tablet (20 mEq total) by mouth daily. 90 tablet 6  . pregabalin (LYRICA) 150 MG capsule Take 150 mg by mouth 3 (three) times daily.    . ranitidine (ZANTAC) 150 MG tablet Take 150 mg by mouth daily.    Marland Kitchen spironolactone (ALDACTONE) 25 MG tablet Take 1 tablet (25 mg total) by mouth daily. 90 tablet 6  . torsemide (DEMADEX) 20 MG tablet Take 2 tablets (40 mg total) by mouth daily. 60 tablet 6  . VENTOLIN HFA 108 (90 BASE) MCG/ACT inhaler INHALE 2 PUFFS BY MOUTH EVERY 4 TO 6  HOURS AS NEEDED FOR FOR SHORTNESS OF BREATH OR WHEEZING 8.5 Inhaler 3   No current facility-administered medications for this visit.    Functional Status:  In your present state of health, do you have any difficulty performing the following activities: 04/11/2015 03/13/2015  Hearing? N N  Vision? N N  Difficulty concentrating or making decisions? N N  Walking or climbing stairs? N N  Dressing or bathing? N N  Doing errands, shopping? N N  Preparing Food and eating ? - -  Using the Toilet? - -  In the past six months, have you accidently leaked urine? - -  Do you have problems with loss of bowel control? - -  Managing your Medications? - -  Managing your Finances? - -  Housekeeping or managing your Housekeeping? - -    Fall/Depression Screening: PHQ 2/9 Scores 03/13/2015  01/15/2015 12/18/2014 10/22/2014 09/25/2014 09/12/2014 12/27/2013  PHQ - 2 Score 2 0 0 2 2 - 0  PHQ- 9 Score 8 - - 4 7 - -  Exception Documentation - - - Other- indicate reason in comment box (No Data) Patient refusal -   THN CM Care Plan Problem One        Most Recent Value   Care Plan Problem One  Medication Adherence   Role Documenting the Problem One  Health Coach   Care Plan for Problem One  Not Active   Interventions for Problem One Long Term Goal  -- [Patient reports she is taking meds as directed. ]   THN CM Short Term Goal #1 (0-30 days)  Patient will meet with notary in 2 weeks    THN CM Short Term Goal #1 Met Date  09/25/14    [facility] CM Care Plan Problem Two        Most Recent Value   Care Plan Problem Two  Financial   Role Documenting the Problem Two  Clinical Social Worker   Care Plan for Problem Two  Not Active   THN Long Term Goal (31-90) days  Patient will develop a budget with intent to pay for all medical expenses for 60 days    THN CM Short Term Goal #1 (0-30 days)  Patient will be able to afford food that meets her nutrtional requirements for the next 30 days    THN CM Short Term Goal #1 Met Date   09/25/14   THN CM Short Term Goal #2 (0-30 days)  Patient will apply for assitance with glasses within 30 days   THN CM Short Term Goal #2 Met Date  10/25/14    [facility] CM Care Plan Problem Three        Most Recent Value   Care Plan Problem Three  Lack of adherence to Diabetes self-management guidelines.   Role Documenting the Problem [place] for Problem Three  Active   THN CM Short Term Goal #1 (0-30 days)  Patient will be taking all medications as ordered within 30 days.   THN CM Short Term Goal #1 Start Date  10/22/14   [facility] CM Short Term Goal #1 Met Date  11/20/14   Interventions for Short Term Goal #1  Reports she is taking all meds as ordered. [Reinforced teaching about medication adherence.]   THN CM Short Term Goal #2 (0-30 days)  Within 30 days patient  will be able to discuss 3 guidelines for diabetic diet.   THN CM Short Term Goal #2 Start Date  10/22/14   THN CM Short Term Goal #2 Met Date  01/15/15   Interventions for Short Term Goal #2  -- [Affirmation provided for progress made in managing her diet.]   THN CM Short Term Goal #3 (0-30 days)  Patient goal is to loose 5 pounds in the next 30 days.   THN  CM Short Term Goal #3 Start Date  10/22/14   [facility] CM Short Term Goal #3 Met Date  03/13/15   Interventions for Short Term Goal #3  Patient has lost 3 pounds in the last month.  Encouraged her to continue efforts to monitor her intake.  She has not yet started a food diary- encouraged her to do so, and to buy scales if possible to monitor her weight. [Affirmed success-patient's weight 193 pounds.  ]   THN CM Short Term Goal #4 (0-30 days)  Patient will check her blood sugar daily and keep a record of readings for the next 30 days.   THN CM Short Term Goal #4 Start Date  10/22/14   [facility] CM Short Term Goal #4 Met Date  03/13/15   Interventions for Short Term Goal #4  -- [States checking daily and keeping a record. ]     Assessment: Patient has met goals to improve her diabetic self-management, but she desires on-going support from [facility] in order to maintain advances she has made.  Plan: Follow-up in one month, and reassess need for ongoing services.  Candie Mile, RN, MSN [place] [phone] Fax [phone]

## 2015-04-17 ENCOUNTER — Ambulatory Visit (HOSPITAL_COMMUNITY)
Admission: RE | Admit: 2015-04-17 | Discharge: 2015-04-17 | Disposition: A | Discharge status: Home / Self Care | Payer: Commercial Managed Care - HMO | Source: Ambulatory Visit | Attending: Cardiology | Admitting: Cardiology

## 2015-04-17 DIAGNOSIS — I5022 Chronic systolic (congestive) heart failure: Secondary | ICD-10-CM | POA: Insufficient documentation

## 2015-04-17 LAB — BASIC METABOLIC PANEL
ANION GAP: 10 (ref 5–15)
BUN: 13 mg/dL (ref 6–20)
CALCIUM: 8.8 mg/dL — AB (ref 8.9–10.3)
CO2: 19 mmol/L — ABNORMAL LOW (ref 22–32)
Chloride: 113 mmol/L — ABNORMAL HIGH (ref 101–111)
Creatinine, Ser: 0.82 mg/dL (ref 0.44–1.00)
Glucose, Bld: 66 mg/dL (ref 65–99)
Potassium: 4.4 mmol/L (ref 3.5–5.1)
Sodium: 142 mmol/L (ref 135–145)

## 2015-04-20 ENCOUNTER — Other Ambulatory Visit (HOSPITAL_COMMUNITY): Payer: Self-pay | Admitting: Internal Medicine

## 2015-05-10 DIAGNOSIS — I10 Essential (primary) hypertension: Secondary | ICD-10-CM | POA: Diagnosis not present

## 2015-05-10 DIAGNOSIS — E119 Type 2 diabetes mellitus without complications: Secondary | ICD-10-CM | POA: Diagnosis not present

## 2015-05-10 DIAGNOSIS — M549 Dorsalgia, unspecified: Secondary | ICD-10-CM | POA: Diagnosis not present

## 2015-05-10 DIAGNOSIS — E669 Obesity, unspecified: Secondary | ICD-10-CM | POA: Diagnosis not present

## 2015-05-10 DIAGNOSIS — E118 Type 2 diabetes mellitus with unspecified complications: Secondary | ICD-10-CM | POA: Diagnosis not present

## 2015-05-10 DIAGNOSIS — R7989 Other specified abnormal findings of blood chemistry: Secondary | ICD-10-CM | POA: Diagnosis not present

## 2015-05-10 DIAGNOSIS — E559 Vitamin D deficiency, unspecified: Secondary | ICD-10-CM | POA: Diagnosis not present

## 2015-05-10 DIAGNOSIS — E785 Hyperlipidemia, unspecified: Secondary | ICD-10-CM | POA: Diagnosis not present

## 2015-05-14 ENCOUNTER — Other Ambulatory Visit: Payer: Medicare HMO

## 2015-05-14 NOTE — Patient Outreach (Signed)
[facility]) Care Management  05/14/2015  [name] 1971/02/19 [id number]   Telephone call for scheduled contact.  No answer- message left requesting call back. RN Health Coach will attempt another contact within one week.  [name], RN, MSN RN [employer] [phone] Fax [phone]

## 2015-05-21 ENCOUNTER — Other Ambulatory Visit: Payer: Commercial Managed Care - HMO

## 2015-05-22 ENCOUNTER — Other Ambulatory Visit: Payer: Medicare HMO

## 2015-05-22 NOTE — Patient Outreach (Signed)
[facility]) Care Management  05/22/2015  TAISA COWELL 1970-06-30 [id number]  Unsuccessful attempt to reach patient.  Message left requesting call back.  [name], RN, MSN RN [employer] [phone] Fax [phone]

## 2015-05-26 NOTE — Progress Notes (Signed)
This encounter was created in error - please disregard.

## 2015-05-27 ENCOUNTER — Encounter: Payer: Commercial Managed Care - HMO | Admitting: Cardiology

## 2015-05-27 DIAGNOSIS — R0989 Other specified symptoms and signs involving the circulatory and respiratory systems: Secondary | ICD-10-CM

## 2015-05-28 ENCOUNTER — Encounter: Payer: Commercial Managed Care - HMO | Admitting: Hematology

## 2015-05-28 ENCOUNTER — Telehealth: Payer: Self-pay | Admitting: Hematology

## 2015-05-28 ENCOUNTER — Encounter: Payer: Self-pay | Admitting: Hematology

## 2015-05-28 NOTE — Progress Notes (Signed)
No show  This encounter was created in error - please disregard.

## 2015-05-28 NOTE — Telephone Encounter (Signed)
per pof call pt & r/s appt-gave pt r/s time & date

## 2015-05-29 ENCOUNTER — Other Ambulatory Visit: Payer: Self-pay

## 2015-05-29 NOTE — Patient Outreach (Signed)
[facility]) Care Management  05/29/2015  [name] 24-Dec-1970 [id number]   Unsuccessful attempt to reach patient.  Message left requesting call back.  If no call back, I will attempt to call patient within 5 working days.  [name], RN, MSN RN [employer] [phone] Fax [phone]

## 2015-05-30 ENCOUNTER — Encounter: Payer: Self-pay | Admitting: Cardiology

## 2015-05-30 ENCOUNTER — Other Ambulatory Visit: Payer: Self-pay

## 2015-05-30 DIAGNOSIS — E1110 Type 2 diabetes mellitus with ketoacidosis without coma: Secondary | ICD-10-CM

## 2015-05-30 DIAGNOSIS — Z794 Long term (current) use of insulin: Principal | ICD-10-CM

## 2015-05-30 NOTE — Patient Outreach (Signed)
[place] [facility]) Care Management  [name]  05/30/2015   [name] 09/01/1970 [id number]  Received call back from patient after having left a message for her.  Aviannah reports she is doing very well with her blood sugars, with a fasting reading of 98 this am.  She is also pleased that her weight has continued to go down.  Current weight is reported to be 192 pounds.  Patient also states that a recent Hgb A1C was 7.3, which is down from 9.7 in May.  She continues to use her medication boxes provided by [facility], and states that this has helped her take all of her medications as ordered.  Patient discussed feeling "down" with Christmas approaching.  Reports this is the time of year that her father died.  A referral for counselling was offered but declined.  Patient reports that her PCP recommended she take Vitamin D supplements, but that she cannot afford them.  Encouraged patient to check with [facility] about coverage of OTC meds and supplies.  She reports she will call the 800 number on her insurance card.  Current Medications:  Current Outpatient Prescriptions  Medication Sig Dispense Refill  . alum & mag hydroxide-simeth (MAALOX PLUS) 400-400-40 MG/5ML suspension Take by mouth every 6 (six) hours as needed for indigestion.    . Biotin 5000 MCG TABS Take 5,000 mcg by mouth daily.    Marland Kitchen buPROPion (WELLBUTRIN XL) 300 MG 24 hr tablet Take 300 mg by mouth daily before breakfast.     . carvedilol (COREG) 6.25 MG tablet Take 1 tablet (6.25 mg total) by mouth 2 (two) times daily with a meal. 60 tablet 3  . cyclobenzaprine (FLEXERIL) 10 MG tablet Take 10 mg by mouth 3 (three) times daily as needed for muscle spasms.     . diclofenac sodium (VOLTAREN) 1 % GEL Apply 2 g topically 2 (two) times daily.    . DULoxetine (CYMBALTA) 60 MG capsule Take 60 mg by mouth daily.    Marland Kitchen ENTRESTO 97-103 MG TAKE 1 TABLET BY MOUTH 2 (TWO) TIMES DAILY. 60 tablet 3  . fluticasone (FLONASE) 50  MCG/ACT nasal spray Place 1 spray into both nostrils daily.     Marland Kitchen ibuprofen (ADVIL,MOTRIN) 800 MG tablet Take 800 mg by mouth every 8 (eight) hours as needed (pain).     . Insulin Glargine (LANTUS SOLOSTAR) 100 UNIT/ML Solostar Pen Inject 80 Units into the skin daily.     . insulin glulisine (APIDRA) 100 UNIT/ML injection Inject 5 Units into the skin 3 (three) times daily before meals.    . ivabradine (CORLANOR) 5 MG TABS tablet Take 1 tablet (5 mg total) by mouth 2 (two) times daily with a meal. 90 tablet 3  . lidocaine (LIDODERM) 5 % Place 1 patch onto the skin daily as needed (for back pain).     . lubiprostone (AMITIZA) 24 MCG capsule Take 24 mcg by mouth 2 (two) times daily as needed for constipation.    Marland Kitchen oxyCODONE-acetaminophen (PERCOCET) 10-325 MG per tablet Take 0.5-1 tablets by mouth every 4 (four) hours as needed for pain.    . polyethylene glycol (MIRALAX / GLYCOLAX) packet Take 17 g by mouth 3 (three) times a week.    . potassium chloride SA (KLOR-CON M20) 20 MEQ tablet Take 1 tablet (20 mEq total) by mouth daily. 90 tablet 6  . pregabalin (LYRICA) 150 MG capsule Take 150 mg by mouth 3 (three) times daily.    . ranitidine (ZANTAC)  150 MG tablet Take 150 mg by mouth daily.    Marland Kitchen spironolactone (ALDACTONE) 25 MG tablet Take 1 tablet (25 mg total) by mouth daily. 90 tablet 6  . torsemide (DEMADEX) 20 MG tablet Take 2 tablets (40 mg total) by mouth daily. 60 tablet 6  . VENTOLIN HFA 108 (90 BASE) MCG/ACT inhaler INHALE 2 PUFFS BY MOUTH EVERY 4 TO 6 HOURS AS NEEDED FOR FOR SHORTNESS OF BREATH OR WHEEZING 8.5 Inhaler 3   No current facility-administered medications for this visit.   THN CM Care Plan Problem One        Most Recent Value   Care Plan Problem One  Medication Adherence   Role Documenting the Problem One  Health Coach   Care Plan for Problem One  Not Active   Interventions for Problem One Long Term Goal  Continues to take meds as ordered, using med boxes. [Patient reports she  is taking meds as directed. ]   THN CM Short Term Goal #1 (0-30 days)  Patient will meet with notary in 2 weeks    THN CM Short Term Goal #1 Met Date  09/25/14    [facility] CM Care Plan Problem Two        Most Recent Value   Care Plan Problem Two  Financial   Role Documenting the Problem Two  Clinical Social Worker   Care Plan for Problem Two  Not Active   THN Long Term Goal (31-90) days  Patient will develop a budget with intent to pay for all medical expenses for 60 days    THN CM Short Term Goal #1 (0-30 days)  Patient will be able to afford food that meets her nutrtional requirements for the next 30 days    THN CM Short Term Goal #1 Met Date   09/25/14   THN CM Short Term Goal #2 (0-30 days)  Patient will apply for assitance with glasses within 30 days   THN CM Short Term Goal #2 Met Date  10/25/14    [facility] CM Care Plan Problem Three        Most Recent Value   Care Plan Problem Three  Lack of adherence to Diabetes self-management guidelines.   Role Documenting the Problem [place] for Problem Three  Active   THN CM Short Term Goal #1 (0-30 days)  Patient will be taking all medications as ordered within 30 days.   THN CM Short Term Goal #1 Start Date  10/22/14   [facility] CM Short Term Goal #1 Met Date  11/20/14   Interventions for Short Term Goal #1  Reports she is taking all meds as ordered. [Reinforced teaching about medication adherence.]   THN CM Short Term Goal #2 (0-30 days)  Within 30 days patient will be able to discuss 3 guidelines for diabetic diet.   THN CM Short Term Goal #2 Start Date  10/22/14   [facility] CM Short Term Goal #2 Met Date  01/15/15   Interventions for Short Term Goal #2  -- [Affirmation provided for progress made in managing her diet.]   THN CM Short Term Goal #3 (0-30 days)  Patient goal is to loose 5 pounds in the next 30 days.   THN  CM Short Term Goal #3 Start Date  10/22/14   [facility] CM Short Term Goal #3 Met Date  03/13/15   Interventions for Short  Term Goal #3  Patient has lost 3 pounds in the last  month.  Encouraged her to continue efforts to monitor her intake.  She has not yet started a food diary- encouraged her to do so, and to buy scales if possible to monitor her weight. [Affirmed success-patient's weight 193 pounds.  ]   THN CM Short Term Goal #4 (0-30 days)  Patient will check her blood sugar daily and keep a record of readings for the next 30 days.   THN CM Short Term Goal #4 Start Date  10/22/14   [facility] CM Short Term Goal #4 Met Date  03/13/15   Interventions for Short Term Goal #4  Continues to monitor blood sugars.  Reports 98 today, and usually in normal range. [States checking daily and keeping a record. ]       Assessment: Patient's diabetes appears to be well controlled at this time (per patient report).  Plan: Patient will continue to monitor daily blood sugars and abide by diabetic dietary guidelines.           Patient will keep all scheduled medical appointments.           RN Health Coach will follow up in approximately one month.  Candie Mile, RN, MSN [place] [phone] Fax [phone]

## 2015-05-31 DIAGNOSIS — Z6841 Body Mass Index (BMI) 40.0 and over, adult: Secondary | ICD-10-CM | POA: Diagnosis not present

## 2015-05-31 DIAGNOSIS — M5416 Radiculopathy, lumbar region: Secondary | ICD-10-CM | POA: Diagnosis not present

## 2015-05-31 DIAGNOSIS — M546 Pain in thoracic spine: Secondary | ICD-10-CM | POA: Diagnosis not present

## 2015-05-31 DIAGNOSIS — M961 Postlaminectomy syndrome, not elsewhere classified: Secondary | ICD-10-CM | POA: Diagnosis not present

## 2015-05-31 DIAGNOSIS — M5136 Other intervertebral disc degeneration, lumbar region: Secondary | ICD-10-CM | POA: Diagnosis not present

## 2015-05-31 DIAGNOSIS — I10 Essential (primary) hypertension: Secondary | ICD-10-CM | POA: Diagnosis not present

## 2015-06-21 ENCOUNTER — Encounter: Payer: Commercial Managed Care - HMO | Admitting: Hematology

## 2015-06-21 ENCOUNTER — Encounter: Payer: Self-pay | Admitting: Hematology

## 2015-06-21 ENCOUNTER — Other Ambulatory Visit: Payer: Self-pay | Admitting: *Deleted

## 2015-06-21 NOTE — Progress Notes (Signed)
No show  This encounter was created in error - please disregard.

## 2015-06-25 ENCOUNTER — Telehealth: Payer: Self-pay | Admitting: Hematology

## 2015-06-25 NOTE — Telephone Encounter (Signed)
Spoke with patient re lab/fu 1/20. °

## 2015-06-27 ENCOUNTER — Other Ambulatory Visit: Payer: Self-pay

## 2015-06-27 DIAGNOSIS — E1110 Type 2 diabetes mellitus with ketoacidosis without coma: Secondary | ICD-10-CM

## 2015-06-27 DIAGNOSIS — Z794 Long term (current) use of insulin: Principal | ICD-10-CM

## 2015-06-27 NOTE — Patient Outreach (Signed)
[facility]) Care Management  06/27/2015  [name] [birth year] [id number]  Unsuccessful attempt to reach patient for scheduled appointment.  Message left requesting call back.  [name], RN, MSN RN [employer] [phone] Fax [phone]

## 2015-06-27 NOTE — Patient Outreach (Signed)
[facility]) Care Management  [place]  06/27/2015   [name] February 11, 1971 [id number]  Telephonic assessment completed.  Patient reports her blood sugars have been within the normal range for the last month.  States she has had no episodes of hypo or hyperglycemia.  Her weight today is 195, which is up 3 pounds from last month but she reports it ranges from 192 to 195.    Patient's biggest concern today is that her copay for prescriptions has gone up, and she is struggling to refill her medications.    Current Medications:  Current Outpatient Prescriptions  Medication Sig Dispense Refill  . alum & mag hydroxide-simeth (MAALOX PLUS) 400-400-40 MG/5ML suspension Take by mouth every 6 (six) hours as needed for indigestion.    . Biotin 5000 MCG TABS Take 5,000 mcg by mouth daily.    Marland Kitchen buPROPion (WELLBUTRIN XL) 300 MG 24 hr tablet Take 300 mg by mouth daily before breakfast.     . carvedilol (COREG) 6.25 MG tablet Take 1 tablet (6.25 mg total) by mouth 2 (two) times daily with a meal. 60 tablet 3  . cyclobenzaprine (FLEXERIL) 10 MG tablet Take 10 mg by mouth 3 (three) times daily as needed for muscle spasms.     . diclofenac sodium (VOLTAREN) 1 % GEL Apply 2 g topically 2 (two) times daily.    . DULoxetine (CYMBALTA) 60 MG capsule Take 60 mg by mouth daily.    Marland Kitchen ENTRESTO 97-103 MG TAKE 1 TABLET BY MOUTH 2 (TWO) TIMES DAILY. 60 tablet 3  . fluticasone (FLONASE) 50 MCG/ACT nasal spray Place 1 spray into both nostrils daily.     Marland Kitchen ibuprofen (ADVIL,MOTRIN) 800 MG tablet Take 800 mg by mouth every 8 (eight) hours as needed (pain).     . Insulin Glargine (LANTUS SOLOSTAR) 100 UNIT/ML Solostar Pen Inject 80 Units into the skin daily.     . insulin glulisine (APIDRA) 100 UNIT/ML injection Inject 5 Units into the skin 3 (three) times daily before meals.    . ivabradine (CORLANOR) 5 MG TABS tablet Take 1 tablet (5 mg total) by mouth 2 (two) times daily with a meal. 90  tablet 3  . lidocaine (LIDODERM) 5 % Place 1 patch onto the skin daily as needed (for back pain).     . lubiprostone (AMITIZA) 24 MCG capsule Take 24 mcg by mouth 2 (two) times daily as needed for constipation.    Marland Kitchen oxyCODONE-acetaminophen (PERCOCET) 10-325 MG per tablet Take 0.5-1 tablets by mouth every 4 (four) hours as needed for pain.    . polyethylene glycol (MIRALAX / GLYCOLAX) packet Take 17 g by mouth 3 (three) times a week.    . potassium chloride SA (KLOR-CON M20) 20 MEQ tablet Take 1 tablet (20 mEq total) by mouth daily. 90 tablet 6  . pregabalin (LYRICA) 150 MG capsule Take 150 mg by mouth 3 (three) times daily.    . ranitidine (ZANTAC) 150 MG tablet Take 150 mg by mouth daily.    Marland Kitchen spironolactone (ALDACTONE) 25 MG tablet Take 1 tablet (25 mg total) by mouth daily. 90 tablet 6  . torsemide (DEMADEX) 20 MG tablet Take 2 tablets (40 mg total) by mouth daily. 60 tablet 6  . VENTOLIN HFA 108 (90 BASE) MCG/ACT inhaler INHALE 2 PUFFS BY MOUTH EVERY 4 TO 6 HOURS AS NEEDED FOR FOR SHORTNESS OF BREATH OR WHEEZING 8.5 Inhaler 3   No current facility-administered medications for this visit.  Functional Status:  In your present state of health, do you have any difficulty performing the following activities: 06/27/2015 05/30/2015  Hearing? N -  Vision? N -  Difficulty concentrating or making decisions? N N  Walking or climbing stairs? N N  Dressing or bathing? N N  Doing errands, shopping? N N  Preparing Food and eating ? - -  Using the Toilet? - -  In the past six months, have you accidently leaked urine? - -  Do you have problems with loss of bowel control? - -  Managing your Medications? - -  Managing your Finances? - -  Housekeeping or managing your Housekeeping? - -    Fall/Depression Screening: No recent falls reported.   PHQ 2/9 Scores 06/27/2015 06/27/2015 03/13/2015 01/15/2015 12/18/2014 10/22/2014 09/25/2014  PHQ - 2 Score '2 1 2 '$ 0 0 2 2  PHQ- 9 Score '11 7 8 '$ - - 4 7  Exception  Documentation - - - - - Other- indicate reason in comment box (No Data)   THN CM Care Plan Problem One        Most Recent Value   Care Plan Problem One  Medication Adherence   Role Documenting the Problem One  Health Coach   Care Plan for Problem One  Not Active   Interventions for Problem One Long Term Goal  -- [Reports copays have increased, making it hard to fill Rxs.  ]   THN CM Short Term Goal #1 (0-30 days)  Patient will meet with notary in 2 weeks    THN CM Short Term Goal #1 Met Date  09/25/14    [facility] CM Care Plan Problem Two        Most Recent Value   Care Plan Problem Two  Financial   Role Documenting the Problem Two  Clinical Social Worker   Care Plan for Problem Two  Not Active   THN Long Term Goal (31-90) days  Patient will develop a budget with intent to pay for all medical expenses for 60 days    THN CM Short Term Goal #1 (0-30 days)  Patient will be able to afford food that meets her nutrtional requirements for the next 30 days    THN CM Short Term Goal #1 Met Date   09/25/14   THN CM Short Term Goal #2 (0-30 days)  Patient will apply for assitance with glasses within 30 days   THN CM Short Term Goal #2 Met Date  10/25/14    [facility] CM Care Plan Problem Three        Most Recent Value   Care Plan Problem Three  Lack of adherence to Diabetes self-management guidelines.   Role Documenting the Problem [place] for Problem Three  Active   THN CM Short Term Goal #1 (0-30 days)  Patient will be taking all medications as ordered within 30 days.   THN CM Short Term Goal #1 Start Date  10/22/14   [facility] CM Short Term Goal #1 Met Date  11/20/14   Interventions for Short Term Goal #1  Referred to pharmacy for assistance. [Reinforced teaching about medication adherence.]   THN CM Short Term Goal #2 (0-30 days)  Within 30 days patient will be able to discuss 3 guidelines for diabetic diet.   THN CM Short Term Goal #2 Start Date  10/22/14   [facility] CM Short Term Goal #2  Met Date  01/15/15   Interventions for Short Term Goal #  2  -- [Affirmation provided for progress made in managing her diet.]   THN CM Short Term Goal #3 (0-30 days)  Patient goal is to loose 5 pounds in the next 30 days.   THN  CM Short Term Goal #3 Start Date  10/22/14   [facility] CM Short Term Goal #3 Met Date  03/13/15   Interventions for Short Term Goal #3  Weight 195 today, but staying in 192-195 range.  Affirmation provided for her success. [Affirmed success-patient's weight 193 pounds.  ]   THN CM Short Term Goal #4 (0-30 days)  Patient will check her blood sugar daily and keep a record of readings for the next 30 days.   THN CM Short Term Goal #4 Start Date  10/22/14   [facility] CM Short Term Goal #4 Met Date  03/13/15   Interventions for Short Term Goal #4  Reinforced daily monitoring. [States checking daily and keeping a record. ]      Assessment: Patient reports good control of her diabetes, and has kept her weight under 200 pounds.  Plan: Patient will call insurance company [facility]) about the increases in her Rx copays, and also ask about                        coverage for OTC Vitamin D.           Refer to pharmacy for consult about her medications.           RN Health Coach will follow up within one month.  Candie Mile, RN, MSN [place] [phone] Fax [phone]

## 2015-07-05 ENCOUNTER — Other Ambulatory Visit: Payer: Commercial Managed Care - HMO

## 2015-07-10 ENCOUNTER — Telehealth: Payer: Self-pay | Admitting: Hematology

## 2015-07-10 ENCOUNTER — Other Ambulatory Visit: Payer: Self-pay | Admitting: Cardiology

## 2015-07-10 NOTE — Telephone Encounter (Signed)
pt cld left voicemail to r/s appt-cld & spoke to pt and gave pt time & date of r/s appt

## 2015-07-12 ENCOUNTER — Ambulatory Visit: Payer: Commercial Managed Care - HMO | Admitting: Hematology

## 2015-07-16 ENCOUNTER — Ambulatory Visit (HOSPITAL_BASED_OUTPATIENT_CLINIC_OR_DEPARTMENT_OTHER): Payer: Commercial Managed Care - HMO | Admitting: Hematology

## 2015-07-16 ENCOUNTER — Telehealth: Payer: Self-pay | Admitting: Hematology

## 2015-07-16 ENCOUNTER — Ambulatory Visit (HOSPITAL_BASED_OUTPATIENT_CLINIC_OR_DEPARTMENT_OTHER): Payer: Commercial Managed Care - HMO

## 2015-07-16 ENCOUNTER — Encounter: Payer: Self-pay | Admitting: Hematology

## 2015-07-16 DIAGNOSIS — N920 Excessive and frequent menstruation with regular cycle: Secondary | ICD-10-CM | POA: Diagnosis not present

## 2015-07-16 DIAGNOSIS — I509 Heart failure, unspecified: Secondary | ICD-10-CM

## 2015-07-16 DIAGNOSIS — D500 Iron deficiency anemia secondary to blood loss (chronic): Secondary | ICD-10-CM | POA: Diagnosis not present

## 2015-07-16 DIAGNOSIS — E119 Type 2 diabetes mellitus without complications: Secondary | ICD-10-CM

## 2015-07-16 DIAGNOSIS — R400 Somnolence: Secondary | ICD-10-CM | POA: Diagnosis not present

## 2015-07-16 DIAGNOSIS — R5383 Other fatigue: Secondary | ICD-10-CM | POA: Diagnosis not present

## 2015-07-16 DIAGNOSIS — D649 Anemia, unspecified: Secondary | ICD-10-CM

## 2015-07-16 LAB — CBC & DIFF AND RETIC
BASO%: 0.3 % (ref 0.0–2.0)
Basophils Absolute: 0 10*3/uL (ref 0.0–0.1)
EOS ABS: 0.3 10*3/uL (ref 0.0–0.5)
EOS%: 4.8 % (ref 0.0–7.0)
HCT: 35.5 % (ref 34.8–46.6)
HEMOGLOBIN: 12.3 g/dL (ref 11.6–15.9)
IMMATURE RETIC FRACT: 6 % (ref 1.60–10.00)
LYMPH#: 1.8 10*3/uL (ref 0.9–3.3)
LYMPH%: 25.6 % (ref 14.0–49.7)
MCH: 30.3 pg (ref 25.1–34.0)
MCHC: 34.6 g/dL (ref 31.5–36.0)
MCV: 87.4 fL (ref 79.5–101.0)
MONO#: 0.4 10*3/uL (ref 0.1–0.9)
MONO%: 5.7 % (ref 0.0–14.0)
NEUT%: 63.6 % (ref 38.4–76.8)
NEUTROS ABS: 4.5 10*3/uL (ref 1.5–6.5)
Platelets: 280 10*3/uL (ref 145–400)
RBC: 4.06 10*6/uL (ref 3.70–5.45)
RDW: 12.6 % (ref 11.2–14.5)
RETIC %: 1.9 % (ref 0.70–2.10)
RETIC CT ABS: 77.14 10*3/uL (ref 33.70–90.70)
WBC: 7.1 10*3/uL (ref 3.9–10.3)

## 2015-07-16 LAB — COMPREHENSIVE METABOLIC PANEL
ALBUMIN: 3.4 g/dL — AB (ref 3.5–5.0)
ALK PHOS: 261 U/L — AB (ref 40–150)
ALT: 142 U/L — AB (ref 0–55)
AST: 214 U/L — AB (ref 5–34)
Anion Gap: 11 mEq/L (ref 3–11)
BILIRUBIN TOTAL: 0.39 mg/dL (ref 0.20–1.20)
BUN: 19 mg/dL (ref 7.0–26.0)
CO2: 26 meq/L (ref 22–29)
CREATININE: 1 mg/dL (ref 0.6–1.1)
Calcium: 9 mg/dL (ref 8.4–10.4)
Chloride: 104 mEq/L (ref 98–109)
EGFR: 78 mL/min/{1.73_m2} — ABNORMAL LOW (ref >90)
GLUCOSE: 87 mg/dL (ref 70–140)
Potassium: 3.8 mEq/L (ref 3.5–5.1)
SODIUM: 141 meq/L (ref 136–145)
TOTAL PROTEIN: 7.3 g/dL (ref 6.4–8.3)

## 2015-07-16 NOTE — Progress Notes (Addendum)
[facility] HEMATOLOGY OFFICE PROGRESS NOTE Date of visit: 07/16/2015   [name], MD [address] [place] [address]  DIAGNOSIS: Iron deficiency anemia due to chronic blood loss  CHIEF COMPLAIN: follow up   CURRENT TREATMENT:  Feraheme 1,020 mg given  On 10/12/13 and 03/20/2014, 510mg  08/29/14  INTERVAL HISTORY:  [name] 45 y.o. female with a history of IDA secondary to heavy menses and also a history of gastric bypass is here for follow up. She had endometrial ablation and normal menstrual period since 2015. She is doing well overall. She still has chronic back pain and arthritis, which is unchanged. She still has some mild fatigue, able to tolerate routine activities without much difficulty. No other new complaints.  MEDICAL HISTORY: Past Medical History  Diagnosis Date  . Asthma   . GERD (gastroesophageal reflux disease)   . Diabetes mellitus   . Hyperlipidemia   . Migraines   . Hypertension   . Back pain, chronic   . Depression   . Neuropathy (HCC)   . Anxiety   . Nervous breakdown sept 2013  . Fibromyalgia   . History of gastric bypass Jan 2013  . Insomnia   . Wound of left breast   . Nonischemic cardiomyopathy (HCC)   . Systolic CHF, acute on chronic (HCC)     CARDIOLOGIST-- DR Aris Lot NELSON  ([place])  . Shortness of breath     with exertion   . Bipolar disorder (HCC)   . Arthritis     neck and shoulders   . Anemia     hx of iron transfusions - last one 10/2013   . Peripheral vascular disease (HCC)   . Carpal tunnel syndrome on both sides   . OSA (obstructive sleep apnea) 02/21/2015  . Cataract     INTERIM HISTORY: has Morbid obesity (HCC); IDDM (insulin dependent diabetes mellitus) (HCC); Degenerative disc disease; Nausea and vomiting; Major depressive disorder, recurrent episode, moderate (HCC); Compulsive behavior disorder; History of gastric bypass; Anxiety disorder; Nervous breakdown; Hematochezia; Iron deficiency  anemia due to chronic blood loss; DM type 2 (diabetes mellitus, type 2) (HCC); Generalized weakness; Generalized abdominal pain; Hyperactive bowel sounds; Dehydration; Insomnia; Diabetic neuropathy (HCC); Migraine, unspecified, without mention of intractable migraine without mention of status migrainosus; History of iron deficiency anemia; Chronic systolic heart failure (HCC); Left breast abscess; Breast injury; Open breast wound; Chronic systolic CHF (congestive heart failure) (HCC); Goiter; Fatigue; Excessive daytime sleepiness; Snoring; and OSA (obstructive sleep apnea) on her problem list.    ALLERGIES:  is allergic to imitrex.  MEDICATIONS: has a current medication list which includes the following prescription(s): alum & mag hydroxide-simeth, biotin, bupropion, carvedilol, cyclobenzaprine, diclofenac sodium, duloxetine, entresto, fluticasone, ibuprofen, insulin glargine, insulin glulisine, ivabradine, lidocaine, lubiprostone, oxycodone-acetaminophen, polyethylene glycol, potassium chloride sa, pregabalin, ranitidine, spironolactone, torsemide, and ventolin hfa.  SURGICAL HISTORY:  Past Surgical History  Procedure Laterality Date  . Back surgery  November 1992  . Cesarean section  December 1998  . Tubal ligation  January 1999  . Cholecystectomy  1998  . Gastric roux-en-y  06/30/2011    Procedure: LAPAROSCOPIC ROUX-EN-Y GASTRIC;  Surgeon: Mariella Saa, MD;  Location: [facility];  Service: General;  Laterality: N/A;     . Esophagogastroduodenoscopy  07/27/2011    Procedure: ESOPHAGOGASTRODUODENOSCOPY (EGD);  Surgeon: Freddy Jaksch, MD;  Location: [facility];  Service: Endoscopy;  Laterality: N/A;  . Irrigation and debridement abscess Left 11/26/2013    Procedure: IRRIGATION AND DEBRIDEMENT ABSCESS;  Surgeon: Velora Heckler, MD;  Location: [facility];  Service: General;  Laterality: Left;  . Cardiac catheterization  11-14-2013  DR [facility]    NORMAL LV FILLING PRESSURE W/ MILD ELEVATED RV  FILLING, POSSIBLY SUFFESTING RV FAILURE OUT OF PROPORTION TO LV FAILURE. EF ABOUT 35% WITH DIFFUSE HYPOKINESIS. NO ANGIOGRAPHIC CAD, NONISCHEMIC CARDIOMYOPATHY  . Incision and drainage of wound Left 12/14/2013    Procedure: IRRIGATION AND DEBRIDEMENT OF LEFT BREAST WOUND WITH PLACEMENT OF A-CELL, WITH CLOSURE ;  Surgeon: Wayland Denis, DO;  Location: [facility];  Service: Plastics;  Laterality: Left;  . Breast surgery Left     I&D for abcsess   . Spinal cord stimulator insertion N/A 03/09/2014    Procedure: LUMBAR SPINAL CORD STIMULATOR INSERTION;  Surgeon: Gwynne Edinger, MD;  Location: [facility];  Service: Neurosurgery;  Laterality: N/A;  . Dilitation & currettage/hystroscopy with novasure ablation N/A 02/26/2014    Procedure: DILATATION & CURETTAGE/HYSTEROSCOPY WITH NOVASURE ABLATION;  Surgeon: Meriel Pica, MD;  Location: [facility];  Service: Gynecology;  Laterality: N/A;  . Left and right heart catheterization with coronary angiogram N/A 11/14/2013    Procedure: LEFT AND RIGHT HEART CATHETERIZATION WITH CORONARY ANGIOGRAM;  Surgeon: Laurey Morale, MD;  Location: [facility] CATH LAB;  Service: Cardiovascular;  Laterality: N/A;    REVIEW OF SYSTEMS:   Constitutional: Denies fevers, chills or abnormal weight loss; Positive fatigue Eyes: Denies blurriness of vision Ears, nose, mouth, throat, and face: Denies mucositis or sore throat Respiratory: Denies cough, dyspnea or wheezes Cardiovascular: Denies palpitation, chest discomfort or lower extremity swelling Gastrointestinal:  Denies nausea, heartburn or change in bowel habits Skin: Denies abnormal skin rashes Lymphatics: Denies new lymphadenopathy or easy bruising Neurological:Denies numbness, tingling or new weaknesses Behavioral/Psych: Mood is stable, no new changes  All other systems were reviewed with the patient and are negative.  PHYSICAL EXAMINATION: ECOG PERFORMANCE STATUS: 0 Weight 214 pounds, blood pressure 143/68, heart rate 82,  respiratory rate 18, temperature 98.3, pulse ox 100% GENERAL:alert, no distress and comfortable; moderately obese SKIN: skin color, texture, turgor are normal, no rashes or significant lesions; multiple scarred lesion on lower extremity.  EYES: normal, Conjunctiva are pink and non-injected, sclera clear OROPHARYNX:no exudate, no erythema and lips, buccal mucosa, and tongue normal  NECK: supple, thyroid normal size, non-tender, without nodularity LYMPH:  no palpable lymphadenopathy in the cervical, axillary or supraclavicular BREASTS: No examined.  LUNGS: clear to auscultation with normal breathing effort, no wheezes or rhonchi HEART: regular rate & rhythm and no murmurs and no lower extremity edema ABDOMEN:abdomen soft, non-tender and normal bowel sounds Musculoskeletal:no cyanosis of digits and no clubbing  NEURO: alert & oriented x 3 with fluent speech, no focal motor/sensory deficits  Labs:   CBC Latest Ref Rng 07/16/2015 01/22/2015 08/24/2014  WBC 3.9 - 10.3 10e3/uL 7.1 6.1 7.4  Hemoglobin 11.6 - 15.9 g/dL 54.0 98.1 19.1  Hematocrit 34.8 - 46.6 % 35.5 37.7 36.6  Platelets 145 - 400 10e3/uL 280 279 263   CMP Latest Ref Rng 07/16/2015 04/17/2015 01/22/2015  Glucose 70 - 140 mg/dl 87 66 478  BUN 7.0 - 29.5 mg/dL 62.1 13 30.8  Creatinine 0.6 - 1.1 mg/dL 1.0 6.57 0.8  Sodium 846 - 145 mEq/L 141 142 142  Potassium 3.5 - 5.1 mEq/L 3.8 4.4 3.6  Chloride 101 - 111 mmol/L - 113(H) -  CO2 22 - 29 mEq/L 26 19(L) 24  Calcium 8.4 - 10.4 mg/dL 9.0 9.6(E) 9.0  Total Protein  6.4 - 8.3 g/dL 7.3 - 6.9  Total Bilirubin 0.20 - 1.20 mg/dL 7.82 - 9.56  Alkaline Phos 40 - 150 U/L 261(H) - 212(H)  AST 5 - 34 U/L 214(HH) - 56(H)  ALT 0 - 55 U/L 142(H) - 61(H)    RADIOGRAPHIC STUDIES: None   ASSESSMENT: [name] 45 y.o. female with a history of Iron deficiency anemia due to chronic blood loss    1.  H.o. severe IDA secondary to gastric bypass and menorrhagia   --Previously, her iron indices  demonstrated iron level of 67 and ferritin of 33. She has two factor contributing to iron deficiency secondary to menorrhagia and/or s/p gastric bypass surgery. GI workup including FOBT was negative. She received IV Feraheme and her hemoglobin significantly improved.  -She has had endometrial ablation in August 2015, iron deficient anemia has significantly improved afterwards. -She did not respond well to oral iron, and does not want try it anymore. -Her ferritin and iron level last check was normal in August 2016. I'll repeat her lab and today   2. Fatigue and drowsiness -Her TSH and sleep study wall normal. -She will continue follow-up with her primary care physician  3. DM2 and CHF  -- Continue management per PCP.      Follow up.  Labs today Return to clinic in 6 months with repeat CBC and iron studies. If her lab normal on next visit, we'll see her once a year or as need in the future.   All questions were answered. The patient knows to call the clinic with any problems, questions or concerns. We can certainly see the patient much sooner if necessary.  I spent 15 minutes counseling the patient face to face. The total time spent in the appointment was 20 minutes.    Malachy Mood  07/16/2015   Addendum,  Her lab test today showed significantly elevated liver enzymes (see above report), CBC normal. I called pt and left a message about her lab result. She has had elevated liver enzymes for several years, HCV(-), HBsAg (-). I recommend her to follow up with her PCP Dr. Nathanial Rancher, and I will copy my note to Dr. Nathanial Rancher also.   Malachy Mood

## 2015-07-16 NOTE — Telephone Encounter (Signed)
Talked with and scheduled this patient’s appointment(s) while patient was here in our office.       [facility]. °

## 2015-07-17 LAB — IRON AND TIBC
%SAT: 33 % (ref 21–57)
IRON: 88 ug/dL (ref 41–142)
TIBC: 271 ug/dL (ref 236–444)
UIBC: 183 ug/dL (ref 120–384)

## 2015-07-17 LAB — FERRITIN: Ferritin: 539 ng/ml — ABNORMAL HIGH (ref 9–269)

## 2015-07-18 ENCOUNTER — Other Ambulatory Visit: Payer: Self-pay

## 2015-07-18 NOTE — Patient Outreach (Signed)
07/18/2015  Subjective  [name] is a 45 year old female who was referred to me for a medication review.  She is working with Linda Deis, RN, nurse health coach.  She did mention to her that she had noticed an increase in her the cost of her medications.  [name] had advised her to call the insurance company to determine the reason.  She is getting her medications from a local pharmacy which will cost her more than if she received her medications from the mail order pharmacy.    Objective: Outpatient Encounter Prescriptions as of 07/18/2015  Medication Sig Note  . alum & mag hydroxide-simeth (MAALOX PLUS) 400-400-40 MG/5ML suspension Take by mouth every 6 (six) hours as needed for indigestion.   . Biotin 5000 MCG TABS Take 5,000 mcg by mouth daily.   Marland Kitchen buPROPion (WELLBUTRIN XL) 300 MG 24 hr tablet Take 300 mg by mouth daily before breakfast.  05/02/2014: .  . carvedilol (COREG) 6.25 MG tablet Take 1 tablet (6.25 mg total) by mouth 2 (two) times daily with a meal.   . cyclobenzaprine (FLEXERIL) 10 MG tablet Take 10 mg by mouth 3 (three) times daily as needed for muscle spasms.  05/02/2014: .  . diclofenac sodium (VOLTAREN) 1 % GEL Apply 2 g topically 2 (two) times daily.   . DULoxetine (CYMBALTA) 60 MG capsule Take 60 mg by mouth daily.   Marland Kitchen ENTRESTO 97-103 MG TAKE 1 TABLET BY MOUTH 2 (TWO) TIMES DAILY.   . fluticasone (FLONASE) 50 MCG/ACT nasal spray Place 1 spray into both nostrils daily.  05/02/2014: .  . ibuprofen (ADVIL,MOTRIN) 800 MG tablet Take 800 mg by mouth every 8 (eight) hours as needed (pain).    . Insulin Glargine (LANTUS SOLOSTAR) 100 UNIT/ML Solostar Pen Inject 80 Units into the skin daily.    . insulin glulisine (APIDRA) 100 UNIT/ML injection Inject 5 Units into the skin 3 (three) times daily before meals.   . ivabradine (CORLANOR) 5 MG TABS tablet Take 1 tablet (5 mg total) by mouth 2 (two) times daily with a meal.   . lidocaine (LIDODERM) 5 % Place 1 patch onto the skin daily  as needed (for back pain).  05/02/2014: .  . lubiprostone (AMITIZA) 24 MCG capsule Take 24 mcg by mouth 2 (two) times daily as needed for constipation.   Marland Kitchen oxyCODONE-acetaminophen (PERCOCET) 10-325 MG per tablet Take 0.5-1 tablets by mouth every 4 (four) hours as needed for pain.   . polyethylene glycol (MIRALAX / GLYCOLAX) packet Take 17 g by mouth 3 (three) times a week.   . potassium chloride SA (KLOR-CON M20) 20 MEQ tablet Take 1 tablet (20 mEq total) by mouth daily.   . pregabalin (LYRICA) 150 MG capsule Take 150 mg by mouth 3 (three) times daily.   . ranitidine (ZANTAC) 150 MG tablet Take 150 mg by mouth daily. 05/02/2014: .  . spironolactone (ALDACTONE) 25 MG tablet TAKE 1 TABLET BY MOUTH EVERY DAY   . torsemide (DEMADEX) 20 MG tablet Take 2 tablets (40 mg total) by mouth daily.   . VENTOLIN HFA 108 (90 BASE) MCG/ACT inhaler INHALE 2 PUFFS BY MOUTH EVERY 4 TO 6 HOURS AS NEEDED FOR FOR SHORTNESS OF BREATH OR WHEEZING    No facility-administered encounter medications on file as of 07/18/2015.   Assessment  Drugs sorted by system:  Neurologic/Psychologic: bupropion, duloxetine, pregabalin  Cardiovascular: carvedilol, Corlanor, Entresto, spironolactone, torsemide  Pulmonary/Allergy: albuterol, fluticasone NS,   Gastrointestinal: Maalox, amitiza, Miralax  Endocrine: Lantus,  Apidra  Renal: none  Topical: none  Pain: cyclobenzaprine, diclofenac gel, ibuprofen, lidoderm, oxycodone-acetaminophen  Vitamins/Minerals: potassium  Infectious Diseases: none  Miscellaneous: Biotin  Duplications in therapy: no duplications Gaps in therapy: none  Medications to avoid in the elderly: no applicable  Drug interactions: no major drug interactions Other issues noted: none  Plan 1.  No major drug interactions or problems noted.   2.  If [name] continues to have issues affording her medications and lets [name] know, please refer her back to pharmacy.  She may qualify for mail order  pharmacy or she may qualify for Extra Help.  It is too early in the year for patient assistance unless she has spent 3-6% of her income on medications or is in the donut hole.   3.  I will close her out to pharmacy.  I will be happy to assist if future pharmacy issues arise.    Steve Rattler, PharmD, [employer] consultant [phone]

## 2015-07-22 DIAGNOSIS — R7989 Other specified abnormal findings of blood chemistry: Secondary | ICD-10-CM | POA: Diagnosis not present

## 2015-07-22 DIAGNOSIS — E1140 Type 2 diabetes mellitus with diabetic neuropathy, unspecified: Secondary | ICD-10-CM | POA: Diagnosis not present

## 2015-07-22 DIAGNOSIS — Z6835 Body mass index (BMI) 35.0-35.9, adult: Secondary | ICD-10-CM | POA: Diagnosis not present

## 2015-07-23 ENCOUNTER — Other Ambulatory Visit: Payer: Self-pay | Admitting: Family Medicine

## 2015-07-23 DIAGNOSIS — R945 Abnormal results of liver function studies: Secondary | ICD-10-CM

## 2015-07-23 DIAGNOSIS — R7989 Other specified abnormal findings of blood chemistry: Secondary | ICD-10-CM

## 2015-07-25 ENCOUNTER — Other Ambulatory Visit: Payer: Self-pay

## 2015-07-25 DIAGNOSIS — Z794 Long term (current) use of insulin: Principal | ICD-10-CM

## 2015-07-25 DIAGNOSIS — E1110 Type 2 diabetes mellitus with ketoacidosis without coma: Secondary | ICD-10-CM

## 2015-07-25 NOTE — Patient Outreach (Addendum)
[facility]) Care Management  [place]  07/25/2015   [name] July 13, 1970 [id number]  Telephone contact with patient.  She reports her blood sugars remain under control with no hypo or hyperglycemic episodes.  Patient reports fasting cbg this am was 92.She states she saw her PCP this week, and started on Victoza.  Patient continues to struggle with medication copays and other expenses.  A pharmacy consult was done last month.  Advised patient to use her Chief Strategy Officer, but she reports she owes them money and can no longer use them.  Current Medications:  Current Outpatient Prescriptions  Medication Sig Dispense Refill  . alum & mag hydroxide-simeth (MAALOX PLUS) 400-400-40 MG/5ML suspension Take by mouth every 6 (six) hours as needed for indigestion.    . Biotin 5000 MCG TABS Take 5,000 mcg by mouth daily.    Marland Kitchen buPROPion (WELLBUTRIN XL) 300 MG 24 hr tablet Take 300 mg by mouth daily before breakfast.     . carvedilol (COREG) 6.25 MG tablet Take 1 tablet (6.25 mg total) by mouth 2 (two) times daily with a meal. 60 tablet 3  . cyclobenzaprine (FLEXERIL) 10 MG tablet Take 10 mg by mouth 3 (three) times daily as needed for muscle spasms.     . diclofenac sodium (VOLTAREN) 1 % GEL Apply 2 g topically 2 (two) times daily.    . DULoxetine (CYMBALTA) 60 MG capsule Take 60 mg by mouth daily.    Marland Kitchen ENTRESTO 97-103 MG TAKE 1 TABLET BY MOUTH 2 (TWO) TIMES DAILY. 60 tablet 3  . fluticasone (FLONASE) 50 MCG/ACT nasal spray Place 1 spray into both nostrils daily.     Marland Kitchen ibuprofen (ADVIL,MOTRIN) 800 MG tablet Take 800 mg by mouth every 8 (eight) hours as needed (pain).     . Insulin Glargine (LANTUS SOLOSTAR) 100 UNIT/ML Solostar Pen Inject 80 Units into the skin daily.     . insulin glulisine (APIDRA) 100 UNIT/ML injection Inject 5 Units into the skin 3 (three) times daily before meals.    . ivabradine (CORLANOR) 5 MG TABS tablet Take 1 tablet (5 mg total) by  mouth 2 (two) times daily with a meal. 90 tablet 3  . lidocaine (LIDODERM) 5 % Place 1 patch onto the skin daily as needed (for back pain).     . lubiprostone (AMITIZA) 24 MCG capsule Take 24 mcg by mouth 2 (two) times daily as needed for constipation.    Marland Kitchen oxyCODONE-acetaminophen (PERCOCET) 10-325 MG per tablet Take 0.5-1 tablets by mouth every 4 (four) hours as needed for pain.    . polyethylene glycol (MIRALAX / GLYCOLAX) packet Take 17 g by mouth 3 (three) times a week.    . potassium chloride SA (KLOR-CON M20) 20 MEQ tablet Take 1 tablet (20 mEq total) by mouth daily. 90 tablet 6  . pregabalin (LYRICA) 150 MG capsule Take 150 mg by mouth 3 (three) times daily.    . ranitidine (ZANTAC) 150 MG tablet Take 150 mg by mouth daily.    Marland Kitchen spironolactone (ALDACTONE) 25 MG tablet TAKE 1 TABLET BY MOUTH EVERY DAY 90 tablet 0  . torsemide (DEMADEX) 20 MG tablet Take 2 tablets (40 mg total) by mouth daily. 60 tablet 6  . VENTOLIN HFA 108 (90 BASE) MCG/ACT inhaler INHALE 2 PUFFS BY MOUTH EVERY 4 TO 6 HOURS AS NEEDED FOR FOR SHORTNESS OF BREATH OR WHEEZING 8.5 Inhaler 3   No current facility-administered medications for this visit.    [facility]  CM Care Plan Problem One        Most Recent Value   Care Plan Problem One  Medication Adherence   Role Documenting the Problem One  Health Coach   Care Plan for Problem One  Not Active   THN CM Short Term Goal #1 (0-30 days)  Patient will meet with notary in 2 weeks    THN CM Short Term Goal #1 Met Date  09/25/14    [facility] CM Care Plan Problem Two        Most Recent Value   Care Plan Problem Two  Financial   Role Documenting the Problem Two  Clinical Social Worker   Care Plan for Problem Two  Not Active   THN CM Short Term Goal #1 (0-30 days)  Patient will be able to afford food that meets her nutrtional requirements for the next 30 days    THN CM Short Term Goal #1 Met Date   09/25/14   [facility] CM Short Term Goal #2 (0-30 days)  Patient will apply for assitance with  glasses within 30 days   THN CM Short Term Goal #2 Met Date  10/25/14    [facility] CM Care Plan Problem Three        Most Recent Value   Care Plan Problem Three  Lack of adherence to Diabetes self-management guidelines.   Role Documenting the Problem [place] for Problem Three  Active   THN CM Short Term Goal #1 (0-30 days)  Patient will be taking all medications as ordered within 30 days.   THN CM Short Term Goal #1 Met Date  11/20/14   Interventions for Short Term Goal #1  -- [Reports she is struggling to pay for meds, but tries.]   THN CM Short Term Goal #2 (0-30 days)  Within 30 days patient will be able to discuss 3 guidelines for diabetic diet.   THN CM Short Term Goal #2 Met Date  01/15/15   Interventions for Short Term Goal #2  -- [Reinforced healthy food choices and portion control.]   THN CM Short Term Goal #3 (0-30 days)  Patient goal is to loose 5 pounds in the next 30 days.   THN CM Short Term Goal #3 Met Date  03/13/15   Interventions for Short Term Goal #3  -- [Wt today 197.]   THN CM Short Term Goal #4 (0-30 days)  Patient will check her blood sugar daily and keep a record of readings for the next 30 days.   THN CM Short Term Goal #4 Met Date  03/13/15   Interventions for Short Term Goal #4  -- [Self reported cbg of 92 today.]       Assessment: Patient-reported stable diabetic management.  Plan: Patient will continue daily blood sugar monitoring and adherence to diabetic dietary guidelines.           RN will refer to [place] for possible assistance with medication expenses.           RN will follow up in approximately one month.  Candie Mile, RN, MSN [place] [phone] Fax [phone]

## 2015-07-25 NOTE — Patient Outreach (Signed)
[facility]) Care Management  07/25/2015  [name] [birth year] [id number]   Unsuccessful attempt to reach patient for scheduled call.  Her mobile number is now a non-working number.  There was no answer and no way to leave a voice mail on the home number.  RN will attempt to reach patient again with 5 working days.  [name], RN, MSN RN [employer] [phone] Fax [phone]

## 2015-07-30 ENCOUNTER — Other Ambulatory Visit: Payer: Commercial Managed Care - HMO
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
## 2015-08-22 ENCOUNTER — Other Ambulatory Visit: Payer: Self-pay

## 2015-08-22 NOTE — Patient Outreach (Signed)
[facility]) Care Management  08/22/2015  [name] 07/09/1970 [id number]   Conducted scheduled assessment.  Patient reports she is doing well.  States she continues to lose weight, and that her cbg readings are in the 90s.  Her one concern today is that she sleeps poorly, which leads her to be chronically tired.  This is not a new problem.  Her PCP is aware. Suggestions for improving sleep hygiene reviewed with patient, but she is not open to making changes at this time.  [facility] CM Care Plan Problem One        Most Recent Value   Care Plan Problem One  Medication Adherence   Role Documenting the Problem One  Health Coach   Care Plan for Problem One  Not Active   Interventions for Problem One Long Term Goal  -- [Reports copays have increased, making it hard to fill Rxs.  ]   [facility] CM Short Term Goal #1 (0-30 days)  Patient will meet with notary in 2 weeks    [facility] CM Short Term Goal #1 Met Date  09/25/14    [facility] CM Care Plan Problem Two        Most Recent Value   Care Plan Problem Two  Financial   Role Documenting the Problem Two  Clinical Social Worker   Care Plan for Problem Two  Not Active   [facility] Long Term Goal (31-90) days  Patient will develop a budget with intent to pay for all medical expenses for 60 days    [facility] CM Short Term Goal #1 (0-30 days)  Patient will be able to afford food that meets her nutrtional requirements for the next 30 days    [facility] CM Short Term Goal #1 Met Date   09/25/14   [facility] CM Short Term Goal #2 (0-30 days)  Patient will apply for assitance with glasses within 30 days   [facility] CM Short Term Goal #2 Met Date  10/25/14    [facility] CM Care Plan Problem Three        Most Recent Value   Care Plan Problem Three  Lack of adherence to Diabetes self-management guidelines.   Role Documenting the Problem [place] for Problem Three  Active   [facility] CM Short Term Goal #1 (0-30 days)  Patient will be taking all medications as ordered within  30 days.   [facility] CM Short Term Goal #1 Start Date  10/22/14   [facility] CM Short Term Goal #1 Met Date  11/20/14   Interventions for Short Term Goal #1  Referred to pharmacy for assistance. [Reinforced teaching about medication adherence.]   [facility] CM Short Term Goal #2 (0-30 days)  Within 30 days patient will be able to discuss 3 guidelines for diabetic diet.   [facility] CM Short Term Goal #2 Start Date  10/22/14   [facility] CM Short Term Goal #2 Met Date  01/15/15   Interventions for Short Term Goal #2  -- [Affirmation provided for progress made in managing her diet.]   [facility] CM Short Term Goal #3 (0-30 days)  Patient goal is to loose 5 pounds in the next 30 days.   [facility]  CM Short Term Goal #3 Start Date  10/22/14   [facility] CM Short Term Goal #3 Met Date  03/13/15   Interventions for Short Term Goal #3  Patient continues to lose weight.  Is down to 181 pounds. [Affirmed success-patient's weight 193 pounds.  ]  [facility] CM Short Term Goal #4 (0-30 days)  Patient will check her blood sugar daily and keep a record of readings for the next 30 days.   [facility] CM Short Term Goal #4 Start Date  10/22/14   [facility] CM Short Term Goal #4 Met Date  03/13/15   Interventions for Short Term Goal #4  Reports daily cbgs in the 90s. [States checking daily and keeping a record. ]     Affirmation provided to patient in regards to improving her diabetic management and losing weight. RN will follow up in approximately one month.  Candie Mile, RN, MSN [place] [phone] Fax [phone]

## 2015-08-22 NOTE — Addendum Note (Signed)
Addended by: Matthew Saras on: 08/22/2015 03:32 PM   Modules accepted: Medications

## 2015-09-01 ENCOUNTER — Other Ambulatory Visit: Payer: Self-pay | Admitting: Allergy and Immunology

## 2015-09-03 DIAGNOSIS — Z9689 Presence of other specified functional implants: Secondary | ICD-10-CM | POA: Diagnosis not present

## 2015-09-03 DIAGNOSIS — M5416 Radiculopathy, lumbar region: Secondary | ICD-10-CM | POA: Diagnosis not present

## 2015-09-03 DIAGNOSIS — M961 Postlaminectomy syndrome, not elsewhere classified: Secondary | ICD-10-CM | POA: Diagnosis not present

## 2015-09-15 IMAGING — US US BREAST LTD UNI LEFT INC AXILLA
1 series · 14 of 20 positions shown · non-contrast
Comparison: None.

CLINICAL DATA: Left breast abscess.

EXAM:
ULTRASOUND OF THE left BREAST

[Series 1: us breast ltd uni left inc axilla · 0.06mm/px · 20 acquisitions, 14 frames shown]
[im 1/20]
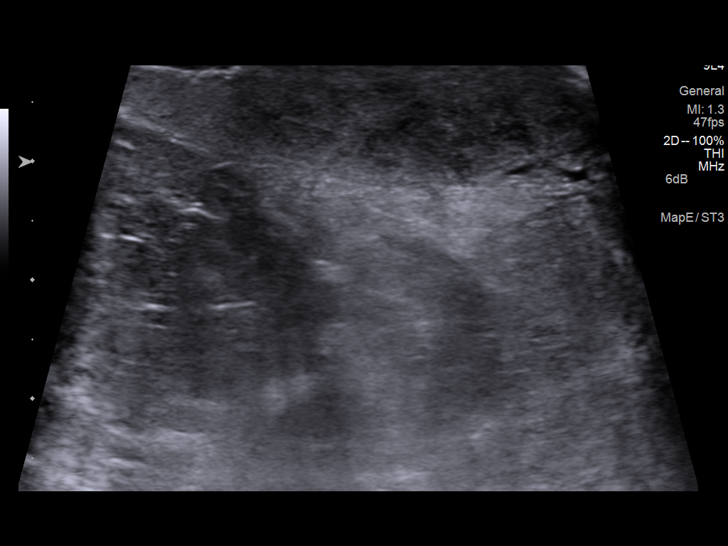
[im 3/20]
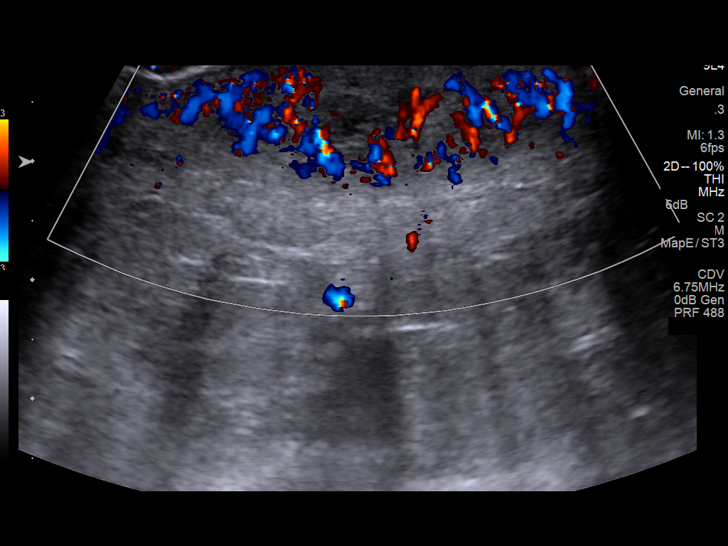
[im 4/20]
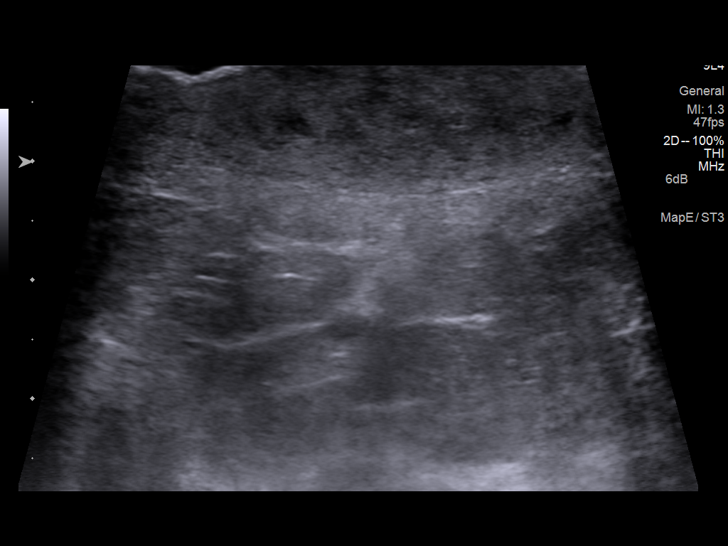
[im 6/20]
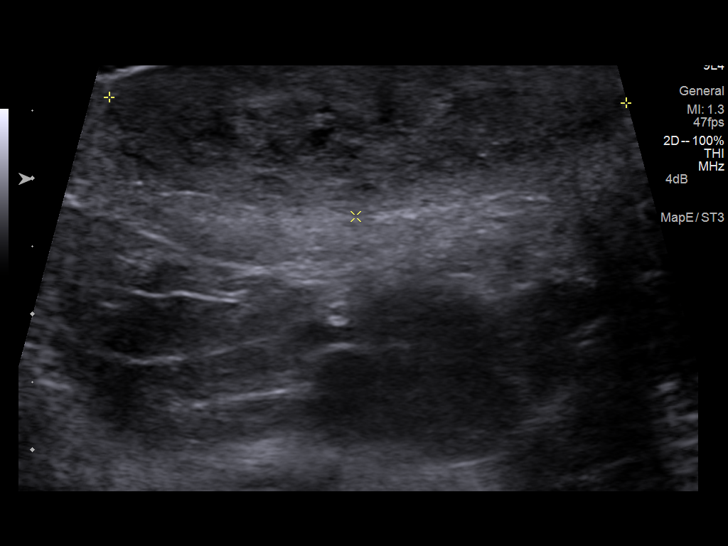
[im 7/20]
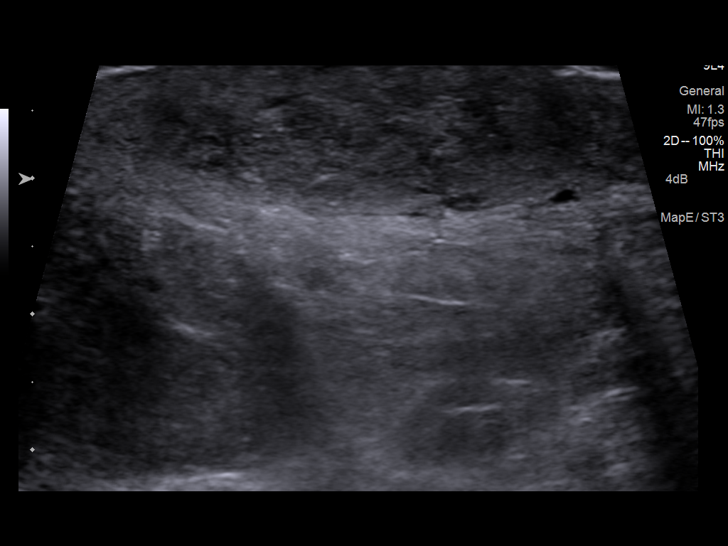
[im 8/20]
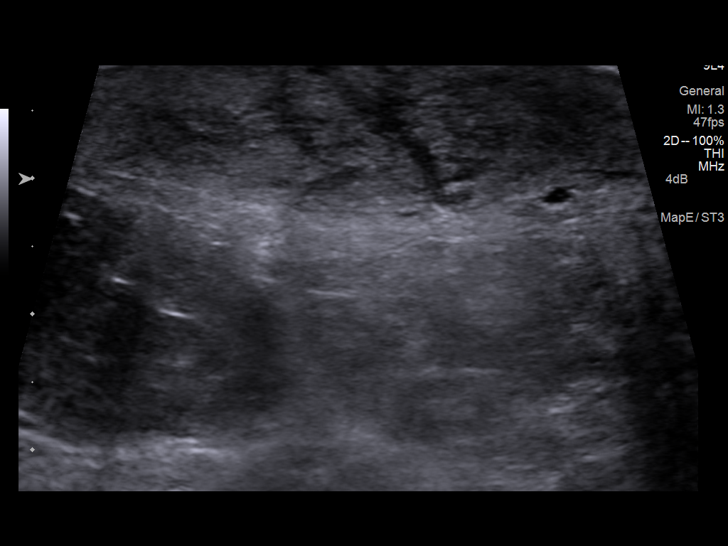
[im 10/20]
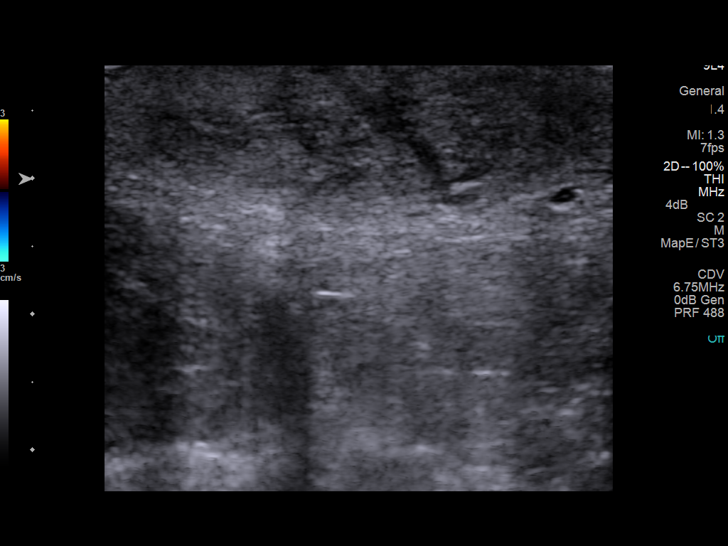
[im 11/20]
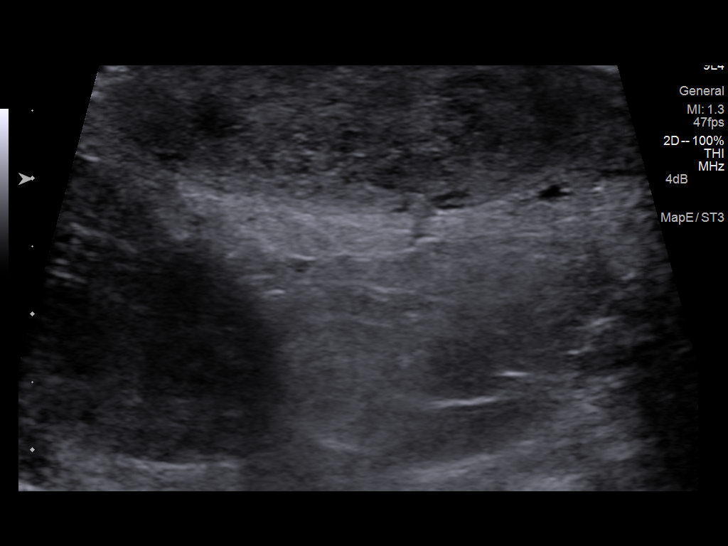
[im 13/20]
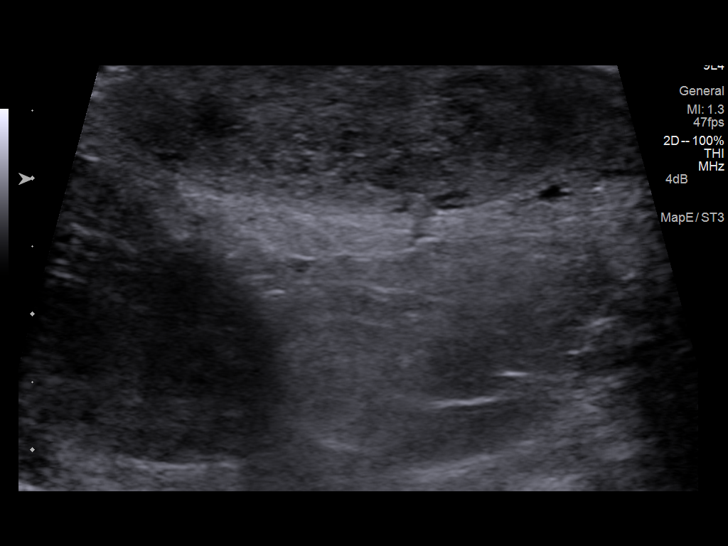
[im 14/20]
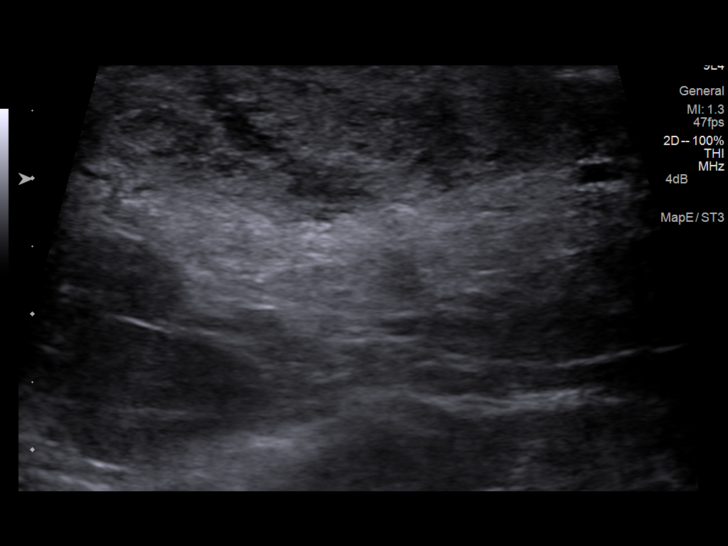
[im 16/20]
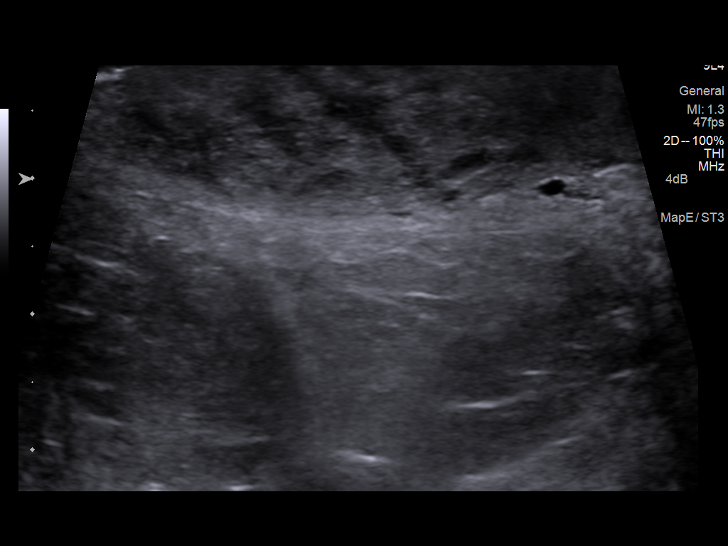
[im 17/20]
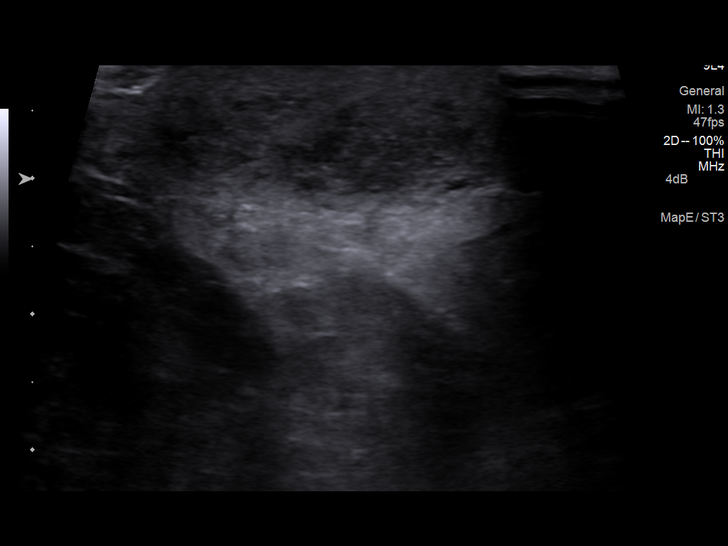
[im 18/20]
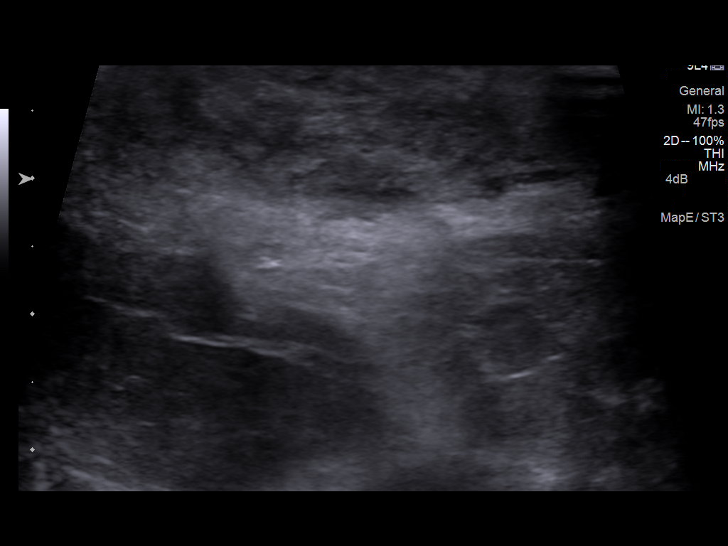
[im 20/20]
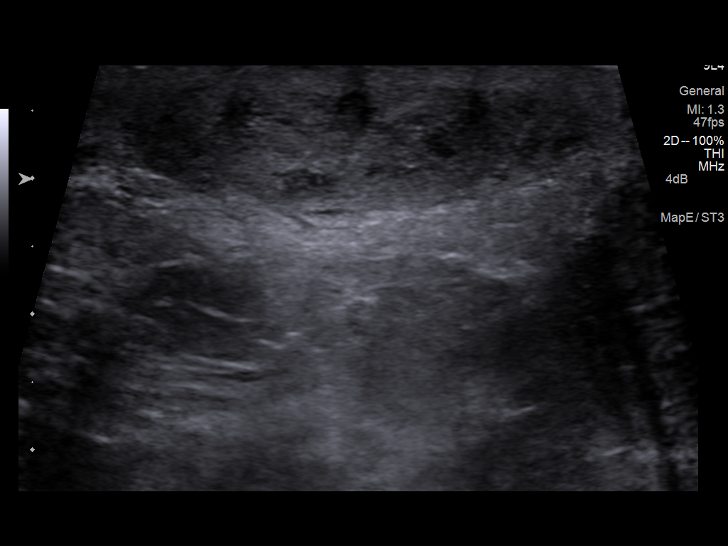

[14 of 20 positions shown; findings below may reference images not displayed]

FINDINGS: Ultrasound is performed, showing no abscess. Mass like area is
present in the left breast at the 5 o'clock position 2 cm from the
nipple. Hypervascularity was present.
IMPRESSION: Negative for abscess. Clinical followup and dedicated breast
ultrasound at the [REDACTED] recommended.

## 2015-09-18 ENCOUNTER — Other Ambulatory Visit: Payer: Self-pay | Admitting: Family Medicine

## 2015-09-18 DIAGNOSIS — E785 Hyperlipidemia, unspecified: Secondary | ICD-10-CM | POA: Diagnosis not present

## 2015-09-18 DIAGNOSIS — Z1231 Encounter for screening mammogram for malignant neoplasm of breast: Secondary | ICD-10-CM

## 2015-09-18 DIAGNOSIS — E559 Vitamin D deficiency, unspecified: Secondary | ICD-10-CM | POA: Diagnosis not present

## 2015-09-18 DIAGNOSIS — R7989 Other specified abnormal findings of blood chemistry: Secondary | ICD-10-CM | POA: Diagnosis not present

## 2015-09-18 DIAGNOSIS — E1140 Type 2 diabetes mellitus with diabetic neuropathy, unspecified: Secondary | ICD-10-CM | POA: Diagnosis not present

## 2015-09-18 DIAGNOSIS — Z6832 Body mass index (BMI) 32.0-32.9, adult: Secondary | ICD-10-CM | POA: Diagnosis not present

## 2015-09-19 ENCOUNTER — Other Ambulatory Visit: Payer: Self-pay

## 2015-09-19 VITALS — Ht 65.0 in | Wt 181.0 lb

## 2015-09-19 DIAGNOSIS — Z794 Long term (current) use of insulin: Principal | ICD-10-CM

## 2015-09-19 DIAGNOSIS — E1110 Type 2 diabetes mellitus with ketoacidosis without coma: Secondary | ICD-10-CM

## 2015-09-19 NOTE — Patient Outreach (Signed)
[place] [facility]) Care Management  09/19/2015  [name] [birth year] [id number]  Scheduled contact completed today. Patient reports she saw her PCP yesterday, and was excited to be told that her A1C is down to 5.8.  Patient reports checking fasting cbgs daily, and that they are in the normal range under 100.  Patient has also succeeded in her weight loss goal- she weighs  181 pounds,down from 214 pounds in July of last year.  She is taking her medications as ordered, and making healthier food choices.  Discussed with patient that she has met the goals we set together.  However, she requests not to be discharged from [facility] at this time.  She states our telephonic contacts help her 'stay on track'.  RN will follow-up in approximately one month. Candie Mile, RN, MSN [place] [phone] Fax [phone]

## 2015-10-07 ENCOUNTER — Ambulatory Visit: Payer: Commercial Managed Care - HMO

## 2015-10-08 ENCOUNTER — Ambulatory Visit
Admission: RE | Admit: 2015-10-08 | Discharge: 2015-10-08 | Disposition: A | Discharge status: Home / Self Care | Payer: Commercial Managed Care - HMO | Source: Ambulatory Visit | Attending: Family Medicine | Admitting: Family Medicine

## 2015-10-08 DIAGNOSIS — Z1231 Encounter for screening mammogram for malignant neoplasm of breast: Secondary | ICD-10-CM | POA: Diagnosis not present

## 2015-10-13 ENCOUNTER — Other Ambulatory Visit (HOSPITAL_COMMUNITY): Payer: Self-pay | Admitting: Internal Medicine

## 2015-10-15 ENCOUNTER — Other Ambulatory Visit: Payer: Self-pay

## 2015-10-15 ENCOUNTER — Other Ambulatory Visit: Payer: Self-pay | Admitting: Cardiology

## 2015-10-15 MED ORDER — SPIRONOLACTONE 25 MG PO TABS: 25.0000 mg | ORAL_TABLET | Freq: Every day | ORAL | Status: DC

## 2015-10-22 ENCOUNTER — Other Ambulatory Visit: Payer: Self-pay

## 2015-10-22 DIAGNOSIS — E1110 Type 2 diabetes mellitus with ketoacidosis without coma: Secondary | ICD-10-CM

## 2015-10-22 DIAGNOSIS — Z794 Long term (current) use of insulin: Principal | ICD-10-CM

## 2015-10-22 NOTE — Patient Outreach (Signed)
[facility]) Care Management  10/22/2015  [name] 30-Mar-1971 [id number]   Telephone contact with Carollee Herter.  She continues to do well.  Reports normal fasting cbgs, and on-going weight loss.  She is taking her meds as ordered.  Patient expresses pride in being able to meet her goals and take better care of herself.  Plan:  Patient will continue self-management of diabetes.           RN will follow up in approximately 6 weeks.  She continues to request staying active with [facility].

## 2015-11-04 DIAGNOSIS — R0789 Other chest pain: Secondary | ICD-10-CM | POA: Diagnosis not present

## 2015-11-04 DIAGNOSIS — Z888 Allergy status to other drugs, medicaments and biological substances status: Secondary | ICD-10-CM | POA: Diagnosis not present

## 2015-11-04 DIAGNOSIS — I10 Essential (primary) hypertension: Secondary | ICD-10-CM | POA: Diagnosis not present

## 2015-11-04 DIAGNOSIS — E119 Type 2 diabetes mellitus without complications: Secondary | ICD-10-CM | POA: Diagnosis not present

## 2015-11-04 DIAGNOSIS — J069 Acute upper respiratory infection, unspecified: Secondary | ICD-10-CM | POA: Diagnosis not present

## 2015-11-04 DIAGNOSIS — M940 Chondrocostal junction syndrome [Tietze]: Secondary | ICD-10-CM | POA: Diagnosis not present

## 2015-11-04 DIAGNOSIS — Z8679 Personal history of other diseases of the circulatory system: Secondary | ICD-10-CM | POA: Diagnosis not present

## 2015-11-04 DIAGNOSIS — F329 Major depressive disorder, single episode, unspecified: Secondary | ICD-10-CM | POA: Diagnosis not present

## 2015-11-04 DIAGNOSIS — R079 Chest pain, unspecified: Secondary | ICD-10-CM | POA: Diagnosis not present

## 2015-11-04 DIAGNOSIS — K219 Gastro-esophageal reflux disease without esophagitis: Secondary | ICD-10-CM | POA: Diagnosis not present

## 2015-11-04 DIAGNOSIS — F419 Anxiety disorder, unspecified: Secondary | ICD-10-CM | POA: Diagnosis not present

## 2015-11-08 DIAGNOSIS — R110 Nausea: Secondary | ICD-10-CM | POA: Diagnosis not present

## 2015-11-08 DIAGNOSIS — R232 Flushing: Secondary | ICD-10-CM | POA: Diagnosis not present

## 2015-11-08 DIAGNOSIS — E669 Obesity, unspecified: Secondary | ICD-10-CM | POA: Diagnosis not present

## 2015-11-08 DIAGNOSIS — Z6831 Body mass index (BMI) 31.0-31.9, adult: Secondary | ICD-10-CM | POA: Diagnosis not present

## 2015-11-27 ENCOUNTER — Inpatient Hospital Stay: Admission: RE | Admit: 2015-11-27 | Payer: Commercial Managed Care - HMO | Source: Ambulatory Visit

## 2015-11-28 ENCOUNTER — Other Ambulatory Visit: Payer: Self-pay

## 2015-11-28 ENCOUNTER — Other Ambulatory Visit (HOSPITAL_COMMUNITY): Payer: Self-pay | Admitting: Pharmacist

## 2015-11-28 DIAGNOSIS — I5022 Chronic systolic (congestive) heart failure: Secondary | ICD-10-CM

## 2015-11-28 DIAGNOSIS — D649 Anemia, unspecified: Secondary | ICD-10-CM

## 2015-11-28 DIAGNOSIS — R531 Weakness: Secondary | ICD-10-CM

## 2015-11-28 MED ORDER — IVABRADINE HCL 5 MG PO TABS: 5.0000 mg | ORAL_TABLET | Freq: Two times a day (BID) | ORAL | Status: DC

## 2015-11-28 NOTE — Patient Outreach (Signed)
[facility]) Care Management  11/28/2015  [name] Feb 28, 1971 [id number]   Unsuccessful attempt to reach patient by phone.  HIPPA appropriate message left requesting call back.  If no response, RN will make another attempt to contact patient within 2 weeks.  [name], RN, MSN RN [employer] [phone] Fax [phone]

## 2015-11-29 ENCOUNTER — Telehealth (HOSPITAL_COMMUNITY): Payer: Self-pay | Admitting: Pharmacist

## 2015-11-29 NOTE — Telephone Encounter (Signed)
Corlanor 5 mg BID PA approved by [facility] Medicare through 11/28/17.   [name]. Bonnye Fava, PharmD, BCPS, CPP Clinical Pharmacist Pager: [phone] Phone: [phone] 11/29/2015 2:55 PM

## 2015-12-04 DIAGNOSIS — M546 Pain in thoracic spine: Secondary | ICD-10-CM | POA: Diagnosis not present

## 2015-12-04 DIAGNOSIS — M961 Postlaminectomy syndrome, not elsewhere classified: Secondary | ICD-10-CM | POA: Diagnosis not present

## 2015-12-04 DIAGNOSIS — M5416 Radiculopathy, lumbar region: Secondary | ICD-10-CM | POA: Diagnosis not present

## 2015-12-04 DIAGNOSIS — Z6836 Body mass index (BMI) 36.0-36.9, adult: Secondary | ICD-10-CM | POA: Diagnosis not present

## 2015-12-10 ENCOUNTER — Other Ambulatory Visit: Payer: Self-pay

## 2015-12-10 VITALS — Ht 65.0 in | Wt 175.0 lb

## 2015-12-10 DIAGNOSIS — Z794 Long term (current) use of insulin: Principal | ICD-10-CM

## 2015-12-10 DIAGNOSIS — E1110 Type 2 diabetes mellitus with ketoacidosis without coma: Secondary | ICD-10-CM

## 2015-12-10 NOTE — Patient Outreach (Signed)
[facility]) Care Management  12/10/2015  [name] Jan 11, 1971 [id number]   Telephone follow-up with patient.  She reports she is doing well, and taking her medications as prescribed.  She states her cbgs have been no higher than 100, and that her weight is down to 175 pounds.  She is very pleased with the progress she has made.  Patient requests on-going [facility] involvement "to keep her on track".    RN will follow up in approximately 6 weeks.  [name], RN, MSN RN [employer] [phone] Fax [phone]

## 2015-12-24 ENCOUNTER — Encounter: Payer: Self-pay | Admitting: Gastroenterology

## 2015-12-24 ENCOUNTER — Encounter (HOSPITAL_COMMUNITY): Payer: Self-pay

## 2015-12-24 ENCOUNTER — Ambulatory Visit (HOSPITAL_COMMUNITY)
Admission: RE | Admit: 2015-12-24 | Discharge: 2015-12-24 | Disposition: A | Discharge status: Home / Self Care | Payer: Commercial Managed Care - HMO | Source: Ambulatory Visit | Attending: Cardiology | Admitting: Cardiology

## 2015-12-24 VITALS — BP 126/86 | HR 82 | Wt 180.0 lb

## 2015-12-24 DIAGNOSIS — I110 Hypertensive heart disease with heart failure: Secondary | ICD-10-CM | POA: Diagnosis not present

## 2015-12-24 DIAGNOSIS — Z794 Long term (current) use of insulin: Secondary | ICD-10-CM | POA: Diagnosis not present

## 2015-12-24 DIAGNOSIS — D649 Anemia, unspecified: Secondary | ICD-10-CM | POA: Diagnosis not present

## 2015-12-24 DIAGNOSIS — F319 Bipolar disorder, unspecified: Secondary | ICD-10-CM | POA: Insufficient documentation

## 2015-12-24 DIAGNOSIS — I429 Cardiomyopathy, unspecified: Secondary | ICD-10-CM | POA: Diagnosis not present

## 2015-12-24 DIAGNOSIS — F411 Generalized anxiety disorder: Secondary | ICD-10-CM

## 2015-12-24 DIAGNOSIS — R531 Weakness: Secondary | ICD-10-CM

## 2015-12-24 DIAGNOSIS — G4733 Obstructive sleep apnea (adult) (pediatric): Secondary | ICD-10-CM | POA: Diagnosis not present

## 2015-12-24 DIAGNOSIS — R740 Nonspecific elevation of levels of transaminase and lactic acid dehydrogenase [LDH]: Secondary | ICD-10-CM | POA: Insufficient documentation

## 2015-12-24 DIAGNOSIS — F419 Anxiety disorder, unspecified: Secondary | ICD-10-CM | POA: Diagnosis not present

## 2015-12-24 DIAGNOSIS — R7989 Other specified abnormal findings of blood chemistry: Secondary | ICD-10-CM | POA: Insufficient documentation

## 2015-12-24 DIAGNOSIS — E119 Type 2 diabetes mellitus without complications: Secondary | ICD-10-CM | POA: Insufficient documentation

## 2015-12-24 DIAGNOSIS — I5022 Chronic systolic (congestive) heart failure: Secondary | ICD-10-CM | POA: Diagnosis not present

## 2015-12-24 DIAGNOSIS — K7689 Other specified diseases of liver: Secondary | ICD-10-CM

## 2015-12-24 DIAGNOSIS — E669 Obesity, unspecified: Secondary | ICD-10-CM | POA: Insufficient documentation

## 2015-12-24 DIAGNOSIS — E785 Hyperlipidemia, unspecified: Secondary | ICD-10-CM | POA: Insufficient documentation

## 2015-12-24 DIAGNOSIS — M797 Fibromyalgia: Secondary | ICD-10-CM | POA: Diagnosis not present

## 2015-12-24 DIAGNOSIS — Z9884 Bariatric surgery status: Secondary | ICD-10-CM | POA: Insufficient documentation

## 2015-12-24 DIAGNOSIS — N920 Excessive and frequent menstruation with regular cycle: Secondary | ICD-10-CM | POA: Diagnosis not present

## 2015-12-24 DIAGNOSIS — I5031 Acute diastolic (congestive) heart failure: Secondary | ICD-10-CM

## 2015-12-24 DIAGNOSIS — R945 Abnormal results of liver function studies: Secondary | ICD-10-CM

## 2015-12-24 LAB — COMPREHENSIVE METABOLIC PANEL
ALT: 27 U/L (ref 14–54)
ANION GAP: 10 (ref 5–15)
AST: 27 U/L (ref 15–41)
Albumin: 3.6 g/dL (ref 3.5–5.0)
Alkaline Phosphatase: 170 U/L — ABNORMAL HIGH (ref 38–126)
BILIRUBIN TOTAL: 0.9 mg/dL (ref 0.3–1.2)
BUN: 12 mg/dL (ref 6–20)
CHLORIDE: 107 mmol/L (ref 101–111)
CO2: 22 mmol/L (ref 22–32)
Calcium: 9.1 mg/dL (ref 8.9–10.3)
Creatinine, Ser: 0.71 mg/dL (ref 0.44–1.00)
GFR calc Af Amer: >60 mL/min (ref >60)
Glucose, Bld: 83 mg/dL (ref 65–99)
POTASSIUM: 3.7 mmol/L (ref 3.5–5.1)
Sodium: 139 mmol/L (ref 135–145)
TOTAL PROTEIN: 7 g/dL (ref 6.5–8.1)

## 2015-12-24 LAB — TSH: TSH: 1.494 u[IU]/mL (ref 0.350–4.500)

## 2015-12-24 LAB — BRAIN NATRIURETIC PEPTIDE: B NATRIURETIC PEPTIDE 5: 12.5 pg/mL (ref 0.0–100.0)

## 2015-12-24 MED ORDER — CARVEDILOL 6.25 MG PO TABS: 6.2500 mg | ORAL_TABLET | Freq: Two times a day (BID) | ORAL | Status: DC

## 2015-12-24 MED ORDER — SPIRONOLACTONE 25 MG PO TABS
25.0000 mg | ORAL_TABLET | Freq: Every day | ORAL | Status: DC
Start: 2015-12-24 — End: 2016-04-21

## 2015-12-24 MED ORDER — IVABRADINE HCL 5 MG PO TABS: 5.0000 mg | ORAL_TABLET | Freq: Two times a day (BID) | ORAL | Status: DC

## 2015-12-24 MED ORDER — TORSEMIDE 20 MG PO TABS: 40.0000 mg | ORAL_TABLET | Freq: Every day | ORAL | Status: DC

## 2015-12-24 MED ORDER — SACUBITRIL-VALSARTAN 97-103 MG PO TABS: ORAL_TABLET | ORAL | Status: DC

## 2015-12-24 MED ORDER — POTASSIUM CHLORIDE CRYS ER 20 MEQ PO TBCR
20.0000 meq | EXTENDED_RELEASE_TABLET | Freq: Every day | ORAL | Status: DC
Start: 2015-12-24 — End: 2017-01-05

## 2015-12-24 NOTE — Patient Instructions (Signed)
Labs today  You have been referred to [facility]  We will contact you in 6 months to schedule your next appointment.

## 2015-12-24 NOTE — Addendum Note (Signed)
Encounter addended by: Laurey Morale, MD on: 12/24/2015 11:32 PM<BR>     Documentation filed: Clinical Notes

## 2015-12-24 NOTE — Progress Notes (Signed)
Patient ID: [name], female   DOB: Jul 20, 1970, 45 y.o.   MRN: [id number] PCP: Dr. Nathanial Rancher Cardiology: Dr. Shirlee Latch  45 yo with history of nonischemic cardiomyopathy . Cardiomyopathy was diagnosed back in 5/15.  Echo at that time showed EF 20-25%.  Her sister, of note, had a heart transplantation.  She was taken for RHC/LHC showed preserved cardiac output an no angiographic CAD.  CPX showed a mild to moderate functional limitation for her age.  Echo 1/16 improved with EF 55-60%, echo in 8/16 showed EF stable at 55%.    She returns for followup today.  No exertional dyspnea, chest pain, orthopnea/PND, lightheadedness, or palpitations.  Her main concern has been significant anxiety.  She has had a lot of stress involving her son (was not sure he was going to graduate from high school).  She wakes up anxious at night (but not short of breath). Additionally, her transaminases have been running high chronically at this point.  Her weight is down 29 lbs (she has been trying to lose weight.   Labs (10/15): BNP 52, ferritin normal Labs (11/15): K 4.3, creatinine 1.2, SPEP negative, TSH normal Labs (06/05/14): K 4.5 Creatinine 1.6 metolazone was stopped.  Labs (08/24/14): K 4.0, creatinine 1.1, TSH normal, LDL 104 Labs (1/17): K 3.8, creatinine 1.0, AST 214, ALT 142 Labs (5/17): AST 254, ALT 68  PMH: 1. Nonischemic cardiomyopathy: Echo (5/15) with EF 20-25%, diffuse hypokinesis, grade II diastolic dysfunction, normal RV size and systolic function.  LHC/RHC (6/15) with no angiographic CAD; mean RA 11, PA 41/16 (mean 27), mean PCWP 15, CI 3.36, PVR 1.8.  CPX (8/15) with RER 1.14, peak VO2 13.7 (63% predicted), peak VO2 20.2 when adjusted for ideal body weight, VE/VCO2 slope 31 => mild to moderate functional limitation.  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 No history of ETOH or drug abuse.  SPEP and TSH negative, ferritin normal.  Echo 07/10/2014 with EF 55-60% Grade I DD.  Echo (8/16) with EF 55%, normal RV  size and systolic function.  2. Type II diabetes 3. HTN 4. Hyperlipidemia 5. Low back pain 6. Menorrhagia: s/p uterine ablation.  7. Bipolar disorder 8. Fibromyalgia 9. Migraines 10. Obesity: s/p gastric bypass.  11. Sleep study 6/16 with minimal OSA.  12. Anxiety 13. Elevated transaminases 14. Anemia  SH: Lives with mother in [place].  No smoking, ETOH, or drugs.   FH: Sister had heart transplant.  Uncle and grandfather with CHF.   ROS: All systems reviewed and negative except as per HPI.   Current Outpatient Prescriptions  Medication Sig Dispense Refill  . alum & mag hydroxide-simeth (MAALOX PLUS) 400-400-40 MG/5ML suspension Take by mouth every 6 (six) hours as needed for indigestion.    . Biotin 5000 MCG TABS Take 5,000 mcg by mouth daily.    Marland Kitchen buPROPion (WELLBUTRIN XL) 300 MG 24 hr tablet Take 300 mg by mouth daily before breakfast.     . carvedilol (COREG) 6.25 MG tablet Take 1 tablet (6.25 mg total) by mouth 2 (two) times daily with a meal. 60 tablet 6  . Cholecalciferol (VITAMIN D3) 5000 units TABS Take 5,000 Units by mouth.    . cyclobenzaprine (FLEXERIL) 10 MG tablet Take 10 mg by mouth 3 (three) times daily as needed for muscle spasms.     . diclofenac sodium (VOLTAREN) 1 % GEL Apply 2 g topically 2 (two) times daily.    . DULoxetine (CYMBALTA) 60 MG capsule Take 60 mg by mouth daily.    . fluticasone (  FLONASE) 50 MCG/ACT nasal spray Place 1 spray into both nostrils daily.     Marland Kitchen ibuprofen (ADVIL,MOTRIN) 800 MG tablet Take 800 mg by mouth every 8 (eight) hours as needed (pain).     . Insulin Glargine (LANTUS SOLOSTAR) 100 UNIT/ML Solostar Pen Inject 80 Units into the skin daily.     . insulin glulisine (APIDRA) 100 UNIT/ML injection Inject 5 Units into the skin 3 (three) times daily before meals.    . ivabradine (CORLANOR) 5 MG TABS tablet Take 1 tablet (5 mg total) by mouth 2 (two) times daily with a meal. 60 tablet 6  . lidocaine (LIDODERM) 5 % Place 1 patch onto the  skin daily as needed (for back pain).     . Liraglutide (VICTOZA [place]) Inject into the skin.    Marland Kitchen lubiprostone (AMITIZA) 24 MCG capsule Take 24 mcg by mouth 2 (two) times daily as needed for constipation.    Marland Kitchen oxyCODONE-acetaminophen (PERCOCET) 10-325 MG per tablet Take 0.5-1 tablets by mouth every 4 (four) hours as needed for pain.    . polyethylene glycol (MIRALAX / GLYCOLAX) packet Take 17 g by mouth 3 (three) times a week.    . potassium chloride SA (KLOR-CON M20) 20 MEQ tablet Take 1 tablet (20 mEq total) by mouth daily. 30 tablet 6  . pregabalin (LYRICA) 150 MG capsule Take 150 mg by mouth 3 (three) times daily.    . ranitidine (ZANTAC) 150 MG tablet Take 150 mg by mouth daily.    . sacubitril-valsartan (ENTRESTO) 97-103 MG TAKE 1 TABLET BY MOUTH 2 (TWO) TIMES DAILY. 60 tablet 6  . spironolactone (ALDACTONE) 25 MG tablet Take 1 tablet (25 mg total) by mouth daily. 30 tablet 6  . torsemide (DEMADEX) 20 MG tablet Take 2 tablets (40 mg total) by mouth daily. 60 tablet 6  . VENTOLIN HFA 108 (90 BASE) MCG/ACT inhaler INHALE 2 PUFFS BY MOUTH EVERY 4 TO 6 HOURS AS NEEDED FOR FOR SHORTNESS OF BREATH OR WHEEZING 8.5 Inhaler 3   No current facility-administered medications for this encounter.   BP 126/86 mmHg  Pulse 82  Wt 180 lb (81.647 kg)  SpO2 100% General: NAD Neck: Thick, JVP not elevated.  Goiter noted.  Lungs: Clear to auscultation bilaterally with normal respiratory effort. CV: Normal PMI. Heart regular S1/S2, no S3/S4, no murmur.  No edema.  No carotid bruit.  Normal pedal pulses.  Abdomen: Soft, nontender, no hepatosplenomegaly, mild distention.  Skin: Intact without lesions or rashes.  Neurologic: Alert and oriented x 3.  Psych: Normal affect. Extremities: No clubbing or cyanosis. No edema.   HEENT: Normal.   Assessment/Plan: 1. Chronic systolic CHF: Nonischemic cardiomyopathy.  Possibly familial (sister had heart transplant).  NYHA I-II symptoms.  Volume status stable without  volume overload.  CPX showed mild to moderate functional limitation in 8/15.  Echo in 1/16 showed recovery of EF to 55-60%.  Echo repeated in 8/16 showed EF 55%, normal RV size and systolic function (maintaining improvement).  - Continue current Coreg, Entresto, ivabradine, and spironolactone.  - Can continue current torsemide but need to check BMET today (needs BMET q3 months with spironolactone use).  2. Elevated transaminases: This looks like it has been present for some time now.  She does not appear to have had a workup.  - Check HBV and HCV serologies. - She is set up for abdominal US later this week to assess liver.  - I am going to refer her to GI given chronic LFT  elevation.  3. Anxiety: Needs followup with PCP.    Emoree Sasaki,MD 12/24/2015

## 2015-12-25 LAB — HEPATITIS C ANTIBODY

## 2015-12-26 DIAGNOSIS — Z6830 Body mass index (BMI) 30.0-30.9, adult: Secondary | ICD-10-CM | POA: Diagnosis not present

## 2015-12-26 DIAGNOSIS — F419 Anxiety disorder, unspecified: Secondary | ICD-10-CM | POA: Diagnosis not present

## 2015-12-27 ENCOUNTER — Telehealth (HOSPITAL_COMMUNITY): Payer: Self-pay | Admitting: *Deleted

## 2015-12-27 NOTE — Telephone Encounter (Signed)
Notes Recorded by Modesta Messing, CMA on 12/27/2015 at 11:01 AM Spoke with Pt she is aware of lab results. Notes Recorded by Laurey Morale, MD on 12/27/2015 at 12:41 AM Labs ok, HCV negative. Notes Recorded by Noralee Space, RN on 12/25/2015 at 4:26 PM Labs reviewed by RN, will forward to MD for review Notes Recorded by Noralee Space, RN on 12/24/2015 at 4:18 PM Labs reviewed by RN, will forward to MD for review

## 2016-01-02 ENCOUNTER — Ambulatory Visit
Admission: RE | Admit: 2016-01-02 | Discharge: 2016-01-02 | Disposition: A | Discharge status: Home / Self Care | Payer: Commercial Managed Care - HMO | Source: Ambulatory Visit | Attending: Family Medicine | Admitting: Family Medicine

## 2016-01-02 DIAGNOSIS — R945 Abnormal results of liver function studies: Secondary | ICD-10-CM

## 2016-01-02 DIAGNOSIS — R7989 Other specified abnormal findings of blood chemistry: Secondary | ICD-10-CM

## 2016-01-07 ENCOUNTER — Telehealth: Payer: Self-pay | Admitting: Hematology

## 2016-01-07 ENCOUNTER — Ambulatory Visit (HOSPITAL_BASED_OUTPATIENT_CLINIC_OR_DEPARTMENT_OTHER): Payer: Commercial Managed Care - HMO | Admitting: Hematology

## 2016-01-07 ENCOUNTER — Other Ambulatory Visit (HOSPITAL_BASED_OUTPATIENT_CLINIC_OR_DEPARTMENT_OTHER): Payer: Commercial Managed Care - HMO

## 2016-01-07 ENCOUNTER — Encounter: Payer: Self-pay | Admitting: Hematology

## 2016-01-07 ENCOUNTER — Telehealth: Payer: Self-pay | Admitting: *Deleted

## 2016-01-07 VITALS — BP 155/92 | HR 85 | Temp 98.4°F | Resp 19 | Ht 65.0 in | Wt 179.2 lb

## 2016-01-07 DIAGNOSIS — E119 Type 2 diabetes mellitus without complications: Secondary | ICD-10-CM

## 2016-01-07 DIAGNOSIS — N920 Excessive and frequent menstruation with regular cycle: Secondary | ICD-10-CM

## 2016-01-07 DIAGNOSIS — R5383 Other fatigue: Secondary | ICD-10-CM

## 2016-01-07 DIAGNOSIS — R740 Nonspecific elevation of levels of transaminase and lactic acid dehydrogenase [LDH]: Secondary | ICD-10-CM

## 2016-01-07 DIAGNOSIS — D500 Iron deficiency anemia secondary to blood loss (chronic): Secondary | ICD-10-CM

## 2016-01-07 DIAGNOSIS — R400 Somnolence: Secondary | ICD-10-CM | POA: Diagnosis not present

## 2016-01-07 DIAGNOSIS — D649 Anemia, unspecified: Secondary | ICD-10-CM

## 2016-01-07 DIAGNOSIS — I509 Heart failure, unspecified: Secondary | ICD-10-CM

## 2016-01-07 LAB — COMPREHENSIVE METABOLIC PANEL
ALBUMIN: 3.4 g/dL — AB (ref 3.5–5.0)
ALK PHOS: 251 U/L — AB (ref 40–150)
ALT: 99 U/L — AB (ref 0–55)
AST: 90 U/L — AB (ref 5–34)
Anion Gap: 11 mEq/L (ref 3–11)
BILIRUBIN TOTAL: 0.52 mg/dL (ref 0.20–1.20)
BUN: 16.2 mg/dL (ref 7.0–26.0)
CALCIUM: 9.1 mg/dL (ref 8.4–10.4)
CO2: 27 mEq/L (ref 22–29)
CREATININE: 0.9 mg/dL (ref 0.6–1.1)
Chloride: 105 mEq/L (ref 98–109)
EGFR: >90 mL/min/{1.73_m2} (ref >90)
GLUCOSE: 60 mg/dL — AB (ref 70–140)
POTASSIUM: 3 meq/L — AB (ref 3.5–5.1)
SODIUM: 144 meq/L (ref 136–145)
TOTAL PROTEIN: 7.2 g/dL (ref 6.4–8.3)

## 2016-01-07 LAB — CBC & DIFF AND RETIC
BASO%: 0.4 % (ref 0.0–2.0)
BASOS ABS: 0 10*3/uL (ref 0.0–0.1)
EOS%: 3.7 % (ref 0.0–7.0)
Eosinophils Absolute: 0.3 10*3/uL (ref 0.0–0.5)
HEMATOCRIT: 37.2 % (ref 34.8–46.6)
HGB: 12.8 g/dL (ref 11.6–15.9)
Immature Retic Fract: 1.9 % (ref 1.60–10.00)
LYMPH#: 1.9 10*3/uL (ref 0.9–3.3)
LYMPH%: 23.9 % (ref 14.0–49.7)
MCH: 29.8 pg (ref 25.1–34.0)
MCHC: 34.4 g/dL (ref 31.5–36.0)
MCV: 86.5 fL (ref 79.5–101.0)
MONO#: 0.5 10*3/uL (ref 0.1–0.9)
MONO%: 5.8 % (ref 0.0–14.0)
NEUT#: 5.3 10*3/uL (ref 1.5–6.5)
NEUT%: 66.2 % (ref 38.4–76.8)
PLATELETS: 288 10*3/uL (ref 145–400)
RBC: 4.3 10*6/uL (ref 3.70–5.45)
RDW: 12.9 % (ref 11.2–14.5)
RETIC CT ABS: 55.9 10*3/uL (ref 33.70–90.70)
Retic %: 1.3 % (ref 0.70–2.10)
WBC: 8 10*3/uL (ref 3.9–10.3)

## 2016-01-07 LAB — IRON AND TIBC
%SAT: 23 % (ref 21–57)
Iron: 64 ug/dL (ref 41–142)
TIBC: 278 ug/dL (ref 236–444)
UIBC: 214 ug/dL (ref 120–384)

## 2016-01-07 LAB — FERRITIN: Ferritin: 273 ng/ml — ABNORMAL HIGH (ref 9–269)

## 2016-01-07 NOTE — Telephone Encounter (Signed)
Gave pt cal & avs °

## 2016-01-07 NOTE — Progress Notes (Signed)
[address] [facility] HEMATOLOGY OFFICE PROGRESS NOTE Date of visit: 01/07/2016   [name], MD [address] [place] [address]  DIAGNOSIS: No diagnosis found.  CHIEF COMPLAIN: follow up   CURRENT TREATMENT:  Feraheme 1,020 mg given  On 10/12/13 and 03/20/2014,  08/29/14  INTERVAL HISTORY:  [name] 45 y.o. female with a history of IDA secondary to heavy menses and also a history of gastric bypass is here for follow up. She had endometrial ablation and no menstrual period since 2015. She is clinically doing moderately well, with stable symptoms. She complains of chronic back pain, for which she sees [place] pain specialist. She has moderate fatigue, worse in the past few weeks. She is able tolerate light daily activities, but does feel tired afterwards. She has lost about 30 pounds in the past 2 years, her diabetes is better controlled. She sees her cardiologist for her CHF also.  MEDICAL HISTORY: Past Medical History:  Diagnosis Date  . Anemia    hx of iron transfusions - last one 10/2013   . Anxiety   . Arthritis    neck and shoulders   . Asthma   . Back pain, chronic   . Bipolar disorder (HCC)   . Carpal tunnel syndrome on both sides   . Cataract   . Depression   . Diabetes mellitus   . Fibromyalgia   . GERD (gastroesophageal reflux disease)   . History of gastric bypass Jan 2013  . Hyperlipidemia   . Hypertension   . Insomnia   . Migraines   . Nervous breakdown sept 2013  . Neuropathy (HCC)   . Nonischemic cardiomyopathy (HCC)   . OSA (obstructive sleep apnea) 02/21/2015  . Peripheral vascular disease (HCC)   . Shortness of breath    with exertion   . Systolic CHF, acute on chronic (HCC)    CARDIOLOGIST-- DR Aris Lot NELSON  ([facility])  . Wound of left breast     INTERIM HISTORY: has Morbid obesity (HCC); IDDM (insulin dependent diabetes mellitus) (HCC); Degenerative disc disease; Nausea and vomiting; Major depressive disorder, recurrent  episode, moderate (HCC); Compulsive behavior disorder; History of gastric bypass; Anxiety disorder; Nervous breakdown; Hematochezia; Iron deficiency anemia due to chronic blood loss; DM type 2 (diabetes mellitus, type 2) (HCC); Generalized weakness; Generalized abdominal pain; Hyperactive bowel sounds; Dehydration; Insomnia; Diabetic neuropathy (HCC); Migraine, unspecified, without mention of intractable migraine without mention of status migrainosus; History of iron deficiency anemia; Chronic systolic heart failure (HCC); Left breast abscess; Breast injury; Open breast wound; Chronic systolic CHF (congestive heart failure) (HCC); Goiter; Fatigue; Excessive daytime sleepiness; Snoring; OSA (obstructive sleep apnea); and Elevated LFTs on her problem list.    ALLERGIES:  is allergic to imitrex [sumatriptan].  MEDICATIONS: has a current medication list which includes the following prescription(s): alum & mag hydroxide-simeth, biotin, bupropion, carvedilol, vitamin d3, cyclobenzaprine, diclofenac sodium, duloxetine, fluticasone, ibuprofen, insulin glargine, insulin glulisine, ivabradine, lidocaine, liraglutide, lubiprostone, oxycodone-acetaminophen, polyethylene glycol, potassium chloride sa, pregabalin, ranitidine, sacubitril-valsartan, spironolactone, torsemide, and ventolin hfa.  SURGICAL HISTORY:  Past Surgical History:  Procedure Laterality Date  . BACK SURGERY  November 1992  . BREAST SURGERY Left    I&D for abcsess   . CARDIAC CATHETERIZATION  11-14-2013  DR Marca Ancona   NORMAL LV FILLING PRESSURE W/ MILD ELEVATED RV FILLING, POSSIBLY SUFFESTING RV FAILURE OUT OF PROPORTION TO LV FAILURE. EF ABOUT 35% WITH DIFFUSE HYPOKINESIS. NO ANGIOGRAPHIC CAD, NONISCHEMIC CARDIOMYOPATHY  . CESAREAN SECTION  December 1998  .  CHOLECYSTECTOMY  1998  . DILITATION & CURRETTAGE/HYSTROSCOPY WITH NOVASURE ABLATION N/A 02/26/2014   Procedure: DILATATION & CURETTAGE/HYSTEROSCOPY WITH NOVASURE ABLATION;  Surgeon:  Meriel Pica, MD;  Location: [facility];  Service: Gynecology;  Laterality: N/A;  . ESOPHAGOGASTRODUODENOSCOPY  07/27/2011   Procedure: ESOPHAGOGASTRODUODENOSCOPY (EGD);  Surgeon: Freddy Jaksch, MD;  Location: [facility];  Service: Endoscopy;  Laterality: N/A;  . GASTRIC ROUX-EN-Y  06/30/2011   Procedure: LAPAROSCOPIC ROUX-EN-Y GASTRIC;  Surgeon: Mariella Saa, MD;  Location: [facility];  Service: General;  Laterality: N/A;     . INCISION AND DRAINAGE OF WOUND Left 12/14/2013   Procedure: IRRIGATION AND DEBRIDEMENT OF LEFT BREAST WOUND WITH PLACEMENT OF A-CELL, WITH CLOSURE ;  Surgeon: Wayland Denis, DO;  Location: [facility];  Service: Plastics;  Laterality: Left;  . IRRIGATION AND DEBRIDEMENT ABSCESS Left 11/26/2013   Procedure: IRRIGATION AND DEBRIDEMENT ABSCESS;  Surgeon: Velora Heckler, MD;  Location: [facility];  Service: General;  Laterality: Left;  . LEFT AND RIGHT HEART CATHETERIZATION WITH CORONARY ANGIOGRAM N/A 11/14/2013   Procedure: LEFT AND RIGHT HEART CATHETERIZATION WITH CORONARY ANGIOGRAM;  Surgeon: Laurey Morale, MD;  Location: [facility];  Service: Cardiovascular;  Laterality: N/A;  . SPINAL CORD STIMULATOR INSERTION N/A 03/09/2014   Procedure: LUMBAR SPINAL CORD STIMULATOR INSERTION;  Surgeon: Gwynne Edinger, MD;  Location: [facility];  Service: Neurosurgery;  Laterality: N/A;  . TUBAL LIGATION  January 1999    REVIEW OF SYSTEMS:   Constitutional: Denies fevers, chills or abnormal weight loss; Positive fatigue Eyes: Denies blurriness of vision Ears, nose, mouth, throat, and face: Denies mucositis or sore throat Respiratory: Denies cough, dyspnea or wheezes Cardiovascular: Denies palpitation, chest discomfort or lower extremity swelling Gastrointestinal:  Denies nausea, heartburn or change in bowel habits Skin: Denies abnormal skin rashes Lymphatics: Denies new lymphadenopathy or easy bruising Neurological:Denies numbness, tingling or new weaknesses Behavioral/Psych: Mood is  stable, no new changes  All other systems were reviewed with the patient and are negative.  PHYSICAL EXAMINATION: ECOG PERFORMANCE STATUS: 1 BP (!) 155/92 (BP Location: Left Arm, Patient Position: Sitting) Comment: made dr Mosetta Putt of blood pressure  and pt  wasnt feeling well blood sugar drop  Pulse 85   Temp 98.4 F (36.9 C) (Oral)   Resp 19   Ht 5\' 5"  (1.651 m)   Wt 179 lb 3.2 oz (81.3 kg)   SpO2 100%   BMI 29.82 kg/m   GENERAL:alert, no distress and comfortable; moderately obese SKIN: skin color, texture, turgor are normal, no rashes or significant lesions; multiple scarred lesion on lower extremity.  EYES: normal, Conjunctiva are pink and non-injected, sclera clear OROPHARYNX:no exudate, no erythema and lips, buccal mucosa, and tongue normal  NECK: supple, thyroid normal size, non-tender, without nodularity LYMPH:  no palpable lymphadenopathy in the cervical, axillary or supraclavicular BREASTS: No examined.  LUNGS: clear to auscultation with normal breathing effort, no wheezes or rhonchi HEART: regular rate & rhythm and no murmurs and no lower extremity edema ABDOMEN:abdomen soft, non-tender and normal bowel sounds Musculoskeletal:no cyanosis of digits and no clubbing  NEURO: alert & oriented x 3 with fluent speech, no focal motor/sensory deficits  Labs:   CBC Latest Ref Rng & Units 01/07/2016 07/16/2015 01/22/2015  WBC 3.9 - 10.3 10e3/uL 8.0 7.1 6.1  Hemoglobin 11.6 - 15.9 g/dL 48.8 89.1 69.4  Hematocrit 34.8 - 46.6 % 37.2 35.5 37.7  Platelets 145 - 400 10e3/uL 288 280 279   CMP Latest Ref Rng &  Units 01/07/2016 12/24/2015 07/16/2015  Glucose 70 - 140 mg/dl 45(W) 83 87  BUN 7.0 - 26.0 mg/dL 09.8 12 11.9  Creatinine 0.6 - 1.1 mg/dL 0.9 1.47 1.0  Sodium 829 - 145 mEq/L 144 139 141  Potassium 3.5 - 5.1 mEq/L 3.0(LL) 3.7 3.8  Chloride 101 - 111 mmol/L - 107 -  CO2 22 - 29 mEq/L 27 22 26   Calcium 8.4 - 10.4 mg/dL 9.1 9.1 9.0  Total Protein 6.4 - 8.3 g/dL 7.2 7.0 7.3  Total  Bilirubin 0.20 - 1.20 mg/dL 5.62 0.9 1.30  Alkaline Phos 40 - 150 U/L 251(H) 170(H) 261(H)  AST 5 - 34 U/L 90(H) 27 214(HH)  ALT 0 - 55 U/L 99(H) 27 142(H)    RADIOGRAPHIC STUDIES: None   ASSESSMENT: [name] 45 y.o. female with a history of No diagnosis found.   1.  H.o. severe IDA secondary to gastric bypass and menorrhagia   --Previously, her iron was consistent with iron deficiency, secondary to gastric bypass and menorrhagia. -She has had endometrial ablation in August 2015, no menstrual period since then. Her iron deficient anemia has significantly improved afterwards. -She did not respond well to oral iron, and does not want try it anymore. -Her ferritin and iron level last check was normal in August 2016. Repeated our study for from today still pending.  2. Fatigue and drowsiness -Her TSH and sleep study wall normal. -If her oral level was low, we'll replaced with IV iron, which may also contribute to her fatigue -She will continue follow-up with her primary care physician  3. DM2, CHF and nonischemic CHF  -- Continue management per PCP, her endocrinologist and cardiologist     4. Transaminitis -She still has elevated liver enzymes. Recent ultrasound was negative except for hepatic steatosis -She is scheduled to see GI soon  Follow up.  -I'll call her tomorrow with her iron study results. If it's low, we'll set up IV Feraheme -Return to clinic in 6 months for labs, she'll call me after lab test to see if she need to come back for IV Feraheme -I'll see her back in one year with lab.   All questions were answered. The patient knows to call the clinic with any problems, questions or concerns. We can certainly see the patient much sooner if necessary.  I spent 15 minutes counseling the patient face to face. The total time spent in the appointment was 20 minutes.    Malachy Mood  01/07/2016

## 2016-01-07 NOTE — Telephone Encounter (Signed)
K+  3.0  Today.   Pt is currently taking Kdur 20 meq daily by Dr. Shirlee Latch.   Spoke with pt and instructed pt re:  Per Dr. Mosetta Putt , pt needs to increase Kdur to 20 meq Twice daily for 5 days.   Instructed pt to contact Dr. Alford Highland office for further instructions on Kdur.   Pt voiced understanding.

## 2016-01-21 ENCOUNTER — Other Ambulatory Visit: Payer: Self-pay

## 2016-01-21 VITALS — Wt 175.0 lb

## 2016-01-21 DIAGNOSIS — E1110 Type 2 diabetes mellitus with ketoacidosis without coma: Secondary | ICD-10-CM

## 2016-01-21 DIAGNOSIS — Z794 Long term (current) use of insulin: Principal | ICD-10-CM

## 2016-01-21 NOTE — Patient Outreach (Signed)
[facility]) Care Management  01/21/2016  [name] 12/19/1970 [id number]  Telephone contact today.  Patient reports she continues to do well with self-management of her diabetes.  She has stable cbgs with none highter that 101, and no hypoglycemic episodes.  She is maintaining her weight around 175 pounds.  Patient reports she has a problem with her liver, and that she is scheduled to see a specialist in September about this.  She states this is causing ongoing fatigue.  Plan:  Patient will continue self-management of her diabetes.           Patient will keep MD appointments.           Patient will make appointment for vision exam.           RN will follow up in September.

## 2016-01-29 DIAGNOSIS — E1143 Type 2 diabetes mellitus with diabetic autonomic (poly)neuropathy: Secondary | ICD-10-CM | POA: Diagnosis not present

## 2016-01-29 DIAGNOSIS — I10 Essential (primary) hypertension: Secondary | ICD-10-CM | POA: Diagnosis not present

## 2016-01-29 DIAGNOSIS — E559 Vitamin D deficiency, unspecified: Secondary | ICD-10-CM | POA: Diagnosis not present

## 2016-01-29 DIAGNOSIS — F419 Anxiety disorder, unspecified: Secondary | ICD-10-CM | POA: Diagnosis not present

## 2016-01-29 DIAGNOSIS — R7989 Other specified abnormal findings of blood chemistry: Secondary | ICD-10-CM | POA: Diagnosis not present

## 2016-01-29 DIAGNOSIS — E785 Hyperlipidemia, unspecified: Secondary | ICD-10-CM | POA: Diagnosis not present

## 2016-01-29 DIAGNOSIS — Z6829 Body mass index (BMI) 29.0-29.9, adult: Secondary | ICD-10-CM | POA: Diagnosis not present

## 2016-02-26 DIAGNOSIS — M961 Postlaminectomy syndrome, not elsewhere classified: Secondary | ICD-10-CM | POA: Diagnosis not present

## 2016-02-26 DIAGNOSIS — M5416 Radiculopathy, lumbar region: Secondary | ICD-10-CM | POA: Diagnosis not present

## 2016-02-27 ENCOUNTER — Other Ambulatory Visit (INDEPENDENT_AMBULATORY_CARE_PROVIDER_SITE_OTHER): Payer: Commercial Managed Care - HMO

## 2016-02-27 ENCOUNTER — Encounter: Payer: Self-pay | Admitting: Gastroenterology

## 2016-02-27 ENCOUNTER — Ambulatory Visit (INDEPENDENT_AMBULATORY_CARE_PROVIDER_SITE_OTHER): Payer: Commercial Managed Care - HMO | Admitting: Gastroenterology

## 2016-02-27 VITALS — BP 118/68 | HR 76 | Ht 65.0 in | Wt 182.5 lb

## 2016-02-27 DIAGNOSIS — G43A0 Cyclical vomiting, not intractable: Secondary | ICD-10-CM

## 2016-02-27 DIAGNOSIS — Z862 Personal history of diseases of the blood and blood-forming organs and certain disorders involving the immune mechanism: Secondary | ICD-10-CM

## 2016-02-27 DIAGNOSIS — R7989 Other specified abnormal findings of blood chemistry: Secondary | ICD-10-CM | POA: Diagnosis not present

## 2016-02-27 DIAGNOSIS — Z794 Long term (current) use of insulin: Secondary | ICD-10-CM | POA: Diagnosis not present

## 2016-02-27 DIAGNOSIS — R634 Abnormal weight loss: Secondary | ICD-10-CM | POA: Diagnosis not present

## 2016-02-27 DIAGNOSIS — Z9889 Other specified postprocedural states: Secondary | ICD-10-CM

## 2016-02-27 DIAGNOSIS — Z9884 Bariatric surgery status: Secondary | ICD-10-CM

## 2016-02-27 DIAGNOSIS — IMO0001 Reserved for inherently not codable concepts without codable children: Secondary | ICD-10-CM

## 2016-02-27 DIAGNOSIS — R1115 Cyclical vomiting syndrome unrelated to migraine: Secondary | ICD-10-CM

## 2016-02-27 DIAGNOSIS — E119 Type 2 diabetes mellitus without complications: Secondary | ICD-10-CM

## 2016-02-27 DIAGNOSIS — R945 Abnormal results of liver function studies: Principal | ICD-10-CM

## 2016-02-27 LAB — GAMMA GT: GGT: 132 U/L — AB (ref 7–51)

## 2016-02-27 NOTE — Patient Instructions (Signed)
If you are age 45 or older, your body mass index should be between 23-30. Your Body mass index is 30.37 kg/m. If this is out of the aforementioned range listed, please consider follow up with your Primary Care Provider.  If you are age 84 or younger, your body mass index should be between 19-25. Your Body mass index is 30.37 kg/m. If this is out of the aformentioned range listed, please consider follow up with your Primary Care Provider.   You have been scheduled for an endoscopy. Please follow written instructions given to you at your visit today. If you use inhalers (even only as needed), please bring them with you on the day of your procedure. Your physician has requested that you go to www.startemmi.com and enter the access code given to you at your visit today. This web site gives a general overview about your procedure. However, you should still follow specific instructions given to you by our office regarding your preparation for the procedure.  Your physician has requested that you go to the basement for lab work before leaving today.  Thank you for choosing [facility]  Dr Amada Jupiter III

## 2016-02-27 NOTE — Progress Notes (Signed)
Gastroenterology Consult Note:  History: [name] 02/27/2016  Referring physician: Ailene RavelHAMRICK,MAURA L, MD  Reason for consult/chief complaint: elevated liver functions (per lab reports ); Nausea and vomiting (intermittent); and Constipation (uses Amitiza )   Subjective  HPI:  [name] was sent to me for consultation regarding abnormal LFTs, but she also has several GI symptoms complaints. It appears that her LFTs elevation has been present for at least a year. She also describes about 6 months of frequent vomiting with a 40 pound weight loss. However, it seems that at least some of the weight loss was intentional because her diabetes was also under poor control. No primary care notes are available, since it is outside the [facility] system. She states that her hemoglobin A1c was as high as 10 earlier this year, but the most recent check was between 6 and 7. She takes her Lantus insulin in the morning, and has done so for many years. She had a gastric bypass in 2013, and I found an upper endoscopy report by Dr. Dulce Sellaroutlaw, which appears to been done soon after the GBP. She had anastomotic edema and an ulcer. Regarding her LFTs, she has no known history of hepatitis, tested negative for hepatitis B and C in 2013. It was also reportedly checked by her cardiologist after the last visit, and she received a call saying they were normal. I'm not yet able to find them in the Epic system. She had a number of tattoos, but none in at least the last 5 years. He has never used IV drugs and has no known contacts with hepatitis positive individuals. She has no known family history of liver disease. Alcohol use is infrequent. Acid: [name] has chronic iron deficiency anemia, I reviewed the last note from the hematologist. This was apparently due to previously heavy menses before her hysterectomy, but also from poor absorption due to her GBP surgery.  ROS:  Review of Systems  Constitutional: Positive  for fatigue and unexpected weight change. Negative for appetite change.  HENT: Negative for mouth sores and voice change.   Eyes: Negative for pain and redness.  Respiratory: Negative for cough and shortness of breath.   Cardiovascular: Negative for chest pain and palpitations.  Genitourinary: Negative for dysuria and hematuria.  Musculoskeletal: Positive for back pain. Negative for arthralgias and myalgias.  Skin: Negative for pallor and rash.  Neurological: Negative for weakness and headaches.  Hematological: Negative for adenopathy.  Psychiatric/Behavioral: The patient is nervous/anxious.      Past Medical History: Past Medical History:  Diagnosis Date  . Anemia    hx of iron transfusions - last one 10/2013   . Anxiety   . Arthritis    neck and shoulders   . Asthma   . Back pain, chronic   . Bipolar disorder (HCC)   . Carpal tunnel syndrome on both sides   . Cataract   . Depression   . Diabetes mellitus   . Fibromyalgia   . GERD (gastroesophageal reflux disease)   . History of gastric bypass Jan 2013  . Hyperlipidemia   . Hypertension   . Insomnia   . Migraines   . Nervous breakdown sept 2013  . Neuropathy (HCC)   . Nonischemic cardiomyopathy (HCC)   . OSA (obstructive sleep apnea) 02/21/2015  . Peripheral vascular disease (HCC)   . Shortness of breath    with exertion   . Systolic CHF, acute on chronic (HCC)    CARDIOLOGIST-- DR Aris LotKATARINA NELSON  (Corinda GublerLEBAUER)  .  Wound of left breast    I reviewed the last note from her hematologist and her cardiologist  Past Surgical History: Past Surgical History:  Procedure Laterality Date  . BACK SURGERY  November 1992  . BREAST SURGERY Left    I&D for abcsess   . CARDIAC CATHETERIZATION  11-14-2013  DR Marca Ancona   NORMAL LV FILLING PRESSURE W/ MILD ELEVATED RV FILLING, POSSIBLY SUFFESTING RV FAILURE OUT OF PROPORTION TO LV FAILURE. EF ABOUT 35% WITH DIFFUSE HYPOKINESIS. NO ANGIOGRAPHIC CAD, NONISCHEMIC CARDIOMYOPATHY  .  CESAREAN SECTION  December 1998  . CHOLECYSTECTOMY  1998  . DILITATION & CURRETTAGE/HYSTROSCOPY WITH NOVASURE ABLATION N/A 02/26/2014   Procedure: DILATATION & CURETTAGE/HYSTEROSCOPY WITH NOVASURE ABLATION;  Surgeon: Meriel Pica, MD;  Location: [facility];  Service: Gynecology;  Laterality: N/A;  . ESOPHAGOGASTRODUODENOSCOPY  07/27/2011   Procedure: ESOPHAGOGASTRODUODENOSCOPY (EGD);  Surgeon: Freddy Jaksch, MD;  Location: [facility];  Service: Endoscopy;  Laterality: N/A;  . GASTRIC ROUX-EN-Y  06/30/2011   Procedure: LAPAROSCOPIC ROUX-EN-Y GASTRIC;  Surgeon: Mariella Saa, MD;  Location: [facility];  Service: General;  Laterality: N/A;     . INCISION AND DRAINAGE OF WOUND Left 12/14/2013   Procedure: IRRIGATION AND DEBRIDEMENT OF LEFT BREAST WOUND WITH PLACEMENT OF A-CELL, WITH CLOSURE ;  Surgeon: Wayland Denis, DO;  Location: [facility];  Service: Plastics;  Laterality: Left;  . IRRIGATION AND DEBRIDEMENT ABSCESS Left 11/26/2013   Procedure: IRRIGATION AND DEBRIDEMENT ABSCESS;  Surgeon: Velora Heckler, MD;  Location: [facility];  Service: General;  Laterality: Left;  . LEFT AND RIGHT HEART CATHETERIZATION WITH CORONARY ANGIOGRAM N/A 11/14/2013   Procedure: LEFT AND RIGHT HEART CATHETERIZATION WITH CORONARY ANGIOGRAM;  Surgeon: Laurey Morale, MD;  Location: [facility];  Service: Cardiovascular;  Laterality: N/A;  . SPINAL CORD STIMULATOR INSERTION N/A 03/09/2014   Procedure: LUMBAR SPINAL CORD STIMULATOR INSERTION;  Surgeon: Gwynne Edinger, MD;  Location: [facility];  Service: Neurosurgery;  Laterality: N/A;  . TUBAL LIGATION  January 1999     Family History: Family History  Problem Relation Age of Onset  . Hypertension Mother   . Diabetes Maternal Aunt   . Diabetes Paternal Aunt   . Diabetes Paternal Grandfather   . Hypertension Paternal Grandfather   . Diabetes Paternal Grandmother   . Heart disease Brother   . Heart attack Neg Hx   . Stroke Neg Hx     Social History: Social History    Social History  . Marital status: Single    Spouse name: N/A  . Number of children: N/A  . Years of education: N/A   Social History Main Topics  . Smoking status: Never Smoker  . Smokeless tobacco: Never Used  . Alcohol use No  . Drug use: No  . Sexual activity: No   Other Topics Concern  . None   Social History Narrative   ** Merged History Encounter **        Allergies: Allergies  Allergen Reactions  . Imitrex [Sumatriptan] Other (See Comments)    Acid reflux    Outpatient Meds: Current Outpatient Prescriptions  Medication Sig Dispense Refill  . alum & mag hydroxide-simeth (MAALOX PLUS) 400-400-40 MG/5ML suspension Take by mouth every 6 (six) hours as needed for indigestion.    . Biotin 5000 MCG TABS Take 5,000 mcg by mouth daily.    Marland Kitchen buPROPion (WELLBUTRIN XL) 300 MG 24 hr tablet Take 300 mg by mouth daily before breakfast.     .  carvedilol (COREG) 6.25 MG tablet Take 1 tablet (6.25 mg total) by mouth 2 (two) times daily with a meal. 60 tablet 6  . Cholecalciferol (VITAMIN D3) 5000 units TABS Take 5,000 Units by mouth.    . cyclobenzaprine (FLEXERIL) 10 MG tablet Take 10 mg by mouth 3 (three) times daily as needed for muscle spasms.     . diclofenac sodium (VOLTAREN) 1 % GEL Apply 2 g topically 2 (two) times daily.    . DULoxetine (CYMBALTA) 60 MG capsule Take 60 mg by mouth daily.    . fluticasone (FLONASE) 50 MCG/ACT nasal spray Place 1 spray into both nostrils daily.     Marland Kitchen ibuprofen (ADVIL,MOTRIN) 800 MG tablet Take 800 mg by mouth every 8 (eight) hours as needed (pain).     . Insulin Glargine (LANTUS SOLOSTAR) 100 UNIT/ML Solostar Pen Inject 80 Units into the skin daily.     . insulin glulisine (APIDRA) 100 UNIT/ML injection Inject 5 Units into the skin 3 (three) times daily before meals.    . ivabradine (CORLANOR) 5 MG TABS tablet Take 1 tablet (5 mg total) by mouth 2 (two) times daily with a meal. 60 tablet 6  . lidocaine (LIDODERM) 5 % Place 1 patch onto the  skin daily as needed (for back pain).     . Liraglutide (VICTOZA [place]) Inject into the skin.    Marland Kitchen lubiprostone (AMITIZA) 24 MCG capsule Take 24 mcg by mouth 2 (two) times daily as needed for constipation.    Marland Kitchen oxyCODONE-acetaminophen (PERCOCET) 10-325 MG per tablet Take 0.5-1 tablets by mouth every 4 (four) hours as needed for pain.    . polyethylene glycol (MIRALAX / GLYCOLAX) packet Take 17 g by mouth 3 (three) times a week.    . potassium chloride SA (KLOR-CON M20) 20 MEQ tablet Take 1 tablet (20 mEq total) by mouth daily. 30 tablet 6  . pregabalin (LYRICA) 150 MG capsule Take 150 mg by mouth 3 (three) times daily.    . ranitidine (ZANTAC) 150 MG tablet Take 150 mg by mouth daily.    . sacubitril-valsartan (ENTRESTO) 97-103 MG TAKE 1 TABLET BY MOUTH 2 (TWO) TIMES DAILY. 60 tablet 6  . spironolactone (ALDACTONE) 25 MG tablet Take 1 tablet (25 mg total) by mouth daily. 30 tablet 6  . torsemide (DEMADEX) 20 MG tablet Take 2 tablets (40 mg total) by mouth daily. 60 tablet 6  . VENTOLIN HFA 108 (90 BASE) MCG/ACT inhaler INHALE 2 PUFFS BY MOUTH EVERY 4 TO 6 HOURS AS NEEDED FOR FOR SHORTNESS OF BREATH OR WHEEZING 8.5 Inhaler 3   No current facility-administered medications for this visit.       ___________________________________________________________________ Objective   Exam:  BP 118/68   Pulse 76   Ht 5\' 5"  (1.651 m)   Wt 182 lb 8 oz (82.8 kg)   BMI 30.37 kg/m    General: this is a(n) Well-appearing woman with good muscle mass, pleasant and conversational   Eyes: sclera anicteric, no redness  ENT: oral mucosa moist without lesions, no cervical or supraclavicular lymphadenopathy, good dentition  CV: RRR without murmur, S1/S2, no JVD, no peripheral edema  Resp: clear to auscultation bilaterally, normal RR and effort noted  GI: soft, mild epigastric and LUQ tenderness, with active bowel sounds. No guarding or palpable organomegaly noted.  Skin; warm and dry, no rash or jaundice  noted. Multiple tattoos  Neuro: awake, alert and oriented x 3. Normal gross motor function and fluent speech  Labs:  CMP  Latest Ref Rng & Units 01/07/2016 12/24/2015 07/16/2015  Glucose 70 - 140 mg/dl 08(M) 83 87  BUN 7.0 - 26.0 mg/dL 57.8 12 46.9  Creatinine 0.6 - 1.1 mg/dL 0.9 6.29 1.0  Sodium 528 - 145 mEq/L 144 139 141  Potassium 3.5 - 5.1 mEq/L 3.0(LL) 3.7 3.8  Chloride 101 - 111 mmol/L - 107 -  CO2 22 - 29 mEq/L 27 22 26   Calcium 8.4 - 10.4 mg/dL 9.1 9.1 9.0  Total Protein 6.4 - 8.3 g/dL 7.2 7.0 7.3  Total Bilirubin 0.20 - 1.20 mg/dL 4.13 0.9 2.44  Alkaline Phos 40 - 150 U/L 251(H) 170(H) 261(H)  AST 5 - 34 U/L 90(H) 27 214(HH)  ALT 0 - 55 U/L 99(H) 27 142(H)   CBC    Component Value Date/Time   WBC 8.0 01/07/2016 1225   WBC 8.0 05/02/2014 0908   RBC 4.30 01/07/2016 1225   RBC 3.62 (L) 05/02/2014 0908   HGB 12.8 01/07/2016 1225   HCT 37.2 01/07/2016 1225   PLT 288 01/07/2016 1225   MCV 86.5 01/07/2016 1225   MCH 29.8 01/07/2016 1225   MCH 30.7 05/02/2014 0908   MCHC 34.4 01/07/2016 1225   MCHC 33.3 05/02/2014 0908   RDW 12.9 01/07/2016 1225   LYMPHSABS 1.9 01/07/2016 1225   MONOABS 0.5 01/07/2016 1225   EOSABS 0.3 01/07/2016 1225   BASOSABS 0.0 01/07/2016 1225    01/29/15:  ast 61   Alt 61  ALP 212  TB 0.4 Last known HGB A1C:  9.7  In 4/16   She says recently in the 6 range Neg hbv sag and hcv ab 2013 EF 55% in2016, nml RV size/function Cholesterol panel unknown,  2012 [place]  Radiologic Studies:  Hepatic ultrasound July 2017 shows focal increased echotexture consistent with probable fatty liver, also status post cholecystectomy. The study was otherwise normal  Assessment: Encounter Diagnoses  Name Primary?  Marland Kitchen LFT elevation Yes  . Non-intractable cyclical vomiting with nausea   . Loss of weight   . IDDM (insulin dependent diabetes mellitus) (HCC)   . History of gastric bypass   . History of iron deficiency anemia     The cause of her elevated LFTs is  unclear at present. She does not exam or echocardiogram findings to suggest elevated right-sided cardiac pressures causing passive hepatic congestion. There is no biliary ductal dilatation to indicate obstruction or stone in the bile duct, and she does not have symptoms consistent with CBD stones.  None of her medicines seem typical for a cause of this pattern of LFT abnormality. She is apparently ruled out for viral hepatitis. There is no current evidence of neoplasia. She is not using illicit substances It is possible that the fatty liver from her obesity and previously poorly controlled diabetes could be causing her LFTs.  The vomiting is longer standing just the last 6 months, and it is not clear how much the weight loss is really related to it and how much may have been purposeful. Nevertheless, she could have an anastomotic stricture or just poor emptying of the gastric pouch due to prior surgery, diabetes and narcotic pain medicines.  Plan:  GGT to confirm that the elevated alkaline phosphatase is hepatic in origin. Also ASMA and AMA to rule out AIH and PBC.  EGD  The benefits and risks of the planned procedure were described in detail with the patient or (when appropriate) their health care proxy.  Risks were outlined as including, but not limited to,  bleeding, infection, perforation, adverse medication reaction leading to cardiac or pulmonary decompensation, or pancreatitis (if ERCP).  The limitation of incomplete mucosal visualization was also discussed.  No guarantees or warranties were given.  Total visit 60 minute time, over half spent reviewing records counseling and coordinating care.  Thank you for the courtesy of this consult.  Please call me with any questions or concerns.  Charlie Pitter III  CC: [name], MD

## 2016-02-28 LAB — MITOCHONDRIAL ANTIBODIES: Mitochondrial M2 Ab, IgG: <=20 Units (ref <=20.0)

## 2016-03-03 ENCOUNTER — Other Ambulatory Visit: Payer: Self-pay

## 2016-03-03 VITALS — Ht 65.0 in | Wt 179.0 lb

## 2016-03-03 DIAGNOSIS — Z794 Long term (current) use of insulin: Principal | ICD-10-CM

## 2016-03-03 DIAGNOSIS — E1110 Type 2 diabetes mellitus with ketoacidosis without coma: Secondary | ICD-10-CM

## 2016-03-03 NOTE — Patient Outreach (Signed)
[facility]) Care Management  03/03/2016  [name] 1970/11/10 [id number]   Telephone contact with patient.  She reports ongoing success in managing her diabetes.  She is taking her meds as prescribed and checking daily cbgs.  Her weight is currently 179 pounds on her own scales, but was documented at 182 at a recent medical appointment.  Her last A1C was 6.5.  Patient reports she saw Dr. Myrtie Neither for a GI consult on 02-27-16 due to some nausea and labs showing an abnormal liver function.  She is scheduled for an endoscopy on 03-13-26.  Plan:  Patient will continue self-management of diabetes.           Patient will keep medical appointment on 03-13-16.           RN will follow up in October.  [name], RN, MSN RN [employer] [phone] Fax [phone]

## 2016-03-13 ENCOUNTER — Encounter: Payer: Self-pay | Admitting: Gastroenterology

## 2016-03-13 ENCOUNTER — Ambulatory Visit (AMBULATORY_SURGERY_CENTER): Payer: Commercial Managed Care - HMO | Admitting: Gastroenterology

## 2016-03-13 VITALS — BP 137/89 | HR 77 | Temp 98.0°F | Resp 20 | Ht 65.0 in | Wt 182.0 lb

## 2016-03-13 DIAGNOSIS — I509 Heart failure, unspecified: Secondary | ICD-10-CM | POA: Diagnosis not present

## 2016-03-13 DIAGNOSIS — R945 Abnormal results of liver function studies: Principal | ICD-10-CM

## 2016-03-13 DIAGNOSIS — R7989 Other specified abnormal findings of blood chemistry: Secondary | ICD-10-CM

## 2016-03-13 DIAGNOSIS — E119 Type 2 diabetes mellitus without complications: Secondary | ICD-10-CM | POA: Diagnosis not present

## 2016-03-13 DIAGNOSIS — R1110 Vomiting, unspecified: Secondary | ICD-10-CM | POA: Diagnosis present

## 2016-03-13 DIAGNOSIS — G4733 Obstructive sleep apnea (adult) (pediatric): Secondary | ICD-10-CM | POA: Diagnosis not present

## 2016-03-13 DIAGNOSIS — R634 Abnormal weight loss: Secondary | ICD-10-CM

## 2016-03-13 LAB — GLUCOSE, CAPILLARY
GLUCOSE-CAPILLARY: 138 mg/dL — AB (ref 65–99)
Glucose-Capillary: 172 mg/dL — ABNORMAL HIGH (ref 65–99)

## 2016-03-13 MED ORDER — SODIUM CHLORIDE 0.9 % IV SOLN: 500.0000 mL | INTRAVENOUS | Status: DC

## 2016-03-13 NOTE — Progress Notes (Signed)
To recovery, report to McCoy, RN, VSS 

## 2016-03-13 NOTE — Op Note (Signed)
[facility] Patient Name: [name] Procedure Date: 03/13/2016 10:08 AM MRN: [id number] Endoscopist: Sherilyn Cooter L. Myrtie Neither , MD Age: 45 Referring MD:  Date of Birth: 02/25/1971 Gender: Female Account #: [id number] Procedure:                Upper GI endoscopy Indications:              Vomiting, Weight loss Medicines:                Monitored Anesthesia Care Procedure:                Pre-Anesthesia Assessment:                           - Prior to the procedure, a History and Physical                            was performed, and patient medications and                            allergies were reviewed. The patient's tolerance of                            previous anesthesia was also reviewed. The risks                            and benefits of the procedure and the sedation                            options and risks were discussed with the patient.                            All questions were answered, and informed consent                            was obtained. Prior Anticoagulants: The patient has                            taken no previous anticoagulant or antiplatelet                            agents. ASA Grade Assessment: III - A patient with                            severe systemic disease. After reviewing the risks                            and benefits, the patient was deemed in                            satisfactory condition to undergo the procedure.                           After obtaining informed consent, the endoscope was  passed under direct vision. Throughout the                            procedure, the patient's blood pressure, pulse, and                            oxygen saturations were monitored continuously. The                            Model GIF-[id number]) scope was introduced                            through the mouth, and advanced to the afferent and                            efferent jejunal loops.  The upper GI endoscopy was                            accomplished without difficulty. The patient                            tolerated the procedure well. Scope In: Scope Out: Findings:                 The esophagus was normal.                           Evidence of a gastric bypass was found. A gastric                            pouch with a normal size was found. The 2-lumen                            gastrojejunal anastomosis was characterized by                            healthy appearing mucosa. This was traversed. The                            pouch-to-jejunum limb was characterized by healthy                            appearing mucosa. The jejunojejunal anastomosis was                            characterized by healthy appearing mucosa. Both                            afferent and efferent limbs were deeply intubated.                           The examined jejunum was normal.                           The larynx was normal. Complications:  No immediate complications. Estimated Blood Loss:     Estimated blood loss: none. Impression:               - Normal esophagus.                           - Gastric bypass with a normal-sized pouch.                            Gastrojejunal anastomosis characterized by healthy                            appearing mucosa.                           - Normal examined jejunum.                           - Normal larynx.                           - No specimens collected.                           Vomiting appears to be due to gastric dysmotility                            from diabetes and (less so) narcotic pain medicine                            with a component of post-GBP dietary needs. Recommendation:           - Patient has a contact number available for                            emergencies. The signs and symptoms of potential                            delayed complications were discussed with the                             patient. Return to normal activities tomorrow.                            Written discharge instructions were provided to the                            patient.                           - Resume previous diet, being attentive to                            necessary post-gastric bypass dietary restrictions.                           - Continue present medications.  Follow up in my clinic in about 2 months regarding                            abnormal liver labs. Ellijah Leffel L. Myrtie Neither, MD 03/13/2016 10:36:42 AM This report has been signed electronically.

## 2016-03-13 NOTE — Patient Instructions (Signed)
Discharge instructions given. Normal exam. Resume previous medications. YOU HAD AN ENDOSCOPIC PROCEDURE TODAY AT THE [facility]:   Refer to the procedure report that was given to you for any specific questions about what was found during the examination.  If the procedure report does not answer your questions, please call your gastroenterologist to clarify.  If you requested that your care partner not be given the details of your procedure findings, then the procedure report has been included in a sealed envelope for you to review at your convenience later.  YOU SHOULD EXPECT: Some feelings of bloating in the abdomen. Passage of more gas than usual.  Walking can help get rid of the air that was put into your GI tract during the procedure and reduce the bloating. If you had a lower endoscopy (such as a colonoscopy or flexible sigmoidoscopy) you may notice spotting of blood in your stool or on the toilet paper. If you underwent a bowel prep for your procedure, you may not have a normal bowel movement for a few days.  Please Note:  You might notice some irritation and congestion in your nose or some drainage.  This is from the oxygen used during your procedure.  There is no need for concern and it should clear up in a day or so.  SYMPTOMS TO REPORT IMMEDIATELY:   Following upper endoscopy (EGD)  Vomiting of blood or coffee ground material  New chest pain or pain under the shoulder blades  Painful or persistently difficult swallowing  New shortness of breath  Fever of 100F or higher  Black, tarry-looking stools  For urgent or emergent issues, a gastroenterologist can be reached at any hour by calling [phone].   DIET:  We do recommend a small meal at first, but then you may proceed to your regular diet.  Drink plenty of fluids but you should avoid alcoholic beverages for 24 hours.  ACTIVITY:  You should plan to take it easy for the rest of today and you should NOT DRIVE or  use heavy machinery until tomorrow (because of the sedation medicines used during the test).    FOLLOW UP: Our staff will call the number listed on your records the next business day following your procedure to check on you and address any questions or concerns that you may have regarding the information given to you following your procedure. If we do not reach you, we will leave a message.  However, if you are feeling well and you are not experiencing any problems, there is no need to return our call.  We will assume that you have returned to your regular daily activities without incident.  If any biopsies were taken you will be contacted by phone or by letter within the next 1-3 weeks.  Please call us at [phone] if you have not heard about the biopsies in 3 weeks.    SIGNATURES/CONFIDENTIALITY: You and/or your care partner have signed paperwork which will be entered into your electronic medical record.  These signatures attest to the fact that that the information above on your After Visit Summary has been reviewed and is understood.  Full responsibility of the confidentiality of this discharge information lies with you and/or your care-partner. 

## 2016-03-16 ENCOUNTER — Telehealth: Payer: Self-pay | Admitting: *Deleted

## 2016-03-16 NOTE — Telephone Encounter (Signed)
  Follow up Call-  Call back number 03/13/2016  Post procedure Call Back phone  # [phone]  Permission to leave phone message Yes  Some recent data might be hidden     Patient questions:  Do you have a fever, pain , or abdominal swelling? No. Pain Score  0 *  Have you tolerated food without any problems? Yes.    Have you been able to return to your normal activities? Yes.    Do you have any questions about your discharge instructions: Diet   No. Medications  No. Follow up visit  No.  Do you have questions or concerns about your Care? No.  Actions: * If pain score is 4 or above: No action needed, pain <4.

## 2016-03-24 ENCOUNTER — Encounter (HOSPITAL_COMMUNITY): Payer: Self-pay

## 2016-03-24 ENCOUNTER — Emergency Department (HOSPITAL_COMMUNITY)
Admission: EM | Admit: 2016-03-24 | Discharge: 2016-03-24 | Disposition: A | Discharge status: Home / Self Care | Payer: Commercial Managed Care - HMO | Attending: Emergency Medicine | Admitting: Emergency Medicine

## 2016-03-24 DIAGNOSIS — R112 Nausea with vomiting, unspecified: Secondary | ICD-10-CM | POA: Diagnosis not present

## 2016-03-24 DIAGNOSIS — Z794 Long term (current) use of insulin: Secondary | ICD-10-CM | POA: Diagnosis not present

## 2016-03-24 DIAGNOSIS — R079 Chest pain, unspecified: Secondary | ICD-10-CM | POA: Diagnosis not present

## 2016-03-24 DIAGNOSIS — I110 Hypertensive heart disease with heart failure: Secondary | ICD-10-CM | POA: Insufficient documentation

## 2016-03-24 DIAGNOSIS — R42 Dizziness and giddiness: Secondary | ICD-10-CM | POA: Insufficient documentation

## 2016-03-24 DIAGNOSIS — R51 Headache: Secondary | ICD-10-CM | POA: Insufficient documentation

## 2016-03-24 DIAGNOSIS — Z79899 Other long term (current) drug therapy: Secondary | ICD-10-CM | POA: Insufficient documentation

## 2016-03-24 DIAGNOSIS — I5022 Chronic systolic (congestive) heart failure: Secondary | ICD-10-CM | POA: Insufficient documentation

## 2016-03-24 DIAGNOSIS — E119 Type 2 diabetes mellitus without complications: Secondary | ICD-10-CM | POA: Diagnosis not present

## 2016-03-24 DIAGNOSIS — J45909 Unspecified asthma, uncomplicated: Secondary | ICD-10-CM | POA: Diagnosis not present

## 2016-03-24 DIAGNOSIS — R519 Headache, unspecified: Secondary | ICD-10-CM

## 2016-03-24 LAB — BASIC METABOLIC PANEL
Anion gap: 10 (ref 5–15)
BUN: 13 mg/dL (ref 6–20)
CO2: 24 mmol/L (ref 22–32)
Calcium: 8.9 mg/dL (ref 8.9–10.3)
Chloride: 105 mmol/L (ref 101–111)
Creatinine, Ser: 0.76 mg/dL (ref 0.44–1.00)
GFR calc Af Amer: >60 mL/min (ref >60)
GFR calc non Af Amer: >60 mL/min (ref >60)
Glucose, Bld: 91 mg/dL (ref 65–99)
Potassium: 3.1 mmol/L — ABNORMAL LOW (ref 3.5–5.1)
Sodium: 139 mmol/L (ref 135–145)

## 2016-03-24 LAB — CBC WITH DIFFERENTIAL/PLATELET
Basophils Absolute: 0 10*3/uL (ref 0.0–0.1)
Basophils Relative: 0 %
Eosinophils Absolute: 0.2 10*3/uL (ref 0.0–0.7)
Eosinophils Relative: 1 %
HCT: 38.8 % (ref 36.0–46.0)
Hemoglobin: 13 g/dL (ref 12.0–15.0)
Lymphocytes Relative: 12 %
Lymphs Abs: 1.5 10*3/uL (ref 0.7–4.0)
MCH: 30.1 pg (ref 26.0–34.0)
MCHC: 33.5 g/dL (ref 30.0–36.0)
MCV: 89.8 fL (ref 78.0–100.0)
Monocytes Absolute: 0.8 10*3/uL (ref 0.1–1.0)
Monocytes Relative: 7 %
Neutro Abs: 9.8 10*3/uL — ABNORMAL HIGH (ref 1.7–7.7)
Neutrophils Relative %: 80 %
Platelets: 314 10*3/uL (ref 150–400)
RBC: 4.32 MIL/uL (ref 3.87–5.11)
RDW: 12.9 % (ref 11.5–15.5)
WBC: 12.2 10*3/uL — ABNORMAL HIGH (ref 4.0–10.5)

## 2016-03-24 LAB — CBG MONITORING, ED: GLUCOSE-CAPILLARY: 133 mg/dL — AB (ref 65–99)

## 2016-03-24 MED ORDER — KETOROLAC TROMETHAMINE 15 MG/ML IJ SOLN
15.0000 mg | Freq: Once | INTRAMUSCULAR | Status: AC
  Administered 2016-03-24: 15 mg via INTRAVENOUS
  Filled 2016-03-24: qty 1

## 2016-03-24 MED ORDER — DIPHENHYDRAMINE HCL 50 MG/ML IJ SOLN
25.0000 mg | Freq: Once | INTRAMUSCULAR | Status: AC
  Administered 2016-03-24: 25 mg via INTRAVENOUS
  Filled 2016-03-24: qty 1

## 2016-03-24 MED ORDER — PROCHLORPERAZINE EDISYLATE 5 MG/ML IJ SOLN
10.0000 mg | Freq: Once | INTRAMUSCULAR | Status: AC
  Administered 2016-03-24: 10 mg via INTRAVENOUS
  Filled 2016-03-24: qty 2

## 2016-03-24 MED ORDER — SODIUM CHLORIDE 0.9 % IV BOLUS (SEPSIS)
1000.0000 mL | Freq: Once | INTRAVENOUS | Status: AC
  Administered 2016-03-24: 1000 mL via INTRAVENOUS

## 2016-03-24 NOTE — ED Provider Notes (Signed)
[facility]-EMERGENCY DEPT Provider Note   CSN: [id number] Arrival date & time: 03/24/16  0820  By signing my name below, I, Sonum Patel, attest that this documentation has been prepared under the direction and in the presence of Raeford Razor, MD. Electronically Signed: Sonum Patel, Neurosurgeon. 03/24/16. 9:20 AM.  History   Chief Complaint Chief Complaint  Patient presents with  . Dizziness  . Blurred Vision    The history is provided by the patient. No language interpreter was used.    HPI Comments: [name] is a 45 y.o. female who presents to the Emergency Department complaining of sudden onset, constant, lightheadedness and vision changes that began 3 hours ago while getting ready to shower. She reports an associated HA and photophobia that began within an hour afterwards, about 7AM today. She also complains of some nausea, vomiting, and sharp chest pain. She reports feeling well last night. She denies increased numbness or tingling.     Past Medical History:  Diagnosis Date  . Anemia    hx of iron transfusions - last one 10/2013   . Anxiety   . Arthritis    neck and shoulders   . Asthma   . Back pain, chronic   . Bipolar disorder (HCC)   . Carpal tunnel syndrome on both sides   . Cataract   . Depression   . Diabetes mellitus   . Fibromyalgia   . GERD (gastroesophageal reflux disease)   . History of gastric bypass Jan 2013  . Hyperlipidemia   . Hypertension   . Insomnia   . Migraines   . Nervous breakdown sept 2013  . Neuropathy (HCC)   . Nonischemic cardiomyopathy (HCC)   . OSA (obstructive sleep apnea) 02/21/2015  . Peripheral vascular disease (HCC)   . Shortness of breath    with exertion   . Sleep apnea    borderline- no cpap yet  . Systolic CHF, acute on chronic (HCC)    CARDIOLOGIST-- DR Aris Lot NELSON  ([place])  . Wound of left breast     Patient Active Problem List   Diagnosis Date Noted  . Elevated LFTs 12/24/2015  . OSA (obstructive sleep apnea)  02/21/2015  . Excessive daytime sleepiness 11/29/2014  . Snoring 11/29/2014  . Goiter 09/06/2014  . Fatigue 09/06/2014  . Chronic systolic CHF (congestive heart failure) (HCC) 05/31/2014  . Open breast wound 12/26/2013  . Breast injury 12/05/2013  . Left breast abscess 11/26/2013  . Chronic systolic heart failure (HCC) 11/03/2013  . History of iron deficiency anemia 10/12/2013  . History of gastric bypass 03/13/2012  . Anxiety disorder 03/13/2012  . Nervous breakdown 03/13/2012  . Hematochezia 03/13/2012  . Iron deficiency anemia due to chronic blood loss 03/13/2012  . DM type 2 (diabetes mellitus, type 2) (HCC) 03/13/2012  . Generalized weakness 03/13/2012  . Generalized abdominal pain 03/13/2012  . Hyperactive bowel sounds 03/13/2012  . Dehydration 03/13/2012  . Insomnia 03/13/2012  . Diabetic neuropathy (HCC) 03/13/2012  . Migraine, unspecified, without mention of intractable migraine without mention of status migrainosus 03/13/2012  . Major depressive disorder, recurrent episode, moderate (HCC) 02/18/2012    Class: Acute  . Compulsive behavior disorder 02/18/2012    Class: Chronic  . Nausea and vomiting 07/21/2011  . Morbid obesity (HCC) 06/25/2011  . IDDM (insulin dependent diabetes mellitus) (HCC) 06/25/2011  . Degenerative disc disease 06/25/2011    Past Surgical History:  Procedure Laterality Date  . BACK SURGERY  November 1992  . BREAST SURGERY Left  I&D for abcsess   . CARDIAC CATHETERIZATION  11-14-2013  DR Marca AnconaALTON MCLEAN   NORMAL LV FILLING PRESSURE W/ MILD ELEVATED RV FILLING, POSSIBLY SUFFESTING RV FAILURE OUT OF PROPORTION TO LV FAILURE. EF ABOUT 35% WITH DIFFUSE HYPOKINESIS. NO ANGIOGRAPHIC CAD, NONISCHEMIC CARDIOMYOPATHY  . CESAREAN SECTION  December 1998  . CHOLECYSTECTOMY  1998  . DILITATION & CURRETTAGE/HYSTROSCOPY WITH NOVASURE ABLATION N/A 02/26/2014   Procedure: DILATATION & CURETTAGE/HYSTEROSCOPY WITH NOVASURE ABLATION;  Surgeon: Meriel Picaichard M Holland,  MD;  Location: [facility];  Service: Gynecology;  Laterality: N/A;  . ESOPHAGOGASTRODUODENOSCOPY  07/27/2011   Procedure: ESOPHAGOGASTRODUODENOSCOPY (EGD);  Surgeon: Freddy JakschWilliam M Outlaw, MD;  [facility];  Service: Endoscopy;  Laterality: N/A;  . GASTRIC ROUX-EN-Y  06/30/2011   Procedure: LAPAROSCOPIC ROUX-EN-Y GASTRIC;  Surgeon: Mariella SaaBenjamin T Hoxworth, MD;  Location: [facility];  Service: General;  Laterality: N/A;     . INCISION AND DRAINAGE OF WOUND Left 12/14/2013   Procedure: IRRIGATION AND DEBRIDEMENT OF LEFT BREAST WOUND WITH PLACEMENT OF A-CELL, WITH CLOSURE ;  Surgeon: Wayland Denislaire Sanger, DO;  Location: [facility];  Service: Plastics;  Laterality: Left;  . IRRIGATION AND DEBRIDEMENT ABSCESS Left 11/26/2013   Procedure: IRRIGATION AND DEBRIDEMENT ABSCESS;  Surgeon: Velora Hecklerodd M Gerkin, MD;  Location: [facility];  Service: General;  Laterality: Left;  . LEFT AND RIGHT HEART CATHETERIZATION WITH CORONARY ANGIOGRAM N/A 11/14/2013   Procedure: LEFT AND RIGHT HEART CATHETERIZATION WITH CORONARY ANGIOGRAM;  Surgeon: Laurey Moralealton S McLean, MD;  [facility];  Service: Cardiovascular;  Laterality: N/A;  . SPINAL CORD STIMULATOR INSERTION N/A 03/09/2014   Procedure: LUMBAR SPINAL CORD STIMULATOR INSERTION;  Surgeon: Gwynne EdingerPaul C Harkins, MD;  Location: [facility];  Service: Neurosurgery;  Laterality: N/A;  . TUBAL LIGATION  January 1999    OB History    No data available       Home Medications    Prior to Admission medications   Medication Sig Start Date End Date Taking? Authorizing Provider  alum & mag hydroxide-simeth (MAALOX PLUS) 400-400-40 MG/5ML suspension Take 10 mLs by mouth every 6 (six) hours as needed for indigestion.    Yes Historical Provider, MD  Biotin 5000 MCG TABS Take 5,000 mcg by mouth daily.   Yes Historical Provider, MD  buPROPion (WELLBUTRIN XL) 300 MG 24 hr tablet Take 300 mg by mouth daily before breakfast.  02/23/13  Yes Historical Provider, MD  busPIRone (BUSPAR) 15 MG tablet Take 15 mg by  mouth 3 (three) times daily. 03/13/16  Yes Historical Provider, MD  carvedilol (COREG) 6.25 MG tablet Take 1 tablet (6.25 mg total) by mouth 2 (two) times daily with a meal. 12/24/15  Yes Laurey Moralealton S McLean, MD  Cholecalciferol (VITAMIN D3) 5000 units TABS Take 5,000 Units by mouth daily.    Yes Historical Provider, MD  cyclobenzaprine (FLEXERIL) 10 MG tablet Take 10 mg by mouth 3 (three) times daily as needed for muscle spasms.  11/23/13  Yes Historical Provider, MD  diclofenac sodium (VOLTAREN) 1 % GEL Apply 2 g topically 2 (two) times daily.   Yes Historical Provider, MD  DULoxetine (CYMBALTA) 60 MG capsule Take 60 mg by mouth daily.   Yes Historical Provider, MD  fluticasone (FLONASE) 50 MCG/ACT nasal spray Place 1 spray into both nostrils daily.  03/07/13  Yes Historical Provider, MD  ibuprofen (ADVIL,MOTRIN) 800 MG tablet Take 800 mg by mouth every 8 (eight) hours as needed (pain).    Yes Historical Provider, MD  Insulin Glargine (LANTUS SOLOSTAR) 100 UNIT/ML  Solostar Pen Inject 80 Units into the skin daily.    Yes Historical Provider, MD  ivabradine (CORLANOR) 5 MG TABS tablet Take 1 tablet (5 mg total) by mouth 2 (two) times daily with a meal. 12/24/15  Yes Laurey Morale, MD  lidocaine (LIDODERM) 5 % Place 1 patch onto the skin daily as needed (for back pain).  03/21/13  Yes Historical Provider, MD  lubiprostone (AMITIZA) 24 MCG capsule Take 24 mcg by mouth 2 (two) times daily as needed for constipation.   Yes Historical Provider, MD  oxyCODONE-acetaminophen (PERCOCET) 10-325 MG per tablet Take 0.5-1 tablets by mouth every 4 (four) hours as needed for pain.   Yes Historical Provider, MD  polyethylene glycol (MIRALAX / GLYCOLAX) packet Take 17 g by mouth 3 (three) times a week.   Yes Historical Provider, MD  potassium chloride SA (KLOR-CON M20) 20 MEQ tablet Take 1 tablet (20 mEq total) by mouth daily. 12/24/15  Yes Laurey Morale, MD  pregabalin (LYRICA) 150 MG capsule Take 150 mg by mouth 3 (three)  times daily.   Yes Historical Provider, MD  ranitidine (ZANTAC) 150 MG tablet Take 150 mg by mouth daily. 02/11/13  Yes Historical Provider, MD  sacubitril-valsartan (ENTRESTO) 97-103 MG TAKE 1 TABLET BY MOUTH 2 (TWO) TIMES DAILY. 12/24/15  Yes Laurey Morale, MD  spironolactone (ALDACTONE) 25 MG tablet Take 1 tablet (25 mg total) by mouth daily. 12/24/15  Yes Laurey Morale, MD  torsemide (DEMADEX) 20 MG tablet Take 2 tablets (40 mg total) by mouth daily. 12/24/15  Yes Laurey Morale, MD  VENTOLIN HFA 108 (90 BASE) MCG/ACT inhaler INHALE 2 PUFFS BY MOUTH EVERY 4 TO 6 HOURS AS NEEDED FOR FOR SHORTNESS OF BREATH OR WHEEZING 02/07/15  Yes Lars Masson, MD  VICTOZA 18 MG/3ML SOPN Inject 1.8 mg into the skin daily. 03/14/16  Yes Historical Provider, MD    Family History Family History  Problem Relation Age of Onset  . Hypertension Mother   . Diabetes Maternal Aunt   . Diabetes Paternal Aunt   . Diabetes Paternal Grandfather   . Hypertension Paternal Grandfather   . Diabetes Paternal Grandmother   . Heart disease Brother   . Heart attack Neg Hx   . Stroke Neg Hx   . Colon cancer Neg Hx   . Colon polyps Neg Hx   . Esophageal cancer Neg Hx   . Rectal cancer Neg Hx   . Stomach cancer Neg Hx     Social History Social History  Substance Use Topics  . Smoking status: Never Smoker  . Smokeless tobacco: Never Used  . Alcohol use No     Allergies   Imitrex [sumatriptan]   Review of Systems Review of Systems  Eyes: Positive for photophobia and visual disturbance.  Cardiovascular: Positive for chest pain.  Gastrointestinal: Positive for nausea and vomiting.  Neurological: Positive for light-headedness. Negative for numbness.  All other systems reviewed and are negative.    Physical Exam Updated Vital Signs BP (!) 149/107 (BP Location: Left Arm)   Pulse 92   Temp 97.9 F (36.6 C) (Oral)   Resp 18   Ht 5\' 5"  (1.651 m)   Wt 182 lb (82.6 kg)   SpO2 97%   BMI 30.29 kg/m    Physical Exam  Constitutional: She is oriented to person, place, and time. She appears well-developed and well-nourished. No distress.  Appears tired but non-toxic   HENT:  Head: Normocephalic and atraumatic.  Eyes:  EOM are normal.  Neck: Normal range of motion.  No nuchal rigidity   Cardiovascular: Normal rate, regular rhythm and normal heart sounds.   Pulmonary/Chest: Effort normal and breath sounds normal.  Abdominal: Soft. She exhibits no distension. There is no tenderness.  Musculoskeletal: Normal range of motion.  Neurological: She is alert and oriented to person, place, and time. She has normal strength. No cranial nerve deficit or sensory deficit. She exhibits normal muscle tone. Coordination normal.  Skin: Skin is warm and dry.  Psychiatric: She has a normal mood and affect. Judgment normal.  Nursing note and vitals reviewed.    ED Treatments / Results  DIAGNOSTIC STUDIES: Oxygen Saturation is 97% on RA, adequate by my interpretation.    COORDINATION OF CARE: 9:19 AM Discussed treatment plan with pt at bedside and pt agreed to plan.    Labs (all labs ordered are listed, but only abnormal results are displayed) Labs Reviewed  CBC WITH DIFFERENTIAL/PLATELET - Abnormal; Notable for the following:       Result Value   WBC 12.2 (*)    Neutro Abs 9.8 (*)    All other components within normal limits  BASIC METABOLIC PANEL - Abnormal; Notable for the following:    Potassium 3.1 (*)    All other components within normal limits  CBG MONITORING, ED - Abnormal; Notable for the following:    Glucose-Capillary 133 (*)    All other components within normal limits    EKG  EKG Interpretation None       Radiology No results found.  Procedures Procedures (including critical care time)  Medications Ordered in ED Medications  sodium chloride 0.9 % bolus 1,000 mL (1,000 mLs Intravenous New Bag/Given 03/24/16 1015)  ketorolac (TORADOL) 15 MG/ML injection 15 mg (15 mg  Intravenous Given 03/24/16 1016)  diphenhydrAMINE (BENADRYL) injection 25 mg (25 mg Intravenous Given 03/24/16 1015)  prochlorperazine (COMPAZINE) injection 10 mg (10 mg Intravenous Given 03/24/16 1015)     Initial Impression / Assessment and Plan / ED Course  I have reviewed the triage vital signs and the nursing notes.  Pertinent labs & imaging results that were available during my care of the patient were reviewed by me and considered in my medical decision making (see chart for details).  Clinical Course    Final Clinical Impressions(s) / ED Diagnoses   Final diagnoses:  Nonintractable headache, unspecified chronicity pattern, unspecified headache type  Dizziness    New Prescriptions New Prescriptions   No medications on file   I personally preformed the services scribed in my presence. The recorded information has been reviewed is accurate. Raeford Razor, MD.    Raeford Razor, MD 03/25/16 [phone]

## 2016-03-24 NOTE — ED Triage Notes (Addendum)
Pt presents with c/o dizziness and blurred vision. Pt reports that she was taking a shower this morning and noticed that she was lightheaded and then around 6:30, her vision began to change and she now has a headache. Pt is alert and oriented to person and place but not to time. Pt does not have any facial asymmetry but grip strength is moderate, not strong. No arm drifts noted, no slurred speech. Pt also c/o headache at this time.

## 2016-03-31 ENCOUNTER — Other Ambulatory Visit: Payer: Self-pay

## 2016-03-31 NOTE — Patient Outreach (Signed)
[facility]) Care Management  03/31/2016  ALANY SHIMABUKURO Jul 29, 1970 [id number]   Unsuccessful attempt to reach patient.  HIPAA appropriate message left with call back number. If no call back, RN will make another attempt in the next 2 weeks.  [name], RN, MSN RN [employer] [phone] Fax [phone]

## 2016-04-15 ENCOUNTER — Other Ambulatory Visit: Payer: Self-pay

## 2016-04-15 NOTE — Patient Outreach (Signed)
[facility]) Care Management  04/15/2016  [name] 1970-10-07 [id number]  Second unsuccessful attempt to reach patient by phone.  HIPAA appropriate message left requesting call back. If no response RN will make another attempt within 2 weeks.  [name], RN, MSN RN [employer] [phone] Fax [phone]

## 2016-04-21 ENCOUNTER — Other Ambulatory Visit (HOSPITAL_COMMUNITY): Payer: Self-pay | Admitting: *Deleted

## 2016-04-21 MED ORDER — SPIRONOLACTONE 25 MG PO TABS: 25.0000 mg | ORAL_TABLET | Freq: Every day | ORAL | 3 refills | Status: DC

## 2016-04-22 ENCOUNTER — Ambulatory Visit: Payer: Self-pay

## 2016-04-29 ENCOUNTER — Ambulatory Visit: Payer: Self-pay

## 2016-04-30 ENCOUNTER — Other Ambulatory Visit: Payer: Self-pay

## 2016-04-30 VITALS — Ht 65.0 in | Wt 179.0 lb

## 2016-04-30 DIAGNOSIS — I10 Essential (primary) hypertension: Secondary | ICD-10-CM | POA: Diagnosis not present

## 2016-04-30 DIAGNOSIS — Z794 Long term (current) use of insulin: Principal | ICD-10-CM

## 2016-04-30 DIAGNOSIS — M546 Pain in thoracic spine: Secondary | ICD-10-CM | POA: Diagnosis not present

## 2016-04-30 DIAGNOSIS — E1110 Type 2 diabetes mellitus with ketoacidosis without coma: Secondary | ICD-10-CM

## 2016-04-30 DIAGNOSIS — M5416 Radiculopathy, lumbar region: Secondary | ICD-10-CM | POA: Diagnosis not present

## 2016-04-30 DIAGNOSIS — M961 Postlaminectomy syndrome, not elsewhere classified: Secondary | ICD-10-CM | POA: Diagnosis not present

## 2016-04-30 NOTE — Patient Outreach (Signed)
[facility]) Care Management  04/30/2016  [name] 03/17/1971 [id number]   Telephone assessment with patient.  She reports on-going self-management of her diabetes, with a cbg of 101 today.  Patient reported feeling stressed due to problems with her son, and hew own struggles with numerous health problems.  She reports she is taking Buspar 78m three times a day.  Patient goals for diabetes self-management are met, but patient requests on-going telephone contacts.  She states it inspires her to keep trying to manage her health.  RN will follow up in 6 weeks.  CCandie Mile RN, MSN [phone] [phone]

## 2016-05-13 ENCOUNTER — Ambulatory Visit (INDEPENDENT_AMBULATORY_CARE_PROVIDER_SITE_OTHER): Payer: Commercial Managed Care - HMO | Admitting: Gastroenterology

## 2016-05-13 ENCOUNTER — Other Ambulatory Visit (INDEPENDENT_AMBULATORY_CARE_PROVIDER_SITE_OTHER): Payer: Commercial Managed Care - HMO

## 2016-05-13 ENCOUNTER — Encounter: Payer: Self-pay | Admitting: Gastroenterology

## 2016-05-13 VITALS — BP 122/80 | HR 80 | Ht 65.0 in | Wt 183.8 lb

## 2016-05-13 DIAGNOSIS — Z9884 Bariatric surgery status: Secondary | ICD-10-CM

## 2016-05-13 DIAGNOSIS — G43A0 Cyclical vomiting, not intractable: Secondary | ICD-10-CM | POA: Diagnosis not present

## 2016-05-13 DIAGNOSIS — R1115 Cyclical vomiting syndrome unrelated to migraine: Secondary | ICD-10-CM

## 2016-05-13 DIAGNOSIS — R945 Abnormal results of liver function studies: Secondary | ICD-10-CM

## 2016-05-13 DIAGNOSIS — R7989 Other specified abnormal findings of blood chemistry: Secondary | ICD-10-CM

## 2016-05-13 DIAGNOSIS — Z9889 Other specified postprocedural states: Secondary | ICD-10-CM | POA: Diagnosis not present

## 2016-05-13 LAB — HEPATIC FUNCTION PANEL
ALBUMIN: 3.9 g/dL (ref 3.5–5.2)
ALT: 19 U/L (ref 0–35)
AST: 23 U/L (ref 0–37)
Alkaline Phosphatase: 146 U/L — ABNORMAL HIGH (ref 39–117)
BILIRUBIN TOTAL: 0.5 mg/dL (ref 0.2–1.2)
Bilirubin, Direct: 0.1 mg/dL (ref 0.0–0.3)
TOTAL PROTEIN: 7.2 g/dL (ref 6.0–8.3)

## 2016-05-13 NOTE — Progress Notes (Signed)
[facility] Progress Note  Chief Complaint: Nausea and abnormal LFTs  Subjective  History:  [name] saw me for follow-up for the above complaints. As before, she complains of "good days and bad days". She feels intermittent nausea and chronic fatigue. Her upper endoscopy showed a healthy appearing gastric bypass, and I felt that her nausea was likely due to polypharmacy, chronic diabetes, and perhaps the effect of occasional opioid analgesic use. Further research has found that her LFTs have been elevated to variable degrees, but at least as high as the recent elevations as far back as 2013. She reports drinking red wine only a few times a year.  ROS: Cardiovascular:  no chest pain Respiratory: no dyspnea  The patient's Past Medical, Family and Social History were reviewed and are on file in the EMR.  Objective:  Med list reviewed  Vital signs in last 24 hrs: Vitals:   05/13/16 0824  BP: 122/80  Pulse: 80    Physical Exam  She seems somnolent as before  HEENT: sclera anicteric, oral mucosa moist without lesions  Neck: supple, no thyromegaly, JVD or lymphadenopathy  Cardiac: RRR without murmurs, S1S2 heard, no peripheral edema  Pulm: clear to auscultation bilaterally, normal RR and effort noted  Abdomen: soft, no tenderness, with active bowel sounds. No guarding or palpable hepatosplenomegaly.  Skin; warm and dry, no jaundice or rash  Recent Labs:  GGT 132 AMA neg  (also done in 2013) Similar LFTs in 2013 HCV neg 7/17 HBV Sag neg 2013  Radiologic studies: An ultrasound showed fatty liver   @ASSESSMENTPLANBEGIN @ Assessment: Encounter Diagnoses  Name Primary?  . Elevated LFTs   . Non-intractable cyclical vomiting with nausea Yes  . History of gastric bypass    She does not appear to have autoimmune liver disease. I've ordered smooth muscle antibody and repeat LFTs. I had ideally hoped to have an MRCP done to rule out changes of PSC, she is unable to have  that because of a spinal cord stimulator. The pattern of LFTs over these years has not been consistent with the progressively worsening pattern one would expect from [facility]. It may be from fatty liver. Her digestive symptoms are probably from the factors noted above. Plan: We will follow-up her lab results and most likely see her in about 6 months to keep track of LFTs.   Total time 25 minutes, over half spent in counseling and coordination of care.   Charlie Pitter III

## 2016-05-13 NOTE — Patient Instructions (Signed)
You have been scheduled for an MRCP at [facility]  on 05-22-2016. Your appointment time is 10a. Please arrive 30 minutes prior to your appointment time for registration purposes. Please make certain not to have anything to eat or drink 4 hours prior to your test. In addition, if you have any metal in your body, have a pacemaker or defibrillator, please be sure to let your ordering physician know. This test typically takes 45 minutes to 1 hour to complete. If you need to reschedule please call [phone]  Your physician has requested that you go to the basement for lab work before leaving today.  Thank you for choosing [facility]  Dr Amada Jupiter III

## 2016-05-14 LAB — ANTI-SMOOTH MUSCLE ANTIBODY, IGG: Smooth Muscle Ab: <20 U (ref <20)

## 2016-05-22 ENCOUNTER — Ambulatory Visit: Payer: Commercial Managed Care - HMO

## 2016-06-10 ENCOUNTER — Other Ambulatory Visit: Payer: Self-pay

## 2016-06-10 DIAGNOSIS — E1110 Type 2 diabetes mellitus with ketoacidosis without coma: Secondary | ICD-10-CM

## 2016-06-10 DIAGNOSIS — Z794 Long term (current) use of insulin: Principal | ICD-10-CM

## 2016-06-10 NOTE — Patient Outreach (Signed)
[facility]) Care Management  06/10/2016  [name] 1971-02-08 [id number]  Patient returned my call from message left earlier today.  She reports she is doing well with her diabetes management, and is maintaining her weight at around 180.  States her cbg was 102 this am.  Plan:  Patient will keep appointment with PCP on Jan 2nd.           RN will follow up in January.  [name], RN, MSN RN [employer] [phone] Fax [phone]

## 2016-06-10 NOTE — Patient Outreach (Signed)
[facility]) Care Management  06/10/2016  [name] 11-28-1970 [id number]   Unsuccessful attempt to reach patient.  HIPAA appropriate message left with contact information. If no call back, RN will make another attempt within 10 days.  [name], RN, MSN RN [employer] [phone] Fax [phone]

## 2016-06-12 ENCOUNTER — Encounter: Payer: Self-pay | Admitting: Gastroenterology

## 2016-06-16 DIAGNOSIS — E785 Hyperlipidemia, unspecified: Secondary | ICD-10-CM | POA: Diagnosis not present

## 2016-06-16 DIAGNOSIS — E669 Obesity, unspecified: Secondary | ICD-10-CM | POA: Diagnosis not present

## 2016-06-16 DIAGNOSIS — I509 Heart failure, unspecified: Secondary | ICD-10-CM | POA: Diagnosis not present

## 2016-06-16 DIAGNOSIS — D509 Iron deficiency anemia, unspecified: Secondary | ICD-10-CM | POA: Diagnosis not present

## 2016-06-16 DIAGNOSIS — E559 Vitamin D deficiency, unspecified: Secondary | ICD-10-CM | POA: Diagnosis not present

## 2016-06-16 DIAGNOSIS — I10 Essential (primary) hypertension: Secondary | ICD-10-CM | POA: Diagnosis not present

## 2016-06-16 DIAGNOSIS — E1143 Type 2 diabetes mellitus with diabetic autonomic (poly)neuropathy: Secondary | ICD-10-CM | POA: Diagnosis not present

## 2016-06-16 DIAGNOSIS — R5383 Other fatigue: Secondary | ICD-10-CM | POA: Diagnosis not present

## 2016-06-16 DIAGNOSIS — Z6831 Body mass index (BMI) 31.0-31.9, adult: Secondary | ICD-10-CM | POA: Diagnosis not present

## 2016-06-17 ENCOUNTER — Ambulatory Visit: Payer: Self-pay

## 2016-06-19 DIAGNOSIS — E538 Deficiency of other specified B group vitamins: Secondary | ICD-10-CM | POA: Diagnosis not present

## 2016-06-23 ENCOUNTER — Other Ambulatory Visit (HOSPITAL_BASED_OUTPATIENT_CLINIC_OR_DEPARTMENT_OTHER): Payer: Medicare HMO

## 2016-06-23 ENCOUNTER — Other Ambulatory Visit: Payer: Self-pay | Admitting: Hematology

## 2016-06-23 DIAGNOSIS — N920 Excessive and frequent menstruation with regular cycle: Secondary | ICD-10-CM | POA: Diagnosis not present

## 2016-06-23 DIAGNOSIS — D500 Iron deficiency anemia secondary to blood loss (chronic): Secondary | ICD-10-CM | POA: Diagnosis not present

## 2016-06-23 LAB — CBC WITH DIFFERENTIAL/PLATELET
BASO%: 0.6 % (ref 0.0–2.0)
BASOS ABS: 0.1 10*3/uL (ref 0.0–0.1)
EOS ABS: 0.3 10*3/uL (ref 0.0–0.5)
EOS%: 2.6 % (ref 0.0–7.0)
HEMATOCRIT: 35.7 % (ref 34.8–46.6)
HGB: 12.2 g/dL (ref 11.6–15.9)
LYMPH#: 1.5 10*3/uL (ref 0.9–3.3)
LYMPH%: 14.6 % (ref 14.0–49.7)
MCH: 30.6 pg (ref 25.1–34.0)
MCHC: 34.3 g/dL (ref 31.5–36.0)
MCV: 89.2 fL (ref 79.5–101.0)
MONO#: 0.6 10*3/uL (ref 0.1–0.9)
MONO%: 5.5 % (ref 0.0–14.0)
NEUT#: 7.7 10*3/uL — ABNORMAL HIGH (ref 1.5–6.5)
NEUT%: 76.7 % (ref 38.4–76.8)
Platelets: 295 10*3/uL (ref 145–400)
RBC: 4 10*6/uL (ref 3.70–5.45)
RDW: 12.9 % (ref 11.2–14.5)
WBC: 10 10*3/uL (ref 3.9–10.3)

## 2016-06-23 LAB — FERRITIN: Ferritin: 127 ng/ml (ref 9–269)

## 2016-06-23 LAB — IRON AND TIBC
%SAT: 29 % (ref 21–57)
Iron: 87 ug/dL (ref 41–142)
TIBC: 301 ug/dL (ref 236–444)
UIBC: 214 ug/dL (ref 120–384)

## 2016-06-25 ENCOUNTER — Telehealth: Payer: Self-pay | Admitting: Hematology

## 2016-06-25 NOTE — Telephone Encounter (Signed)
Appointments scheduled per scheduling message. New schedule mailed out to patient. °

## 2016-07-07 ENCOUNTER — Ambulatory Visit: Payer: Medicare HMO | Admitting: Hematology

## 2016-07-07 ENCOUNTER — Other Ambulatory Visit: Payer: Medicare HMO

## 2016-07-08 ENCOUNTER — Other Ambulatory Visit: Payer: Self-pay

## 2016-07-08 VITALS — Ht 65.0 in | Wt 175.0 lb

## 2016-07-08 DIAGNOSIS — E1110 Type 2 diabetes mellitus with ketoacidosis without coma: Secondary | ICD-10-CM

## 2016-07-08 DIAGNOSIS — Z794 Long term (current) use of insulin: Principal | ICD-10-CM

## 2016-07-08 NOTE — Patient Outreach (Signed)
[facility]) Care Management  07/08/2016  [name] Mar 09, 1971 [id number]   Telephone contact with patient.  She reports some on-going GI concerns due to her liver, but reports medications help. Patient reports ongoing success with managing her diabetes and adhering to diabetic dietary guidelines.  She states her weight is 175 pounds. Most recent A1C was 6.5.  Plan: Patient will keep scheduled appointments for dental and vision checks.          RN will follow up in February.  [name], RN, MSN RN [employer] [phone] Fax [phone]

## 2016-07-23 ENCOUNTER — Other Ambulatory Visit: Payer: Medicare HMO

## 2016-07-23 ENCOUNTER — Ambulatory Visit: Payer: Medicare HMO | Admitting: Hematology

## 2016-07-23 DIAGNOSIS — M5416 Radiculopathy, lumbar region: Secondary | ICD-10-CM | POA: Diagnosis not present

## 2016-07-23 DIAGNOSIS — M961 Postlaminectomy syndrome, not elsewhere classified: Secondary | ICD-10-CM | POA: Diagnosis not present

## 2016-07-23 DIAGNOSIS — Z9689 Presence of other specified functional implants: Secondary | ICD-10-CM | POA: Diagnosis not present

## 2016-07-23 DIAGNOSIS — M546 Pain in thoracic spine: Secondary | ICD-10-CM | POA: Diagnosis not present

## 2016-07-24 ENCOUNTER — Telehealth: Payer: Self-pay | Admitting: Hematology

## 2016-07-24 NOTE — Telephone Encounter (Signed)
lvm to inform pt of r/s lab/ov appt to 3/13 at 145 pm per LOS

## 2016-08-12 ENCOUNTER — Other Ambulatory Visit: Payer: Self-pay

## 2016-08-12 NOTE — Patient Outreach (Signed)
[facility]) Care Management  08/12/2016  GENEL KUCERA 1971/03/15 [id number]   Reached patient by phone for monthly contact.  HIPPA identifiers confirmed.  However, patient stated she was "in the middle of something", and was unable to talk.  She requested a call back at another time.  RN will make another attempt within 10 days.  [name], RN, MSN RN [employer] [phone] Fax [phone]

## 2016-08-25 ENCOUNTER — Other Ambulatory Visit: Payer: Medicare HMO

## 2016-08-25 ENCOUNTER — Encounter: Payer: Medicare HMO | Admitting: Hematology

## 2016-08-26 ENCOUNTER — Other Ambulatory Visit: Payer: Self-pay

## 2016-08-26 VITALS — Wt 175.0 lb

## 2016-08-26 DIAGNOSIS — E1110 Type 2 diabetes mellitus with ketoacidosis without coma: Secondary | ICD-10-CM

## 2016-08-26 DIAGNOSIS — Z794 Long term (current) use of insulin: Principal | ICD-10-CM

## 2016-08-26 NOTE — Progress Notes (Signed)
This encounter was created in error - please disregard.

## 2016-08-26 NOTE — Patient Outreach (Signed)
[facility]) Care Management  08/26/2016  LONETA ACEITUNO Jan 19, 1971 [id number]  Telephone contact with patient.  She reports she is doing well, and had not new problems to reports.  She states her cbg this am was 95, and that readings are continuing to be within normal range.  She also states she is taking her meds as ordered.  Plan:  Patient will continue self-management of her diabetes.           RN will follow up in May.  [name], RN, MSN RN [employer] [phone] Fax [phone]

## 2016-08-29 ENCOUNTER — Telehealth: Payer: Self-pay | Admitting: Hematology

## 2016-08-29 NOTE — Telephone Encounter (Signed)
Scheduled appt per MD. Patient is aware of new date and time.

## 2016-10-19 DIAGNOSIS — I10 Essential (primary) hypertension: Secondary | ICD-10-CM | POA: Diagnosis not present

## 2016-10-19 DIAGNOSIS — M5416 Radiculopathy, lumbar region: Secondary | ICD-10-CM | POA: Diagnosis not present

## 2016-10-19 DIAGNOSIS — M961 Postlaminectomy syndrome, not elsewhere classified: Secondary | ICD-10-CM | POA: Diagnosis not present

## 2016-10-19 DIAGNOSIS — Z9689 Presence of other specified functional implants: Secondary | ICD-10-CM | POA: Diagnosis not present

## 2016-10-27 ENCOUNTER — Telehealth (HOSPITAL_COMMUNITY): Payer: Self-pay | Admitting: Pharmacist

## 2016-10-27 NOTE — Telephone Encounter (Signed)
Entresto 97-103 mg BID PA approved by [employer] Part D through 10/27/18.   [name]. Bonnye Fava, PharmD, BCPS, CPP Clinical Pharmacist Pager: [phone] Phone: [phone] 10/27/2016 9:47 AM

## 2016-10-28 ENCOUNTER — Other Ambulatory Visit: Payer: Self-pay

## 2016-10-28 VITALS — Wt 177.0 lb

## 2016-10-28 DIAGNOSIS — E1110 Type 2 diabetes mellitus with ketoacidosis without coma: Secondary | ICD-10-CM

## 2016-10-28 DIAGNOSIS — Z794 Long term (current) use of insulin: Principal | ICD-10-CM

## 2016-10-28 NOTE — Patient Outreach (Signed)
[facility]) Care Management  10/28/2016  [name] [birth year] [id number]   Telephone contact with patient.  She reports she is doing well in managing her blood sugar, with the highest fasting cbg reported to be 119.  Her weight is essentially stable (177 pounds today).  Plan:  Patient requests ongoing telephone contacts.           RN will follow up in July.  [name], RN, MSN RN [employer] [phone] Fax [phone]

## 2016-11-10 ENCOUNTER — Encounter (HOSPITAL_COMMUNITY): Payer: Medicare HMO

## 2016-11-16 DIAGNOSIS — D510 Vitamin B12 deficiency anemia due to intrinsic factor deficiency: Secondary | ICD-10-CM | POA: Diagnosis not present

## 2016-11-16 DIAGNOSIS — Z6831 Body mass index (BMI) 31.0-31.9, adult: Secondary | ICD-10-CM | POA: Diagnosis not present

## 2016-11-16 DIAGNOSIS — I509 Heart failure, unspecified: Secondary | ICD-10-CM | POA: Diagnosis not present

## 2016-11-16 DIAGNOSIS — E785 Hyperlipidemia, unspecified: Secondary | ICD-10-CM | POA: Diagnosis not present

## 2016-11-16 DIAGNOSIS — E1143 Type 2 diabetes mellitus with diabetic autonomic (poly)neuropathy: Secondary | ICD-10-CM | POA: Diagnosis not present

## 2016-11-16 DIAGNOSIS — I10 Essential (primary) hypertension: Secondary | ICD-10-CM | POA: Diagnosis not present

## 2016-11-16 DIAGNOSIS — E559 Vitamin D deficiency, unspecified: Secondary | ICD-10-CM | POA: Diagnosis not present

## 2016-11-16 DIAGNOSIS — D509 Iron deficiency anemia, unspecified: Secondary | ICD-10-CM | POA: Diagnosis not present

## 2016-11-23 ENCOUNTER — Telehealth: Payer: Self-pay | Admitting: Hematology

## 2016-11-23 ENCOUNTER — Other Ambulatory Visit: Payer: Medicare HMO

## 2016-11-23 ENCOUNTER — Ambulatory Visit: Payer: Medicare HMO | Admitting: Hematology

## 2016-11-23 NOTE — Telephone Encounter (Signed)
Patient called and said that she has had an emergency and that she has to take her son to the ER and was reschedule for 7/2 at 2:45 for labs and to see Dr Mosetta Putt that was first available.

## 2016-12-04 ENCOUNTER — Other Ambulatory Visit: Payer: Self-pay | Admitting: Nurse Practitioner

## 2016-12-11 ENCOUNTER — Telehealth: Payer: Self-pay | Admitting: Hematology

## 2016-12-11 NOTE — Telephone Encounter (Signed)
sw pt to confirm cxl appt 7/2. Pt will keep 7/10 appts per YF

## 2016-12-14 ENCOUNTER — Other Ambulatory Visit: Payer: Medicare HMO

## 2016-12-14 ENCOUNTER — Ambulatory Visit: Payer: Medicare HMO | Admitting: Hematology

## 2016-12-15 ENCOUNTER — Other Ambulatory Visit: Payer: Self-pay | Admitting: Nurse Practitioner

## 2016-12-15 DIAGNOSIS — Z1231 Encounter for screening mammogram for malignant neoplasm of breast: Secondary | ICD-10-CM

## 2016-12-22 ENCOUNTER — Other Ambulatory Visit: Payer: Commercial Managed Care - HMO

## 2016-12-22 ENCOUNTER — Ambulatory Visit: Payer: Commercial Managed Care - HMO | Admitting: Hematology

## 2016-12-28 ENCOUNTER — Other Ambulatory Visit: Payer: Medicare HMO

## 2016-12-28 ENCOUNTER — Ambulatory Visit: Payer: Self-pay | Admitting: Hematology

## 2016-12-28 ENCOUNTER — Telehealth: Payer: Self-pay | Admitting: *Deleted

## 2016-12-28 ENCOUNTER — Encounter (HOSPITAL_COMMUNITY): Payer: Medicare HMO | Admitting: Cardiology

## 2016-12-28 ENCOUNTER — Encounter (HOSPITAL_COMMUNITY): Payer: Self-pay | Admitting: Cardiology

## 2016-12-28 ENCOUNTER — Telehealth: Payer: Self-pay | Admitting: Hematology

## 2016-12-28 ENCOUNTER — Ambulatory Visit (HOSPITAL_COMMUNITY)
Admission: RE | Admit: 2016-12-28 | Discharge: 2016-12-28 | Disposition: A | Discharge status: Home / Self Care | Payer: Medicare HMO | Source: Ambulatory Visit | Attending: Cardiology | Admitting: Cardiology

## 2016-12-28 VITALS — BP 158/86 | HR 96 | Wt 189.8 lb

## 2016-12-28 DIAGNOSIS — G4733 Obstructive sleep apnea (adult) (pediatric): Secondary | ICD-10-CM | POA: Diagnosis not present

## 2016-12-28 DIAGNOSIS — Z794 Long term (current) use of insulin: Secondary | ICD-10-CM | POA: Insufficient documentation

## 2016-12-28 DIAGNOSIS — Z8249 Family history of ischemic heart disease and other diseases of the circulatory system: Secondary | ICD-10-CM | POA: Insufficient documentation

## 2016-12-28 DIAGNOSIS — Z9889 Other specified postprocedural states: Secondary | ICD-10-CM | POA: Insufficient documentation

## 2016-12-28 DIAGNOSIS — F419 Anxiety disorder, unspecified: Secondary | ICD-10-CM | POA: Diagnosis not present

## 2016-12-28 DIAGNOSIS — I429 Cardiomyopathy, unspecified: Secondary | ICD-10-CM | POA: Diagnosis not present

## 2016-12-28 DIAGNOSIS — E119 Type 2 diabetes mellitus without complications: Secondary | ICD-10-CM | POA: Insufficient documentation

## 2016-12-28 DIAGNOSIS — Z9884 Bariatric surgery status: Secondary | ICD-10-CM | POA: Diagnosis not present

## 2016-12-28 DIAGNOSIS — E785 Hyperlipidemia, unspecified: Secondary | ICD-10-CM | POA: Diagnosis not present

## 2016-12-28 DIAGNOSIS — I5022 Chronic systolic (congestive) heart failure: Secondary | ICD-10-CM | POA: Insufficient documentation

## 2016-12-28 DIAGNOSIS — R7989 Other specified abnormal findings of blood chemistry: Secondary | ICD-10-CM | POA: Diagnosis not present

## 2016-12-28 DIAGNOSIS — M797 Fibromyalgia: Secondary | ICD-10-CM | POA: Diagnosis not present

## 2016-12-28 DIAGNOSIS — E049 Nontoxic goiter, unspecified: Secondary | ICD-10-CM | POA: Insufficient documentation

## 2016-12-28 DIAGNOSIS — F319 Bipolar disorder, unspecified: Secondary | ICD-10-CM | POA: Insufficient documentation

## 2016-12-28 DIAGNOSIS — R740 Nonspecific elevation of levels of transaminase and lactic acid dehydrogenase [LDH]: Secondary | ICD-10-CM | POA: Insufficient documentation

## 2016-12-28 DIAGNOSIS — E669 Obesity, unspecified: Secondary | ICD-10-CM | POA: Insufficient documentation

## 2016-12-28 DIAGNOSIS — Z79899 Other long term (current) drug therapy: Secondary | ICD-10-CM | POA: Insufficient documentation

## 2016-12-28 DIAGNOSIS — I110 Hypertensive heart disease with heart failure: Secondary | ICD-10-CM | POA: Insufficient documentation

## 2016-12-28 DIAGNOSIS — R945 Abnormal results of liver function studies: Secondary | ICD-10-CM

## 2016-12-28 LAB — COMPREHENSIVE METABOLIC PANEL
ALBUMIN: 3.4 g/dL — AB (ref 3.5–5.0)
ALT: 100 U/L — AB (ref 14–54)
AST: 37 U/L (ref 15–41)
Alkaline Phosphatase: 213 U/L — ABNORMAL HIGH (ref 38–126)
Anion gap: 9 (ref 5–15)
BUN: 11 mg/dL (ref 6–20)
CHLORIDE: 110 mmol/L (ref 101–111)
CO2: 18 mmol/L — AB (ref 22–32)
CREATININE: 0.86 mg/dL (ref 0.44–1.00)
Calcium: 8.3 mg/dL — ABNORMAL LOW (ref 8.9–10.3)
GFR calc non Af Amer: >60 mL/min (ref >60)
GLUCOSE: 214 mg/dL — AB (ref 65–99)
Potassium: 3.3 mmol/L — ABNORMAL LOW (ref 3.5–5.1)
SODIUM: 137 mmol/L (ref 135–145)
Total Bilirubin: 0.6 mg/dL (ref 0.3–1.2)
Total Protein: 6.6 g/dL (ref 6.5–8.1)

## 2016-12-28 LAB — TSH: TSH: 0.672 u[IU]/mL (ref 0.350–4.500)

## 2016-12-28 MED ORDER — CARVEDILOL 12.5 MG PO TABS: 12.5000 mg | ORAL_TABLET | Freq: Two times a day (BID) | ORAL | 11 refills | Status: DC

## 2016-12-28 NOTE — Patient Instructions (Signed)
Increase Carvedilol to 12.5 mg Twice daily   Labs today  Your physician has requested that you have an echocardiogram. Echocardiography is a painless test that uses sound waves to create images of your heart. It provides your doctor with information about the size and shape of your heart and how well your heart's chambers and valves are working. This procedure takes approximately one hour. There are no restrictions for this procedure.  We will contact you in 1 year to schedule your next appointment.

## 2016-12-28 NOTE — Telephone Encounter (Addendum)
Call from pt reporting cardiologist scheduled her for ultrasound today. She will need to reschedule today's office visit. Pt requests Mon or Wed AM appts if possible. Scheduling message sent. Will make MD aware.

## 2016-12-28 NOTE — Telephone Encounter (Signed)
sw pt to confirm 8/6 appt at 0800 per sch msg

## 2016-12-28 NOTE — Progress Notes (Signed)
Patient ID: [name], female   DOB: 1971/01/23, 46 y.o.   MRN: [id number] PCP: Dr. Larita Fife Cardiology: Dr. Shirlee Latch  46 yo with history of nonischemic cardiomyopathy . Cardiomyopathy was diagnosed back in 5/15.  Echo at that time showed EF 20-25%.  Her sister, of note, had a heart transplantation.  She was taken for RHC/LHC showed preserved cardiac output an no angiographic CAD.  CPX showed a mild to moderate functional limitation for her age.  Echo 1/16 improved with EF 55-60%, echo in 8/16 showed EF stable at 55%.    She returns for followup today.  Still having a lot of stress.  She has epsiodes of nonexertional left-sided chest pain that seem to be associated with emotional stress.  This has been a long-standing pattern.  She is short of breath walking up a hill, no dyspnea on flat ground.  No orthopnea/PND.  Rare ETOH use.  Still having low back pain, has a spinal cord stimulator.  Seeing GI for workup of elevated LFTs.  BP runs high.  Weight is up about 9 lbs.   ECG (personally reviewed): NSR at 95, LVH  Labs (10/15): BNP 52, ferritin normal Labs (11/15): K 4.3, creatinine 1.2, SPEP negative, TSH normal Labs (06/05/14): K 4.5 Creatinine 1.6 metolazone was stopped.  Labs (08/24/14): K 4.0, creatinine 1.1, TSH normal, LDL 104 Labs (1/17): K 3.8, creatinine 1.0, AST 214, ALT 142 Labs (5/17): AST 254, ALT 68 Labs (10/17): K 3.1, creatinine 0.76 Labs (1/18): hgb 12.12  PMH: 1. Nonischemic cardiomyopathy: Echo (5/15) with EF 20-25%, diffuse hypokinesis, grade II diastolic dysfunction, normal RV size and systolic function.  LHC/RHC (6/15) with no angiographic CAD; mean RA 11, PA 41/16 (mean 27), mean PCWP 15, CI 3.36, PVR 1.8.  CPX (8/15) with RER 1.14, peak VO2 13.7 (63% predicted), peak VO2 20.2 when adjusted for ideal body weight, VE/VCO2 slope 31 => mild to moderate functional limitation.  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 No history of ETOH or drug abuse.  SPEP and TSH negative, ferritin  normal.  Echo 07/10/2014 with EF 55-60% Grade I DD.  Echo (8/16) with EF 55%, normal RV size and systolic function.  2. Type II diabetes 3. HTN 4. Hyperlipidemia 5. Low back pain 6. Menorrhagia: s/p uterine ablation.  7. Bipolar disorder 8. Fibromyalgia 9. Migraines 10. Obesity: s/p gastric bypass.  11. Sleep study 6/16 with minimal OSA.  12. Anxiety 13. Elevated transaminases: Abdominal US with fatty liver.  AMA, ASMA, HCV, HBsAg negative.  14. Anemia  SH: Lives with mother in [place].  No smoking, ETOH, or drugs.   FH: Sister had heart transplant.  Uncle and grandfather with CHF.   ROS: All systems reviewed and negative except as per HPI.   Current Outpatient Prescriptions  Medication Sig Dispense Refill  . alum & mag hydroxide-simeth (MAALOX PLUS) 400-400-40 MG/5ML suspension Take 10 mLs by mouth every 6 (six) hours as needed for indigestion.     . Biotin 5000 MCG TABS Take 5,000 mcg by mouth daily.    Marland Kitchen buPROPion (WELLBUTRIN XL) 300 MG 24 hr tablet Take 300 mg by mouth daily before breakfast.     . busPIRone (BUSPAR) 15 MG tablet Take 15 mg by mouth 3 (three) times daily.    . carvedilol (COREG) 12.5 MG tablet Take 1 tablet (12.5 mg total) by mouth 2 (two) times daily with a meal. 60 tablet 11  . Cholecalciferol (VITAMIN D3) 5000 units TABS Take 5,000 Units by mouth daily.     Marland Kitchen  cyclobenzaprine (FLEXERIL) 10 MG tablet Take 10 mg by mouth 3 (three) times daily as needed for muscle spasms.     . diclofenac sodium (VOLTAREN) 1 % GEL Apply 2 g topically 2 (two) times daily.    . DULoxetine (CYMBALTA) 60 MG capsule Take 60 mg by mouth daily.    . fluticasone (FLONASE) 50 MCG/ACT nasal spray Place 1 spray into both nostrils daily.     Marland Kitchen ibuprofen (ADVIL,MOTRIN) 800 MG tablet Take 800 mg by mouth every 8 (eight) hours as needed (pain).     . Insulin Glargine (LANTUS SOLOSTAR) 100 UNIT/ML Solostar Pen Inject 80 Units into the skin daily.     . ivabradine (CORLANOR) 5 MG TABS tablet Take  1 tablet (5 mg total) by mouth 2 (two) times daily with a meal. 60 tablet 6  . lidocaine (LIDODERM) 5 % Place 1 patch onto the skin daily as needed (for back pain).     . lubiprostone (AMITIZA) 24 MCG capsule Take 24 mcg by mouth 2 (two) times daily as needed for constipation.    Marland Kitchen oxyCODONE-acetaminophen (PERCOCET) 10-325 MG per tablet Take 0.5-1 tablets by mouth every 4 (four) hours as needed for pain.    . polyethylene glycol (MIRALAX / GLYCOLAX) packet Take 17 g by mouth 3 (three) times a week.    . potassium chloride SA (KLOR-CON M20) 20 MEQ tablet Take 1 tablet (20 mEq total) by mouth daily. 30 tablet 6  . pregabalin (LYRICA) 150 MG capsule Take 150 mg by mouth 3 (three) times daily.    . ranitidine (ZANTAC) 150 MG tablet Take 150 mg by mouth daily.    . sacubitril-valsartan (ENTRESTO) 97-103 MG TAKE 1 TABLET BY MOUTH 2 (TWO) TIMES DAILY. 60 tablet 6  . spironolactone (ALDACTONE) 25 MG tablet Take 1 tablet (25 mg total) by mouth daily. 90 tablet 3  . torsemide (DEMADEX) 20 MG tablet Take 2 tablets (40 mg total) by mouth daily. 60 tablet 6  . VENTOLIN HFA 108 (90 BASE) MCG/ACT inhaler INHALE 2 PUFFS BY MOUTH EVERY 4 TO 6 HOURS AS NEEDED FOR FOR SHORTNESS OF BREATH OR WHEEZING 8.5 Inhaler 3  . VICTOZA 18 MG/3ML SOPN Inject 1.8 mg into the skin daily.     Current Facility-Administered Medications  Medication Dose Route Frequency Provider Last Rate Last Dose  . 0.9 %  sodium chloride infusion  500 mL Intravenous Continuous Danis, Starr Lake III, MD       BP (!) 158/86   Pulse 96   Wt 189 lb 12 oz (86.1 kg)   SpO2 99%   BMI 31.58 kg/m  General: NAD Neck: Prominent thyroid, JVP not elevated.    Lungs: Clear bilaterally CV: Normal PMI. Heart regular S1/S2, no S3/S4, no murmur.  No edema.  No carotid bruit.  Normal pedal pulses.  Abdomen: Soft, nontender, no hepatosplenomegaly, mild distention.  Skin: Intact without lesions or rashes.  Neurologic: Alert and oriented x 3.  Psych: Normal  affect. Extremities: No clubbing or cyanosis. No edema.   HEENT: Normal.   Assessment/Plan: 1. Chronic systolic CHF: Nonischemic cardiomyopathy.  Possibly familial (sister had heart transplant).  NYHA I-II symptoms.  Volume status stable without volume overload though weight is up some.  CPX showed mild to moderate functional limitation in 8/15.  Echo in 1/16 showed recovery of EF to 55-60%.  Echo repeated in 8/16 showed EF 55%, normal RV size and systolic function (maintaining improvement).  - Continue current Entresto, ivabradine, and spironolactone.  -  With elevated BP, I will increase Coreg to 12.5 mg bid.  - Can continue current torsemide but need to check BMET today.  She will need BMET every 3 months with spironolactone use.  - Repeat echo to make sure that EF remains normal.  2. Elevated transaminases: Chronic.  Abdominal US and serologies unrevealing.  Possible NASH.  - Check LFTs today.  - Continue ongoing workup with gastroenterologist.  3. HTN: As above, increase Coreg.  4. Goiter: Check TSH.   Dalton McLean,MD 12/28/2016

## 2016-12-30 ENCOUNTER — Other Ambulatory Visit: Payer: Self-pay

## 2016-12-30 ENCOUNTER — Ambulatory Visit: Payer: Self-pay

## 2016-12-30 NOTE — Patient Outreach (Signed)
[facility]) Care Management  12/30/2016  [name] [birth year] [id number]  Unsuccessful attempt to reach patient.  HIPAA appropriate message left.   Plan:  Make another attempt to contact patient within 10 days.  [name], RN, MSN RN [employer] [phone] Fax [phone]

## 2017-01-04 ENCOUNTER — Other Ambulatory Visit: Payer: Self-pay

## 2017-01-04 ENCOUNTER — Ambulatory Visit (HOSPITAL_COMMUNITY): Payer: Medicare HMO | Attending: Cardiovascular Disease

## 2017-01-04 DIAGNOSIS — F419 Anxiety disorder, unspecified: Secondary | ICD-10-CM | POA: Diagnosis not present

## 2017-01-04 DIAGNOSIS — M797 Fibromyalgia: Secondary | ICD-10-CM | POA: Insufficient documentation

## 2017-01-04 DIAGNOSIS — I110 Hypertensive heart disease with heart failure: Secondary | ICD-10-CM | POA: Insufficient documentation

## 2017-01-04 DIAGNOSIS — I429 Cardiomyopathy, unspecified: Secondary | ICD-10-CM | POA: Insufficient documentation

## 2017-01-04 DIAGNOSIS — F319 Bipolar disorder, unspecified: Secondary | ICD-10-CM | POA: Diagnosis not present

## 2017-01-04 DIAGNOSIS — E119 Type 2 diabetes mellitus without complications: Secondary | ICD-10-CM | POA: Insufficient documentation

## 2017-01-04 DIAGNOSIS — I5022 Chronic systolic (congestive) heart failure: Secondary | ICD-10-CM | POA: Diagnosis not present

## 2017-01-04 DIAGNOSIS — E785 Hyperlipidemia, unspecified: Secondary | ICD-10-CM | POA: Diagnosis not present

## 2017-01-05 ENCOUNTER — Telehealth (HOSPITAL_COMMUNITY): Payer: Self-pay | Admitting: *Deleted

## 2017-01-05 DIAGNOSIS — I5031 Acute diastolic (congestive) heart failure: Secondary | ICD-10-CM

## 2017-01-05 MED ORDER — POTASSIUM CHLORIDE CRYS ER 20 MEQ PO TBCR: 40.0000 meq | EXTENDED_RELEASE_TABLET | Freq: Every day | ORAL | 6 refills | Status: DC

## 2017-01-05 NOTE — Telephone Encounter (Signed)
Labs: Notes recorded by Noralee Space, RN on 01/05/2017 at 4:56 PM EDT Pt aware and agreeable, new rx sent in ------  Notes recorded by Modesta Messing, CMA on 12/31/2016 at 2:50 PM EDT Left vm for pt to call for lab results ------  Notes recorded by Modesta Messing, CMA on 12/28/2016 at 3:51 PM EDT Left vm for pt to call for lab results. ------  Notes recorded by Laurey Morale, MD on 12/28/2016 at 2:46 PM EDT LFTs remain elevated, sees GI. K is low, additional 20 mEq KCl to her daily regimen.   ECHO: Notes recorded by Noralee Space, RN on 01/05/2017 at 4:56 PM EDT Pt aware ------  Notes recorded by Laurey Morale, MD on 01/04/2017 at 11:00 PM EDT EF mildly reduced, 45-50%.

## 2017-01-06 ENCOUNTER — Ambulatory Visit: Payer: Self-pay

## 2017-01-07 ENCOUNTER — Telehealth (HOSPITAL_COMMUNITY): Payer: Self-pay | Admitting: Cardiology

## 2017-01-07 ENCOUNTER — Ambulatory Visit: Payer: Self-pay

## 2017-01-07 NOTE — Telephone Encounter (Signed)
Patient returned my call.  She is scheduled Monday 04/19/17 @8 :40 with Dr. Shirlee Latch.

## 2017-01-07 NOTE — Telephone Encounter (Signed)
LVM for pt to call back.  Need to schedule f/u with Dr. Shirlee Latch for November per Meredith Staggers, RN

## 2017-01-11 ENCOUNTER — Ambulatory Visit: Payer: Medicare HMO

## 2017-01-13 ENCOUNTER — Ambulatory Visit: Payer: Self-pay

## 2017-01-14 DIAGNOSIS — Z6831 Body mass index (BMI) 31.0-31.9, adult: Secondary | ICD-10-CM | POA: Diagnosis not present

## 2017-01-14 DIAGNOSIS — Z9689 Presence of other specified functional implants: Secondary | ICD-10-CM | POA: Diagnosis not present

## 2017-01-14 DIAGNOSIS — M5416 Radiculopathy, lumbar region: Secondary | ICD-10-CM | POA: Diagnosis not present

## 2017-01-14 DIAGNOSIS — I10 Essential (primary) hypertension: Secondary | ICD-10-CM | POA: Diagnosis not present

## 2017-01-18 ENCOUNTER — Other Ambulatory Visit: Payer: Medicare HMO

## 2017-01-18 ENCOUNTER — Ambulatory Visit: Payer: Self-pay | Admitting: Hematology

## 2017-01-19 ENCOUNTER — Other Ambulatory Visit: Payer: Self-pay

## 2017-01-19 ENCOUNTER — Telehealth: Payer: Self-pay | Admitting: Hematology

## 2017-01-19 NOTE — Telephone Encounter (Signed)
Scheduled appt per 8/6 sch message - patient is aware of appt date and time.

## 2017-01-19 NOTE — Patient Outreach (Signed)
[facility]) Care Management  01/19/2017  [name] 01-10-1971 [id number]  Unsuccessful attempt to reach patient.  HIPAA appropriate message left with contact information. If no response RN will make another attempt within 10 days.  [name], RN, MSN RN [employer] [phone] Fax [phone]

## 2017-01-20 ENCOUNTER — Ambulatory Visit
Admission: RE | Admit: 2017-01-20 | Discharge: 2017-01-20 | Disposition: A | Discharge status: Home / Self Care | Payer: Medicare HMO | Source: Ambulatory Visit | Attending: Nurse Practitioner | Admitting: Nurse Practitioner

## 2017-01-20 DIAGNOSIS — Z1231 Encounter for screening mammogram for malignant neoplasm of breast: Secondary | ICD-10-CM

## 2017-01-21 ENCOUNTER — Encounter (HOSPITAL_COMMUNITY): Payer: Self-pay

## 2017-01-21 ENCOUNTER — Other Ambulatory Visit: Payer: Self-pay

## 2017-01-21 NOTE — Patient Outreach (Signed)
[facility]) Care Management  01/21/2017  RAMAN FELTUS 1971-01-26 [id number]  Third unsuccessful attempt to reach patient.  HIPAA appropriate message left with call back information.  Plan:  RN will send closure letter to patient, and complete case closure if no call back within 10 days.  [name], RN, MSN RN [employer] [phone] Fax [phone]

## 2017-02-01 ENCOUNTER — Emergency Department (HOSPITAL_COMMUNITY)
Admission: EM | Admit: 2017-02-01 | Discharge: 2017-02-01 | Disposition: A | Discharge status: Home / Self Care | Payer: Medicare HMO | Attending: Emergency Medicine | Admitting: Emergency Medicine

## 2017-02-01 ENCOUNTER — Emergency Department (HOSPITAL_COMMUNITY): Payer: Medicare HMO

## 2017-02-01 DIAGNOSIS — Z79899 Other long term (current) drug therapy: Secondary | ICD-10-CM | POA: Insufficient documentation

## 2017-02-01 DIAGNOSIS — R0789 Other chest pain: Secondary | ICD-10-CM | POA: Diagnosis not present

## 2017-02-01 DIAGNOSIS — E1140 Type 2 diabetes mellitus with diabetic neuropathy, unspecified: Secondary | ICD-10-CM | POA: Diagnosis not present

## 2017-02-01 DIAGNOSIS — Z794 Long term (current) use of insulin: Secondary | ICD-10-CM | POA: Diagnosis not present

## 2017-02-01 DIAGNOSIS — I5022 Chronic systolic (congestive) heart failure: Secondary | ICD-10-CM | POA: Diagnosis not present

## 2017-02-01 DIAGNOSIS — J45909 Unspecified asthma, uncomplicated: Secondary | ICD-10-CM | POA: Diagnosis not present

## 2017-02-01 DIAGNOSIS — I110 Hypertensive heart disease with heart failure: Secondary | ICD-10-CM | POA: Diagnosis not present

## 2017-02-01 DIAGNOSIS — R079 Chest pain, unspecified: Secondary | ICD-10-CM | POA: Diagnosis not present

## 2017-02-01 LAB — BASIC METABOLIC PANEL
ANION GAP: 7 (ref 5–15)
BUN: 9 mg/dL (ref 6–20)
CALCIUM: 8.8 mg/dL — AB (ref 8.9–10.3)
CO2: 30 mmol/L (ref 22–32)
CREATININE: 0.71 mg/dL (ref 0.44–1.00)
Chloride: 105 mmol/L (ref 101–111)
GFR calc non Af Amer: >60 mL/min (ref >60)
Glucose, Bld: 136 mg/dL — ABNORMAL HIGH (ref 65–99)
Potassium: 3 mmol/L — ABNORMAL LOW (ref 3.5–5.1)
SODIUM: 142 mmol/L (ref 135–145)

## 2017-02-01 LAB — CBC
HCT: 39.2 % (ref 36.0–46.0)
HEMOGLOBIN: 13.4 g/dL (ref 12.0–15.0)
MCH: 30.2 pg (ref 26.0–34.0)
MCHC: 34.2 g/dL (ref 30.0–36.0)
MCV: 88.3 fL (ref 78.0–100.0)
PLATELETS: 327 10*3/uL (ref 150–400)
RBC: 4.44 MIL/uL (ref 3.87–5.11)
RDW: 12.6 % (ref 11.5–15.5)
WBC: 7.8 10*3/uL (ref 4.0–10.5)

## 2017-02-01 LAB — BRAIN NATRIURETIC PEPTIDE: B NATRIURETIC PEPTIDE 5: 20.5 pg/mL (ref 0.0–100.0)

## 2017-02-01 LAB — D-DIMER, QUANTITATIVE (NOT AT ARMC): D DIMER QUANT: 0.33 ug{FEU}/mL (ref 0.00–0.50)

## 2017-02-01 LAB — I-STAT TROPONIN, ED: TROPONIN I, POC: 0 ng/mL (ref 0.00–0.08)

## 2017-02-01 MED ORDER — POTASSIUM CHLORIDE CRYS ER 20 MEQ PO TBCR
40.0000 meq | EXTENDED_RELEASE_TABLET | Freq: Once | ORAL | Status: AC
  Administered 2017-02-01: 40 meq via ORAL
  Filled 2017-02-01: qty 2

## 2017-02-01 NOTE — Discharge Instructions (Signed)
Your cardiologist will call you today to schedule a follow-up visit

## 2017-02-01 NOTE — ED Notes (Signed)
Bed: WA05 Expected date:  Expected time:  Means of arrival:  Comments: 

## 2017-02-01 NOTE — ED Triage Notes (Signed)
Pt states that she has had chest pain x 1.5 months . Has seen her cardiologist since then and was dx with CHF. Alert and oriented.

## 2017-02-01 NOTE — ED Notes (Signed)
ED Provider at bedside. 

## 2017-02-01 NOTE — ED Provider Notes (Addendum)
[facility]-EMERGENCY DEPT Provider Note   CSN: [id number] Arrival date & time: 02/01/17  0908     History   Chief Complaint Chief Complaint  Patient presents with  . Chest Pain    HPI [name] is a 46 y.o. female.  Patient presents with 3 weeks of worsening intermittent left-sided chest discomfort with associated nausea. Chest discomfort is characterized as sharp and does radiate to her neck at times and lasts from seconds to minutes. It can sometimes be associated with exertion and sometimes not.he is also experiencing worsening orthopnea as well as dyspnea on exertion times several weeks. Does have a history of CHF and is on medication for that. Denies any cough or congestion. No lower extremity swelling.denies any recent changes to her medications.      Past Medical History:  Diagnosis Date  . Anemia    hx of iron transfusions - last one 10/2013   . Anxiety   . Arthritis    neck and shoulders   . Asthma   . Back pain, chronic   . Bipolar disorder (HCC)   . Carpal tunnel syndrome on both sides   . Cataract   . Depression   . Diabetes mellitus   . Fibromyalgia   . GERD (gastroesophageal reflux disease)   . History of gastric bypass Jan 2013  . Hyperlipidemia   . Hypertension   . Insomnia   . Migraines   . Nervous breakdown sept 2013  . Neuropathy   . Nonischemic cardiomyopathy (HCC)   . OSA (obstructive sleep apnea) 02/21/2015  . Peripheral vascular disease (HCC)   . Shortness of breath    with exertion   . Sleep apnea    borderline- no cpap yet  . Systolic CHF, acute on chronic (HCC)    CARDIOLOGIST-- DR Aris Lot NELSON  ([facility])  . Wound of left breast     Patient Active Problem List   Diagnosis Date Noted  . Elevated LFTs 12/24/2015  . OSA (obstructive sleep apnea) 02/21/2015  . Excessive daytime sleepiness 11/29/2014  . Snoring 11/29/2014  . Goiter 09/06/2014  . Fatigue 09/06/2014  . Chronic systolic CHF (congestive heart failure) (HCC)  05/31/2014  . Open breast wound 12/26/2013  . Breast injury 12/05/2013  . Left breast abscess 11/26/2013  . Chronic systolic heart failure (HCC) 11/03/2013  . History of iron deficiency anemia 10/12/2013  . History of gastric bypass 03/13/2012  . Anxiety disorder 03/13/2012  . Nervous breakdown 03/13/2012  . Hematochezia 03/13/2012  . Iron deficiency anemia due to chronic blood loss 03/13/2012  . DM type 2 (diabetes mellitus, type 2) (HCC) 03/13/2012  . Generalized weakness 03/13/2012  . Generalized abdominal pain 03/13/2012  . Hyperactive bowel sounds 03/13/2012  . Dehydration 03/13/2012  . Insomnia 03/13/2012  . Diabetic neuropathy (HCC) 03/13/2012  . Migraine, unspecified, without mention of intractable migraine without mention of status migrainosus 03/13/2012  . Major depressive disorder, recurrent episode, moderate (HCC) 02/18/2012    Class: Acute  . Compulsive behavior disorder 02/18/2012    Class: Chronic  . Nausea and vomiting 07/21/2011  . Morbid obesity (HCC) 06/25/2011  . IDDM (insulin dependent diabetes mellitus) (HCC) 06/25/2011  . Degenerative disc disease 06/25/2011    Past Surgical History:  Procedure Laterality Date  . BACK SURGERY  November 1992  . BREAST SURGERY Left    I&D for abcsess   . CARDIAC CATHETERIZATION  11-14-2013  DR Marca Ancona   NORMAL LV FILLING PRESSURE W/ MILD ELEVATED RV FILLING, POSSIBLY  SUFFESTING RV FAILURE OUT OF PROPORTION TO LV FAILURE. EF ABOUT 35% WITH DIFFUSE HYPOKINESIS. NO ANGIOGRAPHIC CAD, NONISCHEMIC CARDIOMYOPATHY  . CESAREAN SECTION  December 1998  . CHOLECYSTECTOMY  1998  . DILITATION & CURRETTAGE/HYSTROSCOPY WITH NOVASURE ABLATION N/A 02/26/2014   Procedure: DILATATION & CURETTAGE/HYSTEROSCOPY WITH NOVASURE ABLATION;  Surgeon: Meriel Pica, MD;  Location: [facility];  Service: Gynecology;  Laterality: N/A;  . ESOPHAGOGASTRODUODENOSCOPY  07/27/2011   Procedure: ESOPHAGOGASTRODUODENOSCOPY (EGD);  Surgeon: Freddy Jaksch,  MD;  Location: [facility];  Service: Endoscopy;  Laterality: N/A;  . GASTRIC ROUX-EN-Y  06/30/2011   Procedure: LAPAROSCOPIC ROUX-EN-Y GASTRIC;  Surgeon: Mariella Saa, MD;  Location: [facility];  Service: General;  Laterality: N/A;     . INCISION AND DRAINAGE OF WOUND Left 12/14/2013   Procedure: IRRIGATION AND DEBRIDEMENT OF LEFT BREAST WOUND WITH PLACEMENT OF A-CELL, WITH CLOSURE ;  Surgeon: Wayland Denis, DO;  Location: [facility];  Service: Plastics;  Laterality: Left;  . IRRIGATION AND DEBRIDEMENT ABSCESS Left 11/26/2013   Procedure: IRRIGATION AND DEBRIDEMENT ABSCESS;  Surgeon: Velora Heckler, MD;  Location: [facility];  Service: General;  Laterality: Left;  . LEFT AND RIGHT HEART CATHETERIZATION WITH CORONARY ANGIOGRAM N/A 11/14/2013   Procedure: LEFT AND RIGHT HEART CATHETERIZATION WITH CORONARY ANGIOGRAM;  Surgeon: Laurey Morale, MD;  Location: [facility] CATH [facility];  Service: Cardiovascular;  Laterality: N/A;  . SPINAL CORD STIMULATOR INSERTION N/A 03/09/2014   Procedure: LUMBAR SPINAL CORD STIMULATOR INSERTION;  Surgeon: Gwynne Edinger, MD;  Location: [facility];  Service: Neurosurgery;  Laterality: N/A;  . TUBAL LIGATION  January 1999    OB History    No data available       Home Medications    Prior to Admission medications   Medication Sig Start Date End Date Taking? Authorizing Provider  alum & mag hydroxide-simeth (MAALOX PLUS) 400-400-40 MG/5ML suspension Take 10 mLs by mouth every 6 (six) hours as needed for indigestion.     [provider]  Biotin 5000 MCG TABS Take 5,000 mcg by mouth daily.    [provider]  buPROPion (WELLBUTRIN XL) 300 MG 24 hr tablet Take 300 mg by mouth daily before breakfast.  02/23/13   [provider]  busPIRone (BUSPAR) 15 MG tablet Take 15 mg by mouth 3 (three) times daily. 03/13/16   [provider]  carvedilol (COREG) 12.5 MG tablet Take 1 tablet (12.5 mg total) by mouth 2 (two) times daily with a meal. 12/28/16   Laurey Morale, MD  Cholecalciferol (VITAMIN D3) 5000 units TABS Take 5,000 Units by mouth daily.     [provider]  cyclobenzaprine (FLEXERIL) 10 MG tablet Take 10 mg by mouth 3 (three) times daily as needed for muscle spasms.  11/23/13   [provider]  diclofenac sodium (VOLTAREN) 1 % GEL Apply 2 g topically 2 (two) times daily.    [provider]  DULoxetine (CYMBALTA) 60 MG capsule Take 60 mg by mouth daily.    [provider]  fluticasone (FLONASE) 50 MCG/ACT nasal spray Place 1 spray into both nostrils daily.  03/07/13   [provider]  ibuprofen (ADVIL,MOTRIN) 800 MG tablet Take 800 mg by mouth every 8 (eight) hours as needed (pain).     [provider]  Insulin Glargine (LANTUS SOLOSTAR) 100 UNIT/ML Solostar Pen Inject 80 Units into the skin daily.     [provider]  ivabradine (CORLANOR) 5 MG TABS tablet Take 1 tablet (  5 mg total) by mouth 2 (two) times daily with a meal. 12/24/15   Laurey Morale, MD  lidocaine (LIDODERM) 5 % Place 1 patch onto the skin daily as needed (for back pain).  03/21/13   [provider]  lubiprostone (AMITIZA) 24 MCG capsule Take 24 mcg by mouth 2 (two) times daily as needed for constipation.    [provider]  oxyCODONE-acetaminophen (PERCOCET) 10-325 MG per tablet Take 0.5-1 tablets by mouth every 4 (four) hours as needed for pain.    [provider]  polyethylene glycol (MIRALAX / GLYCOLAX) packet Take 17 g by mouth 3 (three) times a week.    [provider]  potassium chloride SA (KLOR-CON M20) 20 MEQ tablet Take 2 tablets (40 mEq total) by mouth daily. 01/05/17   Laurey Morale, MD  pregabalin (LYRICA) 150 MG capsule Take 150 mg by mouth 3 (three) times daily.    [provider]  ranitidine (ZANTAC) 150 MG tablet Take 150 mg by mouth daily. 02/11/13   [provider]  sacubitril-valsartan (ENTRESTO) 97-103 MG TAKE 1 TABLET BY MOUTH 2 (TWO)  TIMES DAILY. 12/24/15   Laurey Morale, MD  spironolactone (ALDACTONE) 25 MG tablet Take 1 tablet (25 mg total) by mouth daily. 04/21/16   Laurey Morale, MD  torsemide (DEMADEX) 20 MG tablet Take 2 tablets (40 mg total) by mouth daily. 12/24/15   Laurey Morale, MD  VENTOLIN HFA 108 (90 BASE) MCG/ACT inhaler INHALE 2 PUFFS BY MOUTH EVERY 4 TO 6 HOURS AS NEEDED FOR FOR SHORTNESS OF BREATH OR WHEEZING 02/07/15   Lars Masson, MD  VICTOZA 18 MG/3ML SOPN Inject 1.8 mg into the skin daily. 03/14/16   [provider]    Family History Family History  Problem Relation Age of Onset  . Hypertension Mother   . Diabetes Maternal Aunt   . Diabetes Paternal Aunt   . Diabetes Paternal Grandfather   . Hypertension Paternal Grandfather   . Diabetes Paternal Grandmother   . Heart disease Brother   . Heart attack Neg Hx   . Stroke Neg Hx   . Colon cancer Neg Hx   . Colon polyps Neg Hx   . Esophageal cancer Neg Hx   . Rectal cancer Neg Hx   . Stomach cancer Neg Hx   . Breast cancer Neg Hx     Social History Social History  Substance Use Topics  . Smoking status: Never Smoker  . Smokeless tobacco: Never Used  . Alcohol use No     Allergies   Imitrex [sumatriptan]   Review of Systems Review of Systems  All other systems reviewed and are negative.    Physical Exam Updated Vital Signs BP (!) 184/112 (BP Location: Left Arm)   Pulse 74   Resp 15   SpO2 100%   Physical Exam  Constitutional: She is oriented to person, place, and time. She appears well-developed and well-nourished.  Non-toxic appearance. No distress.  HENT:  Head: Normocephalic and atraumatic.  Eyes: Pupils are equal, round, and reactive to light. Conjunctivae, EOM and lids are normal.  Neck: Normal range of motion. Neck supple. No tracheal deviation present. No thyroid mass present.  Cardiovascular: Normal rate, regular rhythm and normal heart sounds.  Exam reveals no gallop.   No murmur  heard. Pulmonary/Chest: Effort normal and breath sounds normal. No stridor. No respiratory distress. She has no decreased breath sounds. She has no wheezes. She has no rhonchi. She has  no rales.  Abdominal: Soft. Normal appearance and bowel sounds are normal. She exhibits no distension. There is no tenderness. There is no rebound and no CVA tenderness.  Musculoskeletal: Normal range of motion. She exhibits no edema or tenderness.  Neurological: She is alert and oriented to person, place, and time. She has normal strength. No cranial nerve deficit or sensory deficit. GCS eye subscore is 4. GCS verbal subscore is 5. GCS motor subscore is 6.  Skin: Skin is warm and dry. No abrasion and no rash noted.  Psychiatric: She has a normal mood and affect. Her speech is normal and behavior is normal.  Nursing note and vitals reviewed.    ED Treatments / Results  Labs (all labs ordered are listed, but only abnormal results are displayed) Labs Reviewed  BASIC METABOLIC PANEL  CBC  BRAIN NATRIURETIC PEPTIDE  I-STAT TROPONIN, ED    EKG  EKG Interpretation  Date/Time:  Monday February 01 2017 09:17:06 EDT Ventricular Rate:  70 PR Interval:    QRS Duration: 95 QT Interval:  418 QTC Calculation: 451 R Axis:   -21 Text Interpretation:  Sinus rhythm Abnormal R-wave progression, late transition Left ventricular hypertrophy Baseline wander in lead(s) V1 V3 No significant change since last tracing Confirmed by Lorre Nick ([id number]) on 02/01/2017 11:54:58 AM       Radiology Dg Chest 2 View  Result Date: 02/01/2017 CLINICAL DATA:  Chest pain. EXAM: CHEST  2 VIEW COMPARISON:  Chest x-ray dated December 14, 2013. FINDINGS: The cardiomediastinal silhouette is normal in size. Normal pulmonary vascularity. No focal consolidation, pleural effusion, or pneumothorax. No acute osseous abnormality. Spinal cord stimulator leads noted at T7-T8. Cholecystectomy. IMPRESSION: No active cardiopulmonary disease. Electronically  Signed   By: Obie Dredge M.D.   On: 02/01/2017 09:41    Procedures Procedures (including critical care time)  Medications Ordered in ED Medications - No data to display   Initial Impression / Assessment and Plan / ED Course  I have reviewed the triage vital signs and the nursing notes.  Pertinent labs & imaging results that were available during my care of the patient were reviewed by me and considered in my medical decision making (see chart for details).     patient's EKG without acute changes. D-dimer negative. BNP negative as well 2. Chest x-ray without infiltrate.troponin is also negative. Patient states her symptoms have been lasting for approximately 1 minute have been sharp in nature. Upon further questioning, no associated exertional chest pain or anginal qualities. Will have patient follow-up with cardiology.  Final Clinical Impressions(s) / ED Diagnoses   Final diagnoses:  None    New Prescriptions New Prescriptions   No medications on file     Lorre Nick, MD 02/01/17 1421    Lorre Nick, MD 02/01/17 1428

## 2017-02-02 ENCOUNTER — Telehealth (HOSPITAL_COMMUNITY): Payer: Self-pay | Admitting: *Deleted

## 2017-02-02 NOTE — Telephone Encounter (Signed)
Patient called triage line yesterday saying she was in the ER and was told she needed to follow up in clinic.  Message sent to Lorraine Lax with Scheduling to call patient.

## 2017-02-11 ENCOUNTER — Encounter (HOSPITAL_COMMUNITY): Payer: Medicare HMO

## 2017-02-13 ENCOUNTER — Other Ambulatory Visit: Payer: Self-pay | Admitting: Nurse Practitioner

## 2017-02-19 ENCOUNTER — Encounter: Payer: Medicare HMO | Admitting: Hematology

## 2017-02-19 ENCOUNTER — Other Ambulatory Visit: Payer: Medicare HMO

## 2017-02-20 NOTE — Progress Notes (Signed)
No show  This encounter was created in error - please disregard.

## 2017-02-22 ENCOUNTER — Telehealth: Payer: Self-pay | Admitting: Hematology

## 2017-02-22 NOTE — Telephone Encounter (Signed)
Patient forgot about appt on 9/7. Got that rescheduled for a month out per 9/8 sch msg. Sending her a confirmation letter as well.

## 2017-03-19 ENCOUNTER — Other Ambulatory Visit (HOSPITAL_COMMUNITY): Payer: Self-pay | Admitting: Cardiology

## 2017-03-19 DIAGNOSIS — D649 Anemia, unspecified: Secondary | ICD-10-CM

## 2017-03-19 DIAGNOSIS — I5022 Chronic systolic (congestive) heart failure: Secondary | ICD-10-CM

## 2017-03-19 DIAGNOSIS — R531 Weakness: Secondary | ICD-10-CM

## 2017-03-23 NOTE — Progress Notes (Signed)
[facility] HEMATOLOGY OFFICE PROGRESS NOTE Date of visit: 03/24/2017   Linda Fear, NP [address] [place] [address]  DIAGNOSIS: Iron deficiency anemia due to chronic blood loss  Anxiety disorder, unspecified type - Plan: Ambulatory Referral to Pastoral Care  CHIEF COMPLAIN: follow up   CURRENT TREATMENT:  Feraheme 1,020 mg given  On 10/12/13 and 03/20/2014, 510mg  08/29/14  INTERVAL HISTORY:  RUCHOMA FURY 46 y.o. female with a history of IDA secondary to heavy menses and also a history of gastric bypass is here for follow up. She was last seen here 01/07/2016. She has increased fatigue from her baseline but continues to perform activities in and outside of the home. She has had trouble sleeping lately. She has noted chest pain that began 2-1/2 months ago that occurs bilaterally to her upper chest. The pain is constant occasionally a sharp or "fluttering" sensation. She presented to the ED on 02/01/2017, her workup was negative. She will follow-up with her cardiologist next month. She denies cough or dyspnea. She has occasional vaginal spotting since endometrial ablation in 2015. She reports 6 or 7 family members have died recently, she is having trouble sleeping, and feels depressed.    MEDICAL HISTORY: Past Medical History:  Diagnosis Date  . Anemia    hx of iron transfusions - last one 10/2013   . Anxiety   . Arthritis    neck and shoulders   . Asthma   . Back pain, chronic   . Bipolar disorder (HCC)   . Carpal tunnel syndrome on both sides   . Cataract   . Depression   . Diabetes mellitus   . Fibromyalgia   . GERD (gastroesophageal reflux disease)   . History of gastric bypass Jan 2013  . Hyperlipidemia   . Hypertension   . Insomnia   . Migraines   . Nervous breakdown sept 2013  . Neuropathy   . Nonischemic cardiomyopathy (HCC)   . OSA (obstructive sleep apnea) 02/21/2015  . Peripheral vascular disease (HCC)   . Shortness of breath    with  exertion   . Sleep apnea    borderline- no cpap yet  . Systolic CHF, acute on chronic (HCC)    CARDIOLOGIST-- DR Aris Lot NELSON  ([facility])  . Wound of left breast     INTERIM HISTORY: has Morbid obesity (HCC); IDDM (insulin dependent diabetes mellitus) (HCC); Degenerative disc disease; Nausea and vomiting; Major depressive disorder, recurrent episode, moderate (HCC); Compulsive behavior disorder (HCC); History of gastric bypass; Anxiety disorder; Nervous breakdown; Hematochezia; Iron deficiency anemia due to chronic blood loss; DM type 2 (diabetes mellitus, type 2) (HCC); Generalized weakness; Generalized abdominal pain; Hyperactive bowel sounds; Dehydration; Insomnia; Diabetic neuropathy (HCC); Migraine, unspecified, without mention of intractable migraine without mention of status migrainosus; History of iron deficiency anemia; Chronic systolic heart failure (HCC); Left breast abscess; Breast injury; Open breast wound; Chronic systolic CHF (congestive heart failure) (HCC); Goiter; Fatigue; Excessive daytime sleepiness; Snoring; OSA (obstructive sleep apnea); and Elevated LFTs on her problem list.    ALLERGIES:  is allergic to imitrex [sumatriptan].  MEDICATIONS: has a current medication list which includes the following prescription(s): alum & mag hydroxide-simeth, biotin, bupropion, carvedilol, vitamin d3, cyclobenzaprine, diclofenac sodium, duloxetine, fluticasone, ibuprofen, insulin glargine, insulin glulisine, ivabradine, lidocaine, liraglutide, lubiprostone, oxycodone-acetaminophen, polyethylene glycol, potassium chloride sa, pregabalin, ranitidine, sacubitril-valsartan, spironolactone, torsemide, and ventolin hfa.  SURGICAL HISTORY:  Past Surgical History:  Procedure Laterality Date  . BACK SURGERY  November 1992  .  BREAST SURGERY Left    I&D for abcsess   . CARDIAC CATHETERIZATION  11-14-2013  DR Marca Ancona   NORMAL LV FILLING PRESSURE W/ MILD ELEVATED RV FILLING, POSSIBLY  SUFFESTING RV FAILURE OUT OF PROPORTION TO LV FAILURE. EF ABOUT 35% WITH DIFFUSE HYPOKINESIS. NO ANGIOGRAPHIC CAD, NONISCHEMIC CARDIOMYOPATHY  . CESAREAN SECTION  December 1998  . CHOLECYSTECTOMY  1998  . DILITATION & CURRETTAGE/HYSTROSCOPY WITH NOVASURE ABLATION N/A 02/26/2014   Procedure: DILATATION & CURETTAGE/HYSTEROSCOPY WITH NOVASURE ABLATION;  Surgeon: Meriel Pica, MD;  Location: [facility];  Service: Gynecology;  Laterality: N/A;  . ESOPHAGOGASTRODUODENOSCOPY  07/27/2011   Procedure: ESOPHAGOGASTRODUODENOSCOPY (EGD);  Surgeon: Freddy Jaksch, MD;  Location: [facility];  Service: Endoscopy;  Laterality: N/A;  . GASTRIC ROUX-EN-Y  06/30/2011   Procedure: LAPAROSCOPIC ROUX-EN-Y GASTRIC;  Surgeon: Mariella Saa, MD;  Location: [facility];  Service: General;  Laterality: N/A;     . INCISION AND DRAINAGE OF WOUND Left 12/14/2013   Procedure: IRRIGATION AND DEBRIDEMENT OF LEFT BREAST WOUND WITH PLACEMENT OF A-CELL, WITH CLOSURE ;  Surgeon: Wayland Denis, DO;  Location: [facility];  Service: Plastics;  Laterality: Left;  . IRRIGATION AND DEBRIDEMENT ABSCESS Left 11/26/2013   Procedure: IRRIGATION AND DEBRIDEMENT ABSCESS;  Surgeon: Velora Heckler, MD;  Location: [facility];  Service: General;  Laterality: Left;  . LEFT AND RIGHT HEART CATHETERIZATION WITH CORONARY ANGIOGRAM N/A 11/14/2013   Procedure: LEFT AND RIGHT HEART CATHETERIZATION WITH CORONARY ANGIOGRAM;  Surgeon: Laurey Morale, MD;  Location: [facility];  Service: Cardiovascular;  Laterality: N/A;  . SPINAL CORD STIMULATOR INSERTION N/A 03/09/2014   Procedure: LUMBAR SPINAL CORD STIMULATOR INSERTION;  Surgeon: Gwynne Edinger, MD;  Location: [facility];  Service: Neurosurgery;  Laterality: N/A;  . TUBAL LIGATION  January 1999    REVIEW OF SYSTEMS:   Constitutional: Denies fevers, chills or abnormal weight loss;(+) fatigue, mildly increased over baseline Eyes: Denies blurriness of vision Ears, nose, mouth, throat, and face: Denies mucositis  or sore throat Respiratory: Denies cough, dyspnea or wheezes Cardiovascular: Denies palpitation,  or lower extremity swelling (+)chest discomfort, constant, "flutter" and sharpe Gastrointestinal:  Denies nausea, vomiting, constipation, diarrhea, heartburn or change in bowel habits. Denies blood in stool.  GU: (+) once monthly vaginal spotting since endometrial ablation Skin: Denies abnormal skin rashes Lymphatics: Denies new lymphadenopathy or easy bruising Neurological:Denies numbness, tingling or new weaknesses (+) chronic headaches Behavioral/Psych: Mood is stable, no new changes  All other systems were reviewed with the patient and are negative.  PHYSICAL EXAMINATION: ECOG PERFORMANCE STATUS: 1 BP (!) 155/87 (BP Location: Left Arm, Patient Position: Sitting) Comment: nurse aware  Pulse 80   Temp 98.4 F (36.9 C) (Oral)   Resp 18   Ht  (1.651 m)   Wt 194 lb 6.4 oz (88.2 kg)   SpO2 100%   BMI 32.35 kg/m    GENERAL:alert, no distress and comfortable; moderately obese SKIN: skin color, texture, turgor are normal, no rashes or significant lesions; multiple scarred lesion on lower extremity.  EYES: normal, Conjunctiva are pink and non-injected, sclera clear OROPHARYNX:no exudate, no erythema and lips, buccal mucosa, and tongue normal  NECK: supple, thyroid normal size, non-tender, without nodularity LYMPH:  no palpable cervical or supraclavicular lymphadenopathy  BREASTS: No examined.  LUNGS: clear to auscultation bilaterally with breathing effort, no wheezes or rhonchi HEART: regular rate & rhythm and no murmurs and no lower extremity edema ABDOMEN:abdomen soft, non-tender and normal bowel sounds Musculoskeletal:no cyanosis of  digits and no clubbing  NEURO: alert & oriented x 3 with fluent speech, no focal motor/sensory deficits  Labs:   CBC Latest Ref Rng & Units 03/24/2017 02/01/2017 06/23/2016  WBC 3.9 - 10.3 10e3/uL 8.0 7.8 10.0  Hemoglobin 11.6 - 15.9 g/dL 11.5(L) 13.4  12.2  Hematocrit 34.8 - 46.6 % 34.5(L) 39.2 35.7  Platelets 145 - 400 10e3/uL 235 327 295   CMP Latest Ref Rng & Units 02/01/2017 12/28/2016 05/13/2016  Glucose 65 - 99 mg/dL 161(W) 960(A) -  BUN 6 - 20 mg/dL 9 11 -  Creatinine 5.40 - 1.00 mg/dL 9.81 1.91 -  Sodium 478 - 145 mmol/L 142 137 -  Potassium 3.5 - 5.1 mmol/L 3.0(L) 3.3(L) -  Chloride 101 - 111 mmol/L 105 110 -  CO2 22 - 32 mmol/L 30 18(L) -  Calcium 8.9 - 10.3 mg/dL 2.9(F) 8.3(L) -  Total Protein 6.5 - 8.1 g/dL - 6.6 7.2  Total Bilirubin 0.3 - 1.2 mg/dL - 0.6 0.5  Alkaline Phos 38 - 126 U/L - 213(H) 146(H)  AST 15 - 41 U/L - 37 23  ALT 14 - 54 U/L - 100(H) 19   PROCEDURES  Upper Endoscopy 03/13/16 IMPRESSION: - Normal esophagus. - Gastric bypass with a normal-sized pouch. Gastrojejunal anastomosis characterized by healthy appearing mucosa. - Normal examined jejunum. - Normal larynx. - No specimens collected. Vomiting appears to be due to gastric dysmotility from diabetes and (less so) narcotic pain medicine with a component of post-GBP dietary needs.   RADIOGRAPHIC STUDIES: None   ASSESSMENT: [name] 46 y.o. female with a history of Iron deficiency anemia due to chronic blood loss  Anxiety disorder, unspecified type - Plan: Ambulatory Referral to Pastoral Care   1.  H.o. severe IDA secondary to gastric bypass and menorrhagia   --Previously, her iron was consistent with iron deficiency, secondary to gastric bypass and menorrhagia. -She has had endometrial ablation in August 2015, no menstrual period since then. Her iron deficient anemia has significantly improved afterwards. -She did not respond well to oral iron, and does not want try it anymore. Received IV feraheme 10/12/2013, 03/20/2014, 08/29/2014 -Her ferritin and iron level last check was normal in 06/2016.  -Her Hgb dropped slightly, 11.5 today; 13.4 one month ago, she has increased fatigue -I anticipate she will need IV iron in the near future,  ferritin and Iron study pending today -if low, I will arrange for infusion -she will return for labs in 2,4, and 6 months; f/u in 6 months   2. Fatigue and drowsiness -Her TSH and sleep study were normal. -If her iron level was low, we'll replace with IV iron, which may also contribute to her fatigue -She will continue follow-up with her primary care physician -she is having difficulty sleeping, her PCP prescribed Buspar for anxiety; she doesn't think this medication is working -she will contact her PCP  3. DM2, CHF and nonischemic CHF  -- Continue management per PCP, her endocrinologist and cardiologist    -she went to ED on 02/01/17 for chest pain, work up was negative -I do not think this is related to her anemia -she will f/u with cardiologist in 04/2017  4. Transaminitis  -She still has elevated liver enzymes. Recent ultrasound was negative except for hepatic steatosis -She is scheduled to see GI, underwent Upper Endoscopy on 03/13/2016  Follow up.  -I will call her with iron study results; if it's low, will set up IV feraheme -Labs in 2, 4, and 6  months -f/u in 6 months    All questions were answered. The patient knows to call the clinic with any problems, questions or concerns. We can certainly see the patient much sooner if necessary.  I spent 15 minutes counseling the patient face to face. The total time spent in the appointment was 20 minutes.  Santiago Glad, AGNP-C 03/24/2017  This document serves as a record of services personally performed by Malachy Mood, MD. It was created on her behalf by Delphina Cahill, a trained medical scribe. The creation of this record is based on the scribe's personal observations and the provider's statements to them. This document has been checked and approved by the attending provider.

## 2017-03-24 ENCOUNTER — Telehealth: Payer: Self-pay | Admitting: Hematology

## 2017-03-24 ENCOUNTER — Other Ambulatory Visit (HOSPITAL_BASED_OUTPATIENT_CLINIC_OR_DEPARTMENT_OTHER): Payer: Medicare HMO

## 2017-03-24 ENCOUNTER — Encounter: Payer: Self-pay | Admitting: Hematology

## 2017-03-24 ENCOUNTER — Ambulatory Visit (HOSPITAL_BASED_OUTPATIENT_CLINIC_OR_DEPARTMENT_OTHER): Payer: Medicare HMO | Admitting: Hematology

## 2017-03-24 VITALS — BP 155/87 | HR 80 | Temp 98.4°F | Resp 18 | Ht 65.0 in | Wt 194.4 lb

## 2017-03-24 DIAGNOSIS — Z9884 Bariatric surgery status: Secondary | ICD-10-CM | POA: Diagnosis not present

## 2017-03-24 DIAGNOSIS — R740 Nonspecific elevation of levels of transaminase and lactic acid dehydrogenase [LDH]: Secondary | ICD-10-CM

## 2017-03-24 DIAGNOSIS — I509 Heart failure, unspecified: Secondary | ICD-10-CM

## 2017-03-24 DIAGNOSIS — E119 Type 2 diabetes mellitus without complications: Secondary | ICD-10-CM

## 2017-03-24 DIAGNOSIS — R400 Somnolence: Secondary | ICD-10-CM | POA: Diagnosis not present

## 2017-03-24 DIAGNOSIS — F419 Anxiety disorder, unspecified: Secondary | ICD-10-CM

## 2017-03-24 DIAGNOSIS — N920 Excessive and frequent menstruation with regular cycle: Secondary | ICD-10-CM

## 2017-03-24 DIAGNOSIS — R5383 Other fatigue: Secondary | ICD-10-CM

## 2017-03-24 DIAGNOSIS — D500 Iron deficiency anemia secondary to blood loss (chronic): Secondary | ICD-10-CM | POA: Diagnosis not present

## 2017-03-24 LAB — IRON AND TIBC
%SAT: 28 % (ref 21–57)
IRON: 79 ug/dL (ref 41–142)
TIBC: 281 ug/dL (ref 236–444)
UIBC: 202 ug/dL (ref 120–384)

## 2017-03-24 LAB — CBC WITH DIFFERENTIAL/PLATELET
BASO%: 0.2 % (ref 0.0–2.0)
BASOS ABS: 0 10*3/uL (ref 0.0–0.1)
EOS ABS: 0.2 10*3/uL (ref 0.0–0.5)
EOS%: 2.2 % (ref 0.0–7.0)
HCT: 34.5 % — ABNORMAL LOW (ref 34.8–46.6)
HGB: 11.5 g/dL — ABNORMAL LOW (ref 11.6–15.9)
LYMPH%: 22.7 % (ref 14.0–49.7)
MCH: 30 pg (ref 25.1–34.0)
MCHC: 33.3 g/dL (ref 31.5–36.0)
MCV: 90.1 fL (ref 79.5–101.0)
MONO#: 0.7 10*3/uL (ref 0.1–0.9)
MONO%: 8.5 % (ref 0.0–14.0)
NEUT%: 66.4 % (ref 38.4–76.8)
NEUTROS ABS: 5.3 10*3/uL (ref 1.5–6.5)
Platelets: 235 10*3/uL (ref 145–400)
RBC: 3.83 10*6/uL (ref 3.70–5.45)
RDW: 12.9 % (ref 11.2–14.5)
WBC: 8 10*3/uL (ref 3.9–10.3)
lymph#: 1.8 10*3/uL (ref 0.9–3.3)

## 2017-03-24 LAB — FERRITIN: Ferritin: 83 ng/ml (ref 9–269)

## 2017-03-24 NOTE — Telephone Encounter (Signed)
Scheduled appt per 10/10 los - Gave patient AVS and calender per los.  

## 2017-03-25 ENCOUNTER — Telehealth: Payer: Self-pay | Admitting: Nurse Practitioner

## 2017-03-25 NOTE — Telephone Encounter (Signed)
I called patient to inform her of lab results, iron study is normal and she does not require IV feraheme at this time. I reviewed her lower hemaglobin and fatigue are likely related to her chronic diseases such as DM and CHF and that she should continue f/u with cardiology and PCP. I informed her she can call sooner if she develops new or worsening concerns, otherwise she will return in 2 months for lab draw. She verbalizes understanding and thanks for the call.

## 2017-03-31 ENCOUNTER — Encounter: Payer: Self-pay | Admitting: General Practice

## 2017-03-31 NOTE — Progress Notes (Signed)
[facility] Spiritual Care Note  [name] by phone per referral from [name]/NP for emotional support. [name] was very receptive to call, sharing some background information about her personal/health situation.  We scheduled a phone appt for Monday 10/22 at 11:00am to speak in more detail.   [address] [place], [place], [facility] Pager [phone] Voicemail [phone]

## 2017-04-05 ENCOUNTER — Encounter: Payer: Self-pay | Admitting: General Practice

## 2017-04-05 NOTE — Progress Notes (Signed)
[facility] Spiritual Care Note  Taquesha and I both had scheduling problems today, so I LVM suggesting alternate times to meet in the next two weeks.   [address] [place], [place], [facility] Pager [phone] Voicemail [phone]

## 2017-04-19 ENCOUNTER — Ambulatory Visit (HOSPITAL_COMMUNITY)
Admission: RE | Admit: 2017-04-19 | Discharge: 2017-04-19 | Disposition: A | Discharge status: Home / Self Care | Payer: Medicare HMO | Source: Ambulatory Visit | Attending: Cardiology | Admitting: Cardiology

## 2017-04-19 VITALS — BP 162/80 | HR 76 | Wt 188.4 lb

## 2017-04-19 DIAGNOSIS — G4733 Obstructive sleep apnea (adult) (pediatric): Secondary | ICD-10-CM | POA: Diagnosis not present

## 2017-04-19 DIAGNOSIS — E785 Hyperlipidemia, unspecified: Secondary | ICD-10-CM | POA: Insufficient documentation

## 2017-04-19 DIAGNOSIS — I5031 Acute diastolic (congestive) heart failure: Secondary | ICD-10-CM

## 2017-04-19 DIAGNOSIS — R0789 Other chest pain: Secondary | ICD-10-CM | POA: Diagnosis not present

## 2017-04-19 DIAGNOSIS — E669 Obesity, unspecified: Secondary | ICD-10-CM | POA: Diagnosis not present

## 2017-04-19 DIAGNOSIS — R740 Nonspecific elevation of levels of transaminase and lactic acid dehydrogenase [LDH]: Secondary | ICD-10-CM | POA: Insufficient documentation

## 2017-04-19 DIAGNOSIS — Z8249 Family history of ischemic heart disease and other diseases of the circulatory system: Secondary | ICD-10-CM | POA: Insufficient documentation

## 2017-04-19 DIAGNOSIS — Z6831 Body mass index (BMI) 31.0-31.9, adult: Secondary | ICD-10-CM | POA: Diagnosis not present

## 2017-04-19 DIAGNOSIS — Z794 Long term (current) use of insulin: Secondary | ICD-10-CM | POA: Diagnosis not present

## 2017-04-19 DIAGNOSIS — M797 Fibromyalgia: Secondary | ICD-10-CM | POA: Insufficient documentation

## 2017-04-19 DIAGNOSIS — Z9689 Presence of other specified functional implants: Secondary | ICD-10-CM | POA: Diagnosis not present

## 2017-04-19 DIAGNOSIS — F419 Anxiety disorder, unspecified: Secondary | ICD-10-CM | POA: Diagnosis not present

## 2017-04-19 DIAGNOSIS — Z9884 Bariatric surgery status: Secondary | ICD-10-CM | POA: Insufficient documentation

## 2017-04-19 DIAGNOSIS — I428 Other cardiomyopathies: Secondary | ICD-10-CM | POA: Insufficient documentation

## 2017-04-19 DIAGNOSIS — R945 Abnormal results of liver function studies: Secondary | ICD-10-CM

## 2017-04-19 DIAGNOSIS — E049 Nontoxic goiter, unspecified: Secondary | ICD-10-CM | POA: Diagnosis not present

## 2017-04-19 DIAGNOSIS — I110 Hypertensive heart disease with heart failure: Secondary | ICD-10-CM | POA: Insufficient documentation

## 2017-04-19 DIAGNOSIS — R10811 Right upper quadrant abdominal tenderness: Secondary | ICD-10-CM | POA: Diagnosis not present

## 2017-04-19 DIAGNOSIS — M961 Postlaminectomy syndrome, not elsewhere classified: Secondary | ICD-10-CM | POA: Diagnosis not present

## 2017-04-19 DIAGNOSIS — E119 Type 2 diabetes mellitus without complications: Secondary | ICD-10-CM | POA: Insufficient documentation

## 2017-04-19 DIAGNOSIS — M5416 Radiculopathy, lumbar region: Secondary | ICD-10-CM | POA: Diagnosis not present

## 2017-04-19 DIAGNOSIS — M545 Low back pain: Secondary | ICD-10-CM | POA: Insufficient documentation

## 2017-04-19 DIAGNOSIS — Z79899 Other long term (current) drug therapy: Secondary | ICD-10-CM | POA: Diagnosis not present

## 2017-04-19 DIAGNOSIS — F319 Bipolar disorder, unspecified: Secondary | ICD-10-CM | POA: Diagnosis not present

## 2017-04-19 DIAGNOSIS — R7989 Other specified abnormal findings of blood chemistry: Secondary | ICD-10-CM

## 2017-04-19 DIAGNOSIS — I5022 Chronic systolic (congestive) heart failure: Secondary | ICD-10-CM | POA: Diagnosis not present

## 2017-04-19 DIAGNOSIS — Z7951 Long term (current) use of inhaled steroids: Secondary | ICD-10-CM | POA: Diagnosis not present

## 2017-04-19 DIAGNOSIS — I10 Essential (primary) hypertension: Secondary | ICD-10-CM | POA: Diagnosis not present

## 2017-04-19 LAB — COMPREHENSIVE METABOLIC PANEL
ALT: 85 U/L — AB (ref 14–54)
ANION GAP: 8 (ref 5–15)
AST: 225 U/L — ABNORMAL HIGH (ref 15–41)
Albumin: 3.4 g/dL — ABNORMAL LOW (ref 3.5–5.0)
Alkaline Phosphatase: 220 U/L — ABNORMAL HIGH (ref 38–126)
BUN: 12 mg/dL (ref 6–20)
CHLORIDE: 111 mmol/L (ref 101–111)
CO2: 22 mmol/L (ref 22–32)
Calcium: 8.5 mg/dL — ABNORMAL LOW (ref 8.9–10.3)
Creatinine, Ser: 0.77 mg/dL (ref 0.44–1.00)
GFR calc non Af Amer: >60 mL/min (ref >60)
Glucose, Bld: 107 mg/dL — ABNORMAL HIGH (ref 65–99)
Potassium: 3.2 mmol/L — ABNORMAL LOW (ref 3.5–5.1)
SODIUM: 141 mmol/L (ref 135–145)
Total Bilirubin: 0.9 mg/dL (ref 0.3–1.2)
Total Protein: 6.8 g/dL (ref 6.5–8.1)

## 2017-04-19 LAB — BRAIN NATRIURETIC PEPTIDE: B Natriuretic Peptide: 40.4 pg/mL (ref 0.0–100.0)

## 2017-04-19 MED ORDER — POTASSIUM CHLORIDE ER 10 MEQ PO TBCR: 50.0000 meq | EXTENDED_RELEASE_TABLET | Freq: Every day | ORAL | 3 refills | Status: DC

## 2017-04-19 MED ORDER — CARVEDILOL 12.5 MG PO TABS: 18.7500 mg | ORAL_TABLET | Freq: Two times a day (BID) | ORAL | 11 refills | Status: DC

## 2017-04-19 NOTE — Patient Instructions (Signed)
Increase Carvedilol 18.75 mg (1.5) tabs, twice a day  Take Torsemide 60 mg (3 tabs) for daily three days, then go back to taking 40 mg (2 tabs) daily   You have been referred to GI Dr. Earlene Plater  Your physician has requested that you have cardiac CT. Cardiac computed tomography (CT) is a painless test that uses an x-ray machine to take clear, detailed pictures of your heart. For further information please visit [email or web address] Please follow instruction sheet as given.   Labs drawn today (if we do not call you, then your lab work was stable)   Your physician recommends that you schedule a follow-up appointment in: 1 month

## 2017-04-19 NOTE — Progress Notes (Signed)
Patient ID: [name], female   DOB: [birth year], 46 y.o.   MRN: [id number] PCP: Dr. Larita Fife Cardiology: Dr. Shirlee Latch  46 yo with history of nonischemic cardiomyopathy . Cardiomyopathy was diagnosed back in 5/15.  Echo at that time showed EF 20-25%.  Her sister, of note, had a heart transplantation.  She was taken for RHC/LHC showed preserved cardiac output an no angiographic CAD.  CPX showed a mild to moderate functional limitation for her age.  Echo 1/16 improved with EF 55-60%, echo in 8/16 showed EF stable at 55%.  Echo in 7/18 with EF back down to 45-50%.    She returns for followup today.  Having more chest pain recently, it can last a few hours to all day.  Was evaluated once in the ER and sent home with negative workup. Not exertional.  No palpitations.  She is short of breath walking up hills and up stairs.  No dyspnea walking on flat ground.  She has orthopnea and sleeps on 4 pillows but this has been chronic x years.  BP is high today though she says that it does not seem to run high when she checks at home (but checks rarely).  Weight down 1 lb.   REDS vest reading 35%  ECG (personally reviewed): NSR, LVH  Labs (10/15): BNP 52, ferritin normal Labs (11/15): K 4.3, creatinine 1.2, SPEP negative, TSH normal Labs (06/05/14): K 4.5 Creatinine 1.6 metolazone was stopped.  Labs (08/24/14): K 4.0, creatinine 1.1, TSH normal, LDL 104 Labs (1/17): K 3.8, creatinine 1.0, AST 214, ALT 142 Labs (5/17): AST 254, ALT 68 Labs (10/17): K 3.1, creatinine 0.76 Labs (1/18): hgb 12.1 Labs (7/18): TSH normal, ALT 100, AST 37, alkaline phosphatase 213 Labs (8/18): K 3, creatinine 0.71, BNP 20 Labs (10/18): hgb 11.5  PMH: 1. Nonischemic cardiomyopathy: Echo (5/15) with EF 20-25%, diffuse hypokinesis, grade II diastolic dysfunction, normal RV size and systolic function.  LHC/RHC (6/15) with no angiographic CAD; mean RA 11, PA 41/16 (mean 27), mean PCWP 15, CI 3.36, PVR 1.8.  CPX (8/15) with RER 1.14,  peak VO2 13.7 (63% predicted), peak VO2 20.2 when adjusted for ideal body weight, VE/VCO2 slope 31 => mild to moderate functional limitation.  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 No history of ETOH or drug abuse.  SPEP and TSH negative, ferritin normal.  Echo 07/10/2014 with EF 55-60% Grade I DD.  Echo (8/16) with EF 55%, normal RV size and systolic function.  - Echo (7/18): EF 45-50%, grade II diastolic dysfunction.  2. Type II diabetes 3. HTN 4. Hyperlipidemia 5. Low back pain 6. Menorrhagia: s/p uterine ablation.  7. Bipolar disorder 8. Fibromyalgia 9. Migraines 10. Obesity: s/p gastric bypass.  11. Sleep study 6/16 with minimal OSA.  12. Anxiety 13. Elevated transaminases: Abdominal US with fatty liver.  AMA, ASMA, HCV, HBsAg negative.  14. Anemia  SH: Lives with mother in [place].  No smoking, ETOH, or drugs.   FH: Sister had heart transplant.  Uncle and grandfather with CHF.   ROS: All systems reviewed and negative except as per HPI.   Current Outpatient Medications  Medication Sig Dispense Refill  . Biotin 5000 MCG TABS Take 5,000 mcg by mouth daily.    Marland Kitchen buPROPion (WELLBUTRIN XL) 300 MG 24 hr tablet Take 300 mg by mouth daily before breakfast.     . busPIRone (BUSPAR) 15 MG tablet Take 15 mg by mouth 3 (three) times daily.    . carvedilol (COREG) 12.5 MG tablet Take 1.5 tablets (  18.75 mg total) 2 (two) times daily with a meal by mouth. 60 tablet 11  . Cholecalciferol (VITAMIN D3) 5000 units TABS Take 5,000 Units by mouth daily.     . CORLANOR 5 MG TABS tablet TAKE 1 TABLET BY MOUTH 2 (TWO) TIMES DAILY WITH A MEAL. 60 tablet 11  . cyclobenzaprine (FLEXERIL) 10 MG tablet Take 10 mg by mouth 3 (three) times daily as needed for muscle spasms.     . diclofenac sodium (VOLTAREN) 1 % GEL Apply 2 g topically 2 (two) times daily.    . DULoxetine (CYMBALTA) 60 MG capsule Take 60 mg by mouth daily.    . fluticasone (FLONASE) 50 MCG/ACT nasal spray Place 1 spray into both nostrils daily.      Marland Kitchen. ibuprofen (ADVIL,MOTRIN) 800 MG tablet Take 800 mg by mouth every 8 (eight) hours as needed (pain).     . Insulin Glargine (LANTUS SOLOSTAR) 100 UNIT/ML Solostar Pen Inject 30 Units into the skin daily.     Marland Kitchen. lidocaine (LIDODERM) 5 % Place 1 patch onto the skin daily as needed (for back pain).     . lubiprostone (AMITIZA) 24 MCG capsule Take 24 mcg by mouth 2 (two) times daily as needed for constipation.    Marland Kitchen. oxyCODONE-acetaminophen (PERCOCET) 10-325 MG per tablet Take 0.5-1 tablets by mouth every 4 (four) hours as needed for pain.    . polyethylene glycol (MIRALAX / GLYCOLAX) packet Take 17 g by mouth 3 (three) times a week.    . pregabalin (LYRICA) 150 MG capsule Take 150 mg by mouth 3 (three) times daily.    . ranitidine (ZANTAC) 150 MG tablet Take 150 mg by mouth daily.    . sacubitril-valsartan (ENTRESTO) 97-103 MG TAKE 1 TABLET BY MOUTH 2 (TWO) TIMES DAILY. 60 tablet 6  . spironolactone (ALDACTONE) 25 MG tablet Take 1 tablet (25 mg total) by mouth daily. 90 tablet 3  . torsemide (DEMADEX) 20 MG tablet Take 2 tablets (40 mg total) by mouth daily. 60 tablet 6  . VENTOLIN HFA 108 (90 BASE) MCG/ACT inhaler INHALE 2 PUFFS BY MOUTH EVERY 4 TO 6 HOURS AS NEEDED FOR FOR SHORTNESS OF BREATH OR WHEEZING 8.5 Inhaler 3  . VICTOZA 18 MG/3ML SOPN Inject 1.8 mg into the skin daily.    . potassium chloride (K-DUR) 10 MEQ tablet Take 5 tablets (50 mEq total) daily by mouth. 350 tablet 3   Current Facility-Administered Medications  Medication Dose Route Frequency Provider Last Rate Last Dose  . 0.9 %  sodium chloride infusion  500 mL Intravenous Continuous Danis, Starr LakeHenry L III, MD       BP (!) 162/80   Pulse 76   Wt 188 lb 6.4 oz (85.5 kg)   SpO2 96%   BMI 31.35 kg/m  General: NAD Neck: JVP 8 cm with HJR.  Goiter noted.  Lungs: Clear to auscultation bilaterally with normal respiratory effort. CV: Nondisplaced PMI.  Heart regular S1/S2, no S3/S4, 1/6 SEM RUSB.  No peripheral edema.  No carotid  bruit.  Normal pedal pulses.  Abdomen: Soft, RUQ mildly tender, no hepatosplenomegaly, no distention.  Skin: Intact without lesions or rashes.  Neurologic: Alert and oriented x 3.  Psych: Normal affect. Extremities: No clubbing or cyanosis.  HEENT: Normal.   Assessment/Plan: 1. Chronic systolic CHF: Nonischemic cardiomyopathy.  Possibly familial (sister had heart transplant).  NYHA II-III symptoms.   CPX showed mild to moderate functional limitation in 8/15.  Echo in 1/16 showed recovery of EF to  55-60%.  Echo repeated in 8/16 showed EF 55%, normal RV size and systolic function.  Echo in 7/18 showed EF down a bit to 45-50%.  On exam, she seems mildly volume overloaded and REDS vest reading is borderline at 35%.  - Continue current Entresto, ivabradine, and spironolactone.  - With elevated BP, I will increase Coreg to 18.75 mg bid.  - Increase torsemide to 60 mg daily x 3 days then back to 40 mg daily.  Increase KCl to 50 daily.  2. Elevated transaminases: Chronic.  Abdominal US and serologies unrevealing.  Possible NASH. Mild RUQ tenderness.  - Check LFTs today.  - Due for GI followup, will arrange.  3. HTN: As above, increase Coreg.  4. Goiter: Recent TSH was normal. 5. Chest pain: Atypical but ongoing.  I will arrange for coronary CTA with FFR.    Followup 1 month.    Dalton McLean,MD 04/19/2017

## 2017-05-03 ENCOUNTER — Encounter (HOSPITAL_COMMUNITY): Payer: Self-pay | Admitting: *Deleted

## 2017-05-04 ENCOUNTER — Ambulatory Visit (HOSPITAL_COMMUNITY)
Admission: RE | Admit: 2017-05-04 | Discharge: 2017-05-04 | Disposition: A | Discharge status: Home / Self Care | Payer: Medicare HMO | Source: Ambulatory Visit | Attending: Cardiology | Admitting: Cardiology

## 2017-05-04 DIAGNOSIS — R945 Abnormal results of liver function studies: Secondary | ICD-10-CM | POA: Diagnosis not present

## 2017-05-04 DIAGNOSIS — I2510 Atherosclerotic heart disease of native coronary artery without angina pectoris: Secondary | ICD-10-CM | POA: Insufficient documentation

## 2017-05-04 DIAGNOSIS — R7989 Other specified abnormal findings of blood chemistry: Secondary | ICD-10-CM

## 2017-05-04 DIAGNOSIS — R079 Chest pain, unspecified: Secondary | ICD-10-CM | POA: Diagnosis not present

## 2017-05-04 MED ORDER — METOPROLOL TARTRATE 5 MG/5ML IV SOLN
5.0000 mg | INTRAVENOUS | Status: AC | PRN
  Administered 2017-05-04 (×3): 5 mg via INTRAVENOUS

## 2017-05-04 MED ORDER — METOPROLOL TARTRATE 5 MG/5ML IV SOLN
5.0000 mg | INTRAVENOUS | Status: AC | PRN
  Administered 2017-05-04: 5 mg via INTRAVENOUS
  Administered 2017-05-04: 10 mg via INTRAVENOUS

## 2017-05-04 MED ORDER — METOPROLOL TARTRATE 5 MG/5ML IV SOLN
INTRAVENOUS | Status: AC
  Filled 2017-05-04: qty 5

## 2017-05-04 MED ORDER — IOPAMIDOL (ISOVUE-370) INJECTION 76%
INTRAVENOUS | Status: AC
  Filled 2017-05-04: qty 100

## 2017-05-04 MED ORDER — NITROGLYCERIN 0.4 MG SL SUBL
SUBLINGUAL_TABLET | SUBLINGUAL | Status: AC
  Filled 2017-05-04: qty 1

## 2017-05-04 MED ORDER — METOPROLOL TARTRATE 5 MG/5ML IV SOLN
INTRAVENOUS | Status: AC
  Filled 2017-05-04: qty 15

## 2017-05-04 MED ORDER — NITROGLYCERIN 0.4 MG SL SUBL
0.4000 mg | SUBLINGUAL_TABLET | SUBLINGUAL | Status: DC | PRN
  Administered 2017-05-04: 0.4 mg via SUBLINGUAL
  Filled 2017-05-04: qty 25

## 2017-05-04 MED ORDER — METOPROLOL TARTRATE 5 MG/5ML IV SOLN
INTRAVENOUS | Status: AC
  Filled 2017-05-04: qty 10

## 2017-05-05 ENCOUNTER — Telehealth (HOSPITAL_COMMUNITY): Payer: Self-pay

## 2017-05-05 DIAGNOSIS — R945 Abnormal results of liver function studies: Principal | ICD-10-CM

## 2017-05-05 DIAGNOSIS — R7989 Other specified abnormal findings of blood chemistry: Secondary | ICD-10-CM

## 2017-05-05 NOTE — Telephone Encounter (Signed)
Notes recorded by Teresa Coombs, RN on 05/05/2017 at 3:49 PM EST Pt aware of results and agreeable to med changes. referral placed ------  Notes recorded by Laurey Morale, MD on 05/04/2017 at 4:45 PM EST She has nonobstructive CAD. Not significant disease enough to be a likely cause of her chest pain. Medical management. She should start ASA 81 daily. Ideally, she would be on a statin but LFTs recently significantly elevated. Needs followup with GI for further evaluation before I start her on a statin. Make sure she has GI appt.

## 2017-05-19 ENCOUNTER — Encounter (HOSPITAL_COMMUNITY): Payer: Medicare HMO

## 2017-05-24 ENCOUNTER — Other Ambulatory Visit: Payer: Medicare HMO

## 2017-05-30 NOTE — Progress Notes (Signed)
Patient ID: [name], female   DOB: Sep 22, 1970, 46 y.o.   MRN: [id number] PCP: Dr. Larita Fife Cardiology: Dr. Shirlee Latch  46 yo with history of nonischemic cardiomyopathy . Cardiomyopathy was diagnosed back in 5/15.  Echo at that time showed EF 20-25%.  Her sister, of note, had a heart transplantation.  She was taken for RHC/LHC showed preserved cardiac output an no angiographic CAD.  CPX showed a mild to moderate functional limitation for her age.  Echo 1/16 improved with EF 55-60%, echo in 8/16 showed EF stable at 55%.  Echo in 7/18 with EF back down to 45-50%.    Today she returns for HF follow up. Last visit Coreg was increased to 18.75 mg bid and torsemide was increased for a few days. Also referred to GI due to increased LFTs. Tearful about her Grandmothers illness. Denies SOB/orthopnea. Taking all medications.    Labs (10/15): BNP 52, ferritin normal Labs (11/15): K 4.3, creatinine 1.2, SPEP negative, TSH normal Labs (06/05/14): K 4.5 Creatinine 1.6 metolazone was stopped.  Labs (08/24/14): K 4.0, creatinine 1.1, TSH normal, LDL 104 Labs (1/17): K 3.8, creatinine 1.0, AST 214, ALT 142 Labs (5/17): AST 254, ALT 68 Labs (10/17): K 3.1, creatinine 0.76 Labs (1/18): hgb 12.1 Labs (7/18): TSH normal, ALT 100, AST 37, alkaline phosphatase 213 Labs (8/18): K 3, creatinine 0.71, BNP 20 Labs (10/18): hgb 11.5 Labs 04/2017: AST 225, ALT 85 referred to GI.   PMH: 1. Nonischemic cardiomyopathy: Echo (5/15) with EF 20-25%, diffuse hypokinesis, grade II diastolic dysfunction, normal RV size and systolic function.  LHC/RHC (6/15) with no angiographic CAD; mean RA 11, PA 41/16 (mean 27), mean PCWP 15, CI 3.36, PVR 1.8.  CPX (8/15) with RER 1.14, peak VO2 13.7 (63% predicted), peak VO2 20.2 when adjusted for ideal body weight, VE/VCO2 slope 31 => mild to moderate functional limitation.  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 No history of ETOH or drug abuse.  SPEP and TSH negative, ferritin normal.  Echo  07/10/2014 with EF 55-60% Grade I DD.  Echo (8/16) with EF 55%, normal RV size and systolic function.  - Echo (7/18): EF 45-50%, grade II diastolic dysfunction.  2. Type II diabetes 3. HTN 4. Hyperlipidemia 5. Low back pain 6. Menorrhagia: s/p uterine ablation.  7. Bipolar disorder 8. Fibromyalgia 9. Migraines 10. Obesity: s/p gastric bypass.  11. Sleep study 6/16 with minimal OSA.  12. Anxiety 13. Elevated transaminases: Abdominal US with fatty liver.  AMA, ASMA, HCV, HBsAg negative.  14. Anemia  SH: Lives with mother in [place].  No smoking, ETOH, or drugs.   FH: Sister had heart transplant.  Uncle and grandfather with CHF.   ROS: All systems reviewed and negative except as per HPI.   Current Outpatient Medications  Medication Sig Dispense Refill  . Biotin 5000 MCG TABS Take 5,000 mcg by mouth daily.    Marland Kitchen buPROPion (WELLBUTRIN XL) 300 MG 24 hr tablet Take 300 mg by mouth daily before breakfast.     . busPIRone (BUSPAR) 15 MG tablet Take 15 mg by mouth 3 (three) times daily.    . carvedilol (COREG) 12.5 MG tablet Take 1.5 tablets (18.75 mg total) 2 (two) times daily with a meal by mouth. 60 tablet 11  . Cholecalciferol (VITAMIN D3) 5000 units TABS Take 5,000 Units by mouth daily.     . CORLANOR 5 MG TABS tablet TAKE 1 TABLET BY MOUTH 2 (TWO) TIMES DAILY WITH A MEAL. 60 tablet 11  . cyclobenzaprine (FLEXERIL) 10 MG  tablet Take 10 mg by mouth 3 (three) times daily as needed for muscle spasms.     . diclofenac sodium (VOLTAREN) 1 % GEL Apply 2 g topically 2 (two) times daily.    . DULoxetine (CYMBALTA) 60 MG capsule Take 60 mg by mouth daily.    . fluticasone (FLONASE) 50 MCG/ACT nasal spray Place 1 spray into both nostrils daily.     Marland Kitchen ibuprofen (ADVIL,MOTRIN) 800 MG tablet Take 800 mg by mouth every 8 (eight) hours as needed (pain).     . Insulin Glargine (LANTUS SOLOSTAR) 100 UNIT/ML Solostar Pen Inject 30 Units into the skin daily.     Marland Kitchen lidocaine (LIDODERM) 5 % Place 1 patch  onto the skin daily as needed (for back pain).     . lubiprostone (AMITIZA) 24 MCG capsule Take 24 mcg by mouth 2 (two) times daily as needed for constipation.    Marland Kitchen oxyCODONE-acetaminophen (PERCOCET) 10-325 MG per tablet Take 0.5-1 tablets by mouth every 4 (four) hours as needed for pain.    . polyethylene glycol (MIRALAX / GLYCOLAX) packet Take 17 g by mouth 3 (three) times a week.    . potassium chloride (K-DUR) 10 MEQ tablet Take 5 tablets (50 mEq total) daily by mouth. 350 tablet 3  . pregabalin (LYRICA) 150 MG capsule Take 150 mg by mouth 3 (three) times daily.    . ranitidine (ZANTAC) 150 MG tablet Take 150 mg by mouth daily.    . sacubitril-valsartan (ENTRESTO) 97-103 MG TAKE 1 TABLET BY MOUTH 2 (TWO) TIMES DAILY. 60 tablet 6  . spironolactone (ALDACTONE) 25 MG tablet Take 1 tablet (25 mg total) by mouth daily. 90 tablet 3  . torsemide (DEMADEX) 20 MG tablet Take 2 tablets (40 mg total) by mouth daily. 60 tablet 6  . VENTOLIN HFA 108 (90 BASE) MCG/ACT inhaler INHALE 2 PUFFS BY MOUTH EVERY 4 TO 6 HOURS AS NEEDED FOR FOR SHORTNESS OF BREATH OR WHEEZING 8.5 Inhaler 3  . VICTOZA 18 MG/3ML SOPN Inject 1.8 mg into the skin daily.     Current Facility-Administered Medications  Medication Dose Route Frequency Provider Last Rate Last Dose  . 0.9 %  sodium chloride infusion  500 mL Intravenous Continuous Danis, Starr [place] III, MD       BP (!) 174/102 (BP Location: Right Arm, Patient Position: Sitting, Cuff Size: Normal)   Pulse 72   Wt 191 lb 3.2 oz (86.7 kg)   SpO2 98%   BMI 31.82 kg/m   , Filed Weights   05/31/17 0957  Weight: 191 lb 3.2 oz (86.7 kg)   General:  Tearful. No resp difficulty HEENT: normal Neck: supple. no JVD. Carotids 2+ bilat; no bruits. No lymphadenopathy or thryomegaly appreciated. Cor: PMI nondisplaced. Regular rate & rhythm. No rubs, gallops or murmurs. Lungs: clear Abdomen: soft, nontender, nondistended. No hepatosplenomegaly. No bruits or masses. Good bowel  sounds. Extremities: no cyanosis, clubbing, rash, edema Neuro: alert & orientedx3, cranial nerves grossly intact. moves all 4 extremities w/o difficulty. Affect pleasant   Assessment/Plan: 1. Chronic systolic CHF: Nonischemic cardiomyopathy.  Possibly familial (sister had heart transplant).   CPX showed mild to moderate functional limitation in 8/15.  Echo in 1/16 showed recovery of EF to 55-60%.  Echo repeated in 8/16 showed EF 55%, normal RV size and systolic function.  Echo in 7/18 showed EF down a bit to 45-50%. - NYHA II. Volume status stable torsemide 40 mg daily. Continue 50 meq potassium daily.  - Continue current Entresto,  ivabradine, and spironolactone.  - Increase coreg to 25 mg twice a day.   - Check BMET  2. Elevated transaminases: Chronic.  Abdominal US and serologies unrevealing.  Possible NASH. Mild RUQ tenderness.  LFTs elevated last visit.  Referred to GI for elevated LFTs. Not sure what happened. he has been referred to GI.  3. HTN: Elevated. As above, increase Coreg. Add 2.5 mg amlodipine daily. Return to clinic in 2 weeks for BP check.  4. Goiter: Recent TSH was normal. 5. Chest pain: 05/04/2017 Mixed plaque in the proximal LAD with mild stenosis. No chest pain.     Referred to SW for coping mechanisms.   Follow up in 2 weeks for BP check. Follow up with APP clinic 2 months.   Greater than 50% of the (total minutes 25) visit spent in counseling/coordination of care regarding HF meds changes and coping mechanisms.   Amy Clegg NP-C  05/31/2017

## 2017-05-31 ENCOUNTER — Encounter (HOSPITAL_COMMUNITY): Payer: Self-pay

## 2017-05-31 ENCOUNTER — Ambulatory Visit (HOSPITAL_COMMUNITY)
Admission: RE | Admit: 2017-05-31 | Discharge: 2017-05-31 | Disposition: A | Discharge status: Home / Self Care | Payer: Medicare HMO | Source: Ambulatory Visit | Attending: Internal Medicine | Admitting: Internal Medicine

## 2017-05-31 VITALS — BP 174/102 | HR 72 | Wt 191.2 lb

## 2017-05-31 DIAGNOSIS — M797 Fibromyalgia: Secondary | ICD-10-CM | POA: Insufficient documentation

## 2017-05-31 DIAGNOSIS — I2510 Atherosclerotic heart disease of native coronary artery without angina pectoris: Secondary | ICD-10-CM | POA: Insufficient documentation

## 2017-05-31 DIAGNOSIS — I5022 Chronic systolic (congestive) heart failure: Secondary | ICD-10-CM | POA: Diagnosis not present

## 2017-05-31 DIAGNOSIS — Z79899 Other long term (current) drug therapy: Secondary | ICD-10-CM | POA: Diagnosis not present

## 2017-05-31 DIAGNOSIS — Z794 Long term (current) use of insulin: Secondary | ICD-10-CM | POA: Diagnosis not present

## 2017-05-31 DIAGNOSIS — G4733 Obstructive sleep apnea (adult) (pediatric): Secondary | ICD-10-CM | POA: Insufficient documentation

## 2017-05-31 DIAGNOSIS — Z791 Long term (current) use of non-steroidal anti-inflammatories (NSAID): Secondary | ICD-10-CM | POA: Diagnosis not present

## 2017-05-31 DIAGNOSIS — I110 Hypertensive heart disease with heart failure: Secondary | ICD-10-CM | POA: Diagnosis not present

## 2017-05-31 DIAGNOSIS — E669 Obesity, unspecified: Secondary | ICD-10-CM | POA: Diagnosis not present

## 2017-05-31 DIAGNOSIS — R945 Abnormal results of liver function studies: Secondary | ICD-10-CM | POA: Diagnosis not present

## 2017-05-31 DIAGNOSIS — I10 Essential (primary) hypertension: Secondary | ICD-10-CM | POA: Diagnosis not present

## 2017-05-31 DIAGNOSIS — Z9889 Other specified postprocedural states: Secondary | ICD-10-CM | POA: Insufficient documentation

## 2017-05-31 DIAGNOSIS — E049 Nontoxic goiter, unspecified: Secondary | ICD-10-CM | POA: Diagnosis not present

## 2017-05-31 DIAGNOSIS — F419 Anxiety disorder, unspecified: Secondary | ICD-10-CM | POA: Diagnosis not present

## 2017-05-31 DIAGNOSIS — R079 Chest pain, unspecified: Secondary | ICD-10-CM | POA: Insufficient documentation

## 2017-05-31 DIAGNOSIS — Z9884 Bariatric surgery status: Secondary | ICD-10-CM | POA: Diagnosis not present

## 2017-05-31 DIAGNOSIS — Z8249 Family history of ischemic heart disease and other diseases of the circulatory system: Secondary | ICD-10-CM | POA: Diagnosis not present

## 2017-05-31 DIAGNOSIS — R7989 Other specified abnormal findings of blood chemistry: Secondary | ICD-10-CM

## 2017-05-31 DIAGNOSIS — E119 Type 2 diabetes mellitus without complications: Secondary | ICD-10-CM | POA: Diagnosis not present

## 2017-05-31 DIAGNOSIS — F319 Bipolar disorder, unspecified: Secondary | ICD-10-CM | POA: Insufficient documentation

## 2017-05-31 DIAGNOSIS — E785 Hyperlipidemia, unspecified: Secondary | ICD-10-CM | POA: Insufficient documentation

## 2017-05-31 DIAGNOSIS — I429 Cardiomyopathy, unspecified: Secondary | ICD-10-CM | POA: Diagnosis not present

## 2017-05-31 LAB — BASIC METABOLIC PANEL
Anion gap: 8 (ref 5–15)
BUN: 10 mg/dL (ref 6–20)
CALCIUM: 8.6 mg/dL — AB (ref 8.9–10.3)
CO2: 24 mmol/L (ref 22–32)
CREATININE: 0.73 mg/dL (ref 0.44–1.00)
Chloride: 109 mmol/L (ref 101–111)
GFR calc non Af Amer: >60 mL/min (ref >60)
GLUCOSE: 91 mg/dL (ref 65–99)
Potassium: 3 mmol/L — ABNORMAL LOW (ref 3.5–5.1)
Sodium: 141 mmol/L (ref 135–145)

## 2017-05-31 MED ORDER — CARVEDILOL 25 MG PO TABS: 25.0000 mg | ORAL_TABLET | Freq: Two times a day (BID) | ORAL | 6 refills | Status: DC

## 2017-05-31 MED ORDER — AMLODIPINE BESYLATE 2.5 MG PO TABS: 2.5000 mg | ORAL_TABLET | Freq: Every day | ORAL | 3 refills | Status: DC

## 2017-05-31 NOTE — Progress Notes (Signed)
CSW referred to assist with supportive counseling. Patient very tearful and spoke at length about her grandmother who is currently on hospice and patient was informed that death is imminent. Patient stated her blood pressure is up and she is feeling very overwhelmed with grief. CSW provided supportive intervention and offered referrals for grief counseling although patient "not ready yet". CSW provided number for future need and will be available as needed. Lasandra Beech, LCSW, CCSW-MCS [phone]

## 2017-05-31 NOTE — Patient Instructions (Addendum)
INCREASE Carvedilol (Coreg) to 25 mg twice daily. May double on on current 12.5 mg tablets you have at home (Take 2 tabs twice daily). New Rx has been sent to your pharmacy for 25 mg tablets (Take 1 tab twice daily).  START Amlodipine (Norvasc) 2.5 mg tablet once daily.  Routine lab work today. Will notify you of abnormal results, otherwise no news is good news!  Will refer you to [facility]. Address: [address] [place], [place], [place] [address] Hours:  8AM-5PM Phone: [phone] Their office will contact you to schedule.  Return in 2 weeks for BP check.  Follow up 8 weeks with Amy Clegg NP-C. Take all medication as prescribed the day of your appointment. Bring all medications with you to your appointment.  Do the following things EVERYDAY: 1) Weigh yourself in the morning before breakfast. Write it down and keep it in a log. 2) Take your medicines as prescribed 3) Eat low salt foods-Limit salt (sodium) to 2000 mg per day.  4) Stay as active as you can everyday 5) Limit all fluids for the day to less than 2 liters

## 2017-06-02 ENCOUNTER — Telehealth (HOSPITAL_COMMUNITY): Payer: Self-pay | Admitting: Cardiology

## 2017-06-02 DIAGNOSIS — I5022 Chronic systolic (congestive) heart failure: Secondary | ICD-10-CM

## 2017-06-02 MED ORDER — POTASSIUM CHLORIDE ER 10 MEQ PO TBCR: 50.0000 meq | EXTENDED_RELEASE_TABLET | Freq: Two times a day (BID) | ORAL | 3 refills | Status: DC

## 2017-06-02 NOTE — Addendum Note (Signed)
Addended by: Gen Clagg, Milagros Reap on: 06/02/2017 03:02 PM   Modules accepted: Orders

## 2017-06-02 NOTE — Telephone Encounter (Signed)
Patient reports she is taking 50 meq daily, per Amy Clegg,NP, increase to 50 BID Patient aware and voiced understanding, will recheck labs at follow up 12/31

## 2017-06-02 NOTE — Telephone Encounter (Signed)
-----   Message from Sherald Hess, NP sent at 06/01/2017  5:02 PM EST ----- Please verify potassium dose. Increase potassium to 40 meq twice a day. Repeat BMET in 7-10 days./

## 2017-06-14 ENCOUNTER — Encounter (HOSPITAL_COMMUNITY): Payer: Medicare HMO

## 2017-06-21 ENCOUNTER — Ambulatory Visit (HOSPITAL_COMMUNITY)
Admission: RE | Admit: 2017-06-21 | Discharge: 2017-06-21 | Disposition: A | Discharge status: Home / Self Care | Payer: Medicare HMO | Source: Ambulatory Visit | Attending: Internal Medicine | Admitting: Internal Medicine

## 2017-06-21 ENCOUNTER — Other Ambulatory Visit: Payer: Self-pay

## 2017-06-21 VITALS — BP 155/78 | HR 69

## 2017-06-21 DIAGNOSIS — R0789 Other chest pain: Secondary | ICD-10-CM | POA: Diagnosis not present

## 2017-06-21 DIAGNOSIS — I5022 Chronic systolic (congestive) heart failure: Secondary | ICD-10-CM | POA: Insufficient documentation

## 2017-06-21 DIAGNOSIS — R030 Elevated blood-pressure reading, without diagnosis of hypertension: Secondary | ICD-10-CM | POA: Diagnosis not present

## 2017-06-21 LAB — BASIC METABOLIC PANEL
ANION GAP: 7 (ref 5–15)
BUN: 12 mg/dL (ref 6–20)
CALCIUM: 8.3 mg/dL — AB (ref 8.9–10.3)
CO2: 20 mmol/L — ABNORMAL LOW (ref 22–32)
Chloride: 113 mmol/L — ABNORMAL HIGH (ref 101–111)
Creatinine, Ser: 0.76 mg/dL (ref 0.44–1.00)
Glucose, Bld: 225 mg/dL — ABNORMAL HIGH (ref 65–99)
Potassium: 3.1 mmol/L — ABNORMAL LOW (ref 3.5–5.1)
SODIUM: 140 mmol/L (ref 135–145)

## 2017-06-21 MED ORDER — AMLODIPINE BESYLATE 5 MG PO TABS: 5.0000 mg | ORAL_TABLET | Freq: Every day | ORAL | 3 refills | Status: DC

## 2017-06-21 NOTE — Progress Notes (Signed)
Pt had nurse visit to check BP x 3 times. Pt BP is still high. Per [place], [place] increase Norvasc 5 mg daily. Pt also c/o CP. EKG done no remarkable changes, and BMET drawn per Amy NP.

## 2017-06-21 NOTE — Patient Instructions (Signed)
Increase Amlodipine 5 mg daily   Keep all follow up appointments

## 2017-06-24 ENCOUNTER — Telehealth (HOSPITAL_COMMUNITY): Payer: Self-pay

## 2017-06-24 DIAGNOSIS — I5022 Chronic systolic (congestive) heart failure: Secondary | ICD-10-CM

## 2017-06-24 MED ORDER — POTASSIUM CHLORIDE ER 10 MEQ PO TBCR: 60.0000 meq | EXTENDED_RELEASE_TABLET | Freq: Two times a day (BID) | ORAL | 6 refills | Status: DC

## 2017-06-24 NOTE — Telephone Encounter (Signed)
Notes recorded by Chyrl Civatte, RN on 06/24/2017 at 3:57 PM EST Patient confirms taking potassium 50 meq BID and spironolactone 25 mg once daily when asked open-endedly how she takes these medications. Agrees to increase potassium to 60 meq BID and return next Friday 1/18 for repeat bmet. ------  Notes recorded by Graciella Freer, PA-C on 06/24/2017 at 7:33 AM EST Please confirm how she is taking her K. Looks like Amy increased it to 50 meq BID on 05/31/18. Please confirm she is taking her spiro.    If she IS taking 50 meq BID. Have her take an extra 40 meq today, and then increase her dose to 60 meq BID.   Needs repeat 7-10 days.   Not sure how the labs were routed. I've never seen her.  Casimiro Needle [address]" [place], PA-C 06/24/2017 7:31 AM ------  Notes recorded by Dolores Patty, MD on 06/23/2017 at 9:44 PM EST Have we supped her K?

## 2017-06-25 ENCOUNTER — Ambulatory Visit: Payer: Medicare HMO | Admitting: Gastroenterology

## 2017-06-25 ENCOUNTER — Other Ambulatory Visit: Payer: Self-pay

## 2017-07-02 ENCOUNTER — Other Ambulatory Visit (HOSPITAL_COMMUNITY): Payer: Medicare HMO

## 2017-07-16 ENCOUNTER — Other Ambulatory Visit (HOSPITAL_COMMUNITY): Payer: Self-pay | Admitting: Cardiology

## 2017-07-16 DIAGNOSIS — I5022 Chronic systolic (congestive) heart failure: Secondary | ICD-10-CM

## 2017-07-20 DIAGNOSIS — M5416 Radiculopathy, lumbar region: Secondary | ICD-10-CM | POA: Diagnosis not present

## 2017-07-20 DIAGNOSIS — Z9689 Presence of other specified functional implants: Secondary | ICD-10-CM | POA: Diagnosis not present

## 2017-07-20 DIAGNOSIS — I10 Essential (primary) hypertension: Secondary | ICD-10-CM | POA: Diagnosis not present

## 2017-07-20 DIAGNOSIS — M961 Postlaminectomy syndrome, not elsewhere classified: Secondary | ICD-10-CM | POA: Diagnosis not present

## 2017-07-26 ENCOUNTER — Other Ambulatory Visit: Payer: Medicare HMO

## 2017-07-26 ENCOUNTER — Encounter (HOSPITAL_COMMUNITY): Payer: Medicare HMO

## 2017-08-02 ENCOUNTER — Encounter (HOSPITAL_COMMUNITY): Payer: Medicare HMO

## 2017-08-18 ENCOUNTER — Encounter (HOSPITAL_COMMUNITY): Payer: Medicare HMO

## 2017-08-18 ENCOUNTER — Ambulatory Visit (HOSPITAL_COMMUNITY)
Admission: RE | Admit: 2017-08-18 | Discharge: 2017-08-18 | Disposition: A | Discharge status: Home / Self Care | Payer: Medicare HMO | Source: Ambulatory Visit | Attending: Internal Medicine | Admitting: Internal Medicine

## 2017-08-18 ENCOUNTER — Encounter (HOSPITAL_COMMUNITY): Payer: Self-pay

## 2017-08-18 VITALS — BP 148/86 | HR 72 | Wt 196.0 lb

## 2017-08-18 DIAGNOSIS — M797 Fibromyalgia: Secondary | ICD-10-CM | POA: Insufficient documentation

## 2017-08-18 DIAGNOSIS — E876 Hypokalemia: Secondary | ICD-10-CM

## 2017-08-18 DIAGNOSIS — R002 Palpitations: Secondary | ICD-10-CM | POA: Diagnosis not present

## 2017-08-18 DIAGNOSIS — I110 Hypertensive heart disease with heart failure: Secondary | ICD-10-CM | POA: Diagnosis not present

## 2017-08-18 DIAGNOSIS — E119 Type 2 diabetes mellitus without complications: Secondary | ICD-10-CM | POA: Diagnosis not present

## 2017-08-18 DIAGNOSIS — I5022 Chronic systolic (congestive) heart failure: Secondary | ICD-10-CM

## 2017-08-18 DIAGNOSIS — F319 Bipolar disorder, unspecified: Secondary | ICD-10-CM | POA: Insufficient documentation

## 2017-08-18 DIAGNOSIS — E049 Nontoxic goiter, unspecified: Secondary | ICD-10-CM | POA: Diagnosis not present

## 2017-08-18 DIAGNOSIS — E669 Obesity, unspecified: Secondary | ICD-10-CM | POA: Diagnosis not present

## 2017-08-18 DIAGNOSIS — E785 Hyperlipidemia, unspecified: Secondary | ICD-10-CM | POA: Insufficient documentation

## 2017-08-18 DIAGNOSIS — Z794 Long term (current) use of insulin: Secondary | ICD-10-CM | POA: Insufficient documentation

## 2017-08-18 DIAGNOSIS — Z9884 Bariatric surgery status: Secondary | ICD-10-CM | POA: Insufficient documentation

## 2017-08-18 DIAGNOSIS — F419 Anxiety disorder, unspecified: Secondary | ICD-10-CM | POA: Insufficient documentation

## 2017-08-18 DIAGNOSIS — R945 Abnormal results of liver function studies: Secondary | ICD-10-CM

## 2017-08-18 DIAGNOSIS — Z79899 Other long term (current) drug therapy: Secondary | ICD-10-CM | POA: Insufficient documentation

## 2017-08-18 DIAGNOSIS — G4700 Insomnia, unspecified: Secondary | ICD-10-CM | POA: Diagnosis not present

## 2017-08-18 DIAGNOSIS — I428 Other cardiomyopathies: Secondary | ICD-10-CM | POA: Insufficient documentation

## 2017-08-18 DIAGNOSIS — G4733 Obstructive sleep apnea (adult) (pediatric): Secondary | ICD-10-CM | POA: Insufficient documentation

## 2017-08-18 DIAGNOSIS — R7989 Other specified abnormal findings of blood chemistry: Secondary | ICD-10-CM

## 2017-08-18 LAB — MAGNESIUM: Magnesium: 1.9 mg/dL (ref 1.7–2.4)

## 2017-08-18 LAB — BASIC METABOLIC PANEL
ANION GAP: 8 (ref 5–15)
BUN: 9 mg/dL (ref 6–20)
CHLORIDE: 108 mmol/L (ref 101–111)
CO2: 25 mmol/L (ref 22–32)
Calcium: 8.4 mg/dL — ABNORMAL LOW (ref 8.9–10.3)
Creatinine, Ser: 0.77 mg/dL (ref 0.44–1.00)
GFR calc non Af Amer: >60 mL/min (ref >60)
GLUCOSE: 164 mg/dL — AB (ref 65–99)
POTASSIUM: 3.3 mmol/L — AB (ref 3.5–5.1)
Sodium: 141 mmol/L (ref 135–145)

## 2017-08-18 NOTE — Patient Instructions (Signed)
Routine lab work today. Will notify you of abnormal results, otherwise no news is good news!  Will schedule you for a holter monitor (heart monitor) to be placed at [facility] office. Address: [address]), [place], [place] [address]  Phone: [phone] Their office will contact you by phone to schedule.  Follow up 2-3 months with Dr. Shirlee Latch.  ____________________________________________________________ [place] Code:  Take all medication as prescribed the day of your appointment. Bring all medications with you to your appointment.  Do the following things EVERYDAY: 1) Weigh yourself in the morning before breakfast. Write it down and keep it in a log. 2) Take your medicines as prescribed 3) Eat low salt foods-Limit salt (sodium) to 2000 mg per day.  4) Stay as active as you can everyday 5) Limit all fluids for the day to less than 2 liters

## 2017-08-18 NOTE — Progress Notes (Signed)
Patient ID: [name], female   DOB: [birth year], 47 y.o.   MRN: [id number]     Advanced Heart Failure Clinic Note   PCP: Dr. Larita Fife Cardiology: Dr. Maralyn Sago is a 47 y.o. female with history of nonischemic cardiomyopathy . Cardiomyopathy was diagnosed back in 5/15.  Echo at that time showed EF 20-25%.  Her sister, of note, had a heart transplantation.  She was taken for RHC/LHC showed preserved cardiac output an no angiographic CAD.  CPX showed a mild to moderate functional limitation for her age.  Echo 1/16 improved with EF 55-60%, echo in 8/16 showed EF stable at 55%.  Echo in 7/18 with EF back down to 45-50%.    She presents today for regular follow up. Overall feeling well.  Potassium recently increased, but did not return for repeat. Her grandmother passed away so she has been dealing with a lot of grief and stress, but is improving.  She does not check her BP at home. She has mild SOB up inclines or stairs, but gets around on flat ground at pace without difficulty.  She has occasional peripheral edema, but only takes an extra torsemide 1-2 times a month. Denies lightheadedness or dizziness. Taking all medications as directed.   EKG today: NSR 69 bpm, personally reviewed  Labs (1/18): hgb 12.1 Labs (7/18): TSH normal, ALT 100, AST 37, alkaline phosphatase 213 Labs (8/18): K 3, creatinine 0.71, BNP 20 Labs (10/18): hgb 11.5 Labs 04/2017: AST 225, ALT 85 referred to GI.   PMH: 1. Nonischemic cardiomyopathy: Echo (5/15) with EF 20-25%, diffuse hypokinesis, grade II diastolic dysfunction, normal RV size and systolic function.  LHC/RHC (6/15) with no angiographic CAD; mean RA 11, PA 41/16 (mean 27), mean PCWP 15, CI 3.36, PVR 1.8.  CPX (8/15) with RER 1.14, peak VO2 13.7 (63% predicted), peak VO2 20.2 when adjusted for ideal body weight, VE/VCO2 slope 31 => mild to moderate functional limitation.  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 No history of ETOH or drug abuse.  SPEP and  TSH negative, ferritin normal.  Echo 07/10/2014 with EF 55-60% Grade I DD.  Echo (8/16) with EF 55%, normal RV size and systolic function.  - Echo (7/18): EF 45-50%, grade II diastolic dysfunction.  2. Type II diabetes 3. HTN 4. Hyperlipidemia 5. Low back pain 6. Menorrhagia: s/p uterine ablation.  7. Bipolar disorder 8. Fibromyalgia 9. Migraines 10. Obesity: s/p gastric bypass.  11. Sleep study 6/16 with minimal OSA.  12. Anxiety 13. Elevated transaminases: Abdominal US with fatty liver.  AMA, ASMA, HCV, HBsAg negative.  14. Anemia  SH: Lives with mother in [place].  No smoking, ETOH, or drugs.   FH: Sister had heart transplant.  Uncle and grandfather with CHF.   Review of systems complete and found to be negative unless listed in HPI.    Current Outpatient Medications  Medication Sig Dispense Refill  . amLODipine (NORVASC) 5 MG tablet Take 1 tablet (5 mg total) by mouth daily. 90 tablet 3  . Biotin 5000 MCG TABS Take 5,000 mcg by mouth daily.    Marland Kitchen buPROPion (WELLBUTRIN XL) 300 MG 24 hr tablet Take 300 mg by mouth daily before breakfast.     . busPIRone (BUSPAR) 15 MG tablet Take 15 mg by mouth 3 (three) times daily.    . carvedilol (COREG) 25 MG tablet Take 1 tablet (25 mg total) by mouth 2 (two) times daily with a meal. 60 tablet 6  . Cholecalciferol (VITAMIN D3) 5000 units  TABS Take 5,000 Units by mouth daily.     . CORLANOR 5 MG TABS tablet TAKE 1 TABLET BY MOUTH 2 (TWO) TIMES DAILY WITH A MEAL. 60 tablet 11  . cyclobenzaprine (FLEXERIL) 10 MG tablet Take 10 mg by mouth 3 (three) times daily as needed for muscle spasms.     . diclofenac sodium (VOLTAREN) 1 % GEL Apply 2 g topically 2 (two) times daily.    . DULoxetine (CYMBALTA) 60 MG capsule Take 60 mg by mouth daily.    . fluticasone (FLONASE) 50 MCG/ACT nasal spray Place 1 spray into both nostrils daily.     Marland Kitchen ibuprofen (ADVIL,MOTRIN) 800 MG tablet Take 800 mg by mouth every 8 (eight) hours as needed (pain).     . Insulin  Glargine (LANTUS SOLOSTAR) 100 UNIT/ML Solostar Pen Inject 30 Units into the skin daily.     Marland Kitchen lidocaine (LIDODERM) 5 % Place 1 patch onto the skin daily as needed (for back pain).     . lubiprostone (AMITIZA) 24 MCG capsule Take 24 mcg by mouth 2 (two) times daily as needed for constipation.    Marland Kitchen oxyCODONE-acetaminophen (PERCOCET) 10-325 MG per tablet Take 0.5-1 tablets by mouth every 4 (four) hours as needed for pain.    . polyethylene glycol (MIRALAX / GLYCOLAX) packet Take 17 g by mouth 3 (three) times a week.    . potassium chloride (K-DUR) 10 MEQ tablet Take 6 tablets (60 mEq total) by mouth 2 (two) times daily. 320 tablet 6  . pregabalin (LYRICA) 150 MG capsule Take 150 mg by mouth 3 (three) times daily.    . ranitidine (ZANTAC) 150 MG tablet Take 150 mg by mouth daily.    . sacubitril-valsartan (ENTRESTO) 97-103 MG TAKE 1 TABLET BY MOUTH 2 (TWO) TIMES DAILY. 60 tablet 6  . spironolactone (ALDACTONE) 25 MG tablet TAKE 1 TABLET BY MOUTH DAILY. 90 tablet 3  . torsemide (DEMADEX) 20 MG tablet TAKE 3 TABLETS BY MOUTH EVERY DAY 90 tablet 5  . VENTOLIN HFA 108 (90 BASE) MCG/ACT inhaler INHALE 2 PUFFS BY MOUTH EVERY 4 TO 6 HOURS AS NEEDED FOR FOR SHORTNESS OF BREATH OR WHEEZING 8.5 Inhaler 3  . VICTOZA 18 MG/3ML SOPN Inject 1.8 mg into the skin daily.     Current Facility-Administered Medications  Medication Dose Route Frequency Provider Last Rate Last Dose  . 0.9 %  sodium chloride infusion  500 mL Intravenous Continuous Charlie Pitter III, MD       Vitals:   08/18/17 0957  BP: (!) 148/86  Pulse: 72  SpO2: 100%  Weight: 196 lb (88.9 kg)   Wt Readings from Last 3 Encounters:  08/18/17 196 lb (88.9 kg)  05/31/17 191 lb 3.2 oz (86.7 kg)  04/19/17 188 lb 6.4 oz (85.5 kg)    Physical Exam General: Well appearing. No resp difficulty. HEENT: Normal Neck: Supple. JVP 5-6. Carotids 2+ bilat; no bruits. No thyromegaly or nodule noted. Cor: PMI nondisplaced. RRR, No M/G/R noted Lungs: CTAB,  normal effort. Abdomen: Soft, non-tender, non-distended, no HSM. No bruits or masses. +BS  Extremities: No cyanosis, clubbing, or rash. R and LLE no edema.  Neuro: Alert & orientedx3, cranial nerves grossly intact. moves all 4 extremities w/o difficulty. Affect pleasant   Assessment/Plan: 1. Chronic systolic CHF: Nonischemic cardiomyopathy.  Possibly familial (sister had heart transplant).   CPX showed mild to moderate functional limitation in 8/15.  Echo in 1/16 showed recovery of EF to 55-60%.  Echo repeated in 8/16  showed EF 55%, normal RV size and systolic function.  Echo in 7/18 showed EF down a bit to 45-50%. - NYHA II - Volume status stable despite weight gain - Continue torsemide 60 mg daily. BMET today.  - Continue Entresto 97/103 mg BID.  - Continue ivabradine 5 mg BID - Continue spiro 25 mg daily.  - Continue coreg 25 mg BID - Reinforced fluid restriction to < 2 L daily, sodium restriction to less than 2000 mg daily, and the importance of daily weights.   2. Elevated transaminases: Chronic.  Abdominal US and serologies unrevealing.   - Possible NASH. Mild RUQ tenderness. Follows at GI.  3. HTN:  - Elevated. Increase amlodipine to 10 mg daily. Have recommended she keeps BP log at home.  4. Goiter: Recent TSH was normal. Per PCP 5. Chest pain: 05/04/2017 Mixed plaque in the proximal LAD with mild stenosis.  - No s/s of ischemia.    6. Palpitations - 4-5 times a week. Will place holter monitor.  7. Hypokalemia - Continue potassium 60 meq BID. BMET today.   Doing well overall. Labs and meds as above. RTC 2-3 months for MD visit.   Graciella Freer, PA-C  08/18/2017  Greater than 50% of the 25 minute visit was spent in counseling/coordination of care regarding disease state education, salt/fluid restriction, sliding scale diuretics, and medication compliance.

## 2017-08-19 ENCOUNTER — Telehealth (HOSPITAL_COMMUNITY): Payer: Self-pay | Admitting: Cardiology

## 2017-08-19 ENCOUNTER — Other Ambulatory Visit (HOSPITAL_COMMUNITY): Payer: Self-pay | Admitting: *Deleted

## 2017-08-19 DIAGNOSIS — I5022 Chronic systolic (congestive) heart failure: Secondary | ICD-10-CM

## 2017-08-19 MED ORDER — AMLODIPINE BESYLATE 10 MG PO TABS: 10.0000 mg | ORAL_TABLET | Freq: Every day | ORAL | 3 refills | Status: DC

## 2017-08-19 MED ORDER — POTASSIUM CHLORIDE ER 10 MEQ PO TBCR: 40.0000 meq | EXTENDED_RELEASE_TABLET | Freq: Three times a day (TID) | ORAL | 6 refills | Status: DC

## 2017-08-19 MED ORDER — MAGNESIUM 200 MG PO TABS: 400.0000 mg | ORAL_TABLET | Freq: Every day | ORAL | 11 refills | Status: DC

## 2017-08-19 MED ORDER — MAGNESIUM 400 MG PO TABS: 400.0000 mg | ORAL_TABLET | Freq: Every day | ORAL | 3 refills | Status: DC

## 2017-08-19 NOTE — Addendum Note (Signed)
Addended by: JEFFRIES, Milagros Reap on: 08/19/2017 03:19 PM   Modules accepted: Orders

## 2017-08-19 NOTE — Telephone Encounter (Signed)
-----   Message from Graciella Freer, PA-C sent at 08/18/2017  1:28 PM EST ----- Start on 400 magnesium daily and separate potassium out to 40 meq TID for better absorption.   Repeat 7-10 days after starting these.     Casimiro Needle [address]" [place], PA-C 08/18/2017 1:28 PM

## 2017-08-19 NOTE — Telephone Encounter (Signed)
Patient aware. Patient voiced understanding. Repeat labs 3/18 Reports her script for amlodipine was never called into pharmacy, will send new rx

## 2017-08-20 ENCOUNTER — Ambulatory Visit (INDEPENDENT_AMBULATORY_CARE_PROVIDER_SITE_OTHER): Payer: Medicare HMO

## 2017-08-20 DIAGNOSIS — R002 Palpitations: Secondary | ICD-10-CM | POA: Diagnosis not present

## 2017-08-25 ENCOUNTER — Other Ambulatory Visit (HOSPITAL_COMMUNITY): Payer: Self-pay | Admitting: Cardiology

## 2017-08-30 ENCOUNTER — Other Ambulatory Visit (HOSPITAL_COMMUNITY): Payer: Medicare HMO

## 2017-09-06 ENCOUNTER — Telehealth (HOSPITAL_COMMUNITY): Payer: Self-pay

## 2017-09-06 NOTE — Telephone Encounter (Signed)
Result Notes for Holter monitor - 48 hour   Notes recorded by Chyrl Civatte, RN on 09/06/2017 at 11:19 AM EDT LVMTCB. ------  Notes recorded by Graciella Freer, PA-C on 08/27/2017 at 5:24 PM EDT Everything looks OK.    Casimiro Needle [address] [place], PA-C 08/27/2017 5:24 PM

## 2017-09-23 ENCOUNTER — Other Ambulatory Visit: Payer: Medicare HMO

## 2017-09-23 ENCOUNTER — Ambulatory Visit: Payer: Medicare HMO | Admitting: Hematology

## 2017-10-19 DIAGNOSIS — M961 Postlaminectomy syndrome, not elsewhere classified: Secondary | ICD-10-CM | POA: Diagnosis not present

## 2017-10-19 DIAGNOSIS — M5416 Radiculopathy, lumbar region: Secondary | ICD-10-CM | POA: Diagnosis not present

## 2017-10-19 DIAGNOSIS — Z6832 Body mass index (BMI) 32.0-32.9, adult: Secondary | ICD-10-CM | POA: Diagnosis not present

## 2017-10-19 DIAGNOSIS — Z9689 Presence of other specified functional implants: Secondary | ICD-10-CM | POA: Diagnosis not present

## 2017-11-16 ENCOUNTER — Ambulatory Visit (HOSPITAL_COMMUNITY)
Admission: RE | Admit: 2017-11-16 | Discharge: 2017-11-16 | Disposition: A | Discharge status: Home / Self Care | Payer: Medicare HMO | Source: Ambulatory Visit | Attending: Cardiology | Admitting: Cardiology

## 2017-11-16 ENCOUNTER — Encounter (HOSPITAL_COMMUNITY): Payer: Self-pay | Admitting: Cardiology

## 2017-11-16 ENCOUNTER — Other Ambulatory Visit: Payer: Self-pay

## 2017-11-16 VITALS — BP 144/90 | HR 74 | Wt 194.8 lb

## 2017-11-16 DIAGNOSIS — I429 Cardiomyopathy, unspecified: Secondary | ICD-10-CM | POA: Diagnosis not present

## 2017-11-16 DIAGNOSIS — E049 Nontoxic goiter, unspecified: Secondary | ICD-10-CM | POA: Diagnosis not present

## 2017-11-16 DIAGNOSIS — R079 Chest pain, unspecified: Secondary | ICD-10-CM

## 2017-11-16 DIAGNOSIS — Z791 Long term (current) use of non-steroidal anti-inflammatories (NSAID): Secondary | ICD-10-CM | POA: Insufficient documentation

## 2017-11-16 DIAGNOSIS — E119 Type 2 diabetes mellitus without complications: Secondary | ICD-10-CM | POA: Diagnosis not present

## 2017-11-16 DIAGNOSIS — I25119 Atherosclerotic heart disease of native coronary artery with unspecified angina pectoris: Secondary | ICD-10-CM | POA: Diagnosis not present

## 2017-11-16 DIAGNOSIS — E785 Hyperlipidemia, unspecified: Secondary | ICD-10-CM | POA: Diagnosis not present

## 2017-11-16 DIAGNOSIS — I5022 Chronic systolic (congestive) heart failure: Secondary | ICD-10-CM

## 2017-11-16 DIAGNOSIS — Z79891 Long term (current) use of opiate analgesic: Secondary | ICD-10-CM | POA: Diagnosis not present

## 2017-11-16 DIAGNOSIS — E669 Obesity, unspecified: Secondary | ICD-10-CM | POA: Insufficient documentation

## 2017-11-16 DIAGNOSIS — Z8249 Family history of ischemic heart disease and other diseases of the circulatory system: Secondary | ICD-10-CM | POA: Diagnosis not present

## 2017-11-16 DIAGNOSIS — I110 Hypertensive heart disease with heart failure: Secondary | ICD-10-CM | POA: Insufficient documentation

## 2017-11-16 DIAGNOSIS — F319 Bipolar disorder, unspecified: Secondary | ICD-10-CM | POA: Insufficient documentation

## 2017-11-16 DIAGNOSIS — I2510 Atherosclerotic heart disease of native coronary artery without angina pectoris: Secondary | ICD-10-CM | POA: Diagnosis not present

## 2017-11-16 DIAGNOSIS — Z9884 Bariatric surgery status: Secondary | ICD-10-CM | POA: Insufficient documentation

## 2017-11-16 DIAGNOSIS — F419 Anxiety disorder, unspecified: Secondary | ICD-10-CM | POA: Insufficient documentation

## 2017-11-16 DIAGNOSIS — G4733 Obstructive sleep apnea (adult) (pediatric): Secondary | ICD-10-CM | POA: Diagnosis not present

## 2017-11-16 DIAGNOSIS — M797 Fibromyalgia: Secondary | ICD-10-CM | POA: Insufficient documentation

## 2017-11-16 DIAGNOSIS — Z794 Long term (current) use of insulin: Secondary | ICD-10-CM | POA: Diagnosis not present

## 2017-11-16 DIAGNOSIS — Z79899 Other long term (current) drug therapy: Secondary | ICD-10-CM | POA: Insufficient documentation

## 2017-11-16 LAB — BRAIN NATRIURETIC PEPTIDE: B Natriuretic Peptide: 20.4 pg/mL (ref 0.0–100.0)

## 2017-11-16 LAB — BASIC METABOLIC PANEL
ANION GAP: 10 (ref 5–15)
BUN: 13 mg/dL (ref 6–20)
CALCIUM: 8.6 mg/dL — AB (ref 8.9–10.3)
CO2: 23 mmol/L (ref 22–32)
Chloride: 107 mmol/L (ref 101–111)
Creatinine, Ser: 0.79 mg/dL (ref 0.44–1.00)
GFR calc Af Amer: >60 mL/min (ref >60)
Glucose, Bld: 100 mg/dL — ABNORMAL HIGH (ref 65–99)
Potassium: 3.5 mmol/L (ref 3.5–5.1)
SODIUM: 140 mmol/L (ref 135–145)

## 2017-11-16 LAB — LIPID PANEL
Cholesterol: 167 mg/dL (ref 0–200)
HDL: 63 mg/dL (ref >40)
LDL CALC: 85 mg/dL (ref 0–99)
TRIGLYCERIDES: 94 mg/dL (ref <150)
Total CHOL/HDL Ratio: 2.7 RATIO
VLDL: 19 mg/dL (ref 0–40)

## 2017-11-16 LAB — TSH: TSH: 0.874 u[IU]/mL (ref 0.350–4.500)

## 2017-11-16 NOTE — Patient Instructions (Addendum)
Increase Torsemide to 80 mg daily  Labs today  Labs in 2 weeks  Your physician has requested that you have an echocardiogram. Echocardiography is a painless test that uses sound waves to create images of your heart. It provides your doctor with information about the size and shape of your heart and how well your heart's chambers and valves are working. This procedure takes approximately one hour. There are no restrictions for this procedure.  Your physician has requested that you have a lexiscan myoview. For further information please visit [email or web address] Please follow instruction sheet, as given.  Your physician recommends that you schedule a follow-up appointment in: 2 months

## 2017-11-18 NOTE — Progress Notes (Signed)
Patient ID: [name], female   DOB: 02/04/1971, 47 y.o.   MRN: [id number] PCP: Dr. Larita Fife Cardiology: Dr. Shirlee Latch  47 y.o. with history of nonischemic cardiomyopathy. Cardiomyopathy was diagnosed back in 5/15.  Echo at that time showed EF 20-25%.  Her sister, of note, had a heart transplantation.  She was taken for RHC/LHC showed preserved cardiac output an no angiographic CAD.  CPX showed a mild to moderate functional limitation for her age.  Echo 1/16 improved with EF 55-60%, echo in 8/16 showed EF stable at 55%.  Echo in 7/18 with EF back down to 45-50%.    She returns for followup of CHF today.  She has felt worse recently.  She is short of breath walking up a hill, worse than prior.  She is short of breath walking up stairs. She does ok on flat ground.  She has chest pain as well when walking up a hill.  She feels more fatigued, decreased energy.    ECG (personally reviewed): NSR, LVH  Labs (10/15): BNP 52, ferritin normal Labs (11/15): K 4.3, creatinine 1.2, SPEP negative, TSH normal Labs (06/05/14): K 4.5 Creatinine 1.6 metolazone was stopped.  Labs (08/24/14): K 4.0, creatinine 1.1, TSH normal, LDL 104 Labs (1/17): K 3.8, creatinine 1.0, AST 214, ALT 142 Labs (5/17): AST 254, ALT 68 Labs (10/17): K 3.1, creatinine 0.76 Labs (1/18): hgb 12.1 Labs (7/18): TSH normal, ALT 100, AST 37, alkaline phosphatase 213 Labs (8/18): K 3, creatinine 0.71, BNP 20 Labs (10/18): hgb 11.5 Labs (3/19): K 3.3, creatinine 0.77  PMH: 1. Nonischemic cardiomyopathy: Echo (5/15) with EF 20-25%, diffuse hypokinesis, grade II diastolic dysfunction, normal RV size and systolic function.  LHC/RHC (6/15) with no angiographic CAD; mean RA 11, PA 41/16 (mean 27), mean PCWP 15, CI 3.36, PVR 1.8.  CPX (8/15) with RER 1.14, peak VO2 13.7 (63% predicted), peak VO2 20.2 when adjusted for ideal body weight, VE/VCO2 slope 31 => mild to moderate functional limitation.  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 No history of ETOH  or drug abuse.  SPEP and TSH negative, ferritin normal.  Echo 07/10/2014 with EF 55-60% Grade I DD.  Echo (8/16) with EF 55%, normal RV size and systolic function.  - Echo (7/18): EF 45-50%, grade II diastolic dysfunction.  2. Type II diabetes 3. HTN 4. Hyperlipidemia 5. Low back pain 6. Menorrhagia: s/p uterine ablation.  7. Bipolar disorder 8. Fibromyalgia 9. Migraines 10. Obesity: s/p gastric bypass.  11. Sleep study 6/16 with minimal OSA.  12. Anxiety 13. Elevated transaminases: Abdominal US with fatty liver.  AMA, ASMA, HCV, HBsAg negative.  14. Anemia 15. Holter (3/19): 2 short NSVT runs, rare PVCs/PACs.  16. CAD: Coronary CTA in 11/18 with mild stenosis proximal LAD, calcium score in the 97th percentile.   SH: Lives with mother in [place].  No smoking, ETOH, or drugs.   FH: Sister had heart transplant. She had another sister with CHF who died.  Uncle and grandfather with CHF. CAD runs in family.   ROS: All systems reviewed and negative except as per HPI.   Current Outpatient Medications  Medication Sig Dispense Refill  . amLODipine (NORVASC) 10 MG tablet Take 1 tablet (10 mg total) by mouth daily. 90 tablet 3  . Biotin 5000 MCG TABS Take 5,000 mcg by mouth daily.    Marland Kitchen buPROPion (WELLBUTRIN XL) 300 MG 24 hr tablet Take 300 mg by mouth daily before breakfast.     . busPIRone (BUSPAR) 15 MG tablet Take 15 mg  by mouth 3 (three) times daily.    . carvedilol (COREG) 25 MG tablet Take 1 tablet (25 mg total) by mouth 2 (two) times daily with a meal. 60 tablet 6  . Cholecalciferol (VITAMIN D3) 5000 units TABS Take 5,000 Units by mouth daily.     . CORLANOR 5 MG TABS tablet TAKE 1 TABLET BY MOUTH 2 (TWO) TIMES DAILY WITH A MEAL. 60 tablet 11  . cyclobenzaprine (FLEXERIL) 10 MG tablet Take 10 mg by mouth 3 (three) times daily as needed for muscle spasms.     . diclofenac sodium (VOLTAREN) 1 % GEL Apply 2 g topically 2 (two) times daily.    . DULoxetine (CYMBALTA) 60 MG capsule Take 60  mg by mouth daily.    Marland Kitchen ENTRESTO 97-103 MG TAKE 1 TABLET BY MOUTH 2 (TWO) TIMES DAILY. 60 tablet 6  . fluticasone (FLONASE) 50 MCG/ACT nasal spray Place 1 spray into both nostrils daily.     Marland Kitchen ibuprofen (ADVIL,MOTRIN) 800 MG tablet Take 800 mg by mouth every 8 (eight) hours as needed (pain).     . Insulin Glargine (LANTUS SOLOSTAR) 100 UNIT/ML Solostar Pen Inject 30 Units into the skin daily.     Marland Kitchen lidocaine (LIDODERM) 5 % Place 1 patch onto the skin daily as needed (for back pain).     . lubiprostone (AMITIZA) 24 MCG capsule Take 24 mcg by mouth 2 (two) times daily as needed for constipation.    . Magnesium 400 MG TABS Take 400 mg by mouth daily. 30 tablet 3  . oxyCODONE-acetaminophen (PERCOCET) 10-325 MG per tablet Take 0.5-1 tablets by mouth every 4 (four) hours as needed for pain.    . polyethylene glycol (MIRALAX / GLYCOLAX) packet Take 17 g by mouth 3 (three) times a week.    . potassium chloride (K-DUR) 10 MEQ tablet Take 4 tablets (40 mEq total) by mouth 3 (three) times daily. 320 tablet 6  . pregabalin (LYRICA) 150 MG capsule Take 150 mg by mouth 3 (three) times daily.    . ranitidine (ZANTAC) 150 MG tablet Take 150 mg by mouth daily.    Marland Kitchen spironolactone (ALDACTONE) 25 MG tablet TAKE 1 TABLET BY MOUTH DAILY. 90 tablet 3  . torsemide (DEMADEX) 20 MG tablet TAKE 3 TABLETS BY MOUTH EVERY DAY 90 tablet 5  . VENTOLIN HFA 108 (90 BASE) MCG/ACT inhaler INHALE 2 PUFFS BY MOUTH EVERY 4 TO 6 HOURS AS NEEDED FOR FOR SHORTNESS OF BREATH OR WHEEZING 8.5 Inhaler 3  . VICTOZA 18 MG/3ML SOPN Inject 1.8 mg into the skin daily.     Current Facility-Administered Medications  Medication Dose Route Frequency Provider Last Rate Last Dose  . 0.9 %  sodium chloride infusion  500 mL Intravenous Continuous Danis, Starr Lake III, MD       BP (!) 144/90   Pulse 74   Wt 194 lb 12 oz (88.3 kg)   SpO2 100%   BMI 32.41 kg/m  General: NAD Neck: JVP 8-9 cm, goiter noted Lungs: Clear to auscultation bilaterally  with normal respiratory effort. CV: Nondisplaced PMI.  Heart regular S1/S2, no S3/S4, no murmur.  Trace edema.  No carotid bruit.  Normal pedal pulses.  Abdomen: Soft, nontender, no hepatosplenomegaly, no distention.  Skin: Intact without lesions or rashes.  Neurologic: Alert and oriented x 3.  Psych: Normal affect. Extremities: No clubbing or cyanosis.  HEENT: Normal.   Assessment/Plan: 1. Chronic systolic CHF: Nonischemic cardiomyopathy.  Possibly familial (sister had heart transplant).  CPX  showed mild to moderate functional limitation in 8/15.  Echo in 1/16 showed recovery of EF to 55-60%.  Echo repeated in 8/16 showed EF 55%, normal RV size and systolic function.  Echo in 7/18 showed EF down a bit to 45-50%.  She appears mildly volume overloaded on exam today. NYHA II-III symptoms, worse recently.  - Continue current Entresto, ivabradine, Coreg, and spironolactone.  - Increase torsemide to 80 mg daily with BMET today and in 2 wks.  - Need repeat echo to reassess LV function.  - With exertional chest pain as well as dyspnea, will assess for ischemia with ETT-Cardiolite.  2. Elevated transaminases: Chronic.  Abdominal US and serologies unrevealing.  Possible NASH. Mild RUQ tenderness.  3. HTN: Mild BP elevation, If EF has fallen, will add Bidil.  4. Goiter: Check TSH.  5. CAD: Coronary CT in 11/28 with mild stenosis in the proximal LAD, calcium score in the 97th percentile.  - With increased chest pain, will order ETT-Cardiolite.  - Check lipids, she should be on a statin given CAD.    Followup 2 months.    Dalton McLean,MD 11/18/2017

## 2017-11-22 ENCOUNTER — Telehealth (HOSPITAL_COMMUNITY): Payer: Self-pay | Admitting: *Deleted

## 2017-11-22 NOTE — Telephone Encounter (Signed)
Patient given detailed instructions per Myocardial Perfusion Study Information Sheet for the test on 11/25/17 at 1015. Patient notified to arrive 15 minutes early and that it is imperative to arrive on time for appointment to keep from having the test rescheduled.  If you need to cancel or reschedule your appointment, please call the office within 24 hours of your appointment. . Patient verbalized understanding.[name], [name]

## 2017-11-23 ENCOUNTER — Telehealth (HOSPITAL_COMMUNITY): Payer: Self-pay

## 2017-11-23 DIAGNOSIS — E785 Hyperlipidemia, unspecified: Secondary | ICD-10-CM

## 2017-11-23 MED ORDER — ROSUVASTATIN CALCIUM 5 MG PO TABS: 5.0000 mg | ORAL_TABLET | Freq: Every day | ORAL | 3 refills | Status: DC

## 2017-11-23 NOTE — Telephone Encounter (Signed)
Notes recorded by Teresa Coombs, RN on 11/23/2017 at 10:27 AM EDT Lipid Letter mailed to patient, and will get labs at f/u w/NP and agreeable to med changes (changes made in [facility])  [name], please start this patient on Crestor 5 mg daily given history of CAD by past coronary CTA and ongoing chest pain. LDL was > 70 when we checked it. ------  Notes recorded by Teresa Coombs, RN on 11/22/2017 at 3:50 PM EDT Left message to call back   ------  Notes recorded by Laurey Morale, MD on 11/22/2017 at 12:16 AM EDT Given known CAD, would start Crestor 10 mg daily. Lipids/LFTs in 2 months.

## 2017-11-25 ENCOUNTER — Other Ambulatory Visit: Payer: Medicare HMO | Admitting: *Deleted

## 2017-11-25 ENCOUNTER — Ambulatory Visit (HOSPITAL_BASED_OUTPATIENT_CLINIC_OR_DEPARTMENT_OTHER): Payer: Medicare HMO

## 2017-11-25 ENCOUNTER — Other Ambulatory Visit: Payer: Self-pay

## 2017-11-25 ENCOUNTER — Ambulatory Visit (HOSPITAL_COMMUNITY): Payer: Medicare HMO | Attending: Cardiology

## 2017-11-25 DIAGNOSIS — I5022 Chronic systolic (congestive) heart failure: Secondary | ICD-10-CM | POA: Insufficient documentation

## 2017-11-25 DIAGNOSIS — G4733 Obstructive sleep apnea (adult) (pediatric): Secondary | ICD-10-CM | POA: Insufficient documentation

## 2017-11-25 DIAGNOSIS — E785 Hyperlipidemia, unspecified: Secondary | ICD-10-CM | POA: Diagnosis not present

## 2017-11-25 DIAGNOSIS — I428 Other cardiomyopathies: Secondary | ICD-10-CM | POA: Insufficient documentation

## 2017-11-25 DIAGNOSIS — Z8249 Family history of ischemic heart disease and other diseases of the circulatory system: Secondary | ICD-10-CM | POA: Diagnosis not present

## 2017-11-25 DIAGNOSIS — R0600 Dyspnea, unspecified: Secondary | ICD-10-CM | POA: Diagnosis not present

## 2017-11-25 DIAGNOSIS — J45909 Unspecified asthma, uncomplicated: Secondary | ICD-10-CM | POA: Diagnosis not present

## 2017-11-25 DIAGNOSIS — E119 Type 2 diabetes mellitus without complications: Secondary | ICD-10-CM | POA: Diagnosis not present

## 2017-11-25 DIAGNOSIS — I110 Hypertensive heart disease with heart failure: Secondary | ICD-10-CM | POA: Insufficient documentation

## 2017-11-25 DIAGNOSIS — R002 Palpitations: Secondary | ICD-10-CM | POA: Insufficient documentation

## 2017-11-25 DIAGNOSIS — R079 Chest pain, unspecified: Secondary | ICD-10-CM | POA: Diagnosis not present

## 2017-11-25 LAB — ECHOCARDIOGRAM COMPLETE
HEIGHTINCHES: 65 in
Weight: 3104 oz

## 2017-11-25 LAB — BASIC METABOLIC PANEL
BUN/Creatinine Ratio: 17 (ref 9–23)
BUN: 13 mg/dL (ref 6–24)
CALCIUM: 8.8 mg/dL (ref 8.7–10.2)
CHLORIDE: 104 mmol/L (ref 96–106)
CO2: 25 mmol/L (ref 20–29)
Creatinine, Ser: 0.75 mg/dL (ref 0.57–1.00)
GFR calc Af Amer: 111 mL/min/{1.73_m2} (ref >59)
GFR calc non Af Amer: 96 mL/min/{1.73_m2} (ref >59)
GLUCOSE: 107 mg/dL — AB (ref 65–99)
Potassium: 3.9 mmol/L (ref 3.5–5.2)
SODIUM: 143 mmol/L (ref 134–144)

## 2017-11-25 LAB — MYOCARDIAL PERFUSION IMAGING
CHL CUP NUCLEAR SRS: 2
CHL CUP RESTING HR STRESS: 70 {beats}/min
CSEPPHR: 83 {beats}/min
LVDIAVOL: 114 mL (ref 46–106)
LVSYSVOL: 52 mL
NUC STRESS TID: 1.09
RATE: 0.34
SDS: 0
SSS: 2

## 2017-11-25 MED ORDER — REGADENOSON 0.4 MG/5ML IV SOLN
0.4000 mg | Freq: Once | INTRAVENOUS | Status: AC
  Administered 2017-11-25: 0.4 mg via INTRAVENOUS

## 2017-11-25 MED ORDER — TECHNETIUM TC 99M TETROFOSMIN IV KIT
10.3000 | PACK | Freq: Once | INTRAVENOUS | Status: AC | PRN
  Administered 2017-11-25: 10.3 via INTRAVENOUS
  Filled 2017-11-25: qty 11

## 2017-11-25 MED ORDER — TECHNETIUM TC 99M TETROFOSMIN IV KIT
30.5000 | PACK | Freq: Once | INTRAVENOUS | Status: AC | PRN
  Administered 2017-11-25: 30.5 via INTRAVENOUS
  Filled 2017-11-25: qty 31

## 2017-12-03 ENCOUNTER — Other Ambulatory Visit (HOSPITAL_COMMUNITY): Payer: Self-pay | Admitting: Internal Medicine

## 2017-12-03 DIAGNOSIS — I5022 Chronic systolic (congestive) heart failure: Secondary | ICD-10-CM

## 2017-12-06 ENCOUNTER — Other Ambulatory Visit (HOSPITAL_COMMUNITY): Payer: Self-pay | Admitting: *Deleted

## 2017-12-06 DIAGNOSIS — I5022 Chronic systolic (congestive) heart failure: Secondary | ICD-10-CM

## 2017-12-06 MED ORDER — TORSEMIDE 20 MG PO TABS: 80.0000 mg | ORAL_TABLET | Freq: Every day | ORAL | 1 refills | Status: DC

## 2017-12-08 DIAGNOSIS — E785 Hyperlipidemia, unspecified: Secondary | ICD-10-CM | POA: Diagnosis not present

## 2017-12-08 DIAGNOSIS — R945 Abnormal results of liver function studies: Secondary | ICD-10-CM | POA: Diagnosis not present

## 2017-12-08 DIAGNOSIS — Z1339 Encounter for screening examination for other mental health and behavioral disorders: Secondary | ICD-10-CM | POA: Diagnosis not present

## 2017-12-08 DIAGNOSIS — D509 Iron deficiency anemia, unspecified: Secondary | ICD-10-CM | POA: Diagnosis not present

## 2017-12-08 DIAGNOSIS — G4700 Insomnia, unspecified: Secondary | ICD-10-CM | POA: Diagnosis not present

## 2017-12-08 DIAGNOSIS — D510 Vitamin B12 deficiency anemia due to intrinsic factor deficiency: Secondary | ICD-10-CM | POA: Diagnosis not present

## 2017-12-08 DIAGNOSIS — I10 Essential (primary) hypertension: Secondary | ICD-10-CM | POA: Diagnosis not present

## 2017-12-08 DIAGNOSIS — E1143 Type 2 diabetes mellitus with diabetic autonomic (poly)neuropathy: Secondary | ICD-10-CM | POA: Diagnosis not present

## 2017-12-08 DIAGNOSIS — E559 Vitamin D deficiency, unspecified: Secondary | ICD-10-CM | POA: Diagnosis not present

## 2017-12-28 ENCOUNTER — Other Ambulatory Visit (HOSPITAL_COMMUNITY): Payer: Self-pay | Admitting: Student

## 2017-12-29 DIAGNOSIS — G4710 Hypersomnia, unspecified: Secondary | ICD-10-CM | POA: Diagnosis not present

## 2017-12-29 DIAGNOSIS — D510 Vitamin B12 deficiency anemia due to intrinsic factor deficiency: Secondary | ICD-10-CM | POA: Diagnosis not present

## 2017-12-29 DIAGNOSIS — E1140 Type 2 diabetes mellitus with diabetic neuropathy, unspecified: Secondary | ICD-10-CM | POA: Diagnosis not present

## 2017-12-29 DIAGNOSIS — Z6833 Body mass index (BMI) 33.0-33.9, adult: Secondary | ICD-10-CM | POA: Diagnosis not present

## 2017-12-29 DIAGNOSIS — Z1331 Encounter for screening for depression: Secondary | ICD-10-CM | POA: Diagnosis not present

## 2018-01-12 NOTE — Progress Notes (Signed)
Patient ID: [name], female   DOB: 07-10-1970, 47 y.o.   MRN: [id number] PCP: Dr. Larita [name] Cardiology: Dr. Cline Cools [name] is a 47 y.o. female with history of nonischemic cardiomyopathy. Cardiomyopathy was diagnosed back in 5/15.  Echo at that time showed EF 20-25%.  Her sister, of note, had a heart transplantation.  She was taken for RHC/LHC showed preserved cardiac output an no angiographic CAD.  CPX showed a mild to moderate functional limitation for her age.  Echo 1/16 improved with EF 55-60%, echo in 8/16 showed EF stable at 55%.  Echo in 7/18 with EF back down to 45-50%.    She presents today for regular follow up. She is feeling OK. She continues to get atypical chest pain 2-4 times weekly. No clear  Aggravating or relieving factors. It is a discomfort/pressure across her whole chest, or on her right side. She remains SOB walking up an incline, and at times when lying flat. She had occasional lightheadedness with rapid standing, but not marked or limiting. She also c/o more fatigue. She states she had a "borderline" sleep study several years ago. Taking all medications as directed. She is relieved that her cardiac tests have been normal thus far.   Echo 11/25/2017 LVEF 45-50%, Grade 2 DD, Mild LAE. Otherwise unremarkable.   Labs (10/15): BNP 52, ferritin normal Labs (11/15): K 4.3, creatinine 1.2, SPEP negative, TSH normal Labs (06/05/14): K 4.5 Creatinine 1.6 metolazone was stopped.  Labs (08/24/14): K 4.0, creatinine 1.1, TSH normal, LDL 104 Labs (1/17): K 3.8, creatinine 1.0, AST 214, ALT 142 Labs (5/17): AST 254, ALT 68 Labs (10/17): K 3.1, creatinine 0.76 Labs (1/18): hgb 12.1 Labs (7/18): TSH normal, ALT 100, AST 37, alkaline phosphatase 213 Labs (8/18): K 3, creatinine 0.71, BNP 20 Labs (10/18): hgb 11.5 Labs (3/19): K 3.3, creatinine 0.77  PMH: 1. Nonischemic cardiomyopathy: Echo (5/15) with EF 20-25%, diffuse hypokinesis, grade II diastolic dysfunction, normal RV  size and systolic function.  LHC/RHC (6/15) with no angiographic CAD; mean RA 11, PA 41/16 (mean 27), mean PCWP 15, CI 3.36, PVR 1.8.  CPX (8/15) with RER 1.14, peak VO2 13.7 (63% predicted), peak VO2 20.2 when adjusted for ideal body weight, VE/VCO2 slope 31 => mild to moderate functional limitation.  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 No history of ETOH or drug abuse.  SPEP and TSH negative, ferritin normal.  Echo 07/10/2014 with EF 55-60% Grade I DD.  Echo (8/16) with EF 55%, normal RV size and systolic function.  - Echo (7/18): EF 45-50%, grade II diastolic dysfunction.  - Nuclear stress 11/25/17 - Normal, low risk study.  2. Type II diabetes 3. HTN 4. Hyperlipidemia 5. Low back pain 6. Menorrhagia: s/p uterine ablation.  7. Bipolar disorder 8. Fibromyalgia 9. Migraines 10. Obesity: s/p gastric bypass.  11. Sleep study 6/16 with minimal OSA.  12. Anxiety 13. Elevated transaminases: Abdominal US with fatty liver.  AMA, ASMA, HCV, HBsAg negative.  14. Anemia 15. Holter (3/19): 2 short NSVT runs, rare PVCs/PACs.  16. CAD: Coronary CTA in 11/18 with mild stenosis proximal LAD, calcium score in the 97th percentile.   SH: Lives with mother in [place].  No smoking, ETOH, or drugs.   FH: Sister had heart transplant. She had another sister with CHF who died.  Uncle and grandfather with CHF. CAD runs in family.   Review of systems complete and found to be negative unless listed in HPI.    Current Outpatient Medications  Medication Sig Dispense  Refill  . Biotin 5000 MCG TABS Take 5,000 mcg by mouth daily.    Marland Kitchen buPROPion (WELLBUTRIN XL) 300 MG 24 hr tablet Take 300 mg by mouth daily before breakfast.     . busPIRone (BUSPAR) 15 MG tablet Take 15 mg by mouth 3 (three) times daily.    . carvedilol (COREG) 25 MG tablet Take 1 tablet (25 mg total) by mouth 2 (two) times daily with a meal. 60 tablet 6  . Cholecalciferol (VITAMIN D3) 5000 units TABS Take 5,000 Units by mouth daily.     . CORLANOR 5 MG  TABS tablet TAKE 1 TABLET BY MOUTH 2 (TWO) TIMES DAILY WITH A MEAL. 60 tablet 11  . cyclobenzaprine (FLEXERIL) 10 MG tablet Take 10 mg by mouth 3 (three) times daily as needed for muscle spasms.     . diclofenac sodium (VOLTAREN) 1 % GEL Apply 2 g topically 2 (two) times daily.    . DULoxetine (CYMBALTA) 60 MG capsule Take 60 mg by mouth daily.    Marland Kitchen ENTRESTO 97-103 MG TAKE 1 TABLET BY MOUTH 2 (TWO) TIMES DAILY. 60 tablet 6  . fluticasone (FLONASE) 50 MCG/ACT nasal spray Place 1 spray into both nostrils daily.     Marland Kitchen ibuprofen (ADVIL,MOTRIN) 800 MG tablet Take 800 mg by mouth every 8 (eight) hours as needed (pain).     . Insulin Glargine (LANTUS SOLOSTAR) 100 UNIT/ML Solostar Pen Inject 30 Units into the skin daily.     Marland Kitchen lidocaine (LIDODERM) 5 % Place 1 patch onto the skin daily as needed (for back pain).     . lubiprostone (AMITIZA) 24 MCG capsule Take 24 mcg by mouth 2 (two) times daily as needed for constipation.    . magnesium oxide (MAG-OX) 400 MG tablet TAKE 1 TABLET BY MOUTH DAILY 30 tablet 3  . oxyCODONE-acetaminophen (PERCOCET) 10-325 MG per tablet Take 0.5-1 tablets by mouth every 4 (four) hours as needed for pain.    . polyethylene glycol (MIRALAX / GLYCOLAX) packet Take 17 g by mouth 3 (three) times a week.    . pregabalin (LYRICA) 150 MG capsule Take 150 mg by mouth 3 (three) times daily.    . ranitidine (ZANTAC) 150 MG tablet Take 150 mg by mouth daily.    . rosuvastatin (CRESTOR) 5 MG tablet Take 1 tablet (5 mg total) by mouth daily. 90 tablet 3  . spironolactone (ALDACTONE) 25 MG tablet TAKE 1 TABLET BY MOUTH DAILY. 90 tablet 3  . torsemide (DEMADEX) 20 MG tablet Take 4 tablets (80 mg total) by mouth daily. 360 tablet 1  . VENTOLIN HFA 108 (90 BASE) MCG/ACT inhaler INHALE 2 PUFFS BY MOUTH EVERY 4 TO 6 HOURS AS NEEDED FOR FOR SHORTNESS OF BREATH OR WHEEZING 8.5 Inhaler 3  . VICTOZA 18 MG/3ML SOPN Inject 1.8 mg into the skin daily.    Marland Kitchen amLODipine (NORVASC) 10 MG tablet Take 1 tablet  (10 mg total) by mouth daily. 90 tablet 3  . potassium chloride (K-DUR) 10 MEQ tablet Take 4 tablets (40 mEq total) by mouth 3 (three) times daily. 320 tablet 6   Current Facility-Administered Medications  Medication Dose Route Frequency Provider Last Rate Last Dose  . 0.9 %  sodium chloride infusion  500 mL Intravenous Continuous Charlie Pitter III, MD       Vitals:   01/17/18 1115  BP: 140/82  Pulse: 69  SpO2: 98%  Weight: 195 lb 3.2 oz (88.5 kg)    Wt Readings from Last  3 Encounters:  01/17/18 195 lb 3.2 oz (88.5 kg)  11/25/17 194 lb (88 kg)  11/16/17 194 lb 12 oz (88.3 kg)    General: Well appearing. No resp difficulty. HEENT: Normal Neck: Supple. JVP 5-6. Carotids 2+ bilat; no bruits. Goiter noted. Cor: PMI nondisplaced. RRR, No M/G/R noted Lungs: CTAB, normal effort. Abdomen: Soft, non-tender, non-distended, no HSM. No bruits or masses. +BS  Extremities: No cyanosis, clubbing, or rash. R and LLE no edema.  Neuro: Alert & orientedx3, cranial nerves grossly intact. moves all 4 extremities w/o difficulty. Affect pleasant   Assessment/Plan: 1. Chronic systolic CHF:  - Nonischemic cardiomyopathy.   - Possibly familial (sister had heart transplant).  CPX showed mild to moderate functional limitation in 8/15.   - Echo 11/25/2017 LVEF 45-50%, Grade 2 DD, Mild LAE. Otherwise unremarkable.  - NYHA III symptoms, confounded by her multiple co-morbidities. - Volume status stable on exam.   - Continue torsemide 80 mg daily.  - Continue Entresto 97/103 mg BID - Continue coreg 25 mg BID - Continue spironolactone 25 mg daily. - Continue corlanor 5 mg BID.  - Reinforced fluid restriction to < 2 L daily, sodium restriction to less than 2000 mg daily, and the importance of daily weights.   2. Elevated transaminases: - Chronic.  Abdominal US and serologies unrevealing.  Possible NASH. Mild RUQ tenderness.  - No changes.  3. HTN:  - Meds as above.  4. Goiter:  - TSH normal 11/2017.   - TSH today with worsening fatigue.  5. CAD:  - Coronary CT in 11/28 with mild stenosis in the proximal LAD, calcium score in the 97th percentile.  - Normal ETT-Cardiolite 11/25/17 - Continue Crestor 10 mg daily.  - Normal Myoview 11/25/17. EF 54% - Check Lipids and LFTs  today. 6. Fatigue - She states she does not snore but had a "borderline" sleep study several years ago.  - Having a lot of daytime fatigue. Will refer for repeat sleep study.  7. Fibromyalgia - Likely contributor to her symptoms.  - Per PCP.   Labs as above. RTC 2 months, sooner with worsening symptoms.   Graciella Freer, PA-C  01/17/2018  Greater than 50% of the 30 minute visit was spent in counseling/coordination of care regarding disease state education, salt/fluid restriction, sliding scale diuretics, and medication compliance.

## 2018-01-17 ENCOUNTER — Ambulatory Visit (HOSPITAL_COMMUNITY)
Admission: RE | Admit: 2018-01-17 | Discharge: 2018-01-17 | Disposition: A | Discharge status: Home / Self Care | Payer: Medicare HMO | Source: Ambulatory Visit | Attending: Internal Medicine | Admitting: Internal Medicine

## 2018-01-17 ENCOUNTER — Encounter (HOSPITAL_COMMUNITY): Payer: Self-pay

## 2018-01-17 VITALS — BP 140/82 | HR 69 | Wt 195.2 lb

## 2018-01-17 DIAGNOSIS — I428 Other cardiomyopathies: Secondary | ICD-10-CM | POA: Diagnosis not present

## 2018-01-17 DIAGNOSIS — I10 Essential (primary) hypertension: Secondary | ICD-10-CM

## 2018-01-17 DIAGNOSIS — I25119 Atherosclerotic heart disease of native coronary artery with unspecified angina pectoris: Secondary | ICD-10-CM

## 2018-01-17 DIAGNOSIS — Z6832 Body mass index (BMI) 32.0-32.9, adult: Secondary | ICD-10-CM | POA: Diagnosis not present

## 2018-01-17 DIAGNOSIS — M797 Fibromyalgia: Secondary | ICD-10-CM | POA: Insufficient documentation

## 2018-01-17 DIAGNOSIS — I2510 Atherosclerotic heart disease of native coronary artery without angina pectoris: Secondary | ICD-10-CM | POA: Diagnosis not present

## 2018-01-17 DIAGNOSIS — R5383 Other fatigue: Secondary | ICD-10-CM

## 2018-01-17 DIAGNOSIS — E669 Obesity, unspecified: Secondary | ICD-10-CM | POA: Insufficient documentation

## 2018-01-17 DIAGNOSIS — Z794 Long term (current) use of insulin: Secondary | ICD-10-CM | POA: Diagnosis not present

## 2018-01-17 DIAGNOSIS — E785 Hyperlipidemia, unspecified: Secondary | ICD-10-CM | POA: Insufficient documentation

## 2018-01-17 DIAGNOSIS — Z9884 Bariatric surgery status: Secondary | ICD-10-CM | POA: Diagnosis not present

## 2018-01-17 DIAGNOSIS — I110 Hypertensive heart disease with heart failure: Secondary | ICD-10-CM | POA: Diagnosis not present

## 2018-01-17 DIAGNOSIS — E049 Nontoxic goiter, unspecified: Secondary | ICD-10-CM | POA: Insufficient documentation

## 2018-01-17 DIAGNOSIS — G4733 Obstructive sleep apnea (adult) (pediatric): Secondary | ICD-10-CM | POA: Insufficient documentation

## 2018-01-17 DIAGNOSIS — R0683 Snoring: Secondary | ICD-10-CM

## 2018-01-17 DIAGNOSIS — F319 Bipolar disorder, unspecified: Secondary | ICD-10-CM | POA: Insufficient documentation

## 2018-01-17 DIAGNOSIS — E119 Type 2 diabetes mellitus without complications: Secondary | ICD-10-CM | POA: Diagnosis not present

## 2018-01-17 DIAGNOSIS — I5022 Chronic systolic (congestive) heart failure: Secondary | ICD-10-CM | POA: Insufficient documentation

## 2018-01-17 DIAGNOSIS — F419 Anxiety disorder, unspecified: Secondary | ICD-10-CM | POA: Insufficient documentation

## 2018-01-17 DIAGNOSIS — Z79899 Other long term (current) drug therapy: Secondary | ICD-10-CM | POA: Diagnosis not present

## 2018-01-17 LAB — CBC
HEMATOCRIT: 40.7 % (ref 36.0–46.0)
Hemoglobin: 13.1 g/dL (ref 12.0–15.0)
MCH: 29.8 pg (ref 26.0–34.0)
MCHC: 32.2 g/dL (ref 30.0–36.0)
MCV: 92.7 fL (ref 78.0–100.0)
Platelets: 280 10*3/uL (ref 150–400)
RBC: 4.39 MIL/uL (ref 3.87–5.11)
RDW: 12.5 % (ref 11.5–15.5)
WBC: 7.3 10*3/uL (ref 4.0–10.5)

## 2018-01-17 LAB — COMPREHENSIVE METABOLIC PANEL
ALK PHOS: 132 U/L — AB (ref 38–126)
ALT: 38 U/L (ref 0–44)
AST: 40 U/L (ref 15–41)
Albumin: 3.7 g/dL (ref 3.5–5.0)
Anion gap: 10 (ref 5–15)
BILIRUBIN TOTAL: 0.9 mg/dL (ref 0.3–1.2)
BUN: 13 mg/dL (ref 6–20)
CALCIUM: 8.8 mg/dL — AB (ref 8.9–10.3)
CO2: 25 mmol/L (ref 22–32)
Chloride: 106 mmol/L (ref 98–111)
Creatinine, Ser: 0.87 mg/dL (ref 0.44–1.00)
GFR calc Af Amer: >60 mL/min (ref >60)
GFR calc non Af Amer: >60 mL/min (ref >60)
Glucose, Bld: 193 mg/dL — ABNORMAL HIGH (ref 70–99)
POTASSIUM: 3.5 mmol/L (ref 3.5–5.1)
Sodium: 141 mmol/L (ref 135–145)
TOTAL PROTEIN: 6.9 g/dL (ref 6.5–8.1)

## 2018-01-17 LAB — TSH: TSH: 1.506 u[IU]/mL (ref 0.350–4.500)

## 2018-01-17 LAB — LIPID PANEL
CHOL/HDL RATIO: 2.1 ratio
Cholesterol: 119 mg/dL (ref 0–200)
HDL: 56 mg/dL (ref >40)
LDL Cholesterol: 49 mg/dL (ref 0–99)
TRIGLYCERIDES: 68 mg/dL (ref <150)
VLDL: 14 mg/dL (ref 0–40)

## 2018-01-17 NOTE — Patient Instructions (Signed)
Routine lab work today. Will notify you of abnormal results, otherwise no news is good news!  No changes to medication at this time.  Will schedule you for sleep study at [facility]. Address: [address] [place], [place], [place] [address] Phone: [phone] Their office will call you directly to schedule.  Follow up 2 months with Dr. Shirlee Latch.  ___________________________________________________________________________ [name] Code: 1700  Take all medication as prescribed the day of your appointment. Bring all medications with you to your appointment.  Do the following things EVERYDAY: 1) Weigh yourself in the morning before breakfast. Write it down and keep it in a log. 2) Take your medicines as prescribed 3) Eat low salt foods-Limit salt (sodium) to 2000 mg per day.  4) Stay as active as you can everyday 5) Limit all fluids for the day to less than 2 liters

## 2018-01-18 DIAGNOSIS — M961 Postlaminectomy syndrome, not elsewhere classified: Secondary | ICD-10-CM | POA: Diagnosis not present

## 2018-01-18 DIAGNOSIS — Z6832 Body mass index (BMI) 32.0-32.9, adult: Secondary | ICD-10-CM | POA: Diagnosis not present

## 2018-01-18 DIAGNOSIS — M5416 Radiculopathy, lumbar region: Secondary | ICD-10-CM | POA: Diagnosis not present

## 2018-01-18 DIAGNOSIS — Z9689 Presence of other specified functional implants: Secondary | ICD-10-CM | POA: Diagnosis not present

## 2018-01-26 ENCOUNTER — Other Ambulatory Visit: Payer: Self-pay | Admitting: Family Medicine

## 2018-01-26 DIAGNOSIS — Z1231 Encounter for screening mammogram for malignant neoplasm of breast: Secondary | ICD-10-CM

## 2018-01-27 ENCOUNTER — Telehealth: Payer: Self-pay | Admitting: Hematology

## 2018-01-27 NOTE — Telephone Encounter (Signed)
Returned patient's call re needing an appointment with YF. Rescheduled 09/23/16 no show lab/fu. Gave patient new appointment for lab/fu 9/6 @ 12:30 pm.

## 2018-01-31 DIAGNOSIS — E538 Deficiency of other specified B group vitamins: Secondary | ICD-10-CM | POA: Diagnosis not present

## 2018-02-03 DIAGNOSIS — H5213 Myopia, bilateral: Secondary | ICD-10-CM | POA: Diagnosis not present

## 2018-02-03 DIAGNOSIS — H52209 Unspecified astigmatism, unspecified eye: Secondary | ICD-10-CM | POA: Diagnosis not present

## 2018-02-03 DIAGNOSIS — H524 Presbyopia: Secondary | ICD-10-CM | POA: Diagnosis not present

## 2018-02-07 ENCOUNTER — Ambulatory Visit
Admission: RE | Admit: 2018-02-07 | Discharge: 2018-02-07 | Disposition: A | Discharge status: Home / Self Care | Payer: Medicare HMO | Source: Ambulatory Visit | Attending: Family Medicine | Admitting: Family Medicine

## 2018-02-07 DIAGNOSIS — Z1231 Encounter for screening mammogram for malignant neoplasm of breast: Secondary | ICD-10-CM | POA: Diagnosis not present

## 2018-02-08 ENCOUNTER — Other Ambulatory Visit (HOSPITAL_COMMUNITY): Payer: Self-pay | Admitting: Adult Health

## 2018-02-08 DIAGNOSIS — I5022 Chronic systolic (congestive) heart failure: Secondary | ICD-10-CM

## 2018-02-18 ENCOUNTER — Inpatient Hospital Stay: Payer: Medicare HMO

## 2018-02-18 ENCOUNTER — Inpatient Hospital Stay: Payer: Medicare HMO | Attending: Hematology | Admitting: Hematology

## 2018-02-19 ENCOUNTER — Other Ambulatory Visit (HOSPITAL_COMMUNITY): Payer: Self-pay | Admitting: Student

## 2018-02-22 ENCOUNTER — Telehealth: Payer: Self-pay | Admitting: Hematology

## 2018-02-22 NOTE — Telephone Encounter (Signed)
Patient called to reschedule  °

## 2018-02-25 DIAGNOSIS — H3582 Retinal ischemia: Secondary | ICD-10-CM | POA: Diagnosis not present

## 2018-02-25 DIAGNOSIS — E113593 Type 2 diabetes mellitus with proliferative diabetic retinopathy without macular edema, bilateral: Secondary | ICD-10-CM | POA: Diagnosis not present

## 2018-02-25 DIAGNOSIS — H2513 Age-related nuclear cataract, bilateral: Secondary | ICD-10-CM | POA: Diagnosis not present

## 2018-02-25 DIAGNOSIS — H4312 Vitreous hemorrhage, left eye: Secondary | ICD-10-CM | POA: Diagnosis not present

## 2018-03-04 DIAGNOSIS — E113593 Type 2 diabetes mellitus with proliferative diabetic retinopathy without macular edema, bilateral: Secondary | ICD-10-CM | POA: Diagnosis not present

## 2018-03-23 ENCOUNTER — Other Ambulatory Visit (HOSPITAL_COMMUNITY): Payer: Self-pay | Admitting: Internal Medicine

## 2018-03-23 DIAGNOSIS — D649 Anemia, unspecified: Secondary | ICD-10-CM

## 2018-03-23 DIAGNOSIS — I5022 Chronic systolic (congestive) heart failure: Secondary | ICD-10-CM

## 2018-03-23 DIAGNOSIS — R531 Weakness: Secondary | ICD-10-CM

## 2018-03-25 DIAGNOSIS — E113592 Type 2 diabetes mellitus with proliferative diabetic retinopathy without macular edema, left eye: Secondary | ICD-10-CM | POA: Diagnosis not present

## 2018-03-28 ENCOUNTER — Inpatient Hospital Stay (HOSPITAL_BASED_OUTPATIENT_CLINIC_OR_DEPARTMENT_OTHER): Payer: Medicare HMO | Admitting: Hematology

## 2018-03-28 ENCOUNTER — Ambulatory Visit (HOSPITAL_COMMUNITY)
Admission: RE | Admit: 2018-03-28 | Discharge: 2018-03-28 | Disposition: A | Discharge status: Home / Self Care | Payer: Medicare HMO | Source: Ambulatory Visit | Attending: Cardiology | Admitting: Cardiology

## 2018-03-28 ENCOUNTER — Telehealth: Payer: Self-pay | Admitting: Hematology

## 2018-03-28 ENCOUNTER — Encounter: Payer: Self-pay | Admitting: Hematology

## 2018-03-28 ENCOUNTER — Inpatient Hospital Stay: Payer: Medicare HMO | Attending: Hematology

## 2018-03-28 VITALS — BP 130/75 | HR 63 | Wt 197.8 lb

## 2018-03-28 DIAGNOSIS — E785 Hyperlipidemia, unspecified: Secondary | ICD-10-CM | POA: Diagnosis not present

## 2018-03-28 DIAGNOSIS — D500 Iron deficiency anemia secondary to blood loss (chronic): Secondary | ICD-10-CM | POA: Diagnosis not present

## 2018-03-28 DIAGNOSIS — I2510 Atherosclerotic heart disease of native coronary artery without angina pectoris: Secondary | ICD-10-CM | POA: Insufficient documentation

## 2018-03-28 DIAGNOSIS — Z9884 Bariatric surgery status: Secondary | ICD-10-CM | POA: Diagnosis not present

## 2018-03-28 DIAGNOSIS — Z7901 Long term (current) use of anticoagulants: Secondary | ICD-10-CM | POA: Insufficient documentation

## 2018-03-28 DIAGNOSIS — N920 Excessive and frequent menstruation with regular cycle: Secondary | ICD-10-CM

## 2018-03-28 DIAGNOSIS — E119 Type 2 diabetes mellitus without complications: Secondary | ICD-10-CM | POA: Insufficient documentation

## 2018-03-28 DIAGNOSIS — E049 Nontoxic goiter, unspecified: Secondary | ICD-10-CM | POA: Insufficient documentation

## 2018-03-28 DIAGNOSIS — F319 Bipolar disorder, unspecified: Secondary | ICD-10-CM | POA: Insufficient documentation

## 2018-03-28 DIAGNOSIS — I5022 Chronic systolic (congestive) heart failure: Secondary | ICD-10-CM

## 2018-03-28 DIAGNOSIS — Z79891 Long term (current) use of opiate analgesic: Secondary | ICD-10-CM | POA: Insufficient documentation

## 2018-03-28 DIAGNOSIS — I509 Heart failure, unspecified: Secondary | ICD-10-CM

## 2018-03-28 DIAGNOSIS — Z79899 Other long term (current) drug therapy: Secondary | ICD-10-CM | POA: Insufficient documentation

## 2018-03-28 DIAGNOSIS — I10 Essential (primary) hypertension: Secondary | ICD-10-CM

## 2018-03-28 DIAGNOSIS — D649 Anemia, unspecified: Secondary | ICD-10-CM | POA: Diagnosis not present

## 2018-03-28 DIAGNOSIS — I428 Other cardiomyopathies: Secondary | ICD-10-CM | POA: Insufficient documentation

## 2018-03-28 DIAGNOSIS — M797 Fibromyalgia: Secondary | ICD-10-CM | POA: Insufficient documentation

## 2018-03-28 DIAGNOSIS — Z8249 Family history of ischemic heart disease and other diseases of the circulatory system: Secondary | ICD-10-CM | POA: Insufficient documentation

## 2018-03-28 DIAGNOSIS — Z791 Long term (current) use of non-steroidal anti-inflammatories (NSAID): Secondary | ICD-10-CM | POA: Insufficient documentation

## 2018-03-28 DIAGNOSIS — R002 Palpitations: Secondary | ICD-10-CM

## 2018-03-28 DIAGNOSIS — Z794 Long term (current) use of insulin: Secondary | ICD-10-CM | POA: Insufficient documentation

## 2018-03-28 DIAGNOSIS — E669 Obesity, unspecified: Secondary | ICD-10-CM | POA: Insufficient documentation

## 2018-03-28 DIAGNOSIS — R5383 Other fatigue: Secondary | ICD-10-CM

## 2018-03-28 DIAGNOSIS — I110 Hypertensive heart disease with heart failure: Secondary | ICD-10-CM | POA: Insufficient documentation

## 2018-03-28 DIAGNOSIS — G4733 Obstructive sleep apnea (adult) (pediatric): Secondary | ICD-10-CM | POA: Diagnosis not present

## 2018-03-28 LAB — CBC WITH DIFFERENTIAL/PLATELET
ABS IMMATURE GRANULOCYTES: 0.01 10*3/uL (ref 0.00–0.07)
BASOS ABS: 0 10*3/uL (ref 0.0–0.1)
BASOS PCT: 1 %
Eosinophils Absolute: 0.3 10*3/uL (ref 0.0–0.5)
Eosinophils Relative: 4 %
HCT: 37.6 % (ref 36.0–46.0)
HEMOGLOBIN: 12.3 g/dL (ref 12.0–15.0)
IMMATURE GRANULOCYTES: 0 %
Lymphocytes Relative: 24 %
Lymphs Abs: 1.8 10*3/uL (ref 0.7–4.0)
MCH: 29.8 pg (ref 26.0–34.0)
MCHC: 32.7 g/dL (ref 30.0–36.0)
MCV: 91 fL (ref 80.0–100.0)
Monocytes Absolute: 0.5 10*3/uL (ref 0.1–1.0)
Monocytes Relative: 6 %
Neutro Abs: 5.1 10*3/uL (ref 1.7–7.7)
Neutrophils Relative %: 65 %
PLATELETS: 230 10*3/uL (ref 150–400)
RBC: 4.13 MIL/uL (ref 3.87–5.11)
RDW: 11.9 % (ref 11.5–15.5)
WBC: 7.7 10*3/uL (ref 4.0–10.5)
nRBC: 0 % (ref 0.0–0.2)

## 2018-03-28 LAB — MAGNESIUM: MAGNESIUM: 1.9 mg/dL (ref 1.7–2.4)

## 2018-03-28 LAB — BASIC METABOLIC PANEL
ANION GAP: 8 (ref 5–15)
BUN: 12 mg/dL (ref 6–20)
CO2: 26 mmol/L (ref 22–32)
Calcium: 8.6 mg/dL — ABNORMAL LOW (ref 8.9–10.3)
Chloride: 108 mmol/L (ref 98–111)
Creatinine, Ser: 0.77 mg/dL (ref 0.44–1.00)
Glucose, Bld: 114 mg/dL — ABNORMAL HIGH (ref 70–99)
POTASSIUM: 3.2 mmol/L — AB (ref 3.5–5.1)
Sodium: 142 mmol/L (ref 135–145)

## 2018-03-28 NOTE — Patient Instructions (Signed)
Labs done today  Your physician has recommended that you have a sleep study. This test records several body functions during sleep, including: brain activity, eye movement, oxygen and carbon dioxide blood levels, heart rate and rhythm, breathing rate and rhythm, the flow of air through your mouth and nose, snoring, body muscle movements, and chest and belly movement.  Your provider has recommended that  you wear a Zio Patch for 7 days.  This monitor will record your heart rhythm for our review.  IF you have any symptoms while wearing the monitor please press the button.  If you have any issues with the patch or you notice a red or orange light on it please call the company at [phone].  Once you remove the patch please mail it back to the company as soon as possible so we can get the results.  Your physician recommends that you schedule a follow-up appointment in: 3 months

## 2018-03-28 NOTE — Progress Notes (Signed)
Patient ID: [name], female   DOB: 08/27/1970, 47 y.o.   MRN: [id number]  PCP: Dr. Omer Jack  Cardiology: Dr. Cline Cools [name] is a 47 y.o. female with history of nonischemic cardiomyopathy. Cardiomyopathy was diagnosed back in 5/15.  Echo at that time showed EF 20-25%.  Her sister, of note, had a heart transplantation.  She was taken for RHC/LHC showed preserved cardiac output an no angiographic CAD.  CPX showed a mild to moderate functional limitation for her age.  Echo 1/16 improved with EF 55-60%, echo in 8/16 showed EF stable at 55%.  Echo in 7/18 with EF back down to 45-50%.    Today she returns for 2 month follow up. Overall feeling fair. She has ongoing fatigue and joint pain. Also complaining of anxiety and difficulty sleeping. Denies PND/Orthopnea. Having palpitations 2-3 times a week. Palpitations can last 2-3 minutes. Trying to walk some. Appetite ok. Having ongoing dumping syndrome form gastric bypass. No fever or chills. Weight at home 197-199 pounds. Taking all medications.   Echo 11/25/2017 LVEF 45-50%, Grade 2 DD, Mild LAE. Otherwise unremarkable.   Labs (10/15): BNP 52, ferritin normal Labs (11/15): K 4.3, creatinine 1.2, SPEP negative, TSH normal Labs (06/05/14): K 4.5 Creatinine 1.6 metolazone was stopped.  Labs (08/24/14): K 4.0, creatinine 1.1, TSH normal, LDL 104 Labs (1/17): K 3.8, creatinine 1.0, AST 214, ALT 142 Labs (5/17): AST 254, ALT 68 Labs (10/17): K 3.1, creatinine 0.76 Labs (1/18): hgb 12.1 Labs (7/18): TSH normal, ALT 100, AST 37, alkaline phosphatase 213 Labs (8/18): K 3, creatinine 0.71, BNP 20 Labs (10/18): hgb 11.5 Labs (3/19): K 3.3, creatinine 0.77 Labs (03/28/2018) : Hgb 12.3   PMH: 1. Nonischemic cardiomyopathy: Echo (5/15) with EF 20-25%, diffuse hypokinesis, grade II diastolic dysfunction, normal RV size and systolic function.  LHC/RHC (6/15) with no angiographic CAD; mean RA 11, PA 41/16 (mean 27), mean PCWP 15, CI 3.36, PVR 1.8.  CPX  (8/15) with RER 1.14, peak VO2 13.7 (63% predicted), peak VO2 20.2 when adjusted for ideal body weight, VE/VCO2 slope 31 => mild to moderate functional limitation.  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 No history of ETOH or drug abuse.  SPEP and TSH negative, ferritin normal.  Echo 07/10/2014 with EF 55-60% Grade I DD.  Echo (8/16) with EF 55%, normal RV size and systolic function.  - Echo (7/18): EF 45-50%, grade II diastolic dysfunction.  - Nuclear stress 11/25/17 - Normal, low risk study.  2. Type II diabetes 3. HTN 4. Hyperlipidemia 5. Low back pain 6. Menorrhagia: s/p uterine ablation.  7. Bipolar disorder 8. Fibromyalgia 9. Migraines 10. Obesity: s/p gastric bypass.  11. Sleep study 6/16 with minimal OSA.  12. Anxiety 13. Elevated transaminases: Abdominal US with fatty liver.  AMA, ASMA, HCV, HBsAg negative.  14. Anemia 15. Holter (3/19): 2 short NSVT runs, rare PVCs/PACs.  16. CAD: Coronary CTA in 11/18 with mild stenosis proximal LAD, calcium score in the 97th percentile.   SH: Lives with mother in [place].  No smoking, ETOH, or drugs.   FH: Sister had heart transplant. She had another sister with CHF who died.  Uncle and grandfather with CHF. CAD runs in family.   Review of systems complete and found to be negative unless listed in HPI.    Current Outpatient Medications  Medication Sig Dispense Refill  . amLODipine (NORVASC) 10 MG tablet Take 1 tablet (10 mg total) by mouth daily. 90 tablet 3  . Biotin 5000 MCG TABS Take 5,000  mcg by mouth daily.    Marland Kitchen buPROPion (WELLBUTRIN XL) 300 MG 24 hr tablet Take 300 mg by mouth daily before breakfast.     . busPIRone (BUSPAR) 15 MG tablet Take 15 mg by mouth 3 (three) times daily.    . carvedilol (COREG) 25 MG tablet TAKE 1 TABLET BY MOUTH TWICE A DAY WITH MEALS 60 tablet 11  . Cholecalciferol (VITAMIN D3) 5000 units TABS Take 5,000 Units by mouth daily.     . CORLANOR 5 MG TABS tablet TAKE 1 TABLET BY MOUTH 2 (TWO) TIMES DAILY WITH A MEAL.  60 tablet 9  . cyclobenzaprine (FLEXERIL) 10 MG tablet Take 10 mg by mouth 3 (three) times daily as needed for muscle spasms.     . diclofenac sodium (VOLTAREN) 1 % GEL Apply 2 g topically 2 (two) times daily.    . DULoxetine (CYMBALTA) 60 MG capsule Take 60 mg by mouth daily.    Marland Kitchen ENTRESTO 97-103 MG TAKE 1 TABLET BY MOUTH 2 (TWO) TIMES DAILY. 60 tablet 6  . fluticasone (FLONASE) 50 MCG/ACT nasal spray Place 1 spray into both nostrils daily.     Marland Kitchen ibuprofen (ADVIL,MOTRIN) 800 MG tablet Take 800 mg by mouth every 8 (eight) hours as needed (pain).     . Insulin Glargine (LANTUS SOLOSTAR) 100 UNIT/ML Solostar Pen Inject 30 Units into the skin daily.     Marland Kitchen lidocaine (LIDODERM) 5 % Place 1 patch onto the skin daily as needed (for back pain).     . lubiprostone (AMITIZA) 24 MCG capsule Take 24 mcg by mouth 2 (two) times daily as needed for constipation.    . magnesium oxide (MAG-OX) 400 MG tablet TAKE 1 TABLET BY MOUTH DAILY 30 tablet 3  . oxyCODONE-acetaminophen (PERCOCET) 10-325 MG per tablet Take 0.5-1 tablets by mouth every 4 (four) hours as needed for pain.    . polyethylene glycol (MIRALAX / GLYCOLAX) packet Take 17 g by mouth 3 (three) times a week.    . potassium chloride (K-DUR) 10 MEQ tablet TAKE 4 TABLETS (40 MEQ TOTAL) BY MOUTH 3 (THREE) TIMES DAILY. 320 tablet 6  . pregabalin (LYRICA) 150 MG capsule Take 150 mg by mouth 3 (three) times daily.    . ranitidine (ZANTAC) 150 MG tablet Take 150 mg by mouth daily.    . rosuvastatin (CRESTOR) 5 MG tablet Take 1 tablet (5 mg total) by mouth daily. 90 tablet 3  . spironolactone (ALDACTONE) 25 MG tablet TAKE 1 TABLET BY MOUTH DAILY. 90 tablet 3  . torsemide (DEMADEX) 20 MG tablet Take 4 tablets (80 mg total) by mouth daily. 360 tablet 1  . VENTOLIN HFA 108 (90 BASE) MCG/ACT inhaler INHALE 2 PUFFS BY MOUTH EVERY 4 TO 6 HOURS AS NEEDED FOR FOR SHORTNESS OF BREATH OR WHEEZING 8.5 Inhaler 3  . VICTOZA 18 MG/3ML SOPN Inject 1.8 mg into the skin daily.       Current Facility-Administered Medications  Medication Dose Route Frequency Provider Last Rate Last Dose  . 0.9 %  sodium chloride infusion  500 mL Intravenous Continuous Charlie Pitter III, MD       Vitals:   03/28/18 1043  BP: 130/75  Pulse: 63  SpO2: 95%  Weight: 89.7 kg (197 lb 12.8 oz)    Wt Readings from Last 3 Encounters:  03/28/18 89.7 kg (197 lb 12.8 oz)  01/17/18 88.5 kg (195 lb 3.2 oz)  11/25/17 88 kg (194 lb)    General:   No resp  difficulty HEENT: normal  Neck: supple. Goiter. no JVD. Carotids 2+ bilat; no bruits. No lymphadenopathy or thryomegaly appreciated. Cor: PMI nondisplaced. Regular rate & rhythm. No rubs, gallops or murmurs. Lungs: clear Abdomen: soft, nontender, nondistended. No hepatosplenomegaly. No bruits or masses. Good bowel sounds. Extremities: no cyanosis, clubbing, rash, edema Neuro: alert & orientedx3, cranial nerves grossly intact. moves all 4 extremities w/o difficulty. Affect pleasant  EKG: NSR 69 bpm   Assessment/Plan: 1. Chronic systolic CHF:  - Nonischemic cardiomyopathy.   - Possibly familial (sister had heart transplant).  CPX showed mild to moderate functional limitation in 8/15.   - Echo 11/25/2017 LVEF 45-50%, Grade 2 DD, Mild LAE. Otherwise unremarkable.  -NYHA II-III. Volume status stable.  - Continue torsemide 80 mg daily.  - Continue Entresto 97/103 mg BID - Continue coreg 25 mg BID - Continue spironolactone 25 mg daily. - Continue corlanor 5 mg BID.  -Check BMET today -  2. Elevated transaminases: - Chronic.  Abdominal US and serologies unrevealing.  Possible NASH. - No changes  3. HTN:  - Meds as above.  - stable  4. Goiter:  - TSH 1.45 on 8.5 per PCP.  5. CAD:  - Coronary CT in 11/28 with mild stenosis in the proximal LAD, calcium score in the 97th percentile.  - Normal ETT-Cardiolite 11/25/17 - Continue Crestor 10 mg daily.  - Normal Myoview 11/25/17. EF 54% - No chest pain.  6. Fatigue -  Multifactorial.  -  Send for sleep study.  7. Fibromyalgia - Likely contributor to her symptoms.  - Per PCP.  8. Anemia  Followed by hematology. Last received IV iron in 2016. Hgb today 12.3. Anemia panel pending.  9. Palpitations Place Zio Patch to assess for arrhythmias. ? Anxiety.   Follow up in 3 months with Dr Shirlee Latch.   Tonye Becket, NP  03/28/2018

## 2018-03-28 NOTE — Progress Notes (Signed)
[facility] HEMATOLOGY OFFICE PROGRESS NOTE Date of visit: 03/28/2018   Ronal Fear, NP [address] [place] [address]  DIAGNOSIS: Iron deficiency anemia due to chronic blood loss  CHIEF COMPLAIN: follow up anemia   CURRENT TREATMENT:  Feraheme 1,020 mg given  On 10/12/13 and 03/20/2014, 510mg  08/29/14  INTERVAL HISTORY:   [name] 47 y.o. female with a history of IDA secondary to heavy menses and also a history of gastric bypass is here for follow up.  She was last seen by me in 12/2015. In interim she was seen by NP Lacie in 03/2017.  She presents to the clinic today alone. She is doing well but complains of back pain. She stated that she had eye surgery for retinopathy and she is doing well now. She is compliant with her Vitamin b 12 injections. Complains of fatigue, but she is capable of performing daily activities.  She follows up with her Cardiologist, and states that she states that she has no had a cycle for 3-4 years, but started having light  cycles again 2-3 months ago. She had endometrial ablation, but is not following up with her OB/GYN.    MEDICAL HISTORY: Past Medical History:  Diagnosis Date  . Anemia    hx of iron transfusions - last one 10/2013   . Anxiety   . Arthritis    neck and shoulders   . Asthma   . Back pain, chronic   . Bipolar disorder (HCC)   . Carpal tunnel syndrome on both sides   . Cataract   . Depression   . Diabetes mellitus   . Fibromyalgia   . GERD (gastroesophageal reflux disease)   . History of gastric bypass Jan 2013  . Hyperlipidemia   . Hypertension   . Insomnia   . Migraines   . Nervous breakdown sept 2013  . Neuropathy   . Nonischemic cardiomyopathy (HCC)   . OSA (obstructive sleep apnea) 02/21/2015  . Peripheral vascular disease (HCC)   . Shortness of breath    with exertion   . Sleep apnea    borderline- no cpap yet  . Systolic CHF, acute on chronic (HCC)    CARDIOLOGIST-- DR Aris Lot NELSON   ([facility])  . Wound of left breast     INTERIM HISTORY: has Morbid obesity (HCC); IDDM (insulin dependent diabetes mellitus) (HCC); Degenerative disc disease; Nausea and vomiting; Major depressive disorder, recurrent episode, moderate (HCC); Compulsive behavior disorder (HCC); History of gastric bypass; Anxiety disorder; Nervous breakdown; Hematochezia; Iron deficiency anemia due to chronic blood loss; DM type 2 (diabetes mellitus, type 2) (HCC); Generalized weakness; Generalized abdominal pain; Hyperactive bowel sounds; Dehydration; Insomnia; Diabetic neuropathy (HCC); Migraine, unspecified, without mention of intractable migraine without mention of status migrainosus; History of iron deficiency anemia; Chronic systolic heart failure (HCC); Left breast abscess; Breast injury; Open breast wound; Chronic systolic CHF (congestive heart failure) (HCC); Goiter; Fatigue; Excessive daytime sleepiness; Snoring; OSA (obstructive sleep apnea); Elevated LFTs; Palpitations; and Hypokalemia on their problem list.    ALLERGIES:  is allergic to imitrex [sumatriptan].  MEDICATIONS: has a current medication list which includes the following prescription(s): alum & mag hydroxide-simeth, biotin, bupropion, carvedilol, vitamin d3, cyclobenzaprine, diclofenac sodium, duloxetine, fluticasone, ibuprofen, insulin glargine, insulin glulisine, ivabradine, lidocaine, liraglutide, lubiprostone, oxycodone-acetaminophen, polyethylene glycol, potassium chloride sa, pregabalin, ranitidine, sacubitril-valsartan, spironolactone, torsemide, and ventolin hfa.  SURGICAL HISTORY:  Past Surgical History:  Procedure Laterality Date  . BACK SURGERY  November 1992  . BREAST  SURGERY Left    I&D for abcsess   . CARDIAC CATHETERIZATION  11-14-2013  DR Marca Ancona   NORMAL LV FILLING PRESSURE W/ MILD ELEVATED RV FILLING, POSSIBLY SUFFESTING RV FAILURE OUT OF PROPORTION TO LV FAILURE. EF ABOUT 35% WITH DIFFUSE HYPOKINESIS. NO ANGIOGRAPHIC  CAD, NONISCHEMIC CARDIOMYOPATHY  . CESAREAN SECTION  December 1998  . CHOLECYSTECTOMY  1998  . DILITATION & CURRETTAGE/HYSTROSCOPY WITH NOVASURE ABLATION N/A 02/26/2014   Procedure: DILATATION & CURETTAGE/HYSTEROSCOPY WITH NOVASURE ABLATION;  Surgeon: Meriel Pica, MD;  Location: [facility];  Service: Gynecology;  Laterality: N/A;  . ESOPHAGOGASTRODUODENOSCOPY  07/27/2011   Procedure: ESOPHAGOGASTRODUODENOSCOPY (EGD);  Surgeon: Freddy Jaksch, MD;  Location: [facility];  Service: Endoscopy;  Laterality: N/A;  . GASTRIC ROUX-EN-Y  06/30/2011   Procedure: LAPAROSCOPIC ROUX-EN-Y GASTRIC;  Surgeon: Mariella Saa, MD;  Location: [facility];  Service: General;  Laterality: N/A;     . INCISION AND DRAINAGE OF WOUND Left 12/14/2013   Procedure: IRRIGATION AND DEBRIDEMENT OF LEFT BREAST WOUND WITH PLACEMENT OF A-CELL, WITH CLOSURE ;  Surgeon: Wayland Denis, DO;  Location: [facility];  Service: Plastics;  Laterality: Left;  . IRRIGATION AND DEBRIDEMENT ABSCESS Left 11/26/2013   Procedure: IRRIGATION AND DEBRIDEMENT ABSCESS;  Surgeon: Velora Heckler, MD;  Location: [facility];  Service: General;  Laterality: Left;  . LEFT AND RIGHT HEART CATHETERIZATION WITH CORONARY ANGIOGRAM N/A 11/14/2013   Procedure: LEFT AND RIGHT HEART CATHETERIZATION WITH CORONARY ANGIOGRAM;  Surgeon: Laurey Morale, MD;  Location: [facility] CATH LAB;  Service: Cardiovascular;  Laterality: N/A;  . SPINAL CORD STIMULATOR INSERTION N/A 03/09/2014   Procedure: LUMBAR SPINAL CORD STIMULATOR INSERTION;  Surgeon: Gwynne Edinger, MD;  Location: [facility];  Service: Neurosurgery;  Laterality: N/A;  . TUBAL LIGATION  January 1999    REVIEW OF SYSTEMS:   Constitutional: Denies fevers, chills or abnormal weight loss; (+) fatigue Eyes: Denies blurriness of vision Ears, nose, mouth, throat, and face: Denies mucositis or sore throat Respiratory: Denies cough, dyspnea or wheezes Cardiovascular: Denies palpitation, chest discomfort or lower extremity  swelling Gastrointestinal:  Denies nausea, heartburn or change in bowel habits Skin: Denies abnormal skin rashes Lymphatics: Denies new lymphadenopathy or easy bruising Neurological:Denies numbness, tingling or new weaknesses Behavioral/Psych: Mood is stable, no new changes  All other systems were reviewed with the patient and are negative.  PHYSICAL EXAMINATION: ECOG PERFORMANCE STATUS: 1 There were no vitals taken for this visit.   GENERAL:alert, no distress and comfortable; moderately obese SKIN: skin color, texture, turgor are normal, no rashes or significant lesions; multiple scarred lesion on lower extremity.  EYES: normal, Conjunctiva are pink and non-injected, sclera clear OROPHARYNX:no exudate, no erythema and lips, buccal mucosa, and tongue normal  NECK: supple, thyroid normal size, non-tender, without nodularity LYMPH:  no palpable lymphadenopathy in the cervical, axillary or supraclavicular BREASTS: No examined.  LUNGS: clear to auscultation with normal breathing effort, no wheezes or rhonchi HEART: regular rate & rhythm and no murmurs and no lower extremity edema ABDOMEN:abdomen soft, non-tender and normal bowel sounds Musculoskeletal:no cyanosis of digits and no clubbing  NEURO: alert & oriented x 3 with fluent speech, no focal motor/sensory deficits  Labs:   CBC Latest Ref Rng & Units 01/17/2018 03/24/2017 02/01/2017  WBC 4.0 - 10.5 K/uL 7.3 8.0 7.8  Hemoglobin 12.0 - 15.0 g/dL 58.5 11.5(L) 13.4  Hematocrit 36.0 - 46.0 % 40.7 34.5(L) 39.2  Platelets 150 - 400 K/uL 280 235 327   CMP Latest Ref Rng &  Units 01/17/2018 11/25/2017 11/16/2017  Glucose 70 - 99 mg/dL 696(E) 952(W) 413(K)  BUN 6 - 20 mg/dL 13 13 13   Creatinine 0.44 - 1.00 mg/dL 4.40 1.02 7.25  Sodium 135 - 145 mmol/L 141 143 140  Potassium 3.5 - 5.1 mmol/L 3.5 3.9 3.5  Chloride 98 - 111 mmol/L 106 104 107  CO2 22 - 32 mmol/L 25 25 23   Calcium 8.9 - 10.3 mg/dL 3.6(U) 8.8 4.4(I)  Total Protein 6.5 - 8.1 g/dL 6.9  - -  Total Bilirubin 0.3 - 1.2 mg/dL 0.9 - -  Alkaline Phos 38 - 126 U/L 132(H) - -  AST 15 - 41 U/L 40 - -  ALT 0 - 44 U/L 38 - -     PROCEDURES  Upper Endoscopy by Dr. Myrtie Neither 03/13/16  IMPRESSION - Normal esophagus. - Gastric bypass with a normal-sized pouch. Gastrojejunal anastomosis characterized by healthy appearing mucosa. - Normal examined jejunum. - Normal larynx. - No specimens collected.   RADIOGRAPHIC STUDIES: None   ASSESSMENT: [name] 47 y.o. female with a history of Iron deficiency anemia due to chronic blood loss   1.  H.o. severe IDA secondary to gastric bypass and menorrhagia   -Previously, her lab was consistent with iron deficiency, secondary to gastric bypass and menorrhagia. -She has had endometrial ablation in August 2015, no menstrual period since then until 3-4 months ago, she has regular period but it only lasts about two days. -She previously responded to IV iron very well, her hemoglobin has remained to be normal. -She did not respond well to oral iron, and does not want try it anymore. -Received IV feraheme 10/12/2013, 03/20/2014, 08/29/2014 -Labs reviewed, CBC is WNLs. Iron studies pending.  -Lab in 3 months -f/u in 6 months  2. Fatigue and drowsiness  -Her TSH and sleep study wall normal. -If her oral level was low, we'll replaced with IV iron, which may also contribute to her fatigue -She will continue follow-up with her primary care physician  3. DM2, CHF and nonischemic CHF  -Continue management per PCP, her endocrinologist and cardiologist     4. Transaminitis -She still has elevated liver enzymes. 12/2015 ultrasound was negative except for hepatic steatosis -Upper Endoscopy by Dr. Myrtie Neither was normal overall  -Continue to follow up with GI   PLAN:  -f/u in 6 months -labs in 3 months.   All questions were answered. The patient knows to call the clinic with any problems, questions or concerns. We can certainly see the patient  much sooner if necessary.  I spent 15 minutes counseling the patient face to face. The total time spent in the appointment was 20 minutes.  Elenor Legato Dweik am acting as scribe for Dr. Malachy Mood.  I have reviewed the above documentation for accuracy and completeness, and I agree with the above.     Malachy Mood  03/28/2018

## 2018-03-28 NOTE — Telephone Encounter (Signed)
Scheduled appt per 10/14 los - gave patient AVS and calender per los.   

## 2018-03-29 ENCOUNTER — Telehealth (HOSPITAL_COMMUNITY): Payer: Self-pay

## 2018-03-29 LAB — IRON AND TIBC
Iron: 58 ug/dL (ref 41–142)
SATURATION RATIOS: 20 % — AB (ref 21–57)
TIBC: 291 ug/dL (ref 236–444)
UIBC: 233 ug/dL

## 2018-03-29 LAB — FERRITIN: Ferritin: 55 ng/mL (ref 11–307)

## 2018-03-29 NOTE — Telephone Encounter (Signed)
Pt called about lab results. Pt verbalizes understanding.

## 2018-03-31 ENCOUNTER — Telehealth (HOSPITAL_COMMUNITY): Payer: Self-pay | Admitting: Surgery

## 2018-03-31 DIAGNOSIS — R002 Palpitations: Secondary | ICD-10-CM

## 2018-03-31 NOTE — Telephone Encounter (Signed)
Patient called to let me know that her Zio monitor patch had come off in the shower.  I have scheduled her to come back for re-application on Monday October 21st. At 10AM.

## 2018-04-01 DIAGNOSIS — F319 Bipolar disorder, unspecified: Secondary | ICD-10-CM | POA: Diagnosis not present

## 2018-04-01 DIAGNOSIS — D649 Anemia, unspecified: Secondary | ICD-10-CM | POA: Diagnosis not present

## 2018-04-01 DIAGNOSIS — E11319 Type 2 diabetes mellitus with unspecified diabetic retinopathy without macular edema: Secondary | ICD-10-CM | POA: Diagnosis not present

## 2018-04-01 DIAGNOSIS — Z6833 Body mass index (BMI) 33.0-33.9, adult: Secondary | ICD-10-CM | POA: Diagnosis not present

## 2018-04-01 DIAGNOSIS — E1140 Type 2 diabetes mellitus with diabetic neuropathy, unspecified: Secondary | ICD-10-CM | POA: Diagnosis not present

## 2018-04-04 ENCOUNTER — Encounter (HOSPITAL_COMMUNITY): Payer: Medicare HMO

## 2018-04-04 ENCOUNTER — Telehealth: Payer: Self-pay

## 2018-04-04 ENCOUNTER — Ambulatory Visit (HOSPITAL_COMMUNITY)
Admission: RE | Admit: 2018-04-04 | Discharge: 2018-04-04 | Disposition: A | Discharge status: Home / Self Care | Payer: Medicare HMO | Source: Ambulatory Visit | Attending: Internal Medicine | Admitting: Internal Medicine

## 2018-04-04 NOTE — Telephone Encounter (Signed)
-----   Message from Malachy Mood, MD sent at 04/02/2018  7:39 PM EDT ----- Please let pt know her iron level is WNL, no concerns. Thanks   Malachy Mood  04/02/2018

## 2018-04-04 NOTE — Telephone Encounter (Signed)
Left voice message for patient per Dr. Mosetta Putt that lab results show iron level is WNL, no concerns.

## 2018-04-04 NOTE — Progress Notes (Signed)
Your provider has recommended that  you wear a Zio Patch for 7 days.  This monitor will record your heart rhythm for our review.  IF you have any symptoms while wearing the monitor please press the button.  If you have any issues with the patch or you notice a red or orange light on it please call the company at [phone].  Once you remove the patch please mail it back to the company as soon as possible so we can get the results.  

## 2018-04-07 DIAGNOSIS — M961 Postlaminectomy syndrome, not elsewhere classified: Secondary | ICD-10-CM | POA: Diagnosis not present

## 2018-04-07 DIAGNOSIS — M5416 Radiculopathy, lumbar region: Secondary | ICD-10-CM | POA: Diagnosis not present

## 2018-04-07 DIAGNOSIS — I10 Essential (primary) hypertension: Secondary | ICD-10-CM | POA: Diagnosis not present

## 2018-04-07 DIAGNOSIS — Z6833 Body mass index (BMI) 33.0-33.9, adult: Secondary | ICD-10-CM | POA: Diagnosis not present

## 2018-04-11 ENCOUNTER — Other Ambulatory Visit (HOSPITAL_COMMUNITY): Payer: Self-pay | Admitting: Cardiology

## 2018-04-20 DIAGNOSIS — I50812 Chronic right heart failure: Secondary | ICD-10-CM | POA: Diagnosis not present

## 2018-04-21 ENCOUNTER — Ambulatory Visit (HOSPITAL_COMMUNITY)
Admission: RE | Admit: 2018-04-21 | Discharge: 2018-04-21 | Disposition: A | Discharge status: Home / Self Care | Payer: Medicare HMO | Source: Ambulatory Visit | Attending: Cardiology | Admitting: Cardiology

## 2018-04-21 DIAGNOSIS — R002 Palpitations: Secondary | ICD-10-CM

## 2018-04-21 NOTE — Addendum Note (Signed)
Addended by: Noralee Space on: 04/21/2018 04:55 PM   Modules accepted: Orders

## 2018-04-22 DIAGNOSIS — E113591 Type 2 diabetes mellitus with proliferative diabetic retinopathy without macular edema, right eye: Secondary | ICD-10-CM | POA: Diagnosis not present

## 2018-05-02 ENCOUNTER — Encounter (HOSPITAL_BASED_OUTPATIENT_CLINIC_OR_DEPARTMENT_OTHER): Payer: Medicare HMO

## 2018-05-24 ENCOUNTER — Other Ambulatory Visit (HOSPITAL_COMMUNITY): Payer: Self-pay | Admitting: Internal Medicine

## 2018-05-25 DIAGNOSIS — N841 Polyp of cervix uteri: Secondary | ICD-10-CM | POA: Diagnosis not present

## 2018-05-25 DIAGNOSIS — Z1231 Encounter for screening mammogram for malignant neoplasm of breast: Secondary | ICD-10-CM | POA: Diagnosis not present

## 2018-05-25 DIAGNOSIS — Z6833 Body mass index (BMI) 33.0-33.9, adult: Secondary | ICD-10-CM | POA: Diagnosis not present

## 2018-05-25 DIAGNOSIS — Z01419 Encounter for gynecological examination (general) (routine) without abnormal findings: Secondary | ICD-10-CM | POA: Diagnosis not present

## 2018-05-31 ENCOUNTER — Other Ambulatory Visit (HOSPITAL_COMMUNITY): Payer: Self-pay

## 2018-05-31 DIAGNOSIS — I5022 Chronic systolic (congestive) heart failure: Secondary | ICD-10-CM

## 2018-05-31 MED ORDER — TORSEMIDE 20 MG PO TABS: 80.0000 mg | ORAL_TABLET | Freq: Every day | ORAL | 1 refills | Status: DC

## 2018-06-02 ENCOUNTER — Encounter: Payer: Self-pay | Admitting: Obstetrics and Gynecology

## 2018-06-02 ENCOUNTER — Other Ambulatory Visit (HOSPITAL_COMMUNITY)
Admission: RE | Admit: 2018-06-02 | Discharge: 2018-06-02 | Disposition: A | Discharge status: Home / Self Care | Payer: Medicare HMO | Source: Ambulatory Visit | Attending: Obstetrics and Gynecology | Admitting: Obstetrics and Gynecology

## 2018-06-02 ENCOUNTER — Ambulatory Visit (INDEPENDENT_AMBULATORY_CARE_PROVIDER_SITE_OTHER): Payer: Medicare HMO | Admitting: Obstetrics and Gynecology

## 2018-06-02 VITALS — BP 128/84 | Ht 65.0 in | Wt 200.0 lb

## 2018-06-02 DIAGNOSIS — N841 Polyp of cervix uteri: Secondary | ICD-10-CM

## 2018-06-02 DIAGNOSIS — D500 Iron deficiency anemia secondary to blood loss (chronic): Secondary | ICD-10-CM

## 2018-06-02 DIAGNOSIS — N921 Excessive and frequent menstruation with irregular cycle: Secondary | ICD-10-CM | POA: Insufficient documentation

## 2018-06-02 NOTE — Progress Notes (Signed)
Obstetrics & Gynecology Office Visit   Chief Complaint  Patient presents with  . Referral    Heide GuileLynn Lam, NP, [facility]  . Menstrual Problem    patient has had ablation, had no period for 5 years and now it has come back    History of Present Illness: 47 y.o. 701P0101 female who is seen in referral from Dr. Jen MowElizabeth Mumaw, [facility] for menorrhagia with regular cycles.  The patient had an ablation about five years ago. About three months ago she began having some vaginal bleeding again.  Her ablation was due to menorrhagia and anemia.  She will have bleeding for two days, then it disappears, then it starts again. She also, notes a strong odor with her menses.  She denies passage of blood clots.  She believes her bleeding is getting worse overall.  She states that she changes pads 4-5 times per day, but she always changes her pads when she goes to the bathroom, which is frequently.  She states that she does go to the cancer center due to low iron levels.  She states that the last time she went her levels were normal.  She states that her last pap smear was last week and reports it was normal (no records available).  She also states that she was told a polyp was present on her cervix. She has not had a recent pelvic ultrasound.   Past Medical History:  Diagnosis Date  . Anemia    hx of iron transfusions - last one 10/2013   . Anxiety   . Arthritis    neck and shoulders   . Asthma   . Back pain, chronic   . Bipolar disorder (HCC)   . Carpal tunnel syndrome on both sides   . Cataract   . Depression   . Diabetes mellitus   . Fibromyalgia   . GERD (gastroesophageal reflux disease)   . History of gastric bypass Jan 2013  . Hyperlipidemia   . Hypertension   . Insomnia   . Migraines   . Nervous breakdown sept 2013  . Neuropathy   . Nonischemic cardiomyopathy (HCC)   . OSA (obstructive sleep apnea) 02/21/2015  . Peripheral vascular disease  (HCC)   . Shortness of breath    with exertion   . Sleep apnea    borderline- no cpap yet  . Systolic CHF, acute on chronic (HCC)    CARDIOLOGIST-- DR Aris LotKATARINA NELSON  (Corinda GublerLEBAUER)  . Wound of left breast     Past Surgical History:  Procedure Laterality Date  . BACK SURGERY  November 1992  . BREAST SURGERY Left    I&D for abcsess   . CARDIAC CATHETERIZATION  11-14-2013  DR Marca AnconaALTON MCLEAN   NORMAL LV FILLING PRESSURE W/ MILD ELEVATED RV FILLING, POSSIBLY SUFFESTING RV FAILURE OUT OF PROPORTION TO LV FAILURE. EF ABOUT 35% WITH DIFFUSE HYPOKINESIS. NO ANGIOGRAPHIC CAD, NONISCHEMIC CARDIOMYOPATHY  . CESAREAN SECTION  December 1998  . CHOLECYSTECTOMY  1998  . DILITATION & CURRETTAGE/HYSTROSCOPY WITH NOVASURE ABLATION N/A 02/26/2014   Procedure: DILATATION & CURETTAGE/HYSTEROSCOPY WITH NOVASURE ABLATION;  Surgeon: Meriel Picaichard M Holland, MD;  Location: [facility];  Service: Gynecology;  Laterality: N/A;  . ESOPHAGOGASTRODUODENOSCOPY  07/27/2011   Procedure: ESOPHAGOGASTRODUODENOSCOPY (EGD);  Surgeon: Freddy JakschWilliam M Outlaw, MD;  [facility];  Service: Endoscopy;  Laterality: N/A;  . GASTRIC ROUX-EN-Y  06/30/2011   Procedure: LAPAROSCOPIC ROUX-EN-Y GASTRIC;  Surgeon: Mariella SaaBenjamin T Hoxworth, MD;  Location: [facility];  Service: General;  Laterality: N/A;     . INCISION AND DRAINAGE OF WOUND Left 12/14/2013   Procedure: IRRIGATION AND DEBRIDEMENT OF LEFT BREAST WOUND WITH PLACEMENT OF A-CELL, WITH CLOSURE ;  Surgeon: Wayland Denis, DO;  Location: [facility];  Service: Plastics;  Laterality: Left;  . IRRIGATION AND DEBRIDEMENT ABSCESS Left 11/26/2013   Procedure: IRRIGATION AND DEBRIDEMENT ABSCESS;  Surgeon: Velora Heckler, MD;  Location: [facility];  Service: General;  Laterality: Left;  . LEFT AND RIGHT HEART CATHETERIZATION WITH CORONARY ANGIOGRAM N/A 11/14/2013   Procedure: LEFT AND RIGHT HEART CATHETERIZATION WITH CORONARY ANGIOGRAM;  Surgeon: Laurey Morale, MD;  Location: [facility] CATH LAB;  Service: Cardiovascular;   Laterality: N/A;  . SPINAL CORD STIMULATOR INSERTION N/A 03/09/2014   Procedure: LUMBAR SPINAL CORD STIMULATOR INSERTION;  Surgeon: Gwynne Edinger, MD;  Location: [facility];  Service: Neurosurgery;  Laterality: N/A;  . TUBAL LIGATION  January 1999    Gynecologic History: No LMP recorded. Patient has had an ablation.  Obstetric History: G1P0101  Family History  Problem Relation Age of Onset  . Hypertension Mother   . Diabetes Maternal Aunt   . Diabetes Paternal Aunt   . Diabetes Paternal Grandfather   . Hypertension Paternal Grandfather   . Diabetes Paternal Grandmother   . Heart disease Brother   . Heart attack Neg Hx   . Stroke Neg Hx   . Colon cancer Neg Hx   . Colon polyps Neg Hx   . Esophageal cancer Neg Hx   . Rectal cancer Neg Hx   . Stomach cancer Neg Hx   . Breast cancer Neg Hx     Social History   Socioeconomic History  . Marital status: Single    Spouse name: Not on file  . Number of children: Not on file  . Years of education: Not on file  . Highest education level: Not on file  Occupational History  . Not on file  Social Needs  . Financial resource strain: Not on file  . Food insecurity:    Worry: Not on file    Inability: Not on file  . Transportation needs:    Medical: Not on file    Non-medical: Not on file  Tobacco Use  . Smoking status: Never Smoker  . Smokeless tobacco: Never Used  Substance and Sexual Activity  . Alcohol use: No  . Drug use: No  . Sexual activity: Never  Lifestyle  . Physical activity:    Days per week: Not on file    Minutes per session: Not on file  . Stress: Not on file  Relationships  . Social connections:    Talks on phone: Not on file    Gets together: Not on file    Attends religious service: Not on file    Active member of club or organization: Not on file    Attends meetings of clubs or organizations: Not on file    Relationship status: Not on file  . Intimate partner violence:    Fear of current or ex  partner: Not on file    Emotionally abused: Not on file    Physically abused: Not on file    Forced sexual activity: Not on file  Other Topics Concern  . Not on file  Social History Narrative   ** Merged History Encounter **        Allergies  Allergen Reactions  . Imitrex [Sumatriptan] Other (See Comments)    Acid reflux---pill only--  shots are okay     Prior to Admission medications   Medication Sig Start Date End Date Taking? Authorizing Provider  amLODipine (NORVASC) 10 MG tablet Take 1 tablet (10 mg total) by mouth daily. 08/19/17 03/28/18  Graciella Freer, PA-C  Biotin 5000 MCG TABS Take 5,000 mcg by mouth daily.    [provider]  buPROPion (WELLBUTRIN XL) 300 MG 24 hr tablet Take 300 mg by mouth daily before breakfast.  02/23/13   [provider]  busPIRone (BUSPAR) 15 MG tablet Take 15 mg by mouth 3 (three) times daily. 03/13/16   [provider]  carvedilol (COREG) 25 MG tablet TAKE 1 TABLET BY MOUTH TWICE A DAY WITH MEALS 02/08/18   Laurey Morale, MD  Cholecalciferol (VITAMIN D3) 5000 units TABS Take 5,000 Units by mouth daily.     [provider]  CORLANOR 5 MG TABS tablet TAKE 1 TABLET BY MOUTH 2 (TWO) TIMES DAILY WITH A MEAL. 03/23/18   Laurey Morale, MD  cyclobenzaprine (FLEXERIL) 10 MG tablet Take 10 mg by mouth 3 (three) times daily as needed for muscle spasms.  11/23/13   [provider]  diclofenac sodium (VOLTAREN) 1 % GEL Apply 2 g topically 2 (two) times daily.    [provider]  DULoxetine (CYMBALTA) 60 MG capsule Take 60 mg by mouth daily.    [provider]  ENTRESTO 97-103 MG TAKE 1 TABLET BY MOUTH 2 (TWO) TIMES DAILY. 04/11/18   Laurey Morale, MD  fluticasone (FLONASE) 50 MCG/ACT nasal spray Place 1 spray into both nostrils daily.  03/07/13   [provider]  ibuprofen (ADVIL,MOTRIN) 800 MG tablet Take 800 mg by mouth every 8 (eight) hours as needed (pain).     [provider]  Insulin Glargine (LANTUS SOLOSTAR) 100 UNIT/ML Solostar Pen Inject 30 Units into the skin daily.     [provider]  lidocaine (LIDODERM) 5 % Place 1 patch onto the skin daily as needed (for back pain).  03/21/13   [provider]  lubiprostone (AMITIZA) 24 MCG capsule Take 24 mcg by mouth 2 (two) times daily as needed for constipation.    [provider]  magnesium oxide (MAG-OX) 400 MG tablet TAKE 1 TABLET BY MOUTH EVERY DAY 05/25/18   Bensimhon, Bevelyn Buckles, MD  oxyCODONE-acetaminophen (PERCOCET) 10-325 MG per tablet Take 0.5-1 tablets by mouth every 4 (four) hours as needed for pain.    [provider]  polyethylene glycol (MIRALAX / GLYCOLAX) packet Take 17 g by mouth 3 (three) times a week.    [provider]  potassium chloride (K-DUR) 10 MEQ tablet TAKE 4 TABLETS (40 MEQ TOTAL) BY MOUTH 3 (THREE) TIMES DAILY. 02/21/18 05/22/18  Graciella Freer, PA-C  pregabalin (LYRICA) 150 MG capsule Take 150 mg by mouth 3 (three) times daily.    [provider]  ranitidine (ZANTAC) 150 MG tablet Take 150 mg by mouth daily. 02/11/13   [provider]  rosuvastatin (CRESTOR) 5 MG tablet Take 1 tablet (5 mg total) by mouth daily. 11/23/17 03/28/18  Laurey Morale, MD  spironolactone (ALDACTONE) 25 MG tablet TAKE 1 TABLET BY MOUTH DAILY. 07/19/17   Bensimhon, Bevelyn Buckles, MD  torsemide (DEMADEX) 20 MG tablet Take 4 tablets (80 mg total) by mouth daily. 05/31/18   Laurey Morale, MD  VENTOLIN HFA 108 (90 BASE) MCG/ACT inhaler INHALE 2 PUFFS BY MOUTH EVERY 4 TO 6 HOURS AS NEEDED FOR FOR SHORTNESS  OF BREATH OR WHEEZING 02/07/15   Lars Masson, MD  VICTOZA 18 MG/3ML SOPN Inject 1.8 mg into the skin daily. 03/14/16   [provider]    Review of Systems  Constitutional: Negative.   HENT: Negative.   Eyes: Negative.   Respiratory: Negative.   Cardiovascular: Negative.   Gastrointestinal: Negative.   Genitourinary:  Negative.        See HPI  Musculoskeletal: Negative.   Skin: Negative.   Neurological: Negative.   Psychiatric/Behavioral: Negative.      Physical Exam BP 128/84   Ht 5\' 5"  (1.651 m)   Wt 200 lb (90.7 kg)   BMI 33.28 kg/m  No LMP recorded. Patient has had an ablation. Physical Exam Constitutional:      General: She is not in acute distress.    Appearance: She is well-developed.  Genitourinary:     Pelvic exam was performed with patient supine.     Vulva, urethra, bladder, vagina and uterus normal.     No posterior fourchette tenderness, injury or lesion present.     Cervical lesion and polyp present.     No cervical discharge, friability, bleeding or cyanosis.        Genitourinary Comments: Bimanual exam limited by body habitus.  Eyes:     General: No scleral icterus.    Conjunctiva/sclera: Conjunctivae normal.  Neck:     Musculoskeletal: Normal range of motion and neck supple.  Cardiovascular:     Rate and Rhythm: Normal rate and regular rhythm.  Pulmonary:     Effort: Pulmonary effort is normal. No respiratory distress.     Breath sounds: Normal breath sounds. No wheezing or rales.  Abdominal:     General: Bowel sounds are normal. There is no distension.     Palpations: Abdomen is soft. There is no mass.     Tenderness: There is no abdominal tenderness. There is no guarding or rebound.  Musculoskeletal: Normal range of motion.  Neurological:     General: No focal deficit present.     Mental Status: She is alert and oriented to person, place, and time.     Cranial Nerves: No cranial nerve deficit.  Skin:    General: Skin is warm and dry.     Findings: No erythema.  Psychiatric:        Mood and Affect: Mood normal.        Behavior: Behavior normal.        Judgment: Judgment normal.    Procedure Note: Discussed need to remove endocervical polyp. She voiced understanding and agreement.   She was already in the supine lithotomy position with a speculum in place.  The polyp was visualized and grasped with a ring forceps. The forceps were rotated about 12 times and the polyp was removed.  A small portion appeared to remain. It was removed in a similar fashion.  No visible polyp remained. Her endocervix was treated with silver nitrate to destroy any remaining tissue and prevent bleeding.  The patient tolerated the procedure well.  The polyp will be sent for pathology.   Female chaperone present for pelvic and breast  portions of the physical exam  Assessment: 48 y.o. G13P0101 female here for  1. Menorrhagia with irregular cycle   2. Iron deficiency anemia due to chronic blood loss   3. Cervical polyp      Plan: Problem List Items Addressed This Visit      Other   Iron deficiency anemia due to chronic  blood loss   Relevant Orders   Surgical pathology   Menorrhagia with irregular cycle - Primary   Relevant Orders   Surgical pathology    Other Visit Diagnoses    Cervical polyp         Polyp removed.  Will send for pathology. It is possible her bleeding is coming solely from her polyp. Discussed that we could obtain a pelvic ultrasound to verify this as the only source of bleeding. Alternatively, we could wait to see the benefits she receives from the removal of the polyp.  If her bleeding continues, will obtain ultrasound at that time.  30 minutes spent in face to face discussion with > 50% spent in counseling,management, and coordination of care of her menorrhagia with irregular cycle and cervical polyp.   Thomasene Mohair, MD 06/03/2018 5:38 PM     CC: Ronal Fear, NP [address] [place], [place] [address]

## 2018-06-03 ENCOUNTER — Encounter: Payer: Self-pay | Admitting: Obstetrics and Gynecology

## 2018-06-27 ENCOUNTER — Inpatient Hospital Stay: Payer: Medicare HMO | Attending: Hematology

## 2018-07-01 ENCOUNTER — Ambulatory Visit (HOSPITAL_BASED_OUTPATIENT_CLINIC_OR_DEPARTMENT_OTHER): Payer: Medicare HMO | Attending: Student | Admitting: Cardiology

## 2018-07-01 VITALS — Ht 65.0 in | Wt 187.0 lb

## 2018-07-01 DIAGNOSIS — G4733 Obstructive sleep apnea (adult) (pediatric): Secondary | ICD-10-CM | POA: Diagnosis not present

## 2018-07-01 DIAGNOSIS — R0683 Snoring: Secondary | ICD-10-CM

## 2018-07-01 DIAGNOSIS — I509 Heart failure, unspecified: Secondary | ICD-10-CM

## 2018-07-01 DIAGNOSIS — R5383 Other fatigue: Secondary | ICD-10-CM | POA: Insufficient documentation

## 2018-07-01 DIAGNOSIS — I5022 Chronic systolic (congestive) heart failure: Secondary | ICD-10-CM | POA: Diagnosis not present

## 2018-07-05 NOTE — Procedures (Signed)
    Patient Name: [name], [name] Date: 07/01/2018 Gender: Female D.O.B: 1970-08-18 Age (years): 47 Referring Provider: Maxine Glenn PA-C Height (inches): 65 Interpreting Physician: Armanda Magic MD, ABSM Weight (lbs): 187 RPSGT: Cherylann Parr BMI: 31 MRN: [id number] Neck Size: 16.50  CLINICAL INFORMATION Sleep Study Type: NPSG  Indication for sleep study: Congestive Heart Failure, Fatigue, Snoring  Epworth Sleepiness Score: 24  SLEEP STUDY TECHNIQUE As per the AASM Manual for the Scoring of Sleep and Associated Events v2.3 (April 2016) with a hypopnea requiring 4% desaturations.  The channels recorded and monitored were frontal, central and occipital EEG, electrooculogram (EOG), submentalis EMG (chin), nasal and oral airflow, thoracic and abdominal wall motion, anterior tibialis EMG, snore microphone, electrocardiogram, and pulse oximetry.  MEDICATIONS Medications self-administered by patient taken the night of the study : N/A  SLEEP ARCHITECTURE The study was initiated at 10:18:10 PM and ended at 5:02:31 AM.  Sleep onset time was 15.0 minutes and the sleep efficiency was 87.0%. The total sleep time was 351.8 minutes.  Stage REM latency was 243.0 minutes.  The patient spent 2.6% of the night in stage N1 sleep, 86.5% in stage N2 sleep, 0.0% in stage N3 and 10.9% in REM.  Alpha intrusion was absent.  Supine sleep was 61.34%.  RESPIRATORY PARAMETERS The overall apnea/hypopnea index (AHI) was 6.1 per hour. There were 7 total apneas, including 5 obstructive, 2 central and 0 mixed apneas. There were 29 hypopneas and 0 RERAs.  The AHI during Stage REM sleep was 1.6 per hour.  AHI while supine was 7.5 per hour.  The mean oxygen saturation was 93.5%. The minimum SpO2 during sleep was 82.0%.  moderate snoring was noted during this study.  CARDIAC DATA The 2 lead EKG demonstrated sinus rhythm. The mean heart rate was 72.3 beats per minute. Other EKG findings  include: None.  LEG MOVEMENT DATA The total PLMS were 0 with a resulting PLMS index of 0.0. Associated arousal with leg movement index was 0.0 .  IMPRESSIONS - Mild obstructive sleep apnea occurred during this study (AHI = 6.1/h). - No significant central sleep apnea occurred during this study (CAI = 0.3/h). - Mild oxygen desaturation was noted during this study (Min O2 = 82.0%). - The patient snored with moderate snoring volume. - No cardiac abnormalities were noted during this study. - Clinically significant periodic limb movements did not occur during sleep. No significant associated arousals.  DIAGNOSIS - Obstructive Sleep Apnea (327.23 [G47.33 ICD-10]  RECOMMENDATIONS - Recommend CPAP titration for sleep disordered breathing. - Positional therapy avoiding supine position during sleep. - Avoid alcohol, sedatives and other CNS depressants that may worsen sleep apnea and disrupt normal sleep architecture. - Sleep hygiene should be reviewed to assess factors that may improve sleep quality. - Weight management and regular exercise should be initiated or continued if appropriate.  [Electronically signed] 07/05/2018 05:58 PM  Armanda Magic MD, ABSM Diplomate, American Board of Sleep Medicine

## 2018-07-06 DIAGNOSIS — M961 Postlaminectomy syndrome, not elsewhere classified: Secondary | ICD-10-CM | POA: Diagnosis not present

## 2018-07-06 DIAGNOSIS — I10 Essential (primary) hypertension: Secondary | ICD-10-CM | POA: Diagnosis not present

## 2018-07-06 DIAGNOSIS — M5416 Radiculopathy, lumbar region: Secondary | ICD-10-CM | POA: Diagnosis not present

## 2018-07-06 DIAGNOSIS — Z6832 Body mass index (BMI) 32.0-32.9, adult: Secondary | ICD-10-CM | POA: Diagnosis not present

## 2018-07-08 ENCOUNTER — Telehealth: Payer: Self-pay | Admitting: *Deleted

## 2018-07-08 DIAGNOSIS — G4733 Obstructive sleep apnea (adult) (pediatric): Secondary | ICD-10-CM

## 2018-07-08 NOTE — Telephone Encounter (Signed)
-----   Message from Reesa Chew, CMA sent at 07/08/2018 12:50 PM EST ----- Regarding: precert  recommend CPAP titration.

## 2018-07-08 NOTE — Telephone Encounter (Signed)
Informed patient of sleep study results and patient understanding was verbalized. Patient understands her sleep study showed   they have sleep apnea and recommend CPAP titration. Please set up titration in the sleep lab.  Pt is aware and agreeable to results.     

## 2018-07-08 NOTE — Telephone Encounter (Signed)
Staff message sent to [facility] Auth received. Ok to schedule CPAP titration. Auth # [id number]. Valid Dates 07/25/18/ to 08/24/18.

## 2018-07-08 NOTE — Telephone Encounter (Signed)
-----   Message from Quintella Reichert, MD sent at 07/05/2018  6:01 PM EST ----- Please let patient know that they have sleep apnea and recommend CPAP titration. Please set up titration in the sleep lab.

## 2018-07-11 ENCOUNTER — Encounter (HOSPITAL_COMMUNITY): Payer: Medicare HMO | Admitting: Cardiology

## 2018-07-13 ENCOUNTER — Encounter: Payer: Self-pay | Admitting: *Deleted

## 2018-07-13 NOTE — Telephone Encounter (Signed)
Patient is scheduled for CPAP Titration on 08/18/18. Patient understands her titration study will be done at [facility] sleep lab. Patient understands she will receive a letter in a week or so detailing appointment, date, time, and location. Patient understands to call if she does not receive the letter  in a timely manner. Patient agrees with treatment and thanked me for call.

## 2018-07-13 NOTE — Telephone Encounter (Signed)
This encounter was created in error - please disregard.

## 2018-07-13 NOTE — Telephone Encounter (Signed)
-----   Message from Gaynelle Cage, CMA sent at 07/08/2018  2:31 PM EST ----- Regarding: RE: precert [employer] Auth received. Ok to schedule CPAP titration study. Auth # [id number]. Valid Dates 07/25/18 to 08/24/18. ----- Message ----- From: Reesa Chew, CMA Sent: 07/08/2018  12:50 PM EST To: [facility] Sleep Studies Subject: precert                                         recommend CPAP titration.

## 2018-07-13 NOTE — Telephone Encounter (Signed)
  Gaynelle Cage, CMA  Reesa Chew, CMA        [employer] Auth received. Ok to schedule CPAP titration study. Auth # [id number]. Valid Dates 07/25/18 to 08/24/18.     ----- Message -----  From: Reesa Chew, CMA  Sent: 07/08/2018 12:50 PM EST  To: Cv Div Sleep Studies  Subject: precert                      recommend CPAP titration.

## 2018-07-15 ENCOUNTER — Other Ambulatory Visit (HOSPITAL_COMMUNITY): Payer: Self-pay | Admitting: Internal Medicine

## 2018-07-15 ENCOUNTER — Encounter: Payer: Self-pay | Admitting: *Deleted

## 2018-07-15 DIAGNOSIS — H4312 Vitreous hemorrhage, left eye: Secondary | ICD-10-CM | POA: Diagnosis not present

## 2018-07-15 DIAGNOSIS — E113593 Type 2 diabetes mellitus with proliferative diabetic retinopathy without macular edema, bilateral: Secondary | ICD-10-CM | POA: Diagnosis not present

## 2018-07-15 DIAGNOSIS — H3582 Retinal ischemia: Secondary | ICD-10-CM | POA: Diagnosis not present

## 2018-07-17 ENCOUNTER — Other Ambulatory Visit (HOSPITAL_COMMUNITY): Payer: Self-pay | Admitting: Cardiology

## 2018-07-17 DIAGNOSIS — I5022 Chronic systolic (congestive) heart failure: Secondary | ICD-10-CM

## 2018-07-18 DIAGNOSIS — Z6832 Body mass index (BMI) 32.0-32.9, adult: Secondary | ICD-10-CM | POA: Diagnosis not present

## 2018-07-18 DIAGNOSIS — I428 Other cardiomyopathies: Secondary | ICD-10-CM | POA: Diagnosis not present

## 2018-07-18 DIAGNOSIS — E1140 Type 2 diabetes mellitus with diabetic neuropathy, unspecified: Secondary | ICD-10-CM | POA: Diagnosis not present

## 2018-07-18 DIAGNOSIS — F319 Bipolar disorder, unspecified: Secondary | ICD-10-CM | POA: Diagnosis not present

## 2018-07-18 DIAGNOSIS — E1143 Type 2 diabetes mellitus with diabetic autonomic (poly)neuropathy: Secondary | ICD-10-CM | POA: Diagnosis not present

## 2018-07-18 DIAGNOSIS — E11319 Type 2 diabetes mellitus with unspecified diabetic retinopathy without macular edema: Secondary | ICD-10-CM | POA: Diagnosis not present

## 2018-08-01 ENCOUNTER — Emergency Department (HOSPITAL_COMMUNITY): Payer: Medicare HMO

## 2018-08-01 ENCOUNTER — Observation Stay (HOSPITAL_COMMUNITY): Payer: Medicare HMO

## 2018-08-01 ENCOUNTER — Encounter (HOSPITAL_COMMUNITY): Payer: Self-pay

## 2018-08-01 ENCOUNTER — Inpatient Hospital Stay (HOSPITAL_COMMUNITY)
Admission: EM | Admit: 2018-08-01 | Discharge: 2018-08-05 | DRG: 312 | Disposition: A | Discharge status: Home Health | Payer: Medicare HMO | Attending: Internal Medicine | Admitting: Internal Medicine

## 2018-08-01 ENCOUNTER — Other Ambulatory Visit: Payer: Self-pay

## 2018-08-01 DIAGNOSIS — G8929 Other chronic pain: Secondary | ICD-10-CM | POA: Diagnosis not present

## 2018-08-01 DIAGNOSIS — J45909 Unspecified asthma, uncomplicated: Secondary | ICD-10-CM | POA: Diagnosis present

## 2018-08-01 DIAGNOSIS — R7989 Other specified abnormal findings of blood chemistry: Secondary | ICD-10-CM | POA: Diagnosis present

## 2018-08-01 DIAGNOSIS — Z9884 Bariatric surgery status: Secondary | ICD-10-CM | POA: Diagnosis not present

## 2018-08-01 DIAGNOSIS — Z833 Family history of diabetes mellitus: Secondary | ICD-10-CM

## 2018-08-01 DIAGNOSIS — M797 Fibromyalgia: Secondary | ICD-10-CM | POA: Diagnosis present

## 2018-08-01 DIAGNOSIS — I5042 Chronic combined systolic (congestive) and diastolic (congestive) heart failure: Secondary | ICD-10-CM | POA: Diagnosis present

## 2018-08-01 DIAGNOSIS — Z79899 Other long term (current) drug therapy: Secondary | ICD-10-CM

## 2018-08-01 DIAGNOSIS — Y92411 Interstate highway as the place of occurrence of the external cause: Secondary | ICD-10-CM

## 2018-08-01 DIAGNOSIS — E1165 Type 2 diabetes mellitus with hyperglycemia: Secondary | ICD-10-CM | POA: Diagnosis not present

## 2018-08-01 DIAGNOSIS — I5022 Chronic systolic (congestive) heart failure: Secondary | ICD-10-CM | POA: Diagnosis not present

## 2018-08-01 DIAGNOSIS — Z7951 Long term (current) use of inhaled steroids: Secondary | ICD-10-CM | POA: Diagnosis not present

## 2018-08-01 DIAGNOSIS — T501X5A Adverse effect of loop [high-ceiling] diuretics, initial encounter: Secondary | ICD-10-CM | POA: Diagnosis present

## 2018-08-01 DIAGNOSIS — R945 Abnormal results of liver function studies: Secondary | ICD-10-CM | POA: Diagnosis not present

## 2018-08-01 DIAGNOSIS — R531 Weakness: Secondary | ICD-10-CM | POA: Diagnosis not present

## 2018-08-01 DIAGNOSIS — R748 Abnormal levels of other serum enzymes: Secondary | ICD-10-CM

## 2018-08-01 DIAGNOSIS — I110 Hypertensive heart disease with heart failure: Secondary | ICD-10-CM | POA: Diagnosis present

## 2018-08-01 DIAGNOSIS — T447X5A Adverse effect of beta-adrenoreceptor antagonists, initial encounter: Secondary | ICD-10-CM | POA: Diagnosis present

## 2018-08-01 DIAGNOSIS — Z7952 Long term (current) use of systemic steroids: Secondary | ICD-10-CM | POA: Diagnosis not present

## 2018-08-01 DIAGNOSIS — E785 Hyperlipidemia, unspecified: Secondary | ICD-10-CM | POA: Diagnosis present

## 2018-08-01 DIAGNOSIS — Z791 Long term (current) use of non-steroidal anti-inflammatories (NSAID): Secondary | ICD-10-CM

## 2018-08-01 DIAGNOSIS — Z9049 Acquired absence of other specified parts of digestive tract: Secondary | ICD-10-CM | POA: Diagnosis not present

## 2018-08-01 DIAGNOSIS — F319 Bipolar disorder, unspecified: Secondary | ICD-10-CM | POA: Diagnosis present

## 2018-08-01 DIAGNOSIS — E875 Hyperkalemia: Secondary | ICD-10-CM

## 2018-08-01 DIAGNOSIS — N179 Acute kidney failure, unspecified: Secondary | ICD-10-CM | POA: Diagnosis not present

## 2018-08-01 DIAGNOSIS — R55 Syncope and collapse: Secondary | ICD-10-CM | POA: Diagnosis not present

## 2018-08-01 DIAGNOSIS — E1151 Type 2 diabetes mellitus with diabetic peripheral angiopathy without gangrene: Secondary | ICD-10-CM | POA: Diagnosis present

## 2018-08-01 DIAGNOSIS — E119 Type 2 diabetes mellitus without complications: Secondary | ICD-10-CM

## 2018-08-01 DIAGNOSIS — R4182 Altered mental status, unspecified: Secondary | ICD-10-CM | POA: Diagnosis not present

## 2018-08-01 DIAGNOSIS — K219 Gastro-esophageal reflux disease without esophagitis: Secondary | ICD-10-CM | POA: Diagnosis present

## 2018-08-01 DIAGNOSIS — Z794 Long term (current) use of insulin: Secondary | ICD-10-CM

## 2018-08-01 DIAGNOSIS — E1140 Type 2 diabetes mellitus with diabetic neuropathy, unspecified: Secondary | ICD-10-CM | POA: Diagnosis present

## 2018-08-01 DIAGNOSIS — I959 Hypotension, unspecified: Secondary | ICD-10-CM | POA: Diagnosis not present

## 2018-08-01 DIAGNOSIS — T461X5A Adverse effect of calcium-channel blockers, initial encounter: Secondary | ICD-10-CM | POA: Diagnosis present

## 2018-08-01 DIAGNOSIS — I428 Other cardiomyopathies: Secondary | ICD-10-CM | POA: Diagnosis present

## 2018-08-01 DIAGNOSIS — G4733 Obstructive sleep apnea (adult) (pediatric): Secondary | ICD-10-CM | POA: Diagnosis present

## 2018-08-01 DIAGNOSIS — E1142 Type 2 diabetes mellitus with diabetic polyneuropathy: Secondary | ICD-10-CM | POA: Diagnosis not present

## 2018-08-01 DIAGNOSIS — T500X5A Adverse effect of mineralocorticoids and their antagonists, initial encounter: Secondary | ICD-10-CM | POA: Diagnosis present

## 2018-08-01 DIAGNOSIS — R0602 Shortness of breath: Secondary | ICD-10-CM | POA: Diagnosis not present

## 2018-08-01 DIAGNOSIS — I471 Supraventricular tachycardia: Secondary | ICD-10-CM | POA: Diagnosis present

## 2018-08-01 LAB — COMPREHENSIVE METABOLIC PANEL
ALT: 63 U/L — ABNORMAL HIGH (ref 0–44)
AST: 62 U/L — AB (ref 15–41)
Albumin: 4 g/dL (ref 3.5–5.0)
Alkaline Phosphatase: 139 U/L — ABNORMAL HIGH (ref 38–126)
Anion gap: 10 (ref 5–15)
BUN: 42 mg/dL — ABNORMAL HIGH (ref 6–20)
CO2: 17 mmol/L — ABNORMAL LOW (ref 22–32)
Calcium: 9 mg/dL (ref 8.9–10.3)
Chloride: 108 mmol/L (ref 98–111)
Creatinine, Ser: 1.44 mg/dL — ABNORMAL HIGH (ref 0.44–1.00)
GFR calc Af Amer: 50 mL/min — ABNORMAL LOW (ref >60)
GFR, EST NON AFRICAN AMERICAN: 43 mL/min — AB (ref >60)
Glucose, Bld: 252 mg/dL — ABNORMAL HIGH (ref 70–99)
Potassium: 5.9 mmol/L — ABNORMAL HIGH (ref 3.5–5.1)
Sodium: 135 mmol/L (ref 135–145)
Total Bilirubin: 0.4 mg/dL (ref 0.3–1.2)
Total Protein: 7.5 g/dL (ref 6.5–8.1)

## 2018-08-01 LAB — CBC
HCT: 39.4 % (ref 36.0–46.0)
Hemoglobin: 12.2 g/dL (ref 12.0–15.0)
MCH: 29.4 pg (ref 26.0–34.0)
MCHC: 31 g/dL (ref 30.0–36.0)
MCV: 94.9 fL (ref 80.0–100.0)
NRBC: 0 % (ref 0.0–0.2)
Platelets: 218 10*3/uL (ref 150–400)
RBC: 4.15 MIL/uL (ref 3.87–5.11)
RDW: 12.5 % (ref 11.5–15.5)
WBC: 11.6 10*3/uL — AB (ref 4.0–10.5)

## 2018-08-01 LAB — BLOOD GAS, VENOUS
Acid-base deficit: 3.8 mmol/L — ABNORMAL HIGH (ref 0.0–2.0)
Bicarbonate: 22.4 mmol/L (ref 20.0–28.0)
O2 Saturation: 58.8 %
Patient temperature: 98.6
pCO2, Ven: 47.8 mmHg (ref 44.0–60.0)
pH, Ven: 7.294 (ref 7.250–7.430)
pO2, Ven: 33.8 mmHg (ref 32.0–45.0)

## 2018-08-01 LAB — URINALYSIS, ROUTINE W REFLEX MICROSCOPIC
Bilirubin Urine: NEGATIVE
GLUCOSE, UA: 50 mg/dL — AB
KETONES UR: NEGATIVE mg/dL
NITRITE: NEGATIVE
PROTEIN: NEGATIVE mg/dL
Specific Gravity, Urine: 1.01 (ref 1.005–1.030)
pH: 5 (ref 5.0–8.0)

## 2018-08-01 LAB — RAPID URINE DRUG SCREEN, HOSP PERFORMED
Amphetamines: NOT DETECTED
BENZODIAZEPINES: NOT DETECTED
Barbiturates: NOT DETECTED
Cocaine: NOT DETECTED
Opiates: NOT DETECTED
Tetrahydrocannabinol: NOT DETECTED

## 2018-08-01 LAB — BASIC METABOLIC PANEL
Anion gap: 6 (ref 5–15)
BUN: 39 mg/dL — ABNORMAL HIGH (ref 6–20)
CHLORIDE: 109 mmol/L (ref 98–111)
CO2: 20 mmol/L — ABNORMAL LOW (ref 22–32)
Calcium: 8.9 mg/dL (ref 8.9–10.3)
Creatinine, Ser: 1.27 mg/dL — ABNORMAL HIGH (ref 0.44–1.00)
GFR calc Af Amer: 58 mL/min — ABNORMAL LOW (ref >60)
GFR calc non Af Amer: 50 mL/min — ABNORMAL LOW (ref >60)
Glucose, Bld: 311 mg/dL — ABNORMAL HIGH (ref 70–99)
Potassium: 5 mmol/L (ref 3.5–5.1)
SODIUM: 135 mmol/L (ref 135–145)

## 2018-08-01 LAB — CBG MONITORING, ED: Glucose-Capillary: 239 mg/dL — ABNORMAL HIGH (ref 70–99)

## 2018-08-01 LAB — I-STAT BETA HCG BLOOD, ED (MC, WL, AP ONLY): I-stat hCG, quantitative: <5 m[IU]/mL (ref <5)

## 2018-08-01 LAB — GLUCOSE, CAPILLARY
GLUCOSE-CAPILLARY: 92 mg/dL (ref 70–99)
Glucose-Capillary: 193 mg/dL — ABNORMAL HIGH (ref 70–99)

## 2018-08-01 LAB — ETHANOL: Alcohol, Ethyl (B): <10 mg/dL (ref <10)

## 2018-08-01 LAB — LACTIC ACID, PLASMA
Lactic Acid, Venous: 1.6 mmol/L (ref 0.5–1.9)
Lactic Acid, Venous: 1.9 mmol/L (ref 0.5–1.9)

## 2018-08-01 LAB — AMMONIA: AMMONIA: 40 umol/L — AB (ref 9–35)

## 2018-08-01 MED ORDER — SODIUM CHLORIDE 0.9 % IV BOLUS
1000.0000 mL | Freq: Once | INTRAVENOUS | Status: AC
  Administered 2018-08-01: 1000 mL via INTRAVENOUS

## 2018-08-01 MED ORDER — ACETAMINOPHEN 650 MG RE SUPP: 650.0000 mg | Freq: Four times a day (QID) | RECTAL | Status: DC | PRN

## 2018-08-01 MED ORDER — ACETAMINOPHEN 325 MG PO TABS
650.0000 mg | ORAL_TABLET | Freq: Four times a day (QID) | ORAL | Status: DC | PRN
  Administered 2018-08-01 – 2018-08-02 (×4): 650 mg via ORAL
  Filled 2018-08-01 (×5): qty 2

## 2018-08-01 MED ORDER — ONDANSETRON HCL 4 MG PO TABS
4.0000 mg | ORAL_TABLET | Freq: Four times a day (QID) | ORAL | Status: DC | PRN
  Administered 2018-08-01 – 2018-08-05 (×8): 4 mg via ORAL
  Filled 2018-08-01 (×8): qty 1

## 2018-08-01 MED ORDER — INSULIN ASPART 100 UNIT/ML ~~LOC~~ SOLN
0.0000 [IU] | Freq: Three times a day (TID) | SUBCUTANEOUS | Status: DC
  Administered 2018-08-02: 2 [IU] via SUBCUTANEOUS
  Administered 2018-08-02: 5 [IU] via SUBCUTANEOUS
  Administered 2018-08-02 – 2018-08-03 (×2): 2 [IU] via SUBCUTANEOUS
  Administered 2018-08-03: 3 [IU] via SUBCUTANEOUS
  Administered 2018-08-03 – 2018-08-04 (×3): 1 [IU] via SUBCUTANEOUS
  Administered 2018-08-04 – 2018-08-05 (×2): 2 [IU] via SUBCUTANEOUS

## 2018-08-01 MED ORDER — INSULIN ASPART 100 UNIT/ML IV SOLN
5.0000 [IU] | Freq: Once | INTRAVENOUS | Status: AC
  Administered 2018-08-01: 5 [IU] via INTRAVENOUS
  Filled 2018-08-01: qty 0.05

## 2018-08-01 MED ORDER — ALBUTEROL SULFATE (2.5 MG/3ML) 0.083% IN NEBU: 2.5000 mg | INHALATION_SOLUTION | Freq: Four times a day (QID) | RESPIRATORY_TRACT | Status: DC | PRN

## 2018-08-01 MED ORDER — HEPARIN SODIUM (PORCINE) 5000 UNIT/ML IJ SOLN
5000.0000 [IU] | Freq: Three times a day (TID) | INTRAMUSCULAR | Status: DC
  Administered 2018-08-01 – 2018-08-05 (×11): 5000 [IU] via SUBCUTANEOUS
  Filled 2018-08-01 (×10): qty 1

## 2018-08-01 MED ORDER — SODIUM CHLORIDE 0.9 % IV SOLN
1.0000 g | Freq: Once | INTRAVENOUS | Status: AC
  Administered 2018-08-01: 1 g via INTRAVENOUS
  Filled 2018-08-01: qty 10

## 2018-08-01 MED ORDER — ALBUTEROL SULFATE HFA 108 (90 BASE) MCG/ACT IN AERS: 2.0000 | INHALATION_SPRAY | RESPIRATORY_TRACT | Status: DC | PRN

## 2018-08-01 MED ORDER — ALBUTEROL SULFATE (2.5 MG/3ML) 0.083% IN NEBU
10.0000 mg | INHALATION_SOLUTION | Freq: Once | RESPIRATORY_TRACT | Status: AC
  Administered 2018-08-01: 10 mg via RESPIRATORY_TRACT
  Filled 2018-08-01: qty 12

## 2018-08-01 MED ORDER — LIDOCAINE 5 % EX PTCH
1.0000 | MEDICATED_PATCH | Freq: Every day | CUTANEOUS | Status: DC | PRN
  Administered 2018-08-01 – 2018-08-04 (×3): 1 via TRANSDERMAL
  Filled 2018-08-01 (×5): qty 1

## 2018-08-01 MED ORDER — FAMOTIDINE 20 MG PO TABS
20.0000 mg | ORAL_TABLET | Freq: Every day | ORAL | Status: DC
  Administered 2018-08-02 – 2018-08-05 (×4): 20 mg via ORAL
  Filled 2018-08-01 (×4): qty 1

## 2018-08-01 MED ORDER — DICLOFENAC SODIUM 1 % TD GEL
2.0000 g | Freq: Two times a day (BID) | TRANSDERMAL | Status: DC
  Administered 2018-08-01 – 2018-08-05 (×7): 2 g via TOPICAL
  Filled 2018-08-01: qty 100

## 2018-08-01 MED ORDER — ALBUTEROL SULFATE (2.5 MG/3ML) 0.083% IN NEBU: 2.5000 mg | INHALATION_SOLUTION | RESPIRATORY_TRACT | Status: DC | PRN

## 2018-08-01 MED ORDER — LACTATED RINGERS IV BOLUS: 500.0000 mL | Freq: Once | INTRAVENOUS | Status: DC

## 2018-08-01 MED ORDER — SODIUM CHLORIDE 0.9% FLUSH: 3.0000 mL | Freq: Once | INTRAVENOUS | Status: DC

## 2018-08-01 MED ORDER — ONDANSETRON HCL 4 MG/2ML IJ SOLN
4.0000 mg | Freq: Four times a day (QID) | INTRAMUSCULAR | Status: DC | PRN
  Administered 2018-08-02: 4 mg via INTRAVENOUS
  Filled 2018-08-01: qty 2

## 2018-08-01 MED ORDER — SODIUM CHLORIDE 0.9 % IV BOLUS: 1000.0000 mL | Freq: Once | INTRAVENOUS | Status: DC

## 2018-08-01 MED ORDER — BUPROPION HCL ER (XL) 300 MG PO TB24
300.0000 mg | ORAL_TABLET | Freq: Every day | ORAL | Status: DC
  Administered 2018-08-02 – 2018-08-05 (×4): 300 mg via ORAL
  Filled 2018-08-01 (×4): qty 1

## 2018-08-01 MED ORDER — ROSUVASTATIN CALCIUM 5 MG PO TABS
5.0000 mg | ORAL_TABLET | Freq: Every day | ORAL | Status: DC
  Administered 2018-08-02 – 2018-08-05 (×4): 5 mg via ORAL
  Filled 2018-08-01 (×4): qty 1

## 2018-08-01 MED ORDER — LACTATED RINGERS IV SOLN
INTRAVENOUS | Status: AC
  Administered 2018-08-01 – 2018-08-02 (×2): via INTRAVENOUS

## 2018-08-01 MED ORDER — IVABRADINE HCL 5 MG PO TABS
5.0000 mg | ORAL_TABLET | Freq: Two times a day (BID) | ORAL | Status: DC
  Administered 2018-08-01: 5 mg via ORAL
  Filled 2018-08-01 (×3): qty 1

## 2018-08-01 MED ORDER — INSULIN ASPART 100 UNIT/ML ~~LOC~~ SOLN
0.0000 [IU] | Freq: Every day | SUBCUTANEOUS | Status: DC
  Administered 2018-08-03: 2 [IU] via SUBCUTANEOUS

## 2018-08-01 MED ORDER — INSULIN GLARGINE 100 UNIT/ML ~~LOC~~ SOLN
20.0000 [IU] | Freq: Every day | SUBCUTANEOUS | Status: DC
  Administered 2018-08-02 – 2018-08-04 (×3): 20 [IU] via SUBCUTANEOUS
  Filled 2018-08-01 (×3): qty 0.2

## 2018-08-01 MED ORDER — DULOXETINE HCL 60 MG PO CPEP
60.0000 mg | ORAL_CAPSULE | Freq: Every day | ORAL | Status: DC
  Administered 2018-08-02 – 2018-08-05 (×4): 60 mg via ORAL
  Filled 2018-08-01 (×4): qty 1

## 2018-08-01 MED ORDER — CARVEDILOL 25 MG PO TABS
25.0000 mg | ORAL_TABLET | Freq: Two times a day (BID) | ORAL | Status: DC
  Administered 2018-08-01: 25 mg via ORAL
  Filled 2018-08-01 (×2): qty 1

## 2018-08-01 MED ORDER — LUBIPROSTONE 24 MCG PO CAPS
24.0000 ug | ORAL_CAPSULE | Freq: Two times a day (BID) | ORAL | Status: DC
  Administered 2018-08-01 – 2018-08-05 (×8): 24 ug via ORAL
  Filled 2018-08-01 (×10): qty 1
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
## 2018-08-01 NOTE — ED Notes (Signed)
Pure wick has been placed. Pt has been educated. Suction set to 45mmHg 

## 2018-08-01 NOTE — ED Notes (Signed)
ED TO INPATIENT HANDOFF REPORT  Name/Age/Gender [name] Vari 48 y.o. female  Code Status Code Status History    Date Active Date Inactive Code Status Order ID Comments User Context   11/26/2013 2302 11/27/2013 1608 Full Code [id number]  Darnell LevelGerkin, Todd, MD Inpatient   11/26/2013 1808 11/26/2013 2302 Full Code [id number]  Darnell LevelGerkin, Todd, MD ED   11/14/2013 1202 11/14/2013 1836 Full Code [id number]  Laurey MoraleMcLean, Dalton S, MD Inpatient   03/13/2012 1642 03/17/2012 1646 Full Code [id number]  Cleora FleetJohnson, Clanford L, MD Inpatient   02/17/2012 0142 02/18/2012 0519 Full Code [id number]  Remi Haggardrawford, Anne, NP ED   07/21/2011 1040 07/28/2011 1331 Full Code [id number]  Ned ClinesHasz, Faith-Marie, RN ED      Home/SNF/Other Home  Chief Complaint syncope   Level of Care/Admitting Diagnosis ED Disposition    None      Medical History Past Medical History:  Diagnosis Date  . Anemia    hx of iron transfusions - last one 10/2013   . Anxiety   . Arthritis    neck and shoulders   . Asthma   . Back pain, chronic   . Bipolar disorder (HCC)   . Carpal tunnel syndrome on both sides   . Cataract   . Depression   . Diabetes mellitus   . Fibromyalgia   . GERD (gastroesophageal reflux disease)   . History of gastric bypass Jan 2013  . Hyperlipidemia   . Hypertension   . Insomnia   . Migraines   . Nervous breakdown sept 2013  . Neuropathy   . Nonischemic cardiomyopathy (HCC)   . OSA (obstructive sleep apnea) 02/21/2015  . Peripheral vascular disease (HCC)   . Shortness of breath    with exertion   . Sleep apnea    borderline- no cpap yet  . Systolic CHF, acute on chronic (HCC)    CARDIOLOGIST-- DR Aris LotKATARINA NELSON  (Corinda GublerLEBAUER)  . Wound of left breast     Allergies Allergies  Allergen Reactions  . Imitrex [Sumatriptan] Other (See Comments)    Acid reflux---pill only-- shots are okay     IV Location/Drains/Wounds Patient Lines/Drains/Airways Status   Active Line/Drains/Airways    Name:   Placement date:   Placement time:    Site:   Days:   Peripheral IV 08/01/18 Left Arm   08/01/18    -    Arm   less than 1   Peripheral IV 08/01/18 Right;Posterior Forearm   08/01/18    1146    Forearm   less than 1   External Urinary Catheter   08/01/18    1321    -   less than 1          Labs/Imaging Results for orders placed or performed during the hospital encounter of 08/01/18 (from the past 48 hour(s))  Urinalysis, Routine w reflex microscopic     Status: Abnormal   Collection Time: 08/01/18 10:49 AM  Result Value Ref Range   Color, Urine YELLOW YELLOW   APPearance HAZY (A) CLEAR   Specific Gravity, Urine 1.010 1.005 - 1.030   pH 5.0 5.0 - 8.0   Glucose, UA 50 (A) NEGATIVE mg/dL   Hgb urine dipstick SMALL (A) NEGATIVE   Bilirubin Urine NEGATIVE NEGATIVE   Ketones, ur NEGATIVE NEGATIVE mg/dL   Protein, ur NEGATIVE NEGATIVE mg/dL   Nitrite NEGATIVE NEGATIVE   Leukocytes,Ua SMALL (A) NEGATIVE   RBC / HPF 0-5 0 - 5 RBC/hpf   WBC, UA 21-50 0 - 5  WBC/hpf   Bacteria, UA RARE (A) NONE SEEN   Squamous Epithelial / LPF 0-5 0 - 5   Mucus PRESENT    Hyaline Casts, UA PRESENT     Comment: Performed at [facility], [address], [place], [place] [address]  CBG monitoring, ED     Status: Abnormal   Collection Time: 08/01/18 10:54 AM  Result Value Ref Range   Glucose-Capillary 239 (H) 70 - 99 mg/dL  Blood gas, venous (at [facility] and [facility])     Status: Abnormal   Collection Time: 08/01/18 11:30 AM  Result Value Ref Range   pH, Ven 7.294 7.250 - 7.430   pCO2, Ven 47.8 44.0 - 60.0 mmHg   pO2, Ven 33.8 32.0 - 45.0 mmHg   Bicarbonate 22.4 20.0 - 28.0 mmol/L   Acid-base deficit 3.8 (H) 0.0 - 2.0 mmol/L   O2 Saturation 58.8 %   Patient temperature 98.6    Collection site VEIN    Drawn by DRAWN BY RN    Sample type VENOUS     Comment: Performed at [facility], [address] [place], [place] [address]  CBC     Status: Abnormal   Collection Time: 08/01/18 11:40 AM  Result Value Ref  Range   WBC 11.6 (H) 4.0 - 10.5 K/uL   RBC 4.15 3.87 - 5.11 MIL/uL   Hemoglobin 12.2 12.0 - 15.0 g/dL   HCT 41.9 62.2 - 29.7 %   MCV 94.9 80.0 - 100.0 fL   MCH 29.4 26.0 - 34.0 pg   MCHC 31.0 30.0 - 36.0 g/dL   RDW 98.9 21.1 - 94.1 %   Platelets 218 150 - 400 K/uL   nRBC 0.0 0.0 - 0.2 %    Comment: Performed at [facility], [address] [place], [place] [address]  Comprehensive metabolic panel     Status: Abnormal   Collection Time: 08/01/18 11:40 AM  Result Value Ref Range   Sodium 135 135 - 145 mmol/L   Potassium 5.9 (H) 3.5 - 5.1 mmol/L   Chloride 108 98 - 111 mmol/L   CO2 17 (L) 22 - 32 mmol/L   Glucose, Bld 252 (H) 70 - 99 mg/dL   BUN 42 (H) 6 - 20 mg/dL   Creatinine, Ser 4.48 (H) 0.44 - 1.00 mg/dL   Calcium 9.0 8.9 - 18.5 mg/dL   Total Protein 7.5 6.5 - 8.1 g/dL   Albumin 4.0 3.5 - 5.0 g/dL   AST 62 (H) 15 - 41 U/L   ALT 63 (H) 0 - 44 U/L   Alkaline Phosphatase 139 (H) 38 - 126 U/L   Total Bilirubin 0.4 0.3 - 1.2 mg/dL   GFR calc non Af Amer 43 (L) >60 mL/min   GFR calc Af Amer 50 (L) >60 mL/min   Anion gap 10 5 - 15    Comment: Performed at [facility], [address] [place], [place] [address]  Lactic acid, plasma     Status: None   Collection Time: 08/01/18 11:57 AM  Result Value Ref Range   Lactic Acid, Venous 1.6 0.5 - 1.9 mmol/L    Comment: Performed at [facility], [address] [place], [place] [address]  I-Stat beta hCG blood, ED     Status: None   Collection Time: 08/01/18 12:28 PM  Result Value Ref Range   I-stat hCG, quantitative <5.0 <5 mIU/mL   Comment 3  Comment:   GEST. AGE      CONC.  (mIU/mL)   <=1 WEEK        5 - 50     2 WEEKS       50 - 500     3 WEEKS       100 - 10,000     4 WEEKS     1,000 - 30,000        FEMALE AND NON-PREGNANT FEMALE:     LESS THAN 5 mIU/mL   Ethanol     Status: None   Collection Time: 08/01/18 12:41 PM  Result Value Ref Range   Alcohol,  Ethyl (B) <10 <10 mg/dL    Comment: (NOTE) Lowest detectable limit for serum alcohol is 10 mg/dL. For medical purposes only. Performed at [facility], [address] [place], [place] [address]   Ammonia     Status: Abnormal   Collection Time: 08/01/18  1:09 PM  Result Value Ref Range   Ammonia 40 (H) 9 - 35 umol/L    Comment: Performed at [facility], [address] [place] [address], [place], [place] [address]  Lactic acid, plasma     Status: None   Collection Time: 08/01/18  1:10 PM  Result Value Ref Range   Lactic Acid, Venous 1.9 0.5 - 1.9 mmol/L    Comment: Performed at [facility], [address], [place], [place] [address]   Dg Chest 2 View  Result Date: 08/01/2018 CLINICAL DATA:  Shortness of breath and syncope this morning. EXAM: CHEST - 2 VIEW COMPARISON:  PA and lateral chest 02/01/2017. FINDINGS: The lungs are clear. Heart size is normal. No pneumothorax or pleural fluid. Spinal stimulator is in place. No acute or focal bony abnormality. IMPRESSION: Negative chest. Electronically Signed   By: Drusilla Kanner M.D.   On: 08/01/2018 12:03   Ct Head Wo Contrast  Result Date: 08/01/2018 CLINICAL DATA:  Altered level of consciousness. EXAM: CT HEAD WITHOUT CONTRAST TECHNIQUE: Contiguous axial images were obtained from the base of the skull through the vertex without intravenous contrast. COMPARISON:  CT scan of June 04, 2013. FINDINGS: Brain: Right occipital encephalomalacia is noted consistent with old infarction. No mass effect or midline shift is noted. Ventricular size is within normal limits. There is no evidence of mass lesion, hemorrhage or acute infarction. Vascular: No hyperdense vessel or unexpected calcification. Skull: Normal. Negative for fracture or focal lesion. Sinuses/Orbits: No acute finding. Other: None. 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 No acute intracranial abnormality seen Electronically Signed   By: Lupita Raider, M.D.   On: 08/01/2018 12:45   EKG Interpretation  Date/Time:  Monday August 01 2018 10:50:20 EST Ventricular Rate:  59 PR Interval:    QRS Duration: 101 QT Interval:  437 QTC Calculation: 433 R Axis:   56 Text Interpretation:  Sinus rhythm Minimal ST depression, inferior leads Borderline ST elevation, lateral leads isolated flipped t in lead III seen on prior Otherwise no significant change Confirmed by Melene Plan [id number]) on 08/01/2018 11:03:46 AM   Pending Labs Unresulted Labs (From admission, onward)   None      Vitals/Pain Today's Vitals   08/01/18 1230 08/01/18 1300 08/01/18 1330 08/01/18 1400  BP: 109/72 106/71 112/79 120/81  Pulse: (!) 59 64  62  Resp: 17 13 17 13   Temp:      TempSrc:      SpO2: 100% 99%  100%    Isolation Precautions No  active isolations  Medications Medications  sodium chloride flush (NS) 0.9 % injection 3 mL ( Intravenous Canceled Entry 08/01/18 1429)  calcium chloride 1 g in sodium chloride 0.9 % 100 mL IVPB (has no administration in time range)  insulin aspart (novoLOG) injection 5 Units (has no administration in time range)  sodium chloride 0.9 % bolus 1,000 mL (1,000 mLs Intravenous Bolus 08/01/18 1429)  sodium chloride 0.9 % bolus 1,000 mL (1,000 mLs Intravenous Bolus 08/01/18 1147)  albuterol (PROVENTIL) (2.5 MG/3ML) 0.083% nebulizer solution 10 mg (10 mg Nebulization Given 08/01/18 1427)    Mobility walks with person assist

## 2018-08-01 NOTE — ED Notes (Signed)
Pt transported to xray 

## 2018-08-01 NOTE — ED Notes (Signed)
Bed: WA08 Expected date:  Expected time:  Means of arrival:  Comments: EMS 

## 2018-08-01 NOTE — Progress Notes (Signed)
This RN assumed care of patient. I agree with previous RN's assessment.

## 2018-08-01 NOTE — H&P (Signed)
**Note De-Identified vi Obfusction** History nd Physicl    [name] IZT:[id number] DOB: 11/30/1970 DOA: 08/01/2018  PCP: Linda Fer, NP  Ptient coming from: home  I hve personlly briefly reviewed ptient's old medicl records in [facility]  Chief Complint: Syncope  HPI: [name] is  48 y.o. femle with medicl history significnt of hert filure with reduced ejection frction, hypertension, hyperlipidemi, type 2 dibetes, bipolr disorder, fibromylgi, nd multiple other medicl problems presenting fter n episode of syncope leding to  cr ccident.  She notes he ws [place], felt  senstion of wrmth.  She rolled down her window to get ir nd ws going to pull over, but the next thing she knew she pssed out.  She got into  cr ccident, but does not recll nything bout the ccident.  She fell sleep sometime fter wking up on the side of the rod.  The next thing tht she remembers is the police knocking on her window.  She notes tht she is pssed out 3 times in the pst few weeks.  Ech time she hs  senstion of wrmth before it hppens.  Her fmily notes tht she is out only for  second or 2.  He denies ny recent chnges to her medictions.  She notes tht the lst time tht she took her pin medicine she thinks ws lst Fridy.  She notes recently tht she is more tired.  She describes  generl mlise.  She notes hedches, relieved with ibuprofen.  She denies ny fevers.  She notes occsionl chills.  She notes  cough recently tht hs improved.  She denies shortness of breth, chest pin, bdominl pin.  She hs some nuse, without vomiting.  ED Course: Lbs, imging, IV fluids.  Admit to hospitlist for recurrent syncope.  Review of Systems: As per HPI otherwise 10 point review of systems negtive.   Pst Medicl History:  Dignosis Dte  . Anemi    hx of iron trnsfusions - lst one 10/2013   . Anxiety   . Arthritis    neck nd shoulders   . Asthm   . Bck  pin, chronic   . Bipolr disorder (HCC)   . Crpl tunnel syndrome on both sides   . Ctrct   . Depression   . Dibetes mellitus   . Fibromylgi   . GERD (gstroesophgel reflux disese)   . History of gstric bypss Jn 2013  . Hyperlipidemi   . Hypertension   . Insomni   . Migrines   . Nervous brekdown sept 2013  . Neuropthy   . Nonischemic crdiomyopthy (HCC)   . OSA (obstructive sleep pne) 02/21/2015  . Peripherl vsculr disese (HCC)   . Shortness of breth    with exertion   . Sleep pne    borderline- no cpp yet  . Systolic CHF, cute on chronic (HCC)    CARDIOLOGIST-- DR Aris Lot NELSON  ([facility])  . Wound of left brest     Pst Surgicl History:  Procedure Lterlity Dte  . BACK SURGERY  November 1992  . BREAST SURGERY Left    I&D for bcsess   . CARDIAC CATHETERIZATION  11-14-2013  DR Mrc Ancon   NORMAL LV FILLING PRESSURE W/ MILD ELEVATED RV FILLING, POSSIBLY SUFFESTING RV FAILURE OUT OF PROPORTION TO LV FAILURE. EF ABOUT 35% WITH DIFFUSE HYPOKINESIS. NO ANGIOGRAPHIC CAD, NONISCHEMIC CARDIOMYOPATHY  . CESAREAN SECTION  December 1998  . CHOLECYSTECTOMY  1998  . DILITATION & CURRETTAGE/HYSTROSCOPY WITH NOVASURE ABLATION **Note De-Identified vi Obfusction** N/ 02/26/2014   Procedure: DILTTION & CURETTGE/HYSTEROSCOPY WITH NOVSURE BLTION;  Surgeon: Meriel Pic, MD;  Loction: [facility] ORS;  Service: Gynecology;  Lterlity: N/;  . ESOPHGOGSTRODUODENOSCOPY  07/27/2011   Procedure: ESOPHGOGSTRODUODENOSCOPY (EGD);  Surgeon: Freddy Jksch, MD;  Loction: [facility] ENDOS[facility];  Service: Endoscopy;  Lterlity: N/;  . GSTRIC ROUX-EN-Y  06/30/2011   Procedure: LPROSCOPIC ROUX-EN-Y GSTRIC;  Surgeon: Mriell S, MD;  Loction: [facility] ORS;  Service: Generl;  Lterlity: N/;     . INCISION ND DRINGE OF WOUND Left 12/14/2013   Procedure: IRRIGTION ND DEBRIDEMENT OF LEFT BREST WOUND WITH PLCEMENT OF -CELL, WITH CLOSURE ;  Surgeon: Wylnd Denis, DO;  Loction: [facility] ORS;   Service: Plstics;  Lterlity: Left;  . IRRIGTION ND DEBRIDEMENT BSCESS Left 11/26/2013   Procedure: IRRIGTION ND DEBRIDEMENT BSCESS;  Surgeon: Velor Heckler, MD;  Loction: [facility] ORS;  Service: Generl;  Lterlity: Left;  . LEFT ND RIGHT HERT CTHETERIZTION WITH CORONRY NGIOGRM N/ 11/14/2013   Procedure: LEFT ND RIGHT HERT CTHETERIZTION WITH CORONRY NGIOGRM;  Surgeon: Lurey Morle, MD;  Loction: [facility] CTH LB;  Service: Crdiovsculr;  Lterlity: N/;  . SPINL CORD STIMULTOR INSERTION N/ 03/09/2014   Procedure: LUMBR SPINL CORD STIMULTOR INSERTION;  Surgeon: Gwynne Edinger, MD;  Loction: [facility] NEURO ORS;  Service: Neurosurgery;  Lterlity: N/;  . TUBL LIGTION  Jnury 1999     reports tht she hs never smoked. She hs never used smokeless tobcco. She reports tht she does not drink lcohol or use drugs.  llergies  llergen Rections  . Imitrex [Sumtriptn] Other (See Comments)    cid reflux---pill only-- shots re oky     Fmily History  Problem Reltion ge of Onset  . Hypertension Mother   . Dibetes Mternl unt   . Dibetes Pternl unt   . Dibetes Pternl Grndfther   . Hypertension Pternl Grndfther   . Dibetes Pternl Grndmother   . Hert disese Brother   . Hert ttck Neg Hx   . Stroke Neg Hx   . Colon cncer Neg Hx   . Colon polyps Neg Hx   . Esophgel cncer Neg Hx   . Rectl cncer Neg Hx   . Stomch cncer Neg Hx   . Brest cncer Neg Hx    Prior to dmission medictions   Mediction Sig Strt Dte End Dte Tking? uthorizing Provider  mLODipine (NORVSC) 10 MG tblet Tke 1 tblet (10 mg totl) by mouth dily. 08/19/17 08/01/18 Yes TilleryMrim Dollr, P-C  Biotin 5000 MCG TBS Tke 5,000 mcg by mouth dily.   Yes Provider, Historicl, MD  buPROPion (WELLBUTRIN XL) 300 MG 24 hr tblet Tke 300 mg by mouth dily before brekfst.  02/23/13  Yes Provider, Historicl, MD  crvedilol (COREG) 25 MG tblet TKE 1  TBLET BY MOUTH TWICE  DY WITH MELS 07/18/18  Yes Lurey Morle, MD  Choleclciferol (VITMIN D3) 5000 units TBS Tke 5,000 Units by mouth dily.    Yes Provider, Historicl, MD  CORLNOR 5 MG TBS tblet TKE 1 TBLET BY MOUTH 2 (TWO) TIMES DILY WITH  MEL. Ptient tking differently: Tke 5 mg by mouth 2 (two) times dily with  mel.  03/23/18  Yes Lurey Morle, MD  cyclobenzprine (FLEXERIL) 10 MG tblet Tke 10 mg by mouth 3 (three) times dily s needed for muscle spsms.  11/23/13  Yes Provider, Historicl, MD  diclofenc sodium (VOLTREN) 1 % GEL pply 2 g topiclly 2 (two) times dily. **Note De-Identified vi Obfusction** Yes Provider, Historicl, MD  DULoxetine (CYMBALTA) 60 MG cpsule Tke 60 mg by mouth dily.   Yes Provider, Historicl, MD  ENTRESTO 97-103 MG TAKE 1 TABLET BY MOUTH 2 (TWO) TIMES DAILY. Ptient tking differently: Tke 1 tblet by mouth 2 (two) times dily.  04/11/18  Yes Lurey MorleMcLen, Dlton S, MD  fluticsone [facility](FLONASE) 50 MCG/ACT nsl spry Plce 1 spry into both nostrils dily.  03/07/13  Yes Provider, Historicl, MD  ibuprofen (ADVIL,MOTRIN) 800 MG tblet Tke 800 mg by mouth every 8 (eight) hours s needed (pin).    Yes Provider, Historicl, MD  Insulin Glrgine (LANTUS SOLOSTAR) 100 UNIT/ML Solostr Pen Inject 36 Units into the skin dily.    Yes Provider, Historicl, MD  lidocine (LIDODERM) 5 % Plce 1 ptch onto the skin dily s needed (for bck pin).  03/21/13  Yes Provider, Historicl, MD  lubiprostone (AMITIZA) 24 MCG cpsule Tke 24 mcg by mouth 2 (two) times dily s needed for constiption.   Yes Provider, Historicl, MD  mgnesium oxide (MAG-OX) 400 MG tblet TAKE 1 TABLET BY MOUTH EVERY DAY 05/25/18  Yes Bensimhon, Bevelyn Bucklesniel R, MD  oxyCODONE-cetminophen (PERCOCET) 10-325 MG per tblet Tke 0.5-1 tblets by mouth every 4 (four) hours s needed for pin.   Yes Provider, Historicl, MD  polyethylene glycol (MIRALAX / GLYCOLAX) pcket Tke 17 g by mouth 3 (three) times  week.   Yes  Provider, Historicl, MD  potssium chloride (K-DUR) 10 MEQ tblet TAKE 4 TABLETS (40 MEQ TOTAL) BY MOUTH 3 (THREE) TIMES DAILY. 02/21/18 08/01/18 Yes Tillery, Mrim DollrMichel Andrew, PA-C  pregblin (LYRICA) 150 MG cpsule Tke 150 mg by mouth 3 (three) times dily.   Yes Provider, Historicl, MD  rnitidine (ZANTAC) 150 MG tblet Tke 150 mg by mouth dily. 02/11/13  Yes Provider, Historicl, MD  rosuvsttin (CRESTOR) 5 MG tblet Tke 1 tblet (5 mg totl) by mouth dily. 11/23/17 08/01/18 Yes Lurey MorleMcLen, Dlton S, MD  spironolctone (ALDACTONE) 25 MG tblet TAKE 1 TABLET BY MOUTH EVERY DAY 07/15/18  Yes Bensimhon, Bevelyn Bucklesniel R, MD  torsemide (DEMADEX) 20 MG tblet Tke 4 tblets (80 mg totl) by mouth dily. 05/31/18  Yes Lurey MorleMcLen, Dlton S, MD  VENTOLIN HFA 108 (90 BASE) MCG/ACT inhler INHALE 2 PUFFS BY MOUTH EVERY 4 TO 6 HOURS AS NEEDED FOR FOR SHORTNESS OF BREATH OR WHEEZING Ptient tking differently: Inhle 2 puffs into the lungs. Every 4 to 6 hours s needed for wheezing or shortness of breth 02/07/15  Yes Lrs MssonNelson, Ktrin H, MD  VICTOZA 18 MG/3ML SOPN Inject 1.8 mg into the skin dily. 03/14/16  Yes Provider, Historicl, MD    Physicl Exm: Vitls:   08/01/18 1453 08/01/18 1500 08/01/18 1618 08/01/18 1642  BP: 126/87 136/83 123/81   Pulse: 64 69 66   Resp: 12 11    Temp:   97.6 F (36.4 C)   TempSrc:   Orl   SpO2: 100% 100% 100%   Weight:    86.1 kg  Height:    5\' 5"  (1.651 m)    Constitutionl: NAD, clm, comfortble Vitls:   08/01/18 1453 08/01/18 1500 08/01/18 1618 08/01/18 1642  BP: 126/87 136/83 123/81   Pulse: 64 69 66   Resp: 12 11    Temp:   97.6 F (36.4 C)   TempSrc:   Orl   SpO2: 100% 100% 100%   Weight:    86.1 kg  Height:    5\' 5"  (1.651 m)   Eyes: PERRL, lids nd conjunctive norml ENMT:  Mucous membranes are moist. Posterior pharynx clear of any exudate or lesions.Normal dentition.  Neck: normal, supple, no masses, no thyromegaly Respiratory: clear to auscultation  bilaterally, no wheezing, no crackles. Normal respiratory effort. No accessory muscle use.  Cardiovascular: Regular rate and rhythm, no murmurs / rubs / gallops. No extremity edema.  bdomen: no tenderness, no masses palpated. No hepatosplenomegaly. Bowel sounds positive.  Musculoskeletal: no clubbing / cyanosis. No joint deformity upper and lower extremities. Good ROM, no contractures. Normal muscle tone.  Skin: no rashes, lesions, ulcers. No induration Neurologic: CN 2-12 intact.  bit sleepy, but alert and appropriate.  Sensation intact. Strength 5/5 in all 4.  Psychiatric: Normal judgment and insight. lert and oriented x 3. Normal mood.   Labs on dmission: I have personally reviewed following labs and imaging studies  CBC: Recent Labs  Lab 08/01/18 1140  WBC 11.6*  HGB 12.2  HCT 39.4  MCV 94.9  PLT 218   Basic Metabolic Panel: Recent Labs  Lab 08/01/18 1140  N 135  K 5.9*  CL 108  CO2 17*  GLUCOSE 252*  BUN 42*  CRETININE 1.44*  CLCIUM 9.0   GFR: Estimated Creatinine Clearance: 52.3 mL/min () (by C-G formula based on SCr of 1.44 mg/dL (H)). Liver Function Tests: Recent Labs  Lab 08/01/18 1140  ST 62*  LT 63*  LKPHOS 139*  BILITOT 0.4  PROT 7.5  LBUMIN 4.0   No results for input(s): LIPSE, MYLSE in the last 168 hours. Recent Labs  Lab 08/01/18 1309  MMONI 40*   Coagulation Profile: No results for input(s): INR, PROTIME in the last 168 hours. Cardiac Enzymes: No results for input(s): CKTOTL, CKMB, CKMBINDEX, TROPONINI in the last 168 hours. BNP (last 3 results) No results for input(s): PROBNP in the last 8760 hours. Hb1C: No results for input(s): HGB1C in the last 72 hours. CBG: Recent Labs  Lab 08/01/18 1054 08/01/18 1623  GLUCP 239* 92   Lipid Profile: No results for input(s): CHOL, HDL, LDLCLC, TRIG, CHOLHDL, LDLDIRECT in the last 72 hours. Thyroid Function Tests: No results for input(s): TSH, T4TOTL, FREET4, T3FREE,  THYROIDB in the last 72 hours. nemia Panel: No results for input(s): VITMINB12, FOLTE, FERRITIN, TIBC, IRON, RETICCTPCT in the last 72 hours. Urine analysis:    Component Value Date/Time   COLORURINE YELLOW 08/01/2018 1049   PPERNCEUR HZY () 08/01/2018 1049   LBSPEC 1.010 08/01/2018 1049   PHURINE 5.0 08/01/2018 1049   GLUCOSEU 50 () 08/01/2018 1049   HGBUR SMLL () 08/01/2018 1049   BILIRUBINUR NEGTIVE 08/01/2018 1049   KETONESUR NEGTIVE 08/01/2018 1049   PROTEINUR NEGTIVE 08/01/2018 1049   UROBILINOGEN 0.2 05/02/2014 1015   NITRITE NEGTIVE 08/01/2018 1049   LEUKOCYTESUR SMLL () 08/01/2018 1049    Radiological Exams on dmission: Dg Chest 2 View  Result Date: 08/01/2018 CLINICL DT:  Shortness of breath and syncope this morning. EXM: CHEST - 2 VIEW COMPRISON:  P and lateral chest 02/01/2017. FINDINGS: The lungs are clear. Heart size is normal. No pneumothorax or pleural fluid. Spinal stimulator is in place. No acute or focal bony abnormality. IMPRESSION: Negative chest. Electronically Signed   By: Drusilla Kannerhomas  Dalessio M.D.   On: 08/01/2018 12:03   Ct Head Wo Contrast  Result Date: 08/01/2018 CLINICL DT:  ltered level of consciousness. EXM: CT HED WITHOUT CONTRST TECHNIQUE: Contiguous axial images were obtained from the base of the skull through the vertex without intravenous contrast. COMPRISON:  CT scan of June 04, 2013. FINDINGS: Brain:  Right occipital encephalomalacia is noted consistent with old infarction. No mass effect or midline shift is noted. Ventricular size is within normal limits. There is no evidence of mass lesion, hemorrhage or acute infarction. Vascular: No hyperdense vessel or unexpected calcification. Skull: Normal. Negative for fracture or focal lesion. Sinuses/Orbits: No acute finding. Other: None. 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 No acute intracranial abnormality seen Electronically Signed   By: Lupita Raider, M.D.   On:  08/01/2018 12:45   [place] bdomen Limited Ruq  Result Date: 08/01/2018 CLINICL DT:  Elevated LFTs EXM: ULTRSOUND BDOMEN LIMITED RIGHT UPPER QUDRNT COMPRISON:  None. FINDINGS: Gallbladder: Surgically absent Common bile duct: Diameter: 3.2 mm Liver: No focal lesion identified. Within normal limits in parenchymal echogenicity. Portal vein is patent on color Doppler imaging with normal direction of blood flow towards the liver. IMPRESSION: Previous cholecystectomy.  No other abnormalities. Electronically Signed   By: Gerome Sam III M.D   On: 08/01/2018 16:15    EKG: Independently reviewed. Sinus rhythm, compared to priors, appears similar.  T wave inversion in lead III, isolated.   ssessment/Plan ctive Problems:   Syncope   Syncope: recurrent syncope in the past few weeks.  Lasts seconds.  Preceded by feeling of warmth.  Sounds like reflex syncope, but today occurred in car while seated (which leads to some concern for cardiac etiology as well).  Suspect some autonomic dysfunction with DM.  Wonder as well if she may be volume depleted on her diuretics.  EKG appears similar to priors Positive orthostatics with >20 pt drop in SBP and symptoms when standing Repeat orthostatics in M Continue IVF and hold diuretics for now Telemetry  Echo from 11/2017 with EF 45-50% as well as diastolic dysfunction (grade 2), if persistent sx, consider repeating this **She should not drive at discharge**  cute Metabolic Encephalopathy: pt somewhat drowsy on exam, but &Ox3.  She denies any recent opiates.  Will hold lyrica, flexeril, and opiates for now.  VBG with normal pH.  She did have mildly elevated LFTs and ammonia, but no hx of cirrhosis.   Follow urine tox Head CT without acute findings.  Old infarction. RUQ [place] without concerning findings If not improving, consider trial of lactulose, additional w/u  cute Kidney Injury  NGM  Mild Hyperkalemia: likely 2/2 hypovolemia in setting of positive  orthostatics.  Holding diuretics.  IVF as noted above.  Follow.  She was also taking NSIDs for her headaches, which could contribute.  Elevated Liver Enzymes: follow  Pyuria: no UTI sx, but 21-50 WBC on U  HFrEF: Hx combined systolic/diastolic HF.  EF 45-50% in 11/2017 with grade 2 diastolic dysfunction as well.  EF in 2015 was 20-25%. Continue corlanor and coreg Hold enteresto, torsemide, and spironolactone  T2DM: SSI, continue lantus at reduced dose.  Holding home victoza.  Fibromyalgia  Chronic Pain: holding lyrica, flexeril, oxycodone as noted above.  Continue cymbalta and voltaren and lidocaine patch  sthma: continue nebs  Bipolar Disorder: continue amitiza and wellbutrin  GERD: continue H2 blocker  DVT prophylaxis: heparin  Code Status: full  Family Communication: mother, son at bedside  Disposition Plan: pending improvement  Consults called: none  dmission status: obs   Lacretia Nicks MD [facility] Pager MION  If 7PM-7M, please contact night-coverage [email or web address] Password [facility]  08/01/2018, 5:38 PM
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
## 2018-08-02 ENCOUNTER — Observation Stay (HOSPITAL_BASED_OUTPATIENT_CLINIC_OR_DEPARTMENT_OTHER): Payer: Medicare HMO

## 2018-08-02 DIAGNOSIS — R55 Syncope and collapse: Secondary | ICD-10-CM

## 2018-08-02 DIAGNOSIS — I5022 Chronic systolic (congestive) heart failure: Secondary | ICD-10-CM

## 2018-08-02 DIAGNOSIS — N179 Acute kidney failure, unspecified: Secondary | ICD-10-CM

## 2018-08-02 DIAGNOSIS — E1142 Type 2 diabetes mellitus with diabetic polyneuropathy: Secondary | ICD-10-CM

## 2018-08-02 LAB — CBC
HCT: 37.9 % (ref 36.0–46.0)
Hemoglobin: 11.7 g/dL — ABNORMAL LOW (ref 12.0–15.0)
MCH: 29.9 pg (ref 26.0–34.0)
MCHC: 30.9 g/dL (ref 30.0–36.0)
MCV: 96.9 fL (ref 80.0–100.0)
PLATELETS: 221 10*3/uL (ref 150–400)
RBC: 3.91 MIL/uL (ref 3.87–5.11)
RDW: 12.5 % (ref 11.5–15.5)
WBC: 8.8 10*3/uL (ref 4.0–10.5)
nRBC: 0 % (ref 0.0–0.2)

## 2018-08-02 LAB — GLUCOSE, CAPILLARY
GLUCOSE-CAPILLARY: 132 mg/dL — AB (ref 70–99)
Glucose-Capillary: 169 mg/dL — ABNORMAL HIGH (ref 70–99)
Glucose-Capillary: 295 mg/dL — ABNORMAL HIGH (ref 70–99)
Glucose-Capillary: 156 mg/dL — ABNORMAL HIGH (ref 70–99)

## 2018-08-02 LAB — COMPREHENSIVE METABOLIC PANEL
ALT: 50 U/L — ABNORMAL HIGH (ref 0–44)
AST: 39 U/L (ref 15–41)
Albumin: 3.5 g/dL (ref 3.5–5.0)
Alkaline Phosphatase: 119 U/L (ref 38–126)
Anion gap: 9 (ref 5–15)
BUN: 46 mg/dL — ABNORMAL HIGH (ref 6–20)
CO2: 20 mmol/L — ABNORMAL LOW (ref 22–32)
Calcium: 8.9 mg/dL (ref 8.9–10.3)
Chloride: 108 mmol/L (ref 98–111)
Creatinine, Ser: 1.19 mg/dL — ABNORMAL HIGH (ref 0.44–1.00)
GFR calc non Af Amer: 54 mL/min — ABNORMAL LOW (ref >60)
Glucose, Bld: 238 mg/dL — ABNORMAL HIGH (ref 70–99)
Potassium: 4.2 mmol/L (ref 3.5–5.1)
Sodium: 137 mmol/L (ref 135–145)
TOTAL PROTEIN: 6.8 g/dL (ref 6.5–8.1)
Total Bilirubin: 0.2 mg/dL — ABNORMAL LOW (ref 0.3–1.2)

## 2018-08-02 LAB — ECHOCARDIOGRAM COMPLETE
Height: 65 in
Weight: 3038.4 oz

## 2018-08-02 LAB — HIV ANTIBODY (ROUTINE TESTING W REFLEX): HIV Screen 4th Generation wRfx: NONREACTIVE

## 2018-08-02 MED ORDER — IBUPROFEN 600 MG PO TABS
600.0000 mg | ORAL_TABLET | Freq: Three times a day (TID) | ORAL | Status: DC | PRN
  Administered 2018-08-02 – 2018-08-05 (×6): 600 mg via ORAL
  Filled 2018-08-02 (×6): qty 1

## 2018-08-02 MED ORDER — PREGABALIN 75 MG PO CAPS
150.0000 mg | ORAL_CAPSULE | Freq: Three times a day (TID) | ORAL | Status: DC
  Administered 2018-08-02 – 2018-08-05 (×8): 150 mg via ORAL
  Filled 2018-08-02 (×8): qty 2

## 2018-08-02 NOTE — Progress Notes (Signed)
Called by RN regarding chronic pain issues.  Because of her syncope, her blood pressure and pain control medications were held including Percocet 10 mg tablets, Lyrica, ibuprofen 800 mg.  The patient is having significant problems with her chronic pain.  When she was transferred from [facility], she was transferred to our service.  I reviewed the medications with the pharmacist. Lyrica can cause some CNS affects, but no syncope is noted>>will restart. Renal function does not preclude Ibuprofen, so will start that back at decreased dose, 600 mg q 8 hr prn.  No narcotics.  Theodore Demark, PA-C 08/02/2018 7:03 PM Beeper [phone]
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
## 2018-08-02 NOTE — Care Management Obs Status (Signed)
MEDICARE OBSERVATION STATUS NOTIFICATION   Patient Details  Name: [name] MRN: [id number] Date of Birth: Jan 07, 1971   Medicare Observation Status Notification Given:       Lanier Clam, RN 08/02/2018, 3:28 PM

## 2018-08-02 NOTE — Progress Notes (Signed)
Pt received to room 3e24 from [facility] via carelink, pt able to ambulate to bed, vss, CCMD called to verify telemetry, pt reports chronic pain  And the tylenol helps but asking if she can get any of her home meds? I advised they  May be holding some due to her syncope but will paged on call provider. She is asking if she can have at least ibuprofen or flexeril. Oriented to poc , room and call light in reach, pt called in dinner request.

## 2018-08-02 NOTE — Progress Notes (Signed)
  Echocardiogram 2D Echocardiogram has been performed.  [name] 08/02/2018, 3:01 PM

## 2018-08-02 NOTE — Progress Notes (Addendum)
Inpatient Diabetes Program Recommendations  AACE/ADA: New Consensus Statement on Inpatient Glycemic Control (2015)  Target Ranges:  Prepandial:   less than 140 mg/dL      Peak postprandial:   less than 180 mg/dL (1-2 hours)      Critically ill patients:  140 - 180 mg/dL   Lab Results  Component Value Date   GLUCAP 169 (H) 08/02/2018   HGBA1C 9.7 (A) 09/18/2014    Review of Glycemic Control  Diabetes history: DM2 Outpatient Diabetes medications: Lantus 36 units QD, Victoza 1.8 mg QD Current orders for Inpatient glycemic control: Lantus 20 units QD, Novolog 0-9 units tidwc and hs  FBS - 238 mg/dL    Inpatient Diabetes Program Recommendations:     Increase Lantus to 25 units QD Needs updated HgbA1C  Continue to follow trends.   Thank you. Ailene Ards, RD, LDN, CDE Inpatient Diabetes Coordinator [phone]
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
## 2018-08-03 DIAGNOSIS — Z833 Family history of diabetes mellitus: Secondary | ICD-10-CM | POA: Diagnosis not present

## 2018-08-03 DIAGNOSIS — Y92411 Interstate highway as the place of occurrence of the external cause: Secondary | ICD-10-CM | POA: Diagnosis not present

## 2018-08-03 DIAGNOSIS — Z9049 Acquired absence of other specified parts of digestive tract: Secondary | ICD-10-CM | POA: Diagnosis not present

## 2018-08-03 DIAGNOSIS — Z79899 Other long term (current) drug therapy: Secondary | ICD-10-CM | POA: Diagnosis not present

## 2018-08-03 DIAGNOSIS — Z791 Long term (current) use of non-steroidal anti-inflammatories (NSAID): Secondary | ICD-10-CM | POA: Diagnosis not present

## 2018-08-03 DIAGNOSIS — I110 Hypertensive heart disease with heart failure: Secondary | ICD-10-CM | POA: Diagnosis present

## 2018-08-03 DIAGNOSIS — E1151 Type 2 diabetes mellitus with diabetic peripheral angiopathy without gangrene: Secondary | ICD-10-CM | POA: Diagnosis present

## 2018-08-03 DIAGNOSIS — F319 Bipolar disorder, unspecified: Secondary | ICD-10-CM | POA: Diagnosis present

## 2018-08-03 DIAGNOSIS — Z7952 Long term (current) use of systemic steroids: Secondary | ICD-10-CM | POA: Diagnosis not present

## 2018-08-03 DIAGNOSIS — R55 Syncope and collapse: Secondary | ICD-10-CM | POA: Diagnosis present

## 2018-08-03 DIAGNOSIS — G4733 Obstructive sleep apnea (adult) (pediatric): Secondary | ICD-10-CM | POA: Diagnosis present

## 2018-08-03 DIAGNOSIS — I428 Other cardiomyopathies: Secondary | ICD-10-CM | POA: Diagnosis present

## 2018-08-03 DIAGNOSIS — K219 Gastro-esophageal reflux disease without esophagitis: Secondary | ICD-10-CM | POA: Diagnosis present

## 2018-08-03 DIAGNOSIS — M797 Fibromyalgia: Secondary | ICD-10-CM | POA: Diagnosis present

## 2018-08-03 DIAGNOSIS — I5042 Chronic combined systolic (congestive) and diastolic (congestive) heart failure: Secondary | ICD-10-CM | POA: Diagnosis present

## 2018-08-03 DIAGNOSIS — I471 Supraventricular tachycardia: Secondary | ICD-10-CM | POA: Diagnosis present

## 2018-08-03 DIAGNOSIS — N179 Acute kidney failure, unspecified: Secondary | ICD-10-CM | POA: Diagnosis present

## 2018-08-03 DIAGNOSIS — Z794 Long term (current) use of insulin: Secondary | ICD-10-CM | POA: Diagnosis not present

## 2018-08-03 DIAGNOSIS — G8929 Other chronic pain: Secondary | ICD-10-CM | POA: Diagnosis present

## 2018-08-03 DIAGNOSIS — E785 Hyperlipidemia, unspecified: Secondary | ICD-10-CM | POA: Diagnosis present

## 2018-08-03 DIAGNOSIS — Z7951 Long term (current) use of inhaled steroids: Secondary | ICD-10-CM | POA: Diagnosis not present

## 2018-08-03 DIAGNOSIS — J45909 Unspecified asthma, uncomplicated: Secondary | ICD-10-CM | POA: Diagnosis present

## 2018-08-03 DIAGNOSIS — E1140 Type 2 diabetes mellitus with diabetic neuropathy, unspecified: Secondary | ICD-10-CM | POA: Diagnosis present

## 2018-08-03 DIAGNOSIS — E875 Hyperkalemia: Secondary | ICD-10-CM | POA: Diagnosis present

## 2018-08-03 DIAGNOSIS — Z9884 Bariatric surgery status: Secondary | ICD-10-CM | POA: Diagnosis not present

## 2018-08-03 LAB — CORTISOL: Cortisol, Plasma: 8.7 ug/dL

## 2018-08-03 LAB — GLUCOSE, CAPILLARY
Glucose-Capillary: 180 mg/dL — ABNORMAL HIGH (ref 70–99)
Glucose-Capillary: 138 mg/dL — ABNORMAL HIGH (ref 70–99)
Glucose-Capillary: 245 mg/dL — ABNORMAL HIGH (ref 70–99)
Glucose-Capillary: 211 mg/dL — ABNORMAL HIGH (ref 70–99)

## 2018-08-03 NOTE — Progress Notes (Signed)
Progress Note  Patient Name: [name] Date of Encounter: 08/03/2018  Primary Cardiologist: Shirlee Latch  Subjective   Pt feels nauseated    Breahting is OK   Has frontal and maxillary (L ) HA   Got warm on way from bathroom   Felt like could have passed out    Inpatient Medications    Scheduled Meds: . buPROPion  300 mg Oral QAC breakfast  . diclofenac sodium  2 g Topical BID  . DULoxetine  60 mg Oral Daily  . famotidine  20 mg Oral Daily  . heparin  5,000 Units Subcutaneous Q8H  . insulin aspart  0-5 Units Subcutaneous QHS  . insulin aspart  0-9 Units Subcutaneous TID WC  . insulin glargine  20 Units Subcutaneous Daily  . lubiprostone  24 mcg Oral BID WC  . pregabalin  150 mg Oral TID  . rosuvastatin  5 mg Oral Daily  . sodium chloride flush  3 mL Intravenous Once   Continuous Infusions:  PRN Meds: acetaminophen **OR** acetaminophen, albuterol, ibuprofen, lidocaine, ondansetron **OR** ondansetron (ZOFRAN) IV   Vital Signs    Vitals:   08/03/18 0016 08/03/18 0248 08/03/18 0527 08/03/18 0747  BP: 108/70  112/73 132/82  Pulse: 76  75 75  Resp: 18  18 17   Temp: 98 F (36.7 C)  98.5 F (36.9 C) 97.8 F (36.6 C)  TempSrc: Oral  Oral Oral  SpO2: 98%  100% 99%  Weight:  87.8 kg    Height:        Intake/Output Summary (Last 24 hours) at 08/03/2018 0911 Last data filed at 08/03/2018 0249 Gross per 24 hour  Intake 1465.79 ml  Output 400 ml  Net 1065.79 ml   Last 3 Weights 08/03/2018 08/02/2018 08/01/2018  Weight (lbs) 193 lb 8 oz 194 lb 8 oz 189 lb 14.4 oz  Weight (kg) 87.771 kg 88.225 kg 86.138 kg  Some encounter information is confidential and restricted. Go to Review Flowsheets activity to see all data.      Telemetry    SR   No arrhythmias - Personally Reviewed  ECG    - Personally Reviewed  Physical Exam   GEN: No acute distress.   Neck: No JVD Cardiac: RRR, no murmurs, rubs, or gallops.  Respiratory: Clear to auscultation bilaterally. GI: Soft,  nontender, non-distended  MS: No edema; No deformity. Neuro:  Nonfocal  Psych: Normal affect   Labs    Chemistry Recent Labs  Lab 08/01/18 1140 08/01/18 1831 08/02/18 0559  NA 135 135 137  K 5.9* 5.0 4.2  CL 108 109 108  CO2 17* 20* 20*  GLUCOSE 252* 311* 238*  BUN 42* 39* 46*  CREATININE 1.44* 1.27* 1.19*  CALCIUM 9.0 8.9 8.9  PROT 7.5  --  6.8  ALBUMIN 4.0  --  3.5  AST 62*  --  39  ALT 63*  --  50*  ALKPHOS 139*  --  119  BILITOT 0.4  --  0.2*  GFRNONAA 43* 50* 54*  GFRAA 50* 58* >60  ANIONGAP 10 6 9      Hematology Recent Labs  Lab 08/01/18 1140 08/02/18 0559  WBC 11.6* 8.8  RBC 4.15 3.91  HGB 12.2 11.7*  HCT 39.4 37.9  MCV 94.9 96.9  MCH 29.4 29.9  MCHC 31.0 30.9  RDW 12.5 12.5  PLT 218 221    Cardiac EnzymesNo results for input(s): TROPONINI in the last 168 hours. No results for input(s): TROPIPOC in the last  168 hours.   BNPNo results for input(s): BNP, PROBNP in the last 168 hours.   DDimer No results for input(s): DDIMER in the last 168 hours.   Radiology    Dg Chest 2 View  Result Date: 08/01/2018 CLINICAL DATA:  Shortness of breath and syncope this morning. EXAM: CHEST - 2 VIEW COMPARISON:  PA and lateral chest 02/01/2017. FINDINGS: The lungs are clear. Heart size is normal. No pneumothorax or pleural fluid. Spinal stimulator is in place. No acute or focal bony abnormality. IMPRESSION: Negative chest. Electronically Signed   By: Drusilla Kannerhomas  Dalessio M.D.   On: 08/01/2018 12:03   Ct Head Wo Contrast  Result Date: 08/01/2018 CLINICAL DATA:  Altered level of consciousness. EXAM: CT HEAD WITHOUT CONTRAST TECHNIQUE: Contiguous axial images were obtained from the base of the skull through the vertex without intravenous contrast. COMPARISON:  CT scan of June 04, 2013. FINDINGS: Brain: Right occipital encephalomalacia is noted consistent with old infarction. No mass effect or midline shift is noted. Ventricular size is within normal limits. There is no  evidence of mass lesion, hemorrhage or acute infarction. Vascular: No hyperdense vessel or unexpected calcification. Skull: Normal. Negative for fracture or focal lesion. Sinuses/Orbits: No acute finding. Other: None. 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 No acute intracranial abnormality seen Electronically Signed   By: Lupita RaiderJames  Green Jr, M.D.   On: 08/01/2018 12:45   [place] Abdomen Limited Ruq  Result Date: 08/01/2018 CLINICAL DATA:  Elevated LFTs EXAM: ULTRASOUND ABDOMEN LIMITED RIGHT UPPER QUADRANT COMPARISON:  None. FINDINGS: Gallbladder: Surgically absent Common bile duct: Diameter: 3.2 mm Liver: No focal lesion identified. Within normal limits in parenchymal echogenicity. Portal vein is patent on color Doppler imaging with normal direction of blood flow towards the liver. IMPRESSION: Previous cholecystectomy.  No other abnormalities. Electronically Signed   By: Gerome Samavid  Williams III M.D   On: 08/01/2018 16:15    Cardiac Studies    1. The left ventricle has low normal systolic function, with an ejection fraction of 50-55%. The cavity size was normal. Left ventricular diastolic parameters were normal.  2. The right ventricle has normal systolic function. The cavity was normal. There is no increase in right ventricular wall thickness.  3. The mitral valve is normal in structure.  4. The tricuspid valve is normal in structure.  5. The aortic valve is tricuspid.  6. The inferior vena cava was normal in size with <50% respiratory variability.  FINDINGS  Left Ventricle: The left ventricle has low normal systolic function, with an ejection fraction of 50-55%. The cavity size was normal. There is no increase in left ventricular wall thickness. Left ventricular diastolic parameters were normal Right Ventricle: The right ventricle has normal systolic function. The cavity was normal. There is no increase in right ventricular wall thickness.     Left Atrium: left atrial size was normal in  size Right Atrium: right atrial size was normal in size. Right atrial pressure is estimated at 8 mmHg.   Interatrial Septum: No atrial level shunt detected by color flow Doppler. Pericardium: There is no evidence of pericardial effusion. Mitral Valve: The mitral valve is normal in structure. Mitral valve regurgitation is not visualized by color flow Doppler. Tricuspid Valve: The tricuspid valve is normal in structure. Tricuspid valve regurgitation was not visualized by color flow Doppler. Aortic Valve: The aortic valve is tricuspid Aortic valve regurgitation was not visualized by color flow Doppler. Pulmonic Valve: The pulmonic valve was grossly normal. Pulmonic valve regurgitation is not visualized by  color flow Doppler. Pulmonary Artery: The pulmonary artery is not well seen. Venous: The inferior vena cava is normal in size with less than 50% respiratory variability.   LEFT VENTRICLE PLAX 2D (Teich) LV EF:          55.8 %   Diastology LVIDd:          4.94 cm  LV e' lateral:   10.30 cm/s LVIDs:          3.50 cm  LV E/e' lateral: 6.8 LV PW:          0.94 cm  LV e' medial:    9.68 cm/s LV IVS:         1.07 cm  LV E/e' medial:  7.2 LVOT diam:      2.10 cm LV SV:          64 ml LVOT Area:      3.46 cm  RIGHT VENTRICLE RV Basal diam:  3.57 cm RV Mid diam:    3.43 cm RV S prime:     13.10 cm/s TAPSE (M-mode): 1.7 cm  LEFT ATRIUM             Index       RIGHT ATRIUM           Index LA diam:        4.30 cm 2.22 cm/m  RA Pressure: 8 mmHg LA Vol (A2C):   42.1 ml 21.76 ml/m RA Area:     16.80 cm LA Vol (A4C):   40.2 ml 20.77 ml/m RA Volume:   42.70 ml  22.07 ml/m LA Biplane Vol: 43.2 ml 22.33 ml/m  AORTIC VALVE LVOT Vmax:   81.30 cm/s LVOT Vmean:  56.100 cm/s LVOT VTI:    0.150 m   AORTA Ao Root diam: 2.80 cm  MITRAL VALVE MV Area (PHT): 4.15 cm MV PHT:        53.07 msec MV Decel Time: 183 msec MV E velocity: 69.80 cm/s MV A velocity: 68.40 cm/s MV E/A ratio:   1.02    Jodelle Red MD Electronically signed by Jodelle Red MD Signature Date/Time: 08/02/2018/5:32:57 PM       Final       Patient Profile     48 y.o. female . female with a hx of NICM w/ EF 20-25% (2015)>>55-60% (2016)>>45-50% (2018), sister s/p heart transplant, cath ok 2015, anemia, DM, HTN, HLD, depression, fibromyalgia, asthma, bipolar d/o, OSA not on CPAP, obesity s/p gastric bypass, who is being seen today for the evaluation of syncope at the request of Dr Blake Divine.  Assessment & Plan    1   Syncope   Spell this am of "warm" sensation   No syncope  Pt does not recall recent viral infection or illness predating current symtpoms    She dos say that she was taking ALL her medicines prior to admit (coreg, aldactone, entresto, torsemide 80 and amlodipone)  BP now off all these meds is in 110s UA SG yesterday AM was 1.01    No arrhythmias on monitor  Will check orthostatics  Continue telemetry    Hold adding any meds for now    2   NICM   Echo yesterday as noted above  LVEF has normalized  3  HTN  BP is OK OFF OF ALL MEDS  4  HL  COntinue statin  5  DM   GLu has not  Been bad   Will check A1C   ? If this  relates to 1  6    Hx OSA   Should be on CPAP      For questions or updates, please contact [facility] Please consult [email or web address] for contact info under        Signed, Dietrich Pates, MD  08/03/2018, 9:11 AM

## 2018-08-04 LAB — GLUCOSE, CAPILLARY
Glucose-Capillary: 187 mg/dL — ABNORMAL HIGH (ref 70–99)
Glucose-Capillary: 150 mg/dL — ABNORMAL HIGH (ref 70–99)
Glucose-Capillary: 179 mg/dL — ABNORMAL HIGH (ref 70–99)
Glucose-Capillary: 137 mg/dL — ABNORMAL HIGH (ref 70–99)

## 2018-08-04 LAB — HEMOGLOBIN A1C
HEMOGLOBIN A1C: 9.7 % — AB (ref 4.8–5.6)
Mean Plasma Glucose: 232 mg/dL

## 2018-08-04 MED ORDER — INSULIN GLARGINE 100 UNIT/ML ~~LOC~~ SOLN
26.0000 [IU] | Freq: Every day | SUBCUTANEOUS | Status: DC
  Administered 2018-08-05: 26 [IU] via SUBCUTANEOUS
  Filled 2018-08-04: qty 0.26

## 2018-08-04 MED ORDER — METOPROLOL SUCCINATE ER 50 MG PO TB24
50.0000 mg | ORAL_TABLET | Freq: Two times a day (BID) | ORAL | Status: DC
  Administered 2018-08-04: 50 mg via ORAL
  Filled 2018-08-04: qty 1

## 2018-08-04 NOTE — Progress Notes (Signed)
Inpatient Diabetes Program Recommendations  AACE/ADA: New Consensus Statement on Inpatient Glycemic Control (2015)  Target Ranges:  Prepandial:   less than 140 mg/dL      Peak postprandial:   less than 180 mg/dL (1-2 hours)      Critically ill patients:  140 - 180 mg/dL   Lab Results  Component Value Date   GLUCAP 187 (H) 08/04/2018   HGBA1C 9.7 (H) 08/03/2018    Review of Glycemic Control Results for [name], [name] (MRN [id number]) as of 08/04/2018 10:51  Ref. Range 08/03/2018 07:45 08/03/2018 12:53 08/03/2018 17:54 08/03/2018 20:40 08/04/2018 07:48  Glucose-Capillary Latest Ref Range: 70 - 99 mg/dL 465 (H) 681 (H) 275 (H) 211 (H) 187 (H)   Diabetes history: DM 2 Outpatient Diabetes medications:  Lantus 36 units daily, Victoza 1.8 mg daily Current orders for Inpatient glycemic control:  Novolog sensitive tid with meals and HS Lantus 20 units daily Inpatient Diabetes Program Recommendations:   Please consider increasing Lantus to 26 units daily.    Thanks,  Beryl Meager, RN, BC-ADM Inpatient Diabetes Coordinator Pager [phone] (8a-5p)

## 2018-08-04 NOTE — Progress Notes (Addendum)
Progress Note  Patient Name: [name] Date of Encounter: 08/04/2018  Primary Cardiologist: Shirlee Latch  Subjective   No Cp   A little dizzy   Not "warm'  Inpatient Medications    Scheduled Meds: . buPROPion  300 mg Oral QAC breakfast  . diclofenac sodium  2 g Topical BID  . DULoxetine  60 mg Oral Daily  . famotidine  20 mg Oral Daily  . heparin  5,000 Units Subcutaneous Q8H  . insulin aspart  0-5 Units Subcutaneous QHS  . insulin aspart  0-9 Units Subcutaneous TID WC  . insulin glargine  20 Units Subcutaneous Daily  . lubiprostone  24 mcg Oral BID WC  . pregabalin  150 mg Oral TID  . rosuvastatin  5 mg Oral Daily  . sodium chloride flush  3 mL Intravenous Once   Continuous Infusions:  PRN Meds: acetaminophen **OR** acetaminophen, albuterol, ibuprofen, lidocaine, ondansetron **OR** ondansetron (ZOFRAN) IV   Vital Signs    Vitals:   08/04/18 0608 08/04/18 0609 08/04/18 0840 08/04/18 1214  BP: 128/78  (!) 136/91 (!) 149/90  Pulse: 75  77 78  Resp: 18   18  Temp: 98 F (36.7 C) 97.9 F (36.6 C)  98 F (36.7 C)  TempSrc: Oral Oral  Oral  SpO2: 100%   100%  Weight:      Height:        Intake/Output Summary (Last 24 hours) at 08/04/2018 1242 Last data filed at 08/04/2018 1222 Gross per 24 hour  Intake 1200 ml  Output 2550 ml  Net -1350 ml   Last 3 Weights 08/04/2018 08/03/2018 08/02/2018  Weight (lbs) 195 lb 4.8 oz 193 lb 8 oz 194 lb 8 oz  Weight (kg) 88.587 kg 87.771 kg 88.225 kg  Some encounter information is confidential and restricted. Go to Review Flowsheets activity to see all data.      Telemetry    SR   No arrhythmias - Personally Reviewed  ECG      Physical Exam   GEN: No acute distress.   Neck: JVP is normal  Cardiac: RRR, no murmurs, rubs, or gallops.  Respiratory: Clear to auscultation bilaterally. GI: Soft, nontender, non-distended  MS: No edema; No deformity. Neuro:  Nonfocal  Psych: Normal affect   Labs    Chemistry Recent  Labs  Lab 08/01/18 1140 08/01/18 1831 08/02/18 0559  NA 135 135 137  K 5.9* 5.0 4.2  CL 108 109 108  CO2 17* 20* 20*  GLUCOSE 252* 311* 238*  BUN 42* 39* 46*  CREATININE 1.44* 1.27* 1.19*  CALCIUM 9.0 8.9 8.9  PROT 7.5  --  6.8  ALBUMIN 4.0  --  3.5  AST 62*  --  39  ALT 63*  --  50*  ALKPHOS 139*  --  119  BILITOT 0.4  --  0.2*  GFRNONAA 43* 50* 54*  GFRAA 50* 58* >60  ANIONGAP 10 6 9      Hematology Recent Labs  Lab 08/01/18 1140 08/02/18 0559  WBC 11.6* 8.8  RBC 4.15 3.91  HGB 12.2 11.7*  HCT 39.4 37.9  MCV 94.9 96.9  MCH 29.4 29.9  MCHC 31.0 30.9  RDW 12.5 12.5  PLT 218 221    Cardiac EnzymesNo results for input(s): TROPONINI in the last 168 hours. No results for input(s): TROPIPOC in the last 168 hours.   BNPNo results for input(s): BNP, PROBNP in the last 168 hours.   DDimer No results for input(s): DDIMER in  the last 168 hours.   Radiology    No results found.  Cardiac Studies    1. The left ventricle has low normal systolic function, with an ejection fraction of 50-55%. The cavity size was normal. Left ventricular diastolic parameters were normal.  2. The right ventricle has normal systolic function. The cavity was normal. There is no increase in right ventricular wall thickness.  3. The mitral valve is normal in structure.  4. The tricuspid valve is normal in structure.  5. The aortic valve is tricuspid.  6. The inferior vena cava was normal in size with <50% respiratory variability.  FINDINGS  Left Ventricle: The left ventricle has low normal systolic function, with an ejection fraction of 50-55%. The cavity size was normal. There is no increase in left ventricular wall thickness. Left ventricular diastolic parameters were normal Right Ventricle: The right ventricle has normal systolic function. The cavity was normal. There is no increase in right ventricular wall thickness.     Left Atrium: left atrial size was normal in size Right  Atrium: right atrial size was normal in size. Right atrial pressure is estimated at 8 mmHg.   Interatrial Septum: No atrial level shunt detected by color flow Doppler. Pericardium: There is no evidence of pericardial effusion. Mitral Valve: The mitral valve is normal in structure. Mitral valve regurgitation is not visualized by color flow Doppler. Tricuspid Valve: The tricuspid valve is normal in structure. Tricuspid valve regurgitation was not visualized by color flow Doppler. Aortic Valve: The aortic valve is tricuspid Aortic valve regurgitation was not visualized by color flow Doppler. Pulmonic Valve: The pulmonic valve was grossly normal. Pulmonic valve regurgitation is not visualized by color flow Doppler. Pulmonary Artery: The pulmonary artery is not well seen. Venous: The inferior vena cava is normal in size with less than 50% respiratory variability.   LEFT VENTRICLE PLAX 2D (Teich) LV EF:          55.8 %   Diastology LVIDd:          4.94 cm  LV e' lateral:   10.30 cm/s LVIDs:          3.50 cm  LV E/e' lateral: 6.8 LV PW:          0.94 cm  LV e' medial:    9.68 cm/s LV IVS:         1.07 cm  LV E/e' medial:  7.2 LVOT diam:      2.10 cm LV SV:          64 ml LVOT Area:      3.46 cm  RIGHT VENTRICLE RV Basal diam:  3.57 cm RV Mid diam:    3.43 cm RV S prime:     13.10 cm/s TAPSE (M-mode): 1.7 cm  LEFT ATRIUM             Index       RIGHT ATRIUM           Index LA diam:        4.30 cm 2.22 cm/m  RA Pressure: 8 mmHg LA Vol (A2C):   42.1 ml 21.76 ml/m RA Area:     16.80 cm LA Vol (A4C):   40.2 ml 20.77 ml/m RA Volume:   42.70 ml  22.07 ml/m LA Biplane Vol: 43.2 ml 22.33 ml/m  AORTIC VALVE LVOT Vmax:   81.30 cm/s LVOT Vmean:  56.100 cm/s LVOT VTI:    0.150 m   AORTA Ao Root diam: 2.80  cm  MITRAL VALVE MV Area (PHT): 4.15 cm MV PHT:        53.07 msec MV Decel Time: 183 msec MV E velocity: 69.80 cm/s MV A velocity: 68.40 cm/s MV E/A ratio:  1.02      Jodelle Red MD Electronically signed by Jodelle Red MD Signature Date/Time: 08/02/2018/5:32:57 PM      Final       Patient Profile     48 y.o. female . female with a hx of NICM w/ EF 20-25% (2015)>>55-60% (2016)>>45-50% (2018), sister s/p heart transplant, cath ok 2015, anemia, DM, HTN, HLD, depression, fibromyalgia, asthma, bipolar d/o, OSA not on CPAP, obesity s/p gastric bypass, who is being seen today for the evaluation of syncope at the request of Dr Blake Divine.  Assessment & Plan    1   Syncope    Pt feeling better now that meds have been held On review she says that her symptoms of feelig "warm" dizzy often occurred about 1 hour after taking meds    Follow   Orthostatics negatvie   Ambulate  2  Rhythm  Remains in SR   Will Add low dose toprol XL 25  Given atrial tach  3  NICM   Volume status is OK   Echo shows normalized LVEF   Again, add low dose b blocker    4  HL  COntinue statin  5  DM   Hgb A1 C is 9.7   This is not helping if dehydrates      Will increase lantus to 26 U per day here  I have  Reviewed with Diabetic coordinator    Resume home dose at d/c with f/u wth primary quickly    6    Hx OSA   Should be on CPAP    Ambulate today    For questions or updates, please contact [facility] Please consult [email or web address] for contact info under        Signed, Dietrich Pates, MD  08/04/2018, 12:42 PM

## 2018-08-05 LAB — GLUCOSE, CAPILLARY
Glucose-Capillary: 155 mg/dL — ABNORMAL HIGH (ref 70–99)
Glucose-Capillary: 111 mg/dL — ABNORMAL HIGH (ref 70–99)

## 2018-08-05 MED ORDER — METOPROLOL SUCCINATE ER 50 MG PO TB24
50.0000 mg | ORAL_TABLET | Freq: Two times a day (BID) | ORAL | Status: DC
  Administered 2018-08-05: 50 mg via ORAL
  Filled 2018-08-05: qty 1

## 2018-08-05 MED ORDER — METOPROLOL SUCCINATE ER 50 MG PO TB24: 50.0000 mg | ORAL_TABLET | Freq: Two times a day (BID) | ORAL | 5 refills | Status: DC
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
## 2018-08-05 NOTE — Discharge Instructions (Signed)
No Driving for now.  Medication adjustments have been made and you this will be reviewed at follow up. Please keep blood pressure log and bring cuff and log with you to follow up appointment.      Syncope Syncope is when you pass out (faint) for a short time. It is caused by a sudden decrease in blood flow to the brain. Signs that you may be about to pass out include:  Feeling dizzy or light-headed.  Feeling sick to your stomach (nauseous).  Seeing all white or all black.  Having cold, clammy skin. If you pass out, get help right away. Call your local emergency services (911 in the [place] Do not drive yourself to the hospital. Follow these instructions at home: Watch for any changes in your symptoms. Take these actions to stay safe and help with your symptoms: Lifestyle  Do not drive, use machinery, or play sports until your doctor says it is okay.  Do not drink alcohol.  Do not use any products that contain nicotine or tobacco, such as cigarettes and e-cigarettes. If you need help quitting, ask your doctor.  Drink enough fluid to keep your pee (urine) pale yellow. General instructions  Take over-the-counter and prescription medicines only as told by your doctor.  If you are taking blood pressure or heart medicine, sit up and stand up slowly. Spend a few minutes getting ready to sit and then stand. This can help you feel less dizzy.  Have someone stay with you until you feel stable.  If you start to feel like you might pass out, lie down right away and raise (elevate) your feet above the level of your heart. Breathe deeply and steadily. Wait until all of the symptoms are gone.  Keep all follow-up visits as told by your doctor. This is important. Get help right away if:  You have a very bad headache.  You pass out once or more than once.  You have pain in your chest, belly, or back.  You have a very fast or uneven heartbeat (palpitations).  It hurts to breathe.  You  are bleeding from your mouth or your bottom (rectum).  You have black or tarry poop (stool).  You have jerky movements that you cannot control (seizure).  You are confused.  You have trouble walking.  You are very weak.  You have vision problems. These symptoms may be an emergency. Do not wait to see if the symptoms will go away. Get medical help right away. Call your local emergency services (911 in the [place] Do not drive yourself to the hospital. Summary  Syncope is when you pass out (faint) for a short time. It is caused by a sudden decrease in blood flow to the brain.  Signs that you may be about to faint include feeling dizzy, light-headed, or sick to your stomach, seeing all white or all black, or having cold, clammy skin.  If you start to feel like you might pass out, lie down right away and raise (elevate) your feet above the level of your heart. Breathe deeply and steadily. Wait until all of the symptoms are gone. This information is not intended to replace advice given to you by your health care provider. Make sure you discuss any questions you have with your health care provider. Document Released: 11/18/2007 Document Revised: 07/14/2017 Document Reviewed: 07/14/2017 Elsevier Interactive Patient Education  2019 [employer].

## 2018-08-05 NOTE — Progress Notes (Signed)
Progress Note  Patient Name: [name] Date of Encounter: 08/05/2018  Primary Cardiologist: Shirlee Latch  Subjective  No dizzy   No SOB   NO CP  Inpatient Medications    Scheduled Meds: . buPROPion  300 mg Oral QAC breakfast  . diclofenac sodium  2 g Topical BID  . DULoxetine  60 mg Oral Daily  . famotidine  20 mg Oral Daily  . heparin  5,000 Units Subcutaneous Q8H  . insulin aspart  0-5 Units Subcutaneous QHS  . insulin aspart  0-9 Units Subcutaneous TID WC  . insulin glargine  26 Units Subcutaneous Daily  . lubiprostone  24 mcg Oral BID WC  . metoprolol succinate  50 mg Oral BID WC  . pregabalin  150 mg Oral TID  . rosuvastatin  5 mg Oral Daily  . sodium chloride flush  3 mL Intravenous Once   Continuous Infusions:  PRN Meds: acetaminophen **OR** acetaminophen, albuterol, ibuprofen, lidocaine, ondansetron **OR** ondansetron (ZOFRAN) IV   Vital Signs    Vitals:   08/05/18 0428 08/05/18 0432 08/05/18 0433 08/05/18 0440  BP: 116/79 111/86 104/73 120/81  Pulse: 65 68 70 70  Resp: 18     Temp: 98.3 F (36.8 C)     TempSrc: Oral     SpO2: 100% 100%  100%  Weight: 89.5 kg     Height:        Intake/Output Summary (Last 24 hours) at 08/05/2018 0753 Last data filed at 08/05/2018 0425 Gross per 24 hour  Intake 360 ml  Output 1700 ml  Net -1340 ml   Last 3 Weights 08/05/2018 08/04/2018 08/03/2018  Weight (lbs) 197 lb 4.8 oz 195 lb 4.8 oz 193 lb 8 oz  Weight (kg) 89.495 kg 88.587 kg 87.771 kg  Some encounter information is confidential and restricted. Go to Review Flowsheets activity to see all data.      Telemetry    SR   No arrhythmias - Personally Reviewed  ECG      Physical Exam   GEN: No acute distress.   Neck: JVP is not elevated   Cardiac: RRR, no murmurs, rubs, or gallops.  Respiratory: Clear to auscultation bilaterally. GI: Soft, nontender, non-distended  MS: No edema; No deformity. Neuro:  Nonfocal  Psych: Normal affect   Labs      Chemistry Recent Labs  Lab 08/01/18 1140 08/01/18 1831 08/02/18 0559  NA 135 135 137  K 5.9* 5.0 4.2  CL 108 109 108  CO2 17* 20* 20*  GLUCOSE 252* 311* 238*  BUN 42* 39* 46*  CREATININE 1.44* 1.27* 1.19*  CALCIUM 9.0 8.9 8.9  PROT 7.5  --  6.8  ALBUMIN 4.0  --  3.5  AST 62*  --  39  ALT 63*  --  50*  ALKPHOS 139*  --  119  BILITOT 0.4  --  0.2*  GFRNONAA 43* 50* 54*  GFRAA 50* 58* >60  ANIONGAP 10 6 9      Hematology Recent Labs  Lab 08/01/18 1140 08/02/18 0559  WBC 11.6* 8.8  RBC 4.15 3.91  HGB 12.2 11.7*  HCT 39.4 37.9  MCV 94.9 96.9  MCH 29.4 29.9  MCHC 31.0 30.9  RDW 12.5 12.5  PLT 218 221    Cardiac EnzymesNo results for input(s): TROPONINI in the last 168 hours. No results for input(s): TROPIPOC in the last 168 hours.   BNPNo results for input(s): BNP, PROBNP in the last 168 hours.   DDimer No results  for input(s): DDIMER in the last 168 hours.   Radiology    No results found.  Cardiac Studies    1. The left ventricle has low normal systolic function, with an ejection fraction of 50-55%. The cavity size was normal. Left ventricular diastolic parameters were normal.  2. The right ventricle has normal systolic function. The cavity was normal. There is no increase in right ventricular wall thickness.  3. The mitral valve is normal in structure.  4. The tricuspid valve is normal in structure.  5. The aortic valve is tricuspid.  6. The inferior vena cava was normal in size with <50% respiratory variability.  FINDINGS  Left Ventricle: The left ventricle has low normal systolic function, with an ejection fraction of 50-55%. The cavity size was normal. There is no increase in left ventricular wall thickness. Left ventricular diastolic parameters were normal Right Ventricle: The right ventricle has normal systolic function. The cavity was normal. There is no increase in right ventricular wall thickness.     Left Atrium: left atrial size was normal  in size Right Atrium: right atrial size was normal in size. Right atrial pressure is estimated at 8 mmHg.   Interatrial Septum: No atrial level shunt detected by color flow Doppler. Pericardium: There is no evidence of pericardial effusion. Mitral Valve: The mitral valve is normal in structure. Mitral valve regurgitation is not visualized by color flow Doppler. Tricuspid Valve: The tricuspid valve is normal in structure. Tricuspid valve regurgitation was not visualized by color flow Doppler. Aortic Valve: The aortic valve is tricuspid Aortic valve regurgitation was not visualized by color flow Doppler. Pulmonic Valve: The pulmonic valve was grossly normal. Pulmonic valve regurgitation is not visualized by color flow Doppler. Pulmonary Artery: The pulmonary artery is not well seen. Venous: The inferior vena cava is normal in size with less than 50% respiratory variability.   LEFT VENTRICLE PLAX 2D (Teich) LV EF:          55.8 %   Diastology LVIDd:          4.94 cm  LV e' lateral:   10.30 cm/s LVIDs:          3.50 cm  LV E/e' lateral: 6.8 LV PW:          0.94 cm  LV e' medial:    9.68 cm/s LV IVS:         1.07 cm  LV E/e' medial:  7.2 LVOT diam:      2.10 cm LV SV:          64 ml LVOT Area:      3.46 cm  RIGHT VENTRICLE RV Basal diam:  3.57 cm RV Mid diam:    3.43 cm RV S prime:     13.10 cm/s TAPSE (M-mode): 1.7 cm  LEFT ATRIUM             Index       RIGHT ATRIUM           Index LA diam:        4.30 cm 2.22 cm/m  RA Pressure: 8 mmHg LA Vol (A2C):   42.1 ml 21.76 ml/m RA Area:     16.80 cm LA Vol (A4C):   40.2 ml 20.77 ml/m RA Volume:   42.70 ml  22.07 ml/m LA Biplane Vol: 43.2 ml 22.33 ml/m  AORTIC VALVE LVOT Vmax:   81.30 cm/s LVOT Vmean:  56.100 cm/s LVOT VTI:    0.150 m   AORTA  Ao Root diam: 2.80 cm  MITRAL VALVE MV Area (PHT): 4.15 cm MV PHT:        53.07 msec MV Decel Time: 183 msec MV E velocity: 69.80 cm/s MV A velocity: 68.40 cm/s MV E/A ratio:   1.02    Jodelle Red MD Electronically signed by Jodelle Red MD Signature Date/Time: 08/02/2018/5:32:57 PM      Final       Patient Profile     48 y.o. female . female with a hx of NICM w/ EF 20-25% (2015)>>55-60% (2016)>>45-50% (2018), sister s/p heart transplant, cath ok 2015, anemia, DM, HTN, HLD, depression, fibromyalgia, asthma, bipolar d/o, OSA not on CPAP, obesity s/p gastric bypass, who is being seen today for the evaluation of syncope at the request of Dr Blake Divine.  Assessment & Plan    1   Syncope    Clinically improved   May be from too much medicine   Have stopped  Only on Toprol    WIll follow as outpt with D Shirlee Latch   Told her to keep log of BP and bring cuff to appt  NO Driviing for now  2  Rhythm  Remains in SR   Will Add low dose toprol XL 25 given atrial tach (transient) that she had at admit    3  NICM      Echo shows normalized LVEF   Again, add low dose b blocker    4  HL  COntinue statin  5  DM   Hgb A1 C is 9.7   This is not helping if dehydrates      Will increase lantus to 26 U per day here  I have  Reviewed with Diabetic coordinator    Resume home dose at d/c with f/u wth primary quickly    6    Hx OSA   Should be on CPAP    Home today  F/U with [facility] for first vist  Further Rx based on this eval    For questions or updates, please contact [facility] Please consult [email or web address] for contact info under        Signed, Dietrich Pates, MD  08/05/2018, 7:53 AM

## 2018-08-05 NOTE — Progress Notes (Signed)
CM talked to patient about HHPT, pt refused ; also patient is using rolling walker in the room; CM asked pt is I could order her a walker for home patient refused. [facility] [phone]
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
## 2018-08-09 ENCOUNTER — Inpatient Hospital Stay (HOSPITAL_COMMUNITY): Payer: Medicare HMO

## 2018-08-09 ENCOUNTER — Other Ambulatory Visit: Payer: Self-pay

## 2018-08-09 NOTE — Patient Outreach (Signed)
[facility]) Care Management  08/09/2018  [name] [birth year] [id number]  Discharged from [facility] 08/05/2018.   Referral received. No outreach warranted at this time. Transition of Care  will be completed by primary care provider office who will refer to [facility] care management if needed.  Plan: RN CM will close case.  Bary Leriche, RN, MSN [facility] Care Management Care Management Coordinator Direct Line [phone] Cell [phone] Toll Free: [phone]  Fax: [phone]

## 2018-08-15 DIAGNOSIS — H35372 Puckering of macula, left eye: Secondary | ICD-10-CM | POA: Diagnosis not present

## 2018-08-15 DIAGNOSIS — E113522 Type 2 diabetes mellitus with proliferative diabetic retinopathy with traction retinal detachment involving the macula, left eye: Secondary | ICD-10-CM | POA: Diagnosis not present

## 2018-08-15 DIAGNOSIS — H4312 Vitreous hemorrhage, left eye: Secondary | ICD-10-CM | POA: Diagnosis not present

## 2018-08-15 DIAGNOSIS — E113512 Type 2 diabetes mellitus with proliferative diabetic retinopathy with macular edema, left eye: Secondary | ICD-10-CM | POA: Diagnosis not present

## 2018-08-16 DIAGNOSIS — E113593 Type 2 diabetes mellitus with proliferative diabetic retinopathy without macular edema, bilateral: Secondary | ICD-10-CM | POA: Diagnosis not present

## 2018-08-16 DIAGNOSIS — H4312 Vitreous hemorrhage, left eye: Secondary | ICD-10-CM | POA: Diagnosis not present

## 2018-08-18 ENCOUNTER — Ambulatory Visit (HOSPITAL_BASED_OUTPATIENT_CLINIC_OR_DEPARTMENT_OTHER): Payer: Medicare HMO | Attending: Cardiology | Admitting: Cardiology

## 2018-08-18 VITALS — Ht 65.0 in | Wt 187.0 lb

## 2018-08-18 DIAGNOSIS — G4733 Obstructive sleep apnea (adult) (pediatric): Secondary | ICD-10-CM | POA: Diagnosis not present

## 2018-08-21 NOTE — Procedures (Signed)
   Patient Name: [name], Alva Date: 08/18/2018 Gender: Female D.O.B: 1970/09/02 Age (years): 70 Referring Provider: Armanda Magic MD, ABSM Height (inches): 65 Interpreting Physician: Armanda Magic MD, ABSM Weight (lbs): 187 RPSGT: Cherylann Parr BMI: 31 MRN: [id number] Neck Size: 16.50  CLINICAL INFORMATION  The patient is referred for a CPAP titration to treat sleep apnea.  SLEEP STUDY TECHNIQUE  As per the AASM Manual for the Scoring of Sleep and Associated Events v2.3 (April 2016) with a hypopnea requiring 4% desaturations. The channels recorded and monitored were frontal, central and occipital EEG, electrooculogram (EOG), submentalis EMG (chin), nasal and oral airflow, thoracic and abdominal wall motion, anterior tibialis EMG, snore microphone, electrocardiogram, and pulse oximetry. Continuous positive airway pressure (CPAP) was initiated at the beginning of the study and titrated to treat sleep-disordered breathing.  MEDICATIONS  Medications self-administered by patient taken the night of the study : N/A  TECHNICIAN COMMENTS  Comments added by technician: SIMPLUS FULL FACE MASK WAS USED FOR THIS STUDY Comments added by scorer: N/A  RESPIRATORY PARAMETERS  Optimal PAP Pressure (cm):9 AHI at Optimal Pressure (/hr):0.0 Overall Minimal O2 (%):92.0 Supine % at Optimal Pressure (%):100 Minimal O2 at Optimal Pressure (%):92.0   SLEEP ARCHITECTURE  The study was initiated at 11:11:15 PM and ended at 5:11:28 AM. Sleep onset time was 15.6 minutes and the sleep efficiency was 95.4%. The total sleep time was 343.6 minutes. The patient spent 1.2% of the night in stage N1 sleep, 71.8% in stage N2 sleep, 0.0%% in stage N3 and 27.1% in REM.Stage REM latency was 196.0 minutes Wake after sleep onset was 1.0. Alpha intrusion was absent. Supine sleep was 55.62%.  CARDIAC DATA  The 2 lead EKG demonstrated sinus rhythm. The mean heart rate was 73.7 beats per minute. Other EKG findings  include: PVCs.  LEG MOVEMENT DATA  The total Periodic Limb Movements of Sleep (PLMS) were 0. The PLMS index was 0.0. A PLMS index of <15 is considered normal in adults.  IMPRESSIONS  - The optimal PAP pressure was 9 cm of water. - Central sleep apnea was not noted during this titration (CAI = 0.0/h). - Significant oxygen desaturations were not observed during this titration (min O2 = 92.0%). - No snoring was audible during this study. - 2-lead EKG demonstrated: PVCs - Clinically significant periodic limb movements were not noted during this study. Arousals associated with PLMs were rare.  DIAGNOSIS  - Obstructive Sleep Apnea (327.23 [G47.33 ICD-10])  RECOMMENDATIONS  - Trial of CPAP therapy on 9 cm H2O with a Medium size Fisher&Paykel Full Face Mask Simplus mask and heated humidification. - Avoid alcohol, sedatives and other CNS depressants that may worsen sleep apnea and disrupt normal sleep architecture. - Sleep hygiene should be reviewed to assess factors that may improve sleep quality. - Weight management and regular exercise should be initiated or continued. - Return to Sleep Center for re-evaluation after 10 weeks of therapy  [Electronically signed] 08/21/2018 08:18 PM  Armanda Magic MD, ABSM Diplomate, American Board of Sleep Medicine

## 2018-08-22 ENCOUNTER — Telehealth: Payer: Self-pay | Admitting: *Deleted

## 2018-08-22 NOTE — Telephone Encounter (Signed)
-----   Message from Quintella Reichert, MD sent at 08/21/2018  8:21 PM EDT ----- Please let patient know that they had a successful PAP titration and let DME know that orders are in EPIC.  Please set up 10 week OV with me.

## 2018-08-22 NOTE — Telephone Encounter (Addendum)
Informed patient of sleep study results and patient understanding was verbalized. Patient understands her sleep study showed she  had a successful PAP titration and let DME know that orders are in EPIC. Please set up 10 week OV with me.     Upon patient request DME selection is [facility].. Patient understands she will be contacted by [facility] to set up her cpap. Patient understands to call if Christoper Allegra does not contact her with new setup in a timely manner. Patient understands they will be called once confirmation has been received from [place] that they have received their new machine to schedule 10 week follow up appointment.  [facility] notified of new cpap order  Please add to airview Patient was grateful for the call and thanked me.

## 2018-08-23 DIAGNOSIS — H4312 Vitreous hemorrhage, left eye: Secondary | ICD-10-CM | POA: Diagnosis not present

## 2018-08-23 DIAGNOSIS — E113593 Type 2 diabetes mellitus with proliferative diabetic retinopathy without macular edema, bilateral: Secondary | ICD-10-CM | POA: Diagnosis not present

## 2018-08-24 DIAGNOSIS — D649 Anemia, unspecified: Secondary | ICD-10-CM | POA: Diagnosis not present

## 2018-08-24 DIAGNOSIS — E559 Vitamin D deficiency, unspecified: Secondary | ICD-10-CM | POA: Diagnosis not present

## 2018-08-24 DIAGNOSIS — J4520 Mild intermittent asthma, uncomplicated: Secondary | ICD-10-CM | POA: Diagnosis not present

## 2018-08-24 DIAGNOSIS — E1140 Type 2 diabetes mellitus with diabetic neuropathy, unspecified: Secondary | ICD-10-CM | POA: Diagnosis not present

## 2018-08-24 DIAGNOSIS — E669 Obesity, unspecified: Secondary | ICD-10-CM | POA: Diagnosis not present

## 2018-08-24 DIAGNOSIS — I428 Other cardiomyopathies: Secondary | ICD-10-CM | POA: Diagnosis not present

## 2018-08-24 DIAGNOSIS — F319 Bipolar disorder, unspecified: Secondary | ICD-10-CM | POA: Diagnosis not present

## 2018-08-24 DIAGNOSIS — E11319 Type 2 diabetes mellitus with unspecified diabetic retinopathy without macular edema: Secondary | ICD-10-CM | POA: Diagnosis not present

## 2018-09-09 ENCOUNTER — Other Ambulatory Visit: Payer: Self-pay | Admitting: Cardiology

## 2018-09-13 ENCOUNTER — Telehealth (HOSPITAL_COMMUNITY): Payer: Self-pay

## 2018-09-13 ENCOUNTER — Ambulatory Visit (HOSPITAL_COMMUNITY)
Admission: RE | Admit: 2018-09-13 | Discharge: 2018-09-13 | Disposition: A | Discharge status: Home / Self Care | Payer: Medicare HMO | Source: Ambulatory Visit | Attending: Cardiology | Admitting: Cardiology

## 2018-09-13 ENCOUNTER — Other Ambulatory Visit: Payer: Self-pay

## 2018-09-13 DIAGNOSIS — I5022 Chronic systolic (congestive) heart failure: Secondary | ICD-10-CM | POA: Diagnosis not present

## 2018-09-13 MED ORDER — ONDANSETRON HCL 4 MG PO TABS: 4.0000 mg | ORAL_TABLET | Freq: Three times a day (TID) | ORAL | 0 refills | Status: DC | PRN

## 2018-09-13 MED ORDER — SACUBITRIL-VALSARTAN 24-26 MG PO TABS: 1.0000 | ORAL_TABLET | Freq: Two times a day (BID) | ORAL | 3 refills | Status: DC

## 2018-09-13 NOTE — Patient Instructions (Signed)
Zofran 4mg  PRN & Entresto 24/26 BID sent to pharm.   CMET & Lipids scheduled 10 April @11am   2 month f/u scheduled 26 June @ 920am DM
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
## 2018-09-13 NOTE — Telephone Encounter (Signed)
Spoke with pt regarding orders from televisit with DM, Zofran and Entresto sent to correct pharm per pt, scheduled lab work 10 April @11  (Cmet & lipids) and 2 month f/u OV with DM 26 June @920 

## 2018-09-15 DIAGNOSIS — M5136 Other intervertebral disc degeneration, lumbar region: Secondary | ICD-10-CM | POA: Diagnosis not present

## 2018-09-19 DIAGNOSIS — G4733 Obstructive sleep apnea (adult) (pediatric): Secondary | ICD-10-CM | POA: Diagnosis not present

## 2018-09-22 ENCOUNTER — Ambulatory Visit (HOSPITAL_COMMUNITY)
Admission: RE | Admit: 2018-09-22 | Discharge: 2018-09-22 | Disposition: A | Discharge status: Home / Self Care | Payer: Medicare HMO | Source: Ambulatory Visit | Attending: Internal Medicine | Admitting: Internal Medicine

## 2018-09-22 ENCOUNTER — Other Ambulatory Visit: Payer: Self-pay

## 2018-09-22 DIAGNOSIS — E785 Hyperlipidemia, unspecified: Secondary | ICD-10-CM | POA: Diagnosis not present

## 2018-09-22 DIAGNOSIS — I5022 Chronic systolic (congestive) heart failure: Secondary | ICD-10-CM | POA: Diagnosis not present

## 2018-09-22 DIAGNOSIS — Z23 Encounter for immunization: Secondary | ICD-10-CM | POA: Diagnosis not present

## 2018-09-22 LAB — LIPID PANEL
Cholesterol: 120 mg/dL (ref 0–200)
HDL: 62 mg/dL (ref >40)
LDL Cholesterol: 51 mg/dL (ref 0–99)
Total CHOL/HDL Ratio: 1.9 RATIO
Triglycerides: 37 mg/dL (ref <150)
VLDL: 7 mg/dL (ref 0–40)

## 2018-09-22 LAB — COMPREHENSIVE METABOLIC PANEL
ALT: 47 U/L — ABNORMAL HIGH (ref 0–44)
AST: 27 U/L (ref 15–41)
Albumin: 3.4 g/dL — ABNORMAL LOW (ref 3.5–5.0)
Alkaline Phosphatase: 133 U/L — ABNORMAL HIGH (ref 38–126)
Anion gap: 9 (ref 5–15)
BUN: 11 mg/dL (ref 6–20)
CO2: 24 mmol/L (ref 22–32)
Calcium: 8.8 mg/dL — ABNORMAL LOW (ref 8.9–10.3)
Chloride: 106 mmol/L (ref 98–111)
Creatinine, Ser: 0.76 mg/dL (ref 0.44–1.00)
GFR calc Af Amer: >60 mL/min (ref >60)
GFR calc non Af Amer: >60 mL/min (ref >60)
Glucose, Bld: 175 mg/dL — ABNORMAL HIGH (ref 70–99)
Potassium: 4.1 mmol/L (ref 3.5–5.1)
Sodium: 139 mmol/L (ref 135–145)
Total Bilirubin: 0.6 mg/dL (ref 0.3–1.2)
Total Protein: 6.5 g/dL (ref 6.5–8.1)

## 2018-09-23 ENCOUNTER — Other Ambulatory Visit (HOSPITAL_COMMUNITY): Payer: Medicare HMO

## 2018-09-26 ENCOUNTER — Inpatient Hospital Stay: Payer: Medicare HMO | Attending: Hematology | Admitting: Hematology

## 2018-09-26 ENCOUNTER — Inpatient Hospital Stay: Payer: Medicare HMO

## 2018-09-29 ENCOUNTER — Telehealth: Payer: Self-pay | Admitting: Hematology

## 2018-09-29 NOTE — Telephone Encounter (Signed)
Returning patient's phone call about rescheduling an appointment. Patient rescheduled labs/provider visit for 04/20.  Message to provider.

## 2018-10-01 ENCOUNTER — Emergency Department (HOSPITAL_COMMUNITY)
Admission: EM | Admit: 2018-10-01 | Discharge: 2018-10-01 | Disposition: A | Discharge status: Home / Self Care | Payer: Medicare HMO | Attending: Emergency Medicine | Admitting: Emergency Medicine

## 2018-10-01 ENCOUNTER — Encounter (HOSPITAL_COMMUNITY): Payer: Self-pay | Admitting: Emergency Medicine

## 2018-10-01 DIAGNOSIS — R51 Headache: Secondary | ICD-10-CM | POA: Insufficient documentation

## 2018-10-01 DIAGNOSIS — Z794 Long term (current) use of insulin: Secondary | ICD-10-CM | POA: Insufficient documentation

## 2018-10-01 DIAGNOSIS — R1111 Vomiting without nausea: Secondary | ICD-10-CM | POA: Diagnosis not present

## 2018-10-01 DIAGNOSIS — I10 Essential (primary) hypertension: Secondary | ICD-10-CM | POA: Diagnosis not present

## 2018-10-01 DIAGNOSIS — R519 Headache, unspecified: Secondary | ICD-10-CM

## 2018-10-01 DIAGNOSIS — I5023 Acute on chronic systolic (congestive) heart failure: Secondary | ICD-10-CM | POA: Diagnosis not present

## 2018-10-01 DIAGNOSIS — I110 Hypertensive heart disease with heart failure: Secondary | ICD-10-CM | POA: Diagnosis not present

## 2018-10-01 DIAGNOSIS — J45909 Unspecified asthma, uncomplicated: Secondary | ICD-10-CM | POA: Diagnosis not present

## 2018-10-01 DIAGNOSIS — Z79899 Other long term (current) drug therapy: Secondary | ICD-10-CM | POA: Insufficient documentation

## 2018-10-01 DIAGNOSIS — R110 Nausea: Secondary | ICD-10-CM | POA: Diagnosis not present

## 2018-10-01 DIAGNOSIS — E119 Type 2 diabetes mellitus without complications: Secondary | ICD-10-CM | POA: Insufficient documentation

## 2018-10-01 DIAGNOSIS — G43909 Migraine, unspecified, not intractable, without status migrainosus: Secondary | ICD-10-CM | POA: Diagnosis not present

## 2018-10-01 DIAGNOSIS — R197 Diarrhea, unspecified: Secondary | ICD-10-CM | POA: Diagnosis not present

## 2018-10-01 LAB — CBG MONITORING, ED: Glucose-Capillary: 136 mg/dL — ABNORMAL HIGH (ref 70–99)

## 2018-10-01 LAB — I-STAT CREATININE, ED: Creatinine, Ser: 0.8 mg/dL (ref 0.44–1.00)

## 2018-10-01 LAB — POCT I-STAT EG7
Acid-Base Excess: 6 mmol/L — ABNORMAL HIGH (ref 0.0–2.0)
Bicarbonate: 32 mmol/L — ABNORMAL HIGH (ref 20.0–28.0)
Calcium, Ion: 1.05 mmol/L — ABNORMAL LOW (ref 1.15–1.40)
HCT: 37 % (ref 36.0–46.0)
Hemoglobin: 12.6 g/dL (ref 12.0–15.0)
O2 Saturation: 65 %
Potassium: 3.3 mmol/L — ABNORMAL LOW (ref 3.5–5.1)
Sodium: 139 mmol/L (ref 135–145)
TCO2: 34 mmol/L — ABNORMAL HIGH (ref 22–32)
pCO2, Ven: 50.4 mmHg (ref 44.0–60.0)
pH, Ven: 7.411 (ref 7.250–7.430)
pO2, Ven: 34 mmHg (ref 32.0–45.0)

## 2018-10-01 MED ORDER — HYDROCODONE-ACETAMINOPHEN 7.5-325 MG PO TABS
1.0000 | ORAL_TABLET | ORAL | Status: AC
  Administered 2018-10-01: 1 via ORAL
  Filled 2018-10-01: qty 1

## 2018-10-01 MED ORDER — ONDANSETRON 8 MG PO TBDP
8.0000 mg | ORAL_TABLET | Freq: Once | ORAL | Status: AC
  Administered 2018-10-01: 8 mg via ORAL
  Filled 2018-10-01: qty 1

## 2018-10-01 MED ORDER — HYDROCODONE-ACETAMINOPHEN 7.5-325 MG/15ML PO SOLN: 6.0000 mg/kg | Freq: Once | ORAL | Status: DC

## 2018-10-01 NOTE — ED Triage Notes (Signed)
Patient here from home with complaints of headache and nausea x3 days. Hx of same also diabetic with CHF.

## 2018-10-01 NOTE — ED Provider Notes (Addendum)
[facility]-EMERGENCY DEPT Provider Note   CSN: [id number] Arrival date & time: 10/01/18  1138    History   Chief Complaint Chief Complaint  Patient presents with  . Nausea  . Headache    HPI DESIRIE [name] is a 48 y.o. female.     HPI   She presents for evaluation of headache present for 3 days, worsening, last night kept her up.  She also has ongoing nausea for several weeks.  Recently evaluated by her cardiologist, per virtual visit, 2 weeks ago, at that time he restarted her Entresto and prescribed Zofran for nausea.  She is apparently taking these medications as directed.  She denies fever, chills, vomiting, diarrhea, abdominal or back pain.  She presents for evaluation, by EMS.  No known sick contacts at home.  No known exposure to Covid-19.  There are no other known modifying factors.  Past Medical History:  Diagnosis Date  . Anemia    hx of iron transfusions - last one 10/2013   . Anxiety   . Arthritis    neck and shoulders   . Asthma   . Back pain, chronic   . Bipolar disorder (HCC)   . Carpal tunnel syndrome on both sides   . Cataract   . Depression   . Diabetes mellitus   . Fibromyalgia   . GERD (gastroesophageal reflux disease)   . History of gastric bypass Jan 2013  . Hyperlipidemia   . Hypertension   . Insomnia   . Migraines   . Nervous breakdown sept 2013  . Neuropathy   . Nonischemic cardiomyopathy (HCC)   . OSA (obstructive sleep apnea) 02/21/2015  . Peripheral vascular disease (HCC)   . Shortness of breath    with exertion   . Sleep apnea    borderline- no cpap yet  . Systolic CHF, acute on chronic (HCC)    CARDIOLOGIST-- DR Aris Lot NELSON  (Corinda [place])  . Wound of left breast     Patient Active Problem List   Diagnosis Date Noted  . Syncope 08/01/2018  . AKI (acute kidney injury) (HCC) 08/01/2018  . Menorrhagia with irregular cycle 06/02/2018  . Palpitations 08/18/2017  . Hypokalemia 08/18/2017  . Elevated LFTs  12/24/2015  . OSA (obstructive sleep apnea) 02/21/2015  . Excessive daytime sleepiness 11/29/2014  . Snoring 11/29/2014  . Goiter 09/06/2014  . Fatigue 09/06/2014  . Chronic systolic CHF (congestive heart failure) (HCC) 05/31/2014  . Open breast wound 12/26/2013  . Breast injury 12/05/2013  . Left breast abscess 11/26/2013  . Chronic systolic heart failure (HCC) 11/03/2013  . History of iron deficiency anemia 10/12/2013  . History of gastric bypass 03/13/2012  . Anxiety disorder 03/13/2012  . Nervous breakdown 03/13/2012  . Hematochezia 03/13/2012  . Iron deficiency anemia due to chronic blood loss 03/13/2012  . DM type 2 (diabetes mellitus, type 2) (HCC) 03/13/2012  . Generalized weakness 03/13/2012  . Generalized abdominal pain 03/13/2012  . Hyperactive bowel sounds 03/13/2012  . Dehydration 03/13/2012  . Insomnia 03/13/2012  . Diabetic neuropathy (HCC) 03/13/2012  . Migraine, unspecified, without mention of intractable migraine without mention of status migrainosus 03/13/2012  . Major depressive disorder, recurrent episode, moderate (HCC) 02/18/2012    Class: Acute  . Compulsive behavior disorder (HCC) 02/18/2012    Class: Chronic  . Nausea and vomiting 07/21/2011  . Morbid obesity (HCC) 06/25/2011  . IDDM (insulin dependent diabetes mellitus) (HCC) 06/25/2011  . Degenerative disc disease 06/25/2011    Past Surgical  History:  Procedure Laterality Date  . BACK SURGERY  November 1992  . BREAST SURGERY Left    I&D for abcsess   . CARDIAC CATHETERIZATION  11-14-2013  DR Marca Ancona   NORMAL LV FILLING PRESSURE W/ MILD ELEVATED RV FILLING, POSSIBLY SUFFESTING RV FAILURE OUT OF PROPORTION TO LV FAILURE. EF ABOUT 35% WITH DIFFUSE HYPOKINESIS. NO ANGIOGRAPHIC CAD, NONISCHEMIC CARDIOMYOPATHY  . CESAREAN SECTION  December 1998  . CHOLECYSTECTOMY  1998  . DILITATION & CURRETTAGE/HYSTROSCOPY WITH NOVASURE ABLATION N/A 02/26/2014   Procedure: DILATATION & CURETTAGE/HYSTEROSCOPY  WITH NOVASURE ABLATION;  Surgeon: Meriel Pica, MD;  Location: [facility];  Service: Gynecology;  Laterality: N/A;  . ESOPHAGOGASTRODUODENOSCOPY  07/27/2011   Procedure: ESOPHAGOGASTRODUODENOSCOPY (EGD);  Surgeon: Freddy Jaksch, MD;  Location: [facility];  Service: Endoscopy;  Laterality: N/A;  . GASTRIC ROUX-EN-Y  06/30/2011   Procedure: LAPAROSCOPIC ROUX-EN-Y GASTRIC;  Surgeon: Mariella Saa, MD;  Location: [facility];  Service: General;  Laterality: N/A;     . INCISION AND DRAINAGE OF WOUND Left 12/14/2013   Procedure: IRRIGATION AND DEBRIDEMENT OF LEFT BREAST WOUND WITH PLACEMENT OF A-CELL, WITH CLOSURE ;  Surgeon: Wayland Denis, DO;  Location: [facility];  Service: Plastics;  Laterality: Left;  . IRRIGATION AND DEBRIDEMENT ABSCESS Left 11/26/2013   Procedure: IRRIGATION AND DEBRIDEMENT ABSCESS;  Surgeon: Velora Heckler, MD;  Location: [facility];  Service: General;  Laterality: Left;  . LEFT AND RIGHT HEART CATHETERIZATION WITH CORONARY ANGIOGRAM N/A 11/14/2013   Procedure: LEFT AND RIGHT HEART CATHETERIZATION WITH CORONARY ANGIOGRAM;  Surgeon: Laurey Morale, MD;  Location: [facility] C[facility] LAB;  Service: Cardiovascular;  Laterality: N/A;  . SPINAL CORD STIMULATOR INSERTION N/A 03/09/2014   Procedure: LUMBAR SPINAL CORD STIMULATOR INSERTION;  Surgeon: Gwynne Edinger, MD;  Location: [facility];  Service: Neurosurgery;  Laterality: N/A;  . TUBAL LIGATION  January 1999     OB History    Gravida  1   Para  1   Term      Preterm  1   AB      Living  1     SAB      TAB      Ectopic      Multiple      Live Births  1            Home Medications    Prior to Admission medications   Medication Sig Start Date End Date Taking? Authorizing Provider  Biotin 5000 MCG TABS Take 5,000 mcg by mouth daily.    [provider]  buPROPion (WELLBUTRIN XL) 300 MG 24 hr tablet Take 300 mg by mouth daily before breakfast.  02/23/13   [provider]  Cholecalciferol (VITAMIN D3) 5000  units TABS Take 5,000 Units by mouth daily.     [provider]  cyclobenzaprine (FLEXERIL) 10 MG tablet Take 10 mg by mouth 3 (three) times daily as needed for muscle spasms.  11/23/13   [provider]  diclofenac sodium (VOLTAREN) 1 % GEL Apply 2 g topically 2 (two) times daily.    [provider]  DULoxetine (CYMBALTA) 60 MG capsule Take 60 mg by mouth daily.    [provider]  fluticasone (FLONASE) 50 MCG/ACT nasal spray Place 1 spray into both nostrils daily.  03/07/13   [provider]  ibuprofen (ADVIL,MOTRIN) 800 MG tablet Take 800 mg by mouth every 8 (eight) hours as needed (pain).     [provider]  Insulin Glargine (LANTUS SOLOSTAR) 100 UNIT/ML Solostar Pen Inject 36 Units into the skin daily.     [provider]  lidocaine (LIDODERM) 5 % Place 1 patch onto the skin daily as needed (for back pain).  03/21/13   [provider]  lubiprostone (AMITIZA) 24 MCG capsule Take 24 mcg by mouth 2 (two) times daily as needed for constipation.    [provider]  metoprolol succinate (TOPROL-XL) 50 MG 24 hr tablet TAKE 1 TABLET (50 MG TOTAL) BY MOUTH 2 (TWO) TIMES DAILY WITH A MEAL. TAKE WITH OR IMMEDIATELY FOLLOWING A MEAL. 09/09/18   Berton Bon, NP  ondansetron (ZOFRAN) 4 MG tablet Take 1 tablet (4 mg total) by mouth every 8 (eight) hours as needed for nausea or vomiting. 09/13/18   Laurey Morale, MD  oxyCODONE-acetaminophen (PERCOCET) 10-325 MG per tablet Take 0.5-1 tablets by mouth every 4 (four) hours as needed for pain.    [provider]  polyethylene glycol (MIRALAX / GLYCOLAX) packet Take 17 g by mouth 3 (three) times a week.    [provider]  pregabalin (LYRICA) 150 MG capsule Take 150 mg by mouth 3 (three) times daily.    [provider]  ranitidine (ZANTAC) 150 MG tablet Take 150 mg by mouth daily. 02/11/13   [provider]  rosuvastatin (CRESTOR) 5 MG tablet Take 1  tablet (5 mg total) by mouth daily. 11/23/17 08/01/18  Laurey Morale, MD  sacubitril-valsartan (ENTRESTO) 24-26 MG Take 1 tablet by mouth 2 (two) times daily. 09/13/18   Laurey Morale, MD  VENTOLIN HFA 108 (90 BASE) MCG/ACT inhaler INHALE 2 PUFFS BY MOUTH EVERY 4 TO 6 HOURS AS NEEDED FOR FOR SHORTNESS OF BREATH OR WHEEZING Patient taking differently: Inhale 2 puffs into the lungs. Every 4 to 6 hours as needed for wheezing or shortness of breath 02/07/15   Lars Masson, MD  VICTOZA 18 MG/3ML SOPN Inject 1.8 mg into the skin daily. 03/14/16   [provider]    Family History Family History  Problem Relation Age of Onset  . Hypertension Mother   . Diabetes Maternal Aunt   . Diabetes Paternal Aunt   . Diabetes Paternal Grandfather   . Hypertension Paternal Grandfather   . Diabetes Paternal Grandmother   . Heart disease Brother   . Heart attack Neg Hx   . Stroke Neg Hx   . Colon cancer Neg Hx   . Colon polyps Neg Hx   . Esophageal cancer Neg Hx   . Rectal cancer Neg Hx   . Stomach cancer Neg Hx   . Breast cancer Neg Hx     Social History Social History   Tobacco Use  . Smoking status: Never Smoker  . Smokeless tobacco: Never Used  Substance Use Topics  . Alcohol use: No  . Drug use: No     Allergies   Imitrex [sumatriptan]   Review of Systems Review of Systems  All other systems reviewed and are negative.    Physical Exam Updated Vital Signs BP (!) 157/100 (BP Location: Left Arm)   Pulse 87   Temp 98.9 F (37.2 C) (Oral)   Resp 18   SpO2 100%   Physical Exam Vitals signs and nursing note reviewed.  Constitutional:      General: She is not in acute distress.    Appearance: She is well-developed. She is not ill-appearing or diaphoretic.  HENT:     Head: Normocephalic and atraumatic.  Right Ear: External ear normal.     Left Ear: External ear normal.  Eyes:     Conjunctiva/sclera: Conjunctivae normal.     Pupils: Pupils are equal,  round, and reactive to light.  Neck:     Musculoskeletal: Normal range of motion and neck supple.     Trachea: Phonation normal.  Cardiovascular:     Rate and Rhythm: Normal rate and regular rhythm.     Heart sounds: Normal heart sounds.  Pulmonary:     Effort: Pulmonary effort is normal.     Breath sounds: Normal breath sounds.  Abdominal:     Palpations: Abdomen is soft.     Tenderness: There is no abdominal tenderness.  Musculoskeletal: Normal range of motion.        General: No swelling or tenderness.     Right lower leg: No edema.     Left lower leg: No edema.  Skin:    General: Skin is warm and dry.  Neurological:     General: No focal deficit present.     Mental Status: She is alert and oriented to person, place, and time.     Cranial Nerves: No cranial nerve deficit.     Sensory: No sensory deficit.     Motor: No abnormal muscle tone.     Coordination: Coordination normal.     Comments: No dysarthria, aphasia or nystagmus.  Psychiatric:        Mood and Affect: Mood normal.        Behavior: Behavior normal.        Thought Content: Thought content normal.        Judgment: Judgment normal.      ED Treatments / Results  Labs (all labs ordered are listed, but only abnormal results are displayed) Labs Reviewed  CBG MONITORING, ED - Abnormal; Notable for the following components:      Result Value   Glucose-Capillary 136 (*)    All other components within normal limits  POCT I-STAT EG7 - Abnormal; Notable for the following components:   Bicarbonate 32.0 (*)    TCO2 34 (*)    Acid-Base Excess 6.0 (*)    Potassium 3.3 (*)    Calcium, Ion 1.05 (*)    All other components within normal limits  I-STAT CREATININE, ED    EKG None  Radiology No results found.  Procedures Procedures (including critical care time)  Medications Ordered in ED Medications  ondansetron (ZOFRAN-ODT) disintegrating tablet 8 mg (8 mg Oral Given 10/01/18 1259)   HYDROcodone-acetaminophen (NORCO) 7.5-325 MG per tablet 1 tablet (1 tablet Oral Given 10/01/18 1259)     Initial Impression / Assessment and Plan / ED Course  I have reviewed the triage vital signs and the nursing notes.  Pertinent labs & imaging results that were available during my care of the patient were reviewed by me and considered in my medical decision making (see chart for details).  Clinical Course as of Sep 30 1417  Sat Oct 01, 2018  1406 Normal  I-stat Creatinine, ED [EW]  1407 Elevated  CBG monitoring, ED(!) [EW]  1407 Normal except bicarb elevated, PCO2 high, acid-base high, potassium low, calcium low   [EW]  1407 Elevated  BP(!): 157/100 [EW]    Clinical Course User Index [EW] Mancel Bale, MD        Patient Vitals for the past 24 hrs:  BP Temp Temp src Pulse Resp SpO2  10/01/18 1156 (!) 157/100 98.9 F (37.2 C) Oral 87  18 100 %  10/01/18 1147 (!) 142/93 98.5 F (36.9 C) Oral 88 18 95 %    2:07 PM Reevaluation with update and discussion. After initial assessment and treatment, an updated evaluation reveals that she feels better after the treatment that is initiated.  She states that she has medications at home for nausea and pain, findings discussed with the patient and all questions were answered. Mancel Bale   Medical Decision Making: Nonspecific headache, with reassuring evaluation.  She was treated symptomatically in the emergency department, with improvement.  Doubt CVA, ACS, PE or pneumonia.  Mild hypokalemia, indication for starting supplementation, at this time.  Note that she was recently started on Entresto.  CRITICAL CARE-no Performed by: Mancel Bale  Nursing Notes Reviewed/ Care Coordinated Applicable Imaging Reviewed Interpretation of Laboratory Data incorporated into ED treatment  The patient appears reasonably screened and/or stabilized for discharge and I doubt any other medical condition or other [facility] requiring further screening,  evaluation, or treatment in the ED at this time prior to discharge.  Plan: Home Medications-continue usual medications; Home Treatments-rest, fluids; return here if the recommended treatment, does not improve the symptoms; Recommended follow up-PCP, PRN   Final Clinical Impressions(s) / ED Diagnoses   Final diagnoses:  Nausea  Nonintractable headache, unspecified chronicity pattern, unspecified headache type    ED Discharge Orders    None           Mancel Bale, MD 10/01/18 1419

## 2018-10-03 ENCOUNTER — Inpatient Hospital Stay: Payer: Medicare HMO

## 2018-10-03 ENCOUNTER — Inpatient Hospital Stay: Payer: Medicare HMO | Admitting: Hematology

## 2018-10-04 ENCOUNTER — Telehealth: Payer: Self-pay | Admitting: Cardiology

## 2018-10-04 NOTE — Telephone Encounter (Signed)
Follow Up:; ° ° °Returning your call. °

## 2018-10-04 NOTE — Telephone Encounter (Signed)
Patient has a 10 week follow up appointment scheduled for 12/19/2018. Patient understands she needs to keep this appointment for insurance compliance. Patient was grateful for the call and thanked me.

## 2018-10-04 NOTE — Telephone Encounter (Signed)
YOUR CARDIOLOGY TEAM HAS ARRANGED FOR AN E-VISIT FOR YOUR APPOINTMENT - PLEASE REVIEW IMPORTANT INFORMATION BELOW SEVERAL DAYS PRIOR TO YOUR APPOINTMENT ° °Due to the recent COVID-19 pandemic, we are transitioning in-person office visits to tele-medicine visits in an effort to decrease unnecessary exposure to our patients, their families, and staff. These visits are billed to your insurance just like a normal visit is. We also encourage you to sign up for MyChart if you have not already done so. You will need a smartphone if possible. For patients that do not have this, we can still complete the visit using a regular telephone but do prefer a smartphone to enable video when possible. You may have a family member that lives with you that can help. If possible, we also ask that you have a blood pressure cuff and scale at home to measure your blood pressure, heart rate and weight prior to your scheduled appointment. Patients with clinical needs that need an in-person evaluation and testing will still be able to come to the office if absolutely necessary. If you have any questions, feel free to call our office. ° ° ° ° °YOUR PROVIDER WILL BE USING THE FOLLOWING PLATFORM TO COMPLETE YOUR VISIT: Staff: Please delete this text and fill in MyChart/[email or web address] ° °• IF USING MYCHART - How to Download the MyChart App to Your SmartPhone  ° °- If Apple, go to App Store and type in MyChart in the search bar and download the app. If Android, ask patient to go to Google Play Store and type in MyChart in the search bar and download the app. The app is free but as with any other app downloads, your phone may require you to verify saved payment information or Apple/Android password.  °- You will need to then log into the app with your MyChart username and password, and select [facility] as your healthcare provider to link the account.  °- When it is time for your visit, go to the MyChart app, find appointments, and click Begin  Video Visit. Be sure to Select Allow for your device to access the Microphone and Camera for your visit. You will then be connected, and your provider will be with you shortly. ° **If you have any issues connecting or need assistance, please contact MyChart service desk [phone] ([phone])** ° **If using a computer, in order to ensure the best quality for your visit, you will need to use either of the following Internet Browsers: Google Chrome or Microsoft Edge** ° °• IF USING DOXIMITY or [email or web address] - The staff will give you instructions on receiving your link to join the meeting the day of your visit.  ° ° ° ° °2-3 DAYS BEFORE YOUR APPOINTMENT ° °You will receive a telephone call from one of our [facility] team members - your caller ID may say "Unknown caller." If this is a video visit, we will walk you through how to get the video launched on your phone. We will remind you check your blood pressure, heart rate and weight prior to your scheduled appointment. If you have an Apple Watch or Kardia, please upload any pertinent ECG strips the day before or morning of your appointment to MyChart. Our staff will also make sure you have reviewed the consent and agree to move forward with your scheduled tele-health visit.  ° ° ° °THE DAY OF YOUR APPOINTMENT ° °Approximately 15 minutes prior to your scheduled appointment, you will receive a telephone call from one of [facility] team -   your caller ID may say "Unknown caller."  Our staff will confirm medications, vital signs for the day and any symptoms you may be experiencing. Please have this information available prior to the time of visit start. It may also be helpful for you to have a pad of paper and pen handy for any instructions given during your visit. They will also walk you through joining the smartphone meeting if this is a video visit. ° ° ° °CONSENT FOR TELE-HEALTH VISIT - PLEASE REVIEW ° °I hereby voluntarily request, consent and authorize [facility] and  its employed or contracted physicians, physician assistants, nurse practitioners or other licensed health care professionals (the Practitioner), to provide me with telemedicine health care services (the “Services") as deemed necessary by the treating Practitioner. I acknowledge and consent to receive the Services by the Practitioner via telemedicine. I understand that the telemedicine visit will involve communicating with the Practitioner through live audiovisual communication technology and the disclosure of certain medical information by electronic transmission. I acknowledge that I have been given the opportunity to request an in-person assessment or other available alternative prior to the telemedicine visit and am voluntarily participating in the telemedicine visit. ° °I understand that I have the right to withhold or withdraw my consent to the use of telemedicine in the course of my care at any time, without affecting my right to future care or treatment, and that the Practitioner or I may terminate the telemedicine visit at any time. I understand that I have the right to inspect all information obtained and/or recorded in the course of the telemedicine visit and may receive copies of available information for a reasonable fee.  I understand that some of the potential risks of receiving the Services via telemedicine include:  °• Delay or interruption in medical evaluation due to technological equipment failure or disruption; °• Information transmitted may not be sufficient (e.g. poor resolution of images) to allow for appropriate medical decision making by the Practitioner; and/or  °• In rare instances, security protocols could fail, causing a breach of personal health information. ° °Furthermore, I acknowledge that it is my responsibility to provide information about my medical history, conditions and care that is complete and accurate to the best of my ability. I acknowledge that Practitioner's advice,  recommendations, and/or decision may be based on factors not within their control, such as incomplete or inaccurate data provided by me or distortions of diagnostic images or specimens that may result from electronic transmissions. I understand that the practice of medicine is not an exact science and that Practitioner makes no warranties or guarantees regarding treatment outcomes. I acknowledge that I will receive a copy of this consent concurrently upon execution via email to the email address I last provided but may also request a printed copy by calling the office of [facility].   ° °I understand that my insurance will be billed for this visit.  ° °I have read or had this consent read to me. °• I understand the contents of this consent, which adequately explains the benefits and risks of the Services being provided via telemedicine.  °• I have been provided ample opportunity to ask questions regarding this consent and the Services and have had my questions answered to my satisfaction. °• I give my informed consent for the services to be provided through the use of telemedicine in my medical care ° °By participating in this telemedicine visit I agree to the above. °

## 2018-10-05 ENCOUNTER — Telehealth: Payer: Self-pay | Admitting: Hematology

## 2018-10-05 NOTE — Telephone Encounter (Signed)
Returning patient's phone call regarding scheduling an appointment, left a voicemail.

## 2018-10-06 NOTE — Telephone Encounter (Signed)
Returned call to patient, she has a virtual visit for 7/6.

## 2018-10-19 DIAGNOSIS — E113591 Type 2 diabetes mellitus with proliferative diabetic retinopathy without macular edema, right eye: Secondary | ICD-10-CM | POA: Diagnosis not present

## 2018-10-19 DIAGNOSIS — E113512 Type 2 diabetes mellitus with proliferative diabetic retinopathy with macular edema, left eye: Secondary | ICD-10-CM | POA: Diagnosis not present

## 2018-10-19 DIAGNOSIS — G4733 Obstructive sleep apnea (adult) (pediatric): Secondary | ICD-10-CM | POA: Diagnosis not present

## 2018-11-06 ENCOUNTER — Other Ambulatory Visit (HOSPITAL_COMMUNITY): Payer: Self-pay | Admitting: Cardiology

## 2018-11-08 NOTE — Telephone Encounter (Signed)
Ok to refill? Please advise.  

## 2018-11-12 ENCOUNTER — Other Ambulatory Visit (HOSPITAL_COMMUNITY): Payer: Self-pay | Admitting: Cardiology

## 2018-11-14 ENCOUNTER — Telehealth (HOSPITAL_COMMUNITY): Payer: Self-pay | Admitting: *Deleted

## 2018-11-14 DIAGNOSIS — I5022 Chronic systolic (congestive) heart failure: Secondary | ICD-10-CM

## 2018-11-14 NOTE — Telephone Encounter (Signed)
Pt left VM stating she was told to call the office if her weight increases since stopping diuretic. Pt states her weight has gradually increased and she thinks its fluid. Wants to know if she can restart diuretic and if so at what dose? Pt denies shortness of breath, chest pain, but does admit to mild edema in feet and ankles.   Routed to Dr.McLean

## 2018-11-15 MED ORDER — TORSEMIDE 20 MG PO TABS: 40.0000 mg | ORAL_TABLET | Freq: Every day | ORAL | 3 refills | Status: DC

## 2018-11-15 NOTE — Telephone Encounter (Signed)
She was on torsemide 80 mg daily in the past it appears.  Have her restart at 40 mg daily but will need BMET on Friday.

## 2018-11-15 NOTE — Telephone Encounter (Signed)
Spoke with patient, advised to start 40mg  Torsemide daily per previous MD note.  Rx sent to pharmacy. Pt will repeat blood work on Friday per MD.

## 2018-11-18 ENCOUNTER — Encounter (HOSPITAL_COMMUNITY): Payer: Medicare HMO

## 2018-11-19 DIAGNOSIS — G4733 Obstructive sleep apnea (adult) (pediatric): Secondary | ICD-10-CM | POA: Diagnosis not present

## 2018-11-21 ENCOUNTER — Other Ambulatory Visit (HOSPITAL_COMMUNITY): Payer: Medicare HMO

## 2018-11-22 ENCOUNTER — Other Ambulatory Visit (HOSPITAL_COMMUNITY): Payer: Medicare HMO

## 2018-11-22 DIAGNOSIS — M5136 Other intervertebral disc degeneration, lumbar region: Secondary | ICD-10-CM | POA: Diagnosis not present

## 2018-11-22 DIAGNOSIS — Z6833 Body mass index (BMI) 33.0-33.9, adult: Secondary | ICD-10-CM | POA: Diagnosis not present

## 2018-11-22 DIAGNOSIS — M5416 Radiculopathy, lumbar region: Secondary | ICD-10-CM | POA: Diagnosis not present

## 2018-11-22 DIAGNOSIS — M961 Postlaminectomy syndrome, not elsewhere classified: Secondary | ICD-10-CM | POA: Diagnosis not present

## 2018-12-05 ENCOUNTER — Other Ambulatory Visit: Payer: Self-pay | Admitting: Cardiology

## 2018-12-05 ENCOUNTER — Encounter (HOSPITAL_COMMUNITY): Payer: Self-pay

## 2018-12-05 ENCOUNTER — Other Ambulatory Visit: Payer: Self-pay

## 2018-12-05 ENCOUNTER — Ambulatory Visit (HOSPITAL_COMMUNITY)
Admission: RE | Admit: 2018-12-05 | Discharge: 2018-12-05 | Disposition: A | Discharge status: Home / Self Care | Payer: Medicare HMO | Source: Ambulatory Visit | Attending: Cardiology | Admitting: Cardiology

## 2018-12-05 DIAGNOSIS — I5022 Chronic systolic (congestive) heart failure: Secondary | ICD-10-CM

## 2018-12-05 LAB — BASIC METABOLIC PANEL
Anion gap: 9 (ref 5–15)
BUN: 10 mg/dL (ref 6–20)
CO2: 23 mmol/L (ref 22–32)
Calcium: 8.5 mg/dL — ABNORMAL LOW (ref 8.9–10.3)
Chloride: 109 mmol/L (ref 98–111)
Creatinine, Ser: 0.87 mg/dL (ref 0.44–1.00)
GFR calc Af Amer: >60 mL/min (ref >60)
GFR calc non Af Amer: >60 mL/min (ref >60)
Glucose, Bld: 157 mg/dL — ABNORMAL HIGH (ref 70–99)
Potassium: 3.4 mmol/L — ABNORMAL LOW (ref 3.5–5.1)
Sodium: 141 mmol/L (ref 135–145)

## 2018-12-08 ENCOUNTER — Telehealth: Payer: Self-pay

## 2018-12-08 NOTE — Telephone Encounter (Signed)
Called and spoke with patient to offer in office visit instead of virtual and she explained she felt better doing virtual visit. I got her verbal consent and confirmed she would not come into the office for appointment, that we would be calling her.

## 2018-12-08 NOTE — Telephone Encounter (Signed)

## 2018-12-09 ENCOUNTER — Ambulatory Visit (HOSPITAL_COMMUNITY)
Admission: RE | Admit: 2018-12-09 | Discharge: 2018-12-09 | Disposition: A | Discharge status: Home / Self Care | Payer: Medicare HMO | Source: Ambulatory Visit | Attending: Cardiology | Admitting: Cardiology

## 2018-12-09 ENCOUNTER — Other Ambulatory Visit: Payer: Self-pay

## 2018-12-09 ENCOUNTER — Encounter (HOSPITAL_COMMUNITY): Payer: Self-pay

## 2018-12-09 DIAGNOSIS — I5022 Chronic systolic (congestive) heart failure: Secondary | ICD-10-CM | POA: Diagnosis not present

## 2018-12-09 NOTE — Patient Instructions (Addendum)
No medication changes today!!  Your physician recommends that you schedule a follow-up appointment in: 6 weeks with Dr Aundra Dubin.  You will get a call to schedule this appointment.   At the [facility], you and your health needs are our priority. As part of our continuing mission to provide you with exceptional heart care, we have created designated Provider Care Teams. These Care Teams include your primary Cardiologist (physician) and Advanced Practice Providers (APPs- Physician Assistants and Nurse Practitioners) who all work together to provide you with the care you need, when you need it.   You may see any of the following providers on your designated Care Team at your next follow up: Marland Kitchen Dr Glori Bickers . Dr Loralie Champagne . Darrick Grinder, NP

## 2018-12-09 NOTE — Progress Notes (Signed)
Called patient, AVS discussed. Pt verbalized understand appreciation. AVS sent via my chart  No medication changes today!!  Your physician recommends that you schedule a follow-up appointment in: 6 weeks with Dr Aundra Dubin.  You will get a call to schedule this appointment.

## 2018-12-11 ENCOUNTER — Other Ambulatory Visit (HOSPITAL_COMMUNITY): Payer: Self-pay | Admitting: Cardiology
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
## 2018-12-18 DIAGNOSIS — I10 Essential (primary) hypertension: Secondary | ICD-10-CM | POA: Insufficient documentation

## 2018-12-18 NOTE — Progress Notes (Signed)
Virtual Visit via Video Note   This visit type was conducted due to national recommendations for restrictions regarding the COVID-19 Pandemic (e.g. social distancing) in an effort to limit this patient's exposure and mitigate transmission in our community.  Due to her co-morbid illnesses, this patient is at least at moderate risk for complications without adequate follow up.  This format is felt to be most appropriate for this patient at this time.  All issues noted in this document were discussed and addressed.  A limited physical exam was performed with this format.  Please refer to the patient's chart for her consent to telehealth for [name].  Evaluation Performed:  Follow-up visit  This visit type was conducted due to national recommendations for restrictions regarding the COVID-19 Pandemic (e.g. social distancing).  This format is felt to be most appropriate for this patient at this time.  All issues noted in this document were discussed and addressed.  No physical exam was performed (except for noted visual exam findings with Video Visits).  Please refer to the patient's chart (MyChart message for video visits and phone note for telephone visits) for the patient's consent to telehealth for [facility].  Date:  12/19/2018   ID:  Linda SpeakShannon J Langston, DOB 1970-09-12, MRN [id number]  Patient Location:  Home  Provider location:   [place]  PCP:  Ronal FearLam, Lynn E, NP  Cardiologist:  Marca Anconaalton McLean, MD Sleep Medicine;  Armanda Magicraci Turner, MD Electrophysiologist:  None   Chief Complaint:  OSA  History of Present Illness:    [name] is a 48 y.o. female who presents via audio/video conferencing for a telehealth visit today.    This is a 47yo obese AAF with a hx of CHF who was referred by Dr Shirlee LatchMcLean for evaluation of snoring and excessive daytime fatigue.  She underwent PSG showing mild OSA with an AHI of 6.1/hr with no central component and oxygen desaturations as low as 82%.  She  underwent CPAP titration to 9cm H2O.    She is doing well with her CPAP device and thinks that she has gotten used to it.  She tolerates the mask and feels the pressure is adequate.  Since going on CPAP she feels rested in the am and has no significant daytime sleepiness.  She denies any significant mouth or nasal dryness or nasal congestion.  She does not think that he snores.    The patient does not have symptoms concerning for COVID-19 infection (fever, chills, cough, or new shortness of breath).    Prior CV studies:   The following studies were reviewed today:  PAP compliance download  Past Medical History:  Diagnosis Date  . Anemia    hx of iron transfusions - last one 10/2013   . Anxiety   . Arthritis    neck and shoulders   . Asthma   . Back pain, chronic   . Bipolar disorder (HCC)   . Carpal tunnel syndrome on both sides   . Cataract   . Depression   . Diabetes mellitus   . Fibromyalgia   . GERD (gastroesophageal reflux disease)   . History of gastric bypass Jan 2013  . Hyperlipidemia   . Hypertension   . Insomnia   . Migraines   . Nervous breakdown sept 2013  . Neuropathy   . Nonischemic cardiomyopathy (HCC)   . OSA (obstructive sleep apnea) 02/21/2015  . Peripheral vascular disease (HCC)   . Shortness of breath    with exertion   .  Sleep apnea    borderline- no cpap yet  . Systolic CHF, acute on chronic (HCC)    CARDIOLOGIST-- DR Aris LotKATARINA NELSON  (Corinda GublerLEBAUER)  . Wound of left breast    Past Surgical History:  Procedure Laterality Date  . BACK SURGERY  November 1992  . BREAST SURGERY Left    I&D for abcsess   . CARDIAC CATHETERIZATION  11-14-2013  DR Marca AnconaALTON MCLEAN   NORMAL LV FILLING PRESSURE W/ MILD ELEVATED RV FILLING, POSSIBLY SUFFESTING RV FAILURE OUT OF PROPORTION TO LV FAILURE. EF ABOUT 35% WITH DIFFUSE HYPOKINESIS. NO ANGIOGRAPHIC CAD, NONISCHEMIC CARDIOMYOPATHY  . CESAREAN SECTION  December 1998  . CHOLECYSTECTOMY  1998  . DILITATION &  CURRETTAGE/HYSTROSCOPY WITH NOVASURE ABLATION N/A 02/26/2014   Procedure: DILATATION & CURETTAGE/HYSTEROSCOPY WITH NOVASURE ABLATION;  Surgeon: Meriel Picaichard M Holland, MD;  Location: [facility];  Service: Gynecology;  Laterality: N/A;  . ESOPHAGOGASTRODUODENOSCOPY  07/27/2011   Procedure: ESOPHAGOGASTRODUODENOSCOPY (EGD);  Surgeon: Freddy JakschWilliam M Outlaw, MD;  [facility];  Service: Endoscopy;  Laterality: N/A;  . GASTRIC ROUX-EN-Y  06/30/2011   Procedure: LAPAROSCOPIC ROUX-EN-Y GASTRIC;  Surgeon: Mariella SaaBenjamin T Hoxworth, MD;  Location: [facility];  Service: General;  Laterality: N/A;     . INCISION AND DRAINAGE OF WOUND Left 12/14/2013   Procedure: IRRIGATION AND DEBRIDEMENT OF LEFT BREAST WOUND WITH PLACEMENT OF A-CELL, WITH CLOSURE ;  Surgeon: Wayland Denislaire Sanger, DO;  Location: [facility];  Service: Plastics;  Laterality: Left;  . IRRIGATION AND DEBRIDEMENT ABSCESS Left 11/26/2013   Procedure: IRRIGATION AND DEBRIDEMENT ABSCESS;  Surgeon: Velora Hecklerodd M Gerkin, MD;  Location: [facility];  Service: General;  Laterality: Left;  . LEFT AND RIGHT HEART CATHETERIZATION WITH CORONARY ANGIOGRAM N/A 11/14/2013   Procedure: LEFT AND RIGHT HEART CATHETERIZATION WITH CORONARY ANGIOGRAM;  Surgeon: Laurey Moralealton S McLean, MD;  [facility];  Service: Cardiovascular;  Laterality: N/A;  . SPINAL CORD STIMULATOR INSERTION N/A 03/09/2014   Procedure: LUMBAR SPINAL CORD STIMULATOR INSERTION;  Surgeon: Gwynne EdingerPaul C Harkins, MD;  Location: [facility];  Service: Neurosurgery;  Laterality: N/A;  . TUBAL LIGATION  January 1999     Current Meds  Medication Sig  . Biotin 5000 MCG TABS Take 5,000 mcg by mouth daily.  Marland Kitchen. buPROPion (WELLBUTRIN XL) 300 MG 24 hr tablet Take 300 mg by mouth daily before breakfast.   . Cholecalciferol (VITAMIN D3) 5000 units TABS Take 5,000 Units by mouth daily.   . cyclobenzaprine (FLEXERIL) 10 MG tablet Take 10 mg by mouth 3 (three) times daily as needed for muscle spasms.   . diclofenac sodium (VOLTAREN) 1 % GEL Apply 2 g  topically 2 (two) times daily.  . DULoxetine (CYMBALTA) 60 MG capsule Take 60 mg by mouth daily.  . fluticasone (FLONASE) 50 MCG/ACT nasal spray Place 1 spray into both nostrils daily.   Marland Kitchen. ibuprofen (ADVIL,MOTRIN) 800 MG tablet Take 800 mg by mouth every 8 (eight) hours as needed (pain).   . Insulin Glargine (LANTUS SOLOSTAR) 100 UNIT/ML Solostar Pen Inject 36 Units into the skin daily.   . metoprolol succinate (TOPROL-XL) 50 MG 24 hr tablet Take 1 tablet (50 mg total) by mouth daily.  Marland Kitchen. MOVANTIK 25 MG TABS tablet Take 25 mg by mouth every morning.  . ondansetron (ZOFRAN) 4 MG tablet TAKE 1 TABLET BY MOUTH EVERY 8 HOURS AS NEEDED FOR NAUSEA AND VOMITING  . oxyCODONE-acetaminophen (PERCOCET) 10-325 MG per tablet Take 0.5-1 tablets by mouth every 4 (four) hours as needed for pain.  . pregabalin (LYRICA)  150 MG capsule Take 150 mg by mouth 3 (three) times daily.  . rosuvastatin (CRESTOR) 5 MG tablet TAKE 1 TABLET BY MOUTH EVERY DAY  . sacubitril-valsartan (ENTRESTO) 24-26 MG Take 1 tablet by mouth 2 (two) times daily.  Marland Kitchen torsemide (DEMADEX) 20 MG tablet Take 2 tablets (40 mg total) by mouth daily.  . VENTOLIN HFA 108 (90 BASE) MCG/ACT inhaler INHALE 2 PUFFS BY MOUTH EVERY 4 TO 6 HOURS AS NEEDED FOR FOR SHORTNESS OF BREATH OR WHEEZING (Patient taking differently: Inhale 2 puffs into the lungs. Every 4 to 6 hours as needed for wheezing or shortness of breath)  . VICTOZA 18 MG/3ML SOPN Inject 1.8 mg into the skin daily.     Allergies:   Imitrex [sumatriptan]   Social History   Tobacco Use  . Smoking status: Never Smoker  . Smokeless tobacco: Never Used  Substance Use Topics  . Alcohol use: No  . Drug use: No     Family Hx: The patient's family history includes Diabetes in her maternal aunt, paternal aunt, paternal grandfather, and paternal grandmother; Heart disease in her brother; Hypertension in her mother and paternal grandfather. There is no history of Heart attack, Stroke, Colon cancer,  Colon polyps, Esophageal cancer, Rectal cancer, Stomach cancer, or Breast cancer.  ROS:   Please see the history of present illness.     All other systems reviewed and are negative.   Labs/Other Tests and Data Reviewed:    Recent Labs: 01/17/2018: TSH 1.506 03/28/2018: Magnesium 1.9 08/02/2018: Platelets 221 09/22/2018: ALT 47 10/01/2018: Hemoglobin 12.6 12/05/2018: BUN 10; Creatinine, Ser 0.87; Potassium 3.4; Sodium 141   Recent Lipid Panel Lab Results  Component Value Date/Time   CHOL 120 09/22/2018 11:25 AM   TRIG 37 09/22/2018 11:25 AM   HDL 62 09/22/2018 11:25 AM   CHOLHDL 1.9 09/22/2018 11:25 AM   LDLCALC 51 09/22/2018 11:25 AM    Wt Readings from Last 3 Encounters:  12/19/18 187 lb (84.8 kg)  08/18/18 187 lb (84.8 kg)  08/05/18 197 lb 4.8 oz (89.5 kg)     Objective:    Vital Signs:  BP (!) 152/100   Pulse 83   Ht 5\' 5"  (1.651 m)   Wt 187 lb (84.8 kg)   BMI 31.12 kg/m    CONSTITUTIONAL:  Well nourished, well developed female in no acute distress.  EYES: anicteric MOUTH: oral mucosa is pink RESPIRATORY: Normal respiratory effort, symmetric expansion CARDIOVASCULAR: No peripheral edema SKIN: No rash, lesions or ulcers MUSCULOSKELETAL: no digital cyanosis NEURO: Cranial Nerves II-XII grossly intact, moves all extremities PSYCH: Intact judgement and insight.  A&O x 3, Mood/affect appropriate   ASSESSMENT & PLAN:    1.  OSA - the patient is tolerating PAP therapy well without any problems. The PAP download was reviewed today and showed an AHI of 2.3/hr on 9 cm H2O with 50% compliance in using more than 4 hours nightly.  The patient has been using and benefiting from PAP use and will continue to benefit from therapy.   2.  Hypertension - her BP is controlled today.  She will continue on  Entreto 24-26mg  BID and Toprol XL 50mg  daily.    3.  Obesity - I have encouraged her to get into a routine exercise program and cut back on carbs and portions. She has  fibromyalgia which limits her ability to exercise.  COVID-19 Education: The signs and symptoms of COVID-19 were discussed with the patient and how to seek care for testing (  follow up with PCP or arrange E-visit).  The importance of social distancing was discussed today.  Patient Risk:   After full review of this patient's clinical status, I feel that they are at least moderate risk at this time.  Time:   Today, I have spent 25 minutes directly with telemedicine on video discussing medical problems including OSA, HTN and obesity.  We also reviewed the symptoms of COVID 19 and the ways to protect against contracting the virus with telehealth technology.   Medication Adjustments/Labs and Tests Ordered: Current medicines are reviewed at length with the patient today.  Concerns regarding medicines are outlined above.  Tests Ordered: No orders of the defined types were placed in this encounter.  Medication Changes: No orders of the defined types were placed in this encounter.   Disposition:  Follow up in 1 year(s)  Signed, Fransico Him, MD  12/19/2018 8:27 AM     [facility]

## 2018-12-19 ENCOUNTER — Other Ambulatory Visit: Payer: Self-pay

## 2018-12-19 ENCOUNTER — Encounter: Payer: Self-pay | Admitting: Cardiology

## 2018-12-19 ENCOUNTER — Telehealth (INDEPENDENT_AMBULATORY_CARE_PROVIDER_SITE_OTHER): Payer: Medicare HMO | Admitting: Cardiology

## 2018-12-19 VITALS — BP 152/100 | HR 83 | Ht 65.0 in | Wt 187.0 lb

## 2018-12-19 DIAGNOSIS — I10 Essential (primary) hypertension: Secondary | ICD-10-CM | POA: Diagnosis not present

## 2018-12-19 DIAGNOSIS — G4733 Obstructive sleep apnea (adult) (pediatric): Secondary | ICD-10-CM

## 2018-12-19 NOTE — Patient Instructions (Signed)

## 2018-12-22 DIAGNOSIS — E559 Vitamin D deficiency, unspecified: Secondary | ICD-10-CM | POA: Diagnosis not present

## 2018-12-23 DIAGNOSIS — E669 Obesity, unspecified: Secondary | ICD-10-CM | POA: Diagnosis not present

## 2018-12-23 DIAGNOSIS — E11319 Type 2 diabetes mellitus with unspecified diabetic retinopathy without macular edema: Secondary | ICD-10-CM | POA: Diagnosis not present

## 2018-12-23 DIAGNOSIS — J4520 Mild intermittent asthma, uncomplicated: Secondary | ICD-10-CM | POA: Diagnosis not present

## 2018-12-23 DIAGNOSIS — E1140 Type 2 diabetes mellitus with diabetic neuropathy, unspecified: Secondary | ICD-10-CM | POA: Diagnosis not present

## 2018-12-23 DIAGNOSIS — E559 Vitamin D deficiency, unspecified: Secondary | ICD-10-CM | POA: Diagnosis not present

## 2018-12-23 DIAGNOSIS — F319 Bipolar disorder, unspecified: Secondary | ICD-10-CM | POA: Diagnosis not present

## 2018-12-23 DIAGNOSIS — D649 Anemia, unspecified: Secondary | ICD-10-CM | POA: Diagnosis not present

## 2018-12-23 DIAGNOSIS — I428 Other cardiomyopathies: Secondary | ICD-10-CM | POA: Diagnosis not present

## 2018-12-23 DIAGNOSIS — E1165 Type 2 diabetes mellitus with hyperglycemia: Secondary | ICD-10-CM | POA: Diagnosis not present

## 2019-01-19 DIAGNOSIS — G4733 Obstructive sleep apnea (adult) (pediatric): Secondary | ICD-10-CM | POA: Diagnosis not present

## 2019-02-07 DIAGNOSIS — G4733 Obstructive sleep apnea (adult) (pediatric): Secondary | ICD-10-CM | POA: Diagnosis not present

## 2019-02-14 ENCOUNTER — Other Ambulatory Visit: Payer: Self-pay | Admitting: Family Medicine

## 2019-02-14 ENCOUNTER — Other Ambulatory Visit: Payer: Self-pay | Admitting: Nurse Practitioner

## 2019-02-14 DIAGNOSIS — Z1231 Encounter for screening mammogram for malignant neoplasm of breast: Secondary | ICD-10-CM

## 2019-02-19 DIAGNOSIS — G4733 Obstructive sleep apnea (adult) (pediatric): Secondary | ICD-10-CM | POA: Diagnosis not present

## 2019-02-22 DIAGNOSIS — H3582 Retinal ischemia: Secondary | ICD-10-CM | POA: Diagnosis not present

## 2019-02-22 DIAGNOSIS — H2513 Age-related nuclear cataract, bilateral: Secondary | ICD-10-CM | POA: Diagnosis not present

## 2019-02-22 DIAGNOSIS — E113593 Type 2 diabetes mellitus with proliferative diabetic retinopathy without macular edema, bilateral: Secondary | ICD-10-CM | POA: Diagnosis not present

## 2019-02-23 DIAGNOSIS — M961 Postlaminectomy syndrome, not elsewhere classified: Secondary | ICD-10-CM | POA: Diagnosis not present

## 2019-02-23 DIAGNOSIS — F1120 Opioid dependence, uncomplicated: Secondary | ICD-10-CM | POA: Diagnosis not present

## 2019-02-23 DIAGNOSIS — I10 Essential (primary) hypertension: Secondary | ICD-10-CM | POA: Diagnosis not present

## 2019-02-23 DIAGNOSIS — Z6834 Body mass index (BMI) 34.0-34.9, adult: Secondary | ICD-10-CM | POA: Diagnosis not present

## 2019-03-21 DIAGNOSIS — G4733 Obstructive sleep apnea (adult) (pediatric): Secondary | ICD-10-CM | POA: Diagnosis not present

## 2019-03-28 ENCOUNTER — Other Ambulatory Visit: Payer: Self-pay | Admitting: Cardiology

## 2019-03-31 ENCOUNTER — Ambulatory Visit
Admission: RE | Admit: 2019-03-31 | Discharge: 2019-03-31 | Disposition: A | Discharge status: Home / Self Care | Payer: Medicare HMO | Source: Ambulatory Visit | Attending: Family Medicine | Admitting: Family Medicine

## 2019-03-31 ENCOUNTER — Other Ambulatory Visit: Payer: Self-pay

## 2019-03-31 DIAGNOSIS — Z1231 Encounter for screening mammogram for malignant neoplasm of breast: Secondary | ICD-10-CM

## 2019-04-21 DIAGNOSIS — G4733 Obstructive sleep apnea (adult) (pediatric): Secondary | ICD-10-CM | POA: Diagnosis not present

## 2019-04-28 DIAGNOSIS — M797 Fibromyalgia: Secondary | ICD-10-CM | POA: Diagnosis not present

## 2019-04-28 DIAGNOSIS — R5383 Other fatigue: Secondary | ICD-10-CM | POA: Diagnosis not present

## 2019-04-28 DIAGNOSIS — Z794 Long term (current) use of insulin: Secondary | ICD-10-CM | POA: Diagnosis not present

## 2019-04-28 DIAGNOSIS — I5022 Chronic systolic (congestive) heart failure: Secondary | ICD-10-CM | POA: Diagnosis not present

## 2019-04-28 DIAGNOSIS — F319 Bipolar disorder, unspecified: Secondary | ICD-10-CM | POA: Diagnosis not present

## 2019-04-28 DIAGNOSIS — E785 Hyperlipidemia, unspecified: Secondary | ICD-10-CM | POA: Diagnosis not present

## 2019-04-28 DIAGNOSIS — D509 Iron deficiency anemia, unspecified: Secondary | ICD-10-CM | POA: Diagnosis not present

## 2019-04-28 DIAGNOSIS — I10 Essential (primary) hypertension: Secondary | ICD-10-CM | POA: Diagnosis not present

## 2019-04-28 DIAGNOSIS — E1143 Type 2 diabetes mellitus with diabetic autonomic (poly)neuropathy: Secondary | ICD-10-CM | POA: Diagnosis not present

## 2019-04-28 DIAGNOSIS — I428 Other cardiomyopathies: Secondary | ICD-10-CM | POA: Diagnosis not present

## 2019-05-04 DIAGNOSIS — Z20828 Contact with and (suspected) exposure to other viral communicable diseases: Secondary | ICD-10-CM | POA: Diagnosis not present

## 2019-05-10 DIAGNOSIS — G4733 Obstructive sleep apnea (adult) (pediatric): Secondary | ICD-10-CM | POA: Diagnosis not present

## 2019-05-18 DIAGNOSIS — M961 Postlaminectomy syndrome, not elsewhere classified: Secondary | ICD-10-CM | POA: Diagnosis not present

## 2019-05-18 DIAGNOSIS — I10 Essential (primary) hypertension: Secondary | ICD-10-CM | POA: Diagnosis not present

## 2019-05-18 DIAGNOSIS — M5136 Other intervertebral disc degeneration, lumbar region: Secondary | ICD-10-CM | POA: Diagnosis not present

## 2019-05-18 DIAGNOSIS — Z6833 Body mass index (BMI) 33.0-33.9, adult: Secondary | ICD-10-CM | POA: Diagnosis not present

## 2019-05-21 DIAGNOSIS — G4733 Obstructive sleep apnea (adult) (pediatric): Secondary | ICD-10-CM | POA: Diagnosis not present

## 2019-06-13 DIAGNOSIS — E113513 Type 2 diabetes mellitus with proliferative diabetic retinopathy with macular edema, bilateral: Secondary | ICD-10-CM | POA: Diagnosis not present

## 2019-06-21 DIAGNOSIS — G4733 Obstructive sleep apnea (adult) (pediatric): Secondary | ICD-10-CM | POA: Diagnosis not present

## 2019-06-27 ENCOUNTER — Telehealth: Payer: Self-pay | Admitting: Hematology

## 2019-06-27 NOTE — Telephone Encounter (Signed)
Returned patient's phone call regarding rescheduling April 2020 appointments, per patient's request appointments has been rescheduled to 01/29.

## 2019-07-05 DIAGNOSIS — E113593 Type 2 diabetes mellitus with proliferative diabetic retinopathy without macular edema, bilateral: Secondary | ICD-10-CM | POA: Diagnosis not present

## 2019-07-05 DIAGNOSIS — H2513 Age-related nuclear cataract, bilateral: Secondary | ICD-10-CM | POA: Diagnosis not present

## 2019-07-05 DIAGNOSIS — H3582 Retinal ischemia: Secondary | ICD-10-CM | POA: Diagnosis not present

## 2019-07-10 NOTE — Progress Notes (Signed)
[facility]   Telephone:([phone]) [phone] Fax:([phone]) [phone]   Clinic Follow up Note   Patient Care Team: Hamrick, Durward Fortes, MD as PCP - General (Family Medicine) Laurey Morale, MD as PCP - Advanced Heart Failure (Cardiology) Fuller Plan, RD as Dietitian Hamrick, Durward Fortes, MD as Referring Physician (Family Medicine) Hamrick, Durward Fortes, MD (Family Medicine)  Date of Service:  07/14/2019  CHIEF COMPLAINT: F/u of Iron deficiency anemia due to chronic blood loss   CURRENT THERAPY:  Feraheme 1,020 mg given  On 10/12/13 and 03/20/2014, 510mg  08/29/14  INTERVAL HISTORY:  [name] is here for a follow up of anemia. She was last seen by me in 03/2018. She presents to the clinic alone.  She reports she has been quite fatigued lately, still able to do routine activities.  She previously had endometrial ablation for 5 years ago, and has not had menstrual period until 4-5 months ago, when her menstrual period returned, it usually lasts about 2 days, but is still heavy.  She denies any abdominal bleeding.  She does not she does not take oral iron, due to lack of efficacy.  She denies any chest discomfort, shortness of breath, or other new symptoms.  Review of system otherwise negative.  MEDICAL HISTORY:  Past Medical History:  Diagnosis Date  . Anemia    hx of iron transfusions - last one 10/2013   . Anxiety   . Arthritis    neck and shoulders   . Asthma   . Back pain, chronic   . Bipolar disorder (HCC)   . Carpal tunnel syndrome on both sides   . Cataract   . Depression   . Diabetes mellitus   . Fibromyalgia   . GERD (gastroesophageal reflux disease)   . History of gastric bypass Jan 2013  . Hyperlipidemia   . Hypertension   . Insomnia   . Migraines   . Nervous breakdown sept 2013  . Neuropathy   . Nonischemic cardiomyopathy (HCC)   . OSA (obstructive sleep apnea) 02/21/2015  . Peripheral vascular disease (HCC)   . Shortness of breath    with exertion   . Sleep  apnea    borderline- no cpap yet  . Systolic CHF, acute on chronic (HCC)    CARDIOLOGIST-- DR 04/23/2015 NELSON  ([place] Lot)  . Wound of left breast     SURGICAL HISTORY: Past Surgical History:  Procedure Laterality Date  . BACK SURGERY  November 1992  . BREAST CYST ASPIRATION Left   . BREAST SURGERY Left    I&D for abcsess   . CARDIAC CATHETERIZATION  11-14-2013  DR 01-14-2014   NORMAL LV FILLING PRESSURE W/ MILD ELEVATED RV FILLING, POSSIBLY SUFFESTING RV FAILURE OUT OF PROPORTION TO LV FAILURE. EF ABOUT 35% WITH DIFFUSE HYPOKINESIS. NO ANGIOGRAPHIC CAD, NONISCHEMIC CARDIOMYOPATHY  . CESAREAN SECTION  December 1998  . CHOLECYSTECTOMY  1998  . DILITATION & CURRETTAGE/HYSTROSCOPY WITH NOVASURE ABLATION N/A 02/26/2014   Procedure: DILATATION & CURETTAGE/HYSTEROSCOPY WITH NOVASURE ABLATION;  Surgeon: 02/28/2014, MD;  Location: [facility];  Service: Gynecology;  Laterality: N/A;  . ESOPHAGOGASTRODUODENOSCOPY  07/27/2011   Procedure: ESOPHAGOGASTRODUODENOSCOPY (EGD);  Surgeon: 09/24/2011, MD;  [facility];  Service: Endoscopy;  Laterality: N/A;  . GASTRIC ROUX-EN-Y  06/30/2011   Procedure: LAPAROSCOPIC ROUX-EN-Y GASTRIC;  Surgeon: 07/02/2011, MD;  Location: [facility];  Service: General;  Laterality: N/A;     . INCISION AND DRAINAGE OF WOUND Left 12/14/2013  Procedure: IRRIGATION AND DEBRIDEMENT OF LEFT BREAST WOUND WITH PLACEMENT OF A-CELL, WITH CLOSURE ;  Surgeon: Wayland Denis, DO;  Location: [facility];  Service: Plastics;  Laterality: Left;  . IRRIGATION AND DEBRIDEMENT ABSCESS Left 11/26/2013   Procedure: IRRIGATION AND DEBRIDEMENT ABSCESS;  Surgeon: Velora Heckler, MD;  Location: [facility];  Service: General;  Laterality: Left;  . LEFT AND RIGHT HEART CATHETERIZATION WITH CORONARY ANGIOGRAM N/A 11/14/2013   Procedure: LEFT AND RIGHT HEART CATHETERIZATION WITH CORONARY ANGIOGRAM;  Surgeon: Laurey Morale, MD;  Location: [facility];  Service: Cardiovascular;  Laterality: N/A;   . SPINAL CORD STIMULATOR INSERTION N/A 03/09/2014   Procedure: LUMBAR SPINAL CORD STIMULATOR INSERTION;  Surgeon: Gwynne Edinger, MD;  Location: [facility];  Service: Neurosurgery;  Laterality: N/A;  . TUBAL LIGATION  January 1999    I have reviewed the social history and family history with the patient and they are unchanged from previous note.  ALLERGIES:  is allergic to imitrex [sumatriptan].  MEDICATIONS:  Current Outpatient Medications  Medication Sig Dispense Refill  . Biotin 5000 MCG TABS Take 5,000 mcg by mouth daily.    . Cholecalciferol (VITAMIN D3) 5000 units TABS Take 5,000 Units by mouth daily.     . cyclobenzaprine (FLEXERIL) 10 MG tablet Take 10 mg by mouth 3 (three) times daily as needed for muscle spasms.     . diclofenac sodium (VOLTAREN) 1 % GEL Apply 2 g topically 2 (two) times daily.    . DULoxetine (CYMBALTA) 60 MG capsule Take 60 mg by mouth daily.    . fluticasone (FLONASE) 50 MCG/ACT nasal spray Place 1 spray into both nostrils daily.     Marland Kitchen ibuprofen (ADVIL,MOTRIN) 800 MG tablet Take 800 mg by mouth every 8 (eight) hours as needed (pain).     . Insulin Glargine (LANTUS SOLOSTAR) 100 UNIT/ML Solostar Pen Inject 36 Units into the skin daily.     . metoprolol succinate (TOPROL-XL) 50 MG 24 hr tablet TAKE 1 TABLET BY MOUTH EVERY DAY 90 tablet 1  . MOVANTIK 25 MG TABS tablet Take 25 mg by mouth every morning.    . ondansetron (ZOFRAN) 4 MG tablet TAKE 1 TABLET BY MOUTH EVERY 8 HOURS AS NEEDED FOR NAUSEA AND VOMITING 20 tablet 0  . oxyCODONE-acetaminophen (PERCOCET) 10-325 MG per tablet Take 0.5-1 tablets by mouth every 4 (four) hours as needed for pain.    . pregabalin (LYRICA) 150 MG capsule Take 150 mg by mouth 3 (three) times daily.    . rosuvastatin (CRESTOR) 5 MG tablet TAKE 1 TABLET BY MOUTH EVERY DAY 90 tablet 3  . sacubitril-valsartan (ENTRESTO) 24-26 MG Take 1 tablet by mouth 2 (two) times daily. 180 tablet 3  . VENTOLIN HFA 108 (90 BASE) MCG/ACT inhaler  INHALE 2 PUFFS BY MOUTH EVERY 4 TO 6 HOURS AS NEEDED FOR FOR SHORTNESS OF BREATH OR WHEEZING (Patient taking differently: Inhale 2 puffs into the lungs. Every 4 to 6 hours as needed for wheezing or shortness of breath) 8.5 Inhaler 3  . VICTOZA 18 MG/3ML SOPN Inject 1.8 mg into the skin daily.    Marland Kitchen torsemide (DEMADEX) 20 MG tablet Take 2 tablets (40 mg total) by mouth daily. 180 tablet 3   No current facility-administered medications for this visit.    PHYSICAL EXAMINATION: ECOG PERFORMANCE STATUS: 1 - Symptomatic but completely ambulatory  Vitals:   07/14/19 0808  BP: 136/85  Pulse: 80  Resp: 20  Temp: 99.1 F (37.3 C)  SpO2: 100%   Filed Weights   07/14/19 0808  Weight: 202 lb 8 oz (91.9 kg)   GENERAL:alert, no distress and comfortable SKIN: skin color, texture, turgor are normal, no rashes or significant lesions EYES: normal, Conjunctiva are pink and non-injected, sclera clear Musculoskeletal:no cyanosis of digits and no clubbing  NEURO: alert & oriented x 3 with fluent speech, no focal motor/sensory deficits  LABORATORY DATA:  I have reviewed the data as listed CBC Latest Ref Rng & Units 07/14/2019 10/01/2018 08/02/2018  WBC 4.0 - 10.5 K/uL 8.4 - 8.8  Hemoglobin 12.0 - 15.0 g/dL 12.0 12.6 11.7(L)  Hematocrit 36.0 - 46.0 % 37.0 37.0 37.9  Platelets 150 - 400 K/uL 278 - 221     CMP Latest Ref Rng & Units 12/05/2018 10/01/2018 09/22/2018  Glucose 70 - 99 mg/dL 157(H) - 175(H)  BUN 6 - 20 mg/dL 10 - 11  Creatinine 0.44 - 1.00 mg/dL 0.87 0.80 0.76  Sodium 135 - 145 mmol/L 141 139 139  Potassium 3.5 - 5.1 mmol/L 3.4(L) 3.3(L) 4.1  Chloride 98 - 111 mmol/L 109 - 106  CO2 22 - 32 mmol/L 23 - 24  Calcium 8.9 - 10.3 mg/dL 8.5(L) - 8.8(L)  Total Protein 6.5 - 8.1 g/dL - - 6.5  Total Bilirubin 0.3 - 1.2 mg/dL - - 0.6  Alkaline Phos 38 - 126 U/L - - 133(H)  AST 15 - 41 U/L - - 27  ALT 0 - 44 U/L - - 47(H)      RADIOGRAPHIC STUDIES: I have personally reviewed the radiological  images as listed and agreed with the findings in the report. No results found.   ASSESSMENT & PLAN:  [name] is a 49 y.o. female with   1. H.o. severe IDA secondary to gastric bypass and menorrhagia   -Previously, her lab was consistent with iron deficiency, secondary to gastric bypass and menorrhagia. -She has had endometrial ablation in August 2015, no menstrual period since then until mid 2019. Her period has restarted about 5-6 months ago, but it only lasts about two days. -She did not respond well to oral iron, and does not want try it anymore. -Received IV feraheme 10/12/2013, 03/20/2014, 08/29/2014. She responded well. Has not needed since then.  -Labs reviewed, CBC is WNLs. Iron studies pending.  -If her ferritin less than 50, will set up IV Feraheme -She will check her CBC and iron studies with her primary care physician every 3 to 4 months, with her routine labs -Follow-up with me in 1 year  2. Fatigue and drowsiness  -Her TSH and sleep study wall normal. -If her oral level was low, we'll replaced with IV iron, which may also contribute to her fatigue -She will continue follow-up with her primary care physician  3. DM2, CHF and nonischemic CHF  -Continue management per PCP, her endocrinologist and cardiologist      PLAN:  -iv feraheme if ferritin <50, her ferritin is 19 today, will set up one dose next week  -f/u in one year -she will check her CBC and ferritin with her PCP every 3-4 months with her routine lab -will check VitB12 level today also    No problem-specific Assessment & Plan notes found for this encounter.   Orders Placed This Encounter  Procedures  . Vitamin B12    Add to lab draw this morning    Standing Status:   Future    Standing Expiration Date:   07/13/2020   All questions were  answered. The patient knows to call the clinic with any problems, questions or concerns. No barriers to learning was detected. The total time spent in the  appointment was 20 minutes.     Malachy Mood, MD 07/14/2019   I, Delphina Cahill, am acting as scribe for Malachy Mood, MD.   I have reviewed the above documentation for accuracy and completeness, and I agree with the above.

## 2019-07-11 ENCOUNTER — Telehealth (HOSPITAL_COMMUNITY): Payer: Self-pay

## 2019-07-11 NOTE — Telephone Encounter (Signed)
PT contacted [facility] and left a v/m stating she wanted to schedule and appt.  I attempted to contact the patient and received her voicemail, left a voicemail advising her of her next upcoming appt on 2/22 @ 8:40 am and if she needed to be seen sooner to contact our office.

## 2019-07-14 ENCOUNTER — Inpatient Hospital Stay (HOSPITAL_BASED_OUTPATIENT_CLINIC_OR_DEPARTMENT_OTHER): Payer: Medicare HMO | Admitting: Hematology

## 2019-07-14 ENCOUNTER — Encounter: Payer: Self-pay | Admitting: Hematology

## 2019-07-14 ENCOUNTER — Inpatient Hospital Stay: Payer: Medicare HMO | Attending: Hematology

## 2019-07-14 ENCOUNTER — Other Ambulatory Visit: Payer: Self-pay

## 2019-07-14 VITALS — BP 136/85 | HR 80 | Temp 99.1°F | Resp 20 | Ht 65.0 in | Wt 202.5 lb

## 2019-07-14 DIAGNOSIS — R5383 Other fatigue: Secondary | ICD-10-CM | POA: Diagnosis not present

## 2019-07-14 DIAGNOSIS — R400 Somnolence: Secondary | ICD-10-CM | POA: Insufficient documentation

## 2019-07-14 DIAGNOSIS — E119 Type 2 diabetes mellitus without complications: Secondary | ICD-10-CM | POA: Diagnosis not present

## 2019-07-14 DIAGNOSIS — I509 Heart failure, unspecified: Secondary | ICD-10-CM | POA: Diagnosis not present

## 2019-07-14 DIAGNOSIS — Z9884 Bariatric surgery status: Secondary | ICD-10-CM

## 2019-07-14 DIAGNOSIS — D500 Iron deficiency anemia secondary to blood loss (chronic): Secondary | ICD-10-CM | POA: Insufficient documentation

## 2019-07-14 DIAGNOSIS — N920 Excessive and frequent menstruation with regular cycle: Secondary | ICD-10-CM | POA: Diagnosis not present

## 2019-07-14 LAB — CBC WITH DIFFERENTIAL/PLATELET
Abs Immature Granulocytes: 0.02 10*3/uL (ref 0.00–0.07)
Basophils Absolute: 0.1 10*3/uL (ref 0.0–0.1)
Basophils Relative: 1 %
Eosinophils Absolute: 0.4 10*3/uL (ref 0.0–0.5)
Eosinophils Relative: 5 %
HCT: 37 % (ref 36.0–46.0)
Hemoglobin: 12 g/dL (ref 12.0–15.0)
Immature Granulocytes: 0 %
Lymphocytes Relative: 29 %
Lymphs Abs: 2.4 10*3/uL (ref 0.7–4.0)
MCH: 29.3 pg (ref 26.0–34.0)
MCHC: 32.4 g/dL (ref 30.0–36.0)
MCV: 90.5 fL (ref 80.0–100.0)
Monocytes Absolute: 0.5 10*3/uL (ref 0.1–1.0)
Monocytes Relative: 6 %
Neutro Abs: 4.9 10*3/uL (ref 1.7–7.7)
Neutrophils Relative %: 59 %
Platelets: 278 10*3/uL (ref 150–400)
RBC: 4.09 MIL/uL (ref 3.87–5.11)
RDW: 13.3 % (ref 11.5–15.5)
WBC: 8.4 10*3/uL (ref 4.0–10.5)
nRBC: 0 % (ref 0.0–0.2)

## 2019-07-14 LAB — IRON AND TIBC
Iron: 48 ug/dL (ref 41–142)
Saturation Ratios: 15 % — ABNORMAL LOW (ref 21–57)
TIBC: 315 ug/dL (ref 236–444)
UIBC: 267 ug/dL (ref 120–384)

## 2019-07-14 LAB — FERRITIN: Ferritin: 19 ng/mL (ref 11–307)

## 2019-07-14 LAB — VITAMIN B12: Vitamin B-12: 263 pg/mL (ref 180–914)

## 2019-07-17 ENCOUNTER — Telehealth: Payer: Self-pay

## 2019-07-17 NOTE — Telephone Encounter (Signed)
Mychart message sent.

## 2019-07-19 ENCOUNTER — Other Ambulatory Visit: Payer: Self-pay | Admitting: Hematology

## 2019-07-21 ENCOUNTER — Other Ambulatory Visit: Payer: Self-pay

## 2019-07-21 ENCOUNTER — Inpatient Hospital Stay: Payer: Medicare HMO | Attending: Hematology

## 2019-07-21 VITALS — BP 132/80 | HR 84 | Temp 98.3°F | Resp 16

## 2019-07-21 DIAGNOSIS — Z9884 Bariatric surgery status: Secondary | ICD-10-CM | POA: Diagnosis not present

## 2019-07-21 DIAGNOSIS — N920 Excessive and frequent menstruation with regular cycle: Secondary | ICD-10-CM | POA: Insufficient documentation

## 2019-07-21 DIAGNOSIS — D500 Iron deficiency anemia secondary to blood loss (chronic): Secondary | ICD-10-CM

## 2019-07-21 MED ORDER — SODIUM CHLORIDE 0.9 % IV SOLN
Freq: Once | INTRAVENOUS | Status: AC
  Filled 2019-07-21: qty 250

## 2019-07-21 MED ORDER — LORATADINE 10 MG PO TABS
10.0000 mg | ORAL_TABLET | Freq: Once | ORAL | Status: AC
  Administered 2019-07-21: 10:00:00 10 mg via ORAL

## 2019-07-21 MED ORDER — LORATADINE 10 MG PO TABS
ORAL_TABLET | ORAL | Status: AC
  Filled 2019-07-21: qty 1

## 2019-07-21 MED ORDER — SODIUM CHLORIDE 0.9 % IV SOLN
510.0000 mg | Freq: Once | INTRAVENOUS | Status: AC
  Administered 2019-07-21: 11:00:00 510 mg via INTRAVENOUS
  Filled 2019-07-21: qty 510

## 2019-07-21 NOTE — Patient Instructions (Signed)
Ferumoxytol injection What is this medicine? FERUMOXYTOL is an iron complex. Iron is used to make healthy red blood cells, which carry oxygen and nutrients throughout the body. This medicine is used to treat iron deficiency anemia. This medicine may be used for other purposes; ask your health care provider or pharmacist if you have questions. COMMON BRAND NAME(S): Feraheme What should I tell my health care provider before I take this medicine? They need to know if you have any of these conditions:  anemia not caused by low iron levels  high levels of iron in the blood  magnetic resonance imaging (MRI) test scheduled  an unusual or allergic reaction to iron, other medicines, foods, dyes, or preservatives  pregnant or trying to get pregnant  breast-feeding How should I use this medicine? This medicine is for injection into a vein. It is given by a health care professional in a hospital or clinic setting. Talk to your pediatrician regarding the use of this medicine in children. Special care may be needed. Overdosage: If you think you have taken too much of this medicine contact a poison control center or emergency room at once. NOTE: This medicine is only for you. Do not share this medicine with others. What if I miss a dose? It is important not to miss your dose. Call your doctor or health care professional if you are unable to keep an appointment. What may interact with this medicine? This medicine may interact with the following medications:  other iron products This list may not describe all possible interactions. Give your health care provider a list of all the medicines, herbs, non-prescription drugs, or dietary supplements you use. Also tell them if you smoke, drink alcohol, or use illegal drugs. Some items may interact with your medicine. What should I watch for while using this medicine? Visit your doctor or healthcare professional regularly. Tell your doctor or healthcare  professional if your symptoms do not start to get better or if they get worse. You may need blood work done while you are taking this medicine. You may need to follow a special diet. Talk to your doctor. Foods that contain iron include: whole grains/cereals, dried fruits, beans, or peas, leafy green vegetables, and organ meats (liver, kidney). What side effects may I notice from receiving this medicine? Side effects that you should report to your doctor or health care professional as soon as possible:  allergic reactions like skin rash, itching or hives, swelling of the face, lips, or tongue  breathing problems  changes in blood pressure  feeling faint or lightheaded, falls  fever or chills  flushing, sweating, or hot feelings  swelling of the ankles or feet Side effects that usually do not require medical attention (report to your doctor or health care professional if they continue or are bothersome):  diarrhea  headache  nausea, vomiting  stomach pain This list may not describe all possible side effects. Call your doctor for medical advice about side effects. You may report side effects to FDA at 1-800-FDA-1088. Where should I keep my medicine? This drug is given in a hospital or clinic and will not be stored at home. NOTE: This sheet is a summary. It may not cover all possible information. If you have questions about this medicine, talk to your doctor, pharmacist, or health care provider.  2020 Elsevier/Gold Standard (2016-07-20 20:21:10) Coronavirus (COVID-19) Are you at risk?  Are you at risk for the Coronavirus (COVID-19)?  To be considered HIGH RISK for Coronavirus (COVID-19),   you have to meet the following criteria:  . Traveled to [place], [place], [place], [place] or [place]; or in the [place] to [place], [place], [place], or [place]; and have fever, cough, and shortness of breath within the last 2 weeks of travel OR . Been in close contact with a person  diagnosed with COVID-19 within the last 2 weeks and have fever, cough, and shortness of breath . IF YOU DO NOT MEET THESE CRITERIA, YOU ARE CONSIDERED LOW RISK FOR COVID-19.  What to do if you are HIGH RISK for COVID-19?  . If you are having a medical emergency, call 911. . Seek medical care right away. Before you go to a doctor's office, urgent care or emergency department, call ahead and tell them about your recent travel, contact with someone diagnosed with COVID-19, and your symptoms. You should receive instructions from your physician's office regarding next steps of care.  . When you arrive at healthcare provider, tell the healthcare staff immediately you have returned from visiting [place], [place], [place], [place] or [place]; or traveled in the [place] to [place], [place], [place], or [place]; in the last two weeks or you have been in close contact with a person diagnosed with COVID-19 in the last 2 weeks.   . Tell the health care staff about your symptoms: fever, cough and shortness of breath. . After you have been seen by a medical provider, you will be either: o Tested for (COVID-19) and discharged home on quarantine except to seek medical care if symptoms worsen, and asked to  - Stay home and avoid contact with others until you get your results (4-5 days)  - Avoid travel on public transportation if possible (such as bus, train, or airplane) or o Sent to the Emergency Department by EMS for evaluation, COVID-19 testing, and possible admission depending on your condition and test results.  What to do if you are LOW RISK for COVID-19?  Reduce your risk of any infection by using the same precautions used for avoiding the common cold or flu:  . Wash your hands often with soap and warm water for at least 20 seconds.  If soap and water are not readily available, use an alcohol-based hand sanitizer with at least 60% alcohol.  . If coughing or sneezing, cover your mouth and nose by  coughing or sneezing into the elbow areas of your shirt or coat, into a tissue or into your sleeve (not your hands). . Avoid shaking hands with others and consider head nods or verbal greetings only. . Avoid touching your eyes, nose, or mouth with unwashed hands.  . Avoid close contact with people who are sick. . Avoid places or events with large numbers of people in one location, like concerts or sporting events. . Carefully consider travel plans you have or are making. . If you are planning any travel outside or inside the [place], visit the CDC's Travelers' Health webpage for the latest health notices. . If you have some symptoms but not all symptoms, continue to monitor at home and seek medical attention if your symptoms worsen. . If you are having a medical emergency, call 911.   ADDITIONAL HEALTHCARE OPTIONS FOR PATIENTS  [facility] / e-Visit: [email or web address]         [facility]: [phone]  [facility]: [phone]                   [facility]: [phone]   

## 2019-07-22 DIAGNOSIS — G4733 Obstructive sleep apnea (adult) (pediatric): Secondary | ICD-10-CM | POA: Diagnosis not present

## 2019-07-28 DIAGNOSIS — H2513 Age-related nuclear cataract, bilateral: Secondary | ICD-10-CM | POA: Diagnosis not present

## 2019-07-28 DIAGNOSIS — H25043 Posterior subcapsular polar age-related cataract, bilateral: Secondary | ICD-10-CM | POA: Diagnosis not present

## 2019-07-28 DIAGNOSIS — E113593 Type 2 diabetes mellitus with proliferative diabetic retinopathy without macular edema, bilateral: Secondary | ICD-10-CM | POA: Diagnosis not present

## 2019-07-28 DIAGNOSIS — H18413 Arcus senilis, bilateral: Secondary | ICD-10-CM | POA: Diagnosis not present

## 2019-07-28 DIAGNOSIS — H2511 Age-related nuclear cataract, right eye: Secondary | ICD-10-CM | POA: Diagnosis not present

## 2019-07-28 DIAGNOSIS — H25013 Cortical age-related cataract, bilateral: Secondary | ICD-10-CM | POA: Diagnosis not present

## 2019-08-04 DIAGNOSIS — H2512 Age-related nuclear cataract, left eye: Secondary | ICD-10-CM | POA: Diagnosis not present

## 2019-08-04 DIAGNOSIS — H2511 Age-related nuclear cataract, right eye: Secondary | ICD-10-CM | POA: Diagnosis not present

## 2019-08-06 DIAGNOSIS — H2511 Age-related nuclear cataract, right eye: Secondary | ICD-10-CM | POA: Diagnosis not present

## 2019-08-06 DIAGNOSIS — H25043 Posterior subcapsular polar age-related cataract, bilateral: Secondary | ICD-10-CM | POA: Diagnosis not present

## 2019-08-06 DIAGNOSIS — H25013 Cortical age-related cataract, bilateral: Secondary | ICD-10-CM | POA: Diagnosis not present

## 2019-08-06 DIAGNOSIS — H2513 Age-related nuclear cataract, bilateral: Secondary | ICD-10-CM | POA: Diagnosis not present

## 2019-08-06 DIAGNOSIS — H18413 Arcus senilis, bilateral: Secondary | ICD-10-CM | POA: Diagnosis not present

## 2019-08-06 DIAGNOSIS — E113593 Type 2 diabetes mellitus with proliferative diabetic retinopathy without macular edema, bilateral: Secondary | ICD-10-CM | POA: Diagnosis not present

## 2019-08-07 ENCOUNTER — Encounter (HOSPITAL_COMMUNITY): Payer: Self-pay | Admitting: Cardiology

## 2019-08-07 ENCOUNTER — Ambulatory Visit (HOSPITAL_COMMUNITY)
Admission: RE | Admit: 2019-08-07 | Discharge: 2019-08-07 | Disposition: A | Discharge status: Home / Self Care | Payer: Medicare HMO | Source: Ambulatory Visit | Attending: Cardiology | Admitting: Cardiology

## 2019-08-07 ENCOUNTER — Other Ambulatory Visit: Payer: Self-pay

## 2019-08-07 VITALS — BP 132/72 | HR 88 | Wt 204.0 lb

## 2019-08-07 DIAGNOSIS — Z79899 Other long term (current) drug therapy: Secondary | ICD-10-CM | POA: Insufficient documentation

## 2019-08-07 DIAGNOSIS — R10811 Right upper quadrant abdominal tenderness: Secondary | ICD-10-CM | POA: Insufficient documentation

## 2019-08-07 DIAGNOSIS — I5022 Chronic systolic (congestive) heart failure: Secondary | ICD-10-CM | POA: Insufficient documentation

## 2019-08-07 DIAGNOSIS — F419 Anxiety disorder, unspecified: Secondary | ICD-10-CM | POA: Insufficient documentation

## 2019-08-07 DIAGNOSIS — G4733 Obstructive sleep apnea (adult) (pediatric): Secondary | ICD-10-CM | POA: Diagnosis not present

## 2019-08-07 DIAGNOSIS — M797 Fibromyalgia: Secondary | ICD-10-CM | POA: Diagnosis not present

## 2019-08-07 DIAGNOSIS — Z794 Long term (current) use of insulin: Secondary | ICD-10-CM | POA: Diagnosis not present

## 2019-08-07 DIAGNOSIS — Z9884 Bariatric surgery status: Secondary | ICD-10-CM | POA: Insufficient documentation

## 2019-08-07 DIAGNOSIS — F319 Bipolar disorder, unspecified: Secondary | ICD-10-CM | POA: Insufficient documentation

## 2019-08-07 DIAGNOSIS — Z6833 Body mass index (BMI) 33.0-33.9, adult: Secondary | ICD-10-CM | POA: Diagnosis not present

## 2019-08-07 DIAGNOSIS — I428 Other cardiomyopathies: Secondary | ICD-10-CM | POA: Diagnosis not present

## 2019-08-07 DIAGNOSIS — K760 Fatty (change of) liver, not elsewhere classified: Secondary | ICD-10-CM | POA: Diagnosis not present

## 2019-08-07 DIAGNOSIS — E119 Type 2 diabetes mellitus without complications: Secondary | ICD-10-CM | POA: Diagnosis not present

## 2019-08-07 DIAGNOSIS — E785 Hyperlipidemia, unspecified: Secondary | ICD-10-CM | POA: Insufficient documentation

## 2019-08-07 DIAGNOSIS — Z8249 Family history of ischemic heart disease and other diseases of the circulatory system: Secondary | ICD-10-CM | POA: Insufficient documentation

## 2019-08-07 DIAGNOSIS — I110 Hypertensive heart disease with heart failure: Secondary | ICD-10-CM | POA: Diagnosis not present

## 2019-08-07 DIAGNOSIS — E669 Obesity, unspecified: Secondary | ICD-10-CM | POA: Insufficient documentation

## 2019-08-07 DIAGNOSIS — I2510 Atherosclerotic heart disease of native coronary artery without angina pectoris: Secondary | ICD-10-CM | POA: Insufficient documentation

## 2019-08-07 LAB — BASIC METABOLIC PANEL
Anion gap: 10 (ref 5–15)
BUN: 18 mg/dL (ref 6–20)
CO2: 19 mmol/L — ABNORMAL LOW (ref 22–32)
Calcium: 8.6 mg/dL — ABNORMAL LOW (ref 8.9–10.3)
Chloride: 108 mmol/L (ref 98–111)
Creatinine, Ser: 0.99 mg/dL (ref 0.44–1.00)
GFR calc Af Amer: >60 mL/min (ref >60)
GFR calc non Af Amer: >60 mL/min (ref >60)
Glucose, Bld: 260 mg/dL — ABNORMAL HIGH (ref 70–99)
Potassium: 3.7 mmol/L (ref 3.5–5.1)
Sodium: 137 mmol/L (ref 135–145)

## 2019-08-07 LAB — LIPID PANEL
Cholesterol: 115 mg/dL (ref 0–200)
HDL: 42 mg/dL (ref >40)
LDL Cholesterol: 58 mg/dL (ref 0–99)
Total CHOL/HDL Ratio: 2.7 RATIO
Triglycerides: 74 mg/dL (ref <150)
VLDL: 15 mg/dL (ref 0–40)

## 2019-08-07 NOTE — Patient Instructions (Signed)
Your physician has requested that you have an echocardiogram. Echocardiography is a painless test that uses sound waves to create images of your heart. It provides your doctor with information about the size and shape of your heart and how well your heart's chambers and valves are working. This procedure takes approximately one hour. There are no restrictions for this procedure.  Labs today We will only contact you if something comes back abnormal or we need to make some changes. Otherwise no news is good news!  Your physician recommends that you schedule a follow-up appointment in: 6 months with Dr Shirlee Latch.  We will call you to schedule this appointment.   Please call office at [phone] option [phone] if you have any questions or concerns.   At the [facility], you and your health needs are our priority. As part of our continuing mission to provide you with exceptional heart care, we have created designated Provider Care Teams. These Care Teams include your primary Cardiologist (physician) and Advanced Practice Providers (APPs- Physician Assistants and Nurse Practitioners) who all work together to provide you with the care you need, when you need it.   You may see any of the following providers on your designated Care Team at your next follow up: Marland Kitchen Dr Arvilla Meres . Dr Marca Ancona . Tonye Becket, NP . Robbie Lis, PA . Karle Plumber, PharmD   Please be sure to bring in all your medications bottles to every appointment.
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
## 2019-08-11 DIAGNOSIS — Z6832 Body mass index (BMI) 32.0-32.9, adult: Secondary | ICD-10-CM | POA: Diagnosis not present

## 2019-08-11 DIAGNOSIS — I10 Essential (primary) hypertension: Secondary | ICD-10-CM | POA: Diagnosis not present

## 2019-08-11 DIAGNOSIS — F319 Bipolar disorder, unspecified: Secondary | ICD-10-CM | POA: Diagnosis not present

## 2019-08-11 DIAGNOSIS — E1143 Type 2 diabetes mellitus with diabetic autonomic (poly)neuropathy: Secondary | ICD-10-CM | POA: Diagnosis not present

## 2019-08-11 DIAGNOSIS — Z794 Long term (current) use of insulin: Secondary | ICD-10-CM | POA: Diagnosis not present

## 2019-08-11 DIAGNOSIS — E785 Hyperlipidemia, unspecified: Secondary | ICD-10-CM | POA: Diagnosis not present

## 2019-08-11 DIAGNOSIS — I428 Other cardiomyopathies: Secondary | ICD-10-CM | POA: Diagnosis not present

## 2019-08-11 DIAGNOSIS — E11319 Type 2 diabetes mellitus with unspecified diabetic retinopathy without macular edema: Secondary | ICD-10-CM | POA: Diagnosis not present

## 2019-08-11 DIAGNOSIS — D509 Iron deficiency anemia, unspecified: Secondary | ICD-10-CM | POA: Diagnosis not present

## 2019-08-11 DIAGNOSIS — I509 Heart failure, unspecified: Secondary | ICD-10-CM | POA: Diagnosis not present

## 2019-08-16 DIAGNOSIS — Z9689 Presence of other specified functional implants: Secondary | ICD-10-CM | POA: Diagnosis not present

## 2019-08-16 DIAGNOSIS — Z6832 Body mass index (BMI) 32.0-32.9, adult: Secondary | ICD-10-CM | POA: Diagnosis not present

## 2019-08-16 DIAGNOSIS — M5136 Other intervertebral disc degeneration, lumbar region: Secondary | ICD-10-CM | POA: Diagnosis not present

## 2019-08-16 DIAGNOSIS — M5416 Radiculopathy, lumbar region: Secondary | ICD-10-CM | POA: Diagnosis not present

## 2019-08-18 ENCOUNTER — Other Ambulatory Visit: Payer: Self-pay

## 2019-08-18 ENCOUNTER — Ambulatory Visit (HOSPITAL_COMMUNITY)
Admission: RE | Admit: 2019-08-18 | Discharge: 2019-08-18 | Disposition: A | Discharge status: Home / Self Care | Payer: Medicare HMO | Source: Ambulatory Visit | Attending: Internal Medicine | Admitting: Internal Medicine

## 2019-08-18 DIAGNOSIS — I509 Heart failure, unspecified: Secondary | ICD-10-CM | POA: Insufficient documentation

## 2019-08-18 DIAGNOSIS — I5022 Chronic systolic (congestive) heart failure: Secondary | ICD-10-CM

## 2019-08-18 DIAGNOSIS — E785 Hyperlipidemia, unspecified: Secondary | ICD-10-CM | POA: Diagnosis not present

## 2019-08-18 DIAGNOSIS — I110 Hypertensive heart disease with heart failure: Secondary | ICD-10-CM | POA: Insufficient documentation

## 2019-08-18 DIAGNOSIS — E119 Type 2 diabetes mellitus without complications: Secondary | ICD-10-CM | POA: Diagnosis not present

## 2019-08-19 DIAGNOSIS — G4733 Obstructive sleep apnea (adult) (pediatric): Secondary | ICD-10-CM | POA: Diagnosis not present

## 2019-08-24 ENCOUNTER — Encounter (HOSPITAL_COMMUNITY): Payer: Self-pay

## 2019-09-01 DIAGNOSIS — H2512 Age-related nuclear cataract, left eye: Secondary | ICD-10-CM | POA: Diagnosis not present

## 2019-09-19 DIAGNOSIS — G4733 Obstructive sleep apnea (adult) (pediatric): Secondary | ICD-10-CM | POA: Diagnosis not present

## 2019-09-23 ENCOUNTER — Other Ambulatory Visit (HOSPITAL_COMMUNITY): Payer: Self-pay | Admitting: Cardiology

## 2019-09-25 ENCOUNTER — Other Ambulatory Visit: Payer: Self-pay | Admitting: Cardiology

## 2019-10-13 DIAGNOSIS — H52209 Unspecified astigmatism, unspecified eye: Secondary | ICD-10-CM | POA: Diagnosis not present

## 2019-10-13 DIAGNOSIS — H524 Presbyopia: Secondary | ICD-10-CM | POA: Diagnosis not present

## 2019-10-13 DIAGNOSIS — H5213 Myopia, bilateral: Secondary | ICD-10-CM | POA: Diagnosis not present

## 2019-11-06 DIAGNOSIS — M5416 Radiculopathy, lumbar region: Secondary | ICD-10-CM | POA: Diagnosis not present

## 2019-11-06 DIAGNOSIS — M5136 Other intervertebral disc degeneration, lumbar region: Secondary | ICD-10-CM | POA: Diagnosis not present

## 2019-11-06 DIAGNOSIS — Z9689 Presence of other specified functional implants: Secondary | ICD-10-CM | POA: Diagnosis not present

## 2019-11-20 DIAGNOSIS — G8929 Other chronic pain: Secondary | ICD-10-CM | POA: Diagnosis not present

## 2019-11-20 DIAGNOSIS — D509 Iron deficiency anemia, unspecified: Secondary | ICD-10-CM | POA: Diagnosis not present

## 2019-11-20 DIAGNOSIS — E1129 Type 2 diabetes mellitus with other diabetic kidney complication: Secondary | ICD-10-CM | POA: Diagnosis not present

## 2019-11-20 DIAGNOSIS — Z794 Long term (current) use of insulin: Secondary | ICD-10-CM | POA: Diagnosis not present

## 2019-11-20 DIAGNOSIS — M549 Dorsalgia, unspecified: Secondary | ICD-10-CM | POA: Diagnosis not present

## 2019-11-20 DIAGNOSIS — R809 Proteinuria, unspecified: Secondary | ICD-10-CM | POA: Diagnosis not present

## 2019-11-27 ENCOUNTER — Telehealth: Payer: Self-pay

## 2019-11-27 NOTE — Telephone Encounter (Signed)
Left vm stating labs are good and no need for infusion.  She should continue getting labs every 3-4 months with her PCP.

## 2019-12-10 ENCOUNTER — Other Ambulatory Visit (HOSPITAL_COMMUNITY): Payer: Self-pay | Admitting: Cardiology

## 2020-01-31 DIAGNOSIS — M5136 Other intervertebral disc degeneration, lumbar region: Secondary | ICD-10-CM | POA: Diagnosis not present

## 2020-01-31 DIAGNOSIS — R030 Elevated blood-pressure reading, without diagnosis of hypertension: Secondary | ICD-10-CM | POA: Insufficient documentation

## 2020-01-31 DIAGNOSIS — M961 Postlaminectomy syndrome, not elsewhere classified: Secondary | ICD-10-CM | POA: Insufficient documentation

## 2020-01-31 DIAGNOSIS — Z9689 Presence of other specified functional implants: Secondary | ICD-10-CM | POA: Diagnosis not present

## 2020-02-14 DIAGNOSIS — G4733 Obstructive sleep apnea (adult) (pediatric): Secondary | ICD-10-CM | POA: Diagnosis not present

## 2020-02-28 ENCOUNTER — Other Ambulatory Visit: Payer: Self-pay | Admitting: Family Medicine

## 2020-02-28 DIAGNOSIS — Z1231 Encounter for screening mammogram for malignant neoplasm of breast: Secondary | ICD-10-CM

## 2020-03-01 DIAGNOSIS — E785 Hyperlipidemia, unspecified: Secondary | ICD-10-CM | POA: Diagnosis not present

## 2020-03-01 DIAGNOSIS — Z794 Long term (current) use of insulin: Secondary | ICD-10-CM | POA: Diagnosis not present

## 2020-03-01 DIAGNOSIS — R809 Proteinuria, unspecified: Secondary | ICD-10-CM | POA: Diagnosis not present

## 2020-03-01 DIAGNOSIS — E1129 Type 2 diabetes mellitus with other diabetic kidney complication: Secondary | ICD-10-CM | POA: Diagnosis not present

## 2020-03-01 DIAGNOSIS — I509 Heart failure, unspecified: Secondary | ICD-10-CM | POA: Diagnosis not present

## 2020-03-01 DIAGNOSIS — E1140 Type 2 diabetes mellitus with diabetic neuropathy, unspecified: Secondary | ICD-10-CM | POA: Diagnosis not present

## 2020-03-01 DIAGNOSIS — Z23 Encounter for immunization: Secondary | ICD-10-CM | POA: Diagnosis not present

## 2020-03-01 DIAGNOSIS — I10 Essential (primary) hypertension: Secondary | ICD-10-CM | POA: Diagnosis not present

## 2020-03-01 DIAGNOSIS — D509 Iron deficiency anemia, unspecified: Secondary | ICD-10-CM | POA: Diagnosis not present

## 2020-03-11 DIAGNOSIS — M5416 Radiculopathy, lumbar region: Secondary | ICD-10-CM | POA: Diagnosis not present

## 2020-03-12 ENCOUNTER — Telehealth: Payer: Self-pay

## 2020-03-12 NOTE — Telephone Encounter (Signed)
I spoke with [name].  I reviewed Dr. Latanya Maudlin comments and recommendations for her lab results from 03/02/2020. Per Dr. Mosetta Putt her labs were good and there is no need for IV iron. These results were faxed from [facility].  She verbalized understanding

## 2020-03-15 ENCOUNTER — Other Ambulatory Visit (HOSPITAL_COMMUNITY): Payer: Self-pay

## 2020-03-15 MED ORDER — TORSEMIDE 20 MG PO TABS: 40.0000 mg | ORAL_TABLET | Freq: Every day | ORAL | 1 refills | Status: DC

## 2020-03-15 MED ORDER — ENTRESTO 24-26 MG PO TABS: 1.0000 | ORAL_TABLET | Freq: Two times a day (BID) | ORAL | 1 refills | Status: DC

## 2020-04-01 ENCOUNTER — Ambulatory Visit: Payer: Medicare HMO

## 2020-04-08 DIAGNOSIS — F1120 Opioid dependence, uncomplicated: Secondary | ICD-10-CM | POA: Diagnosis not present

## 2020-04-08 DIAGNOSIS — M961 Postlaminectomy syndrome, not elsewhere classified: Secondary | ICD-10-CM | POA: Diagnosis not present

## 2020-04-08 DIAGNOSIS — M5136 Other intervertebral disc degeneration, lumbar region: Secondary | ICD-10-CM | POA: Diagnosis not present

## 2020-04-08 DIAGNOSIS — Z9689 Presence of other specified functional implants: Secondary | ICD-10-CM | POA: Diagnosis not present

## 2020-04-10 DIAGNOSIS — H3582 Retinal ischemia: Secondary | ICD-10-CM | POA: Diagnosis not present

## 2020-04-10 DIAGNOSIS — E113513 Type 2 diabetes mellitus with proliferative diabetic retinopathy with macular edema, bilateral: Secondary | ICD-10-CM | POA: Diagnosis not present

## 2020-04-10 DIAGNOSIS — H26493 Other secondary cataract, bilateral: Secondary | ICD-10-CM | POA: Diagnosis not present

## 2020-04-11 DIAGNOSIS — H43393 Other vitreous opacities, bilateral: Secondary | ICD-10-CM | POA: Diagnosis not present

## 2020-04-11 DIAGNOSIS — H18413 Arcus senilis, bilateral: Secondary | ICD-10-CM | POA: Diagnosis not present

## 2020-04-11 DIAGNOSIS — Z961 Presence of intraocular lens: Secondary | ICD-10-CM | POA: Diagnosis not present

## 2020-04-11 DIAGNOSIS — Z794 Long term (current) use of insulin: Secondary | ICD-10-CM | POA: Diagnosis not present

## 2020-04-11 DIAGNOSIS — E113593 Type 2 diabetes mellitus with proliferative diabetic retinopathy without macular edema, bilateral: Secondary | ICD-10-CM | POA: Diagnosis not present

## 2020-05-15 DIAGNOSIS — G4733 Obstructive sleep apnea (adult) (pediatric): Secondary | ICD-10-CM | POA: Diagnosis not present

## 2020-06-10 ENCOUNTER — Other Ambulatory Visit (HOSPITAL_COMMUNITY): Payer: Self-pay

## 2020-06-10 MED ORDER — ROSUVASTATIN CALCIUM 5 MG PO TABS: 5.0000 mg | ORAL_TABLET | Freq: Every day | ORAL | 0 refills | Status: DC

## 2020-06-10 MED ORDER — TORSEMIDE 20 MG PO TABS: 40.0000 mg | ORAL_TABLET | Freq: Every day | ORAL | 0 refills | Status: DC

## 2020-06-10 MED ORDER — ENTRESTO 24-26 MG PO TABS: 1.0000 | ORAL_TABLET | Freq: Two times a day (BID) | ORAL | 0 refills | Status: DC

## 2020-06-11 ENCOUNTER — Telehealth (HOSPITAL_COMMUNITY): Payer: Self-pay | Admitting: Cardiology

## 2020-06-11 ENCOUNTER — Other Ambulatory Visit (HOSPITAL_COMMUNITY): Payer: Self-pay

## 2020-06-11 MED ORDER — TORSEMIDE 20 MG PO TABS: 40.0000 mg | ORAL_TABLET | Freq: Every day | ORAL | 0 refills | Status: DC

## 2020-06-12 DIAGNOSIS — I10 Essential (primary) hypertension: Secondary | ICD-10-CM | POA: Diagnosis not present

## 2020-06-12 DIAGNOSIS — D509 Iron deficiency anemia, unspecified: Secondary | ICD-10-CM | POA: Diagnosis not present

## 2020-06-12 DIAGNOSIS — Z794 Long term (current) use of insulin: Secondary | ICD-10-CM | POA: Diagnosis not present

## 2020-06-12 DIAGNOSIS — G8929 Other chronic pain: Secondary | ICD-10-CM | POA: Diagnosis not present

## 2020-06-12 DIAGNOSIS — R809 Proteinuria, unspecified: Secondary | ICD-10-CM | POA: Diagnosis not present

## 2020-06-12 DIAGNOSIS — Z20822 Contact with and (suspected) exposure to covid-19: Secondary | ICD-10-CM | POA: Diagnosis not present

## 2020-06-12 DIAGNOSIS — M549 Dorsalgia, unspecified: Secondary | ICD-10-CM | POA: Diagnosis not present

## 2020-06-12 DIAGNOSIS — Z6833 Body mass index (BMI) 33.0-33.9, adult: Secondary | ICD-10-CM | POA: Diagnosis not present

## 2020-06-12 DIAGNOSIS — E559 Vitamin D deficiency, unspecified: Secondary | ICD-10-CM | POA: Diagnosis not present

## 2020-06-12 DIAGNOSIS — E1129 Type 2 diabetes mellitus with other diabetic kidney complication: Secondary | ICD-10-CM | POA: Diagnosis not present

## 2020-06-24 DIAGNOSIS — U071 COVID-19: Secondary | ICD-10-CM | POA: Diagnosis not present

## 2020-06-24 DIAGNOSIS — R29898 Other symptoms and signs involving the musculoskeletal system: Secondary | ICD-10-CM | POA: Diagnosis not present

## 2020-06-24 DIAGNOSIS — Z20822 Contact with and (suspected) exposure to covid-19: Secondary | ICD-10-CM | POA: Diagnosis not present

## 2020-06-24 DIAGNOSIS — Z6833 Body mass index (BMI) 33.0-33.9, adult: Secondary | ICD-10-CM | POA: Diagnosis not present

## 2020-06-24 DIAGNOSIS — M79641 Pain in right hand: Secondary | ICD-10-CM | POA: Diagnosis not present

## 2020-06-24 DIAGNOSIS — M79642 Pain in left hand: Secondary | ICD-10-CM | POA: Diagnosis not present

## 2020-06-27 DIAGNOSIS — Z20822 Contact with and (suspected) exposure to covid-19: Secondary | ICD-10-CM | POA: Diagnosis not present

## 2020-07-01 ENCOUNTER — Telehealth: Payer: Self-pay | Admitting: *Deleted

## 2020-07-01 NOTE — Telephone Encounter (Signed)
Notified that we received her labs. CBC and iron levels are good, no need for IV iron now.

## 2020-07-10 NOTE — Progress Notes (Incomplete)
[facility]   Telephone:([phone]) [phone] Fax:([phone]) [phone]   Clinic Follow up Note   Patient Care Team: Hamrick, Durward Fortes, MD as PCP - General (Family Medicine) Laurey Morale, MD as PCP - Advanced Heart Failure (Cardiology) Fuller Plan, RD as Dietitian Hamrick, Durward Fortes, MD as Referring Physician (Family Medicine) Hamrick, Durward Fortes, MD (Family Medicine)  Date of Service:  07/10/2020  CHIEF COMPLAINT: F/u of Iron deficiency anemia due to chronic blood loss   CURRENT THERAPY:  Feraheme 1,020 mg given On 10/12/13 and 03/20/2014, 510mg  08/29/14, 07/21/19  INTERVAL HISTORY: *** [name] is here for a follow up of anemia. She was last seen by me 1 year ago. She presents to the clinic alone.   REVIEW OF SYSTEMS:  *** Constitutional: Denies fevers, chills or abnormal weight loss Eyes: Denies blurriness of vision Ears, nose, mouth, throat, and face: Denies mucositis or sore throat Respiratory: Denies cough, dyspnea or wheezes Cardiovascular: Denies palpitation, chest discomfort or lower extremity swelling Gastrointestinal:  Denies nausea, heartburn or change in bowel habits Skin: Denies abnormal skin rashes Lymphatics: Denies new lymphadenopathy or easy bruising Neurological:Denies numbness, tingling or new weaknesses Behavioral/Psych: Mood is stable, no new changes  All other systems were reviewed with the patient and are negative.  MEDICAL HISTORY:  Past Medical History:  Diagnosis Date  . Anemia    hx of iron transfusions - last one 10/2013   . Anxiety   . Arthritis    neck and shoulders   . Asthma   . Back pain, chronic   . Bipolar disorder (HCC)   . Carpal tunnel syndrome on both sides   . Cataract   . Depression   . Diabetes mellitus   . Fibromyalgia   . GERD (gastroesophageal reflux disease)   . History of gastric bypass Jan 2013  . Hyperlipidemia   . Hypertension   . Insomnia   . Migraines   . Nervous breakdown sept 2013  . Neuropathy   .  Nonischemic cardiomyopathy (HCC)   . OSA (obstructive sleep apnea) 02/21/2015  . Peripheral vascular disease (HCC)   . Shortness of breath    with exertion   . Sleep apnea    borderline- no cpap yet  . Systolic CHF, acute on chronic (HCC)    CARDIOLOGIST-- DR 04/23/2015 NELSON  ([place])  . Wound of left breast     SURGICAL HISTORY: Past Surgical History:  Procedure Laterality Date  . BACK SURGERY  November 1992  . BREAST CYST ASPIRATION Left   . BREAST SURGERY Left    I&D for abcsess   . CARDIAC CATHETERIZATION  11-14-2013  DR 01-14-2014   NORMAL LV FILLING PRESSURE W/ MILD ELEVATED RV FILLING, POSSIBLY SUFFESTING RV FAILURE OUT OF PROPORTION TO LV FAILURE. EF ABOUT 35% WITH DIFFUSE HYPOKINESIS. NO ANGIOGRAPHIC CAD, NONISCHEMIC CARDIOMYOPATHY  . CESAREAN SECTION  December 1998  . CHOLECYSTECTOMY  1998  . DILITATION & CURRETTAGE/HYSTROSCOPY WITH NOVASURE ABLATION N/A 02/26/2014   Procedure: DILATATION & CURETTAGE/HYSTEROSCOPY WITH NOVASURE ABLATION;  Surgeon: 02/28/2014, MD;  Location: [facility];  Service: Gynecology;  Laterality: N/A;  . ESOPHAGOGASTRODUODENOSCOPY  07/27/2011   Procedure: ESOPHAGOGASTRODUODENOSCOPY (EGD);  Surgeon: 09/24/2011, MD;  Location: [facility];  Service: Endoscopy;  Laterality: N/A;  . GASTRIC ROUX-EN-Y  06/30/2011   Procedure: LAPAROSCOPIC ROUX-EN-Y GASTRIC;  Surgeon: 07/02/2011, MD;  Location: [facility];  Service: General;  Laterality: N/A;     . INCISION AND DRAINAGE OF  WOUND Left 12/14/2013   Procedure: IRRIGATION AND DEBRIDEMENT OF LEFT BREAST WOUND WITH PLACEMENT OF A-CELL, WITH CLOSURE ;  Surgeon: Wayland Denis, DO;  Location: [facility];  Service: Plastics;  Laterality: Left;  . IRRIGATION AND DEBRIDEMENT ABSCESS Left 11/26/2013   Procedure: IRRIGATION AND DEBRIDEMENT ABSCESS;  Surgeon: Velora Heckler, MD;  Location: [facility];  Service: General;  Laterality: Left;  . LEFT AND RIGHT HEART CATHETERIZATION WITH CORONARY ANGIOGRAM N/A 11/14/2013    Procedure: LEFT AND RIGHT HEART CATHETERIZATION WITH CORONARY ANGIOGRAM;  Surgeon: Laurey Morale, MD;  Location: [facility];  Service: Cardiovascular;  Laterality: N/A;  . SPINAL CORD STIMULATOR INSERTION N/A 03/09/2014   Procedure: LUMBAR SPINAL CORD STIMULATOR INSERTION;  Surgeon: Gwynne Edinger, MD;  Location: [facility];  Service: Neurosurgery;  Laterality: N/A;  . TUBAL LIGATION  January 1999    I have reviewed the social history and family history with the patient and they are unchanged from previous note.  ALLERGIES:  is allergic to imitrex [sumatriptan] and lamictal [lamotrigine].  MEDICATIONS:  Current Outpatient Medications  Medication Sig Dispense Refill  . ACCU-CHEK GUIDE test strip 1 each by Other route 3 (three) times daily.    Marland Kitchen albuterol (VENTOLIN HFA) 108 (90 Base) MCG/ACT inhaler Inhale 2 puffs into the lungs every 4 (four) hours as needed for wheezing or shortness of breath.    . Biotin 5000 MCG TABS Take 5,000 mcg by mouth daily.    . Cholecalciferol (VITAMIN D3) 5000 units TABS Take 5,000 Units by mouth daily.     . cyclobenzaprine (FLEXERIL) 10 MG tablet Take 10 mg by mouth 3 (three) times daily as needed for muscle spasms.     . diclofenac sodium (VOLTAREN) 1 % GEL Apply 2 g topically 2 (two) times daily.    . DULoxetine (CYMBALTA) 60 MG capsule Take 60 mg by mouth daily.    . DUREZOL 0.05 % EMUL Place 1 drop into the right eye 3 (three) times daily.    . famotidine (PEPCID) 20 MG tablet Take 20 mg by mouth daily.    . fluticasone (FLONASE) 50 MCG/ACT nasal spray Place 1 spray into both nostrils daily.     Marland Kitchen gatifloxacin (ZYMAXID) 0.5 % SOLN Place 1 drop into the right eye 4 (four) times daily.    Marland Kitchen ibuprofen (ADVIL,MOTRIN) 800 MG tablet Take 800 mg by mouth every 8 (eight) hours as needed (pain).     . Insulin Glargine (LANTUS SOLOSTAR) 100 UNIT/ML Solostar Pen Inject 34 Units into the skin daily.     Marland Kitchen JARDIANCE 10 MG TABS tablet Take 10 mg by mouth daily.    Marland Kitchen  ketorolac (ACULAR) 0.5 % ophthalmic solution Place 1 drop into the right eye 4 (four) times daily.    . metoprolol succinate (TOPROL-XL) 50 MG 24 hr tablet TAKE 1 TABLET BY MOUTH EVERY DAY 90 tablet 3  . MOVANTIK 25 MG TABS tablet Take 25 mg by mouth every morning.    . ondansetron (ZOFRAN) 4 MG tablet TAKE 1 TABLET BY MOUTH EVERY 8 HOURS AS NEEDED FOR NAUSEA AND VOMITING 20 tablet 0  . oxyCODONE-acetaminophen (PERCOCET) 10-325 MG per tablet Take 0.5-1 tablets by mouth every 4 (four) hours as needed for pain.    . pregabalin (LYRICA) 150 MG capsule Take 150 mg by mouth 3 (three) times daily.    . rosuvastatin (CRESTOR) 5 MG tablet Take 1 tablet (5 mg total) by mouth daily. Patient needs to make an appointment  for future refills 90 tablet 0  . sacubitril-valsartan (ENTRESTO) 24-26 MG Take 1 tablet by mouth 2 (two) times daily. Patient needs to make an appointment for future refills 180 tablet 0  . torsemide (DEMADEX) 20 MG tablet Take 2 tablets (40 mg total) by mouth daily. Patient needs to make an appointment for future refills 180 tablet 0  . VICTOZA 18 MG/3ML SOPN Inject 1.8 mg into the skin daily.     No current facility-administered medications for this visit.    PHYSICAL EXAMINATION: ECOG PERFORMANCE STATUS: {CHL ONC ECOG [phone]}  There were no vitals filed for this visit. There were no vitals filed for this visit. *** GENERAL:alert, no distress and comfortable SKIN: skin color, texture, turgor are normal, no rashes or significant lesions EYES: normal, Conjunctiva are pink and non-injected, sclera clear {OROPHARYNX:no exudate, no erythema and lips, buccal mucosa, and tongue normal}  NECK: supple, thyroid normal size, non-tender, without nodularity LYMPH:  no palpable lymphadenopathy in the cervical, axillary {or inguinal} LUNGS: clear to auscultation and percussion with normal breathing effort HEART: regular rate & rhythm and no murmurs and no lower extremity  edema ABDOMEN:abdomen soft, non-tender and normal bowel sounds Musculoskeletal:no cyanosis of digits and no clubbing  NEURO: alert & oriented x 3 with fluent speech, no focal motor/sensory deficits  LABORATORY DATA:  I have reviewed the data as listed CBC Latest Ref Rng & Units 07/14/2019 10/01/2018 08/02/2018  WBC 4.0 - 10.5 K/uL 8.4 - 8.8  Hemoglobin 12.0 - 15.0 g/dL 81.7 71.1 11.7(L)  Hematocrit 36.0 - 46.0 % 37.0 37.0 37.9  Platelets 150 - 400 K/uL 278 - 221     CMP Latest Ref Rng & Units 08/07/2019 12/05/2018 10/01/2018  Glucose 70 - 99 mg/dL 657(X) 038(B) -  BUN 6 - 20 mg/dL 18 10 -  Creatinine 3.38 - 1.00 mg/dL 3.29 1.91 6.60  Sodium 135 - 145 mmol/L 137 141 139  Potassium 3.5 - 5.1 mmol/L 3.7 3.4(L) 3.3(L)  Chloride 98 - 111 mmol/L 108 109 -  CO2 22 - 32 mmol/L 19(L) 23 -  Calcium 8.9 - 10.3 mg/dL 6.0(O) 4.5(T) -  Total Protein 6.5 - 8.1 g/dL - - -  Total Bilirubin 0.3 - 1.2 mg/dL - - -  Alkaline Phos 38 - 126 U/L - - -  AST 15 - 41 U/L - - -  ALT 0 - 44 U/L - - -      RADIOGRAPHIC STUDIES: I have personally reviewed the radiological images as listed and agreed with the findings in the report. No results found.   ASSESSMENT & PLAN:  [name] is a 50 y.o. female with   1. H.o. severe IDA secondary to gastric bypass and menorrhagia  -Previously, herlabwas consistent with iron deficiency, secondary to gastric bypass and menorrhagia. -She has had endometrial ablation in August 2015, no menstrual period since thenuntil mid 2019. Her period has restarted about 5-6 months ago, but it only lasts about two days. -She did not respond well to oral iron, and does not want try it anymore. -Received IV feraheme 10/12/2013, 03/20/2014, 08/29/2014. She responded well. Has not needed since then.  -Labs reviewed, CBC is WNLs. Iron studies pending.  -If her ferritin less than 50, will set up IV Feraheme -She will check her CBC and iron studies with her primary care physician  every 3 to 4 months, with her routine labs -Follow-up with me in 1 year  2. Fatigue and drowsiness  -Her TSH and sleep study  wall normal. -If her oral level was low, we'll replaced with IV iron, which may also contribute to her fatigue -She will continue follow-up with her primary care physician  3. DM2, CHF and nonischemic CHF  -Continue management per PCP, her endocrinologist and cardiologist    PLAN:  -iv feraheme if ferritin <50, her ferritin is 19 today, will set up one dose next week  -f/u in one year -she will check her CBC and ferritin with her PCP every 3-4 months with her routine lab -will check VitB12 level today also    No problem-specific Assessment & Plan notes found for this encounter.   No orders of the defined types were placed in this encounter.  All questions were answered. The patient knows to call the clinic with any problems, questions or concerns. No barriers to learning was detected. The total time spent in the appointment was {CHL ONC TIME VISIT - GNOIB:[id number]}.     Delphina Cahill 07/10/2020   Rogelia Rohrer, am acting as scribe for Malachy Mood, MD.   {Add scribe attestation statement}

## 2020-07-12 ENCOUNTER — Inpatient Hospital Stay: Payer: Medicare HMO

## 2020-07-12 ENCOUNTER — Inpatient Hospital Stay: Payer: Medicare HMO | Admitting: Physician Assistant

## 2020-07-12 ENCOUNTER — Telehealth: Payer: Self-pay | Admitting: Physician Assistant

## 2020-07-12 NOTE — Telephone Encounter (Signed)
The patient was scheduled for an office visit to follow up on her IDA. She was scheduled for labs at 9 AM and follow up at 9:40. She did not show up for her appointment. I called the patient to check in on her. She is sick with COVID-19. It appears she had labs performed for her IDA recently by her PCP (07/01/20) and did not need an iron infusion. I told the patient I would send a scheduling message to reschedule her in about 1-2 months for labs and follow up. She knows to expect a call from scheduling to reschedule.

## 2020-07-15 ENCOUNTER — Telehealth: Payer: Self-pay | Admitting: Hematology

## 2020-07-15 NOTE — Telephone Encounter (Signed)
Rescheduled past appointment per 1/28 schedule message. Gave option to call back to reschedule if needed.

## 2020-07-18 DIAGNOSIS — M5416 Radiculopathy, lumbar region: Secondary | ICD-10-CM | POA: Diagnosis not present

## 2020-08-08 DIAGNOSIS — M961 Postlaminectomy syndrome, not elsewhere classified: Secondary | ICD-10-CM | POA: Diagnosis not present

## 2020-08-08 DIAGNOSIS — M5136 Other intervertebral disc degeneration, lumbar region: Secondary | ICD-10-CM | POA: Diagnosis not present

## 2020-08-08 DIAGNOSIS — Z9689 Presence of other specified functional implants: Secondary | ICD-10-CM | POA: Diagnosis not present

## 2020-08-14 DIAGNOSIS — E113593 Type 2 diabetes mellitus with proliferative diabetic retinopathy without macular edema, bilateral: Secondary | ICD-10-CM | POA: Diagnosis not present

## 2020-08-14 DIAGNOSIS — H3582 Retinal ischemia: Secondary | ICD-10-CM | POA: Diagnosis not present

## 2020-08-14 DIAGNOSIS — H26493 Other secondary cataract, bilateral: Secondary | ICD-10-CM | POA: Diagnosis not present

## 2020-08-16 ENCOUNTER — Encounter (HOSPITAL_COMMUNITY): Payer: Medicare HMO | Admitting: Cardiology

## 2020-08-18 DIAGNOSIS — G4733 Obstructive sleep apnea (adult) (pediatric): Secondary | ICD-10-CM | POA: Diagnosis not present

## 2020-08-19 DIAGNOSIS — G629 Polyneuropathy, unspecified: Secondary | ICD-10-CM | POA: Diagnosis not present

## 2020-08-19 DIAGNOSIS — G5603 Carpal tunnel syndrome, bilateral upper limbs: Secondary | ICD-10-CM | POA: Diagnosis not present

## 2020-08-28 NOTE — Progress Notes (Signed)
[facility]   [phone] [phone]   Clinic Follow up Note   Patient Care Team: Hamrick, Durward Fortes, MD as PCP - General (Family Medicine) Laurey Morale, MD as PCP - Advanced Heart Failure (Cardiology) Fuller Plan, RD as Dietitian Hamrick, Durward Fortes, MD as Referring Physician (Family Medicine) Hamrick, Durward Fortes, MD (Family Medicine)  Date of Service:  08/30/2020  CHIEF COMPLAINT: F/u of Iron deficiency anemia    CURRENT THERAPY:  -IV Feraheme 1,020 mg given on 10/12/13 and 03/20/2014, 510mg  08/29/14, 07/21/19  -B12 injection monthly starting 08/30/20  -Oral B12 1000 units daily.   INTERVAL HISTORY:  SARISSA DERN is here for a follow up of anemia. She was last seen by me in 06/2019. She presents to the clinic alone. She notes her energy level is low and she feels tired again. She notes get tired easily with activities and will need to take breaks as needed. She denies SOB. She notes she has not had period since her uterine ablation. She attributes to her fatigue to her fibromyalgia and heart. She notes her fatigue got worse since COVID in 05/2020. She notes she has COVID vaccines and booster. She notes she also drops things lately even with adequate grip. She notes she use to get B12 injections by her PCP and also at home, but PCP discontinued it.     REVIEW OF SYSTEMS:   Constitutional: Denies fevers, chills or abnormal weight loss (+) Fatigue  Eyes: Denies blurriness of vision Ears, nose, mouth, throat, and face: Denies mucositis or sore throat Respiratory: Denies cough, dyspnea or wheezes Cardiovascular: Denies palpitation, chest discomfort or lower extremity swelling Gastrointestinal:  Denies nausea, heartburn or change in bowel habits Skin: Denies abnormal skin rashes Lymphatics: Denies new lymphadenopathy or easy bruising Neurological:Denies numbness, tingling or new weaknesses Behavioral/Psych: Mood is stable, no new changes  All other  systems were reviewed with the patient and are negative.  MEDICAL HISTORY:  Past Medical History:  Diagnosis Date  . Anemia    hx of iron transfusions - last one 10/2013   . Anxiety   . Arthritis    neck and shoulders   . Asthma   . Back pain, chronic   . Bipolar disorder (HCC)   . Carpal tunnel syndrome on both sides   . Cataract   . Depression   . Diabetes mellitus   . Fibromyalgia   . GERD (gastroesophageal reflux disease)   . History of gastric bypass Jan 2013  . Hyperlipidemia   . Hypertension   . Insomnia   . Migraines   . Nervous breakdown sept 2013  . Neuropathy   . Nonischemic cardiomyopathy (HCC)   . OSA (obstructive sleep apnea) 02/21/2015  . Peripheral vascular disease (HCC)   . Shortness of breath    with exertion   . Sleep apnea    borderline- no cpap yet  . Systolic CHF, acute on chronic (HCC)    CARDIOLOGIST-- DR 04/23/2015 NELSON  ([place])  . Wound of left breast     SURGICAL HISTORY: Past Surgical History:  Procedure Laterality Date  . BACK SURGERY  November 1992  . BREAST CYST ASPIRATION Left   . BREAST SURGERY Left    I&D for abcsess   . CARDIAC CATHETERIZATION  11-14-2013  DR 01-14-2014   NORMAL LV FILLING PRESSURE W/ MILD ELEVATED RV FILLING, POSSIBLY SUFFESTING RV FAILURE OUT OF PROPORTION TO LV FAILURE. EF ABOUT 35% WITH DIFFUSE HYPOKINESIS. NO ANGIOGRAPHIC  CAD, NONISCHEMIC CARDIOMYOPATHY  . CESAREAN SECTION  December 1998  . CHOLECYSTECTOMY  1998  . DILITATION & CURRETTAGE/HYSTROSCOPY WITH NOVASURE ABLATION N/A 02/26/2014   Procedure: DILATATION & CURETTAGE/HYSTEROSCOPY WITH NOVASURE ABLATION;  Surgeon: Meriel Pica, MD;  Location: [facility];  Service: Gynecology;  Laterality: N/A;  . ESOPHAGOGASTRODUODENOSCOPY  07/27/2011   Procedure: ESOPHAGOGASTRODUODENOSCOPY (EGD);  Surgeon: Freddy Jaksch, MD;  Location: [facility];  Service: Endoscopy;  Laterality: N/A;  . GASTRIC ROUX-EN-Y  06/30/2011   Procedure: LAPAROSCOPIC ROUX-EN-Y GASTRIC;   Surgeon: Mariella Saa, MD;  Location: [facility];  Service: General;  Laterality: N/A;     . INCISION AND DRAINAGE OF WOUND Left 12/14/2013   Procedure: IRRIGATION AND DEBRIDEMENT OF LEFT BREAST WOUND WITH PLACEMENT OF A-CELL, WITH CLOSURE ;  Surgeon: Wayland Denis, DO;  Location: [facility];  Service: Plastics;  Laterality: Left;  . IRRIGATION AND DEBRIDEMENT ABSCESS Left 11/26/2013   Procedure: IRRIGATION AND DEBRIDEMENT ABSCESS;  Surgeon: Velora Heckler, MD;  Location: [facility];  Service: General;  Laterality: Left;  . LEFT AND RIGHT HEART CATHETERIZATION WITH CORONARY ANGIOGRAM N/A 11/14/2013   Procedure: LEFT AND RIGHT HEART CATHETERIZATION WITH CORONARY ANGIOGRAM;  Surgeon: Laurey Morale, MD;  Location: [facility];  Service: Cardiovascular;  Laterality: N/A;  . SPINAL CORD STIMULATOR INSERTION N/A 03/09/2014   Procedure: LUMBAR SPINAL CORD STIMULATOR INSERTION;  Surgeon: Gwynne Edinger, MD;  Location: [facility];  Service: Neurosurgery;  Laterality: N/A;  . TUBAL LIGATION  January 1999    I have reviewed the social history and family history with the patient and they are unchanged from previous note.  ALLERGIES:  is allergic to imitrex [sumatriptan] and lamictal [lamotrigine].  MEDICATIONS:  Current Outpatient Medications  Medication Sig Dispense Refill  . ACCU-CHEK GUIDE test strip 1 each by Other route 3 (three) times daily.    Marland Kitchen albuterol (VENTOLIN HFA) 108 (90 Base) MCG/ACT inhaler Inhale 2 puffs into the lungs every 4 (four) hours as needed for wheezing or shortness of breath.    . Biotin 5000 MCG TABS Take 5,000 mcg by mouth daily.    . Cholecalciferol (VITAMIN D3) 5000 units TABS Take 5,000 Units by mouth daily.     . cyclobenzaprine (FLEXERIL) 10 MG tablet Take 10 mg by mouth 3 (three) times daily as needed for muscle spasms.     . diclofenac sodium (VOLTAREN) 1 % GEL Apply 2 g topically 2 (two) times daily.    . DULoxetine (CYMBALTA) 60 MG capsule Take 60 mg by mouth daily.    .  DUREZOL 0.05 % EMUL Place 1 drop into the right eye 3 (three) times daily.    . famotidine (PEPCID) 20 MG tablet Take 20 mg by mouth daily.    . fluticasone (FLONASE) 50 MCG/ACT nasal spray Place 1 spray into both nostrils daily.     Marland Kitchen gatifloxacin (ZYMAXID) 0.5 % SOLN Place 1 drop into the right eye 4 (four) times daily.    Marland Kitchen ibuprofen (ADVIL,MOTRIN) 800 MG tablet Take 800 mg by mouth every 8 (eight) hours as needed (pain).     . Insulin Glargine (LANTUS SOLOSTAR) 100 UNIT/ML Solostar Pen Inject 34 Units into the skin daily.     Marland Kitchen JARDIANCE 10 MG TABS tablet Take 10 mg by mouth daily.    Marland Kitchen ketorolac (ACULAR) 0.5 % ophthalmic solution Place 1 drop into the right eye 4 (four) times daily.    . metoprolol succinate (TOPROL-XL) 50 MG 24 hr tablet  TAKE 1 TABLET BY MOUTH EVERY DAY 90 tablet 3  . MOVANTIK 25 MG TABS tablet Take 25 mg by mouth every morning.    . ondansetron (ZOFRAN) 4 MG tablet TAKE 1 TABLET BY MOUTH EVERY 8 HOURS AS NEEDED FOR NAUSEA AND VOMITING 20 tablet 0  . oxyCODONE-acetaminophen (PERCOCET) 10-325 MG per tablet Take 0.5-1 tablets by mouth every 4 (four) hours as needed for pain.    . pregabalin (LYRICA) 150 MG capsule Take 150 mg by mouth 3 (three) times daily.    . rosuvastatin (CRESTOR) 5 MG tablet Take 1 tablet (5 mg total) by mouth daily. Patient needs to make an appointment for future refills 90 tablet 0  . sacubitril-valsartan (ENTRESTO) 24-26 MG Take 1 tablet by mouth 2 (two) times daily. Patient needs to make an appointment for future refills 180 tablet 0  . torsemide (DEMADEX) 20 MG tablet Take 2 tablets (40 mg total) by mouth daily. Patient needs to make an appointment for future refills 180 tablet 0  . VICTOZA 18 MG/3ML SOPN Inject 1.8 mg into the skin daily.     No current facility-administered medications for this visit.    PHYSICAL EXAMINATION: ECOG PERFORMANCE STATUS: 1 - Symptomatic but completely ambulatory  Vitals:   08/30/20 0936  BP: 139/82  Pulse: 84   Resp: 19  Temp: (!) 97.2 F (36.2 C)  SpO2: 100%   Filed Weights   08/30/20 0936  Weight: 205 lb 11.2 oz (93.3 kg)    Due to COVID19 we will limit examination to appearance. Patient had no complaints.  GENERAL:alert, no distress and comfortable SKIN: skin color normal, no rashes or significant lesions EYES: normal, Conjunctiva are pink and non-injected, sclera clear  NEURO: alert & oriented x 3 with fluent speech   LABORATORY DATA:  I have reviewed the data as listed CBC Latest Ref Rng & Units 08/30/2020 07/14/2019 10/01/2018  WBC 4.0 - 10.5 K/uL 10.8(H) 8.4 -  Hemoglobin 12.0 - 15.0 g/dL 07.8 67.5 44.9  Hematocrit 36.0 - 46.0 % 37.3 37.0 37.0  Platelets 150 - 400 K/uL 281 278 -     CMP Latest Ref Rng & Units 08/07/2019 12/05/2018 10/01/2018  Glucose 70 - 99 mg/dL 201(E) 071(Q) -  BUN 6 - 20 mg/dL 18 10 -  Creatinine 1.97 - 1.00 mg/dL 5.88 3.25 4.98  Sodium 135 - 145 mmol/L 137 141 139  Potassium 3.5 - 5.1 mmol/L 3.7 3.4(L) 3.3(L)  Chloride 98 - 111 mmol/L 108 109 -  CO2 22 - 32 mmol/L 19(L) 23 -  Calcium 8.9 - 10.3 mg/dL 2.6(E) 1.5(A) -  Total Protein 6.5 - 8.1 g/dL - - -  Total Bilirubin 0.3 - 1.2 mg/dL - - -  Alkaline Phos 38 - 126 U/L - - -  AST 15 - 41 U/L - - -  ALT 0 - 44 U/L - - -      RADIOGRAPHIC STUDIES: I have personally reviewed the radiological images as listed and agreed with the findings in the report. No results found.   ASSESSMENT & PLAN:  [name] is a 50 y.o. female with    1. H.o. severe IDA secondary to gastric bypass and menorrhagia  -Previously, herlabwas consistent with iron deficiency, secondary to gastric bypass and menorrhagia. -She has had endometrial ablation in August 2015, no menstrual period since thenuntil mid 2019. She will now only have spotting during her period times.  -She did not respond well to oral iron, and does not want  try it anymore. -she was treated with IV feraheme 10/12/2013, 03/20/2014, 08/29/2014. She  responded well. Has not needed since then until 07/21/19. That was the last dose.  -She notes she has been more fatigued from her comorbidities and COVID infection in 05/2020. Labs reviewed, WBC 10.8, no anemia. Her Iron panel is still pending. If her ferritin less than 50, will set up IV Feraheme -She notes dropping items occasionally. I discussed s/p bypass surgery she may have B12 deficiency. Her B12 level has been decreasing over the years. I discussed restarting B12 injections. She is interested, will start today (08/30/20). She can also start Oral B12 1000 units daily.  -Continue to monitor with labs with other physicians every 3-4 months.  -f/u in 6 months then yearly.   2. B12 deficiency  -she used to be on B12 injection, stopped a while ago -Her B12 level has been trending down in the past few years  -due to her recent neuropathy in hands, I will check her B12 level today, and start her back on B12 injection.  Level, will determine if she needs weekly injection   3. Fatigue and drowsiness  -Her prior TSH and sleep study wall normal. Her iron level is has been doing well.  -She attributes this to her comorbidities and worsened since her 05/2020 COVID infection.   4. DM2, CHF and nonischemic CHF, Fibromyalgia  -Continue management per PCP, her endocrinologist and cardiologist   5. COVID (+) in 05/2020: Did not receive treatment. Fatigue has been exacerbated since -She has her COVID vaccines and booster  -I discussed if she is to get COVID again she can contact the clinic to get eligible treatment.     PLAN:  -B12 injection today, then weekly X4, then monthly  -repeat lab in 3 months  -start oral B12  -Will set up iv feraheme if ferritin <50, no need this time  -Lab and F/u in 6 months    No problem-specific Assessment & Plan notes found for this encounter.   Orders Placed This Encounter  Procedures  . Vitamin B12    Standing Status:   Standing    Number of  Occurrences:   3    Standing Expiration Date:   08/30/2021  . Methylmalonic acid, serum    Standing Status:   Standing    Number of Occurrences:   4    Standing Expiration Date:   08/30/2021  . SCHEDULING COMMUNICATION INJECTION    Schedule 1 hour injection appointment  . Treatment conditions    Provider to add parameters.    Standing Status:   Standing    Number of Occurrences:   1    Order Specific Question:   Did you provide treatment parameters in the comments section?    Answer:   No   All questions were answered. The patient knows to call the clinic with any problems, questions or concerns. No barriers to learning was detected. The total time spent in the appointment was 30 minutes.     Malachy Mood, MD 08/30/2020   I, Delphina Cahill, am acting as scribe for Malachy Mood, MD.   I have reviewed the above documentation for accuracy and completeness, and I agree with the above.

## 2020-08-30 ENCOUNTER — Other Ambulatory Visit: Payer: Self-pay

## 2020-08-30 ENCOUNTER — Encounter: Payer: Self-pay | Admitting: Hematology

## 2020-08-30 ENCOUNTER — Telehealth: Payer: Self-pay | Admitting: Hematology

## 2020-08-30 ENCOUNTER — Inpatient Hospital Stay: Payer: Medicare Other | Attending: Hematology

## 2020-08-30 ENCOUNTER — Inpatient Hospital Stay (HOSPITAL_BASED_OUTPATIENT_CLINIC_OR_DEPARTMENT_OTHER): Payer: Medicare Other | Admitting: Hematology

## 2020-08-30 VITALS — BP 139/82 | HR 84 | Temp 97.2°F | Resp 19 | Ht 65.0 in | Wt 205.7 lb

## 2020-08-30 DIAGNOSIS — D508 Other iron deficiency anemias: Secondary | ICD-10-CM | POA: Insufficient documentation

## 2020-08-30 DIAGNOSIS — Z794 Long term (current) use of insulin: Secondary | ICD-10-CM | POA: Insufficient documentation

## 2020-08-30 DIAGNOSIS — Z79899 Other long term (current) drug therapy: Secondary | ICD-10-CM | POA: Diagnosis not present

## 2020-08-30 DIAGNOSIS — N920 Excessive and frequent menstruation with regular cycle: Secondary | ICD-10-CM | POA: Insufficient documentation

## 2020-08-30 DIAGNOSIS — Z9884 Bariatric surgery status: Secondary | ICD-10-CM | POA: Insufficient documentation

## 2020-08-30 DIAGNOSIS — D500 Iron deficiency anemia secondary to blood loss (chronic): Secondary | ICD-10-CM | POA: Diagnosis not present

## 2020-08-30 DIAGNOSIS — E538 Deficiency of other specified B group vitamins: Secondary | ICD-10-CM | POA: Insufficient documentation

## 2020-08-30 LAB — IRON AND TIBC
Iron: 58 ug/dL (ref 41–142)
Saturation Ratios: 17 % — ABNORMAL LOW (ref 21–57)
TIBC: 331 ug/dL (ref 236–444)
UIBC: 273 ug/dL (ref 120–384)

## 2020-08-30 LAB — CBC WITH DIFFERENTIAL/PLATELET
Abs Immature Granulocytes: 0.04 10*3/uL (ref 0.00–0.07)
Basophils Absolute: 0.1 10*3/uL (ref 0.0–0.1)
Basophils Relative: 1 %
Eosinophils Absolute: 0.8 10*3/uL — ABNORMAL HIGH (ref 0.0–0.5)
Eosinophils Relative: 8 %
HCT: 37.3 % (ref 36.0–46.0)
Hemoglobin: 12.3 g/dL (ref 12.0–15.0)
Immature Granulocytes: 0 %
Lymphocytes Relative: 22 %
Lymphs Abs: 2.3 10*3/uL (ref 0.7–4.0)
MCH: 29.7 pg (ref 26.0–34.0)
MCHC: 33 g/dL (ref 30.0–36.0)
MCV: 90.1 fL (ref 80.0–100.0)
Monocytes Absolute: 0.6 10*3/uL (ref 0.1–1.0)
Monocytes Relative: 5 %
Neutro Abs: 7 10*3/uL (ref 1.7–7.7)
Neutrophils Relative %: 64 %
Platelets: 281 10*3/uL (ref 150–400)
RBC: 4.14 MIL/uL (ref 3.87–5.11)
RDW: 12.9 % (ref 11.5–15.5)
WBC: 10.8 10*3/uL — ABNORMAL HIGH (ref 4.0–10.5)
nRBC: 0 % (ref 0.0–0.2)

## 2020-08-30 LAB — VITAMIN B12: Vitamin B-12: 205 pg/mL (ref 180–914)

## 2020-08-30 LAB — FERRITIN: Ferritin: 65 ng/mL (ref 11–307)

## 2020-08-30 MED ORDER — CYANOCOBALAMIN 1000 MCG/ML IJ SOLN
INTRAMUSCULAR | Status: AC
  Filled 2020-08-30: qty 1

## 2020-08-30 MED ORDER — CYANOCOBALAMIN 1000 MCG/ML IJ SOLN
1000.0000 ug | Freq: Once | INTRAMUSCULAR | Status: AC
  Administered 2020-08-30: 1000 ug via INTRAMUSCULAR

## 2020-08-30 NOTE — Telephone Encounter (Signed)
Scheduled per los. Gave avs and calendar  

## 2020-09-02 LAB — METHYLMALONIC ACID, SERUM: Methylmalonic Acid, Quantitative: 488 nmol/L — ABNORMAL HIGH (ref 0–378)

## 2020-09-03 ENCOUNTER — Telehealth: Payer: Self-pay | Admitting: Hematology

## 2020-09-03 NOTE — Telephone Encounter (Signed)
Scheduled appts per 3/18 sch msg. Called pt, no answer. Left msg with appts dates and times.

## 2020-09-04 DIAGNOSIS — T402X5A Adverse effect of other opioids, initial encounter: Secondary | ICD-10-CM | POA: Diagnosis not present

## 2020-09-04 DIAGNOSIS — I509 Heart failure, unspecified: Secondary | ICD-10-CM | POA: Diagnosis not present

## 2020-09-04 DIAGNOSIS — G8929 Other chronic pain: Secondary | ICD-10-CM | POA: Diagnosis not present

## 2020-09-04 DIAGNOSIS — D509 Iron deficiency anemia, unspecified: Secondary | ICD-10-CM | POA: Diagnosis not present

## 2020-09-04 DIAGNOSIS — I10 Essential (primary) hypertension: Secondary | ICD-10-CM | POA: Diagnosis not present

## 2020-09-04 DIAGNOSIS — K5903 Drug induced constipation: Secondary | ICD-10-CM | POA: Diagnosis not present

## 2020-09-04 DIAGNOSIS — E1129 Type 2 diabetes mellitus with other diabetic kidney complication: Secondary | ICD-10-CM | POA: Diagnosis not present

## 2020-09-04 DIAGNOSIS — E785 Hyperlipidemia, unspecified: Secondary | ICD-10-CM | POA: Diagnosis not present

## 2020-09-04 DIAGNOSIS — R809 Proteinuria, unspecified: Secondary | ICD-10-CM | POA: Diagnosis not present

## 2020-09-04 DIAGNOSIS — I428 Other cardiomyopathies: Secondary | ICD-10-CM | POA: Diagnosis not present

## 2020-09-04 DIAGNOSIS — Z794 Long term (current) use of insulin: Secondary | ICD-10-CM | POA: Diagnosis not present

## 2020-09-04 DIAGNOSIS — M549 Dorsalgia, unspecified: Secondary | ICD-10-CM | POA: Diagnosis not present

## 2020-09-06 ENCOUNTER — Other Ambulatory Visit: Payer: Self-pay

## 2020-09-06 ENCOUNTER — Inpatient Hospital Stay: Payer: Medicare Other

## 2020-09-06 VITALS — BP 137/90 | HR 78 | Resp 18

## 2020-09-06 DIAGNOSIS — Z794 Long term (current) use of insulin: Secondary | ICD-10-CM | POA: Diagnosis not present

## 2020-09-06 DIAGNOSIS — Z9884 Bariatric surgery status: Secondary | ICD-10-CM | POA: Diagnosis not present

## 2020-09-06 DIAGNOSIS — Z79899 Other long term (current) drug therapy: Secondary | ICD-10-CM | POA: Diagnosis not present

## 2020-09-06 DIAGNOSIS — H26492 Other secondary cataract, left eye: Secondary | ICD-10-CM | POA: Diagnosis not present

## 2020-09-06 DIAGNOSIS — E538 Deficiency of other specified B group vitamins: Secondary | ICD-10-CM | POA: Diagnosis not present

## 2020-09-06 DIAGNOSIS — D508 Other iron deficiency anemias: Secondary | ICD-10-CM | POA: Diagnosis not present

## 2020-09-06 DIAGNOSIS — Z961 Presence of intraocular lens: Secondary | ICD-10-CM | POA: Diagnosis not present

## 2020-09-06 DIAGNOSIS — H43393 Other vitreous opacities, bilateral: Secondary | ICD-10-CM | POA: Diagnosis not present

## 2020-09-06 DIAGNOSIS — D500 Iron deficiency anemia secondary to blood loss (chronic): Secondary | ICD-10-CM

## 2020-09-06 DIAGNOSIS — H04123 Dry eye syndrome of bilateral lacrimal glands: Secondary | ICD-10-CM | POA: Diagnosis not present

## 2020-09-06 MED ORDER — CYANOCOBALAMIN 1000 MCG/ML IJ SOLN
INTRAMUSCULAR | Status: AC
  Filled 2020-09-06: qty 1

## 2020-09-06 MED ORDER — CYANOCOBALAMIN 1000 MCG/ML IJ SOLN
1000.0000 ug | Freq: Once | INTRAMUSCULAR | Status: AC
  Administered 2020-09-06: 1000 ug via INTRAMUSCULAR

## 2020-09-06 NOTE — Patient Instructions (Signed)

## 2020-09-11 ENCOUNTER — Encounter (HOSPITAL_COMMUNITY): Payer: Medicare Other | Admitting: Cardiology

## 2020-09-13 ENCOUNTER — Other Ambulatory Visit: Payer: Self-pay

## 2020-09-13 ENCOUNTER — Inpatient Hospital Stay: Payer: Medicare Other | Attending: Hematology

## 2020-09-13 VITALS — BP 146/90 | HR 72 | Temp 98.8°F | Resp 18

## 2020-09-13 DIAGNOSIS — E538 Deficiency of other specified B group vitamins: Secondary | ICD-10-CM | POA: Diagnosis not present

## 2020-09-13 DIAGNOSIS — D500 Iron deficiency anemia secondary to blood loss (chronic): Secondary | ICD-10-CM

## 2020-09-13 MED ORDER — CYANOCOBALAMIN 1000 MCG/ML IJ SOLN
INTRAMUSCULAR | Status: AC
  Filled 2020-09-13: qty 1

## 2020-09-13 MED ORDER — CYANOCOBALAMIN 1000 MCG/ML IJ SOLN
1000.0000 ug | Freq: Once | INTRAMUSCULAR | Status: AC
  Administered 2020-09-13: 1000 ug via INTRAMUSCULAR

## 2020-09-18 DIAGNOSIS — G4733 Obstructive sleep apnea (adult) (pediatric): Secondary | ICD-10-CM | POA: Diagnosis not present

## 2020-09-20 ENCOUNTER — Other Ambulatory Visit: Payer: Self-pay

## 2020-09-20 ENCOUNTER — Inpatient Hospital Stay: Payer: Medicare Other

## 2020-09-20 VITALS — BP 146/82 | HR 77

## 2020-09-20 DIAGNOSIS — D500 Iron deficiency anemia secondary to blood loss (chronic): Secondary | ICD-10-CM

## 2020-09-20 DIAGNOSIS — E538 Deficiency of other specified B group vitamins: Secondary | ICD-10-CM | POA: Diagnosis not present

## 2020-09-20 MED ORDER — CYANOCOBALAMIN 1000 MCG/ML IJ SOLN
1000.0000 ug | Freq: Once | INTRAMUSCULAR | Status: AC
  Administered 2020-09-20: 1000 ug via INTRAMUSCULAR

## 2020-09-20 MED ORDER — CYANOCOBALAMIN 1000 MCG/ML IJ SOLN
INTRAMUSCULAR | Status: AC
  Filled 2020-09-20: qty 1

## 2020-09-23 ENCOUNTER — Other Ambulatory Visit: Payer: Self-pay | Admitting: Family Medicine

## 2020-09-23 DIAGNOSIS — E785 Hyperlipidemia, unspecified: Secondary | ICD-10-CM | POA: Diagnosis not present

## 2020-09-23 DIAGNOSIS — Z1231 Encounter for screening mammogram for malignant neoplasm of breast: Secondary | ICD-10-CM

## 2020-09-23 DIAGNOSIS — Z9181 History of falling: Secondary | ICD-10-CM | POA: Diagnosis not present

## 2020-09-23 DIAGNOSIS — Z0000 Encounter for general adult medical examination without abnormal findings: Secondary | ICD-10-CM | POA: Diagnosis not present

## 2020-09-27 ENCOUNTER — Inpatient Hospital Stay: Payer: Medicare Other

## 2020-10-01 ENCOUNTER — Telehealth: Payer: Self-pay | Admitting: Hematology

## 2020-10-01 ENCOUNTER — Ambulatory Visit: Payer: Medicare Other

## 2020-10-01 NOTE — Telephone Encounter (Signed)
R/s appt per 4/19 sch msg. Pt aware.  

## 2020-10-02 ENCOUNTER — Telehealth: Payer: Self-pay | Admitting: Hematology

## 2020-10-02 DIAGNOSIS — N390 Urinary tract infection, site not specified: Secondary | ICD-10-CM | POA: Diagnosis not present

## 2020-10-02 DIAGNOSIS — R7989 Other specified abnormal findings of blood chemistry: Secondary | ICD-10-CM | POA: Diagnosis not present

## 2020-10-02 NOTE — Telephone Encounter (Signed)
R/s appt per 4/20 sch msg. Pt aware.  

## 2020-10-04 ENCOUNTER — Other Ambulatory Visit: Payer: Self-pay

## 2020-10-04 ENCOUNTER — Inpatient Hospital Stay: Payer: Medicare Other

## 2020-10-04 VITALS — BP 129/95 | HR 82 | Temp 99.0°F | Resp 18

## 2020-10-04 DIAGNOSIS — D500 Iron deficiency anemia secondary to blood loss (chronic): Secondary | ICD-10-CM

## 2020-10-04 DIAGNOSIS — E538 Deficiency of other specified B group vitamins: Secondary | ICD-10-CM | POA: Diagnosis not present

## 2020-10-04 MED ORDER — CYANOCOBALAMIN 1000 MCG/ML IJ SOLN
INTRAMUSCULAR | Status: AC
  Filled 2020-10-04: qty 1

## 2020-10-04 MED ORDER — CYANOCOBALAMIN 1000 MCG/ML IJ SOLN
1000.0000 ug | Freq: Once | INTRAMUSCULAR | Status: AC
  Administered 2020-10-04: 1000 ug via INTRAMUSCULAR

## 2020-10-09 DIAGNOSIS — B372 Candidiasis of skin and nail: Secondary | ICD-10-CM | POA: Diagnosis not present

## 2020-10-18 ENCOUNTER — Ambulatory Visit (HOSPITAL_COMMUNITY)
Admission: RE | Admit: 2020-10-18 | Discharge: 2020-10-18 | Disposition: A | Discharge status: Home / Self Care | Payer: Medicare Other | Source: Ambulatory Visit | Attending: Cardiology | Admitting: Cardiology

## 2020-10-18 ENCOUNTER — Other Ambulatory Visit: Payer: Self-pay

## 2020-10-18 ENCOUNTER — Encounter (HOSPITAL_COMMUNITY): Payer: Self-pay | Admitting: Cardiology

## 2020-10-18 VITALS — BP 140/80 | HR 76 | Wt 203.0 lb

## 2020-10-18 DIAGNOSIS — I2510 Atherosclerotic heart disease of native coronary artery without angina pectoris: Secondary | ICD-10-CM | POA: Insufficient documentation

## 2020-10-18 DIAGNOSIS — Z8249 Family history of ischemic heart disease and other diseases of the circulatory system: Secondary | ICD-10-CM | POA: Diagnosis not present

## 2020-10-18 DIAGNOSIS — Z7984 Long term (current) use of oral hypoglycemic drugs: Secondary | ICD-10-CM | POA: Insufficient documentation

## 2020-10-18 DIAGNOSIS — E119 Type 2 diabetes mellitus without complications: Secondary | ICD-10-CM | POA: Insufficient documentation

## 2020-10-18 DIAGNOSIS — Z794 Long term (current) use of insulin: Secondary | ICD-10-CM | POA: Insufficient documentation

## 2020-10-18 DIAGNOSIS — G4733 Obstructive sleep apnea (adult) (pediatric): Secondary | ICD-10-CM | POA: Insufficient documentation

## 2020-10-18 DIAGNOSIS — Z79899 Other long term (current) drug therapy: Secondary | ICD-10-CM | POA: Insufficient documentation

## 2020-10-18 DIAGNOSIS — E785 Hyperlipidemia, unspecified: Secondary | ICD-10-CM | POA: Insufficient documentation

## 2020-10-18 DIAGNOSIS — I110 Hypertensive heart disease with heart failure: Secondary | ICD-10-CM | POA: Diagnosis not present

## 2020-10-18 DIAGNOSIS — I5022 Chronic systolic (congestive) heart failure: Secondary | ICD-10-CM | POA: Insufficient documentation

## 2020-10-18 DIAGNOSIS — K760 Fatty (change of) liver, not elsewhere classified: Secondary | ICD-10-CM | POA: Insufficient documentation

## 2020-10-18 DIAGNOSIS — E669 Obesity, unspecified: Secondary | ICD-10-CM | POA: Insufficient documentation

## 2020-10-18 DIAGNOSIS — R7401 Elevation of levels of liver transaminase levels: Secondary | ICD-10-CM | POA: Insufficient documentation

## 2020-10-18 DIAGNOSIS — R079 Chest pain, unspecified: Secondary | ICD-10-CM | POA: Diagnosis not present

## 2020-10-18 DIAGNOSIS — Z9884 Bariatric surgery status: Secondary | ICD-10-CM | POA: Insufficient documentation

## 2020-10-18 DIAGNOSIS — I428 Other cardiomyopathies: Secondary | ICD-10-CM | POA: Diagnosis not present

## 2020-10-18 DIAGNOSIS — Z6833 Body mass index (BMI) 33.0-33.9, adult: Secondary | ICD-10-CM | POA: Insufficient documentation

## 2020-10-18 LAB — LIPID PANEL
Cholesterol: 189 mg/dL (ref 0–200)
HDL: 55 mg/dL (ref >40)
LDL Cholesterol: 118 mg/dL — ABNORMAL HIGH (ref 0–99)
Total CHOL/HDL Ratio: 3.4 RATIO
Triglycerides: 80 mg/dL (ref <150)
VLDL: 16 mg/dL (ref 0–40)

## 2020-10-18 LAB — BASIC METABOLIC PANEL
Anion gap: 5 (ref 5–15)
BUN: 13 mg/dL (ref 6–20)
CO2: 25 mmol/L (ref 22–32)
Calcium: 9.7 mg/dL (ref 8.9–10.3)
Chloride: 111 mmol/L (ref 98–111)
Creatinine, Ser: 0.94 mg/dL (ref 0.44–1.00)
GFR, Estimated: >60 mL/min (ref >60)
Glucose, Bld: 58 mg/dL — ABNORMAL LOW (ref 70–99)
Potassium: 4.2 mmol/L (ref 3.5–5.1)
Sodium: 141 mmol/L (ref 135–145)

## 2020-10-18 NOTE — Patient Instructions (Signed)
EKG done today.  Labs done today. We will contact you only if your labs are abnormal.  No medication changes were made. Please continue all current medications as prescribed.  Non-Cardiac CT Angiography (CTA), is a special type of CT scan that uses a computer to produce multi-dimensional views of major blood vessels throughout the body. In CT angiography, a contrast material is injected through an IV to help visualize the blood vessels. This has to be approved through your insurance company prior to scheduling. Once approved we will contact you to schedule an appointment.  Your physician recommends that you schedule a follow-up appointment in: 6 months. Please contact our office in October to schedule a November appointment.    If you have any questions or concerns before your next appointment please send [place] a message through [place] or call our office at [phone].    TO LEAVE A MESSAGE FOR THE NURSE SELECT OPTION 2, PLEASE LEAVE A MESSAGE INCLUDING: . YOUR NAME . DATE OF BIRTH . CALL BACK NUMBER . REASON FOR CALL**this is important as we prioritize the call backs  YOU WILL RECEIVE A CALL BACK THE SAME DAY AS LONG AS YOU CALL BEFORE 4:00 PM   Do the following things EVERYDAY: 1) Weigh yourself in the morning before breakfast. Write it down and keep it in a log. 2) Take your medicines as prescribed 3) Eat low salt foods--Limit salt (sodium) to 2000 mg per day.  4) Stay as active as you can everyday 5) Limit all fluids for the day to less than 2 liters   At the [facility], you and your health needs are our priority. As part of our continuing mission to provide you with exceptional heart care, we have created designated Provider Care Teams. These Care Teams include your primary Cardiologist (physician) and Advanced Practice Providers (APPs- Physician Assistants and Nurse Practitioners) who all work together to provide you with the care you need, when you need it.    You may see any of the following providers on your designated Care Team at your next follow up: Marland Kitchen Dr Arvilla Meres . Dr Marca Ancona . Tonye Becket, NP . Robbie Lis, PA . Karle Plumber, PharmD   Please be sure to bring in all your medications bottles to every appointment.
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
## 2020-10-21 ENCOUNTER — Other Ambulatory Visit (HOSPITAL_COMMUNITY): Payer: Self-pay

## 2020-10-21 DIAGNOSIS — R079 Chest pain, unspecified: Secondary | ICD-10-CM

## 2020-10-21 NOTE — Progress Notes (Signed)
Per jasmine brown, cma original order was wrong; old order cancelled and replaced with order below.   Orders Placed This Encounter  Procedures  . CT CORONARY MORPH W/CTA COR W/SCORE W/CA W/CM &/OR WO/CM    Standing Status:   Future    Standing Expiration Date:   10/21/2021    Order Specific Question:   If indicated for the ordered procedure, I authorize the administration of contrast media per Radiology protocol    Answer:   Yes    Order Specific Question:   Is patient pregnant?    Answer:   No    Order Specific Question:   Preferred Imaging Location?    Answer:   [facility]    Order Specific Question:   Release to patient    Answer:   Immediate  . CT CORONARY FRACTIONAL FLOW RESERVE DATA PREP    FFR Data Prep and Fluid analysis orders will be ordered for pre-authorization.    Standing Status:   Future    Standing Expiration Date:   10/21/2021    Order Specific Question:   If indicated for the ordered procedure, I authorize the administration of contrast media per Radiology protocol    Answer:   Yes    Order Specific Question:   Is patient pregnant?    Answer:   No    Order Specific Question:   Preferred Imaging Location?    Answer:   [facility]    Order Specific Question:   Release to patient    Answer:   Immediate  . CT CORONARY FRACTIONAL FLOW RESERVE FLUID ANALYSIS    FFR Data Prep and Fluid analysis orders will be ordered for pre-authorization.    Standing Status:   Future    Standing Expiration Date:   10/21/2021    Order Specific Question:   If indicated for the ordered procedure, I authorize the administration of contrast media per Radiology protocol    Answer:   Yes    Order Specific Question:   Is patient pregnant?    Answer:   No    Order Specific Question:   Preferred Imaging Location?    Answer:   [facility]    Order Specific Question:   Release to patient    Answer:   Immediate

## 2020-10-23 ENCOUNTER — Telehealth (HOSPITAL_COMMUNITY): Payer: Self-pay | Admitting: *Deleted

## 2020-10-23 ENCOUNTER — Other Ambulatory Visit (HOSPITAL_COMMUNITY): Payer: Self-pay | Admitting: Emergency Medicine

## 2020-10-23 DIAGNOSIS — R079 Chest pain, unspecified: Secondary | ICD-10-CM

## 2020-10-23 MED ORDER — ROSUVASTATIN CALCIUM 10 MG PO TABS: 10.0000 mg | ORAL_TABLET | Freq: Every day | ORAL | 3 refills | Status: AC

## 2020-10-23 MED ORDER — METOPROLOL TARTRATE 100 MG PO TABS
100.0000 mg | ORAL_TABLET | Freq: Once | ORAL | 0 refills | Status: DC
Start: 2020-10-23 — End: 2020-11-25

## 2020-10-23 NOTE — Telephone Encounter (Signed)
[name], [place]  10/23/2020 4:15 PM EDT Back to Top     Left detailed VM per pts request and sent crestor to pharmacy.    [name], [place]  10/22/2020 4:53 PM EDT      Left vm for pt to call for lab results.    Theresia Bough, CMA  10/21/2020 2:23 PM EDT      [id number] (H) LMOM   Laurey Morale, MD  10/18/2020 3:40 PM EDT      LDL higher, can increase Crestor to 10 mg daily with lipids/LFTs in 2 months.

## 2020-10-23 NOTE — Telephone Encounter (Signed)
-----   Message from Laurey Morale, MD sent at 10/18/2020  3:40 PM EDT ----- LDL higher, can increase Crestor to 10 mg daily with lipids/LFTs in 2 months.

## 2020-10-23 NOTE — Progress Notes (Signed)
One time dose 100mg metoprolol tartrate for HR control for upcoming CCTA ordered.  Amena Dockham RN Navigator Cardiac Imaging [facility] [phone] Office  [phone] Cell  

## 2020-10-24 DIAGNOSIS — M5416 Radiculopathy, lumbar region: Secondary | ICD-10-CM | POA: Diagnosis not present

## 2020-10-25 ENCOUNTER — Inpatient Hospital Stay: Payer: Medicare Other | Attending: Hematology

## 2020-10-25 DIAGNOSIS — E538 Deficiency of other specified B group vitamins: Secondary | ICD-10-CM | POA: Insufficient documentation

## 2020-10-31 ENCOUNTER — Telehealth: Payer: Self-pay | Admitting: Hematology

## 2020-10-31 NOTE — Telephone Encounter (Signed)
R/s appt per 5/19 sch msg. Pt aware.  

## 2020-11-01 ENCOUNTER — Telehealth (HOSPITAL_COMMUNITY): Payer: Self-pay | Admitting: *Deleted

## 2020-11-01 ENCOUNTER — Other Ambulatory Visit: Payer: Self-pay

## 2020-11-01 ENCOUNTER — Inpatient Hospital Stay: Payer: Medicare Other

## 2020-11-01 VITALS — BP 115/81 | HR 85 | Temp 98.9°F | Resp 18

## 2020-11-01 DIAGNOSIS — D500 Iron deficiency anemia secondary to blood loss (chronic): Secondary | ICD-10-CM

## 2020-11-01 DIAGNOSIS — E538 Deficiency of other specified B group vitamins: Secondary | ICD-10-CM | POA: Diagnosis not present

## 2020-11-01 MED ORDER — CYANOCOBALAMIN 1000 MCG/ML IJ SOLN
INTRAMUSCULAR | Status: AC
  Filled 2020-11-01: qty 1

## 2020-11-01 MED ORDER — CYANOCOBALAMIN 1000 MCG/ML IJ SOLN
1000.0000 ug | Freq: Once | INTRAMUSCULAR | Status: AC
  Administered 2020-11-01: 1000 ug via INTRAMUSCULAR

## 2020-11-01 NOTE — Telephone Encounter (Signed)
Attempted to call patient regarding upcoming cardiac CT appointment. °Left message on voicemail with name and callback number ° °Rushil Kimbrell RN Navigator Cardiac Imaging °[place] [facility] °[phone] Office °[phone] Cell ° °

## 2020-11-01 NOTE — Telephone Encounter (Signed)
Pt returning call regarding upcoming cardiac imaging study; pt verbalizes understanding of appt date/time, parking situation and where to check in, pre-test NPO status and medications ordered, and verified current allergies; name and call back number provided for further questions should they arise  Larey Brick RN Navigator Cardiac Imaging [facility] [phone] office [phone] cell  Pt to take 100mg  metoprolol tartrate along with her daily medications.  Pt will hold her torsemide until after the test.

## 2020-11-04 ENCOUNTER — Other Ambulatory Visit: Payer: Self-pay

## 2020-11-04 ENCOUNTER — Encounter (HOSPITAL_COMMUNITY): Payer: Self-pay

## 2020-11-04 ENCOUNTER — Ambulatory Visit (HOSPITAL_COMMUNITY)
Admission: RE | Admit: 2020-11-04 | Discharge: 2020-11-04 | Disposition: A | Discharge status: Home / Self Care | Payer: Medicare Other | Source: Ambulatory Visit | Attending: Cardiology | Admitting: Cardiology

## 2020-11-04 DIAGNOSIS — I5022 Chronic systolic (congestive) heart failure: Secondary | ICD-10-CM | POA: Insufficient documentation

## 2020-11-04 DIAGNOSIS — R079 Chest pain, unspecified: Secondary | ICD-10-CM | POA: Diagnosis not present

## 2020-11-04 MED ORDER — NITROGLYCERIN 0.4 MG SL SUBL
SUBLINGUAL_TABLET | SUBLINGUAL | Status: AC
  Filled 2020-11-04: qty 2

## 2020-11-04 MED ORDER — IOHEXOL 350 MG/ML SOLN
100.0000 mL | Freq: Once | INTRAVENOUS | Status: AC | PRN
  Administered 2020-11-04: 95 mL via INTRAVENOUS

## 2020-11-04 MED ORDER — NITROGLYCERIN 0.4 MG SL SUBL
0.8000 mg | SUBLINGUAL_TABLET | Freq: Once | SUBLINGUAL | Status: AC
  Administered 2020-11-04: 0.8 mg via SUBLINGUAL

## 2020-11-04 MED ORDER — DILTIAZEM HCL 25 MG/5ML IV SOLN
INTRAVENOUS | Status: AC
  Filled 2020-11-04: qty 5

## 2020-11-04 MED ORDER — DILTIAZEM HCL 25 MG/5ML IV SOLN
5.0000 mg | Freq: Once | INTRAVENOUS | Status: AC
  Administered 2020-11-04: 5 mg via INTRAVENOUS

## 2020-11-04 MED ORDER — DILTIAZEM HCL 25 MG/5ML IV SOLN: 5.0000 mg | Freq: Once | INTRAVENOUS | Status: DC

## 2020-11-05 ENCOUNTER — Other Ambulatory Visit (HOSPITAL_COMMUNITY): Payer: Self-pay | Admitting: Cardiology

## 2020-11-07 ENCOUNTER — Telehealth (HOSPITAL_COMMUNITY): Payer: Self-pay | Admitting: Emergency Medicine

## 2020-11-07 DIAGNOSIS — M5136 Other intervertebral disc degeneration, lumbar region: Secondary | ICD-10-CM | POA: Diagnosis not present

## 2020-11-07 DIAGNOSIS — R079 Chest pain, unspecified: Secondary | ICD-10-CM

## 2020-11-07 DIAGNOSIS — M961 Postlaminectomy syndrome, not elsewhere classified: Secondary | ICD-10-CM | POA: Diagnosis not present

## 2020-11-07 DIAGNOSIS — Z9689 Presence of other specified functional implants: Secondary | ICD-10-CM | POA: Diagnosis not present

## 2020-11-07 NOTE — Telephone Encounter (Signed)
Pt returning my call. I explained to her that her CCTA was non-diagnostic and would need repeated. She is agreeable to this plan.   Will place new order and send to pre-cert and scheduling.  Rockwell Alexandria RN Navigator Cardiac Imaging [facility] [phone] Office  [phone] Cell

## 2020-11-07 NOTE — Telephone Encounter (Signed)
Calling patient to inform her that the CCTA she had performed this week is non-diagnostic and needs repeated if shes willing to do it again (the non-diagnostic test will not be charged).   Left vm to call me back.  Rockwell Alexandria RN Navigator Cardiac Imaging [facility] [phone] Office  [phone] Cell

## 2020-11-13 ENCOUNTER — Ambulatory Visit
Admission: RE | Admit: 2020-11-13 | Discharge: 2020-11-13 | Disposition: A | Discharge status: Home / Self Care | Payer: Medicare Other | Source: Ambulatory Visit | Attending: Family Medicine | Admitting: Family Medicine

## 2020-11-13 ENCOUNTER — Other Ambulatory Visit: Payer: Self-pay

## 2020-11-13 DIAGNOSIS — G5603 Carpal tunnel syndrome, bilateral upper limbs: Secondary | ICD-10-CM | POA: Diagnosis not present

## 2020-11-13 DIAGNOSIS — Z1231 Encounter for screening mammogram for malignant neoplasm of breast: Secondary | ICD-10-CM | POA: Diagnosis not present

## 2020-11-13 DIAGNOSIS — M79641 Pain in right hand: Secondary | ICD-10-CM | POA: Insufficient documentation

## 2020-11-18 DIAGNOSIS — G4733 Obstructive sleep apnea (adult) (pediatric): Secondary | ICD-10-CM | POA: Diagnosis not present

## 2020-11-20 DIAGNOSIS — E1140 Type 2 diabetes mellitus with diabetic neuropathy, unspecified: Secondary | ICD-10-CM | POA: Diagnosis not present

## 2020-11-20 DIAGNOSIS — L089 Local infection of the skin and subcutaneous tissue, unspecified: Secondary | ICD-10-CM | POA: Diagnosis not present

## 2020-11-21 ENCOUNTER — Other Ambulatory Visit (HOSPITAL_COMMUNITY): Payer: Self-pay | Admitting: Emergency Medicine

## 2020-11-22 ENCOUNTER — Other Ambulatory Visit: Payer: Self-pay | Admitting: Family Medicine

## 2020-11-22 ENCOUNTER — Other Ambulatory Visit: Payer: Self-pay

## 2020-11-22 ENCOUNTER — Ambulatory Visit
Admission: RE | Admit: 2020-11-22 | Discharge: 2020-11-22 | Disposition: A | Discharge status: Home / Self Care | Payer: Medicare Other | Source: Ambulatory Visit | Attending: Family Medicine | Admitting: Family Medicine

## 2020-11-22 DIAGNOSIS — L089 Local infection of the skin and subcutaneous tissue, unspecified: Secondary | ICD-10-CM | POA: Diagnosis not present

## 2020-11-22 DIAGNOSIS — M7989 Other specified soft tissue disorders: Secondary | ICD-10-CM | POA: Diagnosis not present

## 2020-11-25 ENCOUNTER — Other Ambulatory Visit (HOSPITAL_COMMUNITY): Payer: Self-pay | Admitting: *Deleted

## 2020-11-25 ENCOUNTER — Telehealth (HOSPITAL_COMMUNITY): Payer: Self-pay | Admitting: *Deleted

## 2020-11-25 DIAGNOSIS — R079 Chest pain, unspecified: Secondary | ICD-10-CM

## 2020-11-25 DIAGNOSIS — L089 Local infection of the skin and subcutaneous tissue, unspecified: Secondary | ICD-10-CM | POA: Diagnosis not present

## 2020-11-25 DIAGNOSIS — E1140 Type 2 diabetes mellitus with diabetic neuropathy, unspecified: Secondary | ICD-10-CM | POA: Diagnosis not present

## 2020-11-25 MED ORDER — METOPROLOL TARTRATE 100 MG PO TABS: 100.0000 mg | ORAL_TABLET | Freq: Once | ORAL | 0 refills | Status: DC

## 2020-11-25 NOTE — Telephone Encounter (Signed)
Pt returning call regarding upcoming cardiac imaging study; pt verbalizes understanding of appt date/time, parking situation and where to check in, pre-test NPO status and medications ordered, and verified current allergies; name and call back number provided for further questions should they arise  Larey Brick RN Navigator Cardiac Imaging [facility] [phone] office [phone] cell  Pt aware of need for blood work but will be unable to get blood work prior to scan.  Pt informed that we can check creatine on day of test with a increased cost. Pt verbalized understanding.  Pt to take 100mg  metoprolol tartrate 2 hours prior to cardiac CT scan.

## 2020-11-25 NOTE — Telephone Encounter (Signed)
Attempted to call patient regarding upcoming cardiac CT appointment. °Left message on voicemail with name and callback number ° °Lunette Tapp RN Navigator Cardiac Imaging °[facility] [facility] °[phone] Office °[phone] Cell ° °

## 2020-11-26 ENCOUNTER — Encounter: Payer: Self-pay | Admitting: Hematology

## 2020-11-27 ENCOUNTER — Ambulatory Visit (HOSPITAL_COMMUNITY)
Admission: RE | Admit: 2020-11-27 | Discharge: 2020-11-27 | Disposition: A | Discharge status: Home / Self Care | Payer: Medicare Other | Source: Ambulatory Visit | Attending: Cardiology | Admitting: Cardiology

## 2020-11-27 ENCOUNTER — Other Ambulatory Visit: Payer: Self-pay

## 2020-11-27 DIAGNOSIS — R079 Chest pain, unspecified: Secondary | ICD-10-CM | POA: Diagnosis not present

## 2020-11-27 DIAGNOSIS — I2510 Atherosclerotic heart disease of native coronary artery without angina pectoris: Secondary | ICD-10-CM | POA: Diagnosis not present

## 2020-11-27 DIAGNOSIS — I2584 Coronary atherosclerosis due to calcified coronary lesion: Secondary | ICD-10-CM | POA: Diagnosis not present

## 2020-11-27 LAB — POCT I-STAT CREATININE: Creatinine, Ser: 0.8 mg/dL (ref 0.44–1.00)

## 2020-11-27 MED ORDER — DILTIAZEM HCL 25 MG/5ML IV SOLN: 5.0000 mg | Freq: Once | INTRAVENOUS | Status: AC

## 2020-11-27 MED ORDER — IOHEXOL 350 MG/ML SOLN
95.0000 mL | Freq: Once | INTRAVENOUS | Status: AC | PRN
  Administered 2020-11-27: 95 mL via INTRAVENOUS

## 2020-11-27 MED ORDER — NITROGLYCERIN 0.4 MG SL SUBL
SUBLINGUAL_TABLET | SUBLINGUAL | Status: AC
  Filled 2020-11-27: qty 2

## 2020-11-27 MED ORDER — DILTIAZEM HCL 25 MG/5ML IV SOLN
INTRAVENOUS | Status: AC
  Administered 2020-11-27: 5 mg via INTRAVENOUS
  Filled 2020-11-27: qty 5

## 2020-11-27 MED ORDER — METOPROLOL TARTRATE 5 MG/5ML IV SOLN
INTRAVENOUS | Status: AC
  Filled 2020-11-27: qty 10

## 2020-11-27 MED ORDER — NITROGLYCERIN 0.4 MG SL SUBL
0.8000 mg | SUBLINGUAL_TABLET | Freq: Once | SUBLINGUAL | Status: AC
  Administered 2020-11-27: 0.8 mg via SUBLINGUAL

## 2020-11-27 MED ORDER — DILTIAZEM HCL 25 MG/5ML IV SOLN: 5.0000 mg | Freq: Once | INTRAVENOUS | Status: DC

## 2020-11-27 MED ORDER — METOPROLOL TARTRATE 5 MG/5ML IV SOLN: 5.0000 mg | INTRAVENOUS | Status: DC | PRN

## 2020-11-27 NOTE — Progress Notes (Signed)
I Stat creatinine, 2 tests unable to "position" and one test Cartridge error #43. Successful 4th try and creatinine 0.8. Blood clotted in syringe almost immediately with first 2 tests.

## 2020-11-29 ENCOUNTER — Inpatient Hospital Stay: Payer: Medicare Other

## 2020-11-29 ENCOUNTER — Inpatient Hospital Stay: Payer: Medicare Other | Attending: Hematology

## 2020-11-29 ENCOUNTER — Other Ambulatory Visit: Payer: Self-pay

## 2020-11-29 DIAGNOSIS — K219 Gastro-esophageal reflux disease without esophagitis: Secondary | ICD-10-CM | POA: Insufficient documentation

## 2020-11-29 DIAGNOSIS — J019 Acute sinusitis, unspecified: Secondary | ICD-10-CM | POA: Insufficient documentation

## 2020-11-29 DIAGNOSIS — D500 Iron deficiency anemia secondary to blood loss (chronic): Secondary | ICD-10-CM | POA: Insufficient documentation

## 2020-11-29 DIAGNOSIS — R0600 Dyspnea, unspecified: Secondary | ICD-10-CM | POA: Insufficient documentation

## 2020-11-29 DIAGNOSIS — R600 Localized edema: Secondary | ICD-10-CM | POA: Insufficient documentation

## 2020-11-29 DIAGNOSIS — I509 Heart failure, unspecified: Secondary | ICD-10-CM | POA: Insufficient documentation

## 2020-11-29 DIAGNOSIS — Z79899 Other long term (current) drug therapy: Secondary | ICD-10-CM | POA: Insufficient documentation

## 2020-11-29 DIAGNOSIS — Z9884 Bariatric surgery status: Secondary | ICD-10-CM

## 2020-11-29 DIAGNOSIS — R635 Abnormal weight gain: Secondary | ICD-10-CM | POA: Insufficient documentation

## 2020-11-29 DIAGNOSIS — L03039 Cellulitis of unspecified toe: Secondary | ICD-10-CM | POA: Insufficient documentation

## 2020-11-29 DIAGNOSIS — N920 Excessive and frequent menstruation with regular cycle: Secondary | ICD-10-CM | POA: Diagnosis not present

## 2020-11-29 DIAGNOSIS — J069 Acute upper respiratory infection, unspecified: Secondary | ICD-10-CM | POA: Insufficient documentation

## 2020-11-29 DIAGNOSIS — L97309 Non-pressure chronic ulcer of unspecified ankle with unspecified severity: Secondary | ICD-10-CM | POA: Insufficient documentation

## 2020-11-29 DIAGNOSIS — J4520 Mild intermittent asthma, uncomplicated: Secondary | ICD-10-CM | POA: Insufficient documentation

## 2020-11-29 DIAGNOSIS — Z0000 Encounter for general adult medical examination without abnormal findings: Secondary | ICD-10-CM | POA: Insufficient documentation

## 2020-11-29 DIAGNOSIS — T464X5A Adverse effect of angiotensin-converting-enzyme inhibitors, initial encounter: Secondary | ICD-10-CM | POA: Insufficient documentation

## 2020-11-29 DIAGNOSIS — R202 Paresthesia of skin: Secondary | ICD-10-CM | POA: Insufficient documentation

## 2020-11-29 DIAGNOSIS — A5901 Trichomonal vulvovaginitis: Secondary | ICD-10-CM | POA: Insufficient documentation

## 2020-11-29 DIAGNOSIS — Z9229 Personal history of other drug therapy: Secondary | ICD-10-CM | POA: Insufficient documentation

## 2020-11-29 DIAGNOSIS — G453 Amaurosis fugax: Secondary | ICD-10-CM | POA: Insufficient documentation

## 2020-11-29 DIAGNOSIS — M2550 Pain in unspecified joint: Secondary | ICD-10-CM | POA: Insufficient documentation

## 2020-11-29 DIAGNOSIS — H6120 Impacted cerumen, unspecified ear: Secondary | ICD-10-CM | POA: Insufficient documentation

## 2020-11-29 DIAGNOSIS — N898 Other specified noninflammatory disorders of vagina: Secondary | ICD-10-CM | POA: Insufficient documentation

## 2020-11-29 DIAGNOSIS — E538 Deficiency of other specified B group vitamins: Secondary | ICD-10-CM | POA: Diagnosis not present

## 2020-11-29 DIAGNOSIS — IMO0002 Reserved for concepts with insufficient information to code with codable children: Secondary | ICD-10-CM | POA: Insufficient documentation

## 2020-11-29 DIAGNOSIS — L089 Local infection of the skin and subcutaneous tissue, unspecified: Secondary | ICD-10-CM | POA: Diagnosis not present

## 2020-11-29 DIAGNOSIS — B351 Tinea unguium: Secondary | ICD-10-CM | POA: Insufficient documentation

## 2020-11-29 DIAGNOSIS — R609 Edema, unspecified: Secondary | ICD-10-CM | POA: Insufficient documentation

## 2020-11-29 DIAGNOSIS — E785 Hyperlipidemia, unspecified: Secondary | ICD-10-CM | POA: Insufficient documentation

## 2020-11-29 DIAGNOSIS — Z23 Encounter for immunization: Secondary | ICD-10-CM | POA: Insufficient documentation

## 2020-11-29 DIAGNOSIS — D649 Anemia, unspecified: Secondary | ICD-10-CM | POA: Insufficient documentation

## 2020-11-29 DIAGNOSIS — F32A Depression, unspecified: Secondary | ICD-10-CM | POA: Insufficient documentation

## 2020-11-29 DIAGNOSIS — K5909 Other constipation: Secondary | ICD-10-CM | POA: Insufficient documentation

## 2020-11-29 DIAGNOSIS — E559 Vitamin D deficiency, unspecified: Secondary | ICD-10-CM | POA: Insufficient documentation

## 2020-11-29 DIAGNOSIS — L739 Follicular disorder, unspecified: Secondary | ICD-10-CM | POA: Insufficient documentation

## 2020-11-29 DIAGNOSIS — L02211 Cutaneous abscess of abdominal wall: Secondary | ICD-10-CM | POA: Insufficient documentation

## 2020-11-29 DIAGNOSIS — J309 Allergic rhinitis, unspecified: Secondary | ICD-10-CM | POA: Insufficient documentation

## 2020-11-29 LAB — CBC WITH DIFFERENTIAL/PLATELET
Abs Immature Granulocytes: 0.02 10*3/uL (ref 0.00–0.07)
Basophils Absolute: 0.1 10*3/uL (ref 0.0–0.1)
Basophils Relative: 1 %
Eosinophils Absolute: 0.3 10*3/uL (ref 0.0–0.5)
Eosinophils Relative: 3 %
HCT: 42.4 % (ref 36.0–46.0)
Hemoglobin: 13.8 g/dL (ref 12.0–15.0)
Immature Granulocytes: 0 %
Lymphocytes Relative: 26 %
Lymphs Abs: 2.3 10*3/uL (ref 0.7–4.0)
MCH: 29.5 pg (ref 26.0–34.0)
MCHC: 32.5 g/dL (ref 30.0–36.0)
MCV: 90.6 fL (ref 80.0–100.0)
Monocytes Absolute: 0.4 10*3/uL (ref 0.1–1.0)
Monocytes Relative: 5 %
Neutro Abs: 5.8 10*3/uL (ref 1.7–7.7)
Neutrophils Relative %: 65 %
Platelets: 234 10*3/uL (ref 150–400)
RBC: 4.68 MIL/uL (ref 3.87–5.11)
RDW: 12.6 % (ref 11.5–15.5)
WBC: 8.9 10*3/uL (ref 4.0–10.5)
nRBC: 0 % (ref 0.0–0.2)

## 2020-11-29 LAB — VITAMIN B12: Vitamin B-12: 586 pg/mL (ref 180–914)

## 2020-11-29 LAB — IRON AND TIBC
Iron: 53 ug/dL (ref 28–170)
Saturation Ratios: 13 % (ref 10.4–31.8)
TIBC: 394 ug/dL (ref 250–450)
UIBC: 341 ug/dL

## 2020-11-29 LAB — FERRITIN: Ferritin: 27 ng/mL (ref 11–307)

## 2020-11-29 MED ORDER — CYANOCOBALAMIN 1000 MCG/ML IJ SOLN
1000.0000 ug | Freq: Once | INTRAMUSCULAR | Status: AC
  Administered 2020-11-29: 1000 ug via INTRAMUSCULAR

## 2020-11-29 NOTE — Patient Instructions (Signed)

## 2020-12-02 ENCOUNTER — Ambulatory Visit (INDEPENDENT_AMBULATORY_CARE_PROVIDER_SITE_OTHER): Payer: Medicare Other | Admitting: Podiatrist

## 2020-12-02 ENCOUNTER — Encounter: Payer: Self-pay | Admitting: Podiatrist

## 2020-12-02 ENCOUNTER — Other Ambulatory Visit: Payer: Self-pay

## 2020-12-02 DIAGNOSIS — L089 Local infection of the skin and subcutaneous tissue, unspecified: Secondary | ICD-10-CM

## 2020-12-02 NOTE — Patient Instructions (Signed)

## 2020-12-02 NOTE — Progress Notes (Signed)
Chief Complaint  [name] presents with   Foot Problem    Right second toe possible infection. Thickened skin/peeling. Reports drainage/swelling/numbness-no pain/odor when peeled. Also reports areas starting on left/right hallux's. Tx: soaks, 7-10 day abx.      HPI: [name] is 50 y.o. female who presents today for the concerns as listed above. She relates she noticed some swollen and thickened skin on the tip of the right second toe and relates it had pus under it with an odor.  She was treated with antibiotics and relates improvement in the appearance of the toe.  She has neuropathy and little feeling at her feet.    [name] Active Problem List   Diagnosis Date Noted   Abnormal weight gain 11/29/2020   Absolute anemia 11/29/2020   Accumulation of fluid in tissues 11/29/2020   Acid reflux 11/29/2020   Acute infection of nasal sinus 11/29/2020   Adverse effect of angiotensin-converting enzyme inhibitor 11/29/2020   AF (amaurosis fugax) 11/29/2020   Allergic rhinitis 11/29/2020   Ankle ulcer (HCC) 11/29/2020   Asthma, mild intermittent 11/29/2020   Avitaminosis D 11/29/2020   Burning or prickling sensation 11/29/2020   CCF (congestive cardiac failure) (HCC) 11/29/2020   Cerumen impaction 11/29/2020   Clinical depression 11/29/2020   Chronic constipation 11/29/2020   Diabetes mellitus type 2, uncontrolled (HCC) 11/29/2020   Discharge from the vagina 11/29/2020   Dryness of vagina 11/29/2020   Edema leg 11/29/2020   Encounter for general adult medical examination without abnormal findings 11/29/2020   Folliculitis 11/29/2020   Fungal infection of toenail 11/29/2020   HLD (hyperlipidemia) 11/29/2020   Infection of the upper respiratory tract 11/29/2020   Abdominal wall abscess 11/29/2020   Ache in joint 11/29/2020   Need for vaccination 11/29/2020   Nocturnal dyspnea 11/29/2020   Panaritium of toe 11/29/2020   Polypharmacy 11/29/2020   Received influenza vaccination at  hospital 11/29/2020   Trichomonal fluor vaginalis 11/29/2020   Pain in right hand 11/13/2020   Elevated blood-pressure reading, without diagnosis of hypertension 01/31/2020   Failed back syndrome of lumbar spine 01/31/2020   Spinal cord stimulator status 08/16/2019   Opioid dependence (HCC) 02/23/2019   Essential hypertension 12/18/2018   Syncope 08/01/2018   AKI (acute kidney injury) (HCC) 08/01/2018   Menorrhagia with irregular cycle 06/02/2018   Palpitations 08/18/2017   Hypokalemia 08/18/2017   Elevated LFTs 12/24/2015   OSA (obstructive sleep apnea) 02/21/2015   Excessive daytime sleepiness 11/29/2014   Snoring 11/29/2014   Goiter 09/06/2014   Fatigue 09/06/2014   Chronic systolic CHF (congestive heart failure) (HCC) 05/31/2014   Open breast wound 12/26/2013   Breast injury 12/05/2013   Left breast abscess 11/26/2013   Chronic systolic heart failure (HCC) 11/03/2013   History of iron deficiency anemia 10/12/2013   Lumbosacral radiculitis 08/22/2013   Carpal tunnel syndrome 06/26/2013   Back pain, chronic 06/26/2013   Idiopathic peripheral neuropathy 06/26/2013   History of gastric bypass 03/13/2012   Anxiety disorder 03/13/2012   Nervous breakdown 03/13/2012   Hematochezia 03/13/2012   Iron deficiency anemia due to chronic blood loss 03/13/2012   DM type 2 (diabetes mellitus, type 2) (HCC) 03/13/2012   Generalized weakness 03/13/2012   Generalized abdominal pain 03/13/2012   Hyperactive bowel sounds 03/13/2012   Dehydration 03/13/2012   Insomnia 03/13/2012   Diabetic neuropathy (HCC) 03/13/2012   Migraine, unspecified, without mention of intractable migraine without mention of status migrainosus 03/13/2012   Major depressive disorder, recurrent episode, moderate (HCC) 02/18/2012  Class: Acute   Compulsive behavior disorder (HCC) 02/18/2012    Class: Chronic   Nausea and vomiting 07/21/2011   Personal history of surgery to heart and great vessels, presenting  hazards to health 06/30/2011   Morbid obesity (HCC) 06/25/2011   IDDM (insulin dependent diabetes mellitus) 06/25/2011   Degenerative disc disease 06/25/2011    Current Outpatient Medications on File Prior to Visit  Medication Sig Dispense Refill   ACCU-CHEK GUIDE test strip 1 each by Other route 3 (three) times daily.     albuterol (VENTOLIN HFA) 108 (90 Base) MCG/ACT inhaler Inhale 2 puffs into the lungs every 4 (four) hours as needed for wheezing or shortness of breath.     Biotin 5000 MCG TABS Take 5,000 mcg by mouth daily.     Cholecalciferol (VITAMIN D3) 5000 units TABS Take 5,000 Units by mouth daily.      ciprofloxacin (CIPRO) 250 MG tablet Take 250 mg by mouth 2 (two) times daily.     cloNIDine (CATAPRES) 0.1 MG tablet Take 0.1 mg by mouth 2 (two) times daily.     CLONIDINE HCL PO Take 1 tablet by mouth 2 (two) times daily.     cyclobenzaprine (FLEXERIL) 10 MG tablet Take 10 mg by mouth 3 (three) times daily as needed for muscle spasms.      diclofenac sodium (VOLTAREN) 1 % GEL Apply 2 g topically 2 (two) times daily.     diclofenac Sodium (VOLTAREN) 1 % GEL diclofenac 1 % topical gel     DULoxetine (CYMBALTA) 30 MG capsule duloxetine 30 mg capsule,delayed release  TAKE 3 CAPSULES BY MOUTH EVERY DAY FOR DEPRESSION AND PAIN     DULoxetine (CYMBALTA) 60 MG capsule Take 60 mg by mouth daily.     ENTRESTO 24-26 MG TAKE 1 TABLET BY MOUTH TWICE A DAY 180 tablet 3   famotidine (PEPCID) 20 MG tablet Take 20 mg by mouth daily.     fluconazole (DIFLUCAN) 100 MG tablet Take 100 mg by mouth daily.     fluconazole (DIFLUCAN) 150 MG tablet Take 1 tablet by mouth daily.     fluticasone (FLONASE) 50 MCG/ACT nasal spray Place 1 spray into both nostrils daily.      ibuprofen (ADVIL,MOTRIN) 800 MG tablet Take 800 mg by mouth every 8 (eight) hours as needed (pain).     insulin glargine (LANTUS) 100 UNIT/ML injection Inject 50 Units into the skin daily.     JARDIANCE 10 MG TABS tablet Take 10 mg by  mouth daily.     liraglutide (VICTOZA) 18 MG/3ML SOPN Victoza 3-Pak 0.6 mg/0.1 mL (18 mg/3 mL) subcutaneous pen injector  INJECT 1.8 MG SUBCUTANEOUSLY DAILY     lubiprostone (AMITIZA) 24 MCG capsule Take 24 mcg by mouth 2 (two) times daily.     metoprolol succinate (TOPROL-XL) 50 MG 24 hr tablet TAKE 1 TABLET BY MOUTH EVERY DAY 90 tablet 3   metoprolol tartrate (LOPRESSOR) 100 MG tablet Take 1 tablet (100 mg total) by mouth once for 1 dose. Please take one time dose 100mg  metoprolol tartrate 2 hr prior to cardiac CT for HR control IF HR >55bpm. 1 tablet 0   nystatin ointment (MYCOSTATIN) nystatin 100,000 unit/gram topical ointment  APPLY TO AFFECTED AREA TWICE A DAY TO RASH     ondansetron (ZOFRAN) 4 MG tablet TAKE 1 TABLET BY MOUTH EVERY 8 HOURS AS NEEDED FOR NAUSEA AND VOMITING 20 tablet 0   oxyCODONE-acetaminophen (PERCOCET) 10-325 MG per tablet Take 0.5-1 tablets by mouth  every 4 (four) hours as needed for pain.     pregabalin (LYRICA) 150 MG capsule Take 150 mg by mouth 3 (three) times daily.     pregabalin (LYRICA) 150 MG capsule Take by mouth.     promethazine (PHENERGAN) 25 MG tablet promethazine 25 mg tablet  TAKE 1/2 TO 1 TABLET BY MOUTH EVERY 6 HOURS AS NEEDED FOR NAUSEA     rosuvastatin (CRESTOR) 10 MG tablet Take 1 tablet (10 mg total) by mouth daily. 90 tablet 3   Semaglutide (OZEMPIC, 1 MG/DOSE, ) Inject 1 Dose into the skin once a week.     solifenacin (VESICARE) 5 MG tablet solifenacin 5 mg tablet  TAKE 1 (ONE) TABLET DAILY FOR BLADDER CONTROL     torsemide (DEMADEX) 20 MG tablet Take 2 tablets (40 mg total) by mouth daily. [name] needs to make an appointment for future refills 180 tablet 0   No current facility-administered medications on file prior to visit.    Allergies  Allergen Reactions   Imitrex [Sumatriptan] Other (See Comments)    Acid reflux---pill only-- shots are okay    Lamictal [Lamotrigine] Nausea And Vomiting    Review of Systems No fevers, chills,  nausea, muscle aches, no difficulty breathing, no calf pain, no chest pain or shortness of breath.   Physical Exam  GENERAL APPEARANCE: Alert, conversant. Appropriately groomed. No acute distress.   VASCULAR: Pedal pulses palpable DP and PT bilateral.  Capillary refill time is immediate to all digits,  Proximal to distal cooling it warm to warm.  Digital perfusion adequate with hair growth noted.   NEUROLOGIC: sensation is decreased to 5.07 monofilament at 0/5 sites bilateral.  Light touch is absent bilateral, vibratory sensation absent bilateral  MUSCULOSKELETAL: acceptable muscle strength, tone and stability bilateral.  No gross boney pedal deformities noted.  No pain, crepitus or limitation noted with foot and ankle range of motion bilateral.   DERMATOLOGIC: skin is warm, supple, and dry.  An area of darkened, thick and peeling skin is present at the distal tip of the right second toe.  With debridement, the integument beneath is intact.  Digital nails are painted.  No interdigital maceration present.  Area of callus present on the left hallux, medial side.     Assessment     ICD-10-CM   1. Infection of skin of toes  L08.9        Plan  Discussed exam findings with the [name].  It appears the infection that was present on the right second toe has resolved with the oral antibiotics and soaks.  I pared the thickened skin to a safe level of intact integument.  No ulcer or pocket of infection was encountered.  - I recommended she use vaseline on the skin of the toe to help aid healing.   -she will return in 6 months for a routine diabetic foot evaluation.  If any concerns arise prior to that visit she will call

## 2020-12-03 ENCOUNTER — Telehealth: Payer: Self-pay | Admitting: *Deleted

## 2020-12-03 ENCOUNTER — Telehealth: Payer: Self-pay | Admitting: Hematology

## 2020-12-03 ENCOUNTER — Encounter: Payer: Self-pay | Admitting: Gastroenterology

## 2020-12-03 NOTE — Telephone Encounter (Signed)
Called pt & informed of lab results per Dr Mosetta Putt.  Pt is agreeable to get IV iron.  Message to schedulers & auth team to check for insurance approval.

## 2020-12-03 NOTE — Telephone Encounter (Signed)
-----   Message from Malachy Mood, MD sent at 12/01/2020 11:05 PM EDT ----- Please let pt know her lab results, B12 level good, iron level below goal, please set up one dose Feraheme (check her insurance approval, last dose in 07/2019), thanks  Malachy Mood  12/01/2020

## 2020-12-03 NOTE — Telephone Encounter (Signed)
Scheduled appt per 6/21 sch msg. Pt aware.  

## 2020-12-04 LAB — METHYLMALONIC ACID, SERUM: Methylmalonic Acid, Quantitative: 138 nmol/L (ref 0–378)

## 2020-12-06 DIAGNOSIS — I2510 Atherosclerotic heart disease of native coronary artery without angina pectoris: Secondary | ICD-10-CM | POA: Diagnosis not present

## 2020-12-06 DIAGNOSIS — R931 Abnormal findings on diagnostic imaging of heart and coronary circulation: Secondary | ICD-10-CM | POA: Diagnosis not present

## 2020-12-13 DIAGNOSIS — D509 Iron deficiency anemia, unspecified: Secondary | ICD-10-CM | POA: Diagnosis not present

## 2020-12-13 DIAGNOSIS — E785 Hyperlipidemia, unspecified: Secondary | ICD-10-CM | POA: Diagnosis not present

## 2020-12-13 DIAGNOSIS — E1129 Type 2 diabetes mellitus with other diabetic kidney complication: Secondary | ICD-10-CM | POA: Diagnosis not present

## 2020-12-13 DIAGNOSIS — Z794 Long term (current) use of insulin: Secondary | ICD-10-CM | POA: Diagnosis not present

## 2020-12-13 DIAGNOSIS — R809 Proteinuria, unspecified: Secondary | ICD-10-CM | POA: Diagnosis not present

## 2020-12-13 DIAGNOSIS — D510 Vitamin B12 deficiency anemia due to intrinsic factor deficiency: Secondary | ICD-10-CM | POA: Diagnosis not present

## 2020-12-17 ENCOUNTER — Telehealth: Payer: Self-pay | Admitting: Hematology

## 2020-12-17 NOTE — Telephone Encounter (Signed)
Moved appt per 7/2 sch msg. Pt aware.

## 2020-12-18 DIAGNOSIS — G4733 Obstructive sleep apnea (adult) (pediatric): Secondary | ICD-10-CM | POA: Diagnosis not present

## 2020-12-23 ENCOUNTER — Telehealth: Payer: Self-pay

## 2020-12-23 ENCOUNTER — Inpatient Hospital Stay: Payer: Medicare Other | Attending: Hematology

## 2020-12-23 ENCOUNTER — Telehealth: Payer: Self-pay | Admitting: Hematology

## 2020-12-23 DIAGNOSIS — N920 Excessive and frequent menstruation with regular cycle: Secondary | ICD-10-CM | POA: Insufficient documentation

## 2020-12-23 DIAGNOSIS — D500 Iron deficiency anemia secondary to blood loss (chronic): Secondary | ICD-10-CM | POA: Insufficient documentation

## 2020-12-23 DIAGNOSIS — E538 Deficiency of other specified B group vitamins: Secondary | ICD-10-CM | POA: Insufficient documentation

## 2020-12-23 NOTE — Telephone Encounter (Signed)
Patient did not show for her 0800 infusion appointment. Called cell number listed in record. No answer. Left message that we would reschedule appt. Message sent to scheduling.

## 2020-12-23 NOTE — Telephone Encounter (Signed)
Scheduled appointment per 07/11 sch msg. Patient is aware. 

## 2020-12-25 ENCOUNTER — Encounter: Payer: Self-pay | Admitting: Gastroenterology

## 2020-12-25 ENCOUNTER — Ambulatory Visit (INDEPENDENT_AMBULATORY_CARE_PROVIDER_SITE_OTHER): Payer: Medicare Other | Admitting: Gastroenterology

## 2020-12-25 VITALS — BP 136/84 | HR 84 | Ht 65.5 in | Wt 189.0 lb

## 2020-12-25 DIAGNOSIS — R1030 Lower abdominal pain, unspecified: Secondary | ICD-10-CM | POA: Diagnosis not present

## 2020-12-25 NOTE — Patient Instructions (Signed)
If you are age 50 or younger, your body mass index should be between 19-25. Your Body mass index is 30.97 kg/m. If this is out of the aformentioned range listed, please consider follow up with your Primary Care Provider.  __________________________________________________________  The [facility] providers would like to encourage you to use [facility] to communicate with providers for non-urgent requests or questions.  Due to long hold times on the telephone, sending your provider a message by [facility] may be a faster and more efficient way to get a response.  Please allow 48 business hours for a response.  Please remember that this is for non-urgent requests.  __________________________________________________________  Bonita Quin will be due for recall colonoscopy in October 2023 with Dr Myrtie Neither. We will contact you to schedule.  Please contact our office if your lower abdominal pain gets worse.  Thank you for entrusting me with your care and choosing [facility].  Dr Christella Hartigan

## 2020-12-25 NOTE — Progress Notes (Signed)
HPI: This is a pleasant 50 year old woman who was referred to me by Hamrick, Lorin Mercy, MD  to discuss a colonoscopy for colon cancer screening  She is an established patient with Dr. Wilfrid Lund here in our office.  She was scheduled with me inadvertently.   Her last office visit visit here with Dr. Loletha Carrow was about 5 years ago at which time he was helping her with elevated liver tests.  He did not feel that she had autoimmune liver disease.  He ordered some extra blood work and was planning to have her follow-up in the office in 6 months.  Repeat liver testing November 2017 showed normal transaminases, her alk phos was 146, anti-smooth muscle antibody was negative.  Repeat LFTs done by different provider July 2018 showed AST 37, ALT 100, alk phos 213.    Her gallbladder was removed remotely.  I reviewed a packet of information from her primary care physician at [facility] family practice in [place] from an office visit April 2022.  That included some blood work from March 2022.  Her alk phos was 187, her AST was 65, her ALT was 99.  Her CBC was completely normal.  It looks like she had been started on Ozempic March 2022  She was referred by her pcp for a colonoscopy for colon cancer screening  Through epic I was able to see that she had a colonoscopy October 2013 with Dr. Arta Silence when she was hospitalized.  This was done for abdominal pains.  No polyps or cancers were noted.  Today she tells me that she was recommended to have a colonoscopy for colon cancer screening.  She has no overt GI bleeding, no significant constipation or diarrhea.  Her weight is overall stable.  Colon cancer does not run in her family  She is bothered by very minor intermittent lower abdominal discomforts that occur about twice per month and last for just a few seconds.  They are sharp pains.  Nothing tends to bring them on regularly, they can occur whether sitting or standing.  Nothing that she does makes  them go away but they only last for a few seconds.  She has a variety of back, full body pains.  She tells me she has fibromyalgia, pains in her lower extremities due to her diabetes.  She is on chronic narcotic pain medicines with Percocets.  10 mg narcotic component.  She tells me she takes at least 1 of these a day sometimes more.  This will depend on how better pain is and that seems to wax and wane with weather patterns.   Review of systems: Pertinent positive and negative review of systems were noted in the above HPI section. All other review negative.   Past Medical History:  Diagnosis Date   Anemia    hx of iron transfusions - last one 10/2013    Anxiety    Arthritis    neck and shoulders    Asthma    Back pain, chronic    Bipolar disorder (HCC)    Carpal tunnel syndrome on both sides    Cataract    Depression    Diabetes mellitus    Fibromyalgia    GERD (gastroesophageal reflux disease)    History of gastric bypass Jan 2013   Hyperlipidemia    Hypertension    Insomnia    Migraines    Nervous breakdown sept 2013   Neuropathy    Nonischemic cardiomyopathy ([place])  OSA (obstructive sleep apnea) 02/21/2015   Peripheral vascular disease (HCC)    Shortness of breath    with exertion    Sleep apnea    borderline- no cpap yet   Systolic CHF, acute on chronic ([place])    CARDIOLOGIST-- DR Ena Dawley  ([place])   Wound of left breast     Past Surgical History:  Procedure Laterality Date   BACK SURGERY  04/1991   BREAST CYST ASPIRATION Left    BREAST SURGERY Left    I&D for abcsess    CARDIAC CATHETERIZATION  11-14-2013  DR DALTON MCLEAN   NORMAL LV FILLING PRESSURE W/ MILD ELEVATED RV FILLING, POSSIBLY SUFFESTING RV FAILURE OUT OF PROPORTION TO LV FAILURE. EF ABOUT 35% WITH DIFFUSE HYPOKINESIS. NO ANGIOGRAPHIC CAD, NONISCHEMIC CARDIOMYOPATHY   CARPAL TUNNEL RELEASE Bilateral    CATARACT EXTRACTION Right    CESAREAN SECTION  05/1997   CHOLECYSTECTOMY  1998    DILITATION & CURRETTAGE/HYSTROSCOPY WITH NOVASURE ABLATION N/A 02/26/2014   Procedure: DILATATION & CURETTAGE/HYSTEROSCOPY WITH NOVASURE ABLATION;  Surgeon: Margarette Asal, MD;  Location: [facility];  Service: Gynecology;  Laterality: N/A;   ESOPHAGOGASTRODUODENOSCOPY  07/27/2011   Procedure: ESOPHAGOGASTRODUODENOSCOPY (EGD);  Surgeon: Landry Dyke, MD;  Location: [facility];  Service: Endoscopy;  Laterality: N/A;   GASTRIC ROUX-EN-Y  06/30/2011   Procedure: LAPAROSCOPIC ROUX-EN-Y GASTRIC;  Surgeon: Edward Jolly, MD;  Location: [facility];  Service: General;  Laterality: N/A;      INCISION AND DRAINAGE OF WOUND Left 12/14/2013   Procedure: IRRIGATION AND DEBRIDEMENT OF LEFT BREAST WOUND WITH PLACEMENT OF A-CELL, WITH CLOSURE ;  Surgeon: Theodoro Kos, DO;  Location: [facility];  Service: Plastics;  Laterality: Left;   IRRIGATION AND DEBRIDEMENT ABSCESS Left 11/26/2013   Procedure: IRRIGATION AND DEBRIDEMENT ABSCESS;  Surgeon: Earnstine Regal, MD;  Location: [facility];  Service: General;  Laterality: Left;   LEFT AND RIGHT HEART CATHETERIZATION WITH CORONARY ANGIOGRAM N/A 11/14/2013   Procedure: LEFT AND RIGHT HEART CATHETERIZATION WITH CORONARY ANGIOGRAM;  Surgeon: Larey Dresser, MD;  Location: [facility];  Service: Cardiovascular;  Laterality: N/A;   SPINAL CORD STIMULATOR INSERTION N/A 03/09/2014   Procedure: LUMBAR SPINAL CORD STIMULATOR INSERTION;  Surgeon: Bonna Gains, MD;  Location: [facility];  Service: Neurosurgery;  Laterality: N/A;   TUBAL LIGATION  06/1997    Current Outpatient Medications  Medication Sig Dispense Refill   ACCU-CHEK GUIDE test strip 1 each by Other route 3 (three) times daily.     albuterol (VENTOLIN HFA) 108 (90 Base) MCG/ACT inhaler Inhale 2 puffs into the lungs every 4 (four) hours as needed for wheezing or shortness of breath.     Biotin 5000 MCG TABS Take 5,000 mcg by mouth daily.     Cholecalciferol (VITAMIN D3) 5000 units TABS Take 5,000 Units by mouth  daily.      cloNIDine (CATAPRES) 0.1 MG tablet Take 0.1 mg by mouth 2 (two) times daily.     cyclobenzaprine (FLEXERIL) 10 MG tablet Take 10 mg by mouth 3 (three) times daily as needed for muscle spasms.      diclofenac sodium (VOLTAREN) 1 % GEL Apply 2 g topically 2 (two) times daily.     DULoxetine (CYMBALTA) 30 MG capsule Take 30 mg by mouth daily.     DULoxetine (CYMBALTA) 60 MG capsule Take 60 mg by mouth daily.     ENTRESTO 24-26 MG TAKE 1 TABLET BY MOUTH TWICE A DAY 180 tablet 3  famotidine (PEPCID) 20 MG tablet Take 20 mg by mouth daily.     fluconazole (DIFLUCAN) 150 MG tablet Take 1 tablet by mouth daily.     fluticasone (FLONASE) 50 MCG/ACT nasal spray Place 1 spray into both nostrils daily.      ibuprofen (ADVIL,MOTRIN) 800 MG tablet Take 800 mg by mouth every 8 (eight) hours as needed (pain).     insulin glargine (LANTUS) 100 UNIT/ML injection Inject 50 Units into the skin daily.     JARDIANCE 10 MG TABS tablet Take 10 mg by mouth daily.     lubiprostone (AMITIZA) 24 MCG capsule Take 24 mcg by mouth 2 (two) times daily.     metoprolol succinate (TOPROL-XL) 50 MG 24 hr tablet TAKE 1 TABLET BY MOUTH EVERY DAY 90 tablet 3   nystatin ointment (MYCOSTATIN) nystatin 100,000 unit/gram topical ointment  APPLY TO AFFECTED AREA TWICE A DAY TO RASH     ondansetron (ZOFRAN) 4 MG tablet TAKE 1 TABLET BY MOUTH EVERY 8 HOURS AS NEEDED FOR NAUSEA AND VOMITING 20 tablet 0   oxyCODONE-acetaminophen (PERCOCET) 10-325 MG per tablet Take 0.5-1 tablets by mouth every 4 (four) hours as needed for pain.     pregabalin (LYRICA) 150 MG capsule Take 150 mg by mouth 3 (three) times daily.     promethazine (PHENERGAN) 25 MG tablet promethazine 25 mg tablet  TAKE 1/2 TO 1 TABLET BY MOUTH EVERY 6 HOURS AS NEEDED FOR NAUSEA     rosuvastatin (CRESTOR) 10 MG tablet Take 1 tablet (10 mg total) by mouth daily. 90 tablet 3   Semaglutide (OZEMPIC, 1 MG/DOSE, [place]) Inject 1 Dose into the skin once a week.      solifenacin (VESICARE) 5 MG tablet solifenacin 5 mg tablet  TAKE 1 (ONE) TABLET DAILY FOR BLADDER CONTROL     torsemide (DEMADEX) 20 MG tablet Take 2 tablets (40 mg total) by mouth daily. Patient needs to make an appointment for future refills 180 tablet 0   No current facility-administered medications for this visit.    Allergies as of 12/25/2020 - Review Complete 12/25/2020  Allergen Reaction Noted   Imitrex [sumatriptan] Other (See Comments) 02/17/2012   Lamictal [lamotrigine] Nausea And Vomiting 08/07/2019   Metformin and related Diarrhea 12/23/2020    Family History  Problem Relation Age of Onset   Hypertension Mother    Heart disease Sister    Hypertension Brother    Heart disease Brother    Diabetes Maternal Aunt    Diabetes Paternal Aunt    Diabetes Paternal Grandmother    Diabetes Paternal Grandfather    Hypertension Paternal Grandfather    Heart attack Neg Hx    Stroke Neg Hx    Colon cancer Neg Hx    Colon polyps Neg Hx    Esophageal cancer Neg Hx    Rectal cancer Neg Hx    Stomach cancer Neg Hx    Breast cancer Neg Hx     Social History   Socioeconomic History   Marital status: Single    Spouse name: Not on file   Number of children: Not on file   Years of education: Not on file   Highest education level: Not on file  Occupational History   Not on file  Tobacco Use   Smoking status: Never   Smokeless tobacco: Never  Vaping Use   Vaping Use: Not on file  Substance and Sexual Activity   Alcohol use: No   Drug use: No   Sexual  activity: Never  Other Topics Concern   Not on file  Social History Narrative   ** Merged History Encounter **       Social Determinants of Health   Financial Resource Strain: Not on file  Food Insecurity: Not on file  Transportation Needs: Not on file  Physical Activity: Not on file  Stress: Not on file  Social Connections: Not on file  Intimate Partner Violence: Not on file     Physical Exam: BP 136/84 (BP  Location: Left Arm, Patient Position: Sitting, Cuff Size: Normal)   Pulse 84   Ht 5' 5.5" (1.664 m)   Wt 189 lb (85.7 kg)   SpO2 99%   BMI 30.97 kg/m  Constitutional: generally well-appearing Psychiatric: alert and oriented x3 Eyes: extraocular movements intact Mouth: oral pharynx moist, no lesions Neck: supple no lymphadenopathy Cardiovascular: heart regular rate and rhythm Lungs: clear to auscultation bilaterally Abdomen: soft, nontender, nondistended, no obvious ascites, no peritoneal signs, normal bowel sounds Extremities: no lower extremity edema bilaterally Skin: no lesions on visible extremities   Assessment and plan: 50 y.o. female with routine risk for colon cancer, very mild intermittent lower abdominal discomforts  First, she was inadvertently scheduled with me, she is an established patient with Dr. Wilfrid Lund here in our office.  Second she was sent here to discuss colon cancer screening however she is really not due for repeat colon cancer screening.  She had a colonoscopy October 2013, no polyps were found.  She does not have a family history of colon cancer and has had no lower GI symptoms that warrant screening sooner than 10 years.  We will put her in our reminder system for a repeat colonoscopy October 2023 with Dr. Loletha Carrow.  Third she has very brief fairly rare lower abdominal pains that last for just a few seconds and are sharp.  I suspect these are probably gas related pains.  Given how brief they are I do not think testing is necessary.  She has no alarm symptoms such as overt GI bleeding or unintentional weight loss.  She does not have those pains significantly worsened in intensity or frequency that she should call for further advice, probably would warrant imaging at that point with a CT scan abdomen and pelvis.   Please see the "Patient Instructions" section for addition details about the plan.   Owens Loffler, MD [facility] 12/25/2020, 9:42  AM  Cc: [name], MD  Total time on date of encounter was 40 minutes (this included time spent preparing to see the patient reviewing records; obtaining and/or reviewing separately obtained history; performing a medically appropriate exam and/or evaluation; counseling and educating the patient and family if present; ordering medications, tests or procedures if applicable; and documenting clinical information in the health record).

## 2020-12-26 ENCOUNTER — Other Ambulatory Visit (HOSPITAL_COMMUNITY): Payer: Self-pay | Admitting: Cardiology

## 2020-12-27 ENCOUNTER — Ambulatory Visit: Payer: Medicare Other

## 2021-01-03 ENCOUNTER — Inpatient Hospital Stay: Payer: Medicare Other

## 2021-01-03 ENCOUNTER — Other Ambulatory Visit: Payer: Self-pay

## 2021-01-03 VITALS — BP 138/95 | HR 78 | Temp 98.4°F | Resp 18

## 2021-01-03 DIAGNOSIS — D500 Iron deficiency anemia secondary to blood loss (chronic): Secondary | ICD-10-CM

## 2021-01-03 DIAGNOSIS — E538 Deficiency of other specified B group vitamins: Secondary | ICD-10-CM | POA: Diagnosis not present

## 2021-01-03 DIAGNOSIS — Z9884 Bariatric surgery status: Secondary | ICD-10-CM

## 2021-01-03 DIAGNOSIS — N920 Excessive and frequent menstruation with regular cycle: Secondary | ICD-10-CM | POA: Diagnosis present

## 2021-01-03 LAB — CBC WITH DIFFERENTIAL/PLATELET
Abs Immature Granulocytes: 0.01 10*3/uL (ref 0.00–0.07)
Basophils Absolute: 0 10*3/uL (ref 0.0–0.1)
Basophils Relative: 1 %
Eosinophils Absolute: 0.3 10*3/uL (ref 0.0–0.5)
Eosinophils Relative: 5 %
HCT: 39.4 % (ref 36.0–46.0)
Hemoglobin: 12.9 g/dL (ref 12.0–15.0)
Immature Granulocytes: 0 %
Lymphocytes Relative: 25 %
Lymphs Abs: 1.4 10*3/uL (ref 0.7–4.0)
MCH: 29.8 pg (ref 26.0–34.0)
MCHC: 32.7 g/dL (ref 30.0–36.0)
MCV: 91 fL (ref 80.0–100.0)
Monocytes Absolute: 0.4 10*3/uL (ref 0.1–1.0)
Monocytes Relative: 7 %
Neutro Abs: 3.5 10*3/uL (ref 1.7–7.7)
Neutrophils Relative %: 62 %
Platelets: 245 10*3/uL (ref 150–400)
RBC: 4.33 MIL/uL (ref 3.87–5.11)
RDW: 12.9 % (ref 11.5–15.5)
WBC: 5.6 10*3/uL (ref 4.0–10.5)
nRBC: 0 % (ref 0.0–0.2)

## 2021-01-03 LAB — IRON AND TIBC
Iron: 70 ug/dL (ref 28–170)
Saturation Ratios: 20 % (ref 10.4–31.8)
TIBC: 351 ug/dL (ref 250–450)
UIBC: 281 ug/dL

## 2021-01-03 LAB — FERRITIN: Ferritin: 34 ng/mL (ref 11–307)

## 2021-01-03 LAB — VITAMIN B12: Vitamin B-12: 644 pg/mL (ref 180–914)

## 2021-01-03 MED ORDER — CYANOCOBALAMIN 1000 MCG/ML IJ SOLN
1000.0000 ug | Freq: Once | INTRAMUSCULAR | Status: AC
  Administered 2021-01-03: 1000 ug via INTRAMUSCULAR

## 2021-01-03 MED ORDER — LORATADINE 10 MG PO TABS
ORAL_TABLET | ORAL | Status: AC
  Filled 2021-01-03: qty 1

## 2021-01-03 MED ORDER — SODIUM CHLORIDE 0.9 % IV SOLN
Freq: Once | INTRAVENOUS | Status: AC
  Filled 2021-01-03: qty 250

## 2021-01-03 MED ORDER — CYANOCOBALAMIN 1000 MCG/ML IJ SOLN
INTRAMUSCULAR | Status: AC
  Filled 2021-01-03: qty 1

## 2021-01-03 MED ORDER — LORATADINE 10 MG PO TABS
10.0000 mg | ORAL_TABLET | Freq: Once | ORAL | Status: AC
  Administered 2021-01-03: 10 mg via ORAL

## 2021-01-03 MED ORDER — SODIUM CHLORIDE 0.9 % IV SOLN
510.0000 mg | Freq: Once | INTRAVENOUS | Status: AC
  Administered 2021-01-03: 510 mg via INTRAVENOUS
  Filled 2021-01-03: qty 510

## 2021-01-03 NOTE — Patient Instructions (Signed)
Ferumoxytol injection What is this medication? FERUMOXYTOL is an iron complex. Iron is used to make healthy red blood cells, which carry oxygen and nutrients throughout the body. This medicine is used totreat iron deficiency anemia. This medicine may be used for other purposes; ask your health care provider orpharmacist if you have questions. COMMON BRAND NAME(S): Feraheme What should I tell my care team before I take this medication? They need to know if you have any of these conditions: anemia not caused by low iron levels high levels of iron in the blood magnetic resonance imaging (MRI) test scheduled an unusual or allergic reaction to iron, other medicines, foods, dyes, or preservatives pregnant or trying to get pregnant breast-feeding How should I use this medication? This medicine is for injection into a vein. It is given by a health careprofessional in a hospital or clinic setting. Talk to your pediatrician regarding the use of this medicine in children.Special care may be needed. Overdosage: If you think you have taken too much of this medicine contact apoison control center or emergency room at once. NOTE: This medicine is only for you. Do not share this medicine with others. What if I miss a dose? It is important not to miss your dose. Call your doctor or health careprofessional if you are unable to keep an appointment. What may interact with this medication? This medicine may interact with the following medications: other iron products This list may not describe all possible interactions. Give your health care provider a list of all the medicines, herbs, non-prescription drugs, or dietary supplements you use. Also tell them if you smoke, drink alcohol, or use illegaldrugs. Some items may interact with your medicine. What should I watch for while using this medication? Visit your doctor or healthcare professional regularly. Tell your doctor or healthcare professional if your  symptoms do not start to get better or if theyget worse. You may need blood work done while you are taking this medicine. You may need to follow a special diet. Talk to your doctor. Foods that contain iron include: whole grains/cereals, dried fruits, beans, or peas, leafy greenvegetables, and organ meats (liver, kidney). What side effects may I notice from receiving this medication? Side effects that you should report to your doctor or health care professionalas soon as possible: allergic reactions like skin rash, itching or hives, swelling of the face, lips, or tongue breathing problems changes in blood pressure feeling faint or lightheaded, falls fever or chills flushing, sweating, or hot feelings swelling of the ankles or feet Side effects that usually do not require medical attention (report to yourdoctor or health care professional if they continue or are bothersome): diarrhea headache nausea, vomiting stomach pain This list may not describe all possible side effects. Call your doctor for medical advice about side effects. You may report side effects to FDA at1-800-FDA-1088. Where should I keep my medication? This drug is given in a hospital or clinic and will not be stored at home. NOTE: This sheet is a summary. It may not cover all possible information. If you have questions about this medicine, talk to your doctor, pharmacist, orhealth care provider.  2022 Elsevier/Gold Standard (2016-07-20 20:21:10)  

## 2021-01-06 LAB — METHYLMALONIC ACID, SERUM: Methylmalonic Acid, Quantitative: 215 nmol/L (ref 0–378)

## 2021-01-09 ENCOUNTER — Telehealth: Payer: Self-pay | Admitting: Hematology

## 2021-01-09 NOTE — Telephone Encounter (Signed)
Scheduled follow-up appointment per 7/28 staff message. Patient is aware.

## 2021-01-13 ENCOUNTER — Inpatient Hospital Stay: Payer: Medicare Other | Attending: Hematology

## 2021-01-13 ENCOUNTER — Other Ambulatory Visit: Payer: Self-pay

## 2021-01-13 VITALS — BP 138/79 | HR 72 | Temp 97.7°F | Resp 18

## 2021-01-13 DIAGNOSIS — N920 Excessive and frequent menstruation with regular cycle: Secondary | ICD-10-CM | POA: Insufficient documentation

## 2021-01-13 DIAGNOSIS — E538 Deficiency of other specified B group vitamins: Secondary | ICD-10-CM | POA: Insufficient documentation

## 2021-01-13 DIAGNOSIS — D500 Iron deficiency anemia secondary to blood loss (chronic): Secondary | ICD-10-CM | POA: Insufficient documentation

## 2021-01-13 MED ORDER — SODIUM CHLORIDE 0.9 % IV SOLN
INTRAVENOUS | Status: DC
  Filled 2021-01-13: qty 250

## 2021-01-13 MED ORDER — LORATADINE 10 MG PO TABS
10.0000 mg | ORAL_TABLET | Freq: Every day | ORAL | Status: DC
  Administered 2021-01-13: 10 mg via ORAL

## 2021-01-13 MED ORDER — SODIUM CHLORIDE 0.9 % IV SOLN
510.0000 mg | Freq: Once | INTRAVENOUS | Status: AC
  Administered 2021-01-13: 510 mg via INTRAVENOUS
  Filled 2021-01-13: qty 510

## 2021-01-13 MED ORDER — LORATADINE 10 MG PO TABS
ORAL_TABLET | ORAL | Status: AC
  Filled 2021-01-13: qty 1

## 2021-01-13 NOTE — Patient Instructions (Signed)
Ferumoxytol injection What is this medication? FERUMOXYTOL is an iron complex. Iron is used to make healthy red blood cells, which carry oxygen and nutrients throughout the body. This medicine is used totreat iron deficiency anemia. This medicine may be used for other purposes; ask your health care provider orpharmacist if you have questions. COMMON BRAND NAME(S): Feraheme What should I tell my care team before I take this medication? They need to know if you have any of these conditions: anemia not caused by low iron levels high levels of iron in the blood magnetic resonance imaging (MRI) test scheduled an unusual or allergic reaction to iron, other medicines, foods, dyes, or preservatives pregnant or trying to get pregnant breast-feeding How should I use this medication? This medicine is for injection into a vein. It is given by a health careprofessional in a hospital or clinic setting. Talk to your pediatrician regarding the use of this medicine in children.Special care may be needed. Overdosage: If you think you have taken too much of this medicine contact apoison control center or emergency room at once. NOTE: This medicine is only for you. Do not share this medicine with others. What if I miss a dose? It is important not to miss your dose. Call your doctor or health careprofessional if you are unable to keep an appointment. What may interact with this medication? This medicine may interact with the following medications: other iron products This list may not describe all possible interactions. Give your health care provider a list of all the medicines, herbs, non-prescription drugs, or dietary supplements you use. Also tell them if you smoke, drink alcohol, or use illegaldrugs. Some items may interact with your medicine. What should I watch for while using this medication? Visit your doctor or healthcare professional regularly. Tell your doctor or healthcare professional if your  symptoms do not start to get better or if theyget worse. You may need blood work done while you are taking this medicine. You may need to follow a special diet. Talk to your doctor. Foods that contain iron include: whole grains/cereals, dried fruits, beans, or peas, leafy greenvegetables, and organ meats (liver, kidney). What side effects may I notice from receiving this medication? Side effects that you should report to your doctor or health care professionalas soon as possible: allergic reactions like skin rash, itching or hives, swelling of the face, lips, or tongue breathing problems changes in blood pressure feeling faint or lightheaded, falls fever or chills flushing, sweating, or hot feelings swelling of the ankles or feet Side effects that usually do not require medical attention (report to yourdoctor or health care professional if they continue or are bothersome): diarrhea headache nausea, vomiting stomach pain This list may not describe all possible side effects. Call your doctor for medical advice about side effects. You may report side effects to FDA at1-800-FDA-1088. Where should I keep my medication? This drug is given in a hospital or clinic and will not be stored at home. NOTE: This sheet is a summary. It may not cover all possible information. If you have questions about this medicine, talk to your doctor, pharmacist, orhealth care provider.  2022 Elsevier/Gold Standard (2016-07-20 20:21:10)  

## 2021-01-14 ENCOUNTER — Encounter: Payer: Self-pay | Admitting: Hematology

## 2021-01-14 NOTE — Progress Notes (Signed)
Patient made aware that her iron levels were below goal.  Dr. Mosetta Putt recommends IV iron.  Advised scheduling would reach out to her to schedule her infusion.  Patient did acknowledge and is in agreement with treatment.  No further questions or concerns at this time.

## 2021-01-18 DIAGNOSIS — G4733 Obstructive sleep apnea (adult) (pediatric): Secondary | ICD-10-CM | POA: Diagnosis not present

## 2021-01-31 ENCOUNTER — Other Ambulatory Visit: Payer: Self-pay

## 2021-01-31 ENCOUNTER — Inpatient Hospital Stay: Payer: Medicare Other

## 2021-01-31 DIAGNOSIS — D500 Iron deficiency anemia secondary to blood loss (chronic): Secondary | ICD-10-CM

## 2021-01-31 DIAGNOSIS — E538 Deficiency of other specified B group vitamins: Secondary | ICD-10-CM | POA: Diagnosis not present

## 2021-01-31 MED ORDER — CYANOCOBALAMIN 1000 MCG/ML IJ SOLN
1000.0000 ug | Freq: Once | INTRAMUSCULAR | Status: AC
  Administered 2021-01-31: 1000 ug via INTRAMUSCULAR
  Filled 2021-01-31: qty 1

## 2021-02-05 DIAGNOSIS — M5416 Radiculopathy, lumbar region: Secondary | ICD-10-CM | POA: Diagnosis not present

## 2021-02-18 DIAGNOSIS — G4733 Obstructive sleep apnea (adult) (pediatric): Secondary | ICD-10-CM | POA: Diagnosis not present

## 2021-02-26 DIAGNOSIS — M5136 Other intervertebral disc degeneration, lumbar region: Secondary | ICD-10-CM | POA: Diagnosis not present

## 2021-02-26 DIAGNOSIS — Z9689 Presence of other specified functional implants: Secondary | ICD-10-CM | POA: Diagnosis not present

## 2021-02-26 DIAGNOSIS — G629 Polyneuropathy, unspecified: Secondary | ICD-10-CM | POA: Diagnosis not present

## 2021-03-03 ENCOUNTER — Inpatient Hospital Stay: Payer: Medicare Other

## 2021-03-03 ENCOUNTER — Inpatient Hospital Stay: Payer: Medicare Other | Admitting: Hematology

## 2021-03-03 DIAGNOSIS — D500 Iron deficiency anemia secondary to blood loss (chronic): Secondary | ICD-10-CM

## 2021-03-11 NOTE — Progress Notes (Signed)
[facility]   [phone] [phone]   Clinic Follow up Note   Patient Care Team: Hamrick, Durward Fortes, MD as PCP - General (Family Medicine) Laurey Morale, MD as PCP - Advanced Heart Failure (Cardiology) Fuller Plan, RD as Dietitian Hamrick, Durward Fortes, MD as Referring Physician (Family Medicine) Hamrick, Durward Fortes, MD (Family Medicine)  Date of Service:  03/12/2021  CHIEF COMPLAINT: F/u of Iron deficiency anemia   CURRENT THERAPY:  -IV Feraheme as needed  -B12 injection monthly starting 08/30/20  -Oral B12 daily.    ASSESSMENT & PLAN:  [name] is a 50 y.o. female with    1. H.o. severe IDA secondary to gastric bypass and menorrhagia   -Previously, her lab was consistent with iron deficiency, secondary to gastric bypass and menorrhagia. -She has had endometrial ablation in August 2015, no menstrual period since then until mid 2019. She will now only have spotting during her period times.  -She did not respond well to oral iron, and does not want try it anymore. -she was treated with IV feraheme 10/12/2013, 03/20/2014, 08/29/2014. She responded well.  -We will continue monitoring and give her IV iron as needed  2. B12 deficiency  -she used to be on B12 injection, stopped a while ago -Restarted B12 injection in March 2022, overall fatigue improved.    3. Fatigue and drowsiness  -Her prior TSH and sleep study wall normal. Her iron level is has been doing well.  -She attributes this to her comorbidities and worsened since her 05/2020 COVID infection.  -Improved with B12 injections    4. DM2, CHF and nonischemic CHF, Fibromyalgia  -Continue management per PCP, her endocrinologist and cardiologist     5. COVID (+) in 05/2020: Did not receive treatment. Fatigue has been exacerbated since -She has her COVID vaccines and booster  -I discussed if she is to get COVID again she can contact the clinic to get eligible treatment.       PLAN:  -B12 injection today, and continue monthly -repeat lab in 3 months  -Will set up iv feraheme if ferritin <50, no need this time  -Lab and F/u in 6 months    INTERVAL HISTORY:  [name] is here for a follow up of anemia.  She was last seen by me 6 months ago.  She started B12 injection after last follow-up, and her energy level has improved.  She still has mild fatigue, but able to function well.  She received IV iron in later July.   REVIEW OF SYSTEMS:   Constitutional: Denies fevers, chills or abnormal weight loss (+) Fatigue  Eyes: Denies blurriness of vision Ears, nose, mouth, throat, and face: Denies mucositis or sore throat Respiratory: Denies cough, dyspnea or wheezes Cardiovascular: Denies palpitation, chest discomfort or lower extremity swelling Gastrointestinal:  Denies nausea, heartburn or change in bowel habits Skin: Denies abnormal skin rashes Lymphatics: Denies new lymphadenopathy or easy bruising Neurological:Denies numbness, tingling or new weaknesses Behavioral/Psych: Mood is stable, no new changes  All other systems were reviewed with the patient and are negative.  MEDICAL HISTORY:  Past Medical History:  Diagnosis Date   Anemia    hx of iron transfusions - last one 10/2013    Anxiety    Arthritis    neck and shoulders    Asthma    Back pain, chronic    Bipolar disorder (HCC)    Carpal tunnel syndrome on both sides  Cataract    Depression    Diabetes mellitus    Fibromyalgia    GERD (gastroesophageal reflux disease)    History of gastric bypass Jan 2013   Hyperlipidemia    Hypertension    Insomnia    Migraines    Nervous breakdown sept 2013   Neuropathy    Nonischemic cardiomyopathy (HCC)    OSA (obstructive sleep apnea) 02/21/2015   Peripheral vascular disease (HCC)    Shortness of breath    with exertion    Sleep apnea    borderline- no cpap yet   Systolic CHF, acute on chronic (HCC)    CARDIOLOGIST-- DR Aris Lot NELSON   ([place])   Wound of left breast     SURGICAL HISTORY: Past Surgical History:  Procedure Laterality Date   BACK SURGERY  04/1991   BREAST CYST ASPIRATION Left    BREAST SURGERY Left    I&D for abcsess    CARDIAC CATHETERIZATION  11-14-2013  DR DALTON MCLEAN   NORMAL LV FILLING PRESSURE W/ MILD ELEVATED RV FILLING, POSSIBLY SUFFESTING RV FAILURE OUT OF PROPORTION TO LV FAILURE. EF ABOUT 35% WITH DIFFUSE HYPOKINESIS. NO ANGIOGRAPHIC CAD, NONISCHEMIC CARDIOMYOPATHY   CARPAL TUNNEL RELEASE Bilateral    CATARACT EXTRACTION Right    CESAREAN SECTION  05/1997   CHOLECYSTECTOMY  1998   DILITATION & CURRETTAGE/HYSTROSCOPY WITH NOVASURE ABLATION N/A 02/26/2014   Procedure: DILATATION & CURETTAGE/HYSTEROSCOPY WITH NOVASURE ABLATION;  Surgeon: Meriel Pica, MD;  Location: [facility];  Service: Gynecology;  Laterality: N/A;   ESOPHAGOGASTRODUODENOSCOPY  07/27/2011   Procedure: ESOPHAGOGASTRODUODENOSCOPY (EGD);  Surgeon: Freddy Jaksch, MD;  Location: [facility];  Service: Endoscopy;  Laterality: N/A;   GASTRIC ROUX-EN-Y  06/30/2011   Procedure: LAPAROSCOPIC ROUX-EN-Y GASTRIC;  Surgeon: Mariella Saa, MD;  Location: [facility];  Service: General;  Laterality: N/A;      INCISION AND DRAINAGE OF WOUND Left 12/14/2013   Procedure: IRRIGATION AND DEBRIDEMENT OF LEFT BREAST WOUND WITH PLACEMENT OF A-CELL, WITH CLOSURE ;  Surgeon: Wayland Denis, DO;  Location: [facility];  Service: Plastics;  Laterality: Left;   IRRIGATION AND DEBRIDEMENT ABSCESS Left 11/26/2013   Procedure: IRRIGATION AND DEBRIDEMENT ABSCESS;  Surgeon: Velora Heckler, MD;  Location: [facility];  Service: General;  Laterality: Left;   LEFT AND RIGHT HEART CATHETERIZATION WITH CORONARY ANGIOGRAM N/A 11/14/2013   Procedure: LEFT AND RIGHT HEART CATHETERIZATION WITH CORONARY ANGIOGRAM;  Surgeon: Laurey Morale, MD;  Location: [facility];  Service: Cardiovascular;  Laterality: N/A;   SPINAL CORD STIMULATOR INSERTION N/A 03/09/2014   Procedure:  LUMBAR SPINAL CORD STIMULATOR INSERTION;  Surgeon: Gwynne Edinger, MD;  Location: [facility];  Service: Neurosurgery;  Laterality: N/A;   TUBAL LIGATION  06/1997    I have reviewed the social history and family history with the patient and they are unchanged from previous note.  ALLERGIES:  is allergic to imitrex [sumatriptan], lamictal [lamotrigine], and metformin and related.  MEDICATIONS:  Current Outpatient Medications  Medication Sig Dispense Refill   ACCU-CHEK GUIDE test strip 1 each by Other route 3 (three) times daily.     albuterol (VENTOLIN HFA) 108 (90 Base) MCG/ACT inhaler Inhale 2 puffs into the lungs every 4 (four) hours as needed for wheezing or shortness of breath.     Biotin 5000 MCG TABS Take 5,000 mcg by mouth daily.     Cholecalciferol (VITAMIN D3) 5000 units TABS Take 5,000 Units by mouth daily.      cloNIDine (CATAPRES) 0.1  MG tablet Take 0.1 mg by mouth 2 (two) times daily.     cyclobenzaprine (FLEXERIL) 10 MG tablet Take 10 mg by mouth 3 (three) times daily as needed for muscle spasms.      diclofenac sodium (VOLTAREN) 1 % GEL Apply 2 g topically 2 (two) times daily.     DULoxetine (CYMBALTA) 30 MG capsule Take 30 mg by mouth daily.     DULoxetine (CYMBALTA) 60 MG capsule Take 60 mg by mouth daily.     ENTRESTO 24-26 MG TAKE 1 TABLET BY MOUTH TWICE A DAY 180 tablet 3   famotidine (PEPCID) 20 MG tablet Take 20 mg by mouth daily.     fluconazole (DIFLUCAN) 150 MG tablet Take 1 tablet by mouth daily.     fluticasone (FLONASE) 50 MCG/ACT nasal spray Place 1 spray into both nostrils daily.      ibuprofen (ADVIL,MOTRIN) 800 MG tablet Take 800 mg by mouth every 8 (eight) hours as needed (pain).     insulin glargine (LANTUS) 100 UNIT/ML injection Inject 50 Units into the skin daily.     JARDIANCE 10 MG TABS tablet Take 10 mg by mouth daily.     lubiprostone (AMITIZA) 24 MCG capsule Take 24 mcg by mouth 2 (two) times daily.     metoprolol succinate (TOPROL-XL) 50 MG 24 hr  tablet TAKE 1 TABLET BY MOUTH EVERY DAY 90 tablet 3   nystatin ointment (MYCOSTATIN) nystatin 100,000 unit/gram topical ointment  APPLY TO AFFECTED AREA TWICE A DAY TO RASH     ondansetron (ZOFRAN) 4 MG tablet TAKE 1 TABLET BY MOUTH EVERY 8 HOURS AS NEEDED FOR NAUSEA AND VOMITING 20 tablet 0   oxyCODONE-acetaminophen (PERCOCET) 10-325 MG per tablet Take 0.5-1 tablets by mouth every 4 (four) hours as needed for pain.     pregabalin (LYRICA) 150 MG capsule Take 150 mg by mouth 3 (three) times daily.     promethazine (PHENERGAN) 25 MG tablet promethazine 25 mg tablet  TAKE 1/2 TO 1 TABLET BY MOUTH EVERY 6 HOURS AS NEEDED FOR NAUSEA     rosuvastatin (CRESTOR) 10 MG tablet Take 1 tablet (10 mg total) by mouth daily. 90 tablet 3   Semaglutide (OZEMPIC, 1 MG/DOSE, [place]) Inject 1 Dose into the skin once a week.     solifenacin (VESICARE) 5 MG tablet solifenacin 5 mg tablet  TAKE 1 (ONE) TABLET DAILY FOR BLADDER CONTROL     torsemide (DEMADEX) 20 MG tablet Take 2 tablets (40 mg total) by mouth daily. 180 tablet 3   No current facility-administered medications for this visit.    PHYSICAL EXAMINATION: ECOG PERFORMANCE STATUS: 1 - Symptomatic but completely ambulatory  Vitals:   03/12/21 0828  BP: (!) 158/94  Pulse: 76  Resp: 18  Temp: 97.7 F (36.5 C)  SpO2: 100%    Filed Weights   03/12/21 0828  Weight: 185 lb 8 oz (84.1 kg)     Due to COVID19 we will limit examination to appearance. Patient had no complaints.  GENERAL:alert, no distress and comfortable SKIN: skin color normal, no rashes or significant lesions EYES: normal, Conjunctiva are pink and non-injected, sclera clear  NEURO: alert & oriented x 3 with fluent speech   LABORATORY DATA:  I have reviewed the data as listed CBC Latest Ref Rng & Units 03/12/2021 01/03/2021 11/29/2020  WBC 4.0 - 10.5 K/uL 7.6 5.6 8.9  Hemoglobin 12.0 - 15.0 g/dL 44.3 15.4 00.8  Hematocrit 36.0 - 46.0 % 40.0 39.4 42.4  Platelets  150 - 400 K/uL 211  245 234     CMP Latest Ref Rng & Units 11/27/2020 10/18/2020 08/07/2019  Glucose 70 - 99 mg/dL - 57(S) 177(L)  BUN 6 - 20 mg/dL - 13 18  Creatinine 3.90 - 1.00 mg/dL 3.00 9.23 3.00  Sodium 135 - 145 mmol/L - 141 137  Potassium 3.5 - 5.1 mmol/L - 4.2 3.7  Chloride 98 - 111 mmol/L - 111 108  CO2 22 - 32 mmol/L - 25 19(L)  Calcium 8.9 - 10.3 mg/dL - 9.7 7.6(A)  Total Protein 6.5 - 8.1 g/dL - - -  Total Bilirubin 0.3 - 1.2 mg/dL - - -  Alkaline Phos 38 - 126 U/L - - -  AST 15 - 41 U/L - - -  ALT 0 - 44 U/L - - -      RADIOGRAPHIC STUDIES: I have personally reviewed the radiological images as listed and agreed with the findings in the report. No results found.     No problem-specific Assessment & Plan notes found for this encounter.   Orders Placed This Encounter  Procedures   Vitamin B12    Standing Status:   Standing    Number of Occurrences:   5    Standing Expiration Date:   03/12/2022    All questions were answered. The patient knows to call the clinic with any problems, questions or concerns. No barriers to learning was detected. The total time spent in the appointment was 25 minutes.     Malachy Mood, MD 03/12/2021

## 2021-03-12 ENCOUNTER — Inpatient Hospital Stay (HOSPITAL_BASED_OUTPATIENT_CLINIC_OR_DEPARTMENT_OTHER): Payer: Medicare Other | Admitting: Hematology

## 2021-03-12 ENCOUNTER — Other Ambulatory Visit: Payer: Self-pay

## 2021-03-12 ENCOUNTER — Inpatient Hospital Stay: Payer: Medicare Other | Attending: Hematology

## 2021-03-12 ENCOUNTER — Inpatient Hospital Stay: Payer: Medicare Other

## 2021-03-12 ENCOUNTER — Encounter: Payer: Self-pay | Admitting: Hematology

## 2021-03-12 VITALS — BP 158/94 | HR 76 | Temp 97.7°F | Resp 18 | Wt 185.5 lb

## 2021-03-12 DIAGNOSIS — D500 Iron deficiency anemia secondary to blood loss (chronic): Secondary | ICD-10-CM | POA: Diagnosis not present

## 2021-03-12 DIAGNOSIS — N920 Excessive and frequent menstruation with regular cycle: Secondary | ICD-10-CM | POA: Insufficient documentation

## 2021-03-12 DIAGNOSIS — E538 Deficiency of other specified B group vitamins: Secondary | ICD-10-CM

## 2021-03-12 DIAGNOSIS — Z9884 Bariatric surgery status: Secondary | ICD-10-CM

## 2021-03-12 LAB — CBC WITH DIFFERENTIAL/PLATELET
Abs Immature Granulocytes: 0.01 10*3/uL (ref 0.00–0.07)
Basophils Absolute: 0.1 10*3/uL (ref 0.0–0.1)
Basophils Relative: 1 %
Eosinophils Absolute: 0.4 10*3/uL (ref 0.0–0.5)
Eosinophils Relative: 5 %
HCT: 40 % (ref 36.0–46.0)
Hemoglobin: 13.1 g/dL (ref 12.0–15.0)
Immature Granulocytes: 0 %
Lymphocytes Relative: 25 %
Lymphs Abs: 1.9 10*3/uL (ref 0.7–4.0)
MCH: 30 pg (ref 26.0–34.0)
MCHC: 32.8 g/dL (ref 30.0–36.0)
MCV: 91.5 fL (ref 80.0–100.0)
Monocytes Absolute: 0.6 10*3/uL (ref 0.1–1.0)
Monocytes Relative: 7 %
Neutro Abs: 4.8 10*3/uL (ref 1.7–7.7)
Neutrophils Relative %: 62 %
Platelets: 211 10*3/uL (ref 150–400)
RBC: 4.37 MIL/uL (ref 3.87–5.11)
RDW: 13.1 % (ref 11.5–15.5)
WBC: 7.6 10*3/uL (ref 4.0–10.5)
nRBC: 0 % (ref 0.0–0.2)

## 2021-03-12 LAB — FERRITIN: Ferritin: 500 ng/mL — ABNORMAL HIGH (ref 11–307)

## 2021-03-12 LAB — IRON AND TIBC
Iron: 84 ug/dL (ref 41–142)
Saturation Ratios: 35 % (ref 21–57)
TIBC: 241 ug/dL (ref 236–444)
UIBC: 157 ug/dL (ref 120–384)

## 2021-03-12 MED ORDER — CYANOCOBALAMIN 1000 MCG/ML IJ SOLN
1000.0000 ug | Freq: Once | INTRAMUSCULAR | Status: AC
  Administered 2021-03-12: 1000 ug via INTRAMUSCULAR
  Filled 2021-03-12: qty 1

## 2021-03-15 LAB — METHYLMALONIC ACID, SERUM: Methylmalonic Acid, Quantitative: 209 nmol/L (ref 0–378)

## 2021-03-16 ENCOUNTER — Encounter: Payer: Self-pay | Admitting: Hematology

## 2021-03-17 DIAGNOSIS — E11319 Type 2 diabetes mellitus with unspecified diabetic retinopathy without macular edema: Secondary | ICD-10-CM | POA: Diagnosis not present

## 2021-03-17 DIAGNOSIS — E785 Hyperlipidemia, unspecified: Secondary | ICD-10-CM | POA: Diagnosis not present

## 2021-03-17 DIAGNOSIS — I509 Heart failure, unspecified: Secondary | ICD-10-CM | POA: Diagnosis not present

## 2021-03-17 DIAGNOSIS — D509 Iron deficiency anemia, unspecified: Secondary | ICD-10-CM | POA: Diagnosis not present

## 2021-03-17 DIAGNOSIS — I10 Essential (primary) hypertension: Secondary | ICD-10-CM | POA: Diagnosis not present

## 2021-03-20 DIAGNOSIS — G4733 Obstructive sleep apnea (adult) (pediatric): Secondary | ICD-10-CM | POA: Diagnosis not present

## 2021-04-01 ENCOUNTER — Other Ambulatory Visit (HOSPITAL_COMMUNITY): Payer: Self-pay | Admitting: Cardiology

## 2021-04-01 DIAGNOSIS — R079 Chest pain, unspecified: Secondary | ICD-10-CM

## 2021-04-09 ENCOUNTER — Other Ambulatory Visit: Payer: Self-pay

## 2021-04-09 ENCOUNTER — Inpatient Hospital Stay: Payer: Medicare Other | Attending: Hematology

## 2021-04-09 DIAGNOSIS — D500 Iron deficiency anemia secondary to blood loss (chronic): Secondary | ICD-10-CM

## 2021-04-09 DIAGNOSIS — E538 Deficiency of other specified B group vitamins: Secondary | ICD-10-CM | POA: Insufficient documentation

## 2021-04-09 MED ORDER — CYANOCOBALAMIN 1000 MCG/ML IJ SOLN
1000.0000 ug | Freq: Once | INTRAMUSCULAR | Status: AC
  Administered 2021-04-09: 1000 ug via INTRAMUSCULAR
  Filled 2021-04-09: qty 1

## 2021-04-20 DIAGNOSIS — G4733 Obstructive sleep apnea (adult) (pediatric): Secondary | ICD-10-CM | POA: Diagnosis not present

## 2021-04-23 DIAGNOSIS — E876 Hypokalemia: Secondary | ICD-10-CM | POA: Diagnosis not present

## 2021-05-07 ENCOUNTER — Other Ambulatory Visit: Payer: Self-pay

## 2021-05-07 ENCOUNTER — Inpatient Hospital Stay: Payer: Medicare Other | Attending: Hematology

## 2021-05-07 DIAGNOSIS — E538 Deficiency of other specified B group vitamins: Secondary | ICD-10-CM | POA: Diagnosis not present

## 2021-05-07 DIAGNOSIS — D500 Iron deficiency anemia secondary to blood loss (chronic): Secondary | ICD-10-CM

## 2021-05-07 MED ORDER — CYANOCOBALAMIN 1000 MCG/ML IJ SOLN
1000.0000 ug | Freq: Once | INTRAMUSCULAR | Status: AC
  Administered 2021-05-07: 1000 ug via INTRAMUSCULAR
  Filled 2021-05-07: qty 1

## 2021-05-12 DIAGNOSIS — M5416 Radiculopathy, lumbar region: Secondary | ICD-10-CM | POA: Diagnosis not present

## 2021-06-03 ENCOUNTER — Other Ambulatory Visit: Payer: Self-pay

## 2021-06-03 DIAGNOSIS — D500 Iron deficiency anemia secondary to blood loss (chronic): Secondary | ICD-10-CM

## 2021-06-04 ENCOUNTER — Inpatient Hospital Stay: Payer: Medicare Other | Attending: Hematology

## 2021-06-04 ENCOUNTER — Ambulatory Visit: Payer: Medicare Other | Admitting: Podiatry

## 2021-06-04 ENCOUNTER — Inpatient Hospital Stay: Payer: Medicare Other

## 2021-06-20 DIAGNOSIS — Z9689 Presence of other specified functional implants: Secondary | ICD-10-CM | POA: Diagnosis not present

## 2021-06-20 DIAGNOSIS — G629 Polyneuropathy, unspecified: Secondary | ICD-10-CM | POA: Diagnosis not present

## 2021-06-20 DIAGNOSIS — M5136 Other intervertebral disc degeneration, lumbar region: Secondary | ICD-10-CM | POA: Diagnosis not present

## 2021-07-07 DIAGNOSIS — E1129 Type 2 diabetes mellitus with other diabetic kidney complication: Secondary | ICD-10-CM | POA: Diagnosis not present

## 2021-07-07 DIAGNOSIS — D509 Iron deficiency anemia, unspecified: Secondary | ICD-10-CM | POA: Diagnosis not present

## 2021-07-07 DIAGNOSIS — Z794 Long term (current) use of insulin: Secondary | ICD-10-CM | POA: Diagnosis not present

## 2021-07-07 DIAGNOSIS — I509 Heart failure, unspecified: Secondary | ICD-10-CM | POA: Diagnosis not present

## 2021-07-07 DIAGNOSIS — R809 Proteinuria, unspecified: Secondary | ICD-10-CM | POA: Diagnosis not present

## 2021-07-07 DIAGNOSIS — I10 Essential (primary) hypertension: Secondary | ICD-10-CM | POA: Diagnosis not present

## 2021-07-07 DIAGNOSIS — J309 Allergic rhinitis, unspecified: Secondary | ICD-10-CM | POA: Diagnosis not present

## 2021-07-07 DIAGNOSIS — E785 Hyperlipidemia, unspecified: Secondary | ICD-10-CM | POA: Diagnosis not present

## 2021-07-08 ENCOUNTER — Telehealth: Payer: Self-pay | Admitting: *Deleted

## 2021-07-08 NOTE — Telephone Encounter (Signed)
° °  Name: [name]  DOB: [birth year]  MRN: [id number]   Primary Cardiologist: Marca Ancona, MD  Chart reviewed as part of pre-operative protocol coverage. We have been asked for guidance to hold ASA. Pt has nonobstructive CAD by coronary CTA 2022. We prefer to continue ASA throughout the perioperative period. If doing so would significantly increase morbidity or mortality, may hold for 5 days.   I will route this recommendation to the requesting party via Epic fax function and remove from pre-op pool. Please call with questions.  Roe Rutherford Avrohom Mckelvin, PA 07/08/2021, 7:14 PM

## 2021-07-08 NOTE — Telephone Encounter (Signed)
° °  Pre-operative Risk Assessment    Patient Name: [name]  DOB: 10/10/1970 MRN: [id number]      Request for Surgical Clearance    Procedure:   Left carpal tunnel release open with possible hypothenar fat pad flap and repair  Date of Surgery:  Clearance 07/11/21                                 Surgeon:  Dr Roseanne Kaufman Surgeon's Group or Practice Name:  [facility] Phone number:  [phone] Fax number:  [phone] [place]    Type of Clearance Requested:   - Pharmacy:  Hold Aspirin , requested to hold but not listed on current med list    Type of Anesthesia:  Block with IV sedation   Additional requests/questions:    Gillermina Phy   07/08/2021, 1:52 PM

## 2021-07-09 ENCOUNTER — Inpatient Hospital Stay: Payer: Medicare Other | Attending: Hematology

## 2021-07-09 ENCOUNTER — Encounter (HOSPITAL_COMMUNITY): Payer: Medicare Other | Admitting: Cardiology

## 2021-07-11 DIAGNOSIS — G8918 Other acute postprocedural pain: Secondary | ICD-10-CM | POA: Diagnosis not present

## 2021-07-11 DIAGNOSIS — G5602 Carpal tunnel syndrome, left upper limb: Secondary | ICD-10-CM | POA: Diagnosis not present

## 2021-07-24 DIAGNOSIS — G5602 Carpal tunnel syndrome, left upper limb: Secondary | ICD-10-CM | POA: Diagnosis not present

## 2021-07-24 DIAGNOSIS — M25632 Stiffness of left wrist, not elsewhere classified: Secondary | ICD-10-CM | POA: Diagnosis not present

## 2021-07-31 DIAGNOSIS — M25632 Stiffness of left wrist, not elsewhere classified: Secondary | ICD-10-CM | POA: Diagnosis not present

## 2021-08-05 DIAGNOSIS — E876 Hypokalemia: Secondary | ICD-10-CM | POA: Diagnosis not present

## 2021-08-06 ENCOUNTER — Inpatient Hospital Stay: Payer: Medicare Other | Attending: Hematology

## 2021-08-06 ENCOUNTER — Telehealth: Payer: Self-pay | Admitting: Hematology

## 2021-08-06 DIAGNOSIS — E538 Deficiency of other specified B group vitamins: Secondary | ICD-10-CM | POA: Insufficient documentation

## 2021-08-06 NOTE — Telephone Encounter (Signed)
Patient called to rescheduled missed appointment today. Patient is aware of changes.

## 2021-08-07 DIAGNOSIS — M25632 Stiffness of left wrist, not elsewhere classified: Secondary | ICD-10-CM | POA: Diagnosis not present

## 2021-08-08 ENCOUNTER — Other Ambulatory Visit: Payer: Self-pay

## 2021-08-08 ENCOUNTER — Inpatient Hospital Stay: Payer: Medicare Other

## 2021-08-08 VITALS — BP 138/81 | HR 72 | Resp 18

## 2021-08-08 DIAGNOSIS — E538 Deficiency of other specified B group vitamins: Secondary | ICD-10-CM | POA: Diagnosis not present

## 2021-08-08 DIAGNOSIS — D500 Iron deficiency anemia secondary to blood loss (chronic): Secondary | ICD-10-CM

## 2021-08-08 MED ORDER — CYANOCOBALAMIN 1000 MCG/ML IJ SOLN
1000.0000 ug | Freq: Once | INTRAMUSCULAR | Status: AC
  Administered 2021-08-08: 1000 ug via INTRAMUSCULAR
  Filled 2021-08-08: qty 1

## 2021-08-15 ENCOUNTER — Telehealth: Payer: Self-pay | Admitting: Hematology

## 2021-08-15 DIAGNOSIS — M25632 Stiffness of left wrist, not elsewhere classified: Secondary | ICD-10-CM | POA: Diagnosis not present

## 2021-08-15 NOTE — Telephone Encounter (Signed)
Rescheduled upcoming appointment due to provider's breast clinic. Patient is aware of changes. 

## 2021-08-18 DIAGNOSIS — M5416 Radiculopathy, lumbar region: Secondary | ICD-10-CM | POA: Diagnosis not present

## 2021-08-21 DIAGNOSIS — M25632 Stiffness of left wrist, not elsewhere classified: Secondary | ICD-10-CM | POA: Diagnosis not present

## 2021-08-21 DIAGNOSIS — M79642 Pain in left hand: Secondary | ICD-10-CM | POA: Diagnosis not present

## 2021-08-29 ENCOUNTER — Encounter (HOSPITAL_COMMUNITY): Payer: Medicare Other | Admitting: Cardiology

## 2021-09-03 ENCOUNTER — Other Ambulatory Visit: Payer: Medicare Other

## 2021-09-03 ENCOUNTER — Ambulatory Visit: Payer: Medicare Other

## 2021-09-03 ENCOUNTER — Ambulatory Visit: Payer: Medicare Other | Admitting: Hematology

## 2021-09-05 ENCOUNTER — Inpatient Hospital Stay: Payer: Medicare Other

## 2021-09-05 ENCOUNTER — Inpatient Hospital Stay: Payer: Medicare Other | Admitting: Hematology

## 2021-09-05 ENCOUNTER — Inpatient Hospital Stay: Payer: Medicare Other | Attending: Hematology

## 2021-09-05 DIAGNOSIS — M79642 Pain in left hand: Secondary | ICD-10-CM | POA: Diagnosis not present

## 2021-09-05 DIAGNOSIS — M25632 Stiffness of left wrist, not elsewhere classified: Secondary | ICD-10-CM | POA: Diagnosis not present

## 2021-10-06 DIAGNOSIS — Z9181 History of falling: Secondary | ICD-10-CM | POA: Diagnosis not present

## 2021-10-06 DIAGNOSIS — E785 Hyperlipidemia, unspecified: Secondary | ICD-10-CM | POA: Diagnosis not present

## 2021-10-06 DIAGNOSIS — Z0000 Encounter for general adult medical examination without abnormal findings: Secondary | ICD-10-CM | POA: Diagnosis not present

## 2021-10-07 DIAGNOSIS — M5136 Other intervertebral disc degeneration, lumbar region: Secondary | ICD-10-CM | POA: Diagnosis not present

## 2021-10-07 DIAGNOSIS — M5416 Radiculopathy, lumbar region: Secondary | ICD-10-CM | POA: Diagnosis not present

## 2021-10-08 DIAGNOSIS — I509 Heart failure, unspecified: Secondary | ICD-10-CM | POA: Diagnosis not present

## 2021-10-08 DIAGNOSIS — E785 Hyperlipidemia, unspecified: Secondary | ICD-10-CM | POA: Diagnosis not present

## 2021-10-08 DIAGNOSIS — I10 Essential (primary) hypertension: Secondary | ICD-10-CM | POA: Diagnosis not present

## 2021-10-08 DIAGNOSIS — I428 Other cardiomyopathies: Secondary | ICD-10-CM | POA: Diagnosis not present

## 2021-10-08 DIAGNOSIS — M549 Dorsalgia, unspecified: Secondary | ICD-10-CM | POA: Diagnosis not present

## 2021-10-08 DIAGNOSIS — D509 Iron deficiency anemia, unspecified: Secondary | ICD-10-CM | POA: Diagnosis not present

## 2021-10-08 DIAGNOSIS — Z794 Long term (current) use of insulin: Secondary | ICD-10-CM | POA: Diagnosis not present

## 2021-10-08 DIAGNOSIS — J309 Allergic rhinitis, unspecified: Secondary | ICD-10-CM | POA: Diagnosis not present

## 2021-10-08 DIAGNOSIS — R809 Proteinuria, unspecified: Secondary | ICD-10-CM | POA: Diagnosis not present

## 2021-10-08 DIAGNOSIS — E1129 Type 2 diabetes mellitus with other diabetic kidney complication: Secondary | ICD-10-CM | POA: Diagnosis not present

## 2021-10-08 DIAGNOSIS — R748 Abnormal levels of other serum enzymes: Secondary | ICD-10-CM | POA: Diagnosis not present

## 2021-11-14 ENCOUNTER — Ambulatory Visit (HOSPITAL_COMMUNITY)
Admission: RE | Admit: 2021-11-14 | Discharge: 2021-11-14 | Disposition: A | Discharge status: Home / Self Care | Payer: Medicare Other | Source: Ambulatory Visit | Attending: Cardiology | Admitting: Cardiology

## 2021-11-14 ENCOUNTER — Encounter (HOSPITAL_COMMUNITY): Payer: Self-pay | Admitting: Cardiology

## 2021-11-14 VITALS — BP 130/80 | HR 73 | Wt 167.4 lb

## 2021-11-14 DIAGNOSIS — Z833 Family history of diabetes mellitus: Secondary | ICD-10-CM | POA: Diagnosis not present

## 2021-11-14 DIAGNOSIS — G4733 Obstructive sleep apnea (adult) (pediatric): Secondary | ICD-10-CM | POA: Diagnosis not present

## 2021-11-14 DIAGNOSIS — Z7984 Long term (current) use of oral hypoglycemic drugs: Secondary | ICD-10-CM | POA: Insufficient documentation

## 2021-11-14 DIAGNOSIS — Z6827 Body mass index (BMI) 27.0-27.9, adult: Secondary | ICD-10-CM | POA: Diagnosis not present

## 2021-11-14 DIAGNOSIS — Z79899 Other long term (current) drug therapy: Secondary | ICD-10-CM | POA: Diagnosis not present

## 2021-11-14 DIAGNOSIS — Z8249 Family history of ischemic heart disease and other diseases of the circulatory system: Secondary | ICD-10-CM | POA: Diagnosis not present

## 2021-11-14 DIAGNOSIS — I110 Hypertensive heart disease with heart failure: Secondary | ICD-10-CM | POA: Insufficient documentation

## 2021-11-14 DIAGNOSIS — E119 Type 2 diabetes mellitus without complications: Secondary | ICD-10-CM | POA: Insufficient documentation

## 2021-11-14 DIAGNOSIS — R7402 Elevation of levels of lactic acid dehydrogenase (LDH): Secondary | ICD-10-CM | POA: Insufficient documentation

## 2021-11-14 DIAGNOSIS — I428 Other cardiomyopathies: Secondary | ICD-10-CM | POA: Diagnosis not present

## 2021-11-14 DIAGNOSIS — I5022 Chronic systolic (congestive) heart failure: Secondary | ICD-10-CM | POA: Insufficient documentation

## 2021-11-14 DIAGNOSIS — E782 Mixed hyperlipidemia: Secondary | ICD-10-CM | POA: Diagnosis not present

## 2021-11-14 DIAGNOSIS — E669 Obesity, unspecified: Secondary | ICD-10-CM | POA: Insufficient documentation

## 2021-11-14 DIAGNOSIS — E785 Hyperlipidemia, unspecified: Secondary | ICD-10-CM | POA: Diagnosis not present

## 2021-11-14 DIAGNOSIS — I2510 Atherosclerotic heart disease of native coronary artery without angina pectoris: Secondary | ICD-10-CM | POA: Insufficient documentation

## 2021-11-14 LAB — BASIC METABOLIC PANEL
Anion gap: 7 (ref 5–15)
BUN: 19 mg/dL (ref 6–20)
CO2: 23 mmol/L (ref 22–32)
Calcium: 8.9 mg/dL (ref 8.9–10.3)
Chloride: 113 mmol/L — ABNORMAL HIGH (ref 98–111)
Creatinine, Ser: 1.03 mg/dL — ABNORMAL HIGH (ref 0.44–1.00)
GFR, Estimated: >60 mL/min (ref >60)
Glucose, Bld: 129 mg/dL — ABNORMAL HIGH (ref 70–99)
Potassium: 3.3 mmol/L — ABNORMAL LOW (ref 3.5–5.1)
Sodium: 143 mmol/L (ref 135–145)

## 2021-11-14 LAB — LIPID PANEL
Cholesterol: 111 mg/dL (ref 0–200)
HDL: 51 mg/dL (ref >40)
LDL Cholesterol: 46 mg/dL (ref 0–99)
Total CHOL/HDL Ratio: 2.2 RATIO
Triglycerides: 70 mg/dL (ref <150)
VLDL: 14 mg/dL (ref 0–40)

## 2021-11-14 NOTE — Patient Instructions (Signed)
There has been no changes to your medications.  Labs done today, your results will be available in MyChart, we will contact you for abnormal readings.  Your physician has requested that you have an echocardiogram. Echocardiography is a painless test that uses sound waves to create images of your heart. It provides your doctor with information about the size and shape of your heart and how well your heart's chambers and valves are working. This procedure takes approximately one hour. There are no restrictions for this procedure.  Your physician recommends that you schedule a follow-up appointment in: 6 months with echocardiogram. (December 2023)  **PLEASE CALL THE OFFICE IN SEPTEMBER TO ARRANGE YOU  FOLLOW UP APPOINTMENT **  If you have any questions or concerns before your next appointment please send [place] a message through [place] or call our office at [phone].    TO LEAVE A MESSAGE FOR THE NURSE SELECT OPTION 2, PLEASE LEAVE A MESSAGE INCLUDING: YOUR NAME DATE OF BIRTH CALL BACK NUMBER REASON FOR CALL**this is important as we prioritize the call backs  YOU WILL RECEIVE A CALL BACK THE SAME DAY AS LONG AS YOU CALL BEFORE 4:00 PM  At the [facility], you and your health needs are our priority. As part of our continuing mission to provide you with exceptional heart care, we have created designated Provider Care Teams. These Care Teams include your primary Cardiologist (physician) and Advanced Practice Providers (APPs- Physician Assistants and Nurse Practitioners) who all work together to provide you with the care you need, when you need it.   You may see any of the following providers on your designated Care Team at your next follow up: Dr Arvilla Meres Dr Carron Curie, NP Robbie Lis, [place] [facility] [place], [place] Karle Plumber, PharmD   Please be sure to bring in all your medications bottles to every appointment.
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
## 2021-11-17 ENCOUNTER — Other Ambulatory Visit (HOSPITAL_COMMUNITY): Payer: Self-pay

## 2021-11-17 ENCOUNTER — Telehealth (HOSPITAL_COMMUNITY): Payer: Self-pay

## 2021-11-17 DIAGNOSIS — I5022 Chronic systolic (congestive) heart failure: Secondary | ICD-10-CM

## 2021-11-17 MED ORDER — POTASSIUM CHLORIDE CRYS ER 20 MEQ PO TBCR: 80.0000 meq | EXTENDED_RELEASE_TABLET | Freq: Every day | ORAL | 3 refills | Status: DC

## 2021-11-17 NOTE — Telephone Encounter (Signed)
-----   Message from Larey Dresser, MD sent at 11/14/2021  4:07 PM EDT ----- Increase total daily K by 20 mEq. BMET 10 days.

## 2021-11-17 NOTE — Telephone Encounter (Signed)
Pt reports taking potassium from PCP, then increased after PCP appt to in 09/2021. Per Dr. Shirlee Latch increase another of potassium.  Increased to total per day. Pt understands and confirms plan for 2 tab AM, 2 tab PM. BMET 6/16.    Stoney Bang, RN  11/17/2021  4:52 PM EDT Back to Top    Spoke with patient regarding labs results. Increased potassium per day. Pt now taking per day (40 am, 40 pm). Pt aware, agreeable, and verbalizes understanding. BMET scheduled for 6/16 @ 0815 per pt request. Lab ordered.    Theresia Bough, CMA  11/14/2021  4:20 PM EDT     Patient called.  [facility] Phone:  [phone] Judie Petit)   Laurey Morale, MD  11/14/2021  4:07 PM EDT     Increase total daily K by 20 mEq. BMET 10 days.

## 2021-11-19 DIAGNOSIS — M5416 Radiculopathy, lumbar region: Secondary | ICD-10-CM | POA: Diagnosis not present

## 2021-11-25 ENCOUNTER — Other Ambulatory Visit (HOSPITAL_COMMUNITY): Payer: Self-pay | Admitting: Cardiology

## 2021-11-28 ENCOUNTER — Other Ambulatory Visit (HOSPITAL_COMMUNITY): Payer: Medicare Other

## 2021-12-03 ENCOUNTER — Other Ambulatory Visit (HOSPITAL_COMMUNITY): Payer: Medicare Other

## 2022-01-05 ENCOUNTER — Other Ambulatory Visit: Payer: Self-pay | Admitting: Family Medicine

## 2022-01-05 DIAGNOSIS — Z1231 Encounter for screening mammogram for malignant neoplasm of breast: Secondary | ICD-10-CM

## 2022-01-06 ENCOUNTER — Other Ambulatory Visit (HOSPITAL_COMMUNITY): Payer: Medicare Other

## 2022-01-07 ENCOUNTER — Ambulatory Visit (HOSPITAL_COMMUNITY)
Admission: RE | Admit: 2022-01-07 | Discharge: 2022-01-07 | Disposition: A | Discharge status: Home / Self Care | Payer: Medicare Other | Source: Ambulatory Visit | Attending: Internal Medicine | Admitting: Internal Medicine

## 2022-01-07 DIAGNOSIS — I5022 Chronic systolic (congestive) heart failure: Secondary | ICD-10-CM | POA: Insufficient documentation

## 2022-01-07 LAB — BASIC METABOLIC PANEL
Anion gap: 10 (ref 5–15)
BUN: 14 mg/dL (ref 6–20)
CO2: 23 mmol/L (ref 22–32)
Calcium: 8.8 mg/dL — ABNORMAL LOW (ref 8.9–10.3)
Chloride: 111 mmol/L (ref 98–111)
Creatinine, Ser: 0.81 mg/dL (ref 0.44–1.00)
GFR, Estimated: >60 mL/min (ref >60)
Glucose, Bld: 102 mg/dL — ABNORMAL HIGH (ref 70–99)
Potassium: 2.8 mmol/L — ABNORMAL LOW (ref 3.5–5.1)
Sodium: 144 mmol/L (ref 135–145)

## 2022-01-09 ENCOUNTER — Telehealth (HOSPITAL_COMMUNITY): Payer: Self-pay | Admitting: Surgery

## 2022-01-09 NOTE — Telephone Encounter (Signed)
I attempted to reach patient to review results and recommendations per provider.  I was unable to leave a message.  Someone from the [facility] clinic will attempt to reach patient at another time.

## 2022-01-09 NOTE — Telephone Encounter (Signed)
-----   Message from Laurey Morale, MD sent at 01/08/2022  4:55 PM EDT ----- Take KCl 40 mEq x 2 doses today separated by 2 hours.  Then continue KCl at 40 mEq daily with BMET next week.

## 2022-01-12 DIAGNOSIS — M5136 Other intervertebral disc degeneration, lumbar region: Secondary | ICD-10-CM | POA: Diagnosis not present

## 2022-01-12 DIAGNOSIS — M5416 Radiculopathy, lumbar region: Secondary | ICD-10-CM | POA: Diagnosis not present

## 2022-01-12 DIAGNOSIS — Z9689 Presence of other specified functional implants: Secondary | ICD-10-CM | POA: Diagnosis not present

## 2022-01-13 ENCOUNTER — Telehealth (HOSPITAL_COMMUNITY): Payer: Self-pay | Admitting: Surgery

## 2022-01-13 DIAGNOSIS — I5022 Chronic systolic (congestive) heart failure: Secondary | ICD-10-CM

## 2022-01-13 DIAGNOSIS — Z794 Long term (current) use of insulin: Secondary | ICD-10-CM | POA: Diagnosis not present

## 2022-01-13 DIAGNOSIS — I509 Heart failure, unspecified: Secondary | ICD-10-CM | POA: Diagnosis not present

## 2022-01-13 DIAGNOSIS — D509 Iron deficiency anemia, unspecified: Secondary | ICD-10-CM | POA: Diagnosis not present

## 2022-01-13 DIAGNOSIS — E785 Hyperlipidemia, unspecified: Secondary | ICD-10-CM | POA: Diagnosis not present

## 2022-01-13 DIAGNOSIS — R809 Proteinuria, unspecified: Secondary | ICD-10-CM | POA: Diagnosis not present

## 2022-01-13 DIAGNOSIS — M549 Dorsalgia, unspecified: Secondary | ICD-10-CM | POA: Diagnosis not present

## 2022-01-13 DIAGNOSIS — I10 Essential (primary) hypertension: Secondary | ICD-10-CM | POA: Diagnosis not present

## 2022-01-13 DIAGNOSIS — R7989 Other specified abnormal findings of blood chemistry: Secondary | ICD-10-CM | POA: Diagnosis not present

## 2022-01-13 DIAGNOSIS — J309 Allergic rhinitis, unspecified: Secondary | ICD-10-CM | POA: Diagnosis not present

## 2022-01-13 DIAGNOSIS — R748 Abnormal levels of other serum enzymes: Secondary | ICD-10-CM | POA: Diagnosis not present

## 2022-01-13 DIAGNOSIS — E1129 Type 2 diabetes mellitus with other diabetic kidney complication: Secondary | ICD-10-CM | POA: Diagnosis not present

## 2022-01-13 DIAGNOSIS — I428 Other cardiomyopathies: Secondary | ICD-10-CM | POA: Diagnosis not present

## 2022-01-13 NOTE — Telephone Encounter (Signed)
I was able to reach patient to review results and recommendations per provider.  She is currently taking 80 meq of Potassium daily.  She is agreeable to take extra 40 meq x 2 doses as instructed and will return for follow-up lab work in 2 weeks per her request.

## 2022-01-13 NOTE — Telephone Encounter (Signed)
-----   Message from Dalton S McLean, MD sent at 01/08/2022  4:55 PM EDT ----- Take KCl 40 mEq x 2 doses today separated by 2 hours.  Then continue KCl at 40 mEq daily with BMET next week.  

## 2022-01-19 ENCOUNTER — Other Ambulatory Visit: Payer: Self-pay | Admitting: Family Medicine

## 2022-01-19 DIAGNOSIS — R7989 Other specified abnormal findings of blood chemistry: Secondary | ICD-10-CM

## 2022-01-21 ENCOUNTER — Ambulatory Visit: Payer: Medicare Other

## 2022-01-27 ENCOUNTER — Other Ambulatory Visit (HOSPITAL_COMMUNITY): Payer: Medicare Other

## 2022-02-11 ENCOUNTER — Ambulatory Visit: Payer: Medicare Other

## 2022-02-17 ENCOUNTER — Ambulatory Visit
Admission: RE | Admit: 2022-02-17 | Discharge: 2022-02-17 | Disposition: A | Discharge status: Home / Self Care | Payer: Medicare Other | Source: Ambulatory Visit | Attending: Family Medicine | Admitting: Family Medicine

## 2022-02-17 DIAGNOSIS — Z1231 Encounter for screening mammogram for malignant neoplasm of breast: Secondary | ICD-10-CM | POA: Diagnosis not present

## 2022-02-20 ENCOUNTER — Other Ambulatory Visit (HOSPITAL_COMMUNITY): Payer: Medicare Other

## 2022-02-23 DIAGNOSIS — M5416 Radiculopathy, lumbar region: Secondary | ICD-10-CM | POA: Diagnosis not present

## 2022-03-12 ENCOUNTER — Ambulatory Visit (HOSPITAL_COMMUNITY)
Admission: RE | Admit: 2022-03-12 | Discharge: 2022-03-12 | Disposition: A | Discharge status: Home / Self Care | Payer: Medicare Other | Source: Ambulatory Visit | Attending: Cardiology | Admitting: Cardiology

## 2022-03-12 DIAGNOSIS — I5022 Chronic systolic (congestive) heart failure: Secondary | ICD-10-CM | POA: Insufficient documentation

## 2022-03-12 LAB — BASIC METABOLIC PANEL
Anion gap: 7 (ref 5–15)
BUN: 15 mg/dL (ref 6–20)
CO2: 23 mmol/L (ref 22–32)
Calcium: 8.6 mg/dL — ABNORMAL LOW (ref 8.9–10.3)
Chloride: 114 mmol/L — ABNORMAL HIGH (ref 98–111)
Creatinine, Ser: 0.84 mg/dL (ref 0.44–1.00)
GFR, Estimated: >60 mL/min (ref >60)
Glucose, Bld: 141 mg/dL — ABNORMAL HIGH (ref 70–99)
Potassium: 3.2 mmol/L — ABNORMAL LOW (ref 3.5–5.1)
Sodium: 144 mmol/L (ref 135–145)

## 2022-03-19 ENCOUNTER — Other Ambulatory Visit (HOSPITAL_COMMUNITY): Payer: Self-pay | Admitting: *Deleted

## 2022-03-19 ENCOUNTER — Telehealth (HOSPITAL_COMMUNITY): Payer: Self-pay

## 2022-03-19 MED ORDER — POTASSIUM CHLORIDE 20 MEQ PO PACK: 100.0000 meq | PACK | Freq: Every day | ORAL | 3 refills | Status: DC

## 2022-03-19 NOTE — Telephone Encounter (Signed)
Pt requested refills of potassium packets I called pt back to get clarification. No answer/left vm for return call.

## 2022-03-19 NOTE — Telephone Encounter (Signed)
-----   Message from Larey Dresser, MD sent at 03/16/2022  3:05 PM EDT ----- OK to switch to packets then repeat BMET.  ----- Message ----- From: Shonna Chock, CMA Sent: [id number]   3:01 PM EDT To: Larey Dresser, MD  Patient advised and verbalized understanding,lab appointment scheduled,lab orders entered. Is it ok to change patient to the packets of K? She has a hard time swallowing all of the pills

## 2022-03-19 NOTE — Telephone Encounter (Signed)
Patient advised and verbalized understanding,lab appointment scheduled,lab orders entered. Is it ok to change patient to the packets of K? She has a hard time swallowing all of the pills  Meds ordered this encounter  Medications   potassium chloride (KLOR-CON) 20 MEQ packet    Sig: Take 100 mEq by mouth daily.    Dispense:  450 packet    Refill:  3    Please cancel all previous orders for current medication. Change in dosage or pill size.

## 2022-03-20 MED ORDER — POTASSIUM CHLORIDE 20 MEQ PO PACK: 100.0000 meq | PACK | Freq: Every day | ORAL | 3 refills | Status: DC

## 2022-03-20 NOTE — Addendum Note (Signed)
Addended by: Scarlette Calico on: 03/20/2022 02:32 PM   Modules accepted: Orders

## 2022-03-23 ENCOUNTER — Telehealth (HOSPITAL_COMMUNITY): Payer: Self-pay

## 2022-03-23 MED ORDER — POTASSIUM CHLORIDE ER 10 MEQ PO TBCR: 20.0000 meq | EXTENDED_RELEASE_TABLET | Freq: Every day | ORAL | 3 refills | Status: DC

## 2022-03-23 NOTE — Telephone Encounter (Addendum)
Spoke with Dr. Aundra Dubin ok to change potassium from 20 meq to 10 meq. Pt aware, agreeable, and verbalized understanding   ----- Message from Larey Dresser, MD sent at 03/12/2022 10:05 AM EDT ----- Increase total daily KCl by 20 mEq, BMET 10 days

## 2022-03-24 ENCOUNTER — Telehealth: Payer: Self-pay

## 2022-03-24 NOTE — Patient Outreach (Signed)
  Care Coordination   03/24/2022 Name: [name] MRN: [id number] DOB: October 11, 1970   Care Coordination Outreach Attempts:  An unsuccessful telephone outreach was attempted today to offer the patient information about available care coordination services as a benefit of their health plan.   Follow Up Plan:  Additional outreach attempts will be made to offer the patient care coordination information and services.   Encounter Outcome:  No Answer  Care Coordination Interventions Activated:  No   Care Coordination Interventions:  No, not indicated    Tomasa Rand, RN, BSN, CEN [facility] [employer] [phone]

## 2022-03-26 ENCOUNTER — Telehealth (HOSPITAL_COMMUNITY): Payer: Self-pay | Admitting: *Deleted

## 2022-03-26 NOTE — Telephone Encounter (Signed)
Pr r/s lab appt because she just started K yesterday. Lab appt scheduled for 04/03/22

## 2022-03-27 ENCOUNTER — Other Ambulatory Visit (HOSPITAL_COMMUNITY): Payer: Medicare Other

## 2022-03-30 ENCOUNTER — Telehealth: Payer: Self-pay

## 2022-03-30 NOTE — Patient Outreach (Signed)
  Care Coordination   Initial Visit Note   03/30/2022 Name: [name] MRN: [id number] DOB: 1971/04/23  [name] is a 51 y.o. year old female who sees Hamrick, Lorin Mercy, MD for primary care. I spoke with  [name] by phone today.  What matters to the patients health and wellness today?  Placed call to patient to review and offer [facility] care coordination program. Patient reports that she is doing well and denies any assistance needed at this time.  SDOH assessments and interventions completed:  No     Care Coordination Interventions Activated:  No  Care Coordination Interventions:  No, not indicated   Follow up plan: No further intervention required.   Encounter Outcome:  Pt. Visit Completed   Tomasa Rand, RN, BSN, CEN [place] Coordinator [phone]

## 2022-04-03 ENCOUNTER — Other Ambulatory Visit (HOSPITAL_COMMUNITY): Payer: Medicare Other

## 2022-04-06 ENCOUNTER — Other Ambulatory Visit (HOSPITAL_COMMUNITY): Payer: Medicare Other

## 2022-04-07 ENCOUNTER — Encounter: Payer: Self-pay | Admitting: Gastroenterology

## 2022-04-09 ENCOUNTER — Other Ambulatory Visit (HOSPITAL_COMMUNITY): Payer: Medicare Other

## 2022-04-09 DIAGNOSIS — M5136 Other intervertebral disc degeneration, lumbar region: Secondary | ICD-10-CM | POA: Diagnosis not present

## 2022-04-09 DIAGNOSIS — Z9689 Presence of other specified functional implants: Secondary | ICD-10-CM | POA: Diagnosis not present

## 2022-04-09 DIAGNOSIS — M5416 Radiculopathy, lumbar region: Secondary | ICD-10-CM | POA: Diagnosis not present

## 2022-04-10 ENCOUNTER — Other Ambulatory Visit: Payer: Self-pay

## 2022-04-13 ENCOUNTER — Ambulatory Visit (HOSPITAL_COMMUNITY)
Admission: RE | Admit: 2022-04-13 | Discharge: 2022-04-13 | Disposition: A | Discharge status: Home / Self Care | Payer: Medicare Other | Source: Ambulatory Visit | Attending: Cardiology | Admitting: Cardiology

## 2022-04-13 ENCOUNTER — Other Ambulatory Visit: Payer: Self-pay

## 2022-04-13 DIAGNOSIS — I5022 Chronic systolic (congestive) heart failure: Secondary | ICD-10-CM | POA: Insufficient documentation

## 2022-04-13 LAB — BASIC METABOLIC PANEL
Anion gap: 7 (ref 5–15)
BUN: 25 mg/dL — ABNORMAL HIGH (ref 6–20)
CO2: 23 mmol/L (ref 22–32)
Calcium: 8.6 mg/dL — ABNORMAL LOW (ref 8.9–10.3)
Chloride: 114 mmol/L — ABNORMAL HIGH (ref 98–111)
Creatinine, Ser: 1.16 mg/dL — ABNORMAL HIGH (ref 0.44–1.00)
GFR, Estimated: 57 mL/min — ABNORMAL LOW (ref >60)
Glucose, Bld: 138 mg/dL — ABNORMAL HIGH (ref 70–99)
Potassium: 3.2 mmol/L — ABNORMAL LOW (ref 3.5–5.1)
Sodium: 144 mmol/L (ref 135–145)

## 2022-04-14 ENCOUNTER — Encounter: Payer: Self-pay | Admitting: Hematology

## 2022-04-14 ENCOUNTER — Other Ambulatory Visit: Payer: Self-pay

## 2022-04-14 MED ORDER — OZEMPIC (1 MG/DOSE) 4 MG/3ML ~~LOC~~ SOPN
PEN_INJECTOR | SUBCUTANEOUS | 0 refills | Status: DC
  Filled 2022-04-14: qty 6, 28d supply, fill #0

## 2022-04-15 ENCOUNTER — Other Ambulatory Visit: Payer: Self-pay

## 2022-04-15 MED ORDER — OZEMPIC (2 MG/DOSE) 8 MG/3ML ~~LOC~~ SOPN
2.0000 mg | PEN_INJECTOR | SUBCUTANEOUS | 3 refills | Status: DC
  Filled 2022-04-15: qty 3, 28d supply, fill #0
  Filled 2022-05-15 (×2): qty 3, 28d supply, fill #1
  Filled 2022-06-09 (×2): qty 3, 28d supply, fill #2
  Filled 2022-07-08: qty 3, 28d supply, fill #3

## 2022-04-16 ENCOUNTER — Other Ambulatory Visit: Payer: Self-pay

## 2022-04-17 ENCOUNTER — Telehealth (HOSPITAL_COMMUNITY): Payer: Self-pay

## 2022-04-17 DIAGNOSIS — I5022 Chronic systolic (congestive) heart failure: Secondary | ICD-10-CM

## 2022-04-17 MED ORDER — POTASSIUM CHLORIDE ER 10 MEQ PO TBCR: 40.0000 meq | EXTENDED_RELEASE_TABLET | Freq: Every day | ORAL | 3 refills | Status: DC

## 2022-04-17 NOTE — Telephone Encounter (Signed)
Patient advised and verbalized understanding,lab appointment scheduled,lab orders entered, med list updated to reflect changes.   Meds ordered this encounter  Medications   potassium chloride (KLOR-CON) 10 MEQ tablet    Sig: Take 4 tablets (40 mEq total) by mouth daily.    Dispense:  360 tablet    Refill:  3    Please cancel all previous orders for current medication. Change in dosage or pill size.   Orders Placed This Encounter  Procedures   Basic metabolic panel    Standing Status:   Future    Standing Expiration Date:   04/18/2023    Order Specific Question:   Release to patient    Answer:   Immediate    Order Specific Question:   Release to patient    Answer:   Immediate [1]

## 2022-04-17 NOTE — Telephone Encounter (Signed)
-----   Message from Larey Dresser, MD sent at 04/13/2022  3:37 PM EDT ----- Increase total daily KCl in regimen by 20 mEq.  BMET 10 days.

## 2022-04-22 ENCOUNTER — Other Ambulatory Visit: Payer: Self-pay

## 2022-04-22 DIAGNOSIS — I10 Essential (primary) hypertension: Secondary | ICD-10-CM | POA: Diagnosis not present

## 2022-04-22 DIAGNOSIS — E785 Hyperlipidemia, unspecified: Secondary | ICD-10-CM | POA: Diagnosis not present

## 2022-04-22 DIAGNOSIS — R748 Abnormal levels of other serum enzymes: Secondary | ICD-10-CM | POA: Diagnosis not present

## 2022-04-22 DIAGNOSIS — I509 Heart failure, unspecified: Secondary | ICD-10-CM | POA: Diagnosis not present

## 2022-04-22 DIAGNOSIS — M549 Dorsalgia, unspecified: Secondary | ICD-10-CM | POA: Diagnosis not present

## 2022-04-22 DIAGNOSIS — I428 Other cardiomyopathies: Secondary | ICD-10-CM | POA: Diagnosis not present

## 2022-04-22 DIAGNOSIS — J309 Allergic rhinitis, unspecified: Secondary | ICD-10-CM | POA: Diagnosis not present

## 2022-04-22 DIAGNOSIS — D509 Iron deficiency anemia, unspecified: Secondary | ICD-10-CM | POA: Diagnosis not present

## 2022-04-22 DIAGNOSIS — E1129 Type 2 diabetes mellitus with other diabetic kidney complication: Secondary | ICD-10-CM | POA: Diagnosis not present

## 2022-04-22 DIAGNOSIS — Z794 Long term (current) use of insulin: Secondary | ICD-10-CM | POA: Diagnosis not present

## 2022-04-22 DIAGNOSIS — R809 Proteinuria, unspecified: Secondary | ICD-10-CM | POA: Diagnosis not present

## 2022-04-27 ENCOUNTER — Telehealth (HOSPITAL_COMMUNITY): Payer: Self-pay

## 2022-04-27 ENCOUNTER — Ambulatory Visit (HOSPITAL_COMMUNITY)
Admission: RE | Admit: 2022-04-27 | Discharge: 2022-04-27 | Disposition: A | Discharge status: Home / Self Care | Payer: Medicare Other | Source: Ambulatory Visit | Attending: Cardiology | Admitting: Cardiology

## 2022-04-27 ENCOUNTER — Encounter: Payer: Self-pay | Admitting: Hematology

## 2022-04-27 DIAGNOSIS — I5022 Chronic systolic (congestive) heart failure: Secondary | ICD-10-CM | POA: Diagnosis not present

## 2022-04-27 LAB — BASIC METABOLIC PANEL
Anion gap: 7 (ref 5–15)
BUN: 12 mg/dL (ref 6–20)
CO2: 22 mmol/L (ref 22–32)
Calcium: 8.3 mg/dL — ABNORMAL LOW (ref 8.9–10.3)
Chloride: 114 mmol/L — ABNORMAL HIGH (ref 98–111)
Creatinine, Ser: 0.8 mg/dL (ref 0.44–1.00)
GFR, Estimated: >60 mL/min (ref >60)
Glucose, Bld: 159 mg/dL — ABNORMAL HIGH (ref 70–99)
Potassium: 3.1 mmol/L — ABNORMAL LOW (ref 3.5–5.1)
Sodium: 143 mmol/L (ref 135–145)

## 2022-04-27 MED ORDER — POTASSIUM CHLORIDE ER 10 MEQ PO TBCR: 60.0000 meq | EXTENDED_RELEASE_TABLET | Freq: Every day | ORAL | 3 refills | Status: DC

## 2022-04-27 NOTE — Telephone Encounter (Addendum)
Pt aware, agreeable, and verbalized understanding  Labs scheduled   ----- Message from Laurey Morale, MD sent at 04/27/2022  1:56 PM EST ----- Increase total daily KCl by 20 mEq.  BMET 10 days.

## 2022-05-11 ENCOUNTER — Other Ambulatory Visit (HOSPITAL_COMMUNITY): Payer: Medicare Other

## 2022-05-15 ENCOUNTER — Other Ambulatory Visit: Payer: Self-pay

## 2022-05-15 ENCOUNTER — Other Ambulatory Visit (HOSPITAL_COMMUNITY): Payer: Self-pay

## 2022-05-19 ENCOUNTER — Other Ambulatory Visit: Payer: Self-pay

## 2022-05-20 ENCOUNTER — Other Ambulatory Visit: Payer: Self-pay

## 2022-05-21 ENCOUNTER — Ambulatory Visit (HOSPITAL_COMMUNITY)
Admission: RE | Admit: 2022-05-21 | Discharge: 2022-05-21 | Disposition: A | Discharge status: Home / Self Care | Payer: Medicare Other | Source: Ambulatory Visit | Attending: Family Medicine | Admitting: Family Medicine

## 2022-05-21 ENCOUNTER — Other Ambulatory Visit: Payer: Self-pay

## 2022-05-21 ENCOUNTER — Encounter (HOSPITAL_COMMUNITY): Payer: Self-pay | Admitting: Cardiology

## 2022-05-21 ENCOUNTER — Ambulatory Visit (HOSPITAL_BASED_OUTPATIENT_CLINIC_OR_DEPARTMENT_OTHER)
Admission: RE | Admit: 2022-05-21 | Discharge: 2022-05-21 | Disposition: A | Discharge status: Home / Self Care | Payer: Medicare Other | Source: Ambulatory Visit | Attending: Cardiology | Admitting: Cardiology

## 2022-05-21 VITALS — BP 110/70 | HR 86 | Wt 161.0 lb

## 2022-05-21 DIAGNOSIS — I2510 Atherosclerotic heart disease of native coronary artery without angina pectoris: Secondary | ICD-10-CM | POA: Insufficient documentation

## 2022-05-21 DIAGNOSIS — E669 Obesity, unspecified: Secondary | ICD-10-CM | POA: Diagnosis not present

## 2022-05-21 DIAGNOSIS — I5022 Chronic systolic (congestive) heart failure: Secondary | ICD-10-CM

## 2022-05-21 DIAGNOSIS — Z7984 Long term (current) use of oral hypoglycemic drugs: Secondary | ICD-10-CM | POA: Insufficient documentation

## 2022-05-21 DIAGNOSIS — Z79899 Other long term (current) drug therapy: Secondary | ICD-10-CM | POA: Diagnosis not present

## 2022-05-21 DIAGNOSIS — Z6826 Body mass index (BMI) 26.0-26.9, adult: Secondary | ICD-10-CM | POA: Diagnosis not present

## 2022-05-21 DIAGNOSIS — I428 Other cardiomyopathies: Secondary | ICD-10-CM | POA: Insufficient documentation

## 2022-05-21 DIAGNOSIS — G4733 Obstructive sleep apnea (adult) (pediatric): Secondary | ICD-10-CM | POA: Insufficient documentation

## 2022-05-21 DIAGNOSIS — E876 Hypokalemia: Secondary | ICD-10-CM | POA: Insufficient documentation

## 2022-05-21 DIAGNOSIS — I110 Hypertensive heart disease with heart failure: Secondary | ICD-10-CM | POA: Diagnosis not present

## 2022-05-21 LAB — COMPREHENSIVE METABOLIC PANEL
ALT: 28 U/L (ref 0–44)
AST: 24 U/L (ref 15–41)
Albumin: 3.1 g/dL — ABNORMAL LOW (ref 3.5–5.0)
Alkaline Phosphatase: 140 U/L — ABNORMAL HIGH (ref 38–126)
Anion gap: 6 (ref 5–15)
BUN: 16 mg/dL (ref 6–20)
CO2: 24 mmol/L (ref 22–32)
Calcium: 8.4 mg/dL — ABNORMAL LOW (ref 8.9–10.3)
Chloride: 112 mmol/L — ABNORMAL HIGH (ref 98–111)
Creatinine, Ser: 0.78 mg/dL (ref 0.44–1.00)
GFR, Estimated: >60 mL/min (ref >60)
Glucose, Bld: 130 mg/dL — ABNORMAL HIGH (ref 70–99)
Potassium: 3.1 mmol/L — ABNORMAL LOW (ref 3.5–5.1)
Sodium: 142 mmol/L (ref 135–145)
Total Bilirubin: 0.4 mg/dL (ref 0.3–1.2)
Total Protein: 6.1 g/dL — ABNORMAL LOW (ref 6.5–8.1)

## 2022-05-21 LAB — BRAIN NATRIURETIC PEPTIDE: B Natriuretic Peptide: 16.5 pg/mL (ref 0.0–100.0)

## 2022-05-21 LAB — ECHOCARDIOGRAM COMPLETE
Area-P 1/2: 4.68 cm2
Calc EF: 45.8 %
S' Lateral: 3.3 cm
Single Plane A2C EF: 45.2 %
Single Plane A4C EF: 46.8 %

## 2022-05-21 MED ORDER — SPIRONOLACTONE 25 MG PO TABS: 12.5000 mg | ORAL_TABLET | Freq: Every day | ORAL | 3 refills | Status: DC

## 2022-05-21 MED ORDER — TORSEMIDE 20 MG PO TABS: 40.0000 mg | ORAL_TABLET | Freq: Every day | ORAL | 3 refills | Status: DC

## 2022-05-21 NOTE — Progress Notes (Signed)
  Echocardiogram 2D Echocardiogram has been performed.  Delcie Roch 05/21/2022, 8:50 AM

## 2022-05-21 NOTE — Patient Instructions (Addendum)
Start Spironolactone 12.5 mg ( 1/2 Tab) daily.  DECREASE Torsemide to 20 mg ( 1 Tab) daily.  Labs done today, your results will be available in MyChart, we will contact you for abnormal readings.  Repeat blood work in 10 days and again in 3 months  PLEASE AVOID CLONIDINE USE   Your physician recommends that you schedule a follow-up appointment in: 6 months ( June 2024)  ** please call the office April to arrange your follow up appointment **  If you have any questions or concerns before your next appointment please send [place] a message through [place] or call our office at [phone].    TO LEAVE A MESSAGE FOR THE NURSE SELECT OPTION 2, PLEASE LEAVE A MESSAGE INCLUDING: YOUR NAME DATE OF BIRTH CALL BACK NUMBER REASON FOR CALL**this is important as we prioritize the call backs  YOU WILL RECEIVE A CALL BACK THE SAME DAY AS LONG AS YOU CALL BEFORE 4:00 PM  At the [facility], you and your health needs are our priority. As part of our continuing mission to provide you with exceptional heart care, we have created designated Provider Care Teams. These Care Teams include your primary Cardiologist (physician) and Advanced Practice Providers (APPs- Physician Assistants and Nurse Practitioners) who all work together to provide you with the care you need, when you need it.   You may see any of the following providers on your designated Care Team at your next follow up: Dr Arvilla Meres Dr Marca Ancona Dr. Marcos Eke, NP Robbie Lis, [place] [facility] [place], [place] Brynda Peon, NP Karle Plumber, PharmD   Please be sure to bring in all your medications bottles to every appointment.
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
## 2022-05-26 ENCOUNTER — Telehealth (HOSPITAL_COMMUNITY): Payer: Self-pay

## 2022-05-26 DIAGNOSIS — M5416 Radiculopathy, lumbar region: Secondary | ICD-10-CM | POA: Diagnosis not present

## 2022-05-26 NOTE — Telephone Encounter (Signed)
Patient aware and agreeable. Lab visit already scheduled

## 2022-06-01 ENCOUNTER — Other Ambulatory Visit (HOSPITAL_COMMUNITY): Payer: Medicare Other

## 2022-06-09 ENCOUNTER — Other Ambulatory Visit: Payer: Self-pay

## 2022-06-16 ENCOUNTER — Other Ambulatory Visit: Payer: Self-pay

## 2022-07-08 ENCOUNTER — Other Ambulatory Visit: Payer: Self-pay

## 2022-07-08 DIAGNOSIS — E113593 Type 2 diabetes mellitus with proliferative diabetic retinopathy without macular edema, bilateral: Secondary | ICD-10-CM | POA: Diagnosis not present

## 2022-07-08 DIAGNOSIS — H31093 Other chorioretinal scars, bilateral: Secondary | ICD-10-CM | POA: Diagnosis not present

## 2022-07-08 DIAGNOSIS — H3582 Retinal ischemia: Secondary | ICD-10-CM | POA: Diagnosis not present

## 2022-07-08 DIAGNOSIS — H4311 Vitreous hemorrhage, right eye: Secondary | ICD-10-CM | POA: Diagnosis not present

## 2022-07-09 ENCOUNTER — Other Ambulatory Visit: Payer: Self-pay

## 2022-07-09 MED ORDER — OZEMPIC (2 MG/DOSE) 8 MG/3ML ~~LOC~~ SOPN
2.0000 mg | PEN_INJECTOR | SUBCUTANEOUS | 3 refills | Status: DC
  Filled 2022-07-09: qty 3, 28d supply, fill #0

## 2022-07-13 ENCOUNTER — Other Ambulatory Visit: Payer: Self-pay

## 2022-07-15 ENCOUNTER — Other Ambulatory Visit: Payer: Self-pay

## 2022-07-21 ENCOUNTER — Encounter: Payer: Self-pay | Admitting: Hematology

## 2022-07-27 DIAGNOSIS — M5136 Other intervertebral disc degeneration, lumbar region: Secondary | ICD-10-CM | POA: Diagnosis not present

## 2022-07-27 DIAGNOSIS — Z9689 Presence of other specified functional implants: Secondary | ICD-10-CM | POA: Diagnosis not present

## 2022-07-27 DIAGNOSIS — M5416 Radiculopathy, lumbar region: Secondary | ICD-10-CM | POA: Diagnosis not present

## 2022-08-20 ENCOUNTER — Ambulatory Visit (HOSPITAL_COMMUNITY)
Admission: RE | Admit: 2022-08-20 | Discharge: 2022-08-20 | Disposition: A | Discharge status: Home / Self Care | Payer: 59 | Source: Ambulatory Visit | Attending: Internal Medicine | Admitting: Internal Medicine

## 2022-08-20 DIAGNOSIS — I5022 Chronic systolic (congestive) heart failure: Secondary | ICD-10-CM | POA: Diagnosis not present

## 2022-08-20 LAB — BASIC METABOLIC PANEL
Anion gap: 9 (ref 5–15)
BUN: 17 mg/dL (ref 6–20)
CO2: 22 mmol/L (ref 22–32)
Calcium: 8.6 mg/dL — ABNORMAL LOW (ref 8.9–10.3)
Chloride: 112 mmol/L — ABNORMAL HIGH (ref 98–111)
Creatinine, Ser: 1.06 mg/dL — ABNORMAL HIGH (ref 0.44–1.00)
GFR, Estimated: >60 mL/min (ref >60)
Glucose, Bld: 135 mg/dL — ABNORMAL HIGH (ref 70–99)
Potassium: 3.6 mmol/L (ref 3.5–5.1)
Sodium: 143 mmol/L (ref 135–145)

## 2022-08-24 DIAGNOSIS — I509 Heart failure, unspecified: Secondary | ICD-10-CM | POA: Diagnosis not present

## 2022-08-24 DIAGNOSIS — E1121 Type 2 diabetes mellitus with diabetic nephropathy: Secondary | ICD-10-CM | POA: Diagnosis not present

## 2022-08-24 DIAGNOSIS — D509 Iron deficiency anemia, unspecified: Secondary | ICD-10-CM | POA: Diagnosis not present

## 2022-08-24 DIAGNOSIS — E785 Hyperlipidemia, unspecified: Secondary | ICD-10-CM | POA: Diagnosis not present

## 2022-08-24 DIAGNOSIS — M549 Dorsalgia, unspecified: Secondary | ICD-10-CM | POA: Diagnosis not present

## 2022-08-24 DIAGNOSIS — I428 Other cardiomyopathies: Secondary | ICD-10-CM | POA: Diagnosis not present

## 2022-08-24 DIAGNOSIS — J309 Allergic rhinitis, unspecified: Secondary | ICD-10-CM | POA: Diagnosis not present

## 2022-08-24 DIAGNOSIS — I10 Essential (primary) hypertension: Secondary | ICD-10-CM | POA: Diagnosis not present

## 2022-08-24 DIAGNOSIS — G8929 Other chronic pain: Secondary | ICD-10-CM | POA: Diagnosis not present

## 2022-08-24 DIAGNOSIS — R748 Abnormal levels of other serum enzymes: Secondary | ICD-10-CM | POA: Diagnosis not present

## 2022-08-27 ENCOUNTER — Encounter: Payer: Self-pay | Admitting: Hematology

## 2022-09-29 DIAGNOSIS — H43823 Vitreomacular adhesion, bilateral: Secondary | ICD-10-CM | POA: Diagnosis not present

## 2022-09-29 DIAGNOSIS — H3582 Retinal ischemia: Secondary | ICD-10-CM | POA: Diagnosis not present

## 2022-09-29 DIAGNOSIS — H31093 Other chorioretinal scars, bilateral: Secondary | ICD-10-CM | POA: Diagnosis not present

## 2022-09-29 DIAGNOSIS — E113591 Type 2 diabetes mellitus with proliferative diabetic retinopathy without macular edema, right eye: Secondary | ICD-10-CM | POA: Diagnosis not present

## 2022-09-29 DIAGNOSIS — E113512 Type 2 diabetes mellitus with proliferative diabetic retinopathy with macular edema, left eye: Secondary | ICD-10-CM | POA: Diagnosis not present

## 2022-09-30 DIAGNOSIS — M5416 Radiculopathy, lumbar region: Secondary | ICD-10-CM | POA: Diagnosis not present

## 2022-10-06 ENCOUNTER — Other Ambulatory Visit: Payer: Self-pay

## 2022-10-12 NOTE — Progress Notes (Signed)
 This encounter was created in error - please disregard.

## 2022-10-12 NOTE — Patient Instructions (Signed)

## 2022-10-15 ENCOUNTER — Encounter: Payer: 59 | Admitting: Primary Care

## 2022-10-26 DIAGNOSIS — Z9689 Presence of other specified functional implants: Secondary | ICD-10-CM | POA: Diagnosis not present

## 2022-10-26 DIAGNOSIS — M5136 Other intervertebral disc degeneration, lumbar region: Secondary | ICD-10-CM | POA: Diagnosis not present

## 2022-11-02 ENCOUNTER — Encounter: Payer: Self-pay | Admitting: Primary Care

## 2022-11-02 ENCOUNTER — Ambulatory Visit (INDEPENDENT_AMBULATORY_CARE_PROVIDER_SITE_OTHER): Payer: 59 | Admitting: Primary Care

## 2022-11-02 ENCOUNTER — Encounter: Payer: Self-pay | Admitting: Hematology

## 2022-11-02 VITALS — BP 120/70 | HR 81 | Temp 98.3°F | Ht 65.0 in | Wt 151.4 lb

## 2022-11-02 DIAGNOSIS — G4733 Obstructive sleep apnea (adult) (pediatric): Secondary | ICD-10-CM | POA: Diagnosis not present

## 2022-11-02 NOTE — Progress Notes (Signed)
@Patient  ID: [name], female    DOB: 08/10/1970, 52 y.o.   MRN: [id number]  Chief Complaint  Patient presents with   Consult    Broken CPAP.  Not used x 1-2 months.    Referring provider: [name], MD  HPI: 52 year old female, never smoked. PMH significant for CHF, HTN, OSA, asthma, reflux, type 2 diabetes, iron deficiency anemia, opioid dependency.   11/02/2022 Patient present today for sleep consult, she has a history of mild obstructive sleep apnea.  Sleep study on 07/01/2018 with an AHI 6.1/hour.  She had CPAP titration study on 08/18/2018 with optimal pressure of 9 cm H2O.  She was well controlled on CPAP up until recently when her machine broke.  Tolerated CPAP well with noticeable improvement in clinical symptoms.  She last used CPAP 1 to 2 months ago.  She reports increased symptoms of daytime fatigue and snoring off PAP therapy. DME is [facility].  Sleep questionnaire Symptoms-  Does not sleep well without CPAP, daytime sleepiness   Prior sleep study- 07/01/2018 with an AHI 6.1/hour. Bedtime- 10pm-12am Time to fall asleep- not long with CPAP; 30 mins without  Nocturnal awakenings- 2-3 times to use bathroom  Out of bed/start of day- 9am- 10am  Weight changes- Down 70 lbs  Do you operate heavy machinery- No Do you currently wear CPAP- Yes, machine is broken  Do you current wear oxygen- no Epworth- 18  Allergies  Allergen Reactions   Imitrex [Sumatriptan] Other (See Comments)    Acid reflux---pill only-- shots are okay    Lamictal [Lamotrigine] Nausea And Vomiting   Metformin And Related Diarrhea    Immunization History  Administered Date(s) Administered   Influenza Inj Mdck Quad Pf 02/24/2018   Influenza Split 03/08/2012, 03/18/2013   Influenza,inj,Quad PF,6+ Mos 01/25/2017, 01/30/2019   Influenza,inj,quad, With Preservative 02/15/2015   Influenza-Unspecified 02/13/2014   Pneumococcal Conjugate-13 01/25/2017   Pneumococcal Polysaccharide-23 12/31/2010,  07/28/2011, 03/16/2012    Past Medical History:  Diagnosis Date   Anemia    hx of iron transfusions - last one 10/2013    Anxiety    Arthritis    neck and shoulders    Asthma    Back pain, chronic    Bipolar disorder (HCC)    Carpal tunnel syndrome on both sides    Cataract    Depression    Diabetes mellitus    Fibromyalgia    GERD (gastroesophageal reflux disease)    History of gastric bypass Jan 2013   Hyperlipidemia    Hypertension    Insomnia    Migraines    Nervous breakdown sept 2013   Neuropathy    Nonischemic cardiomyopathy (HCC)    OSA (obstructive sleep apnea) 02/21/2015   Peripheral vascular disease (HCC)    Shortness of breath    with exertion    Sleep apnea    borderline- no cpap yet   Systolic CHF, acute on chronic (HCC)    CARDIOLOGIST-- DR Aris Lot NELSON  ([place])   Wound of left breast     Tobacco History: Social History   Tobacco Use  Smoking Status Never  Smokeless Tobacco Never   Counseling given: Not Answered   Outpatient Medications Prior to Visit  Medication Sig Dispense Refill   ACCU-CHEK GUIDE test strip 1 each by Other route 3 (three) times daily.     albuterol (VENTOLIN HFA) 108 (90 Base) MCG/ACT inhaler Inhale 2 puffs into the lungs every 4 (four) hours as needed for wheezing  or shortness of breath.     Biotin 5000 MCG TABS Take 5,000 mcg by mouth daily.     Cholecalciferol (VITAMIN D3) 5000 units TABS Take 5,000 Units by mouth daily.      cloNIDine (CATAPRES) 0.1 MG tablet Take 0.1 mg by mouth 2 (two) times daily.     cyclobenzaprine (FLEXERIL) 10 MG tablet Take 10 mg by mouth 3 (three) times daily as needed for muscle spasms.      diclofenac sodium (VOLTAREN) 1 % GEL Apply 2 g topically 2 (two) times daily.     DULoxetine (CYMBALTA) 30 MG capsule Take 30 mg by mouth daily.     DULoxetine (CYMBALTA) 60 MG capsule Take 60 mg by mouth daily.     ENTRESTO 24-26 MG TAKE 1 TABLET BY MOUTH TWICE A DAY 180 tablet 3   famotidine (PEPCID)  20 MG tablet Take 20 mg by mouth daily.     fluticasone (FLONASE) 50 MCG/ACT nasal spray Place 1 spray into both nostrils daily.      ibuprofen (ADVIL,MOTRIN) 800 MG tablet Take 800 mg by mouth every 8 (eight) hours as needed (pain).     JARDIANCE 10 MG TABS tablet Take 10 mg by mouth daily.     lubiprostone (AMITIZA) 24 MCG capsule Take 24 mcg by mouth 2 (two) times daily.     metoprolol succinate (TOPROL-XL) 50 MG 24 hr tablet TAKE 1 TABLET BY MOUTH EVERY DAY 90 tablet 3   nystatin ointment (MYCOSTATIN) nystatin 100,000 unit/gram topical ointment  APPLY TO AFFECTED AREA TWICE A DAY TO RASH     ondansetron (ZOFRAN) 4 MG tablet TAKE 1 TABLET BY MOUTH EVERY 8 HOURS AS NEEDED FOR NAUSEA AND VOMITING 20 tablet 0   oxyCODONE-acetaminophen (PERCOCET) 10-325 MG per tablet Take 0.5-1 tablets by mouth every 4 (four) hours as needed for pain.     potassium chloride (KLOR-CON) 10 MEQ tablet Take 6 tablets (60 mEq total) by mouth daily. 360 tablet 3   pregabalin (LYRICA) 150 MG capsule Take 150 mg by mouth 3 (three) times daily.     promethazine (PHENERGAN) 25 MG tablet promethazine 25 mg tablet  TAKE 1/2 TO 1 TABLET BY MOUTH EVERY 6 HOURS AS NEEDED FOR NAUSEA     rosuvastatin (CRESTOR) 10 MG tablet Take 1 tablet (10 mg total) by mouth daily. 90 tablet 3   Semaglutide, 2 MG/DOSE, (OZEMPIC, 2 MG/DOSE,) 8 MG/3ML SOPN Inject 2 mg into the skin once a week. 3 mL 3   solifenacin (VESICARE) 5 MG tablet solifenacin 5 mg tablet  TAKE 1 (ONE) TABLET DAILY FOR BLADDER CONTROL     spironolactone (ALDACTONE) 25 MG tablet Take 0.5 tablets (12.5 mg total) by mouth daily. 45 tablet 3   torsemide (DEMADEX) 20 MG tablet Take 2 tablets (40 mg total) by mouth daily. 90 tablet 3   Semaglutide, 2 MG/DOSE, (OZEMPIC, 2 MG/DOSE,) 8 MG/3ML SOPN Inject 2 mg into the skin once a week. (Patient not taking: Reported on 11/02/2022) 3 mL 3   No facility-administered medications prior to visit.    Review of Systems  Review of  Systems  Constitutional:  Positive for fatigue.  HENT: Negative.    Respiratory: Negative.    Cardiovascular: Negative.   Psychiatric/Behavioral:  Positive for sleep disturbance.    Physical Exam  BP 120/70 (BP Location: Left Arm, Patient Position: Sitting, Cuff Size: Normal)   Pulse 81   Temp 98.3 F (36.8 C) (Oral)   Ht 5\' 5"  (1.651 m)  Wt 151 lb 6.4 oz (68.7 kg)   SpO2 98%   BMI 25.19 kg/m  Physical Exam Constitutional:      Appearance: Normal appearance.  HENT:     Head: Normocephalic and atraumatic.     Mouth/Throat:     Mouth: Mucous membranes are moist.     Pharynx: Oropharynx is clear.  Cardiovascular:     Rate and Rhythm: Normal rate and regular rhythm.  Pulmonary:     Effort: Pulmonary effort is normal.     Breath sounds: Normal breath sounds.  Skin:    General: Skin is warm and dry.  Neurological:     General: No focal deficit present.     Mental Status: She is alert and oriented to person, place, and time. Mental status is at baseline.  Psychiatric:        Mood and Affect: Mood normal.        Behavior: Behavior normal.        Thought Content: Thought content normal.        Judgment: Judgment normal.      Lab Results:  CBC    Component Value Date/Time   WBC 7.6 03/12/2021 0809   RBC 4.37 03/12/2021 0809   HGB 13.1 03/12/2021 0809   HGB 11.5 (L) 03/24/2017 0842   HCT 40.0 03/12/2021 0809   HCT 34.5 (L) 03/24/2017 0842   PLT 211 03/12/2021 0809   PLT 235 03/24/2017 0842   MCV 91.5 03/12/2021 0809   MCV 90.1 03/24/2017 0842   MCH 30.0 03/12/2021 0809   MCHC 32.8 03/12/2021 0809   RDW 13.1 03/12/2021 0809   RDW 12.9 03/24/2017 0842   LYMPHSABS 1.9 03/12/2021 0809   LYMPHSABS 1.8 03/24/2017 0842   MONOABS 0.6 03/12/2021 0809   MONOABS 0.7 03/24/2017 0842   EOSABS 0.4 03/12/2021 0809   EOSABS 0.2 03/24/2017 0842   BASOSABS 0.1 03/12/2021 0809   BASOSABS 0.0 03/24/2017 0842    BMET    Component Value Date/Time   NA 143 08/20/2022 0834    NA 143 11/25/2017 1027   NA 144 01/07/2016 1225   K 3.6 08/20/2022 0834   K 3.0 (LL) 01/07/2016 1225   CL 112 (H) 08/20/2022 0834   CO2 22 08/20/2022 0834   CO2 27 01/07/2016 1225   GLUCOSE 135 (H) 08/20/2022 0834   GLUCOSE 60 (L) 01/07/2016 1225   BUN 17 08/20/2022 0834   BUN 13 11/25/2017 1027   BUN 16.2 01/07/2016 1225   CREATININE 1.06 (H) 08/20/2022 0834   CREATININE 0.9 01/07/2016 1225   CALCIUM 8.6 (L) 08/20/2022 0834   CALCIUM 9.1 01/07/2016 1225   GFRNONAA >60 08/20/2022 0834   GFRAA >60 08/07/2019 0942    BNP    Component Value Date/Time   BNP 16.5 05/21/2022 1008    ProBNP    Component Value Date/Time   PROBNP 16.5 05/30/2014 1222    Imaging: No results found.   Assessment & Plan:   OSA (obstructive sleep apnea) - Patient has very mild OSA but has significant clinical symptoms which improved with PAP therapy. She had CPAP titration on 08/18/18 and optimal pressure was 9cm h20. Her CPAP machine broke 1-2 months ago and has been unable to use. She reports increased daytime sleepiness without use. Epworth score is 18. DME order placed for patient to have her CPAP machine serviced or replaced. She was provided with [employer] contact information and advised to reach out to them. Needs follow-up in 6-8 weeks after resuming CPAP  use for compliance check.      Glenford Bayley, NP 11/02/2022

## 2022-11-02 NOTE — Assessment & Plan Note (Signed)
-   Patient has very mild OSA but has significant clinical symptoms which improved with PAP therapy. She had CPAP titration on 08/18/18 and optimal pressure was 9cm h20. Her CPAP machine broke 1-2 months ago and has been unable to use. She reports increased daytime sleepiness without use. Epworth score is 18. DME order placed for patient to have her CPAP machine serviced or replaced. She was provided with [employer] contact information and advised to reach out to them. Needs follow-up in 6-8 weeks after resuming CPAP use for compliance check.

## 2022-11-02 NOTE — Patient Instructions (Addendum)
Please bring CPAP unit by [employer] Address: [address], [place] Phone number: [phone]  Recommendations: Focus on side sleeping position or elevate head while sleeping (avoid sleeping directly on your back) Do not drive if tired Avoid alcohol or sedatives prior to bedtime as these can worse sleep apnea  Once you get CPAP fixed resume wearing nightly 4-6 hours or longer  Orders: DME order for new/replacement CPAP machine (ordered)  Follow-up: 8 weeks with Beth NP   CPAP and BIPAP Information CPAP and BIPAP are methods that use air pressure to keep your airways open and to help you breathe well. CPAP and BIPAP use different amounts of pressure. Your health care provider will tell you whether CPAP or BIPAP would be more helpful for you. CPAP stands for "continuous positive airway pressure." With CPAP, the amount of pressure stays the same while you breathe in (inhale) and out (exhale). BIPAP stands for "bi-level positive airway pressure." With BIPAP, the amount of pressure will be higher when you inhale and lower when you exhale. This allows you to take larger breaths. CPAP or BIPAP may be used in the hospital, or your health care provider may want you to use it at home. You may need to have a sleep study before your health care provider can order a machine for you to use at home. What are the advantages? CPAP or BIPAP can be helpful if you have: Sleep apnea. Chronic obstructive pulmonary disease (COPD). Heart failure. Medical conditions that cause muscle weakness, including muscular dystrophy or amyotrophic lateral sclerosis (ALS). Other problems that cause breathing to be shallow, weak, abnormal, or difficult. CPAP and BIPAP are most commonly used for obstructive sleep apnea (OSA) to keep the airways from collapsing when the muscles relax during sleep. What are the risks? Generally, this is a safe treatment. However, problems may occur, including: Irritated skin  or skin sores if the mask does not fit properly. Dry or stuffy nose or nosebleeds. Dry mouth. Feeling gassy or bloated. Sinus or lung infection if the equipment is not cleaned properly. When should CPAP or BIPAP be used? In most cases, the mask only needs to be worn during sleep. Generally, the mask needs to be worn throughout the night and during any daytime naps. People with certain medical conditions may also need to wear the mask at other times, such as when they are awake. Follow instructions from your health care provider about when to use the machine. What happens during CPAP or BIPAP?  Both CPAP and BIPAP are provided by a small machine with a flexible plastic tube that attaches to a plastic mask that you wear. Air is blown through the mask into your nose or mouth. The amount of pressure that is used to blow the air can be adjusted on the machine. Your health care provider will set the pressure setting and help you find the best mask for you. Tips for using the mask Because the mask needs to be snug, some people feel trapped or closed-in (claustrophobic) when first using the mask. If you feel this way, you may need to get used to the mask. One way to do this is to hold the mask loosely over your nose or mouth and then gradually apply the mask more snugly. You can also gradually increase the amount of time that you use the mask. Masks are available in various types and sizes. If your mask does not fit well, talk with your health care provider about getting a  different one. Some common types of masks include: Full face masks, which fit over the mouth and nose. Nasal masks, which fit over the nose. Nasal pillow or prong masks, which fit into the nostrils. If you are using a mask that fits over your nose and you tend to breathe through your mouth, a chin strap may be applied to help keep your mouth closed. Use a skin barrier to protect your skin as told by your health care provider. Some CPAP  and BIPAP machines have alarms that may sound if the mask comes off or develops a leak. If you have trouble with the mask, it is very important that you talk with your health care provider about finding a way to make the mask easier to tolerate. Do not stop using the mask. There could be a negative impact on your health if you stop using the mask. Tips for using the machine Place your CPAP or BIPAP machine on a secure table or stand near an electrical outlet. Know where the on/off switch is on the machine. Follow instructions from your health care provider about how to set the pressure on your machine and when you should use it. Do not eat or drink while the CPAP or BIPAP machine is on. Food or fluids could get pushed into your lungs by the pressure of the CPAP or BIPAP. For home use, CPAP and BIPAP machines can be rented or purchased through home health care companies. Many different brands of machines are available. Renting a machine before purchasing may help you find out which particular machine works well for you. Your health insurance company may also decide which machine you may get. Keep the CPAP or BIPAP machine and attachments clean. Ask your health care provider for specific instructions. Check the humidifier if you have a dry stuffy nose or nosebleeds. Make sure it is working correctly. Follow these instructions at home: Take over-the-counter and prescription medicines only as told by your health care provider. Ask if you can take sinus medicine if your sinuses are blocked. Do not use any products that contain nicotine or tobacco. These products include cigarettes, chewing tobacco, and vaping devices, such as e-cigarettes. If you need help quitting, ask your health care provider. Keep all follow-up visits. This is important. Contact a health care provider if: You have redness or pressure sores on your head, face, mouth, or nose from the mask or head gear. You have trouble using the CPAP  or BIPAP machine. You cannot tolerate wearing the CPAP or BIPAP mask. Someone tells you that you snore even when wearing your CPAP or BIPAP. Get help right away if: You have trouble breathing. You feel confused. Summary CPAP and BIPAP are methods that use air pressure to keep your airways open and to help you breathe well. If you have trouble with the mask, it is very important that you talk with your health care provider about finding a way to make the mask easier to tolerate. Do not stop using the mask. There could be a negative impact to your health if you stop using the mask. Follow instructions from your health care provider about when to use the machine. This information is not intended to replace advice given to you by your health care provider. Make sure you discuss any questions you have with your health care provider. Document Revised: 01/08/2021 Document Reviewed: 05/10/2020 Elsevier Patient Education  2023 [employer].

## 2022-11-02 NOTE — Progress Notes (Signed)
Reviewed and agree with assessment/plan.   Coralyn Helling, MD [name] Pulmonary/Critical Care 11/02/2022, 1:02 PM Pager:  [phone]

## 2022-11-17 ENCOUNTER — Other Ambulatory Visit (HOSPITAL_COMMUNITY): Payer: Self-pay | Admitting: Cardiology

## 2022-12-04 DIAGNOSIS — I10 Essential (primary) hypertension: Secondary | ICD-10-CM | POA: Diagnosis not present

## 2022-12-04 DIAGNOSIS — M549 Dorsalgia, unspecified: Secondary | ICD-10-CM | POA: Diagnosis not present

## 2022-12-04 DIAGNOSIS — R748 Abnormal levels of other serum enzymes: Secondary | ICD-10-CM | POA: Diagnosis not present

## 2022-12-04 DIAGNOSIS — I509 Heart failure, unspecified: Secondary | ICD-10-CM | POA: Diagnosis not present

## 2022-12-04 DIAGNOSIS — J309 Allergic rhinitis, unspecified: Secondary | ICD-10-CM | POA: Diagnosis not present

## 2022-12-04 DIAGNOSIS — E1121 Type 2 diabetes mellitus with diabetic nephropathy: Secondary | ICD-10-CM | POA: Diagnosis not present

## 2022-12-04 DIAGNOSIS — I428 Other cardiomyopathies: Secondary | ICD-10-CM | POA: Diagnosis not present

## 2022-12-04 DIAGNOSIS — E785 Hyperlipidemia, unspecified: Secondary | ICD-10-CM | POA: Diagnosis not present

## 2022-12-04 DIAGNOSIS — G8929 Other chronic pain: Secondary | ICD-10-CM | POA: Diagnosis not present

## 2022-12-04 DIAGNOSIS — D509 Iron deficiency anemia, unspecified: Secondary | ICD-10-CM | POA: Diagnosis not present

## 2022-12-09 ENCOUNTER — Encounter: Payer: Self-pay | Admitting: Hematology

## 2022-12-23 ENCOUNTER — Encounter (HOSPITAL_COMMUNITY): Payer: Self-pay | Admitting: Adult Health

## 2022-12-23 NOTE — Progress Notes (Signed)
  Advanced Heart Failure   Pre-operative Risk Assessment    Patient Name: [name]  DOB: 06-22-1970 MRN: [id number]   {Request for Surgical Clearance    Procedure:  Dental Extraction - Amount of Teeth to be Pulled:  TBD  Date of Surgery:  Clearance TBD                                 Surgeon:  Dr Linna Caprice DDS Surgeon's Group or Practice Name:  [facility] [place] Phone number:  [phone] Fax number:  [phone]   Type of Clearance Requested:   - Medical    Type of Anesthesia:  Local /IV sedation    Additional requests/questions:   None   Signed, Talin Feister  NP-C / Dr Shirlee Latch Advanced [facility] Failure  12/23/2022, 4:18 PM

## 2022-12-28 ENCOUNTER — Ambulatory Visit: Payer: 59 | Admitting: Primary Care

## 2023-01-01 ENCOUNTER — Encounter: Payer: Self-pay | Admitting: Cardiology

## 2023-01-01 ENCOUNTER — Ambulatory Visit: Payer: 59 | Attending: Cardiology | Admitting: Cardiology

## 2023-01-04 DIAGNOSIS — M5416 Radiculopathy, lumbar region: Secondary | ICD-10-CM | POA: Diagnosis not present

## 2023-01-12 DIAGNOSIS — H3582 Retinal ischemia: Secondary | ICD-10-CM | POA: Diagnosis not present

## 2023-01-12 DIAGNOSIS — H43823 Vitreomacular adhesion, bilateral: Secondary | ICD-10-CM | POA: Diagnosis not present

## 2023-01-12 DIAGNOSIS — E113513 Type 2 diabetes mellitus with proliferative diabetic retinopathy with macular edema, bilateral: Secondary | ICD-10-CM | POA: Diagnosis not present

## 2023-01-15 DIAGNOSIS — M5459 Other low back pain: Secondary | ICD-10-CM | POA: Diagnosis not present

## 2023-01-15 DIAGNOSIS — Z4542 Encounter for adjustment and management of neuropacemaker (brain) (peripheral nerve) (spinal cord): Secondary | ICD-10-CM | POA: Diagnosis not present

## 2023-02-04 ENCOUNTER — Other Ambulatory Visit: Payer: Self-pay | Admitting: Family Medicine

## 2023-02-04 DIAGNOSIS — Z1231 Encounter for screening mammogram for malignant neoplasm of breast: Secondary | ICD-10-CM

## 2023-02-10 DIAGNOSIS — R053 Chronic cough: Secondary | ICD-10-CM | POA: Diagnosis not present

## 2023-02-22 DIAGNOSIS — M5136 Other intervertebral disc degeneration, lumbar region: Secondary | ICD-10-CM | POA: Diagnosis not present

## 2023-02-22 DIAGNOSIS — M5416 Radiculopathy, lumbar region: Secondary | ICD-10-CM | POA: Diagnosis not present

## 2023-02-22 DIAGNOSIS — Z9689 Presence of other specified functional implants: Secondary | ICD-10-CM | POA: Diagnosis not present

## 2023-02-24 ENCOUNTER — Ambulatory Visit: Payer: 59 | Admitting: Cardiology

## 2023-02-24 ENCOUNTER — Telehealth: Payer: Self-pay

## 2023-03-03 DIAGNOSIS — E119 Type 2 diabetes mellitus without complications: Secondary | ICD-10-CM | POA: Diagnosis not present

## 2023-03-04 ENCOUNTER — Ambulatory Visit: Payer: 59

## 2023-03-19 ENCOUNTER — Ambulatory Visit: Payer: 59

## 2023-03-24 DIAGNOSIS — I10 Essential (primary) hypertension: Secondary | ICD-10-CM | POA: Diagnosis not present

## 2023-03-24 DIAGNOSIS — D509 Iron deficiency anemia, unspecified: Secondary | ICD-10-CM | POA: Diagnosis not present

## 2023-03-24 DIAGNOSIS — J309 Allergic rhinitis, unspecified: Secondary | ICD-10-CM | POA: Diagnosis not present

## 2023-03-24 DIAGNOSIS — E1121 Type 2 diabetes mellitus with diabetic nephropathy: Secondary | ICD-10-CM | POA: Diagnosis not present

## 2023-03-24 DIAGNOSIS — R748 Abnormal levels of other serum enzymes: Secondary | ICD-10-CM | POA: Diagnosis not present

## 2023-03-24 DIAGNOSIS — G8929 Other chronic pain: Secondary | ICD-10-CM | POA: Diagnosis not present

## 2023-03-24 DIAGNOSIS — E785 Hyperlipidemia, unspecified: Secondary | ICD-10-CM | POA: Diagnosis not present

## 2023-03-24 DIAGNOSIS — M549 Dorsalgia, unspecified: Secondary | ICD-10-CM | POA: Diagnosis not present

## 2023-03-24 DIAGNOSIS — I509 Heart failure, unspecified: Secondary | ICD-10-CM | POA: Diagnosis not present

## 2023-03-25 ENCOUNTER — Ambulatory Visit
Admission: RE | Admit: 2023-03-25 | Discharge: 2023-03-25 | Disposition: A | Discharge status: Home / Self Care | Payer: 59 | Source: Ambulatory Visit | Attending: Family Medicine | Admitting: Family Medicine

## 2023-03-25 DIAGNOSIS — Z1231 Encounter for screening mammogram for malignant neoplasm of breast: Secondary | ICD-10-CM | POA: Diagnosis not present

## 2023-03-26 ENCOUNTER — Encounter: Payer: Self-pay | Admitting: Family Medicine

## 2023-04-07 DIAGNOSIS — M5416 Radiculopathy, lumbar region: Secondary | ICD-10-CM | POA: Diagnosis not present

## 2023-04-09 ENCOUNTER — Telehealth (HOSPITAL_COMMUNITY): Payer: Self-pay
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
## 2023-04-09 NOTE — Telephone Encounter (Signed)
Called to confirm/remind patient of their appointment at the [facility] on 04/12/23.   Patient reminded to bring all medications and/or complete list.  Confirmed patient has transportation. Gave directions, instructed to utilize valet parking.  Confirmed appointment prior to ending call.

## 2023-04-10 ENCOUNTER — Other Ambulatory Visit (HOSPITAL_COMMUNITY): Payer: Self-pay | Admitting: Cardiology

## 2023-04-12 ENCOUNTER — Encounter (HOSPITAL_COMMUNITY): Payer: Self-pay

## 2023-04-12 ENCOUNTER — Ambulatory Visit (HOSPITAL_COMMUNITY)
Admission: RE | Admit: 2023-04-12 | Discharge: 2023-04-12 | Disposition: A | Discharge status: Home / Self Care | Payer: 59 | Source: Ambulatory Visit | Attending: Family Medicine

## 2023-04-12 VITALS — BP 108/74 | HR 83 | Wt 163.8 lb

## 2023-04-12 DIAGNOSIS — Z7985 Long-term (current) use of injectable non-insulin antidiabetic drugs: Secondary | ICD-10-CM | POA: Insufficient documentation

## 2023-04-12 DIAGNOSIS — G4733 Obstructive sleep apnea (adult) (pediatric): Secondary | ICD-10-CM | POA: Insufficient documentation

## 2023-04-12 DIAGNOSIS — Z6827 Body mass index (BMI) 27.0-27.9, adult: Secondary | ICD-10-CM | POA: Diagnosis not present

## 2023-04-12 DIAGNOSIS — I2510 Atherosclerotic heart disease of native coronary artery without angina pectoris: Secondary | ICD-10-CM | POA: Insufficient documentation

## 2023-04-12 DIAGNOSIS — E663 Overweight: Secondary | ICD-10-CM | POA: Insufficient documentation

## 2023-04-12 DIAGNOSIS — R7989 Other specified abnormal findings of blood chemistry: Secondary | ICD-10-CM | POA: Diagnosis not present

## 2023-04-12 DIAGNOSIS — I110 Hypertensive heart disease with heart failure: Secondary | ICD-10-CM | POA: Insufficient documentation

## 2023-04-12 DIAGNOSIS — I5022 Chronic systolic (congestive) heart failure: Secondary | ICD-10-CM | POA: Insufficient documentation

## 2023-04-12 DIAGNOSIS — Z7984 Long term (current) use of oral hypoglycemic drugs: Secondary | ICD-10-CM | POA: Insufficient documentation

## 2023-04-12 DIAGNOSIS — Z79899 Other long term (current) drug therapy: Secondary | ICD-10-CM | POA: Insufficient documentation

## 2023-04-12 DIAGNOSIS — I428 Other cardiomyopathies: Secondary | ICD-10-CM | POA: Insufficient documentation

## 2023-04-12 DIAGNOSIS — R42 Dizziness and giddiness: Secondary | ICD-10-CM | POA: Insufficient documentation

## 2023-04-12 LAB — BASIC METABOLIC PANEL
Anion gap: 11 (ref 5–15)
BUN: 22 mg/dL — ABNORMAL HIGH (ref 6–20)
CO2: 19 mmol/L — ABNORMAL LOW (ref 22–32)
Calcium: 8.4 mg/dL — ABNORMAL LOW (ref 8.9–10.3)
Chloride: 113 mmol/L — ABNORMAL HIGH (ref 98–111)
Creatinine, Ser: 0.97 mg/dL (ref 0.44–1.00)
GFR, Estimated: >60 mL/min (ref >60)
Glucose, Bld: 110 mg/dL — ABNORMAL HIGH (ref 70–99)
Potassium: 3.4 mmol/L — ABNORMAL LOW (ref 3.5–5.1)
Sodium: 143 mmol/L (ref 135–145)

## 2023-04-12 LAB — LIPID PANEL
Cholesterol: 99 mg/dL (ref 0–200)
HDL: 52 mg/dL (ref >40)
LDL Cholesterol: 39 mg/dL (ref 0–99)
Total CHOL/HDL Ratio: 1.9 {ratio}
Triglycerides: 41 mg/dL (ref <150)
VLDL: 8 mg/dL (ref 0–40)

## 2023-04-12 MED ORDER — LOSARTAN POTASSIUM 25 MG PO TABS: 12.5000 mg | ORAL_TABLET | Freq: Every day | ORAL | 3 refills | Status: DC

## 2023-04-12 NOTE — Patient Instructions (Addendum)
Thank you for coming in today  If you had labs drawn today, any labs that are abnormal the clinic will call you No news is good news  You have been given a prescription for compression hose. And a list of places who supply them. Please wear your compression hose daily, place them on as soon as you get up in the morning and remove before you go to bed at night.   Medications: Stop entresto START Losartan 12.5 mg 1/2 tablet daily   Follow up appointments:  Your physician recommends that you schedule a follow-up appointment in:  6 months With Dr. Shirlee Latch with echocardiogram You will receive a reminder letter in the mail a few months in advance. If you don't receive a letter, please call our office to schedule the follow-up appointment.  Your physician has requested that you have an echocardiogram. Echocardiography is a painless test that uses sound waves to create images of your heart. It provides your doctor with information about the size and shape of your heart and how well your heart's chambers and valves are working. This procedure takes approximately one hour. There are no restrictions for this procedure.      Do the following things EVERYDAY: Weigh yourself in the morning before breakfast. Write it down and keep it in a log. Take your medicines as prescribed Eat low salt foods--Limit salt (sodium) to 2000 mg per day.  Stay as active as you can everyday Limit all fluids for the day to less than 2 liters   At the [facility], you and your health needs are our priority. As part of our continuing mission to provide you with exceptional heart care, we have created designated Provider Care Teams. These Care Teams include your primary Cardiologist (physician) and Advanced Practice Providers (APPs- Physician Assistants and Nurse Practitioners) who all work together to provide you with the care you need, when you need it.   You may see any of the following providers on  your designated Care Team at your next follow up: Dr Arvilla Meres Dr Marca Ancona Dr. Marcos Eke, NP Robbie Lis, [place] [facility], [place] Brynda Peon, NP Karle Plumber, PharmD   Please be sure to bring in all your medications bottles to every appointment.    Thank you for choosing [facility]  If you have any questions or concerns before your next appointment please send [place] a message through [place] or call our office at [phone].    TO LEAVE A MESSAGE FOR THE NURSE SELECT OPTION 2, PLEASE LEAVE A MESSAGE INCLUDING: YOUR NAME DATE OF BIRTH CALL BACK NUMBER REASON FOR CALL**this is important as we prioritize the call backs  YOU WILL RECEIVE A CALL BACK THE SAME DAY AS LONG AS YOU CALL BEFORE 4:00 PM

## 2023-04-13 ENCOUNTER — Telehealth (HOSPITAL_COMMUNITY): Payer: Self-pay

## 2023-04-13 ENCOUNTER — Ambulatory Visit: Payer: 59 | Admitting: Cardiology

## 2023-04-13 DIAGNOSIS — I5022 Chronic systolic (congestive) heart failure: Secondary | ICD-10-CM

## 2023-04-13 MED ORDER — SPIRONOLACTONE 25 MG PO TABS: 25.0000 mg | ORAL_TABLET | Freq: Every day | ORAL | 6 refills | Status: DC

## 2023-04-13 NOTE — Telephone Encounter (Signed)
Patient's [name] medication has been increased to 25 mg daily and her chart updated. In addition, patient's labs has been placed and her appointment scheduled. Pt aware, agreeable, and verbalized understanding.

## 2023-04-13 NOTE — Telephone Encounter (Signed)
-----   Message from Jacklynn Ganong sent at 04/13/2023  8:04 AM EDT ----- K is  a little low. Increase spiro to 25 mg daily.  Repeat BMET in 1 week

## 2023-04-16 ENCOUNTER — Encounter: Payer: Self-pay | Admitting: Hematology

## 2023-04-21 ENCOUNTER — Other Ambulatory Visit (HOSPITAL_COMMUNITY): Payer: 59

## 2023-04-26 ENCOUNTER — Telehealth (HOSPITAL_COMMUNITY): Payer: Self-pay | Admitting: Cardiology

## 2023-04-26 DIAGNOSIS — I5022 Chronic systolic (congestive) heart failure: Secondary | ICD-10-CM

## 2023-04-26 MED ORDER — ENTRESTO 24-26 MG PO TABS: 1.0000 | ORAL_TABLET | Freq: Two times a day (BID) | ORAL | 11 refills | Status: DC
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
## 2023-04-26 NOTE — Telephone Encounter (Signed)
Pt aware.

## 2023-05-04 DIAGNOSIS — Z9689 Presence of other specified functional implants: Secondary | ICD-10-CM | POA: Diagnosis not present

## 2023-05-04 DIAGNOSIS — M51369 Other intervertebral disc degeneration, lumbar region without mention of lumbar back pain or lower extremity pain: Secondary | ICD-10-CM | POA: Diagnosis not present

## 2023-05-04 DIAGNOSIS — M25512 Pain in left shoulder: Secondary | ICD-10-CM | POA: Diagnosis not present

## 2023-05-04 NOTE — Telephone Encounter (Signed)
Pt reports since change losartan back to entresto BLE has not improved.  Another episode of severe SOB  Reports she drinks 11-12 16oz bottles of water daily Unable to find DME company to fill RX for compressions   Torsemide 20 BID' Spiro 25 daily Entresto 24/26 Jardiance 10 daily   No-show'd 10 day bmet 11/6 No fu scheduled at this time   Please advise

## 2023-05-04 NOTE — Telephone Encounter (Signed)
Returned call No answer unable to leave message VM full

## 2023-05-05 MED ORDER — TORSEMIDE 20 MG PO TABS: ORAL_TABLET | ORAL | 3 refills | Status: DC

## 2023-05-05 MED ORDER — POTASSIUM CHLORIDE ER 10 MEQ PO TBCR: EXTENDED_RELEASE_TABLET | ORAL | 3 refills | Status: DC

## 2023-05-05 NOTE — Telephone Encounter (Signed)
Attempted to return call No answer unable to leave message VM full

## 2023-05-05 NOTE — Addendum Note (Signed)
Addended by: Elora Wolter, Milagros Reap on: 05/05/2023 10:30 AM   Modules accepted: Orders

## 2023-05-05 NOTE — Telephone Encounter (Signed)
Pt aware.
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
## 2023-05-12 ENCOUNTER — Other Ambulatory Visit (HOSPITAL_COMMUNITY): Payer: 59

## 2023-05-20 ENCOUNTER — Other Ambulatory Visit (HOSPITAL_COMMUNITY): Payer: 59

## 2023-05-24 ENCOUNTER — Encounter (HOSPITAL_COMMUNITY): Payer: Self-pay

## 2023-05-24 ENCOUNTER — Ambulatory Visit (HOSPITAL_COMMUNITY)
Admission: EM | Admit: 2023-05-24 | Discharge: 2023-05-24 | Disposition: A | Discharge status: Home / Self Care | Payer: 59

## 2023-05-24 ENCOUNTER — Ambulatory Visit (INDEPENDENT_AMBULATORY_CARE_PROVIDER_SITE_OTHER): Payer: 59

## 2023-05-24 DIAGNOSIS — M25551 Pain in right hip: Secondary | ICD-10-CM | POA: Diagnosis not present

## 2023-05-24 DIAGNOSIS — M25512 Pain in left shoulder: Secondary | ICD-10-CM | POA: Diagnosis not present

## 2023-05-24 DIAGNOSIS — Z043 Encounter for examination and observation following other accident: Secondary | ICD-10-CM | POA: Diagnosis not present

## 2023-05-24 MED ORDER — LIDOCAINE 4 % EX PTCH: 1.0000 | MEDICATED_PATCH | CUTANEOUS | 0 refills | Status: DC

## 2023-05-24 NOTE — ED Triage Notes (Signed)
Patient here today with c/o right hip pain since yesterday. Patient states that she was stood up and felt a pop in her right hip and fell onto her left shoulder.

## 2023-05-24 NOTE — Discharge Instructions (Addendum)
I do not see any obvious fractures or deformity on your x-rays.  We have provided you with a shoulder sling in clinic.  You can use the lidocaine patches as needed and take your oxycodone as needed as well.  Please follow-up with your orthopedic provider if your pain persist.  The official x-ray reading will be back sometime later today and our staff will contact you tomorrow if any follow-up is needed.

## 2023-05-24 NOTE — ED Provider Notes (Signed)
[facility]    CSN: [id number] Arrival date & time: 05/24/23  1826      History   Chief Complaint Chief Complaint  Patient presents with   Hip Pain   Fall    HPI [name] is a 52 y.o. female.   Patient presents to clinic for right hip pain and left shoulder pain.  She had 2 separate falls yesterday.  She went to stand up and felt a pop in her right hip, this caused her to fall forward.  She has been ambulatory with pain and discomfort since.  She was texting on her phone and tripped over a children's toy little while later and landed on her left shoulder.  Reports shoulder range of motion is limited due to pain.  She does have a history of chronic pain and took an oxycodone yesterday with some mild relief.  She has not taken any medication today.  She takes a daily muscle relaxer.  Patient with significant medical history of opioid dependence, thoracic back pain, low back pain, lumbar radiculopathy, chronic pain, carpal tunnel syndrome, polyneuropathy, DM, OSA, PVD, CHF, HLD and HTN.    The history is provided by the patient and medical records.  Hip Pain  Fall    Past Medical History:  Diagnosis Date   Anemia    hx of iron transfusions - last one 10/2013    Anxiety    Arthritis    neck and shoulders    Asthma    Back pain, chronic    Bipolar disorder (HCC)    Carpal tunnel syndrome on both sides    Cataract    Depression    Diabetes mellitus    Fibromyalgia    GERD (gastroesophageal reflux disease)    History of gastric bypass Jan 2013   Hyperlipidemia    Hypertension    Insomnia    Migraines    Nervous breakdown sept 2013   Neuropathy    Nonischemic cardiomyopathy (HCC)    OSA (obstructive sleep apnea) 02/21/2015   Peripheral vascular disease (HCC)    Shortness of breath    with exertion    Sleep apnea    borderline- no cpap yet   Systolic CHF, acute on chronic (HCC)    CARDIOLOGIST-- DR Aris Lot NELSON  ([place])   Wound of left  breast     Patient Active Problem List   Diagnosis Date Noted   Abnormal weight gain 11/29/2020   Absolute anemia 11/29/2020   Accumulation of fluid in tissues 11/29/2020   Acid reflux 11/29/2020   Acute infection of nasal sinus 11/29/2020   Adverse effect of angiotensin-converting enzyme inhibitor 11/29/2020   AF (amaurosis fugax) 11/29/2020   Allergic rhinitis 11/29/2020   Ankle ulcer (HCC) 11/29/2020   Asthma, mild intermittent 11/29/2020   Avitaminosis D 11/29/2020   Paresthesia 11/29/2020   CCF (congestive cardiac failure) (HCC) 11/29/2020   Cerumen impaction 11/29/2020   Clinical depression 11/29/2020   Chronic constipation 11/29/2020   Diabetes mellitus type 2, uncontrolled 11/29/2020   Discharge from the vagina 11/29/2020   Dryness of vagina 11/29/2020   Edema leg 11/29/2020   Encounter for general adult medical examination without abnormal findings 11/29/2020   Folliculitis 11/29/2020   Fungal infection of toenail 11/29/2020   HLD (hyperlipidemia) 11/29/2020   Infection of the upper respiratory tract 11/29/2020   Abdominal wall abscess 11/29/2020   Ache in joint 11/29/2020   Need for vaccination 11/29/2020   Nocturnal dyspnea 11/29/2020   Panaritium  of toe 11/29/2020   Polypharmacy 11/29/2020   Received influenza vaccination at hospital 11/29/2020   Trichomonal fluor vaginalis 11/29/2020   Pain in right hand 11/13/2020   Elevated blood-pressure reading, without diagnosis of hypertension 01/31/2020   Failed back syndrome of lumbar spine 01/31/2020   Spinal cord stimulator status 08/16/2019   Opioid dependence (HCC) 02/23/2019   Essential hypertension 12/18/2018   Syncope 08/01/2018   AKI (acute kidney injury) (HCC) 08/01/2018   Menorrhagia with irregular cycle 06/02/2018   Palpitations 08/18/2017   Hypokalemia 08/18/2017   Elevated LFTs 12/24/2015   OSA (obstructive sleep apnea) 02/21/2015   Excessive daytime sleepiness 11/29/2014   Snoring 11/29/2014    Goiter 09/06/2014   Fatigue 09/06/2014   Chronic systolic CHF (congestive heart failure) (HCC) 05/31/2014   Open breast wound 12/26/2013   Breast injury 12/05/2013   Left breast abscess 11/26/2013   Chronic systolic heart failure (HCC) 11/03/2013   History of iron deficiency anemia 10/12/2013   Lumbosacral radiculitis 08/22/2013   Carpal tunnel syndrome 06/26/2013   Back pain, chronic 06/26/2013   Idiopathic peripheral neuropathy 06/26/2013   History of gastric bypass 03/13/2012   Anxiety disorder 03/13/2012   Nervous breakdown 03/13/2012   Hematochezia 03/13/2012   Iron deficiency anemia due to chronic blood loss 03/13/2012   DM type 2 (diabetes mellitus, type 2) (HCC) 03/13/2012   Generalized weakness 03/13/2012   Generalized abdominal pain 03/13/2012   Hyperactive bowel sounds 03/13/2012   Dehydration 03/13/2012   Insomnia 03/13/2012   Diabetic neuropathy (HCC) 03/13/2012   Migraine headache 03/13/2012   Major depressive disorder, recurrent episode, moderate (HCC) 02/18/2012    Class: Acute   Compulsive behavior disorder (HCC) 02/18/2012    Class: Chronic   Nausea and vomiting 07/21/2011   Personal history of surgery to heart and great vessels, presenting hazards to health 06/30/2011   Morbid obesity (HCC) 06/25/2011   IDDM (insulin dependent diabetes mellitus) 06/25/2011   Degenerative disc disease 06/25/2011    Past Surgical History:  Procedure Laterality Date   BACK SURGERY  04/1991   BREAST CYST ASPIRATION Left    BREAST SURGERY Left    I&D for abcsess    CARDIAC CATHETERIZATION  11-14-2013  DR DALTON MCLEAN   NORMAL LV FILLING PRESSURE W/ MILD ELEVATED RV FILLING, POSSIBLY SUFFESTING RV FAILURE OUT OF PROPORTION TO LV FAILURE. EF ABOUT 35% WITH DIFFUSE HYPOKINESIS. NO ANGIOGRAPHIC CAD, NONISCHEMIC CARDIOMYOPATHY   CARPAL TUNNEL RELEASE Bilateral    CATARACT EXTRACTION Right    CESAREAN SECTION  05/1997   CHOLECYSTECTOMY  1998   DILITATION &  CURRETTAGE/HYSTROSCOPY WITH NOVASURE ABLATION N/A 02/26/2014   Procedure: DILATATION & CURETTAGE/HYSTEROSCOPY WITH NOVASURE ABLATION;  Surgeon: Meriel Pica, MD;  Location: [facility];  Service: Gynecology;  Laterality: N/A;   ESOPHAGOGASTRODUODENOSCOPY  07/27/2011   Procedure: ESOPHAGOGASTRODUODENOSCOPY (EGD);  Surgeon: Freddy Jaksch, MD;  Location: [facility];  Service: Endoscopy;  Laterality: N/A;   GASTRIC ROUX-EN-Y  06/30/2011   Procedure: LAPAROSCOPIC ROUX-EN-Y GASTRIC;  Surgeon: Mariella Saa, MD;  Location: [facility];  Service: General;  Laterality: N/A;      INCISION AND DRAINAGE OF WOUND Left 12/14/2013   Procedure: IRRIGATION AND DEBRIDEMENT OF LEFT BREAST WOUND WITH PLACEMENT OF A-CELL, WITH CLOSURE ;  Surgeon: Wayland Denis, DO;  Location: [facility];  Service: Plastics;  Laterality: Left;   IRRIGATION AND DEBRIDEMENT ABSCESS Left 11/26/2013   Procedure: IRRIGATION AND DEBRIDEMENT ABSCESS;  Surgeon: Velora Heckler, MD;  Location: [facility];  Service: General;  Laterality: Left;   LEFT AND RIGHT HEART CATHETERIZATION WITH CORONARY ANGIOGRAM N/A 11/14/2013   Procedure: LEFT AND RIGHT HEART CATHETERIZATION WITH CORONARY ANGIOGRAM;  Surgeon: Laurey Morale, MD;  Location: [facility];  Service: Cardiovascular;  Laterality: N/A;   SPINAL CORD STIMULATOR INSERTION N/A 03/09/2014   Procedure: LUMBAR SPINAL CORD STIMULATOR INSERTION;  Surgeon: Gwynne Edinger, MD;  Location: [facility];  Service: Neurosurgery;  Laterality: N/A;   TUBAL LIGATION  06/1997    OB History     Gravida  1   Para  1   Term      Preterm  1   AB      Living  1      SAB      IAB      Ectopic      Multiple      Live Births  1            Home Medications    Prior to Admission medications   Medication Sig Start Date End Date Taking? Authorizing Provider  lidocaine 4 % Place 1 patch onto the skin daily. 05/24/23  Yes Rinaldo Ratel, [place], FNP  montelukast (SINGULAIR) 10 MG tablet Take 10  mg by mouth daily. 04/29/23  Yes [provider]  MOUNJARO 7.5 MG/0.5ML Pen Inject 7.5 mg into the skin once a week. 05/17/23  Yes [provider]  ACCU-CHEK GUIDE test strip 1 each by Other route 3 (three) times daily. 04/17/19   [provider]  albuterol (VENTOLIN HFA) 108 (90 Base) MCG/ACT inhaler Inhale 2 puffs into the lungs every 4 (four) hours as needed for wheezing or shortness of breath.    [provider]  Biotin 5000 MCG TABS Take 5,000 mcg by mouth daily.    [provider]  Cholecalciferol (VITAMIN D3) 5000 units TABS Take 5,000 Units by mouth daily.     [provider]  cloNIDine (CATAPRES) 0.1 MG tablet Take 0.1 mg by mouth 2 (two) times daily. 11/14/20   [provider]  cyclobenzaprine (FLEXERIL) 10 MG tablet Take 10 mg by mouth 3 (three) times daily as needed for muscle spasms.  11/23/13   [provider]  diclofenac sodium (VOLTAREN) 1 % GEL Apply 2 g topically 2 (two) times daily.    [provider]  DULoxetine (CYMBALTA) 30 MG capsule Take 30 mg by mouth daily.    [provider]  DULoxetine (CYMBALTA) 60 MG capsule Take 60 mg by mouth daily.    [provider]  famotidine (PEPCID) 20 MG tablet Take 20 mg by mouth daily. 07/17/19   [provider]  fluticasone (FLONASE) 50 MCG/ACT nasal spray Place 1 spray into both nostrils daily.  03/07/13   [provider]  ibuprofen (ADVIL,MOTRIN) 800 MG tablet Take 800 mg by mouth every 8 (eight) hours as needed (pain).    [provider]  JARDIANCE 10 MG TABS tablet Take 10 mg by mouth daily. 07/17/19   [provider]  lubiprostone (AMITIZA) 24 MCG capsule Take 24 mcg by mouth 2 (two) times daily. 11/09/20   [provider]  metoprolol succinate (TOPROL-XL) 50 MG 24 hr tablet TAKE 1 TABLET BY MOUTH EVERY DAY 09/25/19   Laurey Morale, MD  nystatin ointment (MYCOSTATIN) nystatin 100,000 unit/gram topical  ointment  APPLY TO AFFECTED AREA TWICE A DAY TO RASH    [provider]  ondansetron (ZOFRAN) 4 MG tablet TAKE 1 TABLET BY MOUTH EVERY 8  HOURS AS NEEDED FOR NAUSEA AND VOMITING 12/14/18   Laurey Morale, MD  oxyCODONE-acetaminophen (PERCOCET) 10-325 MG per tablet Take 0.5-1 tablets by mouth every 4 (four) hours as needed for pain.    [provider]  potassium chloride (KLOR-CON) 10 MEQ tablet Take 6 tablets (60 mEq total) by mouth 2 (two) times daily for 3 days, THEN 6 tablets (60 mEq total) daily. 05/05/23 06/07/23  Jacklynn Ganong, FNP  pregabalin (LYRICA) 150 MG capsule Take 150 mg by mouth 3 (three) times daily.    [provider]  promethazine (PHENERGAN) 25 MG tablet promethazine 25 mg tablet  TAKE 1/2 TO 1 TABLET BY MOUTH EVERY 6 HOURS AS NEEDED FOR NAUSEA    [provider]  rosuvastatin (CRESTOR) 10 MG tablet Take 1 tablet (10 mg total) by mouth daily. 10/23/20   Laurey Morale, MD  sacubitril-valsartan (ENTRESTO) 24-26 MG Take 1 tablet by mouth 2 (two) times daily. 04/26/23   [place], Anderson Malta, FNP  solifenacin (VESICARE) 5 MG tablet solifenacin 5 mg tablet  TAKE 1 (ONE) TABLET DAILY FOR BLADDER CONTROL    [provider]  spironolactone (ALDACTONE) 25 MG tablet Take 1 tablet (25 mg total) by mouth daily. 04/13/23   [place], Anderson Malta, FNP  torsemide (DEMADEX) 20 MG tablet Take 2 tablets (40 mg total) by mouth 2 (two) times daily for 3 days, THEN 1 tablet (20 mg total) 2 (two) times daily. 05/05/23 06/07/23  Jacklynn Ganong, FNP    Family History Family History  Problem Relation Age of Onset   Hypertension Mother    Heart disease Sister    Hypertension Brother    Heart disease Brother    Diabetes Maternal Aunt    Diabetes Paternal Aunt    Diabetes Paternal Grandmother    Diabetes Paternal Grandfather    Hypertension Paternal Grandfather    Heart attack Neg Hx    Stroke Neg Hx    Colon cancer Neg Hx    Colon polyps Neg Hx     Esophageal cancer Neg Hx    Rectal cancer Neg Hx    Stomach cancer Neg Hx    Breast cancer Neg Hx     Social History Social History   Tobacco Use   Smoking status: Never   Smokeless tobacco: Never  Substance Use Topics   Alcohol use: No   Drug use: No     Allergies   Imitrex [sumatriptan], Lamictal [lamotrigine], and Metformin and related   Review of Systems Review of Systems  Per HPI   Physical Exam Triage Vital Signs ED Triage Vitals  Encounter Vitals Group     BP --      Systolic BP Percentile --      Diastolic BP Percentile --      Pulse --      Resp --      Temp --      Temp src --      SpO2 --      Weight 05/24/23 1946 158 lb (71.7 kg)     Height 05/24/23 1946 5\' 5"  (1.651 m)     Head Circumference --      Peak Flow --      Pain Score 05/24/23 1945 9     Pain Loc --      Pain Education --      Exclude from Growth Chart --    No data found.  Updated Vital Signs BP 132/84 (BP Location: Right  Arm)   Pulse 79   Temp 99.5 F (37.5 C) (Oral)   Resp 16   Ht 5\' 5"  (1.651 m)   Wt 158 lb (71.7 kg)   LMP  (LMP Unknown)   SpO2 97%   BMI 26.29 kg/m   Visual Acuity Right Eye Distance:   Left Eye Distance:   Bilateral Distance:    Right Eye Near:   Left Eye Near:    Bilateral Near:     Physical Exam Vitals and nursing note reviewed.  Constitutional:      Appearance: Normal appearance.  HENT:     Head: Normocephalic and atraumatic.     Right Ear: External ear normal.     Left Ear: External ear normal.     Nose: Nose normal.     Mouth/Throat:     Mouth: Mucous membranes are moist.  Eyes:     Conjunctiva/sclera: Conjunctivae normal.  Cardiovascular:     Rate and Rhythm: Normal rate.  Pulmonary:     Effort: Pulmonary effort is normal. No respiratory distress.  Musculoskeletal:        General: Tenderness present. No swelling or deformity.     Left shoulder: Tenderness present. Decreased range of motion.       Arms:     Comments:  Diffuse left anterior shoulder pain with palpation and with range of motion.  Range of motion restricted to 45 degrees due to pain.  No elbow pain, swelling or changes in range of motion.  Radial pulses 2+.  Ambulatory in clinic.  Strength 5 out of 5 in bilateral lower extremities.  Skin:    General: Skin is warm and dry.  Neurological:     General: No focal deficit present.     Mental Status: She is alert.  Psychiatric:        Mood and Affect: Mood normal.        Behavior: Behavior is cooperative.      UC Treatments / Results  Labs (all labs ordered are listed, but only abnormal results are displayed) Labs Reviewed - No data to display  EKG   Radiology No results found.  Procedures Procedures (including critical care time)  Medications Ordered in UC Medications - No data to display  Initial Impression / Assessment and Plan / UC Course  I have reviewed the triage vital signs and the nursing notes.  Pertinent labs & imaging results that were available during my care of the patient were reviewed by me and considered in my medical decision making (see chart for details).  Vitals and triage reviewed, patient is hemodynamically stable.  Right hip with full range of motion, some discomfort with weightbearing.  Strength 5 out of 5 in lower extremities.  Left shoulder with limitations to range of motion.  No obvious deformity or crepitus.  Imaging awaiting official radiology overread, no obvious fractures.  Suspect arthritis.  Trial lidocaine patches and current pain management routine.  Orthopedic follow-up as needed.  Shoulder sling for comfort.  Plan of care, follow-up care return precautions given, no questions at this time.     Final Clinical Impressions(s) / UC Diagnoses   Final diagnoses:  Right hip pain  Acute pain of left shoulder     Discharge Instructions      I do not see any obvious fractures or deformity on your x-rays.  We have provided you with a shoulder  sling in clinic.  You can use the lidocaine patches as needed and take your oxycodone  as needed as well.  Please follow-up with your orthopedic provider if your pain persist.  The official x-ray reading will be back sometime later today and our staff will contact you tomorrow if any follow-up is needed.     ED Prescriptions     Medication Sig Dispense Auth. Provider   lidocaine 4 % Place 1 patch onto the skin daily. 15 patch Savien Mamula, [place], [place]      PDMP not reviewed this encounter.   Jalie Eiland, [place] N, [place] 05/24/23 2028

## 2023-05-25 ENCOUNTER — Other Ambulatory Visit (HOSPITAL_COMMUNITY): Payer: 59
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
## 2023-06-17 ENCOUNTER — Ambulatory Visit (HOSPITAL_COMMUNITY)
Admission: RE | Admit: 2023-06-17 | Discharge: 2023-06-17 | Disposition: A | Discharge status: Home / Self Care | Payer: 59 | Source: Ambulatory Visit | Attending: Cardiology | Admitting: Cardiology

## 2023-06-17 VITALS — BP 110/76 | HR 83 | Wt 169.0 lb

## 2023-06-17 DIAGNOSIS — Z7984 Long term (current) use of oral hypoglycemic drugs: Secondary | ICD-10-CM | POA: Diagnosis not present

## 2023-06-17 DIAGNOSIS — G4733 Obstructive sleep apnea (adult) (pediatric): Secondary | ICD-10-CM | POA: Insufficient documentation

## 2023-06-17 DIAGNOSIS — R7401 Elevation of levels of liver transaminase levels: Secondary | ICD-10-CM | POA: Insufficient documentation

## 2023-06-17 DIAGNOSIS — I2510 Atherosclerotic heart disease of native coronary artery without angina pectoris: Secondary | ICD-10-CM | POA: Insufficient documentation

## 2023-06-17 DIAGNOSIS — Z7985 Long-term (current) use of injectable non-insulin antidiabetic drugs: Secondary | ICD-10-CM | POA: Diagnosis not present

## 2023-06-17 DIAGNOSIS — I428 Other cardiomyopathies: Secondary | ICD-10-CM | POA: Diagnosis not present

## 2023-06-17 DIAGNOSIS — Z6827 Body mass index (BMI) 27.0-27.9, adult: Secondary | ICD-10-CM | POA: Diagnosis not present

## 2023-06-17 DIAGNOSIS — Z79899 Other long term (current) drug therapy: Secondary | ICD-10-CM | POA: Insufficient documentation

## 2023-06-17 DIAGNOSIS — E663 Overweight: Secondary | ICD-10-CM | POA: Diagnosis not present

## 2023-06-17 DIAGNOSIS — I5022 Chronic systolic (congestive) heart failure: Secondary | ICD-10-CM | POA: Diagnosis not present

## 2023-06-17 LAB — BASIC METABOLIC PANEL
Anion gap: 8 (ref 5–15)
BUN: 19 mg/dL (ref 6–20)
CO2: 21 mmol/L — ABNORMAL LOW (ref 22–32)
Calcium: 8.9 mg/dL (ref 8.9–10.3)
Chloride: 113 mmol/L — ABNORMAL HIGH (ref 98–111)
Creatinine, Ser: 1.02 mg/dL — ABNORMAL HIGH (ref 0.44–1.00)
GFR, Estimated: >60 mL/min (ref >60)
Glucose, Bld: 181 mg/dL — ABNORMAL HIGH (ref 70–99)
Potassium: 3.5 mmol/L (ref 3.5–5.1)
Sodium: 142 mmol/L (ref 135–145)

## 2023-06-17 LAB — BRAIN NATRIURETIC PEPTIDE: B Natriuretic Peptide: 100.4 pg/mL — ABNORMAL HIGH (ref 0.0–100.0)

## 2023-06-17 NOTE — Patient Instructions (Signed)
 It was a pleasure seeing you today!  MEDICATIONS: -No medication changes today -Call if you have questions about your medications.  LABS: -We will call you if your labs need attention.  NEXT APPOINTMENT: Return to clinic in April 2025 with Dr. Rolan with an echo. Please call the clinic in February to make your appointment [phone](  In general, to take care of your heart failure: -Limit your fluid intake to 2 Liters (half-gallon) per day.   -Limit your salt intake to ideally 2-3 grams (2000-3000 mg) per day. -Weigh yourself daily and record, and bring that weight diary to your next appointment.  (Weight gain of 2-3 pounds in 1 day typically means fluid weight.) -The medications for your heart are to help your heart and help you live longer.   -Please contact us  before stopping any of your heart medications.  Call the clinic at [phone] with questions or to reschedule future appointments.

## 2023-07-12 DIAGNOSIS — J40 Bronchitis, not specified as acute or chronic: Secondary | ICD-10-CM | POA: Diagnosis not present

## 2023-07-12 DIAGNOSIS — R6889 Other general symptoms and signs: Secondary | ICD-10-CM | POA: Diagnosis not present

## 2023-07-12 DIAGNOSIS — I10 Essential (primary) hypertension: Secondary | ICD-10-CM | POA: Diagnosis not present

## 2023-07-22 DIAGNOSIS — I509 Heart failure, unspecified: Secondary | ICD-10-CM | POA: Diagnosis not present

## 2023-07-22 DIAGNOSIS — E785 Hyperlipidemia, unspecified: Secondary | ICD-10-CM | POA: Diagnosis not present

## 2023-07-22 DIAGNOSIS — M549 Dorsalgia, unspecified: Secondary | ICD-10-CM | POA: Diagnosis not present

## 2023-07-22 DIAGNOSIS — R748 Abnormal levels of other serum enzymes: Secondary | ICD-10-CM | POA: Diagnosis not present

## 2023-07-22 DIAGNOSIS — D509 Iron deficiency anemia, unspecified: Secondary | ICD-10-CM | POA: Diagnosis not present

## 2023-07-22 DIAGNOSIS — G8929 Other chronic pain: Secondary | ICD-10-CM | POA: Diagnosis not present

## 2023-07-22 DIAGNOSIS — Z1211 Encounter for screening for malignant neoplasm of colon: Secondary | ICD-10-CM | POA: Diagnosis not present

## 2023-07-22 DIAGNOSIS — R202 Paresthesia of skin: Secondary | ICD-10-CM | POA: Diagnosis not present

## 2023-07-22 DIAGNOSIS — I10 Essential (primary) hypertension: Secondary | ICD-10-CM | POA: Diagnosis not present

## 2023-07-22 DIAGNOSIS — J309 Allergic rhinitis, unspecified: Secondary | ICD-10-CM | POA: Diagnosis not present

## 2023-07-22 DIAGNOSIS — E1121 Type 2 diabetes mellitus with diabetic nephropathy: Secondary | ICD-10-CM | POA: Diagnosis not present

## 2023-07-23 ENCOUNTER — Encounter: Payer: Self-pay | Admitting: Family Medicine

## 2023-08-10 DIAGNOSIS — M5416 Radiculopathy, lumbar region: Secondary | ICD-10-CM | POA: Diagnosis not present

## 2023-08-11 ENCOUNTER — Other Ambulatory Visit (HOSPITAL_COMMUNITY): Payer: Self-pay | Admitting: Family Medicine

## 2023-08-25 DIAGNOSIS — M5416 Radiculopathy, lumbar region: Secondary | ICD-10-CM | POA: Diagnosis not present

## 2023-10-06 ENCOUNTER — Encounter: Payer: Self-pay | Admitting: Gastroenterology

## 2023-11-09 DIAGNOSIS — M5416 Radiculopathy, lumbar region: Secondary | ICD-10-CM | POA: Diagnosis not present

## 2023-11-16 DIAGNOSIS — I10 Essential (primary) hypertension: Secondary | ICD-10-CM | POA: Diagnosis not present

## 2023-11-16 DIAGNOSIS — N1831 Chronic kidney disease, stage 3a: Secondary | ICD-10-CM | POA: Diagnosis not present

## 2023-11-16 DIAGNOSIS — G8929 Other chronic pain: Secondary | ICD-10-CM | POA: Diagnosis not present

## 2023-11-16 DIAGNOSIS — K5903 Drug induced constipation: Secondary | ICD-10-CM | POA: Diagnosis not present

## 2023-11-16 DIAGNOSIS — M549 Dorsalgia, unspecified: Secondary | ICD-10-CM | POA: Diagnosis not present

## 2023-11-16 DIAGNOSIS — E785 Hyperlipidemia, unspecified: Secondary | ICD-10-CM | POA: Diagnosis not present

## 2023-11-16 DIAGNOSIS — R748 Abnormal levels of other serum enzymes: Secondary | ICD-10-CM | POA: Diagnosis not present

## 2023-11-16 DIAGNOSIS — D509 Iron deficiency anemia, unspecified: Secondary | ICD-10-CM | POA: Diagnosis not present

## 2023-11-16 DIAGNOSIS — E1121 Type 2 diabetes mellitus with diabetic nephropathy: Secondary | ICD-10-CM | POA: Diagnosis not present

## 2023-11-16 DIAGNOSIS — I509 Heart failure, unspecified: Secondary | ICD-10-CM | POA: Diagnosis not present

## 2023-11-16 DIAGNOSIS — J309 Allergic rhinitis, unspecified: Secondary | ICD-10-CM | POA: Diagnosis not present

## 2023-11-23 ENCOUNTER — Encounter: Payer: Self-pay | Admitting: Family Medicine

## 2023-11-26 ENCOUNTER — Other Ambulatory Visit (HOSPITAL_COMMUNITY): Payer: Self-pay | Admitting: Cardiology

## 2023-11-28 ENCOUNTER — Other Ambulatory Visit (HOSPITAL_COMMUNITY): Payer: Self-pay | Admitting: Family Medicine

## 2023-11-30 DIAGNOSIS — M25512 Pain in left shoulder: Secondary | ICD-10-CM | POA: Diagnosis not present

## 2023-11-30 DIAGNOSIS — M5416 Radiculopathy, lumbar region: Secondary | ICD-10-CM | POA: Diagnosis not present

## 2023-11-30 DIAGNOSIS — Z9689 Presence of other specified functional implants: Secondary | ICD-10-CM | POA: Diagnosis not present

## 2023-12-01 ENCOUNTER — Ambulatory Visit: Admitting: Gastroenterology

## 2023-12-01 ENCOUNTER — Telehealth (HOSPITAL_COMMUNITY): Payer: Self-pay | Admitting: Vascular Surgery

## 2023-12-01 ENCOUNTER — Encounter: Payer: Self-pay | Admitting: Gastroenterology

## 2023-12-01 VITALS — BP 104/70 | HR 90 | Ht 65.0 in | Wt 169.0 lb

## 2023-12-01 DIAGNOSIS — R1319 Other dysphagia: Secondary | ICD-10-CM

## 2023-12-01 DIAGNOSIS — K5909 Other constipation: Secondary | ICD-10-CM | POA: Diagnosis not present

## 2023-12-01 DIAGNOSIS — I509 Heart failure, unspecified: Secondary | ICD-10-CM | POA: Diagnosis not present

## 2023-12-01 DIAGNOSIS — G8929 Other chronic pain: Secondary | ICD-10-CM | POA: Diagnosis not present

## 2023-12-01 DIAGNOSIS — R1030 Lower abdominal pain, unspecified: Secondary | ICD-10-CM | POA: Diagnosis not present

## 2023-12-01 DIAGNOSIS — R1310 Dysphagia, unspecified: Secondary | ICD-10-CM | POA: Diagnosis not present

## 2023-12-01 DIAGNOSIS — Z1211 Encounter for screening for malignant neoplasm of colon: Secondary | ICD-10-CM

## 2023-12-01 MED ORDER — PEG 3350-KCL-NA BICARB-NACL 420 G PO SOLR: 4000.0000 mL | Freq: Once | ORAL | 0 refills | Status: AC

## 2023-12-01 MED ORDER — METOCLOPRAMIDE HCL 5 MG PO TABS: 5.0000 mg | ORAL_TABLET | Freq: Two times a day (BID) | ORAL | 0 refills | Status: DC | PRN

## 2023-12-01 NOTE — Progress Notes (Signed)
 [facility] Consult Note:  History: [name] 12/01/2023  Referring provider: Annette Barters, MD  Reason for consult/chief complaint: Colonoscopy (Patient is here to discuss colonoscopy) and Abdominal Pain (Patient states she has lower abdominal pain that is sharp and comes and goes. Pain started 6 months ago.)   Subjective  Prior history:  From Dr Dominic Friendly office note Nov 2017:  She feels intermittent nausea and chronic fatigue. Her upper endoscopy showed a healthy appearing gastric bypass, and I felt that her nausea was likely due to polypharmacy, chronic diabetes, and perhaps the effect of occasional opioid analgesic use. Further research has found that her LFTs have been elevated to variable degrees, but at least as high as the recent elevations as far back as 2013. Repeat LFTs November 2017 showed normal transaminases, alk phos 146, anti-smooth muscle antibody negative July 2018 LFTs AST 37, ALT 100, alkaline phosphatase 213   Clinic visit with Dr Howard Macho in July 2022 for lower abdominal pain was referred to us  at that time for colon cancer screening and consideration of colonoscopy.  At that time she was having intermittent lower abdominal sharp pain and also diffuse musculoskeletal pain on opioids  Colonoscopy in 2013   Discussed the use of AI scribe software for clinical note transcription with the patient, who gave verbal consent to proceed.  History of Present Illness [name] is a 53 year old female with a history of fibromyalgia and gastric bypass who presents with abdominal pain and bowel movement issues.  She has been experiencing abdominal pain for several months, primarily located in the lower abdomen but sometimes radiating upwards. The pain is described as sharp, especially when lying on her stomach. She associates some of the pain with her fibromyalgia but suspects there may be other contributing factors.  She has a history of  gastric bypass surgery and had been taking ibuprofen  for a long time, but has since stopped. She initially used Amitiza  for constipation, which eventually became ineffective, and is currently taking Linzess, which helps her have smoother bowel movements. She attributes her constipation to the amount of medication she takes.  She experiences difficulty swallowing, with food sometimes feeling stuck in her esophagus, which she attributes to her gastric bypass. She describes episodes where she feels like she cannot breathe because the food won't go down, and sometimes she has to induce vomiting to relieve the sensation. She mentions having dumping syndrome since her gastric bypass surgery, but describes her symptoms as food getting stuck and sometimes needing to induce vomiting.     ROS:  Review of Systems  Constitutional:  Positive for fatigue. Negative for appetite change and unexpected weight change.  HENT:  Negative for mouth sores and voice change.   Eyes:  Negative for pain and redness.  Respiratory:  Negative for cough and shortness of breath.   Cardiovascular:  Negative for chest pain and palpitations.  Genitourinary:  Negative for dysuria and hematuria.  Musculoskeletal:  Positive for arthralgias and myalgias.  Skin:  Negative for pallor and rash.  Neurological:  Negative for weakness and headaches.  Hematological:  Negative for adenopathy.     Past Medical History: Past Medical History:  Diagnosis Date   Anemia    hx of iron transfusions - last one 10/2013    Anxiety    Arthritis    neck and shoulders    Asthma    Back pain, chronic    Bipolar disorder (HCC)    Carpal tunnel  syndrome on both sides    Cataract    Depression    Diabetes mellitus    Fibromyalgia    GERD (gastroesophageal reflux disease)    History of gastric bypass Jan 2013   Hyperlipidemia    Hypertension    Insomnia    Migraines    Nervous breakdown sept 2013   Neuropathy    Nonischemic  cardiomyopathy (HCC)    OSA (obstructive sleep apnea) 02/21/2015   Peripheral vascular disease (HCC)    Shortness of breath    with exertion    Sleep apnea    borderline- no cpap yet   Systolic CHF, acute on chronic (HCC)    CARDIOLOGIST-- DR Christoper Crafts  [employer] [facility])   Wound of left breast    From cardiology/CHF clinic note April 08, 2023: Echo in 3/21 showed EF 50-55% with normal RV.    Follow up 6/22 she had mild chest pain with exertion, coronary CTA arranged which showed no obstructive disease.   Echo 12/23 EF remains 50-55%, normal RV.    Today she returns for HF follow up. Overall feeling fine. She has SOB walking uphill. Felt chest heaviness last week, spontaneously resolved.  Balance is poor, no falls, but she has positional dizziness nearly daily.  She uses clonidine PRN at nighttime for hot flashes. She has historically had lightheadedness with this. Denies palpitations, edema, or PND/Orthopnea. Appetite ok. No fever or chills. Weight at home 160 pounds. Taking all medications. Wears CPAP. No tobacco/ETOH/drug use.   ECG (personally reviewed): NSR 80 bpm  Patient had issues with intermittent orthostasis including at that clinic visit, some medicine changes were made.  Plan was for 51-month follow-up visit with Dr. Mitzie Anda along with an echocardiogram  Past Surgical History: Past Surgical History:  Procedure Laterality Date   BACK SURGERY  04/1991   BREAST CYST ASPIRATION Left    BREAST SURGERY Left    I&D for abcsess    CARDIAC CATHETERIZATION  11-14-2013  DR DALTON MCLEAN   NORMAL LV FILLING PRESSURE W/ MILD ELEVATED RV FILLING, POSSIBLY SUFFESTING RV FAILURE OUT OF PROPORTION TO LV FAILURE. EF ABOUT 35% WITH DIFFUSE HYPOKINESIS. NO ANGIOGRAPHIC CAD, NONISCHEMIC CARDIOMYOPATHY   CARPAL TUNNEL RELEASE Bilateral    CATARACT EXTRACTION Right    CESAREAN SECTION  05/1997   CHOLECYSTECTOMY  1998   DILITATION & CURRETTAGE/HYSTROSCOPY WITH NOVASURE ABLATION N/A 02/26/2014    Procedure: DILATATION & CURETTAGE/HYSTEROSCOPY WITH NOVASURE ABLATION;  Surgeon: Piedad Brewer, MD;  Location: [facility];  Service: Gynecology;  Laterality: N/A;   ESOPHAGOGASTRODUODENOSCOPY  07/27/2011   Procedure: ESOPHAGOGASTRODUODENOSCOPY (EGD);  Surgeon: Yves Herb, MD;  Location: [facility];  Service: Endoscopy;  Laterality: N/A;   GASTRIC ROUX-EN-Y  06/30/2011   Procedure: LAPAROSCOPIC ROUX-EN-Y GASTRIC;  Surgeon: Quitman Bucy, MD;  Location: [facility];  Service: General;  Laterality: N/A;      INCISION AND DRAINAGE OF WOUND Left 12/14/2013   Procedure: IRRIGATION AND DEBRIDEMENT OF LEFT BREAST WOUND WITH PLACEMENT OF A-CELL, WITH CLOSURE ;  Surgeon: Marilou Showman, DO;  Location: [facility];  Service: Plastics;  Laterality: Left;   IRRIGATION AND DEBRIDEMENT ABSCESS Left 11/26/2013   Procedure: IRRIGATION AND DEBRIDEMENT ABSCESS;  Surgeon: Keitha Pata, MD;  Location: [facility];  Service: General;  Laterality: Left;   LEFT AND RIGHT HEART CATHETERIZATION WITH CORONARY ANGIOGRAM N/A 11/14/2013   Procedure: LEFT AND RIGHT HEART CATHETERIZATION WITH CORONARY ANGIOGRAM;  Surgeon: Darlis Eisenmenger, MD;  Location: [facility] CATH [facility];  Service: Cardiovascular;  Laterality: N/A;   SPINAL CORD STIMULATOR INSERTION N/A 03/09/2014   Procedure: LUMBAR SPINAL CORD STIMULATOR INSERTION;  Surgeon: Darryl Endow, MD;  Location: [facility];  Service: Neurosurgery;  Laterality: N/A;   TUBAL LIGATION  06/1997     Family History: Family History  Problem Relation Age of Onset   Hypertension Mother    Heart disease Sister    Hypertension Brother    Heart disease Brother    Diabetes Maternal Aunt    Diabetes Paternal Aunt    Diabetes Paternal Grandmother    Diabetes Paternal Grandfather    Hypertension Paternal Grandfather    Heart attack Neg Hx    Stroke Neg Hx    Colon cancer Neg Hx    Colon polyps Neg Hx    Esophageal cancer Neg Hx    Rectal cancer Neg Hx    Stomach cancer Neg Hx     Breast cancer Neg Hx     Social History: Social History   Socioeconomic History   Marital status: Single    Spouse name: Not on file   Number of children: Not on file   Years of education: Not on file   Highest education level: Not on file  Occupational History   Not on file  Tobacco Use   Smoking status: Never   Smokeless tobacco: Never  Vaping Use   Vaping status: Never Used  Substance and Sexual Activity   Alcohol use: No   Drug use: No   Sexual activity: Never  Other Topics Concern   Not on file  Social History Narrative   ** Merged History Encounter **       Social Drivers of Corporate investment banker Strain: Not on file  Food Insecurity: Not on file  Transportation Needs: Not on file  Physical Activity: Not on file  Stress: Not on file  Social Connections: Not on file    Allergies: Allergies  Allergen Reactions   Imitrex  [Sumatriptan ] Other (See Comments)    Acid reflux---pill only-- shots are okay    Lamictal [Lamotrigine] Nausea And Vomiting   Metformin And Related Diarrhea    Outpatient Meds: Current Outpatient Medications  Medication Sig Dispense Refill   ACCU-CHEK GUIDE test strip 1 each by Other route 3 (three) times daily.     albuterol  (VENTOLIN  HFA) 108 (90 Base) MCG/ACT inhaler Inhale 2 puffs into the lungs every 4 (four) hours as needed for wheezing or shortness of breath.     Biotin  5000 MCG TABS Take 5,000 mcg by mouth daily.     Cholecalciferol (VITAMIN D3) 5000 units TABS Take 5,000 Units by mouth daily.      cloNIDine (CATAPRES) 0.1 MG tablet Take 0.1 mg by mouth 2 (two) times daily.     cyclobenzaprine  (FLEXERIL ) 10 MG tablet Take 10 mg by mouth 3 (three) times daily as needed for muscle spasms.      diclofenac  sodium (VOLTAREN ) 1 % GEL Apply 2 g topically 2 (two) times daily.     DULoxetine  (CYMBALTA ) 30 MG capsule Take 30 mg by mouth daily.     DULoxetine  (CYMBALTA ) 60 MG capsule Take 60 mg by mouth daily.     famotidine  (PEPCID )  20 MG tablet Take 20 mg by mouth daily.     fluticasone  (FLONASE ) 50 MCG/ACT nasal spray Place 1 spray into both nostrils daily.      ibuprofen  (ADVIL ,MOTRIN ) 800 MG tablet Take 800 mg by mouth every 8 (eight) hours as needed (pain).  JARDIANCE 10 MG TABS tablet Take 10 mg by mouth daily.     lidocaine  4 % Place 1 patch onto the skin daily. 15 patch 0   lubiprostone  (AMITIZA ) 24 MCG capsule Take 24 mcg by mouth 2 (two) times daily.     metoprolol  succinate (TOPROL -XL) 50 MG 24 hr tablet TAKE 1 TABLET BY MOUTH EVERY DAY 90 tablet 3   montelukast  (SINGULAIR ) 10 MG tablet Take 10 mg by mouth daily.     MOUNJARO 7.5 MG/0.5ML Pen Inject 7.5 mg into the skin once a week.     nystatin ointment (MYCOSTATIN) nystatin 100,000 unit/gram topical ointment  APPLY TO AFFECTED AREA TWICE A DAY TO RASH     ondansetron  (ZOFRAN ) 4 MG tablet TAKE 1 TABLET BY MOUTH EVERY 8 HOURS AS NEEDED FOR NAUSEA AND VOMITING 20 tablet 0   oxyCODONE -acetaminophen  (PERCOCET) 10-325 MG per tablet Take 0.5-1 tablets by mouth every 4 (four) hours as needed for pain.     potassium chloride  (KLOR-CON ) 10 MEQ tablet TAKE 6 TABLETS BY MOUTH 2 (TWO) TIMES DAILY FOR 3 DAYS, THEN 6 TABLETS (60 MEQ TOTAL) DAILY. 180 tablet 3   pregabalin  (LYRICA ) 150 MG capsule Take 150 mg by mouth 3 (three) times daily.     promethazine  (PHENERGAN ) 25 MG tablet promethazine  25 mg tablet  TAKE 1/2 TO 1 TABLET BY MOUTH EVERY 6 HOURS AS NEEDED FOR NAUSEA     rosuvastatin  (CRESTOR ) 10 MG tablet Take 1 tablet (10 mg total) by mouth daily. 90 tablet 3   sacubitril -valsartan  (ENTRESTO ) 24-26 MG Take 1 tablet by mouth 2 (two) times daily. 60 tablet 11   solifenacin (VESICARE) 5 MG tablet solifenacin 5 mg tablet  TAKE 1 (ONE) TABLET DAILY FOR BLADDER CONTROL     spironolactone  (ALDACTONE ) 25 MG tablet Take 1 tablet (25 mg total) by mouth daily. 30 tablet 6   torsemide  (DEMADEX ) 20 MG tablet Take 2 tablets (40 mg total) by mouth 2 (two) times daily for 3 days,  THEN 1 tablet (20 mg total) 2 (two) times daily. 180 tablet 3   No current facility-administered medications for this visit.      ___________________________________________________________________ Objective   Exam:  Ht 5' 5 (1.651 m)   Wt 169 lb (76.7 kg)   BMI 28.12 kg/m  Wt Readings from Last 3 Encounters:  12/01/23 169 lb (76.7 kg)  06/17/23 169 lb (76.7 kg)  05/24/23 158 lb (71.7 kg)    General: Pleasant and conversational.  Ambulatory, gets on exam table without assistance Eyes: sclera anicteric, no redness ENT: oral mucosa moist without lesions, no cervical or supraclavicular lymphadenopathy CV: Regular , no JVD, no peripheral edema Resp: clear to auscultation bilaterally, normal RR and effort noted GI: soft, no focal tenderness, with active bowel sounds. No guarding or palpable organomegaly noted.  No bruit Skin; warm and dry, no rash or jaundice noted Neuro: awake, alert and oriented x 3. Normal gross motor function and fluent speech   Labs:  December 2023 echocardiogram  IMPRESSIONS     1. Left ventricular ejection fraction, by estimation, is 50 to 55%. The  left ventricle has low normal function. The left ventricle has no regional  wall motion abnormalities. Left ventricular diastolic parameters are  consistent with Grade I diastolic  dysfunction (impaired relaxation).   2. Right ventricular systolic function is normal. The right ventricular  size is normal. There is normal pulmonary artery systolic pressure. The  estimated right ventricular systolic pressure is 23.2 mmHg.  3. The mitral valve is normal in structure. Trivial mitral valve  regurgitation. No evidence of mitral stenosis.   4. The aortic valve is tricuspid. Aortic valve regurgitation is not  visualized. No aortic stenosis is present.   5. The inferior vena cava is normal in size with greater than 50%  respiratory variability, suggesting right atrial pressure of 3 mmHg.    No diagnosis  found.  Assessment and Plan Assessment & Plan Abdominal pain Chronic lower abdominal pain, possibly linked to OIC Pain was sometimes moving toward the upper abdomen and she was using ibuprofen  until primary care recommended she stop it. - Schedule colonoscopy to evaluate for colon cancer and polyps.  Dysphagia Intermittent dysphagia, possibly due to esophageal narrowing post-gastric bypass. - Schedule upper endoscopy to evaluate esophageal and gastric anatomy.  Constipation Chronic constipation managed with Linzess after Amitiza  failure, likely medication-related.  Heart failure Improved cardiac function, orthostatic hypotension noted, cardiology evaluation needed before endoscopy. - She is overdue for follow-up with that clinic.  I will forward my note to them and recommend that she contact them today for a visit.  Endoscopic procedures were booked out far enough to allow her time to see cardiology in the interim  She was agreeable to EGD and colonoscopy after discussion of procedure and risks.  The benefits and risks of the planned procedure(s) were described in detail with the patient or (when appropriate) their health care proxy.  Risks were outlined as including, but not limited to, bleeding, infection, perforation, adverse medication reaction leading to cardiac or pulmonary decompensation, pancreatitis (if ERCP).  The limitation of incomplete mucosal visualization was also discussed.  No guarantees or warranties were given.  Patient at increased risk for cardiopulmonary complications of procedure due to medical comorbidities.  GoLytely  bowel preparation and 2 doses of Reglan  to take before evening and morning doses of bowel prep (chronic constipation)   Thank you for the courtesy of this consult.  Please call me with any questions or concerns.  Kerby Pearson III  CC: Referring provider noted above

## 2023-12-01 NOTE — Telephone Encounter (Signed)
 Lvm to make f/u appt w/ echo w/ [facility]

## 2023-12-01 NOTE — Patient Instructions (Addendum)
 We have sent the following medications to your pharmacy for you to pick up at your convenience: Reglan  - take one tablet 30 minutes before drinking colonoscopy prep.   Please contact your cardiologist to schedule an appointment before your procedure on 01/18/24.  You have been scheduled for an endoscopy and colonoscopy. Please follow the written instructions given to you at your visit today.  If you use inhalers (even only as needed), please bring them with you on the day of your procedure.  DO NOT TAKE 7 DAYS PRIOR TO TEST- Trulicity (dulaglutide) Ozempic , Wegovy  (semaglutide ) Mounjaro (tirzepatide) Bydureon Bcise (exanatide extended release)  DO NOT TAKE 1 DAY PRIOR TO YOUR TEST Rybelsus  (semaglutide ) Adlyxin (lixisenatide) Victoza (liraglutide) Byetta (exanatide) ___________________________________________________________________________  _______________________________________________________  If your blood pressure at your visit was 140/90 or greater, please contact your primary care physician to follow up on this.  _______________________________________________________  If you are age 40 or older, your body mass index should be between 23-30. Your Body mass index is 28.12 kg/m. If this is out of the aforementioned range listed, please consider follow up with your Primary Care Provider.  If you are age 57 or younger, your body mass index should be between 19-25. Your Body mass index is 28.12 kg/m. If this is out of the aformentioned range listed, please consider follow up with your Primary Care Provider.   ________________________________________________________  The [facility] providers would like to encourage you to use MYCHART to communicate with providers for non-urgent requests or questions.  Due to long hold times on the telephone, sending your provider a message by [facility] may be a faster and more efficient way to get a response.  Please allow 48 business hours for a  response.  Please remember that this is for non-urgent requests.  _______________________________________________________  Thank you for trusting me with your gastrointestinal care!    Dr. Kaleen [facility]

## 2023-12-03 MED ORDER — SPIRONOLACTONE 25 MG PO TABS: 25.0000 mg | ORAL_TABLET | Freq: Every day | ORAL | 0 refills | Status: DC

## 2024-01-18 ENCOUNTER — Ambulatory Visit (AMBULATORY_SURGERY_CENTER): Admitting: Gastroenterology

## 2024-01-18 ENCOUNTER — Encounter: Payer: Self-pay | Admitting: Gastroenterology

## 2024-01-18 VITALS — BP 120/66 | HR 78 | Temp 97.2°F | Resp 15 | Ht 65.0 in | Wt 169.0 lb

## 2024-01-18 DIAGNOSIS — I10 Essential (primary) hypertension: Secondary | ICD-10-CM | POA: Diagnosis not present

## 2024-01-18 DIAGNOSIS — Q438 Other specified congenital malformations of intestine: Secondary | ICD-10-CM | POA: Diagnosis not present

## 2024-01-18 DIAGNOSIS — R1310 Dysphagia, unspecified: Secondary | ICD-10-CM | POA: Diagnosis not present

## 2024-01-18 DIAGNOSIS — G4733 Obstructive sleep apnea (adult) (pediatric): Secondary | ICD-10-CM | POA: Diagnosis not present

## 2024-01-18 DIAGNOSIS — M797 Fibromyalgia: Secondary | ICD-10-CM | POA: Diagnosis not present

## 2024-01-18 DIAGNOSIS — K5909 Other constipation: Secondary | ICD-10-CM | POA: Diagnosis not present

## 2024-01-18 DIAGNOSIS — Z980 Intestinal bypass and anastomosis status: Secondary | ICD-10-CM | POA: Diagnosis not present

## 2024-01-18 DIAGNOSIS — R1319 Other dysphagia: Secondary | ICD-10-CM

## 2024-01-18 DIAGNOSIS — R1030 Lower abdominal pain, unspecified: Secondary | ICD-10-CM

## 2024-01-18 MED ORDER — SODIUM CHLORIDE 0.9 % IV SOLN: 500.0000 mL | Freq: Once | INTRAVENOUS | Status: DC

## 2024-01-18 NOTE — Progress Notes (Signed)
 Sedate, gd SR, tolerated procedure well, VSS, report to RN

## 2024-01-18 NOTE — Op Note (Signed)
 [facility] Patient Name: [name] Procedure Date: 01/18/2024 9:48 AM MRN: [id number] Endoscopist: Victory L. Legrand , MD, [id number] Age: 53 Referring MD:  Date of Birth: [birth year] Gender: Female Account #: [id number] Procedure:                Upper GI endoscopy Indications:              Esophageal dysphagia Medicines:                Monitored Anesthesia Care Procedure:                Pre-Anesthesia Assessment:                           - Prior to the procedure, a History and Physical                            was performed, and patient medications and                            allergies were reviewed. The patient's tolerance of                            previous anesthesia was also reviewed. The risks                            and benefits of the procedure and the sedation                            options and risks were discussed with the patient.                            All questions were answered, and informed consent                            was obtained. Prior Anticoagulants: The patient has                            taken no anticoagulant or antiplatelet agents. ASA                            Grade Assessment: III - A patient with severe                            systemic disease. After reviewing the risks and                            benefits, the patient was deemed in satisfactory                            condition to undergo the procedure.                           After obtaining informed consent, the endoscope was  passed under direct vision. Throughout the                            procedure, the patient's blood pressure, pulse, and                            oxygen saturations were monitored continuously. The                            Olympus Scope [id number] was introduced through the                            mouth, and advanced to the jejunum. The upper GI                            endoscopy was accomplished  without difficulty. The                            patient tolerated the procedure well. Scope In: Scope Out: Findings:                 The larynx was normal.                           The esophagus was normal. Active motility was seen,                            and no stricture was visualized.                           Evidence of a gastric bypass was found. A gastric                            pouch with a normal size was found containing food                            debris. The gastrojejunal anastomosis was                            characterized by healthy appearing mucosa. This was                            traversed.                           The examined jejunum was normal. Complications:            No immediate complications. Estimated Blood Loss:     Estimated blood loss: none. Impression:               - Normal larynx.                           - Normal esophagus.                           - Gastric bypass with a  normal-sized pouch.                            Gastrojejunal anastomosis characterized by healthy                            appearing mucosa.                           - Normal examined jejunum.                           - No specimens collected. Recommendation:           - Patient has a contact number available for                            emergencies. The signs and symptoms of potential                            delayed complications were discussed with the                            patient. Return to normal activities tomorrow.                            Written discharge instructions were provided to the                            patient.                           - Resume previous diet.                           - Continue present medications.                           - See the other procedure note for documentation of                            additional recommendations.                           - Findings on both EGD and colonoscopy today                             suggest both upper and lower GI dysmotility. Marley Charlot L. Legrand, MD 01/18/2024 10:29:03 AM This report has been signed electronically.

## 2024-01-18 NOTE — Patient Instructions (Addendum)
 Resume all of your previous medications today as ordered. You have a follow-up appointment scheduled with Dr Legrand. After that visit, we will schedule another colonoscopy. Read all of your discharge instructions.     [facility] AN [facility]:   Refer to the procedure report that was given to you for any specific questions about what was found during the examination.  If the procedure report does not answer your questions, please call your gastroenterologist to clarify.  If you requested that your care partner not be given the details of your procedure findings, then the procedure report has been included in a sealed envelope for you to review at your convenience later.  YOU SHOULD EXPECT: Some feelings of bloating in the abdomen. Passage of more gas than usual.  Walking can help get rid of the air that was put into your GI tract during the procedure and reduce the bloating. If you had a lower endoscopy (such as a colonoscopy or flexible sigmoidoscopy) you may notice spotting of blood in your stool or on the toilet paper. If you underwent a bowel prep for your procedure, you may not have a normal bowel movement for a few days.  Please Note:  You might notice some irritation and congestion in your nose or some drainage.  This is from the oxygen used during your procedure.  There is no need for concern and it should clear up in a day or so.  SYMPTOMS TO REPORT IMMEDIATELY:  Following lower endoscopy (colonoscopy or flexible sigmoidoscopy):  Excessive amounts of blood in the stool  Significant tenderness or worsening of abdominal pains  Swelling of the abdomen that is new, acute  Fever of 100F or higher  Following upper endoscopy (EGD)  Vomiting of blood or coffee ground material  New chest pain or pain under the shoulder blades  Painful or persistently difficult swallowing  New shortness of breath  Fever of 100F or higher  Black, tarry-looking  stools  For urgent or emergent issues, a gastroenterologist can be reached at any hour by calling [phone]. Do not use MyChart messaging for urgent concerns.    DIET:  We do recommend a small meal at first, but then you may proceed to your regular diet.  Drink plenty of fluids but you should avoid alcoholic beverages for 24 hours.  ACTIVITY:  You should plan to take it easy for the rest of today and you should NOT DRIVE or use heavy machinery until tomorrow (because of the sedation medicines used during the test).    FOLLOW UP: Our staff will call the number listed on your records the next business day following your procedure.  We will call around 7:15- 8:00 am to check on you and address any questions or concerns that you may have regarding the information given to you following your procedure. If we do not reach you, we will leave a message.      SIGNATURES/CONFIDENTIALITY: You and/or your care partner have signed paperwork which will be entered into your electronic medical record.  These signatures attest to the fact that that the information above on your After Visit Summary has been reviewed and is understood.  Full responsibility of the confidentiality of this discharge information lies with you and/or your care-partner.

## 2024-01-18 NOTE — Op Note (Signed)
 [facility] Patient Name: [name] Procedure Date: 01/18/2024 9:49 AM MRN: [id number] Endoscopist: Victory L. Legrand , MD, [id number] Age: 53 Referring MD:  Date of Birth: 1971-04-15 Gender: Female Account #: [id number] Procedure:                Colonoscopy Indications:              Lower abdominal pain, Constipation Medicines:                Monitored Anesthesia Care Procedure:                Pre-Anesthesia Assessment:                           - Prior to the procedure, a History and Physical                            was performed, and patient medications and                            allergies were reviewed. The patient's tolerance of                            previous anesthesia was also reviewed. The risks                            and benefits of the procedure and the sedation                            options and risks were discussed with the patient.                            All questions were answered, and informed consent                            was obtained. Prior Anticoagulants: The patient has                            taken no anticoagulant or antiplatelet agents. ASA                            Grade Assessment: III - A patient with severe                            systemic disease. After reviewing the risks and                            benefits, the patient was deemed in satisfactory                            condition to undergo the procedure.                           After obtaining informed consent, the colonoscope  was passed under direct vision. Throughout the                            procedure, the patient's blood pressure, pulse, and                            oxygen saturations were monitored continuously. The                            Olympus CF-HQ190L ([id number]) Colonoscope was                            introduced through the anus with the intention of                            advancing to the cecum.  The scope was advanced to                            the ascending colon before the procedure was                            aborted. Medications were given. The colonoscopy                            was performed with difficulty due to inadequate                            bowel prep and a redundant colon. The patient                            tolerated the procedure well. The bowel preparation                            used was GoLYTELY . Scope In: 9:58:26 AM Scope Out: 10:10:28 AM Total Procedure Duration: 0 hours 12 minutes 2 seconds  Findings:                 The perianal and digital rectal examinations were                            normal.                           Copious quantities of thick stool was found in the                            entire colon, precluding visualization. Scope                            advanced to ascending colon (ICV briefly seen in                            distance), but poor visualization precluded further  scope advancement. Complications:            No immediate complications. Estimated Blood Loss:     Estimated blood loss: none. Impression:               - Stool in the entire examined colon.                           - No specimens collected. Recommendation:           - Patient has a contact number available for                            emergencies. The signs and symptoms of potential                            delayed complications were discussed with the                            patient. Return to normal activities tomorrow.                            Written discharge instructions were provided to the                            patient.                           - Resume previous diet.                           - Continue present medications.                           - See the other procedure note for documentation of                            additional recommendations.                           - Repeat  colonoscopy in 3-6 months if patient                            willing and able to take two sequential Golytely                             preps.                           Follow up with APP in clinic before that for                            ongoing management of chronic constipation.                           (motegrity if obtainable, possibly therapy directed  at [facility]) Victory L. Legrand, MD 01/18/2024 10:19:02 AM This report has been signed electronically.

## 2024-01-18 NOTE — Progress Notes (Signed)
 History and Physical:  This patient presents for endoscopic testing for: Encounter Diagnoses  Name Primary?   Lower abdominal pain Yes   Esophageal dysphagia     Clinical details in 12/01/23 office note Multiple GI symptoms being evaluated today with EGD/colonoscopy Dysphagia, generalized (but primarily lower) abdominal pain, chronic constipation  Patient is otherwise without complaints or active issues today.   Past Medical History: Past Medical History:  Diagnosis Date   Anemia    hx of iron transfusions - last one 10/2013    Anxiety    Arthritis    neck and shoulders    Asthma    Back pain, chronic    Bipolar disorder (HCC)    Carpal tunnel syndrome on both sides    Cataract    Depression    Diabetes mellitus    Fibromyalgia    GERD (gastroesophageal reflux disease)    History of gastric bypass Jan 2013   Hyperlipidemia    Hypertension    Insomnia    Migraines    Nervous breakdown sept 2013   Neuropathy    Nonischemic cardiomyopathy (HCC)    OSA (obstructive sleep apnea) 02/21/2015   Peripheral vascular disease (HCC)    Shortness of breath    with exertion    Sleep apnea    borderline- no cpap yet   Systolic CHF, acute on chronic (HCC)    CARDIOLOGIST-- DR LEIM MOOSE  ([place])   Wound of left breast      Past Surgical History: Past Surgical History:  Procedure Laterality Date   BACK SURGERY  04/1991   BREAST CYST ASPIRATION Left    BREAST SURGERY Left    I&D for abcsess    CARDIAC CATHETERIZATION  11-14-2013  DR DALTON MCLEAN   NORMAL LV FILLING PRESSURE W/ MILD ELEVATED RV FILLING, POSSIBLY SUFFESTING RV FAILURE OUT OF PROPORTION TO LV FAILURE. EF ABOUT 35% WITH DIFFUSE HYPOKINESIS. NO ANGIOGRAPHIC CAD, NONISCHEMIC CARDIOMYOPATHY   CARPAL TUNNEL RELEASE Bilateral    CATARACT EXTRACTION Right    CESAREAN SECTION  05/1997   CHOLECYSTECTOMY  1998   DILITATION & CURRETTAGE/HYSTROSCOPY WITH NOVASURE ABLATION N/A 02/26/2014   Procedure: DILATATION &  CURETTAGE/HYSTEROSCOPY WITH NOVASURE ABLATION;  Surgeon: Charlie CHRISTELLA Croak, MD;  Location: [facility];  Service: Gynecology;  Laterality: N/A;   ESOPHAGOGASTRODUODENOSCOPY  07/27/2011   Procedure: ESOPHAGOGASTRODUODENOSCOPY (EGD);  Surgeon: Elsie CHRISTELLA Cree, MD;  Location: [facility];  Service: Endoscopy;  Laterality: N/A;   GASTRIC ROUX-EN-Y  06/30/2011   Procedure: LAPAROSCOPIC ROUX-EN-Y GASTRIC;  Surgeon: Morene ONEIDA Olives, MD;  Location: [facility];  Service: General;  Laterality: N/A;      INCISION AND DRAINAGE OF WOUND Left 12/14/2013   Procedure: IRRIGATION AND DEBRIDEMENT OF LEFT BREAST WOUND WITH PLACEMENT OF A-CELL, WITH CLOSURE ;  Surgeon: Estefana Reichert, DO;  Location: [facility];  Service: Plastics;  Laterality: Left;   IRRIGATION AND DEBRIDEMENT ABSCESS Left 11/26/2013   Procedure: IRRIGATION AND DEBRIDEMENT ABSCESS;  Surgeon: Krystal CHRISTELLA Spinner, MD;  Location: [facility];  Service: General;  Laterality: Left;   LEFT AND RIGHT HEART CATHETERIZATION WITH CORONARY ANGIOGRAM N/A 11/14/2013   Procedure: LEFT AND RIGHT HEART CATHETERIZATION WITH CORONARY ANGIOGRAM;  Surgeon: Ezra GORMAN Shuck, MD;  Location: [facility];  Service: Cardiovascular;  Laterality: N/A;   SPINAL CORD STIMULATOR INSERTION N/A 03/09/2014   Procedure: LUMBAR SPINAL CORD STIMULATOR INSERTION;  Surgeon: Deward JAYSON Fabian, MD;  Location: [facility];  Service: Neurosurgery;  Laterality: N/A;   TUBAL LIGATION  06/1997    Allergies: Allergies  Allergen Reactions   Imitrex  [Sumatriptan ] Other (See Comments)    Acid reflux---pill only-- shots are okay    Lamictal [Lamotrigine] Nausea And Vomiting   Metformin And Related Diarrhea    Outpatient Meds: Current Outpatient Medications  Medication Sig Dispense Refill   JARDIANCE 25 MG TABS tablet Take 25 mg by mouth every morning.     LINZESS 145 MCG CAPS capsule Take 145 mcg by mouth daily.     metoprolol  succinate (TOPROL -XL) 25 MG 24 hr tablet Take 25 mg by mouth daily.     MOUNJARO  15 MG/0.5ML Pen SMARTSIG:15 Milligram(s) SUB-Q Once a Week     polyethylene glycol-electrolytes (NULYTELY ) 420 g solution Take 4,000 mLs by mouth once.     rosuvastatin  (CRESTOR ) 5 MG tablet Take 5 mg by mouth daily.     VALIUM  5 MG tablet 1 PO 60  minutes prior to procedure with 2nd dose 15-20 min prior if needed     ACCU-CHEK GUIDE test strip 1 each by Other route 3 (three) times daily.     albuterol  (VENTOLIN  HFA) 108 (90 Base) MCG/ACT inhaler Inhale 2 puffs into the lungs every 4 (four) hours as needed for wheezing or shortness of breath.     Biotin  5000 MCG TABS Take 5,000 mcg by mouth daily.     Cholecalciferol (VITAMIN D3) 5000 units TABS Take 5,000 Units by mouth daily.      cloNIDine (CATAPRES) 0.1 MG tablet Take 0.1 mg by mouth 2 (two) times daily.     cyclobenzaprine  (FLEXERIL ) 10 MG tablet Take 10 mg by mouth 3 (three) times daily as needed for muscle spasms.      diclofenac  sodium (VOLTAREN ) 1 % GEL Apply 2 g topically 2 (two) times daily.     DULoxetine  (CYMBALTA ) 30 MG capsule Take 30 mg by mouth daily.     DULoxetine  (CYMBALTA ) 60 MG capsule Take 60 mg by mouth daily.     famotidine  (PEPCID ) 20 MG tablet Take 20 mg by mouth daily.     fluticasone  (FLONASE ) 50 MCG/ACT nasal spray Place 1 spray into both nostrils daily.      ibuprofen  (ADVIL ,MOTRIN ) 800 MG tablet Take 800 mg by mouth every 8 (eight) hours as needed (pain).     JARDIANCE 10 MG TABS tablet Take 10 mg by mouth daily.     lidocaine  4 % Place 1 patch onto the skin daily. 15 patch 0   lubiprostone  (AMITIZA ) 24 MCG capsule Take 24 mcg by mouth 2 (two) times daily.     metoCLOPramide  (REGLAN ) 5 MG tablet Take 1 tablet (5 mg total) by mouth every 12 (twelve) hours as needed for up to 2 doses for nausea. Take 30-45 minutes before evening and AM doses of bowel preparation solution. 2 tablet 0   metoprolol  succinate (TOPROL -XL) 50 MG 24 hr tablet TAKE 1 TABLET BY MOUTH EVERY DAY 90 tablet 3   montelukast  (SINGULAIR ) 10 MG  tablet Take 10 mg by mouth daily.     MOUNJARO 7.5 MG/0.5ML Pen Inject 7.5 mg into the skin once a week.     nystatin ointment (MYCOSTATIN) nystatin 100,000 unit/gram topical ointment  APPLY TO AFFECTED AREA TWICE A DAY TO RASH     ondansetron  (ZOFRAN ) 4 MG tablet TAKE 1 TABLET BY MOUTH EVERY 8 HOURS AS NEEDED FOR NAUSEA AND VOMITING 20 tablet 0   oxyCODONE -acetaminophen  (PERCOCET) 10-325 MG per tablet Take 0.5-1 tablets by mouth every 4 (four) hours  as needed for pain.     potassium chloride  (KLOR-CON ) 10 MEQ tablet TAKE 6 TABLETS BY MOUTH 2 (TWO) TIMES DAILY FOR 3 DAYS, THEN 6 TABLETS (60 MEQ TOTAL) DAILY. 180 tablet 3   pregabalin  (LYRICA ) 150 MG capsule Take 150 mg by mouth 3 (three) times daily.     promethazine  (PHENERGAN ) 25 MG tablet promethazine  25 mg tablet  TAKE 1/2 TO 1 TABLET BY MOUTH EVERY 6 HOURS AS NEEDED FOR NAUSEA     rosuvastatin  (CRESTOR ) 10 MG tablet Take 1 tablet (10 mg total) by mouth daily. 90 tablet 3   sacubitril -valsartan  (ENTRESTO ) 24-26 MG Take 1 tablet by mouth 2 (two) times daily. 60 tablet 11   solifenacin (VESICARE) 5 MG tablet solifenacin 5 mg tablet  TAKE 1 (ONE) TABLET DAILY FOR BLADDER CONTROL     spironolactone  (ALDACTONE ) 25 MG tablet Take 1 tablet (25 mg total) by mouth daily. 90 tablet 0   torsemide  (DEMADEX ) 20 MG tablet Take 2 tablets (40 mg total) by mouth 2 (two) times daily for 3 days, THEN 1 tablet (20 mg total) 2 (two) times daily. 180 tablet 3   Current Facility-Administered Medications  Medication Dose Route Frequency Provider Last Rate Last Admin   0.9 %  sodium chloride  infusion  500 mL Intravenous Once Danis, Victory CROME III, MD          ___________________________________________________________________ Objective   Exam:  BP 127/68   Pulse 84   Temp (!) 97.2 F (36.2 C) (Temporal)   Ht 5' 5 (1.651 m)   Wt 169 lb (76.7 kg)   SpO2 100%   BMI 28.12 kg/m   CV: regular , S1/S2 Resp: clear to auscultation bilaterally, normal RR and  effort noted GI: soft, no tenderness, with active bowel sounds.   Assessment: Encounter Diagnoses  Name Primary?   Lower abdominal pain Yes   Esophageal dysphagia      Plan: Colonoscopy EGD  The benefits and risks of the planned procedure(s) were described in detail with the patient or (when appropriate) their health care proxy.  Risks were outlined as including, but not limited to, bleeding, infection, perforation, adverse medication reaction leading to cardiac or pulmonary decompensation, pancreatitis (if ERCP).  The limitation of incomplete mucosal visualization was also discussed.  No guarantees or warranties were given.  The patient is appropriate for an endoscopic procedure in the ambulatory setting.   - Victory Brand, MD

## 2024-01-19 ENCOUNTER — Telehealth: Payer: Self-pay | Admitting: *Deleted

## 2024-01-19 NOTE — Telephone Encounter (Signed)
  Follow up Call-     01/18/2024    9:18 AM  Call back number  Post procedure Call Back phone  # [phone]  Permission to leave phone message Yes     Patient questions:  Do you have a fever, pain , or abdominal swelling? No. Pain Score  0 *  Have you tolerated food without any problems? Yes.    Have you been able to return to your normal activities? Yes.    Do you have any questions about your discharge instructions: Diet   No. Medications  No. Follow up visit  No.  Do you have questions or concerns about your Care? No.  Actions: * If pain score is 4 or above: No action needed, pain <4.

## 2024-01-31 ENCOUNTER — Telehealth: Payer: Self-pay | Admitting: Gastroenterology

## 2024-01-31 NOTE — Telephone Encounter (Signed)
 Inbound call from patient requesting to know if she should reschedule 8/5 procedures since she was not able to complete them. Please advise, thank you

## 2024-01-31 NOTE — Telephone Encounter (Signed)
 Per procedure note, patient should be seen in clinic for management of chronic constipation prior to rescheduling colonoscopy. Patient already has appointment for 03/21/2024 at 10:40 AM. Called and discussed with patient. She verbalized understanding.

## 2024-02-10 DIAGNOSIS — M5416 Radiculopathy, lumbar region: Secondary | ICD-10-CM | POA: Diagnosis not present

## 2024-02-29 DIAGNOSIS — M5416 Radiculopathy, lumbar region: Secondary | ICD-10-CM | POA: Diagnosis not present

## 2024-02-29 DIAGNOSIS — Z9689 Presence of other specified functional implants: Secondary | ICD-10-CM | POA: Diagnosis not present

## 2024-03-02 ENCOUNTER — Telehealth (HOSPITAL_COMMUNITY): Payer: Self-pay | Admitting: Cardiology

## 2024-03-02 ENCOUNTER — Other Ambulatory Visit (HOSPITAL_COMMUNITY): Payer: Self-pay | Admitting: Cardiology

## 2024-03-15 ENCOUNTER — Other Ambulatory Visit (HOSPITAL_COMMUNITY): Payer: Self-pay | Admitting: Family Medicine

## 2024-03-20 ENCOUNTER — Encounter: Payer: Self-pay | Admitting: Hematology

## 2024-03-21 ENCOUNTER — Ambulatory Visit (INDEPENDENT_AMBULATORY_CARE_PROVIDER_SITE_OTHER): Admitting: Gastroenterology

## 2024-03-21 ENCOUNTER — Encounter: Payer: Self-pay | Admitting: Gastroenterology

## 2024-03-21 VITALS — BP 108/72 | HR 86 | Ht 65.0 in | Wt 159.2 lb

## 2024-03-21 DIAGNOSIS — K5909 Other constipation: Secondary | ICD-10-CM

## 2024-03-21 MED ORDER — NALOXEGOL OXALATE 25 MG PO TABS: 25.0000 mg | ORAL_TABLET | Freq: Every day | ORAL | 1 refills | Status: DC

## 2024-03-21 NOTE — Progress Notes (Signed)
 [facility] Progress Note  Chief Complaint: Chronic constipation  Subjective  Prior history  Abdominal pain primarily located in the lower abdomen but sometimes radiating upwards. The pain is described as sharp, especially when lying on her stomach. She associates some of the pain with her fibromyalgia but suspects there may be other contributing factors.   She has a history of gastric bypass surgery with prior NSAID use.  She initially used Amitiza  for constipation, which eventually became ineffective, then began Linzess, which helps her have smoother bowel movements.   She experiences difficulty swallowing, with food sometimes feeling stuck in her esophagus, which she attributes to her gastric bypass. She describes episodes where she feels like she cannot breathe because the food won't go down, and sometimes she has to induce vomiting to relieve the sensation. She mentions having dumping syndrome since her gastric bypass surgery, but describes her symptoms as food getting stuck and sometimes needing to induce vomiting.  01/18/2024: EGD -normal GBP anatomy, no stricture, scope advanced to the jejunum.  Food debris in gastric pouch Colonoscopy to the ascending colon, aborted due to very poor prep with thick stool throughout the colon.  Discussed the use of AI scribe software for clinical note transcription with the patient, who gave verbal consent to proceed.  History of Present Illness [name] is a 53 year old female with constipation who presents with issues related to bowel preparation for a colonoscopy.  She experienced inadequate bowel preparation during her last colonoscopy due to consuming only half of the bowel prep solution the night before and missing the morning dose due to a missed alarm and time constraints.  She is currently taking Linzess and has daily bowel movements that are loose and watery, similar to diarrhea. No bleeding during bowel movements. She  experiences occasional, non-severe abdominal pain.  Her medication regimen includes Lyrica  for fibromyalgia and oxycodone  for pain management, which she tries to limit despite being prescribed four doses per day. She also takes muscle relaxers. She was previously on Amitiza , which was ineffective.  She is on Mounjaro and takes opioid pain medication.    ROS: Cardiovascular:  no chest pain Respiratory: no dyspnea  The patient's Past Medical, Family and Social History were reviewed and are on file in the EMR. Past Medical History:  Diagnosis Date   Anemia    hx of iron transfusions - last one 10/2013    Anxiety    Arthritis    neck and shoulders    Asthma    Back pain, chronic    Bipolar disorder (HCC)    Carpal tunnel syndrome on both sides    Cataract    Depression    Diabetes mellitus    Fibromyalgia    GERD (gastroesophageal reflux disease)    History of gastric bypass Jan 2013   Hyperlipidemia    Hypertension    Insomnia    Migraines    Nervous breakdown sept 2013   Neuropathy    Nonischemic cardiomyopathy (HCC)    OSA (obstructive sleep apnea) 02/21/2015   Peripheral vascular disease    Shortness of breath    with exertion    Sleep apnea    borderline- no cpap yet   Systolic CHF, acute on chronic (HCC)    CARDIOLOGIST-- DR LEIM MOOSE  ([place])   Wound of left breast     Past Surgical History:  Procedure Laterality Date   BACK SURGERY  04/1991   BREAST CYST ASPIRATION Left  BREAST SURGERY Left    I&D for abcsess    CARDIAC CATHETERIZATION  11-14-2013  DR EZRA SHUCK   NORMAL LV FILLING PRESSURE W/ MILD ELEVATED RV FILLING, POSSIBLY SUFFESTING RV FAILURE OUT OF PROPORTION TO LV FAILURE. EF ABOUT 35% WITH DIFFUSE HYPOKINESIS. NO ANGIOGRAPHIC CAD, NONISCHEMIC CARDIOMYOPATHY   CARPAL TUNNEL RELEASE Bilateral    CATARACT EXTRACTION Right    CESAREAN SECTION  05/1997   CHOLECYSTECTOMY  1998   DILITATION & CURRETTAGE/HYSTROSCOPY WITH NOVASURE ABLATION  N/A 02/26/2014   Procedure: DILATATION & CURETTAGE/HYSTEROSCOPY WITH NOVASURE ABLATION;  Surgeon: Charlie CHRISTELLA Croak, MD;  Location: [facility];  Service: Gynecology;  Laterality: N/A;   ESOPHAGOGASTRODUODENOSCOPY  07/27/2011   Procedure: ESOPHAGOGASTRODUODENOSCOPY (EGD);  Surgeon: Elsie CHRISTELLA Cree, MD;  Location: [facility];  Service: Endoscopy;  Laterality: N/A;   GASTRIC ROUX-EN-Y  06/30/2011   Procedure: LAPAROSCOPIC ROUX-EN-Y GASTRIC;  Surgeon: Morene ONEIDA Olives, MD;  Location: [facility];  Service: General;  Laterality: N/A;      INCISION AND DRAINAGE OF WOUND Left 12/14/2013   Procedure: IRRIGATION AND DEBRIDEMENT OF LEFT BREAST WOUND WITH PLACEMENT OF A-CELL, WITH CLOSURE ;  Surgeon: Estefana Reichert, DO;  Location: [facility];  Service: Plastics;  Laterality: Left;   IRRIGATION AND DEBRIDEMENT ABSCESS Left 11/26/2013   Procedure: IRRIGATION AND DEBRIDEMENT ABSCESS;  Surgeon: Krystal CHRISTELLA Spinner, MD;  Location: [facility];  Service: General;  Laterality: Left;   LEFT AND RIGHT HEART CATHETERIZATION WITH CORONARY ANGIOGRAM N/A 11/14/2013   Procedure: LEFT AND RIGHT HEART CATHETERIZATION WITH CORONARY ANGIOGRAM;  Surgeon: EZRA GORMAN SHUCK, MD;  Location: [facility];  Service: Cardiovascular;  Laterality: N/A;   SPINAL CORD STIMULATOR INSERTION N/A 03/09/2014   Procedure: LUMBAR SPINAL CORD STIMULATOR INSERTION;  Surgeon: Deward JAYSON Fabian, MD;  Location: [facility];  Service: Neurosurgery;  Laterality: N/A;   TUBAL LIGATION  06/1997     Objective:  Med list reviewed  Current Outpatient Medications:    ACCU-CHEK GUIDE test strip, 1 each by Other route 3 (three) times daily., Disp: , Rfl:    albuterol  (VENTOLIN  HFA) 108 (90 Base) MCG/ACT inhaler, Inhale 2 puffs into the lungs every 4 (four) hours as needed for wheezing or shortness of breath., Disp: , Rfl:    Biotin  5000 MCG TABS, Take 5,000 mcg by mouth daily., Disp: , Rfl:    Cholecalciferol (VITAMIN D3) 5000 units TABS, Take 5,000 Units by mouth daily. ,  Disp: , Rfl:    cloNIDine (CATAPRES) 0.1 MG tablet, Take 0.1 mg by mouth 2 (two) times daily., Disp: , Rfl:    cyclobenzaprine  (FLEXERIL ) 10 MG tablet, Take 10 mg by mouth 3 (three) times daily as needed for muscle spasms. , Disp: , Rfl:    diclofenac  sodium (VOLTAREN ) 1 % GEL, Apply 2 g topically 2 (two) times daily., Disp: , Rfl:    DULoxetine  (CYMBALTA ) 30 MG capsule, Take 30 mg by mouth daily., Disp: , Rfl:    DULoxetine  (CYMBALTA ) 60 MG capsule, Take 60 mg by mouth daily., Disp: , Rfl:    famotidine  (PEPCID ) 20 MG tablet, Take 20 mg by mouth daily., Disp: , Rfl:    fluticasone  (FLONASE ) 50 MCG/ACT nasal spray, Place 1 spray into both nostrils daily. , Disp: , Rfl:    ibuprofen  (ADVIL ,MOTRIN ) 800 MG tablet, Take 800 mg by mouth every 8 (eight) hours as needed (pain)., Disp: , Rfl:    JARDIANCE 10 MG TABS tablet, Take 10 mg by mouth daily., Disp: , Rfl:  JARDIANCE 25 MG TABS tablet, Take 25 mg by mouth every morning., Disp: , Rfl:    lidocaine  4 %, Place 1 patch onto the skin daily., Disp: 15 patch, Rfl: 0   metoCLOPramide  (REGLAN ) 5 MG tablet, Take 1 tablet (5 mg total) by mouth every 12 (twelve) hours as needed for up to 2 doses for nausea. Take 30-45 minutes before evening and AM doses of bowel preparation solution., Disp: 2 tablet, Rfl: 0   metoprolol  succinate (TOPROL -XL) 25 MG 24 hr tablet, Take 25 mg by mouth daily., Disp: , Rfl:    metoprolol  succinate (TOPROL -XL) 50 MG 24 hr tablet, TAKE 1 TABLET BY MOUTH EVERY DAY, Disp: 90 tablet, Rfl: 3   montelukast  (SINGULAIR ) 10 MG tablet, Take 10 mg by mouth daily., Disp: , Rfl:    MOUNJARO 15 MG/0.5ML Pen, SMARTSIG:15 Milligram(s) SUB-Q Once a Week, Disp: , Rfl:    MOUNJARO 7.5 MG/0.5ML Pen, Inject 7.5 mg into the skin once a week., Disp: , Rfl:    naloxegol oxalate (MOVANTIK) 25 MG TABS tablet, Take 1 tablet (25 mg total) by mouth daily., Disp: 30 tablet, Rfl: 1   nystatin ointment (MYCOSTATIN), nystatin 100,000 unit/gram topical ointment   APPLY TO AFFECTED AREA TWICE A DAY TO RASH, Disp: , Rfl:    ondansetron  (ZOFRAN ) 4 MG tablet, TAKE 1 TABLET BY MOUTH EVERY 8 HOURS AS NEEDED FOR NAUSEA AND VOMITING, Disp: 20 tablet, Rfl: 0   oxyCODONE -acetaminophen  (PERCOCET) 10-325 MG per tablet, Take 0.5-1 tablets by mouth every 4 (four) hours as needed for pain., Disp: , Rfl:    polyethylene glycol-electrolytes (NULYTELY ) 420 g solution, Take 4,000 mLs by mouth once., Disp: , Rfl:    potassium chloride  (KLOR-CON ) 10 MEQ tablet, TAKE 6 TABLETS BY MOUTH 2 (TWO) TIMES DAILY FOR 3 DAYS, THEN 6 TABLETS (60 MEQ TOTAL) DAILY., Disp: 180 tablet, Rfl: 3   pregabalin  (LYRICA ) 150 MG capsule, Take 150 mg by mouth 3 (three) times daily., Disp: , Rfl:    promethazine  (PHENERGAN ) 25 MG tablet, promethazine  25 mg tablet  TAKE 1/2 TO 1 TABLET BY MOUTH EVERY 6 HOURS AS NEEDED FOR NAUSEA, Disp: , Rfl:    rosuvastatin  (CRESTOR ) 10 MG tablet, Take 1 tablet (10 mg total) by mouth daily., Disp: 90 tablet, Rfl: 3   rosuvastatin  (CRESTOR ) 5 MG tablet, Take 5 mg by mouth daily., Disp: , Rfl:    sacubitril -valsartan  (ENTRESTO ) 24-26 MG, Take 1 tablet by mouth 2 (two) times daily., Disp: 60 tablet, Rfl: 11   solifenacin (VESICARE) 5 MG tablet, solifenacin 5 mg tablet  TAKE 1 (ONE) TABLET DAILY FOR BLADDER CONTROL, Disp: , Rfl:    spironolactone  (ALDACTONE ) 25 MG tablet, TAKE 1 TABLET (25 MG TOTAL) BY MOUTH DAILY., Disp: 30 tablet, Rfl: 0   torsemide  (DEMADEX ) 20 MG tablet, TAKE 2 TABLETS BY MOUTH 2 (TWO) TIMES DAILY FOR 3 DAYS, THEN 1 TABLET (20 MG TOTAL) 2 TIMES DAILY., Disp: 180 tablet, Rfl: 3   VALIUM  5 MG tablet, 1 PO 60  minutes prior to procedure with 2nd dose 15-20 min prior if needed, Disp: , Rfl:    Vital signs in last 24 hrs: Vitals:   03/21/24 1032  BP: 108/72  Pulse: 86   Wt Readings from Last 3 Encounters:  03/21/24 159 lb 4 oz (72.2 kg)  01/18/24 169 lb (76.7 kg)  12/01/23 169 lb (76.7 kg)    Physical Exam  Well-appearing, gets on exam table  without assistance Cardiac: Regular without appreciable murmur,  no peripheral edema Pulm: clear to auscultation bilaterally, normal RR and effort noted Abdomen: soft, no focal tenderness, with active bowel sounds. No guarding or palpable hepatosplenomegaly.   Labs:     Latest Ref Rng & Units 06/17/2023   10:27 AM 04/12/2023    3:38 PM 08/20/2022    8:34 AM  CMP  Glucose 70 - 99 mg/dL 818  889  864   BUN 6 - 20 mg/dL 19  22  17    Creatinine 0.44 - 1.00 mg/dL 8.97  9.02  8.93   Sodium 135 - 145 mmol/L 142  143  143   Potassium 3.5 - 5.1 mmol/L 3.5  3.4  3.6   Chloride 98 - 111 mmol/L 113  113  112   CO2 22 - 32 mmol/L 21  19  22    Calcium  8.9 - 10.3 mg/dL 8.9  8.4  8.6       Latest Ref Rng & Units 03/12/2021    8:09 AM 01/03/2021    8:15 AM 11/29/2020    1:11 PM  CBC  WBC 4.0 - 10.5 K/uL 7.6  5.6  8.9   Hemoglobin 12.0 - 15.0 g/dL 86.8  87.0  86.1   Hematocrit 36.0 - 46.0 % 40.0  39.4  42.4   Platelets 150 - 400 K/uL 211  245  234     ___________________________________________ Radiologic studies:   ____________________________________________ Other:   _____________________________________________   Encounter Diagnosis  Name Primary?   Chronic constipation Yes   Constipation appears to be a motility issue and likely multifactorial from chronic use of opioid analgesic and GLP-1 agonist.  She also has a redundant colonic anatomy. Assessment and Plan Assessment & Plan Chronic constipation due to opioid and medication use Chronic constipation likely exacerbated by opioid use and possibly by Mounjaro. Linzess ineffective; Amitiza  ineffective. Goal: improve bowel regularity for colonoscopy preparation. - Discontinue Linzess. - Prescribe Movantik 25 mg once daily. - Monitor for abdominal pain, diarrhea, or opioid withdrawal. - Follow up with PA 4 to 6 weeks to assess bowel regulation and adjust medication. - Reschedule colonoscopy with different preparation once bowel  regulation is achieved. (If we can get better control of her constipation, then a bowel prep for procedure would be over 2 days with the Suprep followed by GoLytely .) She told me today that in fact she did not take the morning dose of GoLytely  because her alarm did not go off. Even if she had completed that prep, it would not of insufficient based on what I saw during the procedure.   [name]

## 2024-03-21 NOTE — Patient Instructions (Addendum)
 We have sent the following medications to your pharmacy for you to pick up at your convenience: Movantik once daily  Stop Linzess.  _______________________________________________________  If your blood pressure at your visit was 140/90 or greater, please contact your primary care physician to follow up on this.  _______________________________________________________  If you are age 53 or older, your body mass index should be between 23-30. Your Body mass index is 26.5 kg/m. If this is out of the aforementioned range listed, please consider follow up with your Primary Care Provider.  If you are age 84 or younger, your body mass index should be between 19-25. Your Body mass index is 26.5 kg/m. If this is out of the aformentioned range listed, please consider follow up with your Primary Care Provider.   ________________________________________________________  The [facility] providers would like to encourage you to use MYCHART to communicate with providers for non-urgent requests or questions.  Due to long hold times on the telephone, sending your provider a message by [facility] may be a faster and more efficient way to get a response.  Please allow 48 business hours for a response.  Please remember that this is for non-urgent requests.  _______________________________________________________  [facility] is using a team-based approach to care.  Your team is made up of your doctor and two to three APPS. Our APPS (Nurse Practitioners and Physician Assistants) work with your physician to ensure care continuity for you. They are fully qualified to address your health concerns and develop a treatment plan. They communicate directly with your gastroenterologist to care for you. Seeing the Advanced Practice Practitioners on your physician's team can help you by facilitating care more promptly, often allowing for earlier appointments, access to diagnostic testing, procedures, and other  specialty referrals.    Thank you for trusting me with your gastrointestinal care!    Dr. Victory Legrand DOUGLAS [facility]

## 2024-04-03 ENCOUNTER — Other Ambulatory Visit (HOSPITAL_COMMUNITY): Payer: Self-pay | Admitting: Cardiology

## 2024-04-05 DIAGNOSIS — E1121 Type 2 diabetes mellitus with diabetic nephropathy: Secondary | ICD-10-CM | POA: Diagnosis not present

## 2024-04-05 DIAGNOSIS — M549 Dorsalgia, unspecified: Secondary | ICD-10-CM | POA: Diagnosis not present

## 2024-04-05 DIAGNOSIS — T402X5A Adverse effect of other opioids, initial encounter: Secondary | ICD-10-CM | POA: Diagnosis not present

## 2024-04-05 DIAGNOSIS — N182 Chronic kidney disease, stage 2 (mild): Secondary | ICD-10-CM | POA: Diagnosis not present

## 2024-04-05 DIAGNOSIS — I10 Essential (primary) hypertension: Secondary | ICD-10-CM | POA: Diagnosis not present

## 2024-04-05 DIAGNOSIS — G8929 Other chronic pain: Secondary | ICD-10-CM | POA: Diagnosis not present

## 2024-04-05 DIAGNOSIS — E785 Hyperlipidemia, unspecified: Secondary | ICD-10-CM | POA: Diagnosis not present

## 2024-04-05 DIAGNOSIS — K5903 Drug induced constipation: Secondary | ICD-10-CM | POA: Diagnosis not present

## 2024-04-05 DIAGNOSIS — I509 Heart failure, unspecified: Secondary | ICD-10-CM | POA: Diagnosis not present

## 2024-04-05 DIAGNOSIS — D509 Iron deficiency anemia, unspecified: Secondary | ICD-10-CM | POA: Diagnosis not present

## 2024-04-06 LAB — LAB REPORT - SCANNED: EGFR: 60

## 2024-04-19 ENCOUNTER — Ambulatory Visit (HOSPITAL_COMMUNITY): Admission: RE | Admit: 2024-04-19 | Source: Ambulatory Visit

## 2024-04-19 ENCOUNTER — Encounter (HOSPITAL_COMMUNITY): Admitting: Cardiology

## 2024-05-02 NOTE — Progress Notes (Deleted)
 Chief Complaint: Follow-up constipation  HPI:    [name] is a 53 year old African-American female with a past medical history as listed below including anxiety, GERD, OSA and PVD, known to Dr. Legrand, who returns to clinic today for follow-up of constipation.    03/21/2024 patient seen in clinic by Dr. Legrand for chronic constipation.  Discussed abdominal pain in her lower abdomen associated with fibromyalgia.  Also history of gastric bypass surgery with prior NSAID use.  Initially used Amitiza  which eventually became effective and began Linzess.  Discussed some difficulty swallowing.  At that time discussed an adequate bowel prep for colonoscopy.  On Linzess with loose watery stools.  Also on Lyrica  and oxycodone  for fibromyalgia.  Also Mounjaro.  Discussed all of these medicines could be adding to constipation.  At that time prescribe Movantik 25 mg once a day.  Reschedule colonoscopy with different prep once bowel regulation achieved.  Recommended over 2 days with Suprep followed by GoLytely .  Previous GI history: 01/18/2024: EGD -normal GBP anatomy, no stricture, scope advanced to the jejunum.  Food debris in gastric pouch Colonoscopy to the ascending colon, aborted due to very poor prep with thick stool throughout the colon.  Past Medical History:  Diagnosis Date   Anemia    hx of iron transfusions - last one 10/2013    Anxiety    Arthritis    neck and shoulders    Asthma    Back pain, chronic    Bipolar disorder (HCC)    Carpal tunnel syndrome on both sides    Cataract    Depression    Diabetes mellitus    Fibromyalgia    GERD (gastroesophageal reflux disease)    History of gastric bypass Jan 2013   Hyperlipidemia    Hypertension    Insomnia    Migraines    Nervous breakdown sept 2013   Neuropathy    Nonischemic cardiomyopathy (HCC)    OSA (obstructive sleep apnea) 02/21/2015   Peripheral vascular disease    Shortness of breath    with exertion    Sleep apnea     borderline- no cpap yet   Systolic CHF, acute on chronic (HCC)    CARDIOLOGIST-- DR LEIM MOOSE  ([place])   Wound of left breast     Past Surgical History:  Procedure Laterality Date   BACK SURGERY  04/1991   BREAST CYST ASPIRATION Left    BREAST SURGERY Left    I&D for abcsess    CARDIAC CATHETERIZATION  11-14-2013  DR DALTON MCLEAN   NORMAL LV FILLING PRESSURE W/ MILD ELEVATED RV FILLING, POSSIBLY SUFFESTING RV FAILURE OUT OF PROPORTION TO LV FAILURE. EF ABOUT 35% WITH DIFFUSE HYPOKINESIS. NO ANGIOGRAPHIC CAD, NONISCHEMIC CARDIOMYOPATHY   CARPAL TUNNEL RELEASE Bilateral    CATARACT EXTRACTION Right    CESAREAN SECTION  05/1997   CHOLECYSTECTOMY  1998   DILITATION & CURRETTAGE/HYSTROSCOPY WITH NOVASURE ABLATION N/A 02/26/2014   Procedure: DILATATION & CURETTAGE/HYSTEROSCOPY WITH NOVASURE ABLATION;  Surgeon: Charlie CHRISTELLA Croak, MD;  Location: [facility];  Service: Gynecology;  Laterality: N/A;   ESOPHAGOGASTRODUODENOSCOPY  07/27/2011   Procedure: ESOPHAGOGASTRODUODENOSCOPY (EGD);  Surgeon: Elsie CHRISTELLA Cree, MD;  Location: [facility];  Service: Endoscopy;  Laterality: N/A;   GASTRIC ROUX-EN-Y  06/30/2011   Procedure: LAPAROSCOPIC ROUX-EN-Y GASTRIC;  Surgeon: Morene ONEIDA Olives, MD;  Location: [facility];  Service: General;  Laterality: N/A;      INCISION AND DRAINAGE OF WOUND Left 12/14/2013   Procedure: IRRIGATION AND DEBRIDEMENT OF  LEFT BREAST WOUND WITH PLACEMENT OF A-CELL, WITH CLOSURE ;  Surgeon: Estefana Reichert, DO;  Location: [facility];  Service: Plastics;  Laterality: Left;   IRRIGATION AND DEBRIDEMENT ABSCESS Left 11/26/2013   Procedure: IRRIGATION AND DEBRIDEMENT ABSCESS;  Surgeon: Krystal CHRISTELLA Spinner, MD;  Location: [facility];  Service: General;  Laterality: Left;   LEFT AND RIGHT HEART CATHETERIZATION WITH CORONARY ANGIOGRAM N/A 11/14/2013   Procedure: LEFT AND RIGHT HEART CATHETERIZATION WITH CORONARY ANGIOGRAM;  Surgeon: Ezra GORMAN Shuck, MD;  Location: [facility];  Service:  Cardiovascular;  Laterality: N/A;   SPINAL CORD STIMULATOR INSERTION N/A 03/09/2014   Procedure: LUMBAR SPINAL CORD STIMULATOR INSERTION;  Surgeon: Deward JAYSON Fabian, MD;  Location: [facility];  Service: Neurosurgery;  Laterality: N/A;   TUBAL LIGATION  06/1997    Current Outpatient Medications  Medication Sig Dispense Refill   ACCU-CHEK GUIDE test strip 1 each by Other route 3 (three) times daily.     albuterol  (VENTOLIN  HFA) 108 (90 Base) MCG/ACT inhaler Inhale 2 puffs into the lungs every 4 (four) hours as needed for wheezing or shortness of breath.     Biotin  5000 MCG TABS Take 5,000 mcg by mouth daily.     Cholecalciferol (VITAMIN D3) 5000 units TABS Take 5,000 Units by mouth daily.      cloNIDine (CATAPRES) 0.1 MG tablet Take 0.1 mg by mouth 2 (two) times daily.     cyclobenzaprine  (FLEXERIL ) 10 MG tablet Take 10 mg by mouth 3 (three) times daily as needed for muscle spasms.      diclofenac  sodium (VOLTAREN ) 1 % GEL Apply 2 g topically 2 (two) times daily.     DULoxetine  (CYMBALTA ) 30 MG capsule Take 30 mg by mouth daily.     DULoxetine  (CYMBALTA ) 60 MG capsule Take 60 mg by mouth daily.     famotidine  (PEPCID ) 20 MG tablet Take 20 mg by mouth daily.     fluticasone  (FLONASE ) 50 MCG/ACT nasal spray Place 1 spray into both nostrils daily.      ibuprofen  (ADVIL ,MOTRIN ) 800 MG tablet Take 800 mg by mouth every 8 (eight) hours as needed (pain).     JARDIANCE 10 MG TABS tablet Take 10 mg by mouth daily.     JARDIANCE 25 MG TABS tablet Take 25 mg by mouth every morning.     lidocaine  4 % Place 1 patch onto the skin daily. 15 patch 0   metoCLOPramide  (REGLAN ) 5 MG tablet Take 1 tablet (5 mg total) by mouth every 12 (twelve) hours as needed for up to 2 doses for nausea. Take 30-45 minutes before evening and AM doses of bowel preparation solution. 2 tablet 0   metoprolol  succinate (TOPROL -XL) 25 MG 24 hr tablet Take 25 mg by mouth daily.     metoprolol  succinate (TOPROL -XL) 50 MG 24 hr tablet  TAKE 1 TABLET BY MOUTH EVERY DAY 90 tablet 3   montelukast  (SINGULAIR ) 10 MG tablet Take 10 mg by mouth daily.     MOUNJARO 15 MG/0.5ML Pen SMARTSIG:15 Milligram(s) SUB-Q Once a Week     MOUNJARO 7.5 MG/0.5ML Pen Inject 7.5 mg into the skin once a week.     naloxegol oxalate (MOVANTIK) 25 MG TABS tablet Take 1 tablet (25 mg total) by mouth daily. 30 tablet 1   nystatin ointment (MYCOSTATIN) nystatin 100,000 unit/gram topical ointment  APPLY TO AFFECTED AREA TWICE A DAY TO RASH     ondansetron  (ZOFRAN ) 4 MG tablet TAKE 1 TABLET BY MOUTH EVERY 8  HOURS AS NEEDED FOR NAUSEA AND VOMITING 20 tablet 0   oxyCODONE -acetaminophen  (PERCOCET) 10-325 MG per tablet Take 0.5-1 tablets by mouth every 4 (four) hours as needed for pain.     polyethylene glycol-electrolytes (NULYTELY ) 420 g solution Take 4,000 mLs by mouth once.     potassium chloride  (KLOR-CON ) 10 MEQ tablet TAKE 6 TABLETS BY MOUTH 2 (TWO) TIMES DAILY FOR 3 DAYS, THEN 6 TABLETS (60 MEQ TOTAL) DAILY. 180 tablet 3   pregabalin  (LYRICA ) 150 MG capsule Take 150 mg by mouth 3 (three) times daily.     promethazine  (PHENERGAN ) 25 MG tablet promethazine  25 mg tablet  TAKE 1/2 TO 1 TABLET BY MOUTH EVERY 6 HOURS AS NEEDED FOR NAUSEA     rosuvastatin  (CRESTOR ) 10 MG tablet Take 1 tablet (10 mg total) by mouth daily. 90 tablet 3   rosuvastatin  (CRESTOR ) 5 MG tablet Take 5 mg by mouth daily.     sacubitril -valsartan  (ENTRESTO ) 24-26 MG Take 1 tablet by mouth 2 (two) times daily. 60 tablet 11   solifenacin (VESICARE) 5 MG tablet solifenacin 5 mg tablet  TAKE 1 (ONE) TABLET DAILY FOR BLADDER CONTROL     spironolactone  (ALDACTONE ) 25 MG tablet TAKE 1 TABLET (25 MG TOTAL) BY MOUTH DAILY. 90 tablet 1   torsemide  (DEMADEX ) 20 MG tablet TAKE 2 TABLETS BY MOUTH 2 (TWO) TIMES DAILY FOR 3 DAYS, THEN 1 TABLET (20 MG TOTAL) 2 TIMES DAILY. 180 tablet 3   VALIUM  5 MG tablet 1 PO 60  minutes prior to procedure with 2nd dose 15-20 min prior if needed     No current  facility-administered medications for this visit.    Allergies as of 05/03/2024 - Review Complete 03/21/2024  Allergen Reaction Noted   Imitrex  [sumatriptan ] Other (See Comments) 02/17/2012   Lamictal [lamotrigine] Nausea And Vomiting 08/07/2019   Metformin and related Diarrhea 12/23/2020    Family History  Problem Relation Age of Onset   Hypertension Mother    Heart disease Sister    Hypertension Brother    Heart disease Brother    Diabetes Maternal Aunt    Diabetes Paternal Aunt    Diabetes Paternal Grandmother    Diabetes Paternal Grandfather    Hypertension Paternal Grandfather    Heart attack Neg Hx    Stroke Neg Hx    Colon cancer Neg Hx    Colon polyps Neg Hx    Esophageal cancer Neg Hx    Rectal cancer Neg Hx    Stomach cancer Neg Hx    Breast cancer Neg Hx     Social History   Socioeconomic History   Marital status: Single    Spouse name: Not on file   Number of children: Not on file   Years of education: Not on file   Highest education level: Not on file  Occupational History   Not on file  Tobacco Use   Smoking status: Never   Smokeless tobacco: Never  Vaping Use   Vaping status: Never Used  Substance and Sexual Activity   Alcohol use: No   Drug use: No   Sexual activity: Never  Other Topics Concern   Not on file  Social History Narrative   ** Merged History Encounter **       Social Drivers of Corporate Investment Banker Strain: Not on file  Food Insecurity: Not on file  Transportation Needs: Not on file  Physical Activity: Not on file  Stress: Not on file  Social Connections: Not  on file  Intimate Partner Violence: Not on file    Review of Systems:    Constitutional: No weight loss, fever, chills, weakness or fatigue HEENT: Eyes: No change in vision               Ears, Nose, Throat:  No change in hearing or congestion Skin: No rash or itching Cardiovascular: No chest pain, chest pressure or palpitations   Respiratory: No SOB or  cough Gastrointestinal: See HPI and otherwise negative Genitourinary: No dysuria or change in urinary frequency Neurological: No headache, dizziness or syncope Musculoskeletal: No new muscle or joint pain Hematologic: No bleeding or bruising Psychiatric: No history of depression or anxiety    Physical Exam:  Vital signs: There were no vitals taken for this visit.  Constitutional:   Pleasant Caucasian female appears to be in NAD, Well developed, Well nourished, alert and cooperative Head:  Normocephalic and atraumatic. Eyes:   PEERL, EOMI. No icterus. Conjunctiva pink. Ears:  Normal auditory acuity. Neck:  Supple Throat: Oral cavity and pharynx without inflammation, swelling or lesion.  Respiratory: Respirations even and unlabored. Lungs clear to auscultation bilaterally.   No wheezes, crackles, or rhonchi.  Cardiovascular: Normal S1, S2. No MRG. Regular rate and rhythm. No peripheral edema, cyanosis or pallor.  Gastrointestinal:  Soft, nondistended, nontender. No rebound or guarding. Normal bowel sounds. No appreciable masses or hepatomegaly. Rectal:  Not performed.  Msk:  Symmetrical without gross deformities. Without edema, no deformity or joint abnormality.  Neurologic:  Alert and  oriented x4;  grossly normal neurologically.  Skin:   Dry and intact without significant lesions or rashes. Psychiatric: Oriented to person, place and time. Demonstrates good judgement and reason without abnormal affect or behaviors.  RELEVANT LABS AND IMAGING: CBC    Component Value Date/Time   WBC 7.6 03/12/2021 0809   RBC 4.37 03/12/2021 0809   HGB 13.1 03/12/2021 0809   HGB 11.5 (L) 03/24/2017 0842   HCT 40.0 03/12/2021 0809   HCT 34.5 (L) 03/24/2017 0842   PLT 211 03/12/2021 0809   PLT 235 03/24/2017 0842   MCV 91.5 03/12/2021 0809   MCV 90.1 03/24/2017 0842   MCH 30.0 03/12/2021 0809   MCHC 32.8 03/12/2021 0809   RDW 13.1 03/12/2021 0809   RDW 12.9 03/24/2017 0842   LYMPHSABS 1.9  03/12/2021 0809   LYMPHSABS 1.8 03/24/2017 0842   MONOABS 0.6 03/12/2021 0809   MONOABS 0.7 03/24/2017 0842   EOSABS 0.4 03/12/2021 0809   EOSABS 0.2 03/24/2017 0842   BASOSABS 0.1 03/12/2021 0809   BASOSABS 0.0 03/24/2017 0842    CMP     Component Value Date/Time   NA 142 06/17/2023 1027   NA 143 11/25/2017 1027   NA 144 01/07/2016 1225   K 3.5 06/17/2023 1027   K 3.0 (LL) 01/07/2016 1225   CL 113 (H) 06/17/2023 1027   CO2 21 (L) 06/17/2023 1027   CO2 27 01/07/2016 1225   GLUCOSE 181 (H) 06/17/2023 1027   GLUCOSE 60 (L) 01/07/2016 1225   BUN 19 06/17/2023 1027   BUN 13 11/25/2017 1027   BUN 16.2 01/07/2016 1225   CREATININE 1.02 (H) 06/17/2023 1027   CREATININE 0.9 01/07/2016 1225   CALCIUM  8.9 06/17/2023 1027   CALCIUM  9.1 01/07/2016 1225   PROT 6.1 (L) 05/21/2022 1008   PROT 7.2 01/07/2016 1225   ALBUMIN 3.1 (L) 05/21/2022 1008   ALBUMIN 3.4 (L) 01/07/2016 1225   AST 24 05/21/2022 1008   AST 90 (H)  01/07/2016 1225   ALT 28 05/21/2022 1008   ALT 99 (H) 01/07/2016 1225   ALKPHOS 140 (H) 05/21/2022 1008   ALKPHOS 251 (H) 01/07/2016 1225   BILITOT 0.4 05/21/2022 1008   BILITOT 0.52 01/07/2016 1225   GFRNONAA >60 06/17/2023 1027   GFRAA >60 08/07/2019 0942    Assessment: 1.  Chronic constipation:  Plan: 1. ***     Delon Failing, PA-C [facility] 05/02/2024, 10:46 AM  Cc: Stephanie Charlene CROME, MD

## 2024-05-03 ENCOUNTER — Ambulatory Visit: Admitting: Physician Assistant

## 2024-05-22 ENCOUNTER — Other Ambulatory Visit (HOSPITAL_COMMUNITY): Payer: Self-pay | Admitting: Family Medicine

## 2024-05-25 ENCOUNTER — Ambulatory Visit (HOSPITAL_COMMUNITY)

## 2024-05-25 ENCOUNTER — Other Ambulatory Visit (HOSPITAL_COMMUNITY)

## 2024-06-02 ENCOUNTER — Telehealth (HOSPITAL_COMMUNITY): Payer: Self-pay

## 2024-06-02 NOTE — Telephone Encounter (Signed)
 Called to confirm/remind patient of their appointment at [facility] on 06/05/24.   Appointment:   [] Confirmed  [x] Left mess   [] No answer/No voice mail  [] VM Full/unable to leave message  [] Phone not in service   And to bring in all medications and/or complete list.

## 2024-06-02 NOTE — Telephone Encounter (Signed)
 ERROR
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
## 2024-06-05 ENCOUNTER — Ambulatory Visit (HOSPITAL_COMMUNITY)
Admission: RE | Admit: 2024-06-05 | Discharge: 2024-06-05 | Disposition: A | Discharge status: Home / Self Care | Source: Ambulatory Visit | Attending: Adult Health | Admitting: Adult Health

## 2024-06-05 ENCOUNTER — Encounter (HOSPITAL_COMMUNITY): Payer: Self-pay

## 2024-06-05 ENCOUNTER — Ambulatory Visit (HOSPITAL_BASED_OUTPATIENT_CLINIC_OR_DEPARTMENT_OTHER)
Admission: RE | Admit: 2024-06-05 | Discharge: 2024-06-05 | Disposition: A | Discharge status: Home / Self Care | Source: Ambulatory Visit | Attending: Internal Medicine | Admitting: Internal Medicine

## 2024-06-05 VITALS — BP 120/68 | HR 82 | Ht 65.0 in | Wt 158.2 lb

## 2024-06-05 DIAGNOSIS — I2510 Atherosclerotic heart disease of native coronary artery without angina pectoris: Secondary | ICD-10-CM | POA: Diagnosis not present

## 2024-06-05 DIAGNOSIS — I5022 Chronic systolic (congestive) heart failure: Secondary | ICD-10-CM | POA: Insufficient documentation

## 2024-06-05 DIAGNOSIS — E663 Overweight: Secondary | ICD-10-CM | POA: Insufficient documentation

## 2024-06-05 DIAGNOSIS — I110 Hypertensive heart disease with heart failure: Secondary | ICD-10-CM | POA: Diagnosis not present

## 2024-06-05 DIAGNOSIS — G4733 Obstructive sleep apnea (adult) (pediatric): Secondary | ICD-10-CM | POA: Diagnosis not present

## 2024-06-05 DIAGNOSIS — I428 Other cardiomyopathies: Secondary | ICD-10-CM | POA: Insufficient documentation

## 2024-06-05 DIAGNOSIS — I071 Rheumatic tricuspid insufficiency: Secondary | ICD-10-CM | POA: Insufficient documentation

## 2024-06-05 DIAGNOSIS — Z6826 Body mass index (BMI) 26.0-26.9, adult: Secondary | ICD-10-CM | POA: Insufficient documentation

## 2024-06-05 DIAGNOSIS — Z79899 Other long term (current) drug therapy: Secondary | ICD-10-CM | POA: Insufficient documentation

## 2024-06-05 MED ORDER — METOPROLOL SUCCINATE ER 50 MG PO TB24: 50.0000 mg | ORAL_TABLET | Freq: Every day | ORAL | Status: DC

## 2024-06-05 NOTE — Patient Instructions (Signed)
 Medication Changes:  PLEASE CALL US  TODAY TO REVIEW YOUR MEDICATIONS, AND CONFIRM DOSAGES THE NUMBER TO THE OFFICE IS [phone] OPT 2   Follow-Up in: 6 MONTHS PLEASE CALL OUR OFFICE AROUND APRIL 2026 TO GET SCHEDULED FOR YOUR APPOINTMENT. PHONE NUMBER IS [phone] OPTION 2   At the [facility], you and your health needs are our priority. We have a designated team specialized in the treatment of Heart Failure. This Care Team includes your primary Heart Failure Specialized Cardiologist (physician), Advanced Practice Providers (APPs- Physician Assistants and Nurse Practitioners), and Pharmacist who all work together to provide you with the care you need, when you need it.   You may see any of the following providers on your designated Care Team at your next follow up:  Dr. Toribio Fuel Dr. Ezra Shuck Dr. Odis Brownie Greig Mosses, NP Caffie Shed, [place] [facility] [place], [place] Beckey Coe, NP Jordan Lee, NP Tinnie Redman, PharmD   Please be sure to bring in all your medications bottles to every appointment.   Need to Contact Us :  If you have any questions or concerns before your next appointment please send us  a message through [place] or call our office at [phone].    TO LEAVE A MESSAGE FOR THE NURSE SELECT OPTION 2, PLEASE LEAVE A MESSAGE INCLUDING: YOUR NAME DATE OF BIRTH CALL BACK NUMBER REASON FOR CALL**this is important as we prioritize the call backs  YOU WILL RECEIVE A CALL BACK THE SAME DAY AS LONG AS YOU CALL BEFORE 4:00 PM

## 2024-06-05 NOTE — Progress Notes (Signed)
" °  Echocardiogram 2D Echocardiogram has been performed.  Koleen KANDICE Popper, RDCS 06/05/2024, 8:55 AM "

## 2024-06-07 ENCOUNTER — Ambulatory Visit (HOSPITAL_COMMUNITY): Payer: Self-pay | Admitting: Cardiology

## 2024-06-07 LAB — ECHOCARDIOGRAM COMPLETE
Area-P 1/2: 3.89 cm2
Calc EF: 55.7 %
S' Lateral: 2.7 cm
Single Plane A2C EF: 56.9 %
Single Plane A4C EF: 56 %

## 2024-06-13 ENCOUNTER — Other Ambulatory Visit: Payer: Self-pay

## 2024-06-13 ENCOUNTER — Observation Stay (HOSPITAL_COMMUNITY)
Admission: EM | Admit: 2024-06-13 | Discharge: 2024-06-18 | Disposition: A | Discharge status: Home / Self Care | Attending: Internal Medicine | Admitting: Internal Medicine

## 2024-06-13 ENCOUNTER — Emergency Department (HOSPITAL_COMMUNITY)

## 2024-06-13 ENCOUNTER — Encounter (HOSPITAL_COMMUNITY): Payer: Self-pay

## 2024-06-13 DIAGNOSIS — K5903 Drug induced constipation: Secondary | ICD-10-CM | POA: Insufficient documentation

## 2024-06-13 DIAGNOSIS — Z79899 Other long term (current) drug therapy: Secondary | ICD-10-CM | POA: Diagnosis not present

## 2024-06-13 DIAGNOSIS — N179 Acute kidney failure, unspecified: Secondary | ICD-10-CM | POA: Insufficient documentation

## 2024-06-13 DIAGNOSIS — I110 Hypertensive heart disease with heart failure: Secondary | ICD-10-CM | POA: Diagnosis not present

## 2024-06-13 DIAGNOSIS — R7989 Other specified abnormal findings of blood chemistry: Secondary | ICD-10-CM | POA: Insufficient documentation

## 2024-06-13 DIAGNOSIS — J479 Bronchiectasis, uncomplicated: Secondary | ICD-10-CM | POA: Diagnosis not present

## 2024-06-13 DIAGNOSIS — R748 Abnormal levels of other serum enzymes: Secondary | ICD-10-CM | POA: Diagnosis not present

## 2024-06-13 DIAGNOSIS — G8929 Other chronic pain: Secondary | ICD-10-CM | POA: Insufficient documentation

## 2024-06-13 DIAGNOSIS — R079 Chest pain, unspecified: Principal | ICD-10-CM | POA: Diagnosis present

## 2024-06-13 DIAGNOSIS — I5022 Chronic systolic (congestive) heart failure: Secondary | ICD-10-CM | POA: Diagnosis not present

## 2024-06-13 DIAGNOSIS — Z794 Long term (current) use of insulin: Secondary | ICD-10-CM | POA: Insufficient documentation

## 2024-06-13 DIAGNOSIS — I959 Hypotension, unspecified: Secondary | ICD-10-CM | POA: Diagnosis not present

## 2024-06-13 DIAGNOSIS — E1165 Type 2 diabetes mellitus with hyperglycemia: Secondary | ICD-10-CM | POA: Diagnosis not present

## 2024-06-13 DIAGNOSIS — K432 Incisional hernia without obstruction or gangrene: Secondary | ICD-10-CM | POA: Diagnosis not present

## 2024-06-13 LAB — CBC WITH DIFFERENTIAL/PLATELET
Abs Immature Granulocytes: 0.02 K/uL (ref 0.00–0.07)
Basophils Absolute: 0 K/uL (ref 0.0–0.1)
Basophils Relative: 1 %
Eosinophils Absolute: 0.3 K/uL (ref 0.0–0.5)
Eosinophils Relative: 4 %
HCT: 39.1 % (ref 36.0–46.0)
Hemoglobin: 12.5 g/dL (ref 12.0–15.0)
Immature Granulocytes: 0 %
Lymphocytes Relative: 37 %
Lymphs Abs: 2.7 K/uL (ref 0.7–4.0)
MCH: 29.9 pg (ref 26.0–34.0)
MCHC: 32 g/dL (ref 30.0–36.0)
MCV: 93.5 fL (ref 80.0–100.0)
Monocytes Absolute: 0.6 K/uL (ref 0.1–1.0)
Monocytes Relative: 9 %
Neutro Abs: 3.5 K/uL (ref 1.7–7.7)
Neutrophils Relative %: 49 %
Platelets: 203 K/uL (ref 150–400)
RBC: 4.18 MIL/uL (ref 3.87–5.11)
RDW: 12.2 % (ref 11.5–15.5)
WBC: 7.2 K/uL (ref 4.0–10.5)
nRBC: 0 % (ref 0.0–0.2)

## 2024-06-13 LAB — COMPREHENSIVE METABOLIC PANEL WITH GFR
ALT: 71 U/L — ABNORMAL HIGH (ref 0–44)
AST: 85 U/L — ABNORMAL HIGH (ref 15–41)
Albumin: 3.8 g/dL (ref 3.5–5.0)
Alkaline Phosphatase: 178 U/L — ABNORMAL HIGH (ref 38–126)
Anion gap: 10 (ref 5–15)
BUN: 22 mg/dL — ABNORMAL HIGH (ref 6–20)
CO2: 26 mmol/L (ref 22–32)
Calcium: 8.9 mg/dL (ref 8.9–10.3)
Chloride: 103 mmol/L (ref 98–111)
Creatinine, Ser: 1.1 mg/dL — ABNORMAL HIGH (ref 0.44–1.00)
GFR, Estimated: 60 mL/min — ABNORMAL LOW
Glucose, Bld: 155 mg/dL — ABNORMAL HIGH (ref 70–99)
Potassium: 3.8 mmol/L (ref 3.5–5.1)
Sodium: 139 mmol/L (ref 135–145)
Total Bilirubin: 0.4 mg/dL (ref 0.0–1.2)
Total Protein: 6.7 g/dL (ref 6.5–8.1)

## 2024-06-13 LAB — TROPONIN T, HIGH SENSITIVITY
Troponin T High Sensitivity: 27 ng/L — ABNORMAL HIGH (ref 0–19)
Troponin T High Sensitivity: 24 ng/L — ABNORMAL HIGH (ref 0–19)

## 2024-06-13 LAB — PRO BRAIN NATRIURETIC PEPTIDE: Pro Brain Natriuretic Peptide: <50 pg/mL

## 2024-06-13 MED ORDER — MORPHINE SULFATE (PF) 4 MG/ML IV SOLN
4.0000 mg | Freq: Once | INTRAVENOUS | Status: AC
  Administered 2024-06-14: 4 mg via INTRAVENOUS
  Filled 2024-06-13: qty 1

## 2024-06-13 MED ORDER — ASPIRIN 81 MG PO CHEW
324.0000 mg | CHEWABLE_TABLET | Freq: Once | ORAL | Status: AC
  Administered 2024-06-14: 324 mg via ORAL
  Filled 2024-06-13: qty 4

## 2024-06-13 NOTE — ED Provider Triage Note (Signed)
 Emergency Medicine Provider Triage Evaluation Note  [name] , a 53 y.o. female  was evaluated in triage.  Pt complains of central chest pain since this AM. She reports that she woke with chest pain/pressure this morning.  It is more of a soreness but then started become more of a heaviness and pressure in her central chest.  Has mild shortness of breath.  Denies any flulike symptoms or any recent cough.  Reports that she has not had any leg swelling or belly swelling.  Is at her dry weight.  Denies any other radiation of her pain.  Review of Systems  Positive:  Negative:   Physical Exam  BP 99/65 (BP Location: Right Arm)   Pulse 97   Temp 97.8 F (36.6 C)   Resp 16   SpO2 99%  Gen:   Awake, no distress   Resp:  Normal effort  MSK:   Moves extremities without difficulty  Other:  Lungs are clear to auscultation bilaterally.  Patient does not appear in any acute distress.  Medical Decision Making  Medically screening exam initiated at 6:07 PM.  Appropriate orders placed.  [name] was informed that the remainder of the evaluation will be completed by another provider, this initial triage assessment does not replace that evaluation, and the importance of remaining in the ED until their evaluation is complete.  Cardiac workup initiated   [name], [name] 06/13/24 1810

## 2024-06-13 NOTE — ED Notes (Signed)
 Pt gives verbal consent for mse

## 2024-06-13 NOTE — ED Provider Notes (Signed)
 " [facility] Emergency Department Provider Note MRN:  [id number]  Arrival date & time: 06/14/2024     Chief Complaint   Chest Pain   History of Present Illness   [name] is a 53 y.o. year-old female presents to the ED with chief complaint of chest pressure that started this morning.  She reports some mild associated shortness of breath.  She does have history of nonischemic cardiomyopathy.  She states that she has been taking her fluid pill and does not feel like she has any edema.  She denies fever, chills, or cough.  She states that she still feels a heaviness in her chest.  She does have some radiating symptoms to her jaw and left arm.  She was seen about a week ago by cardiology in clinic and was told to return to the ER if any of her symptoms return..  History provided by patient.   Review of Systems  Pertinent positive and negative review of systems noted in HPI.    Physical Exam   Vitals:   06/14/24 0229 06/14/24 0240  BP: 104/75   Pulse: 85   Resp: 12   Temp:  97.6 F (36.4 C)  SpO2: 98%     CONSTITUTIONAL:  non toxic-appearing, NAD NEURO:  Alert and oriented x 3, CN 3-12 grossly intact EYES:  eyes equal and reactive ENT/NECK:  Supple, no stridor  CARDIO:  normal rate, regular rhythm, appears well-perfused  PULM:  No respiratory distress, CTAB GI/GU:  non-distended,  MSK/SPINE:  No gross deformities, no edema, moves all extremities  SKIN:  no rash, , atraumatic   *Additional and/or pertinent findings included in MDM below  Diagnostic and Interventional Summary    EKG Interpretation Date/Time:    Ventricular Rate:    PR Interval:    QRS Duration:    QT Interval:    QTC Calculation:   R Axis:      Text Interpretation:         Labs Reviewed  COMPREHENSIVE METABOLIC PANEL WITH GFR - Abnormal; Notable for the following components:      Result Value   Glucose, Bld 155 (*)    BUN 22 (*)    Creatinine, Ser 1.10 (*)     AST 85 (*)    ALT 71 (*)    Alkaline Phosphatase 178 (*)    GFR, Estimated 60 (*)    All other components within normal limits  MAGNESIUM  - Abnormal; Notable for the following components:   Magnesium  2.6 (*)    All other components within normal limits  TROPONIN T, HIGH SENSITIVITY - Abnormal; Notable for the following components:   Troponin T High Sensitivity 27 (*)    All other components within normal limits  TROPONIN T, HIGH SENSITIVITY - Abnormal; Notable for the following components:   Troponin T High Sensitivity 24 (*)    All other components within normal limits  CBC WITH DIFFERENTIAL/PLATELET  PRO BRAIN NATRIURETIC PEPTIDE  CBC WITH DIFFERENTIAL/PLATELET  COMPREHENSIVE METABOLIC PANEL WITH GFR  LIPASE, BLOOD    DG Chest 2 View  Final Result      Medications  acetaminophen  (TYLENOL ) tablet 650 mg (has no administration in time range)    Or  acetaminophen  (TYLENOL ) suppository 650 mg (has no administration in time range)  melatonin tablet 3 mg (has no administration in time range)  ondansetron  (ZOFRAN ) injection 4 mg (has no administration in time range)  pantoprazole  (PROTONIX ) injection 40 mg (40 mg Intravenous  Given 06/14/24 0100)  alum & mag hydroxide-simeth (MAALOX/MYLANTA) 200-200-20 MG/5ML suspension 15 mL (has no administration in time range)  naloxone (NARCAN) injection 0.4 mg (has no administration in time range)  fentaNYL  (SUBLIMAZE ) injection 12.5 mcg (has no administration in time range)  aspirin  chewable tablet 324 mg (324 mg Oral Given 06/14/24 0000)  morphine  (PF) 4 MG/ML injection 4 mg (4 mg Intravenous Given 06/14/24 0000)  potassium chloride  SA (KLOR-CON  M) CR tablet 20 mEq (20 mEq Oral Given 06/14/24 0100)     Procedures  /  Critical Care Procedures  ED Course and Medical Decision Making  I have reviewed the triage vital signs, the nursing notes, and pertinent available records from the EMR.  Social Determinants Affecting Complexity of  Care: Patient has no clinically significant social determinants affecting this chief complaint..   ED Course: Clinical Course as of 06/14/24 0252  Wed Jun 14, 2024  0252 Comprehensive metabolic panel(!) [RB]  0252 CBC with Differential [RB]  0252 Troponin T, High Sensitivity(!) [RB]  0252 Pro Brain natriuretic peptide [RB]    Clinical Course User Index [RB] Vicky Charleston, PA-C    Medical Decision Making Patient here with chest pressure that started this morning.  Has radiated to arm and jaw.  Has had mild SOB.  Still having chest pressure now.  Describes symptoms as a heaviness.  Trops are slightly elevated, but flat.  Has had similar symptoms recently and was told by cards to come to the ER if symptoms worsened.   Patient still having chest pressure.  Has elevated by flat trops.  CT coronary from a few year ago showed possible distal LAD lesion.   Discussed with Dr. Haze, who recommends admission for chest pain rule out.   Amount and/or Complexity of Data Reviewed Labs:  Decision-making details documented in ED Course.  Risk OTC drugs. Prescription drug management. Decision regarding hospitalization.         Consultants: I consulted with Hospitalist, Dr. Marcene, who is appreciated for admitting.   Treatment and Plan: Patient's exam and diagnostic results are concerning for chest pain.  Feel that patient will need admission to the hospital for further treatment and evaluation.  Patient discussed with attending physician, Dr. Haze, who agrees with plan.  Final Clinical Impressions(s) / ED Diagnoses     ICD-10-CM   1. Chest pain, unspecified type  R07.9       ED Discharge Orders     None         Discharge Instructions Discussed with and Provided to Patient:   Discharge Instructions   None      Vicky Charleston, PA-C 06/14/24 0253    Haze Lonni PARAS, MD 06/14/24 [id number]  "

## 2024-06-13 NOTE — ED Notes (Signed)
"  Called for pt, no answer  "

## 2024-06-13 NOTE — ED Triage Notes (Signed)
 Quick triage note: Pt to ED c/o chest pain that started this morning with minimal SHOB, Denies aggravating or alleviating factors. HX CHF, Reports taking medication as prescribed.

## 2024-06-14 ENCOUNTER — Observation Stay (HOSPITAL_COMMUNITY)

## 2024-06-14 DIAGNOSIS — R7401 Elevation of levels of liver transaminase levels: Secondary | ICD-10-CM | POA: Diagnosis not present

## 2024-06-14 DIAGNOSIS — R7989 Other specified abnormal findings of blood chemistry: Secondary | ICD-10-CM | POA: Diagnosis not present

## 2024-06-14 DIAGNOSIS — R079 Chest pain, unspecified: Secondary | ICD-10-CM

## 2024-06-14 DIAGNOSIS — K5909 Other constipation: Secondary | ICD-10-CM | POA: Diagnosis not present

## 2024-06-14 LAB — CBC WITH DIFFERENTIAL/PLATELET
Abs Immature Granulocytes: 0.05 K/uL (ref 0.00–0.07)
Basophils Absolute: 0 K/uL (ref 0.0–0.1)
Basophils Relative: 0 %
Eosinophils Absolute: 0.3 K/uL (ref 0.0–0.5)
Eosinophils Relative: 3 %
HCT: 38.4 % (ref 36.0–46.0)
Hemoglobin: 12.8 g/dL (ref 12.0–15.0)
Immature Granulocytes: 1 %
Lymphocytes Relative: 26 %
Lymphs Abs: 2.5 K/uL (ref 0.7–4.0)
MCH: 30.3 pg (ref 26.0–34.0)
MCHC: 33.3 g/dL (ref 30.0–36.0)
MCV: 91 fL (ref 80.0–100.0)
Monocytes Absolute: 0.8 K/uL (ref 0.1–1.0)
Monocytes Relative: 9 %
Neutro Abs: 5.9 K/uL (ref 1.7–7.7)
Neutrophils Relative %: 61 %
Platelets: 186 K/uL (ref 150–400)
RBC: 4.22 MIL/uL (ref 3.87–5.11)
RDW: 12.3 % (ref 11.5–15.5)
WBC: 9.6 K/uL (ref 4.0–10.5)
nRBC: 0 % (ref 0.0–0.2)

## 2024-06-14 LAB — COMPREHENSIVE METABOLIC PANEL WITH GFR
ALT: 252 U/L — ABNORMAL HIGH (ref 0–44)
AST: 859 U/L — ABNORMAL HIGH (ref 15–41)
Albumin: 3.7 g/dL (ref 3.5–5.0)
Alkaline Phosphatase: 326 U/L — ABNORMAL HIGH (ref 38–126)
Anion gap: 11 (ref 5–15)
BUN: 21 mg/dL — ABNORMAL HIGH (ref 6–20)
CO2: 25 mmol/L (ref 22–32)
Calcium: 8.8 mg/dL — ABNORMAL LOW (ref 8.9–10.3)
Chloride: 103 mmol/L (ref 98–111)
Creatinine, Ser: 1.09 mg/dL — ABNORMAL HIGH (ref 0.44–1.00)
GFR, Estimated: >60 mL/min
Glucose, Bld: 118 mg/dL — ABNORMAL HIGH (ref 70–99)
Potassium: 4.1 mmol/L (ref 3.5–5.1)
Sodium: 139 mmol/L (ref 135–145)
Total Bilirubin: 0.8 mg/dL (ref 0.0–1.2)
Total Protein: 6.5 g/dL (ref 6.5–8.1)

## 2024-06-14 LAB — MAGNESIUM: Magnesium: 2.6 mg/dL — ABNORMAL HIGH (ref 1.7–2.4)

## 2024-06-14 LAB — LIPASE, BLOOD: Lipase: 39 U/L (ref 11–51)

## 2024-06-14 MED ORDER — FENTANYL CITRATE (PF) 50 MCG/ML IJ SOSY
12.5000 ug | PREFILLED_SYRINGE | INTRAMUSCULAR | Status: DC | PRN
  Administered 2024-06-14 (×2): 12.5 ug via INTRAVENOUS
  Filled 2024-06-14 (×2): qty 1

## 2024-06-14 MED ORDER — METOPROLOL SUCCINATE ER 25 MG PO TB24
25.0000 mg | ORAL_TABLET | Freq: Every morning | ORAL | Status: DC
  Administered 2024-06-15 – 2024-06-16 (×2): 25 mg via ORAL
  Filled 2024-06-14 (×2): qty 1

## 2024-06-14 MED ORDER — DULOXETINE HCL 60 MG PO CPEP
90.0000 mg | ORAL_CAPSULE | Freq: Every day | ORAL | Status: DC
  Administered 2024-06-14 – 2024-06-18 (×5): 90 mg via ORAL
  Filled 2024-06-14: qty 3
  Filled 2024-06-14: qty 1
  Filled 2024-06-14: qty 3
  Filled 2024-06-14 (×2): qty 1

## 2024-06-14 MED ORDER — SPIRONOLACTONE 25 MG PO TABS
25.0000 mg | ORAL_TABLET | Freq: Every day | ORAL | Status: DC
  Administered 2024-06-14 – 2024-06-15 (×2): 25 mg via ORAL
  Filled 2024-06-14 (×2): qty 1

## 2024-06-14 MED ORDER — ONDANSETRON HCL 4 MG/2ML IJ SOLN: 4.0000 mg | Freq: Four times a day (QID) | INTRAMUSCULAR | Status: DC | PRN

## 2024-06-14 MED ORDER — ACETAMINOPHEN 325 MG PO TABS
650.0000 mg | ORAL_TABLET | Freq: Four times a day (QID) | ORAL | Status: DC | PRN
  Administered 2024-06-14 – 2024-06-15 (×2): 650 mg via ORAL
  Filled 2024-06-14 (×2): qty 2

## 2024-06-14 MED ORDER — NALOXONE HCL 0.4 MG/ML IJ SOLN: 0.4000 mg | INTRAMUSCULAR | Status: DC | PRN

## 2024-06-14 MED ORDER — TRAMADOL HCL 50 MG PO TABS: 50.0000 mg | ORAL_TABLET | Freq: Four times a day (QID) | ORAL | Status: DC | PRN

## 2024-06-14 MED ORDER — PANTOPRAZOLE SODIUM 40 MG PO TBEC
40.0000 mg | DELAYED_RELEASE_TABLET | Freq: Every day | ORAL | Status: DC
  Administered 2024-06-14 – 2024-06-15 (×2): 40 mg via ORAL
  Filled 2024-06-14 (×2): qty 1

## 2024-06-14 MED ORDER — DULOXETINE HCL 30 MG PO CPEP: 60.0000 mg | ORAL_CAPSULE | Freq: Every day | ORAL | Status: DC

## 2024-06-14 MED ORDER — POTASSIUM CHLORIDE CRYS ER 20 MEQ PO TBCR
20.0000 meq | EXTENDED_RELEASE_TABLET | Freq: Once | ORAL | Status: AC
  Administered 2024-06-14: 20 meq via ORAL
  Filled 2024-06-14: qty 1

## 2024-06-14 MED ORDER — ACETAMINOPHEN 650 MG RE SUPP: 650.0000 mg | Freq: Four times a day (QID) | RECTAL | Status: DC | PRN

## 2024-06-14 MED ORDER — MELATONIN 3 MG PO TABS
3.0000 mg | ORAL_TABLET | Freq: Every evening | ORAL | Status: DC | PRN
  Administered 2024-06-14 – 2024-06-17 (×3): 3 mg via ORAL
  Filled 2024-06-14 (×3): qty 1

## 2024-06-14 MED ORDER — EMPAGLIFLOZIN 25 MG PO TABS
25.0000 mg | ORAL_TABLET | Freq: Every morning | ORAL | Status: DC
  Administered 2024-06-15 – 2024-06-18 (×4): 25 mg via ORAL
  Filled 2024-06-14 (×4): qty 1

## 2024-06-14 MED ORDER — FLUTICASONE PROPIONATE 50 MCG/ACT NA SUSP
2.0000 | Freq: Every day | NASAL | Status: DC
  Administered 2024-06-15 – 2024-06-18 (×4): 2 via NASAL
  Filled 2024-06-14: qty 16

## 2024-06-14 MED ORDER — ROSUVASTATIN CALCIUM 5 MG PO TABS
10.0000 mg | ORAL_TABLET | Freq: Every day | ORAL | Status: DC
  Administered 2024-06-14 – 2024-06-15 (×2): 10 mg via ORAL
  Filled 2024-06-14 (×2): qty 2

## 2024-06-14 MED ORDER — PROMETHAZINE HCL 25 MG PO TABS
25.0000 mg | ORAL_TABLET | Freq: Four times a day (QID) | ORAL | Status: DC | PRN
  Administered 2024-06-15 (×2): 25 mg via ORAL
  Filled 2024-06-14 (×2): qty 1

## 2024-06-14 MED ORDER — PREGABALIN 75 MG PO CAPS
150.0000 mg | ORAL_CAPSULE | Freq: Three times a day (TID) | ORAL | Status: DC
  Administered 2024-06-14 – 2024-06-18 (×12): 150 mg via ORAL
  Filled 2024-06-14 (×12): qty 2

## 2024-06-14 MED ORDER — TORSEMIDE 20 MG PO TABS
40.0000 mg | ORAL_TABLET | Freq: Two times a day (BID) | ORAL | Status: DC
  Administered 2024-06-15: 40 mg via ORAL
  Filled 2024-06-14: qty 2

## 2024-06-14 MED ORDER — MONTELUKAST SODIUM 10 MG PO TABS
10.0000 mg | ORAL_TABLET | Freq: Every day | ORAL | Status: DC
  Administered 2024-06-14 – 2024-06-18 (×5): 10 mg via ORAL
  Filled 2024-06-14 (×5): qty 1

## 2024-06-14 MED ORDER — FAMOTIDINE 20 MG PO TABS
20.0000 mg | ORAL_TABLET | Freq: Two times a day (BID) | ORAL | Status: DC
  Administered 2024-06-14 – 2024-06-18 (×8): 20 mg via ORAL
  Filled 2024-06-14 (×8): qty 1

## 2024-06-14 MED ORDER — PANTOPRAZOLE SODIUM 40 MG IV SOLR
40.0000 mg | Freq: Every day | INTRAVENOUS | Status: DC
  Administered 2024-06-14: 40 mg via INTRAVENOUS
  Filled 2024-06-14: qty 10

## 2024-06-14 MED ORDER — SACUBITRIL-VALSARTAN 24-26 MG PO TABS
1.0000 | ORAL_TABLET | Freq: Two times a day (BID) | ORAL | Status: DC
  Administered 2024-06-15: 1 via ORAL
  Filled 2024-06-14: qty 1

## 2024-06-14 MED ORDER — ALUM & MAG HYDROXIDE-SIMETH 200-200-20 MG/5ML PO SUSP: 15.0000 mL | Freq: Four times a day (QID) | ORAL | Status: DC | PRN

## 2024-06-14 MED ORDER — CLONIDINE HCL 0.1 MG PO TABS: 0.1000 mg | ORAL_TABLET | Freq: Every day | ORAL | Status: DC

## 2024-06-14 NOTE — ED Notes (Signed)
 Bedside ultra sound  at present

## 2024-06-14 NOTE — H&P (Addendum)
 " History and Physical    Patient: [name] FMW:[id number] DOB: [birth year] DOA: 06/13/2024 DOS: the patient was seen and examined on 06/14/2024 PCP: Stephanie Charlene CROME, MD  Patient coming from: Home  Chief Complaint:  Chief Complaint  Patient presents with   Chest Pain   HPI: [name] is a 53 y.o. female with medical history significant of NICM, HFimpEF (previously 20-25% in 2015; 55-60% in 05/2024; follows Dr. Rolan), anemia, DM, HTN, HLD, depression, fibromyalgia, asthma, bipolar d/o, OSA not on CPAP, and obesity s/p gastric bypass  who p/w chest pain and found to have acute transaminitis.  The patient presented to the hospital with complaints of chest pain. The patient reported that the pain was persistent and did not improve with any interventions. The patient stated that the pain prompted a visit to the hospital around four o'clock. Upon arrival, medication (IV morphine ) was administered, which provided relief. The patient expressed concern about the possibility of a cardiac issue.  In the ED, pt AFVSS. Labs notable for AST/ALT 85/71>859/252 over 10h. CXR w/o acute issues. EKG w/ NSR and LVH. EDP requested observation for chest pain and rapidly rising LFTs.   Review of Systems: As mentioned in the history of present illness. All other systems reviewed and are negative. Past Medical History:  Diagnosis Date   Anemia    hx of iron transfusions - last one 10/2013    Anxiety    Arthritis    neck and shoulders    Asthma    Back pain, chronic    Bipolar disorder (HCC)    Carpal tunnel syndrome on both sides    Cataract    Depression    Diabetes mellitus    Fibromyalgia    GERD (gastroesophageal reflux disease)    History of gastric bypass Jan 2013   Hyperlipidemia    Hypertension    Insomnia    Migraines    Nervous breakdown sept 2013   Neuropathy    Nonischemic cardiomyopathy (HCC)    OSA (obstructive sleep apnea) 02/21/2015   Peripheral vascular disease     Shortness of breath    with exertion    Sleep apnea    borderline- no cpap yet   Systolic CHF, acute on chronic (HCC)    CARDIOLOGIST-- DR LEIM MOOSE  ([place])   Wound of left breast    Past Surgical History:  Procedure Laterality Date   BACK SURGERY  04/1991   BREAST CYST ASPIRATION Left    BREAST SURGERY Left    I&D for abcsess    CARDIAC CATHETERIZATION  11-14-2013  DR DALTON MCLEAN   NORMAL LV FILLING PRESSURE W/ MILD ELEVATED RV FILLING, POSSIBLY SUFFESTING RV FAILURE OUT OF PROPORTION TO LV FAILURE. EF ABOUT 35% WITH DIFFUSE HYPOKINESIS. NO ANGIOGRAPHIC CAD, NONISCHEMIC CARDIOMYOPATHY   CARPAL TUNNEL RELEASE Bilateral    CATARACT EXTRACTION Right    CESAREAN SECTION  05/1997   CHOLECYSTECTOMY  1998   DILITATION & CURRETTAGE/HYSTROSCOPY WITH NOVASURE ABLATION N/A 02/26/2014   Procedure: DILATATION & CURETTAGE/HYSTEROSCOPY WITH NOVASURE ABLATION;  Surgeon: Charlie CHRISTELLA Croak, MD;  Location: [facility];  Service: Gynecology;  Laterality: N/A;   ESOPHAGOGASTRODUODENOSCOPY  07/27/2011   Procedure: ESOPHAGOGASTRODUODENOSCOPY (EGD);  Surgeon: Elsie CHRISTELLA Cree, MD;  Location: [facility];  Service: Endoscopy;  Laterality: N/A;   GASTRIC ROUX-EN-Y  06/30/2011   Procedure: LAPAROSCOPIC ROUX-EN-Y GASTRIC;  Surgeon: Morene ONEIDA Olives, MD;  Location: [facility];  Service: General;  Laterality: N/A;      INCISION  AND DRAINAGE OF WOUND Left 12/14/2013   Procedure: IRRIGATION AND DEBRIDEMENT OF LEFT BREAST WOUND WITH PLACEMENT OF A-CELL, WITH CLOSURE ;  Surgeon: Estefana Reichert, DO;  Location: [facility];  Service: Plastics;  Laterality: Left;   IRRIGATION AND DEBRIDEMENT ABSCESS Left 11/26/2013   Procedure: IRRIGATION AND DEBRIDEMENT ABSCESS;  Surgeon: Krystal CHRISTELLA Spinner, MD;  Location: [facility];  Service: General;  Laterality: Left;   LEFT AND RIGHT HEART CATHETERIZATION WITH CORONARY ANGIOGRAM N/A 11/14/2013   Procedure: LEFT AND RIGHT HEART CATHETERIZATION WITH CORONARY ANGIOGRAM;  Surgeon: Ezra GORMAN Shuck, MD;  Location: [facility] C[facility] LAB;  Service: Cardiovascular;  Laterality: N/A;   SPINAL CORD STIMULATOR INSERTION N/A 03/09/2014   Procedure: LUMBAR SPINAL CORD STIMULATOR INSERTION;  Surgeon: Deward JAYSON Fabian, MD;  Location: [facility];  Service: Neurosurgery;  Laterality: N/A;   TUBAL LIGATION  06/1997   Social History:  reports that she has never smoked. She has never used smokeless tobacco. She reports that she does not drink alcohol and does not use drugs.  Allergies[1]  Family History  Problem Relation Age of Onset   Hypertension Mother    Heart disease Sister    Hypertension Brother    Heart disease Brother    Diabetes Maternal Aunt    Diabetes Paternal Aunt    Diabetes Paternal Grandmother    Diabetes Paternal Grandfather    Hypertension Paternal Grandfather    Heart attack Neg Hx    Stroke Neg Hx    Colon cancer Neg Hx    Colon polyps Neg Hx    Esophageal cancer Neg Hx    Rectal cancer Neg Hx    Stomach cancer Neg Hx    Breast cancer Neg Hx     Prior to Admission medications  Medication Sig Start Date End Date Taking? Authorizing Provider  albuterol  (VENTOLIN  HFA) 108 (90 Base) MCG/ACT inhaler Inhale 2 puffs into the lungs every 4 (four) hours as needed for wheezing or shortness of breath.   Yes [provider]  Biotin  (BIOTIN  EXTRA STRENGTH) 10 MG CAPS Take 10 mg by mouth in the morning.   Yes [provider]  Cholecalciferol (VITAMIN D3) 5000 units TABS Take 5,000 Units by mouth in the morning.   Yes [provider]  cloNIDine (CATAPRES) 0.1 MG tablet Take 0.1 mg by mouth at bedtime. 11/14/20  Yes [provider]  DULoxetine  (CYMBALTA ) 30 MG capsule Take 30 mg by mouth daily.   Yes [provider]  DULoxetine  (CYMBALTA ) 60 MG capsule Take 60 mg by mouth daily.   Yes [provider]  empagliflozin (JARDIANCE) 25 MG TABS tablet Take 25 mg by mouth in the morning.   Yes [provider]  famotidine  (PEPCID ) 20  MG tablet Take 20 mg by mouth 2 (two) times daily.   Yes [provider]  fluticasone  (FLONASE ) 50 MCG/ACT nasal spray Place 2 sprays into both nostrils daily. 03/07/13  Yes [provider]  metoprolol  succinate (TOPROL -XL) 25 MG 24 hr tablet Take 25 mg by mouth in the morning.   Yes [provider]  montelukast  (SINGULAIR ) 10 MG tablet Take 10 mg by mouth daily. 04/29/23  Yes [provider]  MOUNJARO 15 MG/0.5ML Pen Inject 15 mg into the skin every Saturday. 12/25/23  Yes [provider]  ondansetron  (ZOFRAN ) 4 MG tablet TAKE 1 TABLET BY MOUTH EVERY 8 HOURS AS NEEDED FOR NAUSEA AND VOMITING 12/14/18  Yes Shuck Ezra GORMAN, MD  oxyCODONE -acetaminophen  (PERCOCET) 10-325 MG  per tablet Take 0.5-1 tablets by mouth every 4 (four) hours as needed for pain.   Yes [provider]  potassium chloride  (KLOR-CON ) 10 MEQ tablet TAKE 6 TABLETS BY MOUTH 2 (TWO) TIMES DAILY FOR 3 DAYS, THEN 6 TABLETS (60 MEQ TOTAL) DAILY. Patient taking differently: Take 20 mEq by mouth 2 (two) times daily. 05/22/24  Yes Milford, Harlene HERO, FNP  pregabalin  (LYRICA ) 150 MG capsule Take 150 mg by mouth 3 (three) times daily.   Yes [provider]  promethazine  (PHENERGAN ) 25 MG tablet promethazine  25 mg tablet  TAKE 1/2 TO 1 TABLET BY MOUTH EVERY 6 HOURS AS NEEDED FOR NAUSEA   Yes [provider]  rosuvastatin  (CRESTOR ) 10 MG tablet Take 1 tablet (10 mg total) by mouth daily. 10/23/20  Yes [name], MD  sacubitril -valsartan  (ENTRESTO ) 24-26 MG Take 1 tablet by mouth 2 (two) times daily. 04/26/23  Yes Milford, Harlene HERO, FNP  solifenacin (VESICARE) 5 MG tablet solifenacin 5 mg tablet  TAKE 1 (ONE) TABLET DAILY FOR BLADDER CONTROL   Yes [provider]  spironolactone  (ALDACTONE ) 25 MG tablet TAKE 1 TABLET (25 MG TOTAL) BY MOUTH DAILY. 04/03/24  Yes [name], MD  torsemide  (DEMADEX ) 20 MG tablet TAKE 2 TABLETS BY MOUTH 2 (TWO) TIMES DAILY FOR 3  DAYS, THEN 1 TABLET (20 MG TOTAL) 2 TIMES DAILY. Patient taking differently: Take 40 mg by mouth 2 (two) times daily. 03/15/24  Yes [name], MD    Physical Exam: Vitals:   06/14/24 0758 06/14/24 0845 06/14/24 0900 06/14/24 1005  BP:  107/63  99/66  Pulse:  88 86 88  Resp:  14 15 12   Temp: (!) 97.5 F (36.4 C)     TempSrc: Oral     SpO2:  100% 94% 96%   General: Alert, oriented x3, resting comfortably in no acute distress Respiratory: Lungs clear to auscultation bilaterally with normal respiratory effort; no w/r/r Cardiovascular: Regular rate and rhythm w/o m/r/g Abdomen: Soft, nontender, nondistended. Positive bowel sounds   Data Reviewed:  Lab Results  Component Value Date   WBC 9.6 06/14/2024   HGB 12.8 06/14/2024   HCT 38.4 06/14/2024   MCV 91.0 06/14/2024   PLT 186 06/14/2024   Lab Results  Component Value Date   GLUCOSE 118 (H) 06/14/2024   CALCIUM  8.8 (L) 06/14/2024   NA 139 06/14/2024   K 4.1 06/14/2024   CO2 25 06/14/2024   CL 103 06/14/2024   BUN 21 (H) 06/14/2024   CREATININE 1.09 (H) 06/14/2024   Lab Results  Component Value Date   ALT 252 (H) 06/14/2024   AST 859 (H) 06/14/2024   ALKPHOS 326 (H) 06/14/2024   BILITOT 0.8 06/14/2024   Lab Results  Component Value Date   INR 1.0 11/02/2013   INR 0.94 03/14/2012   Radiology: DG Chest 2 View Result Date: 06/13/2024 CLINICAL DATA:  Chest pain. EXAM: CHEST - 2 VIEW COMPARISON:  02/10/2023 FINDINGS: The cardiac silhouette, mediastinal and hilar contours are within normal limits. The lungs are clear. No infiltrates, edema or effusions. No pulmonary lesions. No pneumothorax. The bony thorax is intact. Stable thoracic spinal cord stimulator. IMPRESSION: No acute cardiopulmonary findings. Electronically Signed   By: MYRTIS Stammer M.D.   On: 06/13/2024 19:49    Assessment and Plan: 50F h/o NICM, HFimpEF (previously 20-25% in 2015; 55-60% in 05/2024; follows Dr. Rolan), anemia, DM, HTN, HLD,  depression, fibromyalgia, asthma, bipolar d/o, OSA not on CPAP, and obesity  s/p gastric bypass  who p/w chest pain and found to have acute transaminitis.  Acute transaminitis High suspicion for spurious lab; if abnl on repeat will consult GI -GI consulted; apprec eval/recs -F/u CMP; if hepatic enzymes remain elevated consider GI consult  HFimpEF -PTA metoprolol , Entresto , torsemide  and spironolactone   Fibromyalgia -PTA pregabalin  150mg  TID -Tramadol prn -Avoid narcotics if possible   Advance Care Planning:   Code Status: Full Code   Consults: N/A  Family Communication: N/A  Severity of Illness: The appropriate patient status for this patient is OBSERVATION. Observation status is judged to be reasonable and necessary in order to provide the required intensity of service to ensure the patient's safety. The patient's presenting symptoms, physical exam findings, and initial radiographic and laboratory data in the context of their medical condition is felt to place them at decreased risk for further clinical deterioration. Furthermore, it is anticipated that the patient will be medically stable for discharge from the hospital within 2 midnights of admission.    ------- I spent 55 minutes reviewing previous notes, at the bedside counseling/discussing the treatment plan, and performing clinical documentation.  Author: Marsha Ada, MD 06/14/2024 10:35 AM  For on call review [email or web address].      [1]  Allergies Allergen Reactions   Imitrex  [Sumatriptan ] Other (See Comments)    Acid reflux---pill only-- shots are okay    Lamictal [Lamotrigine] Nausea And Vomiting   Metformin And Related Diarrhea   "

## 2024-06-14 NOTE — ED Notes (Signed)
 Patient given soda and tolerated well.

## 2024-06-14 NOTE — ED Notes (Signed)
 Pt on cardiac monitor via dinamap at bedside. Alarms active

## 2024-06-14 NOTE — Progress Notes (Signed)
 Heart Failure Navigator Progress Note  Assessed for Heart & Vascular TOC clinic readiness.  Patient does not meet criteria due to she is an Advanced Heart Failure Team patient of Dr. Rolan. .   Navigator will sign off at this time.   Stephane Haddock, BSN, Scientist, clinical (histocompatibility and immunogenetics) Only

## 2024-06-14 NOTE — ED Notes (Signed)
 Fluids removed from bedside with acknowledgement of NPO at 0500.

## 2024-06-14 NOTE — Progress Notes (Signed)
" °  Carryover admission to the Day Admitter.  I discussed this case with the EDP, Lamar Schlossman, PA.  Per these discussions:   This is a 53 year old female with reported history of nonischemic cardiomyopathy for which she follows with Dr. Rolan as her outpatient cardiologist, who is being admitted for further evaluation of chest pain.   She has been experiencing proximately 10 days of intermittent left-sided chest pressure radiating to the left jaw and left arm, nonexertional, with some associated shortness of breath.  In this setting, she followed up with her outpatient cardiologist approximately 1 week ago, who recommended to the patient that if she continues to experience these intermittent episodes of chest discomfort, that she should present to the emergency department for further evaluation.  As she has continued to experience intermittent episodes of chest discomfort in the interval, she presents this evening to [facility] emergency department for further evaluation and management thereof.  She has not taken any nitroglycerin  over the course of the last 10 days for her chest pain.  She is reported to have no known history of obstructive CAD, although cardiac CT scan from a few years ago was reported to show atherosclerotic disease associated the distal LAD.  Most recent echocardiogram occurred slightly more than a week ago, on 06/05/2024.  Vital signs in the ED notable for the following: Afebrile; rates in the 80s to 90s; systolic blood pressures in the mid to high 90s.  Respiratory rate 16 and oxygen saturation in the high 90s to 100% on room air.  Initial high sensitive troponin I was 27, with repeat trending down to 24.  proBNP was less than 50.  EKG was reported to show no overt evidence of acute ischemic changes, including no evidence of STEMI.  2 view chest x-ray showed no evidence of acute cardiopulmonary process.  Additional presenting labs were notable for CMP which showed potassium  3.8, as well as mild transaminitis, with AST 85, ALT 71, alkaline phosphatase 178, total bilirubin 0.5.  CBC notable for Locasol count 7200, hemoglobin 12.5.  In the ED, she has received a full dose aspirin  x 1, and she is currently receiving a dose of IV morphine .  I have placed an order for observation for further evaluation and management of her chest pain.  I have placed some additional preliminary admit orders via the adult multi-morbid admission order set. I have also ordered potassium chloride  20 meq p.o. x 1 dose now, Protonix  40 mg IV daily, first dose now, prn Maalox/Mylanta for heartburn, prn IV fentanyl , incentive spirometry.  I have made the patient n.p.o. after 5 AM pending any potential ensuing evaluation by cardiology.   Will further trend her mild transaminitis with repeat CMP in the morning.  Have also ordered lipase level.  Additional morning labs include CBC, magnesium  level.    Eva Pore, DO Hospitalist  "

## 2024-06-14 NOTE — ED Notes (Signed)
 Lunch tray ordered

## 2024-06-14 NOTE — Care Management Obs Status (Signed)
 MEDICARE OBSERVATION STATUS NOTIFICATION   Patient Details  Name: [name] MRN: [id number] Date of Birth: 01-Mar-1971   Medicare Observation Status Notification Given:  Yes    Merilee LOISE Batty, RN 06/14/2024, 3:48 PM

## 2024-06-14 NOTE — ED Notes (Signed)
 Patient resting in position of comfort. Responds to verbal and reports pain better maybe 5/10.

## 2024-06-14 NOTE — Progress Notes (Signed)
" ° °  Brief Progress Note   _____________________________________________________________________________________________________________  Patient Name: [name] Patient DOB: July 31, 1970 Date: @TODAY @      Data: Reviewed labs, notes, VS.    Action: No action needed at this time.      Response:    _____________________________________________________________________________________________________________  [facility] RN Expeditor Sharolyn JONETTA Batman Please contact us  directly via secure chat (search for [facility]) or by calling us  at [phone] [facility]).  "

## 2024-06-14 NOTE — Consult Note (Addendum)
 "  Consultation  Referring Provider: [facility] Primary Care Physician:  Stephanie Charlene CROME, MD Primary Gastroenterologist:  Dr. Legrand  Reason for Consultation: Chest pain, elevated LFTs  HPI: [name] is a 53 y.o. female with history of nonischemic cardiomyopathy this is very recent echo 06/05/2024 with EF 55 to 60%, trivial mitral regurgitation.  Also with history of diabetes mellitus, hypertension, hyperlipidemia, fibromyalgia, bipolar disorder, GERD, remote Roux-en-Y gastric bypass, remote cholecystectomy, history of chronic constipation, sleep apnea and peripheral vascular disease. She presented to the emergency room on the evening of 06/13/2024 with complaint of chest pressure associated with mild shortness of breath.  She related pain more on the left side of her chest up into her neck on the left and into her left arm.  On further history she says that she had an episode about 2 weeks ago that lasted for 4 days and was very similar to this.  She was trying to wait to see Dr. Rolan as an outpatient and then the pain just resolved.  It recurred early yesterday and she decided to come to the emergency room.  She describes it as a constant pressure type sensation even at rest.  Does seem to be exacerbated by movement turning over at [place].  She has not had any documented fever or chills, no nausea or vomiting, no recent injury.  No complaints of abdominal pain.  No dysphagia or dyne aphasia. Initial workup with flat troponins Chest x-ray no acute cardiopulmonary findings  Initial labs yesterday with WBC 7.2/Hb 12.5/hematocrit 39.1/platelets 203 BNP less than 50 Troponin 27.>  24 BUN 22/creatinine 1.10 T. bili 0.4/alk phos 178/AST 85/ALT 71  Repeat labs today-CBC normal T. bili 0.8/AST 859/ALT 252/alk phos 326 Lipase normal  She has not had any abdominal imaging  Patient says her pain is still present, she feels most comfortable lying on her left side .  Patient says she has  not been on any new medications, had any medication changes, no other substance use, no known sick contacts , no regular EtOH.  She did have EGD and colonoscopy August 2025-EGD done for complaints of dysphagia, esophagus normal, no stricture noted, evidence of gastric bypass found to gastric pouch normal size healthy-appearing, examined jejunum normal Colonoscopy-with copious quantities of thick stool in the entire colon precluding visualization and plan was for repeat colonoscopy in 3 to 6 months with double prep  Patient says she has been taking Movantik , but is still having chronic constipation though she is having increased number of bowel movements per week compared to previous.  She knows she is due to schedule a follow-up:  Past Medical History:  Diagnosis Date   Anemia    hx of iron transfusions - last one 10/2013    Anxiety    Arthritis    neck and shoulders    Asthma    Back pain, chronic    Bipolar disorder (HCC)    Carpal tunnel syndrome on both sides    Cataract    Depression    Diabetes mellitus    Fibromyalgia    GERD (gastroesophageal reflux disease)    History of gastric bypass Jan 2013   Hyperlipidemia    Hypertension    Insomnia    Migraines    Nervous breakdown sept 2013   Neuropathy    Nonischemic cardiomyopathy (HCC)    OSA (obstructive sleep apnea) 02/21/2015   Peripheral vascular disease    Shortness of breath    with exertion  Sleep apnea    borderline- no cpap yet   Systolic CHF, acute on chronic (HCC)    CARDIOLOGIST-- DR LEIM MOOSE  ([place])   Wound of left breast     Past Surgical History:  Procedure Laterality Date   BACK SURGERY  04/1991   BREAST CYST ASPIRATION Left    BREAST SURGERY Left    I&D for abcsess    CARDIAC CATHETERIZATION  11-14-2013  DR DALTON MCLEAN   NORMAL LV FILLING PRESSURE W/ MILD ELEVATED RV FILLING, POSSIBLY SUFFESTING RV FAILURE OUT OF PROPORTION TO LV FAILURE. EF ABOUT 35% WITH DIFFUSE HYPOKINESIS. NO  ANGIOGRAPHIC CAD, NONISCHEMIC CARDIOMYOPATHY   CARPAL TUNNEL RELEASE Bilateral    CATARACT EXTRACTION Right    CESAREAN SECTION  05/1997   CHOLECYSTECTOMY  1998   DILITATION & CURRETTAGE/HYSTROSCOPY WITH NOVASURE ABLATION N/A 02/26/2014   Procedure: DILATATION & CURETTAGE/HYSTEROSCOPY WITH NOVASURE ABLATION;  Surgeon: Charlie CHRISTELLA Croak, MD;  Location: [facility];  Service: Gynecology;  Laterality: N/A;   ESOPHAGOGASTRODUODENOSCOPY  07/27/2011   Procedure: ESOPHAGOGASTRODUODENOSCOPY (EGD);  Surgeon: Elsie CHRISTELLA Cree, MD;  Location: [facility];  Service: Endoscopy;  Laterality: N/A;   GASTRIC ROUX-EN-Y  06/30/2011   Procedure: LAPAROSCOPIC ROUX-EN-Y GASTRIC;  Surgeon: Morene ONEIDA Olives, MD;  Location: [facility];  Service: General;  Laterality: N/A;      INCISION AND DRAINAGE OF WOUND Left 12/14/2013   Procedure: IRRIGATION AND DEBRIDEMENT OF LEFT BREAST WOUND WITH PLACEMENT OF A-CELL, WITH CLOSURE ;  Surgeon: Estefana Reichert, DO;  Location: [facility];  Service: Plastics;  Laterality: Left;   IRRIGATION AND DEBRIDEMENT ABSCESS Left 11/26/2013   Procedure: IRRIGATION AND DEBRIDEMENT ABSCESS;  Surgeon: Krystal CHRISTELLA Spinner, MD;  Location: [facility];  Service: General;  Laterality: Left;   LEFT AND RIGHT HEART CATHETERIZATION WITH CORONARY ANGIOGRAM N/A 11/14/2013   Procedure: LEFT AND RIGHT HEART CATHETERIZATION WITH CORONARY ANGIOGRAM;  Surgeon: Ezra GORMAN Shuck, MD;  Location[facility];  Service: Cardiovascular;  Laterality: N/A;   SPINAL CORD STIMULATOR INSERTION N/A 03/09/2014   Procedure: LUMBAR SPINAL CORD STIMULATOR INSERTION;  Surgeon: Deward JAYSON Fabian, MD;  Location: [facility];  Service: Neurosurgery;  Laterality: N/A;   TUBAL LIGATION  06/1997    Prior to Admission medications  Medication Sig Start Date End Date Taking? Authorizing Provider  albuterol  (VENTOLIN  HFA) 108 (90 Base) MCG/ACT inhaler Inhale 2 puffs into the lungs every 4 (four) hours as needed for wheezing or shortness of breath.   Yes  [provider]  Biotin  (BIOTIN  EXTRA STRENGTH) 10 MG CAPS Take 10 mg by mouth in the morning.   Yes [provider]  Cholecalciferol (VITAMIN D3) 5000 units TABS Take 5,000 Units by mouth in the morning.   Yes [provider]  cloNIDine (CATAPRES) 0.1 MG tablet Take 0.1 mg by mouth at bedtime. 11/14/20  Yes [provider]  DULoxetine  (CYMBALTA ) 30 MG capsule Take 30 mg by mouth daily.   Yes [provider]  DULoxetine  (CYMBALTA ) 60 MG capsule Take 60 mg by mouth daily.   Yes [provider]  empagliflozin (JARDIANCE) 25 MG TABS tablet Take 25 mg by mouth in the morning.   Yes [provider]  famotidine  (PEPCID ) 20 MG tablet Take 20 mg by mouth 2 (two) times daily.   Yes [provider]  fluticasone  (FLONASE ) 50 MCG/ACT nasal spray Place 2 sprays into both nostrils daily. 03/07/13  Yes [provider]  metoprolol  succinate (TOPROL -XL) 25 MG 24 hr tablet Take 25  mg by mouth in the morning.   Yes [provider]  montelukast  (SINGULAIR ) 10 MG tablet Take 10 mg by mouth daily. 04/29/23  Yes [provider]  MOUNJARO 15 MG/0.5ML Pen Inject 15 mg into the skin every Saturday. 12/25/23  Yes [provider]  ondansetron  (ZOFRAN ) 4 MG tablet TAKE 1 TABLET BY MOUTH EVERY 8 HOURS AS NEEDED FOR NAUSEA AND VOMITING 12/14/18  Yes [name], MD  oxyCODONE -acetaminophen  (PERCOCET) 10-325 MG per tablet Take 0.5-1 tablets by mouth every 4 (four) hours as needed for pain.   Yes [provider]  potassium chloride  (KLOR-CON ) 10 MEQ tablet TAKE 6 TABLETS BY MOUTH 2 (TWO) TIMES DAILY FOR 3 DAYS, THEN 6 TABLETS (60 MEQ TOTAL) DAILY. Patient taking differently: Take 20 mEq by mouth 2 (two) times daily. 05/22/24  Yes Milford, Harlene HERO, FNP  pregabalin  (LYRICA ) 150 MG capsule Take 150 mg by mouth 3 (three) times daily.   Yes [provider]  promethazine  (PHENERGAN ) 25 MG tablet promethazine  25 mg  tablet  TAKE 1/2 TO 1 TABLET BY MOUTH EVERY 6 HOURS AS NEEDED FOR NAUSEA   Yes [provider]  rosuvastatin  (CRESTOR ) 10 MG tablet Take 1 tablet (10 mg total) by mouth daily. 10/23/20  Yes [name], MD  sacubitril -valsartan  (ENTRESTO ) 24-26 MG Take 1 tablet by mouth 2 (two) times daily. 04/26/23  Yes Milford, Harlene HERO, FNP  solifenacin (VESICARE) 5 MG tablet solifenacin 5 mg tablet  TAKE 1 (ONE) TABLET DAILY FOR BLADDER CONTROL   Yes [provider]  spironolactone  (ALDACTONE ) 25 MG tablet TAKE 1 TABLET (25 MG TOTAL) BY MOUTH DAILY. 04/03/24  Yes [name], MD  torsemide  (DEMADEX ) 20 MG tablet TAKE 2 TABLETS BY MOUTH 2 (TWO) TIMES DAILY FOR 3 DAYS, THEN 1 TABLET (20 MG TOTAL) 2 TIMES DAILY. Patient taking differently: Take 40 mg by mouth 2 (two) times daily. 03/15/24  Yes [name], MD    Current Facility-Administered Medications  Medication Dose Route Frequency Provider Last Rate Last Admin   acetaminophen  (TYLENOL ) tablet 650 mg  650 mg Oral Q6H PRN Howerter, Justin B, DO   650 mg at 06/14/24 1417   Or   acetaminophen  (TYLENOL ) suppository 650 mg  650 mg Rectal Q6H PRN Howerter, Justin B, DO       alum & mag hydroxide-simeth (MAALOX/MYLANTA) 200-200-20 MG/5ML suspension 15 mL  15 mL Oral Q6H PRN Howerter, Justin B, DO       DULoxetine  (CYMBALTA ) DR capsule 90 mg  90 mg Oral Daily Georgina Basket, MD       NOREEN ON 06/15/2024] empagliflozin (JARDIANCE) tablet 25 mg  25 mg Oral q AM Georgina Basket, MD       famotidine  (PEPCID ) tablet 20 mg  20 mg Oral BID Georgina Basket, MD       fluticasone  (FLONASE ) 50 MCG/ACT nasal spray 2 spray  2 spray Each Nare Daily Georgina Basket, MD       melatonin tablet 3 mg  3 mg Oral QHS PRN Howerter, Justin B, DO       [START ON 06/15/2024] metoprolol  succinate (TOPROL -XL) 24 hr tablet 25 mg  25 mg Oral q AM Georgina Basket, MD       montelukast  (SINGULAIR ) tablet 10 mg  10 mg Oral Daily Georgina Basket, MD       naloxone (NARCAN)  injection 0.4 mg  0.4 mg Intravenous PRN Howerter, Justin B, DO       ondansetron  (  ZOFRAN ) injection 4 mg  4 mg Intravenous Q6H PRN Howerter, Justin B, DO       pantoprazole  (PROTONIX ) EC tablet 40 mg  40 mg Oral QHS Georgina Basket, MD       pregabalin  (LYRICA ) capsule 150 mg  150 mg Oral TID Georgina Basket, MD       promethazine  (PHENERGAN ) tablet 25 mg  25 mg Oral Q6H PRN Georgina Basket, MD       rosuvastatin  (CRESTOR ) tablet 10 mg  10 mg Oral Daily Georgina Basket, MD       sacubitril -valsartan  (ENTRESTO ) 24-26 mg per tablet  1 tablet Oral BID Georgina Basket, MD       spironolactone  (ALDACTONE ) tablet 25 mg  25 mg Oral Daily Georgina Basket, MD       torsemide  (DEMADEX ) tablet 40 mg  40 mg Oral BID Georgina Basket, MD       traMADol DANNY) tablet 50 mg  50 mg Oral Q6H PRN Georgina Basket, MD       Current Outpatient Medications  Medication Sig Dispense Refill   albuterol  (VENTOLIN  HFA) 108 (90 Base) MCG/ACT inhaler Inhale 2 puffs into the lungs every 4 (four) hours as needed for wheezing or shortness of breath.     Biotin  (BIOTIN  EXTRA STRENGTH) 10 MG CAPS Take 10 mg by mouth in the morning.     Cholecalciferol (VITAMIN D3) 5000 units TABS Take 5,000 Units by mouth in the morning.     cloNIDine (CATAPRES) 0.1 MG tablet Take 0.1 mg by mouth at bedtime.     DULoxetine  (CYMBALTA ) 30 MG capsule Take 30 mg by mouth daily.     DULoxetine  (CYMBALTA ) 60 MG capsule Take 60 mg by mouth daily.     empagliflozin (JARDIANCE) 25 MG TABS tablet Take 25 mg by mouth in the morning.     famotidine  (PEPCID ) 20 MG tablet Take 20 mg by mouth 2 (two) times daily.     fluticasone  (FLONASE ) 50 MCG/ACT nasal spray Place 2 sprays into both nostrils daily.     metoprolol  succinate (TOPROL -XL) 25 MG 24 hr tablet Take 25 mg by mouth in the morning.     montelukast  (SINGULAIR ) 10 MG tablet Take 10 mg by mouth daily.     MOUNJARO 15 MG/0.5ML Pen Inject 15 mg into the skin every Saturday.     ondansetron  (ZOFRAN ) 4 MG tablet TAKE  1 TABLET BY MOUTH EVERY 8 HOURS AS NEEDED FOR NAUSEA AND VOMITING 20 tablet 0   oxyCODONE -acetaminophen  (PERCOCET) 10-325 MG per tablet Take 0.5-1 tablets by mouth every 4 (four) hours as needed for pain.     potassium chloride  (KLOR-CON ) 10 MEQ tablet TAKE 6 TABLETS BY MOUTH 2 (TWO) TIMES DAILY FOR 3 DAYS, THEN 6 TABLETS (60 MEQ TOTAL) DAILY. (Patient taking differently: Take 20 mEq by mouth 2 (two) times daily.) 180 tablet 3   pregabalin  (LYRICA ) 150 MG capsule Take 150 mg by mouth 3 (three) times daily.     promethazine  (PHENERGAN ) 25 MG tablet promethazine  25 mg tablet  TAKE 1/2 TO 1 TABLET BY MOUTH EVERY 6 HOURS AS NEEDED FOR NAUSEA     rosuvastatin  (CRESTOR ) 10 MG tablet Take 1 tablet (10 mg total) by mouth daily. 90 tablet 3   sacubitril -valsartan  (ENTRESTO ) 24-26 MG Take 1 tablet by mouth 2 (two) times daily. 60 tablet 11   solifenacin (VESICARE) 5 MG tablet solifenacin 5 mg tablet  TAKE 1 (ONE) TABLET DAILY FOR BLADDER CONTROL     spironolactone  (ALDACTONE )  25 MG tablet TAKE 1 TABLET (25 MG TOTAL) BY MOUTH DAILY. 90 tablet 1   torsemide  (DEMADEX ) 20 MG tablet TAKE 2 TABLETS BY MOUTH 2 (TWO) TIMES DAILY FOR 3 DAYS, THEN 1 TABLET (20 MG TOTAL) 2 TIMES DAILY. (Patient taking differently: Take 40 mg by mouth 2 (two) times daily.) 180 tablet 3    Allergies as of 06/13/2024 - Review Complete 06/13/2024  Allergen Reaction Noted   Imitrex  [sumatriptan ] Other (See Comments) 02/17/2012   Lamictal [lamotrigine] Nausea And Vomiting 08/07/2019   Metformin and related Diarrhea 12/23/2020    Family History  Problem Relation Age of Onset   Hypertension Mother    Heart disease Sister    Hypertension Brother    Heart disease Brother    Diabetes Maternal Aunt    Diabetes Paternal Aunt    Diabetes Paternal Grandmother    Diabetes Paternal Grandfather    Hypertension Paternal Grandfather    Heart attack Neg Hx    Stroke Neg Hx    Colon cancer Neg Hx    Colon polyps Neg Hx    Esophageal cancer  Neg Hx    Rectal cancer Neg Hx    Stomach cancer Neg Hx    Breast cancer Neg Hx     Social History   Socioeconomic History   Marital status: Single    Spouse name: Not on file   Number of children: Not on file   Years of education: Not on file   Highest education level: Not on file  Occupational History   Not on file  Tobacco Use   Smoking status: Never   Smokeless tobacco: Never  Vaping Use   Vaping status: Never Used  Substance and Sexual Activity   Alcohol use: No   Drug use: No   Sexual activity: Never  Other Topics Concern   Not on file  Social History Narrative   ** Merged History Encounter **       Social Drivers of Health   Tobacco Use: Low Risk (06/13/2024)   Patient History    Smoking Tobacco Use: Never    Smokeless Tobacco Use: Never    Passive Exposure: Not on file  Financial Resource Strain: Not on file  Food Insecurity: Not on file  Transportation Needs: Not on file  Physical Activity: Not on file  Stress: Not on file  Social Connections: Not on file  Intimate Partner Violence: Not on file  Depression (EYV7-0): Not on file  Alcohol Screen: Not on file  Housing: Not on file  Utilities: Not on file  Health Literacy: Not on file    Review of Systems: Pertinent positive and negative review of systems were noted in the above HPI section.  All other review of systems was otherwise negative.   Physical Exam: Vital signs in last 24 hours: Temp:  [97.5 F (36.4 C)-97.7 F (36.5 C)] 97.7 F (36.5 C) (12/31 1221) Pulse Rate:  [79-90] 86 (12/31 1400) Resp:  [11-20] 11 (12/31 1400) BP: (90-113)/(48-76) 113/76 (12/31 1400) SpO2:  [94 %-100 %] 100 % (12/31 1400)   General:   Alert,  Well-developed, well-nourished, older African-American female pleasant and cooperative in NAD, friend at bedside Head:  Normocephalic and atraumatic. Eyes:  Sclera clear, no icterus.   Conjunctiva pink. Ears:  Normal auditory acuity. Nose:  No deformity, discharge,  or  lesions. Mouth:  No deformity or lesions.   Neck:  Supple; no masses or thyromegaly.  No JVD Lungs:  Clear throughout to auscultation.  No wheezes, crackles, or rhonchi.  No significant chest wall tenderness to palpation no significant pain to palpation of the left ribs/costal margin  Heart:  Regular rate and rhythm; no murmurs, clicks, rubs,  or gallops. Abdomen:  Soft,nontender, BS active,nonpalp mass or hsm.  Incisional port scars Rectal: Not done Msk:  Symmetrical without gross deformities. . Pulses:  Normal pulses noted. Extremities:  Without clubbing or edema. Neurologic:  Alert and  oriented x4;  grossly normal neurologically. Skin:  Intact without significant lesions or rashes.. Psych:  Alert and cooperative. Normal mood and affect.  Intake/Output from previous day: 12/30 0701 - 12/31 0700 In: 118 [P.O.:118] Out: -  Intake/Output this shift: No intake/output data recorded.  Lab Results: Recent Labs    06/13/24 1818 06/14/24 0354  WBC 7.2 9.6  HGB 12.5 12.8  HCT 39.1 38.4  PLT 203 186   BMET Recent Labs    06/13/24 1818 06/14/24 0354  NA 139 139  K 3.8 4.1  CL 103 103  CO2 26 25  GLUCOSE 155* 118*  BUN 22* 21*  CREATININE 1.10* 1.09*  CALCIUM  8.9 8.8*   LFT Recent Labs    06/14/24 0354  PROT 6.5  ALBUMIN 3.7  AST 859*  ALT 252*  ALKPHOS 326*  BILITOT 0.8   PT/INR No results for input(s): LABPROT, INR in the last 72 hours. Hepatitis Panel No results for input(s): HEPBSAG, HCVAB, HEPAIGM, HEPBIGM in the last 72 hours.    IMPRESSION:  #53 53 year old African-American female with history of nonischemic cardiomyopathy, recent echo with EF 55 to 60% who presents to the emergency room with complaints of a constant left-sided chest pressure radiating to the neck and into the left arm onset yesterday.  She had had a similar episode about 2 weeks ago that lasted for about 4 days, was constant and then resolved.  Initial ischemic workup in the  ER with flat troponins, no EKG changes Further cardiac workup pending  Patient also noted to have a transaminitis with significant increase in LFTs today. At this time it is unclear how the elevated LFTs are related to the prolonged chest pain episode.  Possible these are 2 separate processes  She is status post cholecystectomy, has not had any abdominal pain nausea change in appetite etc. with this episode. Unlikely that this represents choledocholithiasis, but needs to be ruled out.  Rule out viral syndrome, rule out Dili, drug-induced acute liver injury  #2 diabetes mellitus #3.  Fibromyalgia #4, sleep apnea #5.  Pretension #6.  Hyperlipidemia #7.  Bipolar disorder #8 status post Roux-en-Y gastric bypass  Plan; will check abdominal ultrasound Trend LFTs Sed rate, CRP, acetaminophen  level Acute hepatitis panel Cardiology consultation, if no further workup planned May need further imaging of her chest with CT GI will follow along with you  Amy Esterwood PA-C 06/14/2024, 5:04 PM  I have taken an interval history, thoroughly reviewed the chart and examined the patient. I agree with the Advanced Practitioner's note, impression and recommendations, and have recorded additional findings, impressions and recommendations below. I performed a substantive portion of this encounter (>50% time spent), including a complete performance of the medical decision making.  My additional thoughts are as follows:  It is good to characterize these pain complaints in the context of her chronic pain syndrome.  Which she is describing to us  does not sound typical for a biliary cause such as retained CBD stone.  In addition, the mixed picture LFTs with a marked transaminitis are also  not typical for CBD stone.  Nevertheless, that diagnosis must be considered and we are getting an abdominal ultrasound to see if there is biliary duct dilatation.  Patient is not reporting Tylenol  use, but she does have a  chronic pain syndrome and such patients might resort to those medicines at times, so we will check an acetaminophen  level (although we are now about 24 hours from her initial presentation)  Viral hepatitis panel and other workup in process.  If unrevealing and LFTs continue to rise, cross-sectional imaging of the chest and abdomen would be warranted. _________________  This consultation required a high degree of medical decision making due to the nature and complexity of the acute condition(s) being evaluated as well as the patient's medical comorbidities.  Victory LITTIE Brand III [phone]     "

## 2024-06-15 ENCOUNTER — Encounter (HOSPITAL_COMMUNITY): Payer: Self-pay | Admitting: Internal Medicine

## 2024-06-15 ENCOUNTER — Observation Stay (HOSPITAL_COMMUNITY)

## 2024-06-15 DIAGNOSIS — K5909 Other constipation: Secondary | ICD-10-CM | POA: Diagnosis not present

## 2024-06-15 DIAGNOSIS — R079 Chest pain, unspecified: Secondary | ICD-10-CM | POA: Diagnosis not present

## 2024-06-15 DIAGNOSIS — R7989 Other specified abnormal findings of blood chemistry: Secondary | ICD-10-CM | POA: Diagnosis not present

## 2024-06-15 LAB — GLUCOSE, CAPILLARY
Glucose-Capillary: 167 mg/dL — ABNORMAL HIGH (ref 70–99)
Glucose-Capillary: 126 mg/dL — ABNORMAL HIGH (ref 70–99)

## 2024-06-15 LAB — COMPREHENSIVE METABOLIC PANEL WITH GFR
ALT: 523 U/L — ABNORMAL HIGH (ref 0–44)
AST: 747 U/L — ABNORMAL HIGH (ref 15–41)
Albumin: 3.9 g/dL (ref 3.5–5.0)
Alkaline Phosphatase: 513 U/L — ABNORMAL HIGH (ref 38–126)
Anion gap: 9 (ref 5–15)
BUN: 25 mg/dL — ABNORMAL HIGH (ref 6–20)
CO2: 27 mmol/L (ref 22–32)
Calcium: 9.5 mg/dL (ref 8.9–10.3)
Chloride: 104 mmol/L (ref 98–111)
Creatinine, Ser: 1.21 mg/dL — ABNORMAL HIGH (ref 0.44–1.00)
GFR, Estimated: 53 mL/min — ABNORMAL LOW
Glucose, Bld: 172 mg/dL — ABNORMAL HIGH (ref 70–99)
Potassium: 4.2 mmol/L (ref 3.5–5.1)
Sodium: 139 mmol/L (ref 135–145)
Total Bilirubin: 0.6 mg/dL (ref 0.0–1.2)
Total Protein: 6.9 g/dL (ref 6.5–8.1)

## 2024-06-15 LAB — ACETAMINOPHEN LEVEL: Acetaminophen (Tylenol), Serum: <10 ug/mL — ABNORMAL LOW (ref 10–30)

## 2024-06-15 LAB — C-REACTIVE PROTEIN: CRP: 0.8 mg/dL

## 2024-06-15 LAB — CBG MONITORING, ED: Glucose-Capillary: 135 mg/dL — ABNORMAL HIGH (ref 70–99)

## 2024-06-15 LAB — HEPATITIS PANEL, ACUTE
HCV Ab: NONREACTIVE
Hep A IgM: NONREACTIVE
Hep B C IgM: NONREACTIVE
Hepatitis B Surface Ag: NONREACTIVE

## 2024-06-15 LAB — PROTIME-INR
INR: 1.1 (ref 0.8–1.2)
Prothrombin Time: 14.5 s (ref 11.4–15.2)

## 2024-06-15 LAB — SEDIMENTATION RATE: Sed Rate: 17 mm/h (ref 0–22)

## 2024-06-15 LAB — HEMOGLOBIN A1C
Hgb A1c MFr Bld: 7.6 % — ABNORMAL HIGH (ref 4.8–5.6)
Mean Plasma Glucose: 171.42 mg/dL

## 2024-06-15 MED ORDER — INSULIN ASPART 100 UNIT/ML IJ SOLN
0.0000 [IU] | Freq: Three times a day (TID) | INTRAMUSCULAR | Status: DC
  Administered 2024-06-15: 1 [IU] via SUBCUTANEOUS
  Administered 2024-06-15: 2 [IU] via SUBCUTANEOUS
  Administered 2024-06-16 – 2024-06-17 (×3): 1 [IU] via SUBCUTANEOUS
  Administered 2024-06-17: 2 [IU] via SUBCUTANEOUS
  Administered 2024-06-18: 1 [IU] via SUBCUTANEOUS
  Filled 2024-06-15: qty 1
  Filled 2024-06-15: qty 2
  Filled 2024-06-15 (×2): qty 1
  Filled 2024-06-15: qty 2
  Filled 2024-06-15: qty 1
  Filled 2024-06-15: qty 2

## 2024-06-15 MED ORDER — OXYCODONE HCL 5 MG PO TABS
10.0000 mg | ORAL_TABLET | Freq: Four times a day (QID) | ORAL | Status: DC | PRN
  Administered 2024-06-15 – 2024-06-18 (×8): 10 mg via ORAL
  Filled 2024-06-15 (×8): qty 2

## 2024-06-15 MED ORDER — IOHEXOL 350 MG/ML SOLN
75.0000 mL | Freq: Once | INTRAVENOUS | Status: AC | PRN
  Administered 2024-06-15: 75 mL via INTRAVENOUS

## 2024-06-15 NOTE — ED Notes (Signed)
 Patient c/o dizziness ambulating

## 2024-06-15 NOTE — Progress Notes (Signed)
 " PROGRESS NOTE  [name] FMW:[id number] DOB: 09/28/1970 DOA: 06/13/2024 PCP: Stephanie Charlene CROME, MD   LOS: 0 days   Brief Narrative / Interim history: 54 year old female with history of nonischemic cardiomyopathy, chronic systolic CHF (EF improved to 55-60% just last month from prior values of 20-25%), anemia, chronic pain, DM 2, HTN, HLD, obesity status post gastric bypass, who comes into the hospital with chest pain.  Incidentally she was found to have fairly significant LFTs and GI was consulted  Subjective / 24h Interval events: She denies any chest pain this morning.  She denies any abdominal pain, no nausea or vomiting.  Just feels weak  Assesement and Plan: Principal problem Elevated LFTs - GI consulted, discussed with Dr. Legrand and etiology is not entirely clear.  LFTs are still elevated today with AST being slightly better but ALT actually doubled since yesterday.  T. bili is normal.  Right upper quadrant ultrasound is fairly unremarkable - Acute hepatitis panel is negative.  CT scan of the chest abdomen and pelvis is pending - She is on Percocet at home, Tylenol  level was checked and it was found to be undetectably low.  Inflammatory markers unremarkable with normal sed rate - She denies being on nonprescription supplements/herbs.  Continue to closely monitor LFTs  Active problem Chest pain -has been having chest pain like this in the past.  She ruled out for ACS with negative cardiac markers.  She has had a 2D echo about a week ago showing normal EF 55 to 60%, no wall motion abnormalities, grade 1 diastolic dysfunction.  RV systolic function and ventricular size were normal - Informally discussed with Dr. Rolan as he is her cardiologist, this is less likely ACS  Chronic systolic CHF-EF is now recovered.  Continue home medication as below with Entresto , empagliflozin, metoprolol , torsemide .  Hold statin due to elevation in the LFTs  Chronic pain -has been getting regular  prescriptions from 1 provider, database reviewed.  She has chronic back pain and had 2 surgeries in the past.  Resume oxycodone , avoid Percocet that has acetaminophen  in its formulation  Essential hypertension-continue medications as below, blood pressure is stable  History of gastric bypass -noted  DM2, with hyperglycemia-last A1c in our system is in 2020.  She appears to be only on Jardiance at home.  She was hyperglycemic on earlier BMPs, start sliding scale  Scheduled Meds:  DULoxetine   90 mg Oral Daily   empagliflozin  25 mg Oral q AM   famotidine   20 mg Oral BID   fluticasone   2 spray Each Nare Daily   metoprolol  succinate  25 mg Oral q AM   montelukast   10 mg Oral Daily   pantoprazole   40 mg Oral QHS   pregabalin   150 mg Oral TID   sacubitril -valsartan   1 tablet Oral BID   spironolactone   25 mg Oral Daily   torsemide   40 mg Oral BID   Continuous Infusions: PRN Meds:.acetaminophen  **OR** acetaminophen , alum & mag hydroxide-simeth, melatonin, naLOXone (NARCAN)  injection, ondansetron  (ZOFRAN ) IV, oxyCODONE , promethazine   Current Outpatient Medications  Medication Instructions   albuterol  (VENTOLIN  HFA) 108 (90 Base) MCG/ACT inhaler 2 puffs, Inhalation, Every 4 hours PRN   Biotin  (BIOTIN  EXTRA STRENGTH) 10 mg, Oral, Every morning   cloNIDine (CATAPRES) 0.1 mg, Oral, Daily at bedtime   DULoxetine  (CYMBALTA ) 60 mg, Oral, Daily   DULoxetine  (CYMBALTA ) 30 mg, Oral, Daily   empagliflozin (JARDIANCE) 25 mg, Oral, Every morning   famotidine  (PEPCID ) 20 mg, Oral,  2 times daily   fluticasone  (FLONASE ) 50 MCG/ACT nasal spray 2 sprays, Each Nare, Daily   metoprolol  succinate (TOPROL -XL) 25 mg, Oral, Every morning   montelukast  (SINGULAIR ) 10 mg, Oral, Daily   Mounjaro 15 mg, Subcutaneous, Every Sat   ondansetron  (ZOFRAN ) 4 MG tablet TAKE 1 TABLET BY MOUTH EVERY 8 HOURS AS NEEDED FOR NAUSEA AND VOMITING   oxyCODONE -acetaminophen  (PERCOCET) 10-325 MG per tablet 0.5-1 tablets, Oral, Every  4 hours PRN   potassium chloride  (KLOR-CON ) 10 MEQ tablet TAKE 6 TABLETS BY MOUTH 2 (TWO) TIMES DAILY FOR 3 DAYS, THEN 6 TABLETS (60 MEQ TOTAL) DAILY.   pregabalin  (LYRICA ) 150 mg, Oral, 3 times daily   promethazine  (PHENERGAN ) 25 MG tablet promethazine  25 mg tablet  TAKE 1/2 TO 1 TABLET BY MOUTH EVERY 6 HOURS AS NEEDED FOR NAUSEA   rosuvastatin  (CRESTOR ) 10 mg, Oral, Daily   sacubitril -valsartan  (ENTRESTO ) 24-26 MG 1 tablet, Oral, 2 times daily   solifenacin (VESICARE) 5 MG tablet solifenacin 5 mg tablet  TAKE 1 (ONE) TABLET DAILY FOR BLADDER CONTROL   spironolactone  (ALDACTONE ) 25 mg, Oral, Daily   torsemide  (DEMADEX ) 20 MG tablet TAKE 2 TABLETS BY MOUTH 2 (TWO) TIMES DAILY FOR 3 DAYS, THEN 1 TABLET (20 MG TOTAL) 2 TIMES DAILY.   Vitamin D3 5,000 Units, Oral, Every morning    Diet Orders (From admission, onward)     Start     Ordered   06/14/24 1326  Diet regular Room service appropriate? Yes; Fluid consistency: Thin  Diet effective now       Question Answer Comment  Room service appropriate? Yes   Fluid consistency: Thin      06/14/24 1325            DVT prophylaxis: SCDs Start: 06/14/24 0001   Lab Results  Component Value Date   PLT 186 06/14/2024      Code Status: Full Code  Family Communication: No family at bedside  Status is: Observation The patient will require care spanning > 2 midnights and should be moved to inpatient because: Elevated LFTs  Level of care: Telemetry  Consultants:  Gastroenterology  Objective: Vitals:   06/15/24 0615 06/15/24 9367 06/15/24 0638 06/15/24 0645  BP: (!) 107/90  (!) 107/90 109/75  Pulse: 89  89 84  Resp: 13   13  Temp:  98.5 F (36.9 C)    TempSrc:  Oral    SpO2: 97%   100%   No intake or output data in the 24 hours ending 06/15/24 1042 Wt Readings from Last 3 Encounters:  06/05/24 71.8 kg  03/21/24 72.2 kg  01/18/24 76.7 kg    Examination:  Constitutional: NAD Eyes: no scleral icterus ENMT: Mucous  membranes are moist.  Neck: normal, supple Respiratory: clear to auscultation bilaterally, no wheezing, no crackles. Normal respiratory effort. No accessory muscle use.  Cardiovascular: Regular rate and rhythm, no murmurs / rubs / gallops. No LE edema.  Abdomen: non distended, no tenderness. Bowel sounds positive.  Musculoskeletal: no clubbing / cyanosis.   Data Reviewed: I have independently reviewed following labs and imaging studies   CBC Recent Labs  Lab 06/13/24 1818 06/14/24 0354  WBC 7.2 9.6  HGB 12.5 12.8  HCT 39.1 38.4  PLT 203 186  MCV 93.5 91.0  MCH 29.9 30.3  MCHC 32.0 33.3  RDW 12.2 12.3  LYMPHSABS 2.7 2.5  MONOABS 0.6 0.8  EOSABS 0.3 0.3  BASOSABS 0.0 0.0    Recent Labs  Lab 06/13/24 1818  06/13/24 2046 06/14/24 0354 06/15/24 0216  NA 139  --  139 139  K 3.8  --  4.1 4.2  CL 103  --  103 104  CO2 26  --  25 27  GLUCOSE 155*  --  118* 172*  BUN 22*  --  21* 25*  CREATININE 1.10*  --  1.09* 1.21*  CALCIUM  8.9  --  8.8* 9.5  AST 85*  --  859* 747*  ALT 71*  --  252* 523*  ALKPHOS 178*  --  326* 513*  BILITOT 0.4  --  0.8 0.6  ALBUMIN 3.8  --  3.7 3.9  MG  --  2.6*  --   --   CRP  --   --   --  0.8  INR  --   --   --  1.1    ------------------------------------------------------------------------------------------------------------------ No results for input(s): CHOL, HDL, LDLCALC, TRIG, CHOLHDL, LDLDIRECT in the last 72 hours.  Lab Results  Component Value Date   HGBA1C 9.7 (H) 08/03/2018   ------------------------------------------------------------------------------------------------------------------ No results for input(s): TSH, T4TOTAL, T3FREE, THYROIDAB in the last 72 hours.  Invalid input(s): FREET3  Cardiac Enzymes No results for input(s): CKMB, TROPONINI, MYOGLOBIN in the last 168 hours.  Invalid input(s):  CK ------------------------------------------------------------------------------------------------------------------    Component Value Date/Time   BNP 100.4 (H) 06/17/2023 1027    CBG: No results for input(s): GLUCAP in the last 168 hours.  No results found for this or any previous visit (from the past 240 hours).   Radiology Studies: US  Abdomen Complete Result Date: 06/14/2024 EXAM: COMPLETE ABDOMINAL ULTRASOUND TECHNIQUE: Real-time ultrasonography of the abdomen was performed. COMPARISON: US  Abdomen Limited 08/01/2018. CLINICAL HISTORY: Elevated liver function tests and chest pain. FINDINGS: LIVER: Hepatic parenchyma increased in echogenicity. No intrahepatic biliary ductal dilatation. No mass. BILIARY SYSTEM: Status post cholecystectomy. Negative sonographic Murphy sign. Common bile duct measures 2 mm. KIDNEYS: The right kidney measures 9 cm in length. The left kidney measures 10 cm in length. Normal contour of kidneys. Normal cortical echogenicity. Question trace proximal left hydroureter. No calculus. No mass. PANCREAS: Limited evaluation of the pancreas due to overlying bowel. SPLEEN: The spleen is normal in size with no focal splenic lesion. Spleen length measures 8.7 cm. VESSELS: Visualized portion of the aorta is normal. Limited evaluation of the inferior vena cava due to overlying bowel. Hepatopetal flow in the portal vein. OTHER: No ascites. IMPRESSION: 1. Question trace proximal left hydroureter. 2. Status post cholecystectomy. Electronically signed by: Morgane Naveau MD 06/14/2024 07:43 PM EST RP Workstation: [id number]     Nilda Fendt, MD, PhD [facility]  Between 7 am - 7 pm I am available, please contact me via Amion (for emergencies) or Securechat (non urgent messages)  Between 7 pm - 7 am I am not available, please contact night coverage MD/APP via Amion  "

## 2024-06-15 NOTE — ED Notes (Signed)
 RN perform orthostatic standing patient c/o she felt dizzy.

## 2024-06-15 NOTE — ED Notes (Signed)
Patient was given something to drink.  

## 2024-06-15 NOTE — Evaluation (Signed)
 Physical Therapy Evaluation Patient Details Name: [name] MRN: [id number] DOB: 15-Feb-1971 Today's Date: 06/15/2024  History of Present Illness  54 y.o. F adm 06/13/24 with acute chest pain, transaminitis. PMhx; NICM, HFimpEF, anemia, DM, HTN, HLD, depression, fibromyalgia, asthma, bipolar d/o, OSA not on CPAP, and obesity s/p gastric bypass  Clinical Impression  Pt pleasant, lives with mom, reports 2 recent falls and that she tries not do do too much activity to aggravate her pain. Pt with unsteady gait and benefits from RW to achieve mod I stability during activity. Pt with decreased balance and activity tolerance who will benefit from acute therapy and OPPT to address balance deficits and maximize independence.    BP 94/70 in standing 114/88 sitting    If plan is discharge home, recommend the following: A little help with walking and/or transfers   Can travel by private vehicle        Equipment Recommendations Rolling walker (2 wheels)  Recommendations for Other Services       Functional Status Assessment Patient has had a recent decline in their functional status and demonstrates the ability to make significant improvements in function in a reasonable and predictable amount of time.     Precautions / Restrictions Precautions Precautions: Fall Recall of Precautions/Restrictions: Intact      Mobility  Bed Mobility Overal bed mobility: Modified Independent                  Transfers Overall transfer level: Modified independent                      Ambulation/Gait Ambulation/Gait assistance: Contact guard assist Gait Distance (Feet): 500 Feet Assistive device: None, Rolling walker (2 wheels), 1 person hand held assist Gait Pattern/deviations: Step-through pattern, Decreased stride length   Gait velocity interpretation: 1.31 - 2.62 ft/sec, indicative of limited community ambulator   General Gait Details: pt initially walking 200' without UB  support with unsteady gait and need for tactile cue and min assist at times to steady gait. With Rw pt mod I with gait without need for additional physical assist. grossly 100' with single hand held assist to simulate cane pt with continued Unsteady gait and physical assist  Stairs            Wheelchair Mobility     Tilt Bed    Modified Rankin (Stroke Patients Only)       Balance Overall balance assessment: Needs assistance Sitting-balance support: Bilateral upper extremity supported Sitting balance-Leahy Scale: Poor Sitting balance - Comments: RW for gait to achieve mod I                                     Pertinent Vitals/Pain Pain Assessment Pain Assessment: No/denies pain    Home Living Family/patient expects to be discharged to:: Private residence Living Arrangements: Parent Available Help at Discharge: Family;Available 24 hours/day Type of Home: House Home Access: Stairs to enter Entrance Stairs-Rails: Right Entrance Stairs-Number of Steps: 6   Home Layout: One level Home Equipment: None      Prior Function Prior Level of Function : Independent/Modified Independent;Driving                     Extremity/Trunk Assessment   Upper Extremity Assessment Upper Extremity Assessment: Overall WFL for tasks assessed    Lower Extremity Assessment Lower Extremity Assessment: Overall WFL for tasks assessed  Cervical / Trunk Assessment Cervical / Trunk Assessment: Normal  Communication   Communication Communication: No apparent difficulties    Cognition Arousal: Alert Behavior During Therapy: WFL for tasks assessed/performed   PT - Cognitive impairments: Safety/Judgement                       PT - Cognition Comments: unsteady gait with decreased awareness, 2 recent falls Following commands: Intact       Cueing Cueing Techniques: Verbal cues     General Comments      Exercises     Assessment/Plan    PT  Assessment Patient needs continued PT services  PT Problem List Decreased activity tolerance;Decreased balance;Decreased knowledge of use of DME       PT Treatment Interventions DME instruction;Gait training;Balance training;Stair training;Functional mobility training;Therapeutic activities;Patient/family education;Therapeutic exercise;Neuromuscular re-education    PT Goals (Current goals can be found in the Care Plan section)  Acute Rehab PT Goals Patient Stated Goal: return home PT Goal Formulation: With patient Time For Goal Achievement: 06/29/24 Potential to Achieve Goals: Fair    Frequency Min 1X/week     Co-evaluation               AM-PAC PT 6 Clicks Mobility  Outcome Measure Help needed turning from your back to your side while in a flat bed without using bedrails?: None Help needed moving from lying on your back to sitting on the side of a flat bed without using bedrails?: None Help needed moving to and from a bed to a chair (including a wheelchair)?: A Little Help needed standing up from a chair using your arms (e.g., wheelchair or bedside chair)?: A Little Help needed to walk in hospital room?: A Little Help needed climbing 3-5 steps with a railing? : A Little 6 Click Score: 20    End of Session Equipment Utilized During Treatment: Gait belt Activity Tolerance: Patient tolerated treatment well Patient left: in bed;with call bell/phone within reach Nurse Communication: Mobility status PT Visit Diagnosis: Other abnormalities of gait and mobility (R26.89);History of falling (Z91.81)    Time: 8672-8648 PT Time Calculation (min) (ACUTE ONLY): 24 min   Charges:   PT Evaluation $PT Eval Moderate Complexity: 1 Mod   PT General Charges $$ ACUTE PT VISIT: 1 Visit         [name], PT Acute Rehabilitation Services Office: [phone]   [name] [name] 06/15/2024, 2:07 PM

## 2024-06-15 NOTE — Progress Notes (Addendum)
 Patient ID: [name], female   DOB: 04-May-1971, 54 y.o.   MRN: [id number]    Progress Note   Subjective   Day # 2 CC; left-sided chest pain, into left neck and arm   Afebrile  Abdominal ultrasound-patient's status post cholecystectomy CBD 2 mm, question trace proximal left hydroureter  Labs-sed rate 17/CRP 0.8 Acetaminophen  level less than 10 Acute hepatitis panel negative INR 1.1 BUN 25/creatinine 1.21 LFTs-T. bili 0.6/AST 747/ALT 523/alk phos 513  Patient had just been up to the bathroom, says she feels a little bit weak and lightheaded with walking.  Also mentioned that she had been having some episodes at home with bending over causing lightheadedness/dizziness and also states she had had 2 falls at home about 2 weeks ago 1 of those she landed on her right side and 1 she hit her head but did not syncopize No complaints of abdominal pain, no nausea or vomiting, she says that her left-sided chest pain seems to be improving/resolving as of today   Objective   Vital signs in last 24 hours: Temp:  [97.7 F (36.5 C)-98.5 F (36.9 C)] 98.5 F (36.9 C) (01/01 0632) Pulse Rate:  [84-91] 84 (01/01 0645) Resp:  [11-17] 13 (01/01 0645) BP: (98-164)/(48-96) 109/75 (01/01 0645) SpO2:  [94 %-100 %] 100 % (01/01 0645)   General:    African-American female in NAD-somnolent during the conversation this morning Heart:  Regular rate and rhythm; no murmurs Lungs: Respirations even and unlabored, lungs CTA bilaterally, no significant appreciable chest wall tenderness Abdomen:  Soft, minimal tenderness with deep palpation of right upper quadrant, no guarding or rebound nondistended. Normal bowel sounds. Extremities:  Without edema. Neurologic:  Alert and oriented,  grossly normal neurologically. Psych:  Cooperative. Normal mood and affect.  Intake/Output from previous day: No intake/output data recorded. Intake/Output this shift: No intake/output data recorded.  Lab  Results: Recent Labs    06/13/24 1818 06/14/24 0354  WBC 7.2 9.6  HGB 12.5 12.8  HCT 39.1 38.4  PLT 203 186   BMET Recent Labs    06/13/24 1818 06/14/24 0354 06/15/24 0216  NA 139 139 139  K 3.8 4.1 4.2  CL 103 103 104  CO2 26 25 27   GLUCOSE 155* 118* 172*  BUN 22* 21* 25*  CREATININE 1.10* 1.09* 1.21*  CALCIUM  8.9 8.8* 9.5   LFT Recent Labs    06/15/24 0216  PROT 6.9  ALBUMIN 3.9  AST 747*  ALT 523*  ALKPHOS 513*  BILITOT 0.6   PT/INR Recent Labs    06/15/24 0216  LABPROT 14.5  INR 1.1    Acute viral hepatitis panel negative  Studies/Results: US  Abdomen Complete Result Date: 06/14/2024 EXAM: COMPLETE ABDOMINAL ULTRASOUND TECHNIQUE: Real-time ultrasonography of the abdomen was performed. COMPARISON: US  Abdomen Limited 08/01/2018. CLINICAL HISTORY: Elevated liver function tests and chest pain. FINDINGS: LIVER: Hepatic parenchyma increased in echogenicity. No intrahepatic biliary ductal dilatation. No mass. BILIARY SYSTEM: Status post cholecystectomy. Negative sonographic Murphy sign. Common bile duct measures 2 mm. KIDNEYS: The right kidney measures 9 cm in length. The left kidney measures 10 cm in length. Normal contour of kidneys. Normal cortical echogenicity. Question trace proximal left hydroureter. No calculus. No mass. PANCREAS: Limited evaluation of the pancreas due to overlying bowel. SPLEEN: The spleen is normal in size with no focal splenic lesion. Spleen length measures 8.7 cm. VESSELS: Visualized portion of the aorta is normal. Limited evaluation of the inferior vena cava due to overlying bowel. Hepatopetal flow in  the portal vein. OTHER: No ascites. IMPRESSION: 1. Question trace proximal left hydroureter. 2. Status post cholecystectomy. Electronically signed by: Morgane Naveau MD 06/14/2024 07:43 PM EST RP Workstation: [id number]   DG Chest 2 View Result Date: 06/13/2024 CLINICAL DATA:  Chest pain. EXAM: CHEST - 2 VIEW COMPARISON:  02/10/2023 FINDINGS:  The cardiac silhouette, mediastinal and hilar contours are within normal limits. The lungs are clear. No infiltrates, edema or effusions. No pulmonary lesions. No pneumothorax. The bony thorax is intact. Stable thoracic spinal cord stimulator. IMPRESSION: No acute cardiopulmonary findings. Electronically Signed   By: MYRTIS Stammer M.D.   On: 06/13/2024 19:49       Assessment / Plan:    #1 55 year old female with history of nonischemic cardiomyopathy, very recent echo with EF 55 to 60%, history of diabetes mellitus, hypertension, fibromyalgia, bipolar disorder, remote Roux-en-Y gastric bypass and prior cholecystectomy  Admitted after she presented on 06/13/2024 with complaints of chest pressure associated with mild shortness of breath, she had had a similar episode 2 weeks previous that lasted for about 4 days and then resolved.  Has ruled out for acute coronary syndrome, had very recent echo  #2 acute elevation of LFTs, transaminases continue to rise. Abdominal ultrasound does not show any ductal dilation Etiology of the LFT elevation is not clear at this time aCute hepatitis panel negative, acetaminophen  level negative, inflammatory markers normal  Denies any new medications, over-the-counter supplements etc.  #3 chronic pain syndrome #4 history of hypertension-may be having episodes of orthostasis at home  Plan; OK for regular diet Will proceed with CT of the chest abdomen and pelvis today Continue to trend LFTs Further recommendations pending results of CT.      Principal Problem:   Chest pain Active Problems:   Elevated LFTs     LOS: 0 days   Amy EsterwoodPA-C  06/15/2024, 8:31 AM  I have taken an interval history, thoroughly reviewed the chart and examined the patient. I agree with the Advanced Practitioner's note, impression and recommendations, and have recorded additional findings, impressions and recommendations below. I performed a substantive portion of this  encounter (>50% time spent), including a complete performance of the medical decision making.  My additional thoughts are as follows:  Continues to have left-sided chest/shoulder pain that is difficult to characterize and also still seems unrelated to her elevated LFTs. She is also describing recent episodes of syncope that occurred after bending over and feeling lightheaded, both of which led to falls.  Questioned her again about her medicines, she does not think that she is taking anything different or new in recent months.  She also tells us  that she does not take more than a few Percocet tablets a day and has not been using any additional OTC pain meds, vitamins, supplements or anything else purchased online.  Her clinical parameters do not suggest infection, no evidence of biliary ductal dilatation on ultrasound  We obtained CT scans of the chest abdomen and pelvis to check for neoplastic, inflammatory or vascular causes with none found.  No evidence of choledocholithiasis. No evidence of hematoma or hepatic trauma given her report of recent falls.  She is not immunocompromised to suggest unusual viral infections causing these elevated LFTs.  (I.e. CMV, HSV) Acetaminophen  level negative  The pattern of LFTs with the remainder of test negative thus far suggest the possibility of toxin/drug-induced liver injury.  While she did have some episodes of reported syncope could have been hypotensive prior to hospitalization, the pattern of  LFTs does not fit with ischemic hepatopathy.   I would manage this expectantly right now by following the LFTs and treat her pain complaints symptomatically.  Lastly, consider the possibility that this patient with a diagnosis of fibromyalgia could have an inflammatory rheumatologic condition (i.e. lupus, dermatomyositis, polymyositis), and consider getting workup directed at that (i.e. aldolase, LDH, CK, ANA as appropriate) _________________  This consultation  required a moderate degree of medical decision making due to the nature and complexity of the acute condition(s) being evaluated as well as the patient's medical comorbidities.  Victory LITTIE Brand III Office:[phone]

## 2024-06-16 DIAGNOSIS — K5909 Other constipation: Secondary | ICD-10-CM

## 2024-06-16 DIAGNOSIS — R7989 Other specified abnormal findings of blood chemistry: Secondary | ICD-10-CM | POA: Diagnosis not present

## 2024-06-16 LAB — CBC
HCT: 40.8 % (ref 36.0–46.0)
Hemoglobin: 13.3 g/dL (ref 12.0–15.0)
MCH: 30.6 pg (ref 26.0–34.0)
MCHC: 32.6 g/dL (ref 30.0–36.0)
MCV: 93.8 fL (ref 80.0–100.0)
Platelets: 186 K/uL (ref 150–400)
RBC: 4.35 MIL/uL (ref 3.87–5.11)
RDW: 12.6 % (ref 11.5–15.5)
WBC: 6.4 K/uL (ref 4.0–10.5)
nRBC: 0 % (ref 0.0–0.2)

## 2024-06-16 LAB — COMPREHENSIVE METABOLIC PANEL WITH GFR
ALT: 324 U/L — ABNORMAL HIGH (ref 0–44)
AST: 186 U/L — ABNORMAL HIGH (ref 15–41)
Albumin: 3.6 g/dL (ref 3.5–5.0)
Alkaline Phosphatase: 423 U/L — ABNORMAL HIGH (ref 38–126)
Anion gap: 10 (ref 5–15)
BUN: 31 mg/dL — ABNORMAL HIGH (ref 6–20)
CO2: 25 mmol/L (ref 22–32)
Calcium: 9.2 mg/dL (ref 8.9–10.3)
Chloride: 103 mmol/L (ref 98–111)
Creatinine, Ser: 1.5 mg/dL — ABNORMAL HIGH (ref 0.44–1.00)
GFR, Estimated: 41 mL/min — ABNORMAL LOW
Glucose, Bld: 127 mg/dL — ABNORMAL HIGH (ref 70–99)
Potassium: 4.2 mmol/L (ref 3.5–5.1)
Sodium: 138 mmol/L (ref 135–145)
Total Bilirubin: 0.4 mg/dL (ref 0.0–1.2)
Total Protein: 6.5 g/dL (ref 6.5–8.1)

## 2024-06-16 LAB — MAGNESIUM: Magnesium: 2.6 mg/dL — ABNORMAL HIGH (ref 1.7–2.4)

## 2024-06-16 LAB — GLUCOSE, CAPILLARY
Glucose-Capillary: 126 mg/dL — ABNORMAL HIGH (ref 70–99)
Glucose-Capillary: 143 mg/dL — ABNORMAL HIGH (ref 70–99)
Glucose-Capillary: 84 mg/dL (ref 70–99)
Glucose-Capillary: 412 mg/dL — ABNORMAL HIGH (ref 70–99)

## 2024-06-16 MED ORDER — BISACODYL 5 MG PO TBEC: 10.0000 mg | DELAYED_RELEASE_TABLET | Freq: Every day | ORAL | Status: DC | PRN

## 2024-06-16 MED ORDER — POLYETHYLENE GLYCOL 3350 17 G PO PACK
17.0000 g | PACK | Freq: Two times a day (BID) | ORAL | Status: DC
  Administered 2024-06-16 – 2024-06-17 (×2): 17 g via ORAL
  Filled 2024-06-16 (×4): qty 1

## 2024-06-16 NOTE — Progress Notes (Signed)
 [facility] Progress Note  Chief Complaint: Elevated LFTs and chronic constipation  History:  Patient reports no BM since prior to admission. Still has some left upper chest/shoulder pain such as what brought her in.  Still feels intermittently dizzy when sitting up or standing and has been found to be orthostatic with some subsequent medicine changes.  (Case discussed with Dr. Irean) Tolerating regular diet without nausea vomiting or dysphagia    Objective:  Current Medications[1]     Vital signs in last 24 hrs: Vitals:   06/16/24 0451 06/16/24 0907  BP: 119/80 (!) 127/90  Pulse: 81 82  Resp:    Temp: 98.1 F (36.7 C) 97.7 F (36.5 C)  SpO2: 99% 97%    Intake/Output Summary (Last 24 hours) at 06/16/2024 1634 Last data filed at 06/16/2024 0451 Gross per 24 hour  Intake 270 ml  Output 0 ml  Net 270 ml     Physical Exam  Sitting on edge of bed.  Conversational.  Sometimes starts nodding off during conversation as occurred when we saw her yesterday Cardiac: RRR without murmurs, S1S2 heard, no peripheral edema Pulm: clear to auscultation bilaterally, normal RR and effort noted Abdomen: soft, no tenderness, with active bowel sounds. No guarding or palpable hepatosplenomegaly Skin; warm and dry, no jaundice  Recent Labs:     Latest Ref Rng & Units 06/16/2024    3:50 AM 06/14/2024    3:54 AM 06/13/2024    6:18 PM  CBC  WBC 4.0 - 10.5 K/uL 6.4  9.6  7.2   Hemoglobin 12.0 - 15.0 g/dL 86.6  87.1  87.4   Hematocrit 36.0 - 46.0 % 40.8  38.4  39.1   Platelets 150 - 400 K/uL 186  186  203     Recent Labs  Lab 06/15/24 0216  INR 1.1      Latest Ref Rng & Units 06/16/2024    3:50 AM 06/15/2024    2:16 AM 06/14/2024    3:54 AM  CMP  Glucose 70 - 99 mg/dL 872  827  881   BUN 6 - 20 mg/dL 31  25  21    Creatinine 0.44 - 1.00 mg/dL 8.49  8.78  8.90   Sodium 135 - 145 mmol/L 138  139  139   Potassium 3.5 - 5.1 mmol/L 4.2  4.2  4.1   Chloride 98 - 111 mmol/L 103  104  103    CO2 22 - 32 mmol/L 25  27  25    Calcium  8.9 - 10.3 mg/dL 9.2  9.5  8.8   Total Protein 6.5 - 8.1 g/dL 6.5  6.9  6.5   Total Bilirubin 0.0 - 1.2 mg/dL 0.4  0.6  0.8   Alkaline Phos 38 - 126 U/L 423  513  326   AST 15 - 41 U/L 186  747  859   ALT 0 - 44 U/L 324  523  252    Lab Results  Component Value Date   INR 1.1 06/15/2024   INR 1.0 11/02/2013   INR 0.94 03/14/2012     Radiologic studies:   Assessment & Plan  Assessment:  Chronic constipation Chest pain-noncardiac and not GI related.  Seems likely related to the recent fall she has described that have occurred most likely as a result of orthostatic hypotension.  Elevated LFTs with a mixed cholestatic and hepatocellular pattern.  Could have been some toxic/metabolic/D ILI, but no clear culprits. Now suspect it was likely an atypical pattern  of ischemic hepatopathy from intermittent hypotension.  LFTs improving, no coagulopathy.   Plan:  hepatic function panel every other day while here These to be followed in the outpatient setting to ensure resolution She needs clinic follow-up with us  regarding her chronic constipation and need for a colonoscopy with extended bowel prep For the current constipation, will prescribe Dulcolax 10 mg by mouth and MiraLAX  17 g twice daily.  (Chronic opioid-induced constipation)  Inpatient GI service signing off, call again if needed.  This encounter required a moderate degree of medical decision making.   Victory LITTIE Brand III Office: [phone]     [1]  Current Facility-Administered Medications:    acetaminophen  (TYLENOL ) tablet 650 mg, 650 mg, Oral, Q6H PRN, 650 mg at 06/15/24 2248 **OR** acetaminophen  (TYLENOL ) suppository 650 mg, 650 mg, Rectal, Q6H PRN, Howerter, Justin B, DO   alum & mag hydroxide-simeth (MAALOX/MYLANTA) 200-200-20 MG/5ML suspension 15 mL, 15 mL, Oral, Q6H PRN, Howerter, Justin B, DO   DULoxetine  (CYMBALTA ) DR capsule 90 mg, 90 mg, Oral, Daily, Georgina Basket,  MD, 90 mg at 06/16/24 9095   empagliflozin (JARDIANCE) tablet 25 mg, 25 mg, Oral, q AM, Georgina Basket, MD, 25 mg at 06/16/24 9382   famotidine  (PEPCID ) tablet 20 mg, 20 mg, Oral, BID, Georgina Basket, MD, 20 mg at 06/16/24 9095   fluticasone  (FLONASE ) 50 MCG/ACT nasal spray 2 spray, 2 spray, Each Nare, Daily, Georgina Basket, MD, 2 spray at 06/16/24 9095   insulin  aspart (novoLOG ) injection 0-9 Units, 0-9 Units, Subcutaneous, TID WC, [name], [name], MD, 1 Units at 06/16/24 1143   melatonin tablet 3 mg, 3 mg, Oral, QHS PRN, Howerter, Justin B, DO, 3 mg at 06/15/24 2247   metoprolol  succinate (TOPROL -XL) 24 hr tablet 25 mg, 25 mg, Oral, q AM, Georgina Basket, MD, 25 mg at 06/16/24 0617   montelukast  (SINGULAIR ) tablet 10 mg, 10 mg, Oral, Daily, Georgina Basket, MD, 10 mg at 06/16/24 0903   naloxone (NARCAN) injection 0.4 mg, 0.4 mg, Intravenous, PRN, Howerter, Justin B, DO   ondansetron  (ZOFRAN ) injection 4 mg, 4 mg, Intravenous, Q6H PRN, Howerter, Justin B, DO   oxyCODONE  (Oxy IR/ROXICODONE ) immediate release tablet 10 mg, 10 mg, Oral, Q6H PRN, [name], [name], MD, 10 mg at 06/16/24 1515   pantoprazole  (PROTONIX ) EC tablet 40 mg, 40 mg, Oral, QHS, Georgina Basket, MD, 40 mg at 06/15/24 2248   pregabalin  (LYRICA ) capsule 150 mg, 150 mg, Oral, TID, Georgina Basket, MD, 150 mg at 06/16/24 9095   promethazine  (PHENERGAN ) tablet 25 mg, 25 mg, Oral, Q6H PRN, Georgina Basket, MD, 25 mg at 06/15/24 1755

## 2024-06-16 NOTE — TOC Initial Note (Signed)
 Transition of Care [facility]) - Initial/Assessment Note    Patient Details  Name: [name] MRN: [id number] Date of Birth: 07/02/1970  [facility]) CM/SW Contact:    Lendia Dais, LCSWA Phone Number: 06/16/2024, 3:55 PM  Clinical Narrative: Pt is from home w/ mother. Pt is indep w/ ADL's, has seen PCP, drives, and has no DME. Pt reports no HCPOA and was agreeable to info being given. Spiritual consult placed.  Pt receives disability as income and states that she struggles at times to make ends meet. CSW offered utilities and the pt was agreeable. Resources in AVS.   Pt has no hx of HH/STR or MH/SU.  No current TOC needs. Please place consult for any further needs.                   Expected Discharge Plan: Home/Self Care Barriers to Discharge: Continued Medical Work up   Patient Goals and CMS Choice     Choice offered to / list presented to : NA      Expected Discharge Plan and Services In-house Referral: Clinical Social Work, Chaplain     Living arrangements for the past 2 months: Single Family Home                                      Prior Living Arrangements/Services Living arrangements for the past 2 months: Single Family Home Lives with:: Parents Patient language and need for interpreter reviewed:: No Do you feel safe going back to the place where you live?: Yes      Need for Family Participation in Patient Care: No (Comment) Care giver support system in place?: No (comment) Current home services: DME Criminal Activity/Legal Involvement Pertinent to Current Situation/Hospitalization: No - Comment as needed  Activities of Daily Living   ADL Screening (condition at time of admission) Independently performs ADLs?: Yes (appropriate for developmental age) Is the patient deaf or have difficulty hearing?: No Does the patient have difficulty seeing, even when wearing glasses/contacts?: Yes Does the patient have difficulty concentrating,  remembering, or making decisions?: Yes  Permission Sought/Granted Permission sought to share information with : Family Supports Permission granted to share information with : Yes, Verbal Permission Granted  Share Information with NAME: [facility]     Permission granted to share info w Relationship: Mom  Permission granted to share info w Contact Information: [phone]  Emotional Assessment Appearance:: Appears stated age Attitude/Demeanor/Rapport: Engaged Affect (typically observed): Pleasant Orientation: : Oriented to Self, Oriented to Situation, Oriented to Place, Oriented to  Time Alcohol / Substance Use: Not Applicable Psych Involvement: No (comment)  Admission diagnosis:  Elevated LFTs [R79.89] Chest pain [R07.9] Chest pain, unspecified type [R07.9] Patient Active Problem List   Diagnosis Date Noted   Chest pain 06/13/2024   Abnormal weight gain 11/29/2020   Absolute anemia 11/29/2020   Accumulation of fluid in tissues 11/29/2020   Acid reflux 11/29/2020   Acute infection of nasal sinus 11/29/2020   Adverse effect of angiotensin-converting enzyme inhibitor 11/29/2020   AF (amaurosis fugax) 11/29/2020   Allergic rhinitis 11/29/2020   Ankle ulcer (HCC) 11/29/2020   Asthma, mild intermittent 11/29/2020   Avitaminosis D 11/29/2020   Paresthesia 11/29/2020   CCF (congestive cardiac failure) (HCC) 11/29/2020   Cerumen impaction 11/29/2020   Clinical depression 11/29/2020   Chronic constipation 11/29/2020   Diabetes mellitus type 2, uncontrolled 11/29/2020   Discharge  from the vagina 11/29/2020   Dryness of vagina 11/29/2020   Edema leg 11/29/2020   Encounter for general adult medical examination without abnormal findings 11/29/2020   Folliculitis 11/29/2020   Fungal infection of toenail 11/29/2020   HLD (hyperlipidemia) 11/29/2020   Infection of the upper respiratory tract 11/29/2020   Abdominal wall abscess 11/29/2020   Ache in joint 11/29/2020   Need for  vaccination 11/29/2020   Nocturnal dyspnea 11/29/2020   Panaritium of toe 11/29/2020   Polypharmacy 11/29/2020   Received influenza vaccination at hospital 11/29/2020   Trichomonal fluor vaginalis 11/29/2020   Pain in right hand 11/13/2020   Elevated blood-pressure reading, without diagnosis of hypertension 01/31/2020   Failed back syndrome of lumbar spine 01/31/2020   Spinal cord stimulator status 08/16/2019   Opioid dependence (HCC) 02/23/2019   Essential hypertension 12/18/2018   Syncope 08/01/2018   AKI (acute kidney injury) 08/01/2018   Menorrhagia with irregular cycle 06/02/2018   Palpitations 08/18/2017   Hypokalemia 08/18/2017   Elevated LFTs 12/24/2015   OSA (obstructive sleep apnea) 02/21/2015   Excessive daytime sleepiness 11/29/2014   Snoring 11/29/2014   Goiter 09/06/2014   Fatigue 09/06/2014   Chronic systolic CHF (congestive heart failure) (HCC) 05/31/2014   Open breast wound 12/26/2013   Breast injury 12/05/2013   Left breast abscess 11/26/2013   Chronic systolic heart failure (HCC) 11/03/2013   History of iron deficiency anemia 10/12/2013   Lumbosacral radiculitis 08/22/2013   Carpal tunnel syndrome 06/26/2013   Back pain, chronic 06/26/2013   Idiopathic peripheral neuropathy 06/26/2013   History of gastric bypass 03/13/2012   Anxiety disorder 03/13/2012   Nervous breakdown 03/13/2012   Hematochezia 03/13/2012   Iron deficiency anemia due to chronic blood loss 03/13/2012   DM type 2 (diabetes mellitus, type 2) (HCC) 03/13/2012   Generalized weakness 03/13/2012   Generalized abdominal pain 03/13/2012   Hyperactive bowel sounds 03/13/2012   Dehydration 03/13/2012   Insomnia 03/13/2012   Diabetic neuropathy (HCC) 03/13/2012   Migraine headache 03/13/2012   Major depressive disorder, recurrent episode, moderate (HCC) 02/18/2012    Class: Acute   Compulsive behavior disorder (HCC) 02/18/2012    Class: Chronic   Nausea and vomiting 07/21/2011   Personal  history of surgery to heart and great vessels, presenting hazards to health 06/30/2011   Morbid obesity (HCC) 06/25/2011   IDDM (insulin  dependent diabetes mellitus) 06/25/2011   Degenerative disc disease 06/25/2011   PCP:  Stephanie Charlene CROME, MD Pharmacy:   [id number] - [place], [place] - [address] AT [facility] [address] [place] [address] Phone: [phone] Fax: [phone]     Social Drivers of Health (SDOH) Social History: SDOH Screenings   Food Insecurity: Food Insecurity Present (06/15/2024)  Housing: High Risk (06/15/2024)  Transportation Needs: Unmet Transportation Needs (06/15/2024)  Utilities: At Risk (06/15/2024)  Social Connections: Moderately Isolated (06/15/2024)  Tobacco Use: Low Risk (06/15/2024)   SDOH Interventions:     Readmission Risk Interventions     No data to display

## 2024-06-16 NOTE — Progress Notes (Incomplete)
 Patient ID: [name], female   DOB: [birth year], 54 y.o.   MRN: [id number]    Progress Note   Subjective  Day # 3 CC; left chest and arm pain, elevated LFTs  Labs today-WBC 6.4/hemoglobin 13.3/hematocrit 40.8 Calcium  4.2/BUN 31/creatinine 1.5 T. bili 0.4/alk phos 423/AST 186/ALT 324-trending down ANA pend   Objective   Vital signs in last 24 hours: Temp:  [97.7 F (36.5 C)-98.1 F (36.7 C)] 97.7 F (36.5 C) (01/02 0907) Pulse Rate:  [75-88] 82 (01/02 0907) Resp:  [12-22] 19 (01/02 0440) BP: (83-127)/(65-90) 127/90 (01/02 0907) SpO2:  [97 %-100 %] 97 % (01/02 0907) Weight:  [75 kg-75.8 kg] 75.8 kg (01/02 0451) Last BM Date : 06/12/24 General:    AA female in NAD Heart:  Regular rate and rhythm; no murmurs Lungs: Respirations even and unlabored, lungs CTA bilaterally Abdomen:  Soft, nontender and nondistended. Normal bowel sounds. Extremities:  Without edema. Neurologic:  Alert and oriented,  grossly normal neurologically. Psych:  Cooperative. Normal mood and affect.  Intake/Output from previous day: 01/01 0701 - 01/02 0700 In: 270 [P.O.:270] Out: 0  Intake/Output this shift: No intake/output data recorded.  Lab Results: Recent Labs    06/13/24 1818 06/14/24 0354 06/16/24 0350  WBC 7.2 9.6 6.4  HGB 12.5 12.8 13.3  HCT 39.1 38.4 40.8  PLT 203 186 186   BMET Recent Labs    06/14/24 0354 06/15/24 0216 06/16/24 0350  NA 139 139 138  K 4.1 4.2 4.2  CL 103 104 103  CO2 25 27 25   GLUCOSE 118* 172* 127*  BUN 21* 25* 31*  CREATININE 1.09* 1.21* 1.50*  CALCIUM  8.8* 9.5 9.2   LFT Recent Labs    06/16/24 0350  PROT 6.5  ALBUMIN 3.6  AST 186*  ALT 324*  ALKPHOS 423*  BILITOT 0.4   PT/INR Recent Labs    06/15/24 0216  LABPROT 14.5  INR 1.1    Studies/Results: CT CHEST ABDOMEN PELVIS W CONTRAST Result Date: 06/15/2024 EXAM: CT CHEST, ABDOMEN AND PELVIS WITH CONTRAST 06/15/2024 09:45:31 AM TECHNIQUE: CT of the chest, abdomen and pelvis was  performed with the administration of 75 mL of intravenous iohexol  (OMNIPAQUE ) 350 MG/ML injection. Multiplanar reformatted images are provided for review. Automated exposure control, iterative reconstruction, and/or weight based adjustment of the mA/kV was utilized to reduce the radiation dose to as low as reasonably achievable. COMPARISON: Abdominal sonogram dated 06/14/2024. CLINICAL HISTORY: Elevated LFT's, left chest pain. FINDINGS: CHEST: MEDIASTINUM AND LYMPH NODES: Heart and pericardium are unremarkable. The central airways are clear. No mediastinal, hilar or axillary lymphadenopathy. LUNGS AND PLEURA: Ground glass and streaky opacities are present within the lower lobes bilaterally. There is also mild bronchiectasis within the lower lobes. The nondependent lungs are clear. No pleural effusion. No pneumothorax. ABDOMEN AND PELVIS: LIVER: Unremarkable. GALLBLADDER AND BILE DUCTS: Status post cholecystectomy. No biliary ductal dilatation. SPLEEN: No acute abnormality. PANCREAS: No acute abnormality. ADRENAL GLANDS: No acute abnormality. KIDNEYS, URETERS AND BLADDER: No stones in the kidneys or ureters. No hydronephrosis. No perinephric or periureteral stranding. Urinary bladder is unremarkable. GI AND BOWEL: Status post gastric bypass surgery. Stomach demonstrates no acute abnormality. There is no bowel obstruction. REPRODUCTIVE ORGANS: No acute abnormality. PERITONEUM AND RETROPERITONEUM: No ascites. No free air. VASCULATURE: Aorta is normal in caliber. ABDOMINAL AND PELVIS LYMPH NODES: No lymphadenopathy. REPRODUCTIVE ORGANS: No acute abnormality. BONES AND SOFT TISSUES: No acute osseous abnormality. There is a small supraumbilical fat-containing incisional hernia just the left of midline.  A spinal cord stimulator device is also present. IMPRESSION: 1. Ground glass and streaky opacities in the lower lobes bilaterally with mild bronchiectasis. 2. Small supraumbilical fat-containing incisional hernia just left  of midline. Electronically signed by: Evalene Coho MD 06/15/2024 11:06 AM EST RP Workstation: [id number]   US  Abdomen Complete Result Date: 06/14/2024 EXAM: COMPLETE ABDOMINAL ULTRASOUND TECHNIQUE: Real-time ultrasonography of the abdomen was performed. COMPARISON: US  Abdomen Limited 08/01/2018. CLINICAL HISTORY: Elevated liver function tests and chest pain. FINDINGS: LIVER: Hepatic parenchyma increased in echogenicity. No intrahepatic biliary ductal dilatation. No mass. BILIARY SYSTEM: Status post cholecystectomy. Negative sonographic Murphy sign. Common bile duct measures 2 mm. KIDNEYS: The right kidney measures 9 cm in length. The left kidney measures 10 cm in length. Normal contour of kidneys. Normal cortical echogenicity. Question trace proximal left hydroureter. No calculus. No mass. PANCREAS: Limited evaluation of the pancreas due to overlying bowel. SPLEEN: The spleen is normal in size with no focal splenic lesion. Spleen length measures 8.7 cm. VESSELS: Visualized portion of the aorta is normal. Limited evaluation of the inferior vena cava due to overlying bowel. Hepatopetal flow in the portal vein. OTHER: No ascites. IMPRESSION: 1. Question trace proximal left hydroureter. 2. Status post cholecystectomy. Electronically signed by: Morgane Naveau MD 06/14/2024 07:43 PM EST RP Workstation: [id number]       Assessment / Plan:         Principal Problem:   Chest pain Active Problems:   Elevated LFTs     LOS: 0 days   [name]  06/16/2024, 11:03 AM

## 2024-06-16 NOTE — Plan of Care (Signed)

## 2024-06-16 NOTE — Plan of Care (Signed)

## 2024-06-16 NOTE — Progress Notes (Signed)
 " PROGRESS NOTE  [name] FMW:[id number] DOB: 07/26/1970 DOA: 06/13/2024 PCP: Stephanie Charlene CROME, MD   LOS: 0 days   Brief Narrative / Interim history: 54 year old female with history of nonischemic cardiomyopathy, chronic systolic CHF (EF improved to 55-60% just last month from prior values of 20-25%), anemia, chronic pain, DM 2, HTN, HLD, obesity status post gastric bypass, who comes into the hospital with chest pain.  Incidentally she was found to have fairly significant LFTs and GI was consulted  Subjective / 24h Interval events: Sleepy, but wakes up appropriately, complains of generalized weakness.  No chest pains, no abdominal pain, no nausea or vomiting  Assesement and Plan: Principal problem Elevated LFTs - GI consulted, discussed with Dr. Legrand and etiology is not entirely clear.  LFTs trending downwards today, bilirubin is normal.  Right upper quadrant ultrasound was fairly unremarkable. - Acute hepatitis panel is negative.  CT scan of the chest abdomen and pelvis was unremarkable - She is on Percocet at home, Tylenol  level was checked and it was found to be undetectably low.  Inflammatory markers unremarkable with normal sed rate - She denies being on nonprescription supplements/herbs.  Continue to closely monitor LFTs.  Rule out autoimmune with ANA, pending  Active problem Chest pain -has been having chest pain like this in the past.  She ruled out for ACS with negative cardiac markers.  She has had a 2D echo about a week ago showing normal EF 55 to 60%, no wall motion abnormalities, grade 1 diastolic dysfunction.  RV systolic function and ventricular size were normal - Informally discussed with Dr. Rolan as he is her cardiologist, this is less likely ACS  AKI-creatinine up to 1.5 today, this is probably due to Lasix  yesterday.  Baseline creatinine 1.0-1.1.  Monitor  Chronic systolic CHF-EF is now recovered.  She was very orthostatic yesterday in the ER and still is today,  Entresto  and diuretics have been on hold.  Briefly discussed with cardiology, that is okay because her EF recovered  Chronic pain -has been getting regular prescriptions from 1 provider, database reviewed.  She has chronic back pain and had 2 surgeries in the past.  Resume oxycodone , avoid Percocet that has acetaminophen  in its formulation  Essential hypertension-continue medications as below, blood pressure is stable  History of gastric bypass -noted  DM2, with hyperglycemia-last A1c in our system is in 2020.  She appears to be only on Jardiance at home.  She was hyperglycemic on earlier BMPs, start sliding scale  Scheduled Meds:  DULoxetine   90 mg Oral Daily   empagliflozin  25 mg Oral q AM   famotidine   20 mg Oral BID   fluticasone   2 spray Each Nare Daily   insulin  aspart  0-9 Units Subcutaneous TID WC   metoprolol  succinate  25 mg Oral q AM   montelukast   10 mg Oral Daily   pantoprazole   40 mg Oral QHS   pregabalin   150 mg Oral TID   Continuous Infusions: PRN Meds:.acetaminophen  **OR** acetaminophen , alum & mag hydroxide-simeth, melatonin, naLOXone (NARCAN)  injection, ondansetron  (ZOFRAN ) IV, oxyCODONE , promethazine   Current Outpatient Medications  Medication Instructions   albuterol  (VENTOLIN  HFA) 108 (90 Base) MCG/ACT inhaler 2 puffs, Inhalation, Every 4 hours PRN   Biotin  (BIOTIN  EXTRA STRENGTH) 10 mg, Oral, Every morning   cloNIDine (CATAPRES) 0.1 mg, Oral, Daily at bedtime   DULoxetine  (CYMBALTA ) 60 mg, Oral, Daily   DULoxetine  (CYMBALTA ) 30 mg, Oral, Daily   empagliflozin (JARDIANCE) 25 mg, Oral,  Every morning   famotidine  (PEPCID ) 20 mg, Oral, 2 times daily   fluticasone  (FLONASE ) 50 MCG/ACT nasal spray 2 sprays, Each Nare, Daily   metoprolol  succinate (TOPROL -XL) 25 mg, Oral, Every morning   montelukast  (SINGULAIR ) 10 mg, Oral, Daily   Mounjaro 15 mg, Subcutaneous, Every Sat   ondansetron  (ZOFRAN ) 4 MG tablet TAKE 1 TABLET BY MOUTH EVERY 8 HOURS AS NEEDED FOR NAUSEA  AND VOMITING   oxyCODONE -acetaminophen  (PERCOCET) 10-325 MG per tablet 0.5-1 tablets, Oral, Every 4 hours PRN   potassium chloride  (KLOR-CON ) 10 MEQ tablet TAKE 6 TABLETS BY MOUTH 2 (TWO) TIMES DAILY FOR 3 DAYS, THEN 6 TABLETS (60 MEQ TOTAL) DAILY.   pregabalin  (LYRICA ) 150 mg, Oral, 3 times daily   promethazine  (PHENERGAN ) 25 MG tablet promethazine  25 mg tablet  TAKE 1/2 TO 1 TABLET BY MOUTH EVERY 6 HOURS AS NEEDED FOR NAUSEA   rosuvastatin  (CRESTOR ) 10 mg, Oral, Daily   sacubitril -valsartan  (ENTRESTO ) 24-26 MG 1 tablet, Oral, 2 times daily   solifenacin (VESICARE) 5 MG tablet solifenacin 5 mg tablet  TAKE 1 (ONE) TABLET DAILY FOR BLADDER CONTROL   spironolactone  (ALDACTONE ) 25 mg, Oral, Daily   torsemide  (DEMADEX ) 20 MG tablet TAKE 2 TABLETS BY MOUTH 2 (TWO) TIMES DAILY FOR 3 DAYS, THEN 1 TABLET (20 MG TOTAL) 2 TIMES DAILY.   Vitamin D3 5,000 Units, Oral, Every morning    Diet Orders (From admission, onward)     Start     Ordered   06/14/24 1326  Diet regular Room service appropriate? Yes; Fluid consistency: Thin  Diet effective now       Question Answer Comment  Room service appropriate? Yes   Fluid consistency: Thin      06/14/24 1325            DVT prophylaxis: SCDs Start: 06/14/24 0001   Lab Results  Component Value Date   PLT 186 06/16/2024      Code Status: Full Code  Family Communication: No family at bedside  Status is: Observation The patient will require care spanning > 2 midnights and should be moved to inpatient because: Elevated LFTs, AKI  Level of care: Telemetry  Consultants:  Gastroenterology  Objective: Vitals:   06/15/24 2010 06/16/24 0440 06/16/24 0451 06/16/24 0907  BP: 93/73 (!) 83/65 119/80 (!) 127/90  Pulse: 75 84 81 82  Resp:  19    Temp: 97.8 F (36.6 C) 98.1 F (36.7 C) 98.1 F (36.7 C) 97.7 F (36.5 C)  TempSrc: Oral  Oral Oral  SpO2:  100% 99% 97%  Weight:   75.8 kg   Height:        Intake/Output Summary (Last 24 hours)  at 06/16/2024 1318 Last data filed at 06/16/2024 0451 Gross per 24 hour  Intake 270 ml  Output 0 ml  Net 270 ml   Wt Readings from Last 3 Encounters:  06/16/24 75.8 kg  06/05/24 71.8 kg  03/21/24 72.2 kg    Examination:  Constitutional: NAD Eyes: lids and conjunctivae normal, no scleral icterus ENMT: mmm Neck: normal, supple Respiratory: clear to auscultation bilaterally, no wheezing, no crackles. Normal respiratory effort.  Cardiovascular: Regular rate and rhythm, no murmurs / rubs / gallops. No LE edema. Abdomen: soft, no distention, no tenderness. Bowel sounds positive.   Data Reviewed: I have independently reviewed following labs and imaging studies   CBC Recent Labs  Lab 06/13/24 1818 06/14/24 0354 06/16/24 0350  WBC 7.2 9.6 6.4  HGB 12.5 12.8 13.3  HCT  39.1 38.4 40.8  PLT 203 186 186  MCV 93.5 91.0 93.8  MCH 29.9 30.3 30.6  MCHC 32.0 33.3 32.6  RDW 12.2 12.3 12.6  LYMPHSABS 2.7 2.5  --   MONOABS 0.6 0.8  --   EOSABS 0.3 0.3  --   BASOSABS 0.0 0.0  --     Recent Labs  Lab 06/13/24 1818 06/13/24 2046 06/14/24 0354 06/15/24 0216 06/16/24 0350  NA 139  --  139 139 138  K 3.8  --  4.1 4.2 4.2  CL 103  --  103 104 103  CO2 26  --  25 27 25   GLUCOSE 155*  --  118* 172* 127*  BUN 22*  --  21* 25* 31*  CREATININE 1.10*  --  1.09* 1.21* 1.50*  CALCIUM  8.9  --  8.8* 9.5 9.2  AST 85*  --  859* 747* 186*  ALT 71*  --  252* 523* 324*  ALKPHOS 178*  --  326* 513* 423*  BILITOT 0.4  --  0.8 0.6 0.4  ALBUMIN 3.8  --  3.7 3.9 3.6  MG  --  2.6*  --   --  2.6*  CRP  --   --   --  0.8  --   INR  --   --   --  1.1  --   HGBA1C  --   --   --  7.6*  --     ------------------------------------------------------------------------------------------------------------------ No results for input(s): CHOL, HDL, LDLCALC, TRIG, CHOLHDL, LDLDIRECT in the last 72 hours.  Lab Results  Component Value Date   HGBA1C 7.6 (H) 06/15/2024    ------------------------------------------------------------------------------------------------------------------ No results for input(s): TSH, T4TOTAL, T3FREE, THYROIDAB in the last 72 hours.  Invalid input(s): FREET3  Cardiac Enzymes No results for input(s): CKMB, TROPONINI, MYOGLOBIN in the last 168 hours.  Invalid input(s): CK ------------------------------------------------------------------------------------------------------------------    Component Value Date/Time   BNP 100.4 (H) 06/17/2023 1027    CBG: Recent Labs  Lab 06/15/24 1156 06/15/24 1642 06/15/24 2321 06/16/24 0849 06/16/24 1133  GLUCAP 135* 167* 126* 126* 143*    No results found for this or any previous visit (from the past 240 hours).   Radiology Studies: No results found.    Nilda Fendt, MD, PhD [facility]  Between 7 am - 7 pm I am available, please contact me via Amion (for emergencies) or Securechat (non urgent messages)  Between 7 pm - 7 am I am not available, please contact night coverage MD/APP via Amion  "

## 2024-06-16 NOTE — Discharge Instructions (Addendum)
 Follow with Hamrick, Maura L, MD in 5-7 days  Follow-up with cardiology within 1 to 2 weeks.  Please check your blood pressure daily, if you see low readings such as systolic blood pressure (high number) less than 100, you can hold the diuretics for the day  Please get a complete blood count and chemistry panel checked by your Primary MD at your next visit, and again as instructed by your Primary MD. Please get your medications reviewed and adjusted by your Primary MD.  Please request your Primary MD to go over all Hospital Tests and Procedure/Radiological results at the follow up, please get all Hospital records sent to your Prim MD by signing hospital release before you go home.  In some cases, there will be blood work, cultures and biopsy results pending at the time of your discharge. Please request that your primary care M.D. goes through all the records of your hospital data and follows up on these results.  If you had Pneumonia of Lung problems at the Hospital: Please get a 2 view Chest X ray done in 6-8 weeks after hospital discharge or sooner if instructed by your Primary MD.  If you have Congestive Heart Failure: Please call your Cardiologist or Primary MD anytime you have any of the following symptoms:  1) 3 pound weight gain in 24 hours or 5 pounds in 1 week  2) shortness of breath, with or without a dry hacking cough  3) swelling in the hands, feet or stomach  4) if you have to sleep on extra pillows at night in order to breathe  Follow cardiac low salt diet and 1.5 lit/day fluid restriction.  If you have diabetes Accuchecks 4 times/day, Once in AM empty stomach and then before each meal. Log in all results and show them to your primary doctor at your next visit. If any glucose reading is under 80 or above 300 call your primary MD immediately.  If you have Seizure/Convulsions/Epilepsy: Please do not drive, operate heavy machinery, participate in activities at heights or  participate in high speed sports until you have seen by Primary MD or a Neurologist and advised to do so again. Per [place]  DMV statutes, patients with seizures are not allowed to drive until they have been seizure-free for six months.  Use caution when using heavy equipment or power tools. Avoid working on ladders or at heights. Take showers instead of baths. Ensure the water  temperature is not too high on the home water  heater. Do not go swimming alone. Do not lock yourself in a room alone (i.e. bathroom). When caring for infants or small children, sit down when holding, feeding, or changing them to minimize risk of injury to the child in the event you have a seizure. Maintain good sleep hygiene. Avoid alcohol.   If you had Gastrointestinal Bleeding: Please ask your Primary MD to check a complete blood count within one week of discharge or at your next visit. Your endoscopic/colonoscopic biopsies that are pending at the time of discharge, will also need to followed by your Primary MD.  Get Medicines reviewed and adjusted. Please take all your medications with you for your next visit with your Primary MD  Please request your Primary MD to go over all hospital tests and procedure/radiological results at the follow up, please ask your Primary MD to get all Hospital records sent to his/her office.  If you experience worsening of your admission symptoms, develop shortness of breath, life threatening emergency, suicidal or homicidal  thoughts you must seek medical attention immediately by calling 911 or calling your MD immediately  if symptoms less severe.  You must read complete instructions/literature along with all the possible adverse reactions/side effects for all the Medicines you take and that have been prescribed to you. Take any new Medicines after you have completely understood and accpet all the possible adverse reactions/side effects.   Do not drive or operate heavy machinery when  taking Pain medications.   Do not take more than prescribed Pain, Sleep and Anxiety Medications  Special Instructions: If you have smoked or chewed Tobacco  in the last 2 yrs please stop smoking, stop any regular Alcohol  and or any Recreational drug use.  Wear Seat belts while driving.  Please note You were cared for by a hospitalist during your hospital stay. If you have any questions about your discharge medications or the care you received while you were in the hospital after you are discharged, you can call the unit and asked to speak with the hospitalist on call if the hospitalist that took care of you is not available. Once you are discharged, your primary care physician will handle any further medical issues. Please note that NO REFILLS for any discharge medications will be authorized once you are discharged, as it is imperative that you return to your primary care physician (or establish a relationship with a primary care physician if you do not have one) for your aftercare needs so that they can reassess your need for medications and monitor your lab values.  You can reach the hospitalist office at phone [phone] or fax [phone]   If you do not have a primary care physician, you can call [phone] for a physician referral.  Activity: As tolerated with Full fall precautions use walker/cane & assistance as needed    Diet: low sodium  Disposition Home

## 2024-06-17 DIAGNOSIS — K5909 Other constipation: Secondary | ICD-10-CM | POA: Diagnosis not present

## 2024-06-17 DIAGNOSIS — R079 Chest pain, unspecified: Secondary | ICD-10-CM | POA: Diagnosis not present

## 2024-06-17 DIAGNOSIS — R7989 Other specified abnormal findings of blood chemistry: Secondary | ICD-10-CM | POA: Diagnosis not present

## 2024-06-17 LAB — COMPREHENSIVE METABOLIC PANEL WITH GFR
ALT: 230 U/L — ABNORMAL HIGH (ref 0–44)
AST: 102 U/L — ABNORMAL HIGH (ref 15–41)
Albumin: 3.8 g/dL (ref 3.5–5.0)
Alkaline Phosphatase: 397 U/L — ABNORMAL HIGH (ref 38–126)
Anion gap: 8 (ref 5–15)
BUN: 28 mg/dL — ABNORMAL HIGH (ref 6–20)
CO2: 27 mmol/L (ref 22–32)
Calcium: 9.2 mg/dL (ref 8.9–10.3)
Chloride: 102 mmol/L (ref 98–111)
Creatinine, Ser: 1.08 mg/dL — ABNORMAL HIGH (ref 0.44–1.00)
GFR, Estimated: >60 mL/min
Glucose, Bld: 142 mg/dL — ABNORMAL HIGH (ref 70–99)
Potassium: 4.7 mmol/L (ref 3.5–5.1)
Sodium: 138 mmol/L (ref 135–145)
Total Bilirubin: 0.5 mg/dL (ref 0.0–1.2)
Total Protein: 6.9 g/dL (ref 6.5–8.1)

## 2024-06-17 LAB — GLUCOSE, CAPILLARY
Glucose-Capillary: 127 mg/dL — ABNORMAL HIGH (ref 70–99)
Glucose-Capillary: 153 mg/dL — ABNORMAL HIGH (ref 70–99)
Glucose-Capillary: 144 mg/dL — ABNORMAL HIGH (ref 70–99)
Glucose-Capillary: 115 mg/dL — ABNORMAL HIGH (ref 70–99)
Glucose-Capillary: 195 mg/dL — ABNORMAL HIGH (ref 70–99)

## 2024-06-17 LAB — ANA W/REFLEX IF POSITIVE: Anti Nuclear Antibody (ANA): NEGATIVE

## 2024-06-17 MED ORDER — SODIUM CHLORIDE 0.9 % IV SOLN: 25.0000 mg | Freq: Four times a day (QID) | INTRAVENOUS | Status: DC | PRN

## 2024-06-17 MED ORDER — LACTATED RINGERS IV BOLUS
500.0000 mL | Freq: Once | INTRAVENOUS | Status: AC
  Administered 2024-06-17: 500 mL via INTRAVENOUS

## 2024-06-17 MED ORDER — METOPROLOL SUCCINATE ER 25 MG PO TB24
12.5000 mg | ORAL_TABLET | Freq: Every morning | ORAL | Status: DC
  Administered 2024-06-17 – 2024-06-18 (×2): 12.5 mg via ORAL
  Filled 2024-06-17 (×2): qty 1

## 2024-06-17 MED ORDER — NALOXEGOL OXALATE 12.5 MG PO TABS
12.5000 mg | ORAL_TABLET | Freq: Every day | ORAL | Status: DC
  Administered 2024-06-17 – 2024-06-18 (×2): 12.5 mg via ORAL
  Filled 2024-06-17 (×3): qty 1

## 2024-06-17 NOTE — Progress Notes (Signed)
" °   06/17/24 1804  Spiritual Encounters  Type of Visit Initial  Care provided to: Patient  Referral source Patient request  Reason for visit Advance directives  OnCall Visit Yes   Chaplain introduced and educated the patient on advanced directive paperwork. Patient indicated that she would likely be discharged tomorrow, but would complete this at home. Patient asked for prayer for her health and for her son. Chaplain offered prayer and support. Chaplain introduced spiritual care services. Spiritual care services available as needed.   Juliene CHRISTELLA Das, Chaplain 06/17/2024  "

## 2024-06-17 NOTE — Plan of Care (Signed)

## 2024-06-17 NOTE — Progress Notes (Signed)
 " PROGRESS NOTE  [name] FMW:[id number] DOB: 1971-04-02 DOA: 06/13/2024 PCP: Stephanie Charlene CROME, MD   LOS: 0 days   Brief Narrative / Interim history: 54 year old female with history of nonischemic cardiomyopathy, chronic systolic CHF (EF improved to 55-60% just last month from prior values of 20-25%), anemia, chronic pain, DM 2, HTN, HLD, obesity status post gastric bypass, who comes into the hospital with chest pain.  Incidentally she was found to have fairly significant LFTs and GI was consulted  Subjective / 24h Interval events: Still feels intermittently dizzy when she stands up.  Had a low blood pressure read of 90s earlier on  Assesement and Plan: Principal problem Elevated LFTs - GI consulted, discussed with Dr. Legrand and etiology is not entirely clear.  Bilirubin was normal.  Right upper quadrant sono was unremarkable.  Acute hepatitis panel was negative.  CT scan of the abdomen and pelvis were unremarkable.  She is on Percocet at home, Tylenol  level was undetectable, inflammatory markers were negative.  She was found to be hypotensive and orthostatic which is likely the main culprit at this point given improvement in her LFTs with improvement in her blood pressure.  Active problem Chest pain -has been having chest pain like this in the past.  She ruled out for ACS with negative cardiac markers.  She has had a 2D echo about a week ago showing normal EF 55 to 60%, no wall motion abnormalities, grade 1 diastolic dysfunction.  RV systolic function and ventricular size were normal.  Informally discussed with Dr. Rolan as he is her cardiologist, this is less likely ACS  AKI-possibly in the setting of hypotension/hypoperfusion.  Creatinine normalized today  Chronic systolic CHF-EF is now recovered.  She was very orthostatic in the ED and still is, Entresto  now discontinued.  This was discussed with her cardiologist  Chronic pain -Home medications  Essential  hypertension/hypotension-continue medications as below, blood pressure is stable.  Will give 500 cc of fluids today given persistent symptoms  History of gastric bypass -noted  DM2, with hyperglycemia-last A1c in our system is in 2020.  She appears to be only on Jardiance  at home.  She was hyperglycemic on earlier BMPs, start sliding scale  Scheduled Meds:  DULoxetine   90 mg Oral Daily   empagliflozin   25 mg Oral q AM   famotidine   20 mg Oral BID   fluticasone   2 spray Each Nare Daily   insulin  aspart  0-9 Units Subcutaneous TID WC   metoprolol  succinate  12.5 mg Oral q AM   montelukast   10 mg Oral Daily   naloxegol  oxalate  12.5 mg Oral Daily   polyethylene glycol  17 g Oral BID   pregabalin   150 mg Oral TID   Continuous Infusions:  diphenhydrAMINE      PRN Meds:.acetaminophen  **OR** acetaminophen , alum & mag hydroxide-simeth, bisacodyl , diphenhydrAMINE , melatonin, naLOXone  (NARCAN )  injection, ondansetron  (ZOFRAN ) IV, oxyCODONE , promethazine   Current Outpatient Medications  Medication Instructions   albuterol  (VENTOLIN  HFA) 108 (90 Base) MCG/ACT inhaler 2 puffs, Inhalation, Every 4 hours PRN   Biotin  (BIOTIN  EXTRA STRENGTH) 10 mg, Oral, Every morning   cloNIDine  (CATAPRES ) 0.1 mg, Oral, Daily at bedtime   DULoxetine  (CYMBALTA ) 60 mg, Oral, Daily   DULoxetine  (CYMBALTA ) 30 mg, Oral, Daily   empagliflozin  (JARDIANCE ) 25 mg, Oral, Every morning   famotidine  (PEPCID ) 20 mg, Oral, 2 times daily   fluticasone  (FLONASE ) 50 MCG/ACT nasal spray 2 sprays, Each Nare, Daily   metoprolol  succinate (TOPROL -XL) 25 mg,  Oral, Every morning   montelukast  (SINGULAIR ) 10 mg, Oral, Daily   Mounjaro 15 mg, Subcutaneous, Every Sat   ondansetron  (ZOFRAN ) 4 MG tablet TAKE 1 TABLET BY MOUTH EVERY 8 HOURS AS NEEDED FOR NAUSEA AND VOMITING   oxyCODONE -acetaminophen  (PERCOCET) 10-325 MG per tablet 0.5-1 tablets, Oral, Every 4 hours PRN   potassium chloride  (KLOR-CON ) 10 MEQ tablet TAKE 6 TABLETS BY MOUTH 2  (TWO) TIMES DAILY FOR 3 DAYS, THEN 6 TABLETS (60 MEQ TOTAL) DAILY.   pregabalin  (LYRICA ) 150 mg, Oral, 3 times daily   promethazine  (PHENERGAN ) 25 MG tablet promethazine  25 mg tablet  TAKE 1/2 TO 1 TABLET BY MOUTH EVERY 6 HOURS AS NEEDED FOR NAUSEA   rosuvastatin  (CRESTOR ) 10 mg, Oral, Daily   sacubitril -valsartan  (ENTRESTO ) 24-26 MG 1 tablet, Oral, 2 times daily   solifenacin (VESICARE) 5 MG tablet solifenacin 5 mg tablet  TAKE 1 (ONE) TABLET DAILY FOR BLADDER CONTROL   spironolactone  (ALDACTONE ) 25 mg, Oral, Daily   torsemide  (DEMADEX ) 20 MG tablet TAKE 2 TABLETS BY MOUTH 2 (TWO) TIMES DAILY FOR 3 DAYS, THEN 1 TABLET (20 MG TOTAL) 2 TIMES DAILY.   Vitamin D3 5,000 Units, Oral, Every morning    Diet Orders (From admission, onward)     Start     Ordered   06/14/24 1326  Diet regular Room service appropriate? Yes; Fluid consistency: Thin  Diet effective now       Question Answer Comment  Room service appropriate? Yes   Fluid consistency: Thin      06/14/24 1325            DVT prophylaxis: SCDs Start: 06/14/24 0001   Lab Results  Component Value Date   PLT 186 06/16/2024      Code Status: Full Code  Family Communication: No family at bedside  Status is: Observation The patient will require care spanning > 2 midnights and should be moved to inpatient because: Still orthostatic, IV fluids  Level of care: Telemetry  Consultants:  Gastroenterology  Objective: Vitals:   06/16/24 1641 06/16/24 2030 06/17/24 0545 06/17/24 0825  BP: 104/72 110/78 93/69 122/85  Pulse: 91 89 89 92  Resp:  18 18   Temp: 97.8 F (36.6 C) 98.8 F (37.1 C) 97.7 F (36.5 C) 97.8 F (36.6 C)  TempSrc: Oral Oral  Oral  SpO2: 99% 100% 100% 100%  Weight:      Height:        Intake/Output Summary (Last 24 hours) at 06/17/2024 1257 Last data filed at 06/17/2024 9666 Gross per 24 hour  Intake 480 ml  Output --  Net 480 ml   Wt Readings from Last 3 Encounters:  06/16/24 75.8 kg  06/05/24  71.8 kg  03/21/24 72.2 kg    Examination:  Constitutional: NAD Eyes: lids and conjunctivae normal, no scleral icterus ENMT: mmm Neck: normal, supple Respiratory: clear to auscultation bilaterally, no wheezing, no crackles. Normal respiratory effort.  Cardiovascular: Regular rate and rhythm, no murmurs / rubs / gallops. No LE edema. Abdomen: soft, no distention, no tenderness. Bowel sounds positive.   Data Reviewed: I have independently reviewed following labs and imaging studies   CBC Recent Labs  Lab 06/13/24 1818 06/14/24 0354 06/16/24 0350  WBC 7.2 9.6 6.4  HGB 12.5 12.8 13.3  HCT 39.1 38.4 40.8  PLT 203 186 186  MCV 93.5 91.0 93.8  MCH 29.9 30.3 30.6  MCHC 32.0 33.3 32.6  RDW 12.2 12.3 12.6  LYMPHSABS 2.7 2.5  --  MONOABS 0.6 0.8  --   EOSABS 0.3 0.3  --   BASOSABS 0.0 0.0  --     Recent Labs  Lab 06/13/24 1818 06/13/24 2046 06/14/24 0354 06/15/24 0216 06/16/24 0350 06/17/24 0651  NA 139  --  139 139 138 138  K 3.8  --  4.1 4.2 4.2 4.7  CL 103  --  103 104 103 102  CO2 26  --  25 27 25 27   GLUCOSE 155*  --  118* 172* 127* 142*  BUN 22*  --  21* 25* 31* 28*  CREATININE 1.10*  --  1.09* 1.21* 1.50* 1.08*  CALCIUM  8.9  --  8.8* 9.5 9.2 9.2  AST 85*  --  859* 747* 186* 102*  ALT 71*  --  252* 523* 324* 230*  ALKPHOS 178*  --  326* 513* 423* 397*  BILITOT 0.4  --  0.8 0.6 0.4 0.5  ALBUMIN 3.8  --  3.7 3.9 3.6 3.8  MG  --  2.6*  --   --  2.6*  --   CRP  --   --   --  0.8  --   --   INR  --   --   --  1.1  --   --   HGBA1C  --   --   --  7.6*  --   --     ------------------------------------------------------------------------------------------------------------------ No results for input(s): CHOL, HDL, LDLCALC, TRIG, CHOLHDL, LDLDIRECT in the last 72 hours.  Lab Results  Component Value Date   HGBA1C 7.6 (H) 06/15/2024    ------------------------------------------------------------------------------------------------------------------ No results for input(s): TSH, T4TOTAL, T3FREE, THYROIDAB in the last 72 hours.  Invalid input(s): FREET3  Cardiac Enzymes No results for input(s): CKMB, TROPONINI, MYOGLOBIN in the last 168 hours.  Invalid input(s): CK ------------------------------------------------------------------------------------------------------------------    Component Value Date/Time   BNP 100.4 (H) 06/17/2023 1027    CBG: Recent Labs  Lab 06/16/24 1655 06/16/24 2029 06/17/24 0640 06/17/24 0754 06/17/24 1123  GLUCAP 84 412* 127* 153* 144*    No results found for this or any previous visit (from the past 240 hours).   Radiology Studies: No results found.    Nilda Fendt, MD, PhD [facility]  Between 7 am - 7 pm I am available, please contact me via Amion (for emergencies) or Securechat (non urgent messages)  Between 7 pm - 7 am I am not available, please contact night coverage MD/APP via Amion  "

## 2024-06-18 DIAGNOSIS — R079 Chest pain, unspecified: Secondary | ICD-10-CM | POA: Diagnosis not present

## 2024-06-18 LAB — GLUCOSE, CAPILLARY: Glucose-Capillary: 140 mg/dL — ABNORMAL HIGH (ref 70–99)

## 2024-06-18 MED ORDER — SPIRONOLACTONE 25 MG PO TABS: 12.5000 mg | ORAL_TABLET | Freq: Every day | ORAL | Status: DC

## 2024-06-18 MED ORDER — NALOXEGOL OXALATE 12.5 MG PO TABS: 12.5000 mg | ORAL_TABLET | Freq: Every day | ORAL | 0 refills | Status: AC

## 2024-06-18 MED ORDER — TORSEMIDE 20 MG PO TABS: 40.0000 mg | ORAL_TABLET | Freq: Every day | ORAL | Status: DC

## 2024-06-18 NOTE — TOC Transition Note (Signed)
 Transition of Care [facility]) - Discharge Note   Patient Details  Name: [name] MRN: [id number] Date of Birth: Mar 13, 1971  Transition of [facility]) CM/SW Contact:  Robynn Eileen Hoose, RN Phone Number: 06/18/2024, 10:13 AM   Clinical Narrative:   Patient is being discharged today. Spoke with patient regarding therapy recommendations for rolling walker. Patient prefers to use a cane instead. Cane ordered through Jermaine with [employer] to be delivered to patient bedside before discharging home.      Barriers to Discharge: Continued Medical Work up   Patient Goals and CMS Choice     Choice offered to / list presented to : NA      Discharge Placement                       Discharge Plan and Services Additional resources added to the After Visit Summary for   In-house Referral: Clinical Social Work, Orthoptist                                   Social Drivers of Health (SDOH) Interventions SDOH Screenings   Food Insecurity: Food Insecurity Present (06/15/2024)  Housing: High Risk (06/15/2024)  Transportation Needs: Unmet Transportation Needs (06/15/2024)  Utilities: At Risk (06/15/2024)  Social Connections: Moderately Isolated (06/15/2024)  Tobacco Use: Low Risk (06/15/2024)     Readmission Risk Interventions     No data to display

## 2024-06-18 NOTE — Plan of Care (Signed)

## 2024-06-18 NOTE — Care Management (Cosign Needed)
" °  °  Durable Medical Equipment  (From admission, onward)           Start     Ordered   06/18/24 1016  For home use only DME Cane  Once        06/18/24 1016            "

## 2024-06-18 NOTE — Discharge Summary (Addendum)
 "  Physician Discharge Summary  [name] FMW:[id number] DOB: 1970-10-13 DOA: 06/13/2024  PCP: Stephanie Charlene CROME, MD  Admit date: 06/13/2024 Discharge date: 06/18/2024  Admitted From: home Disposition:  home  Recommendations for Outpatient Follow-up:  Follow up with PCP in 1-2 weeks Please obtain BMP/CBC in one week Follow up with cardiology in 1-2 weeks  Home Health: none Equipment/Devices: none  Discharge Condition: stable CODE STATUS: Full code Diet Orders (From admission, onward)     Start     Ordered   06/14/24 1326  Diet regular Room service appropriate? Yes; Fluid consistency: Thin  Diet effective now       Question Answer Comment  Room service appropriate? Yes   Fluid consistency: Thin      06/14/24 1325            Brief Narrative / Interim history: 54 year old female with history of nonischemic cardiomyopathy, chronic systolic CHF (EF improved to 55-60% just last month from prior values of 20-25%), anemia, chronic pain, DM 2, HTN, HLD, obesity status post gastric bypass, who comes into the hospital with chest pain.  Incidentally she was found to have fairly significant LFTs and GI was consulted  Hospital Course / Discharge diagnoses: Principal problem Elevated LFTs - GI consulted, discussed with Dr. Legrand and etiology is not entirely clear, but there is suspicion about hypoperfusion in the setting of possible hypotension with syncopal episodes at home.  Bilirubin was normal.  Right upper quadrant sono was unremarkable.  Acute hepatitis panel was negative.  CT scan of the abdomen and pelvis were unremarkable.  She is on Percocet at home, Tylenol  level was undetectable, inflammatory markers were negative.  She was also found to be quite orthostatic here as well.  Her clonidine , Entresto  have been discontinued, and she was maintained on low-dose metoprolol .  Her LFTs have improved, GI signed off, she is able to tolerate a regular diet and will be discharged home in  stable condition   Active problem Chest pain -has been having chest pain like this in the past.  She ruled out for ACS with negative cardiac markers.  She has had a 2D echo about a week ago showing normal EF 55 to 60%, no wall motion abnormalities, grade 1 diastolic dysfunction.  RV systolic function and ventricular size were normal.    AKI-possibly in the setting of hypotension/hypoperfusion.  Creatinine normalized  Chronic systolic CHF-EF is now recovered.   Chronic pain -Home medications Essential hypertension/hypotension-Case informally discussed with Dr. Rolan with cardiology who sees her as an outpatient.  Due to hypotension we will make some adjustments to her home medications.  Her clonidine  which she takes for hot flashes will be discontinued.  She has not had any hot flashes in a long time.  Entresto  will be held on discharge.  Her diuretics will be cut in half with instructions for her to stop if she feels lightheaded, dizzy, or loses weight.  In terms of her potassium supplements, she has not taken this for the past 2 weeks and here her potassium has remained stable and is 4.7 on most recent check.  Would recommend holding potassium supplementation. History of gastric bypass -noted Narcotic induced constipation -she is on Movantik , ran out, a refill has been given to the patient. DM2, with hyperglycemia-resume home medications  Lab Results  Component Value Date   HGBA1C 7.6 (H) 06/15/2024   Sepsis ruled out   Discharge Instructions   Allergies as of 06/18/2024  Reactions   Imitrex  [sumatriptan ] Other (See Comments)   Acid reflux---pill only-- shots are okay    Lamictal [lamotrigine] Nausea And Vomiting   Metformin And Related Diarrhea        Medication List     STOP taking these medications    cloNIDine  0.1 MG tablet Commonly known as: CATAPRES    Entresto  24-26 MG Generic drug: sacubitril -valsartan    potassium chloride  10 MEQ tablet Commonly known as:  KLOR-CON        TAKE these medications    albuterol  108 (90 Base) MCG/ACT inhaler Commonly known as: VENTOLIN  HFA Inhale 2 puffs into the lungs every 4 (four) hours as needed for wheezing or shortness of breath.   Biotin  Extra Strength 10 MG Caps Generic drug: Biotin  Take 10 mg by mouth in the morning.   DULoxetine  60 MG capsule Commonly known as: CYMBALTA  Take 60 mg by mouth daily.   DULoxetine  30 MG capsule Commonly known as: CYMBALTA  Take 30 mg by mouth daily.   empagliflozin  25 MG Tabs tablet Commonly known as: JARDIANCE  Take 25 mg by mouth in the morning.   famotidine  20 MG tablet Commonly known as: PEPCID  Take 20 mg by mouth 2 (two) times daily.   fluticasone  50 MCG/ACT nasal spray Commonly known as: FLONASE  Place 2 sprays into both nostrils daily.   metoprolol  succinate 25 MG 24 hr tablet Commonly known as: TOPROL -XL Take 25 mg by mouth in the morning.   montelukast  10 MG tablet Commonly known as: SINGULAIR  Take 10 mg by mouth daily.   Mounjaro 15 MG/0.5ML Pen Generic drug: tirzepatide Inject 15 mg into the skin every Saturday.   naloxegol  oxalate 12.5 MG Tabs tablet Commonly known as: Movantik  Take 1 tablet (12.5 mg total) by mouth daily.   ondansetron  4 MG tablet Commonly known as: ZOFRAN  TAKE 1 TABLET BY MOUTH EVERY 8 HOURS AS NEEDED FOR NAUSEA AND VOMITING   oxyCODONE -acetaminophen  10-325 MG tablet Commonly known as: PERCOCET Take 0.5-1 tablets by mouth every 4 (four) hours as needed for pain.   pregabalin  150 MG capsule Commonly known as: LYRICA  Take 150 mg by mouth 3 (three) times daily.   promethazine  25 MG tablet Commonly known as: PHENERGAN  promethazine  25 mg tablet  TAKE 1/2 TO 1 TABLET BY MOUTH EVERY 6 HOURS AS NEEDED FOR NAUSEA   rosuvastatin  10 MG tablet Commonly known as: CRESTOR  Take 1 tablet (10 mg total) by mouth daily.   solifenacin 5 MG tablet Commonly known as: VESICARE solifenacin 5 mg tablet  TAKE 1 (ONE) TABLET  DAILY FOR BLADDER CONTROL   spironolactone  25 MG tablet Commonly known as: ALDACTONE  Take 0.5 tablets (12.5 mg total) by mouth daily. What changed: how much to take   torsemide  20 MG tablet Commonly known as: DEMADEX  Take 2 tablets (40 mg total) by mouth daily. What changed: See the new instructions.   Vitamin D3 125 MCG (5000 UT) Tabs Take 5,000 Units by mouth in the morning.       Consultations: Gastroenterology  Procedures/Studies:  CT CHEST ABDOMEN PELVIS W CONTRAST Result Date: 06/15/2024 EXAM: CT CHEST, ABDOMEN AND PELVIS WITH CONTRAST 06/15/2024 09:45:31 AM TECHNIQUE: CT of the chest, abdomen and pelvis was performed with the administration of 75 mL of intravenous iohexol  (OMNIPAQUE ) 350 MG/ML injection. Multiplanar reformatted images are provided for review. Automated exposure control, iterative reconstruction, and/or weight based adjustment of the mA/kV was utilized to reduce the radiation dose to as low as reasonably achievable. COMPARISON: Abdominal sonogram dated 06/14/2024. CLINICAL HISTORY: Elevated  LFT's, left chest pain. FINDINGS: CHEST: MEDIASTINUM AND LYMPH NODES: Heart and pericardium are unremarkable. The central airways are clear. No mediastinal, hilar or axillary lymphadenopathy. LUNGS AND PLEURA: Ground glass and streaky opacities are present within the lower lobes bilaterally. There is also mild bronchiectasis within the lower lobes. The nondependent lungs are clear. No pleural effusion. No pneumothorax. ABDOMEN AND PELVIS: LIVER: Unremarkable. GALLBLADDER AND BILE DUCTS: Status post cholecystectomy. No biliary ductal dilatation. SPLEEN: No acute abnormality. PANCREAS: No acute abnormality. ADRENAL GLANDS: No acute abnormality. KIDNEYS, URETERS AND BLADDER: No stones in the kidneys or ureters. No hydronephrosis. No perinephric or periureteral stranding. Urinary bladder is unremarkable. GI AND BOWEL: Status post gastric bypass surgery. Stomach demonstrates no acute  abnormality. There is no bowel obstruction. REPRODUCTIVE ORGANS: No acute abnormality. PERITONEUM AND RETROPERITONEUM: No ascites. No free air. VASCULATURE: Aorta is normal in caliber. ABDOMINAL AND PELVIS LYMPH NODES: No lymphadenopathy. REPRODUCTIVE ORGANS: No acute abnormality. BONES AND SOFT TISSUES: No acute osseous abnormality. There is a small supraumbilical fat-containing incisional hernia just the left of midline. A spinal cord stimulator device is also present. IMPRESSION: 1. Ground glass and streaky opacities in the lower lobes bilaterally with mild bronchiectasis. 2. Small supraumbilical fat-containing incisional hernia just left of midline. Electronically signed by: Evalene Coho MD 06/15/2024 11:06 AM EST RP Workstation: [id number]   US  Abdomen Complete Result Date: 06/14/2024 EXAM: COMPLETE ABDOMINAL ULTRASOUND TECHNIQUE: Real-time ultrasonography of the abdomen was performed. COMPARISON: US  Abdomen Limited 08/01/2018. CLINICAL HISTORY: Elevated liver function tests and chest pain. FINDINGS: LIVER: Hepatic parenchyma increased in echogenicity. No intrahepatic biliary ductal dilatation. No mass. BILIARY SYSTEM: Status post cholecystectomy. Negative sonographic Murphy sign. Common bile duct measures 2 mm. KIDNEYS: The right kidney measures 9 cm in length. The left kidney measures 10 cm in length. Normal contour of kidneys. Normal cortical echogenicity. Question trace proximal left hydroureter. No calculus. No mass. PANCREAS: Limited evaluation of the pancreas due to overlying bowel. SPLEEN: The spleen is normal in size with no focal splenic lesion. Spleen length measures 8.7 cm. VESSELS: Visualized portion of the aorta is normal. Limited evaluation of the inferior vena cava due to overlying bowel. Hepatopetal flow in the portal vein. OTHER: No ascites. IMPRESSION: 1. Question trace proximal left hydroureter. 2. Status post cholecystectomy. Electronically signed by: Morgane Naveau MD 06/14/2024  07:43 PM EST RP Workstation: [id number]   DG Chest 2 View Result Date: 06/13/2024 CLINICAL DATA:  Chest pain. EXAM: CHEST - 2 VIEW COMPARISON:  02/10/2023 FINDINGS: The cardiac silhouette, mediastinal and hilar contours are within normal limits. The lungs are clear. No infiltrates, edema or effusions. No pulmonary lesions. No pneumothorax. The bony thorax is intact. Stable thoracic spinal cord stimulator. IMPRESSION: No acute cardiopulmonary findings. Electronically Signed   By: MYRTIS Stammer M.D.   On: 06/13/2024 19:49   ECHOCARDIOGRAM COMPLETE Result Date: 06/07/2024    ECHOCARDIOGRAM REPORT   Patient Name:   [name] Date of Exam: 06/05/2024 Medical Rec #:  [id number]        Height:       65.0 in Accession #:    [id number]       Weight:       159.2 lb Date of Birth:  10-Jan-1971       BSA:          1.796 m Patient Age:    53 years         BP:           127/75 mmHg Patient  Gender: F                HR:           74 bpm. Exam Location:  Outpatient Procedure: 2D Echo, 3D Echo, Cardiac Doppler, Color Doppler and Strain Analysis            (Both Spectral and Color Flow Doppler were utilized during            procedure). Indications:    Congestive Heart Failure I50.9  History:        Patient has prior history of Echocardiogram examinations, most                 recent 05/21/2022. CHF; Risk Factors:Hypertension, Sleep Apnea                 and Diabetes.  Sonographer:    Koleen Popper RDCS Referring Phys: [phone] JESSICA M MILFORD  Sonographer Comments: Global longitudinal strain was attempted. IMPRESSIONS  1. Left ventricular ejection fraction, by estimation, is 55 to 60%. Left ventricular ejection fraction by 2D MOD biplane is 55.7 %. The left ventricle has normal function. The left ventricle has no regional wall motion abnormalities. Left ventricular diastolic parameters are consistent with Grade I diastolic dysfunction (impaired relaxation).  2. Right ventricular systolic function is normal. The right  ventricular size is normal. There is normal pulmonary artery systolic pressure. The estimated right ventricular systolic pressure is 26.6 mmHg.  3. The mitral valve is grossly normal. Trivial mitral valve regurgitation. No evidence of mitral stenosis.  4. The aortic valve is tricuspid. Aortic valve regurgitation is not visualized. No aortic stenosis is present.  5. The inferior vena cava is normal in size with greater than 50% respiratory variability, suggesting right atrial pressure of 3 mmHg. Comparison(s): No significant change from prior study. FINDINGS  Left Ventricle: Left ventricular ejection fraction, by estimation, is 55 to 60%. Left ventricular ejection fraction by 2D MOD biplane is 55.7 %. The left ventricle has normal function. The left ventricle has no regional wall motion abnormalities. Global  longitudinal strain performed but not reported based on interpreter judgement due to suboptimal tracking. 3D ejection fraction reviewed and evaluated as part of the interpretation. Alternate measurement of EF is felt to be most reflective of LV function. The left ventricular internal cavity size was normal in size. There is no left ventricular hypertrophy. Left ventricular diastolic parameters are consistent with Grade I diastolic dysfunction (impaired relaxation). Right Ventricle: The right ventricular size is normal. No increase in right ventricular wall thickness. Right ventricular systolic function is normal. There is normal pulmonary artery systolic pressure. The tricuspid regurgitant velocity is 2.43 m/s, and  with an assumed right atrial pressure of 3 mmHg, the estimated right ventricular systolic pressure is 26.6 mmHg. Left Atrium: Left atrial size was normal in size. Right Atrium: Right atrial size was normal in size. Pericardium: There is no evidence of pericardial effusion. Mitral Valve: The mitral valve is grossly normal. Trivial mitral valve regurgitation. No evidence of mitral valve stenosis.  Tricuspid Valve: The tricuspid valve is grossly normal. Tricuspid valve regurgitation is mild . No evidence of tricuspid stenosis. Aortic Valve: The aortic valve is tricuspid. Aortic valve regurgitation is not visualized. No aortic stenosis is present. Pulmonic Valve: The pulmonic valve was grossly normal. Pulmonic valve regurgitation is not visualized. No evidence of pulmonic stenosis. Aorta: The aortic root and ascending aorta are structurally normal, with no evidence of dilitation. Venous: The inferior vena cava is  normal in size with greater than 50% respiratory variability, suggesting right atrial pressure of 3 mmHg. IAS/Shunts: The atrial septum is grossly normal.  LEFT VENTRICLE PLAX 2D                        Biplane EF (MOD) LVIDd:         4.10 cm         LV Biplane EF:   Left LVIDs:         2.70 cm                          ventricular LV PW:         0.80 cm                          ejection LV IVS:        0.90 cm                          fraction by LVOT diam:     2.10 cm                          2D MOD LV SV:         60                               biplane is LV SV Index:   33                               55.7 %. LVOT Area:     3.46 cm                                Diastology                                LV e' medial:    6.53 cm/s LV Volumes (MOD)               LV E/e' medial:  7.9 LV vol d, MOD    111.0 ml      LV e' lateral:   7.29 cm/s A2C:                           LV E/e' lateral: 7.1 LV vol d, MOD    115.0 ml A4C: LV vol s, MOD    47.8 ml A2C: LV vol s, MOD    50.6 ml       3D Volume EF: A4C:                           3D EF:        51 % LV SV MOD A2C:   63.2 ml       LV EDV:       114 ml LV SV MOD A4C:   115.0 ml      LV ESV:       56 ml LV SV MOD BP:    65.2 ml       LV  SV:        58 ml RIGHT VENTRICLE             IVC RV S prime:     11.90 cm/s  IVC diam: 1.70 cm TAPSE (M-mode): 2.2 cm LEFT ATRIUM             Index        RIGHT ATRIUM           Index LA diam:        3.50 cm 1.95 cm/m   RA  Area:     11.25 cm LA Vol (A2C):   35.0 ml 19.49 ml/m  RA Volume:   23.75 ml  13.23 ml/m LA Vol (A4C):   31.5 ml 17.54 ml/m LA Biplane Vol: 33.7 ml 18.77 ml/m  AORTIC VALVE LVOT Vmax:   95.50 cm/s LVOT Vmean:  63.800 cm/s LVOT VTI:    0.173 m  AORTA Ao Root diam: 3.10 cm Ao Asc diam:  3.30 cm MITRAL VALVE               TRICUSPID VALVE MV Area (PHT): 3.89 cm    TR Peak grad:   23.6 mmHg MV Decel Time: 195 msec    TR Vmax:        243.00 cm/s MV E velocity: 51.80 cm/s MV A velocity: 63.00 cm/s  SHUNTS MV E/A ratio:  0.82        Systemic VTI:  0.17 m                            Systemic Diam: 2.10 cm Darryle Decent MD Electronically signed by Darryle Decent MD Signature Date/Time: 06/07/2024/11:32:26 AM    Final      Subjective: - no chest pain, shortness of breath, no abdominal pain, nausea or vomiting.   Discharge Exam: BP 111/86 (BP Location: Right Arm)   Pulse 93   Temp (!) 97 F (36.1 C)   Resp 16   Ht 5' 5 (1.651 m)   Wt 75.8 kg   SpO2 98%   BMI 27.81 kg/m   General: Pt is alert, awake, not in acute distress Cardiovascular: RRR, S1/S2 +, no rubs, no gallops Respiratory: CTA bilaterally, no wheezing, no rhonchi Abdominal: Soft, NT, ND, bowel sounds + Extremities: no edema, no cyanosis    The results of significant diagnostics from this hospitalization (including imaging, microbiology, ancillary and laboratory) are listed below for reference.     Microbiology: No results found for this or any previous visit (from the past 240 hours).   Labs: Basic Metabolic Panel: Recent Labs  Lab 06/13/24 1818 06/13/24 2046 06/14/24 0354 06/15/24 0216 06/16/24 0350 06/17/24 0651  NA 139  --  139 139 138 138  K 3.8  --  4.1 4.2 4.2 4.7  CL 103  --  103 104 103 102  CO2 26  --  25 27 25 27   GLUCOSE 155*  --  118* 172* 127* 142*  BUN 22*  --  21* 25* 31* 28*  CREATININE 1.10*  --  1.09* 1.21* 1.50* 1.08*  CALCIUM  8.9  --  8.8* 9.5 9.2 9.2  MG  --  2.6*  --   --  2.6*  --     Liver Function Tests: Recent Labs  Lab 06/13/24 1818 06/14/24 0354 06/15/24 0216 06/16/24 0350 06/17/24 0651  AST 85* 859* 747* 186* 102*  ALT 71* 252* 523* 324* 230*  ALKPHOS 178*  326* 513* 423* 397*  BILITOT 0.4 0.8 0.6 0.4 0.5  PROT 6.7 6.5 6.9 6.5 6.9  ALBUMIN 3.8 3.7 3.9 3.6 3.8   CBC: Recent Labs  Lab 06/13/24 1818 06/14/24 0354 06/16/24 0350  WBC 7.2 9.6 6.4  NEUTROABS 3.5 5.9  --   HGB 12.5 12.8 13.3  HCT 39.1 38.4 40.8  MCV 93.5 91.0 93.8  PLT 203 186 186   CBG: Recent Labs  Lab 06/17/24 0754 06/17/24 1123 06/17/24 1643 06/17/24 1943 06/18/24 0741  GLUCAP 153* 144* 115* 195* 140*   Hgb A1c No results for input(s): HGBA1C in the last 72 hours. Lipid Profile No results for input(s): CHOL, HDL, LDLCALC, TRIG, CHOLHDL, LDLDIRECT in the last 72 hours. Thyroid  function studies No results for input(s): TSH, T4TOTAL, T3FREE, THYROIDAB in the last 72 hours.  Invalid input(s): FREET3 Urinalysis    Component Value Date/Time   COLORURINE YELLOW 08/01/2018 1049   APPEARANCEUR HAZY (A) 08/01/2018 1049   LABSPEC 1.010 08/01/2018 1049   PHURINE 5.0 08/01/2018 1049   GLUCOSEU 50 (A) 08/01/2018 1049   HGBUR SMALL (A) 08/01/2018 1049   BILIRUBINUR NEGATIVE 08/01/2018 1049   KETONESUR NEGATIVE 08/01/2018 1049   PROTEINUR NEGATIVE 08/01/2018 1049   UROBILINOGEN 0.2 05/02/2014 1015   NITRITE NEGATIVE 08/01/2018 1049   LEUKOCYTESUR SMALL (A) 08/01/2018 1049    FURTHER DISCHARGE INSTRUCTIONS:   Get Medicines reviewed and adjusted: Please take all your medications with you for your next visit with your Primary MD   Laboratory/radiological data: Please request your Primary MD to go over all hospital tests and procedure/radiological results at the follow up, please ask your Primary MD to get all Hospital records sent to his/her office.   In some cases, they will be blood work, cultures and biopsy results pending at the time of your  discharge. Please request that your primary care M.D. goes through all the records of your hospital data and follows up on these results.   Also Note the following: If you experience worsening of your admission symptoms, develop shortness of breath, life threatening emergency, suicidal or homicidal thoughts you must seek medical attention immediately by calling 911 or calling your MD immediately  if symptoms less severe.   You must read complete instructions/literature along with all the possible adverse reactions/side effects for all the Medicines you take and that have been prescribed to you. Take any new Medicines after you have completely understood and accpet all the possible adverse reactions/side effects.    Do not drive when taking Pain medications or sleeping medications (Benzodaizepines)   Do not take more than prescribed Pain, Sleep and Anxiety Medications. It is not advisable to combine anxiety,sleep and pain medications without talking with your primary care practitioner   Special Instructions: If you have smoked or chewed Tobacco  in the last 2 yrs please stop smoking, stop any regular Alcohol  and or any Recreational drug use.   Wear Seat belts while driving.   Please note: You were cared for by a hospitalist during your hospital stay. Once you are discharged, your primary care physician will handle any further medical issues. Please note that NO REFILLS for any discharge medications will be authorized once you are discharged, as it is imperative that you return to your primary care physician (or establish a relationship with a primary care physician if you do not have one) for your post hospital discharge needs so that they can reassess your need for medications and monitor your lab values.  Time coordinating discharge: 35 minutes  SIGNED:  Nilda Fendt, MD, PhD 06/18/2024, 10:01 AM   "

## 2024-06-22 ENCOUNTER — Telehealth (HOSPITAL_COMMUNITY): Payer: Self-pay | Admitting: Cardiology

## 2024-06-22 NOTE — Telephone Encounter (Signed)
 Patient called to report she was seen in the hospital and was told to stop all heart medications  Pt would like confirmation its ok to stop[ meds  Per discharge summary advised to stop clonidine , entresto , and potassium  No home vitals Declined add on 1/8  HFU 07/04/24    Please  advise

## 2024-06-23 ENCOUNTER — Emergency Department (HOSPITAL_COMMUNITY)
Admission: EM | Admit: 2024-06-23 | Discharge: 2024-06-24 | Disposition: A | Discharge status: Home / Self Care | Attending: Emergency Medicine | Admitting: Emergency Medicine

## 2024-06-23 ENCOUNTER — Other Ambulatory Visit: Payer: Self-pay

## 2024-06-23 DIAGNOSIS — R519 Headache, unspecified: Secondary | ICD-10-CM

## 2024-06-23 DIAGNOSIS — I5020 Unspecified systolic (congestive) heart failure: Secondary | ICD-10-CM | POA: Diagnosis not present

## 2024-06-23 DIAGNOSIS — R110 Nausea: Secondary | ICD-10-CM | POA: Diagnosis not present

## 2024-06-23 DIAGNOSIS — M797 Fibromyalgia: Secondary | ICD-10-CM | POA: Insufficient documentation

## 2024-06-23 DIAGNOSIS — Z7984 Long term (current) use of oral hypoglycemic drugs: Secondary | ICD-10-CM | POA: Diagnosis not present

## 2024-06-23 DIAGNOSIS — R600 Localized edema: Secondary | ICD-10-CM | POA: Insufficient documentation

## 2024-06-23 DIAGNOSIS — R079 Chest pain, unspecified: Secondary | ICD-10-CM | POA: Insufficient documentation

## 2024-06-23 DIAGNOSIS — E119 Type 2 diabetes mellitus without complications: Secondary | ICD-10-CM | POA: Diagnosis not present

## 2024-06-23 DIAGNOSIS — R5381 Other malaise: Secondary | ICD-10-CM

## 2024-06-23 LAB — CBC WITH DIFFERENTIAL/PLATELET
Abs Immature Granulocytes: 0.01 K/uL (ref 0.00–0.07)
Basophils Absolute: 0 K/uL (ref 0.0–0.1)
Basophils Relative: 0 %
Eosinophils Absolute: 0.3 K/uL (ref 0.0–0.5)
Eosinophils Relative: 4 %
HCT: 38.2 % (ref 36.0–46.0)
Hemoglobin: 12.3 g/dL (ref 12.0–15.0)
Immature Granulocytes: 0 %
Lymphocytes Relative: 43 %
Lymphs Abs: 3 K/uL (ref 0.7–4.0)
MCH: 30.1 pg (ref 26.0–34.0)
MCHC: 32.2 g/dL (ref 30.0–36.0)
MCV: 93.4 fL (ref 80.0–100.0)
Monocytes Absolute: 0.7 K/uL (ref 0.1–1.0)
Monocytes Relative: 10 %
Neutro Abs: 3 K/uL (ref 1.7–7.7)
Neutrophils Relative %: 43 %
Platelets: 199 K/uL (ref 150–400)
RBC: 4.09 MIL/uL (ref 3.87–5.11)
RDW: 12.2 % (ref 11.5–15.5)
WBC: 7.1 K/uL (ref 4.0–10.5)
nRBC: 0 % (ref 0.0–0.2)

## 2024-06-23 LAB — URINALYSIS, ROUTINE W REFLEX MICROSCOPIC
Bilirubin Urine: NEGATIVE
Glucose, UA: >=500 mg/dL — AB
Hgb urine dipstick: NEGATIVE
Ketones, ur: NEGATIVE mg/dL
Leukocytes,Ua: NEGATIVE
Nitrite: NEGATIVE
Protein, ur: NEGATIVE mg/dL
Specific Gravity, Urine: 1.01 (ref 1.005–1.030)
pH: 5 (ref 5.0–8.0)

## 2024-06-23 MED ORDER — ACETAMINOPHEN 500 MG PO TABS
1000.0000 mg | ORAL_TABLET | Freq: Once | ORAL | Status: AC
  Administered 2024-06-23: 1000 mg via ORAL
  Filled 2024-06-23: qty 2

## 2024-06-23 MED ORDER — ONDANSETRON 4 MG PO TBDP
4.0000 mg | ORAL_TABLET | Freq: Once | ORAL | Status: AC
  Administered 2024-06-23: 4 mg via ORAL
  Filled 2024-06-23: qty 1

## 2024-06-23 NOTE — ED Provider Triage Note (Signed)
 Emergency Medicine Provider Triage Evaluation Note  [name] , a 54 y.o. female  was evaluated in triage.  Pt complains of nausea, decreased appetite. Was admitted on 06/13/2024 for CP. Also endorses headache.   Review of Systems  Positive: Nausea, decreased appetite   Negative: CP, SOB, abd pain, fevers  Physical Exam  BP 130/81 (BP Location: Right Arm)   Pulse 78   Temp 97.6 F (36.4 C)   Resp 20   SpO2 98%  Gen:   Awake, no distress   Resp:  Normal effort clear lungs  MSK:   Moves extremities without difficulty  Other:  Abd soft nontender   Medical Decision Making  Medically screening exam initiated at 11:26 PM.  Appropriate orders placed.  [name] was informed that the remainder of the evaluation will be completed by another provider, this initial triage assessment does not replace that evaluation, and the importance of remaining in the ED until their evaluation is complete.  Labs, zofran  and Tylenol     Linda Dubois, PA-C 06/23/24 2328

## 2024-06-23 NOTE — ED Triage Notes (Signed)
 Patient reports headache with nausea , fatigue and poor appetite this week .

## 2024-06-24 ENCOUNTER — Emergency Department (HOSPITAL_COMMUNITY)

## 2024-06-24 LAB — COMPREHENSIVE METABOLIC PANEL WITH GFR
ALT: 68 U/L — ABNORMAL HIGH (ref 0–44)
AST: 48 U/L — ABNORMAL HIGH (ref 15–41)
Albumin: 3.9 g/dL (ref 3.5–5.0)
Alkaline Phosphatase: 244 U/L — ABNORMAL HIGH (ref 38–126)
Anion gap: 10 (ref 5–15)
BUN: 35 mg/dL — ABNORMAL HIGH (ref 6–20)
CO2: 26 mmol/L (ref 22–32)
Calcium: 8.6 mg/dL — ABNORMAL LOW (ref 8.9–10.3)
Chloride: 103 mmol/L (ref 98–111)
Creatinine, Ser: 1.36 mg/dL — ABNORMAL HIGH (ref 0.44–1.00)
GFR, Estimated: 46 mL/min — ABNORMAL LOW
Glucose, Bld: 107 mg/dL — ABNORMAL HIGH (ref 70–99)
Potassium: 4 mmol/L (ref 3.5–5.1)
Sodium: 138 mmol/L (ref 135–145)
Total Bilirubin: 0.4 mg/dL (ref 0.0–1.2)
Total Protein: 6.9 g/dL (ref 6.5–8.1)

## 2024-06-24 LAB — PHOSPHORUS: Phosphorus: 4.2 mg/dL (ref 2.5–4.6)

## 2024-06-24 LAB — MAGNESIUM: Magnesium: 2.7 mg/dL — ABNORMAL HIGH (ref 1.7–2.4)

## 2024-06-24 LAB — RESP PANEL BY RT-PCR (RSV, FLU A&B, COVID)  RVPGX2
Influenza A by PCR: NEGATIVE
Influenza B by PCR: NEGATIVE
Resp Syncytial Virus by PCR: NEGATIVE
SARS Coronavirus 2 by RT PCR: NEGATIVE

## 2024-06-24 LAB — LIPASE, BLOOD: Lipase: 45 U/L (ref 11–51)

## 2024-06-24 LAB — GAMMA GT: GGT: 154 U/L — ABNORMAL HIGH (ref 7–50)

## 2024-06-24 LAB — TSH: TSH: 1.82 u[IU]/mL (ref 0.350–4.500)

## 2024-06-24 LAB — MONONUCLEOSIS SCREEN: Mono Screen: NEGATIVE

## 2024-06-24 MED ORDER — ONDANSETRON HCL 4 MG/2ML IJ SOLN
4.0000 mg | Freq: Once | INTRAMUSCULAR | Status: AC
  Administered 2024-06-24: 4 mg via INTRAVENOUS
  Filled 2024-06-24: qty 2

## 2024-06-24 MED ORDER — OXYCODONE HCL 5 MG PO TABS
10.0000 mg | ORAL_TABLET | Freq: Once | ORAL | Status: AC
  Administered 2024-06-24: 10 mg via ORAL
  Filled 2024-06-24: qty 2

## 2024-06-24 MED ORDER — LACTATED RINGERS IV BOLUS
1000.0000 mL | Freq: Once | INTRAVENOUS | Status: AC
  Administered 2024-06-24: 1000 mL via INTRAVENOUS

## 2024-06-24 NOTE — ED Notes (Signed)
 Pt. Requesting pain meds for chronic back pain; rates pain 8/10; EDP notified

## 2024-06-24 NOTE — Discharge Instructions (Signed)
 1.  Continue to hydrate at home and take Zofran  as needed. 2.  Continue to work with your pain management specialist for pain control. 3.  Follow-up with your family doctor for monitoring of your medical conditions. 4.  Return to the emergency department if you have new worsening or concerning symptoms.

## 2024-06-24 NOTE — ED Notes (Signed)
 EDP at [facility]

## 2024-06-24 NOTE — ED Notes (Signed)
 Gave pt. Some orange juice and turkey sandwich per pt. Request

## 2024-06-24 NOTE — ED Provider Notes (Signed)
 "  EMERGENCY DEPARTMENT AT [facility] Provider Note   CSN: [id number] Arrival date & time: 06/23/24  2303     Patient presents with: Headache and Nausea   [name] is a 54 y.o. female.   HPI Patient was recent discharged from the hospital 1\4\26.  At that time, she reports she had chest pain and nausea.  She was noted to have elevated LFTs and had diagnostic evaluation including GI consult which did not identify a cause.  The patient is diabetic at baseline.  She reports that she has not felt well since her discharge.  She reports she just really feels sick.  Patient reports that she has been nauseated continually.  She denies she has had active vomiting.  She has not been tolerating oral intake well.  She reports she also has had a generalized frontal headache which was gradual on onset for the past 3 days.  No associated rash or fever.  She reports she has had kind of a raspy voice since her hospitalization but is not having severe sore throat.  She is not having any productive cough.  She denies lower extremity swelling or pain.  Patient was taken off of Entresto  and and clonidine  due to persistent hypotension during her hospitalization.  Her furosemide  dosing was cut in half.  Patient reports that she was seen at her PCP office yesterday.  She reports her blood pressures were normal at that time.  To her knowledge since stopping blood pressure medications her blood pressures have not been going up.  Reports she did have an episode of CHF previously and developed water  blisters on her legs which have left permanent scars but does not feel that she has had swelling recently.  Her known history is for nonischemic cardiomyopathy with chronic CHF recent EF improved to 55 to 60% from prior values of 20 to 25%.    Prior to Admission medications  Medication Sig Start Date End Date Taking? Authorizing Provider  albuterol  (VENTOLIN  HFA) 108 (90 Base) MCG/ACT inhaler Inhale 2  puffs into the lungs every 4 (four) hours as needed for wheezing or shortness of breath.    [provider]  Biotin  (BIOTIN  EXTRA STRENGTH) 10 MG CAPS Take 10 mg by mouth in the morning.    [provider]  Cholecalciferol (VITAMIN D3) 5000 units TABS Take 5,000 Units by mouth in the morning.    [provider]  DULoxetine  (CYMBALTA ) 30 MG capsule Take 30 mg by mouth daily.    [provider]  DULoxetine  (CYMBALTA ) 60 MG capsule Take 60 mg by mouth daily.    [provider]  empagliflozin  (JARDIANCE ) 25 MG TABS tablet Take 25 mg by mouth in the morning.    [provider]  famotidine  (PEPCID ) 20 MG tablet Take 20 mg by mouth 2 (two) times daily.    [provider]  fluticasone  (FLONASE ) 50 MCG/ACT nasal spray Place 2 sprays into both nostrils daily. 03/07/13   [provider]  metoprolol  succinate (TOPROL -XL) 25 MG 24 hr tablet Take 25 mg by mouth in the morning.    [provider]  montelukast  (SINGULAIR ) 10 MG tablet Take 10 mg by mouth daily. 04/29/23   [provider]  MOUNJARO 15 MG/0.5ML Pen Inject 15 mg into the skin every Saturday. 12/25/23   [provider]  naloxegol  oxalate (MOVANTIK ) 12.5 MG TABS tablet Take 1 tablet (12.5 mg total) by mouth daily. 06/18/24 07/18/24  [name], [name],  MD  ondansetron  (ZOFRAN ) 4 MG tablet TAKE 1 TABLET BY MOUTH EVERY 8 HOURS AS NEEDED FOR NAUSEA AND VOMITING 12/14/18   Linda Ezra RAMAN, MD  oxyCODONE -acetaminophen  (PERCOCET) 10-325 MG per tablet Take 0.5-1 tablets by mouth every 4 (four) hours as needed for pain.    [provider]  pregabalin  (LYRICA ) 150 MG capsule Take 150 mg by mouth 3 (three) times daily.    [provider]  promethazine  (PHENERGAN ) 25 MG tablet promethazine  25 mg tablet  TAKE 1/2 TO 1 TABLET BY MOUTH EVERY 6 HOURS AS NEEDED FOR NAUSEA    [provider]  rosuvastatin  (CRESTOR ) 10 MG tablet Take 1 tablet (10 mg  total) by mouth daily. 10/23/20   Linda Ezra RAMAN, MD  solifenacin (VESICARE) 5 MG tablet solifenacin 5 mg tablet  TAKE 1 (ONE) TABLET DAILY FOR BLADDER CONTROL    [provider]  spironolactone  (ALDACTONE ) 25 MG tablet Take 0.5 tablets (12.5 mg total) by mouth daily. 06/18/24   [name], [name], MD  torsemide  (DEMADEX ) 20 MG tablet Take 2 tablets (40 mg total) by mouth daily. 06/18/24   [name], [name], MD    Allergies: Imitrex  [sumatriptan ], Lamictal [lamotrigine], and Metformin and related    Review of Systems  Updated Vital Signs BP 127/86 (BP Location: Right Arm)   Pulse 88   Temp 97.7 F (36.5 C) (Oral)   Resp 18   SpO2 100%   Physical Exam Constitutional:      Comments: Alert.  Nontoxic.  No respiratory distress.  Appears mildly uncomfortable.  Mental status clear.  HENT:     Head: Normocephalic and atraumatic.     Right Ear: External ear normal.     Left Ear: External ear normal.     Nose: Nose normal.     Mouth/Throat:     Mouth: Mucous membranes are moist.     Pharynx: Oropharynx is clear. No oropharyngeal exudate or posterior oropharyngeal erythema.     Comments: Posterior oropharynx is widely patent.  Patient does not have any erythema or exudate or swelling. Eyes:     Extraocular Movements: Extraocular movements intact.     Conjunctiva/sclera: Conjunctivae normal.     Pupils: Pupils are equal, round, and reactive to light.  Cardiovascular:     Rate and Rhythm: Normal rate and regular rhythm.     Heart sounds: No murmur heard. Pulmonary:     Effort: Pulmonary effort is normal.     Breath sounds: Normal breath sounds.  Abdominal:     General: There is no distension.     Palpations: Abdomen is soft.     Tenderness: There is no abdominal tenderness. There is no guarding.  Musculoskeletal:     Cervical back: Neck supple.     Comments: Patient has about 1+ edema bilateral lower extremities.  Calves are nontender.  Feet are warm and dry.  Distal pulses  are 2+ and symmetric.  Patient has multiple old hyperpigmentation's of both lower extremities but no active wounds.  Lymphadenopathy:     Cervical: No cervical adenopathy.  Skin:    General: Skin is warm and dry.  Neurological:     General: No focal deficit present.     Mental Status: She is oriented to person, place, and time.     Cranial Nerves: No cranial nerve deficit.     Motor: No weakness.     Coordination: Coordination normal.  Psychiatric:        Mood and Affect: Mood  normal.     (all labs ordered are listed, but only abnormal results are displayed) Labs Reviewed  COMPREHENSIVE METABOLIC PANEL WITH GFR - Abnormal; Notable for the following components:      Result Value   Glucose, Bld 107 (*)    BUN 35 (*)    Creatinine, Ser 1.36 (*)    Calcium  8.6 (*)    AST 48 (*)    ALT 68 (*)    Alkaline Phosphatase 244 (*)    GFR, Estimated 46 (*)    All other components within normal limits  URINALYSIS, ROUTINE W REFLEX MICROSCOPIC - Abnormal; Notable for the following components:   Glucose, UA >=500 (*)    Bacteria, UA RARE (*)    All other components within normal limits  GAMMA GT - Abnormal; Notable for the following components:   GGT 154 (*)    All other components within normal limits  MAGNESIUM  - Abnormal; Notable for the following components:   Magnesium  2.7 (*)    All other components within normal limits  RESP PANEL BY RT-PCR (RSV, FLU A&B, COVID)  RVPGX2  CBC WITH DIFFERENTIAL/PLATELET  LIPASE, BLOOD  PHOSPHORUS  TSH  MONONUCLEOSIS SCREEN  URINE DRUG SCREEN  CALCITRIOL  (1,25 DI-OH VIT D)  PARATHYROID  HORMONE, INTACT (NO CA)    EKG: None  Radiology: CT Head Wo Contrast Result Date: 06/24/2024 CLINICAL DATA:  New onset headache. EXAM: CT HEAD WITHOUT CONTRAST TECHNIQUE: Contiguous axial images were obtained from the base of the skull through the vertex without intravenous contrast. RADIATION DOSE REDUCTION: This exam was performed according to the  departmental dose-optimization program which includes automated exposure control, adjustment of the mA and/or kV according to patient size and/or use of iterative reconstruction technique. COMPARISON:  08/01/2018 FINDINGS: Brain: No evidence of intracranial hemorrhage, acute infarction, hydrocephalus, extra-axial collection, or mass lesion/mass effect. Old right occipital lobe infarct is unchanged in appearance. Vascular:  No hyperdense vessel or other acute findings. Skull: No evidence of fracture or other significant bone abnormality. Sinuses/Orbits:  No acute findings. Other: None. IMPRESSION: No acute intracranial abnormality. Stable old right occipital lobe infarct. Electronically Signed   By: Norleen DELENA Kil [name].D.   On: 06/24/2024 09:08     Procedures   Medications Ordered in the ED  ondansetron  (ZOFRAN -ODT) disintegrating tablet 4 mg (4 mg Oral Given 06/23/24 2332)  acetaminophen  (TYLENOL ) tablet 1,000 mg (1,000 mg Oral Given 06/23/24 2331)  lactated ringers  bolus 1,000 mL (0 mLs Intravenous Stopped 06/24/24 1006)  ondansetron  (ZOFRAN ) injection 4 mg (4 mg Intravenous Given 06/24/24 0904)  oxyCODONE  (Oxy IR/ROXICODONE ) immediate release tablet 10 mg (10 mg Oral Given 06/24/24 0915)                                    Medical Decision Making Amount and/or Complexity of Data Reviewed Labs: ordered. Radiology: ordered.  Risk Prescription drug management.   Patient presents as outlined.  She has complex past medical history with multiple comorbidities.  Patient was recently hospitalized with elevated LFTs.  Etiology not identified.  Patient presents today with malaise, general weakness, frontal headache.  Will proceed with diagnostic evaluation and hydration.  Respiratory panel negative.  Urinalysis negative for infection.  Metabolic panel GFR 46 AST 48 ALT 68 alk phos 244 lipase 45 GGT 154 CBC normal with normal differential.  CT head interpreted by radiology, no acute abnormality.  Patient  was hydrated and given Zofran .  She is feeling improved.  Patient has been able to eat a sandwich and drink.  At this time with lab work improving relative to prior findings that hospitalization, vital signs stable, patient tolerating oral intake, at this time I do feel she stable for discharge and follow-up.  Patient does have PCP, pain management specialist and recently filled her prescription for Zofran .  She denies need for any additional prescriptions or medications at this time.  She understands discharge planning and follow-up and return precautions.     Final diagnoses:  Malaise and fatigue  Nausea  Acute nonintractable headache, unspecified headache type  Fibromyalgia    ED Discharge Orders     None          Armenta Canning, MD 06/24/24 1152  "

## 2024-06-24 NOTE — ED Notes (Signed)
 Patient transported to CT

## 2024-06-25 LAB — CALCITRIOL (1,25 DI-OH VIT D): Vit D, 1,25-Dihydroxy: 79.5 pg/mL (ref 24.8–81.5)

## 2024-06-26 LAB — PARATHYROID HORMONE, INTACT (NO CA): PTH: 84 pg/mL — ABNORMAL HIGH (ref 15–65)

## 2024-06-27 NOTE — Telephone Encounter (Signed)
 Patient advised and verbalized understanding

## 2024-06-29 ENCOUNTER — Other Ambulatory Visit (HOSPITAL_COMMUNITY): Payer: Self-pay | Admitting: Cardiology

## 2024-07-05 ENCOUNTER — Telehealth (HOSPITAL_COMMUNITY): Payer: Self-pay | Admitting: *Deleted

## 2024-07-05 ENCOUNTER — Ambulatory Visit (HOSPITAL_COMMUNITY): Payer: Self-pay | Admitting: Cardiology

## 2024-07-05 ENCOUNTER — Ambulatory Visit (HOSPITAL_COMMUNITY)
Admission: RE | Admit: 2024-07-05 | Discharge: 2024-07-05 | Disposition: A | Discharge status: Home / Self Care | Source: Ambulatory Visit | Attending: Cardiology | Admitting: Cardiology

## 2024-07-05 VITALS — BP 90/50 | HR 92 | Wt 154.8 lb

## 2024-07-05 DIAGNOSIS — I2510 Atherosclerotic heart disease of native coronary artery without angina pectoris: Secondary | ICD-10-CM | POA: Insufficient documentation

## 2024-07-05 DIAGNOSIS — Z7985 Long-term (current) use of injectable non-insulin antidiabetic drugs: Secondary | ICD-10-CM | POA: Insufficient documentation

## 2024-07-05 DIAGNOSIS — Z79899 Other long term (current) drug therapy: Secondary | ICD-10-CM | POA: Insufficient documentation

## 2024-07-05 DIAGNOSIS — I5022 Chronic systolic (congestive) heart failure: Secondary | ICD-10-CM | POA: Insufficient documentation

## 2024-07-05 DIAGNOSIS — E663 Overweight: Secondary | ICD-10-CM | POA: Diagnosis not present

## 2024-07-05 DIAGNOSIS — E782 Mixed hyperlipidemia: Secondary | ICD-10-CM | POA: Diagnosis not present

## 2024-07-05 DIAGNOSIS — Z6825 Body mass index (BMI) 25.0-25.9, adult: Secondary | ICD-10-CM | POA: Insufficient documentation

## 2024-07-05 DIAGNOSIS — R7989 Other specified abnormal findings of blood chemistry: Secondary | ICD-10-CM | POA: Insufficient documentation

## 2024-07-05 DIAGNOSIS — I428 Other cardiomyopathies: Secondary | ICD-10-CM | POA: Diagnosis not present

## 2024-07-05 DIAGNOSIS — Z8619 Personal history of other infectious and parasitic diseases: Secondary | ICD-10-CM | POA: Insufficient documentation

## 2024-07-05 DIAGNOSIS — I110 Hypertensive heart disease with heart failure: Secondary | ICD-10-CM | POA: Diagnosis not present

## 2024-07-05 DIAGNOSIS — G4733 Obstructive sleep apnea (adult) (pediatric): Secondary | ICD-10-CM | POA: Diagnosis not present

## 2024-07-05 LAB — BASIC METABOLIC PANEL WITH GFR
Anion gap: 12 (ref 5–15)
BUN: 24 mg/dL — ABNORMAL HIGH (ref 6–20)
CO2: 27 mmol/L (ref 22–32)
Calcium: 9.3 mg/dL (ref 8.9–10.3)
Chloride: 102 mmol/L (ref 98–111)
Creatinine, Ser: 1.3 mg/dL — ABNORMAL HIGH (ref 0.44–1.00)
GFR, Estimated: 49 mL/min — ABNORMAL LOW
Glucose, Bld: 116 mg/dL — ABNORMAL HIGH (ref 70–99)
Potassium: 3.8 mmol/L (ref 3.5–5.1)
Sodium: 141 mmol/L (ref 135–145)

## 2024-07-05 LAB — LIPID PANEL
Cholesterol: 129 mg/dL (ref 0–200)
HDL: 67 mg/dL
LDL Cholesterol: 48 mg/dL (ref 0–99)
Total CHOL/HDL Ratio: 1.9 ratio
Triglycerides: 71 mg/dL
VLDL: 14 mg/dL (ref 0–40)

## 2024-07-05 LAB — HEPATIC FUNCTION PANEL
ALT: 42 U/L (ref 0–44)
AST: 66 U/L — ABNORMAL HIGH (ref 15–41)
Albumin: 4.2 g/dL (ref 3.5–5.0)
Alkaline Phosphatase: 197 U/L — ABNORMAL HIGH (ref 38–126)
Bilirubin, Direct: 0.2 mg/dL (ref 0.0–0.2)
Indirect Bilirubin: 0.5 mg/dL (ref 0.3–0.9)
Total Bilirubin: 0.7 mg/dL (ref 0.0–1.2)
Total Protein: 7.4 g/dL (ref 6.5–8.1)

## 2024-07-05 LAB — PRO BRAIN NATRIURETIC PEPTIDE: Pro Brain Natriuretic Peptide: <50 pg/mL

## 2024-07-05 MED ORDER — METOPROLOL SUCCINATE ER 25 MG PO TB24: 25.0000 mg | ORAL_TABLET | Freq: Every day | ORAL | Status: AC

## 2024-07-05 MED ORDER — TORSEMIDE 20 MG PO TABS: 40.0000 mg | ORAL_TABLET | Freq: Every day | ORAL | Status: AC | PRN

## 2024-07-05 MED ORDER — SPIRONOLACTONE 25 MG PO TABS: 12.5000 mg | ORAL_TABLET | Freq: Every day | ORAL | Status: AC

## 2024-07-05 NOTE — Patient Instructions (Signed)
 Medication Changes:  TAKE Torsemide  40 mg (2 tabs) ONLY AS NEEDED  Take Spironolactone  and Metoprolol  AT BEDTIME  Lab Work:  Labs done today, your results will be available in MyChart, we will contact you for abnormal readings.   Special Instructions // Education:  Do the following things EVERYDAY: Weigh yourself in the morning before breakfast. Write it down and keep it in a log. Take your medicines as prescribed Eat low salt foods--Limit salt (sodium) to 2000 mg per day.  Stay as active as you can everyday Limit all fluids for the day to less than 2 liters   Follow-Up in: 2 months   At the [facility], you and your health needs are our priority. We have a designated team specialized in the treatment of Heart Failure. This Care Team includes your primary Heart Failure Specialized Cardiologist (physician), Advanced Practice Providers (APPs- Physician Assistants and Nurse Practitioners), and Pharmacist who all work together to provide you with the care you need, when you need it.   You may see any of the following providers on your designated Care Team at your next follow up:  Dr. Toribio Fuel Dr. Ezra Shuck Dr. Odis Brownie Greig Mosses, NP Caffie Shed, [place] [facility] [place], [place] Beckey Coe, NP Jordan Lee, NP Tinnie Redman, PharmD   Please be sure to bring in all your medications bottles to every appointment.   Need to Contact Us :  If you have any questions or concerns before your next appointment please send us  a message through [place] or call our office at [phone].    TO LEAVE A MESSAGE FOR THE NURSE SELECT OPTION 2, PLEASE LEAVE A MESSAGE INCLUDING: YOUR NAME DATE OF BIRTH CALL BACK NUMBER REASON FOR CALL**this is important as we prioritize the call backs  YOU WILL RECEIVE A CALL BACK THE SAME DAY AS LONG AS YOU CALL BEFORE 4:00 PM

## 2024-07-05 NOTE — Telephone Encounter (Signed)
 Called patient per Dr. Rolan with following lab results:  Good LDL. LFTs trending down. Would repeat LFTs in a month to make sure back to normal.  Pt verbalized understanding of same. Repeat lab ordered and scheduled.
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
## 2024-08-02 ENCOUNTER — Ambulatory Visit (HOSPITAL_COMMUNITY)

## 2024-09-01 ENCOUNTER — Ambulatory Visit (HOSPITAL_COMMUNITY)
